# Patient Record
Sex: Male | Born: 1950 | Hispanic: Yes
Health system: Midwestern US, Academic
[De-identification: ages and names within clinical notes are randomized; demographics above are authoritative.]

---

## 2016-10-09 MED ORDER — CLARITHROMYCIN 500 MG PO TAB
500 mg | ORAL_TABLET | Freq: Two times a day (BID) | ORAL | 0 refills | Status: AC
Start: 2016-10-09 — End: ?

## 2016-10-09 MED ORDER — OMEPRAZOLE 20 MG PO CPDR
20 mg | ORAL_CAPSULE | Freq: Two times a day (BID) | ORAL | 0 refills | Status: AC
Start: 2016-10-09 — End: ?

## 2016-10-09 MED ORDER — AMOXICILLIN 500 MG PO CAP
1000 mg | ORAL_CAPSULE | Freq: Two times a day (BID) | ORAL | 0 refills | 7.00000 days | Status: AC
Start: 2016-10-09 — End: ?

## 2016-10-29 MED ORDER — OMEPRAZOLE 20 MG PO CPDR
ORAL_CAPSULE | Freq: Two times a day (BID) | 0 refills | Status: DC
Start: 2016-10-29 — End: 2016-10-30

## 2016-10-30 MED ORDER — OMEPRAZOLE 20 MG PO CPDR
20 mg | ORAL_CAPSULE | Freq: Every day | ORAL | 5 refills | Status: DC
Start: 2016-10-30 — End: 2018-12-31

## 2016-10-30 MED ORDER — SODIUM CHLORIDE 0.9 % IV SOLP
INTRAVENOUS | 0 refills | Status: CN
Start: 2016-10-30 — End: ?

## 2016-11-09 ENCOUNTER — Encounter: Admit: 2016-11-09 | Discharge: 2016-11-09 | Payer: MEDICARE

## 2016-11-09 ENCOUNTER — Ambulatory Visit: Admit: 2016-11-09 | Discharge: 2016-11-09 | Payer: MEDICARE

## 2016-11-09 DIAGNOSIS — W19XXXA Unspecified fall, initial encounter: ICD-10-CM

## 2016-11-09 DIAGNOSIS — E119 Type 2 diabetes mellitus without complications: Principal | ICD-10-CM

## 2016-11-09 DIAGNOSIS — H269 Unspecified cataract: ICD-10-CM

## 2016-11-09 DIAGNOSIS — E785 Hyperlipidemia, unspecified: ICD-10-CM

## 2016-11-09 DIAGNOSIS — E1142 Type 2 diabetes mellitus with diabetic polyneuropathy: Principal | ICD-10-CM

## 2016-11-09 DIAGNOSIS — M1990 Unspecified osteoarthritis, unspecified site: ICD-10-CM

## 2016-11-09 DIAGNOSIS — I10 Essential (primary) hypertension: ICD-10-CM

## 2016-11-09 NOTE — Progress Notes
Date of Service: 11/09/2016     Subjective:             Kevin Obrien is a 66 y.o. male presented to the diabetes clinic today for ongoing management of uncontrolled type 2 diabetes.  He follows [facility], from which he gets his insulin at a cheaper price.  His other medical problems include history of hypertension, hyperlipidemia and benign prostatic hypertrophy.     He was last seen by Dr Christianne Dolin 3 mo ago.       History of Present Illness    Type 2 diabetes mellitus  Diabetes was diagnosed around 2008.    He is currently on Lantus 60 units daily at bedtime, NovoLog 18 units with meals.  He previously was on metformin, however, stopped due to stomach upset as well as diarrhea.  Left side abd pain.  This keeps him up at night, and he is     He did not bring glucose meter for review today.    He has not been doing exercise due to neuropathy.  He has history of coronary artery disease status post stenting 2015, he denies strokes or peripheral vascular disease.    His last eye exam was around 6 months ago.  He does not have any kidney disease.  He reported significant symptoms of neuropathy today, he follows with Dr. Chales Abrahams time in the neurology clinic.    He continues to take Lipitor 40 mg daily, lisinopril 40 mg daily as well as aspirin 81 mg daily.     Review of Systems  A comprehensive 14 point review of system was obtained from patient today and was positive for neuropathy.remiander of review of systems was negative.     Past Medical History:   Diagnosis Date   ??? Accidental fall    ??? Arthritis    ??? Cataract    ??? DM (diabetes mellitus) (HCC)    ??? Hyperlipidemia    ??? Hypertension      Past Surgical History:   Procedure Laterality Date   ??? HX CHOLECYSTECTOMY  09/2015   ??? COLONOSCOPY N/A 04/20/2016    COLONOSCOPY performed by Vertell Novak, MD at [facility]   ??? UPPER GASTROINTESTINAL ENDOSCOPY N/A 04/20/2016    ESOPHAGOGASTRODUODENOSCOPY performed by Vertell Novak, MD at [facility] ??? UPPER GASTROINTESTINAL ENDOSCOPY  04/20/2016    ESOPHAGOGASTRODUODENOSCOPY BIOPSY performed by Vertell Novak, MD at [facility]     Family History   Problem Relation Age of Onset   ??? Diabetes Mother    ??? Diabetes Father    ??? Cancer-Lung Father    ??? Melanoma Neg Hx      Social History     Social History   ??? Marital status: Divorced     Spouse name: N/A   ??? Number of children: 3   ??? Years of education: N/A     Occupational History   ??? Not on file.     Social History Main Topics   ??? Smoking status: Former Smoker     Quit date: 04/12/1971   ??? Smokeless tobacco: Never Used   ??? Alcohol use No   ??? Drug use: No   ??? Sexual activity: No     Other Topics Concern   ??? Not on file     Social History Narrative   ??? No narrative on file     Objective:         ??? amLODIPine (NORVASC) 10 mg tablet Take 1 tablet by mouth  daily. (Patient taking differently: Take 5 mg by mouth daily.)   ??? aspirin 81 mg chewable tablet Take 81 mg by mouth daily.     ??? atorvastatin (LIPITOR) 40 mg tablet Take 40 mg by mouth daily.   ??? capsaicin 0.025 % topical cream Apply on the affected area for neuropathic pain.  Wash hands after application   ??? cholecalciferol (VITAMIN D-3) 1,000 units tablet Take 1,000 Units by mouth daily.   ??? duloxetine DR (CYMBALTA) 30 mg capsule Take 1 capsule by mouth twice daily.   ??? gabapentin (NEURONTIN) 800 mg tablet Take 1 tablet by mouth every 8 hours.   ??? glucose 4 gram chewable tablet Chew 4 tablets by mouth as Needed.   ??? hydrOXYzine (ATARAX) 25 mg tablet Take 25 mg by mouth three times daily as needed for Itching.   ??? ibuprofen (ADVIL) 200 mg tablet Take 200 mg by mouth every 6 hours as needed for Pain. Take with food.   ??? insulin aspart (NOVOLOG FLEXPEN) 100 unit/mL injection PEN Inject 18 Units under the skin three times daily with meals.   ??? insulin glargine (LANTUS) 100 unit/mL injection Inject 60 Units under the skin at bedtime daily. 1 vial contains 10mL ??? insulin pen needles (disposable) (PEN NEEDLE) 31 gauge x 5/16 pen needle Use 1 each as directed as Needed. Use with insulin pen. Can substitute for any approved insulin needle   ??? lisinopril (PRINIVIL, ZESTRIL) 40 mg tablet Take 40 mg by mouth daily.   ??? omeprazole DR(+) (PRILOSEC) 20 mg capsule Take 1 capsule by mouth daily before breakfast.   ??? tamsulosin (FLOMAX) 0.4 mg capsule TAKE ONE CAPSULE BY MOUTH ONCE DAILY.TAKE 30 MINUTES AFTER SAME MEAL. DO NOT CUT, CRUSH, CHEW.     Vitals:    11/09/16 1315   BP: 150/73   Pulse: 88   Weight: 66.5 kg (146 lb 9.6 oz)   Height: 167.6 cm (66)     Body mass index is 23.66 kg/m???.     Physical Exam    Vitals reviewed.  Constitutional:  oriented to person, place, and time. appears well-developed and well-nourished. No distress.   HENT:   Head: Normocephalic and atraumatic.   Mouth/Throat: Oropharynx is clear and moist.   Eyes: Conjunctivae normal   Neck: Normal range of motion. Neck supple.   Cardiovascular: Normal rate  Pulmonary/Chest: Effort normal   Musculoskeletal: exhibits no edema.   Neurological: alert and oriented to person, place, and time. Neuro exam is grossly unremarkable   Skin is warm and dry. No rash noted. No erythema.   Psychiatric: has a normal mood and affect. behavior is normal. Judgment and thought content normal.           Comprehensive Metabolic Profile    Lab Results   Component Value Date/Time    NA 134 (L) 06/20/2016 01:35 PM    K 4.0 06/20/2016 01:35 PM    CL 98 06/20/2016 01:35 PM    CO2 27 06/20/2016 01:35 PM    GAP 9 01/23/2016 02:55 PM    BUN 12 01/23/2016 02:55 PM    CR 0.89 01/23/2016 02:55 PM    GLU 288 (H) 01/23/2016 02:55 PM    Lab Results   Component Value Date/Time    CA 10.0 01/23/2016 02:55 PM    ALBUMIN 4.4 01/23/2016 02:55 PM    TOTPROT 7.3 01/23/2016 02:55 PM    ALKPHOS 69 01/23/2016 02:55 PM    AST 31 01/23/2016 02:55 PM  ALT 59 (H) 01/23/2016 02:55 PM    TOTBILI 0.7 01/23/2016 02:55 PM    GFR >60 01/23/2016 02:55 PM GFRAA >60 01/23/2016 02:55 PM        No results found for: MCALB24   Microalbumin/CR ratio Urine   Date Value Ref Range Status   09/14/2011 10.61 MG/GRAM Final     Microalbumin, Random   Date Value Ref Range Status   09/14/2011 7.0 <19 MCG/ML Final       Lab Results   Component Value Date    CHOL 159 09/01/2015    TRIG 299 (H) 09/01/2015    HDL 27 (L) 09/01/2015    LDL 84 09/01/2015    VLDL 60 09/01/2015    NONHDLCHOL 132 09/01/2015        TSH   Date Value Ref Range Status   09/01/2015 0.851 0.35 - 5.00 MCU/ML Final       Hemoglobin A1C   Date Value Ref Range Status   09/01/2015 12.4 (H) 4.0 - 6.0 % Final     Comment:     The ADA recommends that most patients with type 1 and type 2 diabetes maintain   an A1c level <7%.       Poc Hemoglobin A1C   Date Value Ref Range Status   11/12/2016 11.9  Final   05/24/2016 9.2  Final   02/17/2016 7.9  Final             Assessment and Plan:    Diabetes mellitus type 2,  Un- controlled   ??? Hemoglobin A1c was 11.9 today, was 9.2% in Dec 2017, Target A1c less than or equal to 7% without hypoglycemia.  ??? His blood glucose was elevated today 372   ??? He is currently on Lantus 60 units daily at bedtime, NovoLog 18 units with meals.    Plan :   ??? Increase to Lantus 34 units BID  ??? Stressed the importance of taking NovoLog to 18 units with meals,and if he continues to have prandial hyperglycemia to increase further to 20 units with meals.   Check glucose 4 times daily  Meet with educator in July, as he will likely need to make further adjustments.            Diabetic complication assessment:   ??? Eye exam : 6 months ago  ??? Urine microalbumin/Cr was no in 2013, ordered in the past but he has not completed  ??? Annual labs (electrolytes and renal function): Up-to-date normal renal function in Jan 2018.  ???  Thyroid function testing : Normal March 2017.    Neuropathy  ??? Gabapentin 800 mg TID  ??? Cymbalta 30 mg twice daily that was prescribed by his neurologist Dr. Chales Abrahams in Jan Hyperlipidemia   ??? Had lipid profile in March 2017, that was consistent with hypertriglyceridemia and low HDL which will improve with better diabetes control.   ??? Continue current Lipitor dose 40 mg daily    Hypertension   ??? Stable today   ??? He is on ACEI. Lisinopril 40 mg     Return to clinic in 3 months      Total time 25 minutes.  Estimated counseling time 15 minutes.  Counseled patient regarding diabetes management.      Orders Placed This Encounter   ??? POC HEMOGLOBIN A1C   ??? POC GLUCOSE QUANTITATIVE BLOOD     Patient Instructions     Goals we discussed today:     Novolog 18 units with  each meal    Take Lantus 34 units twice daily    Check blood sugar 4 times daily    Please contact [facility] for any questions or abnormal glucose values (above 300 or less than 70 repeatedly).  [phone].  My nurse can be reached at [phone].      Health goals for a person with diabetes to avoid complications are:    Check your FingerStick blood glucose:  Each time you take medications for your glucose, goals are: Fasting and pre-meal glucose:  70-130 mg/dl, Bedtime blood glucose: 90-180 mg/dl    LDL Cholesterol:  <295 mg/dl, Triglycerides: <621 mg/dl, HDL >30 for men, >86 for women Blood pressure:  Less than 130/90 mm HG  Maintain a Healthy Weight , Exercise: 30 minutes 5 times per week    Foot care:  Take good care of your feet.  Never cut your nails close to the tips of the toes. Promptly call your doctor if you have an infection, ulcer or cut anywhere on your feet. Wear shoes with a wide toe box and that fit comfortably.  Avoid walking barefoot.     Your recent lab values:    Glucose, POC   Date/Time Value Ref Range Status   08/06/2016 299  Final     Cholesterol   Date/Time Value Ref Range Status   09/01/2015 06:48 AM 159 <200 MG/DL Final     HDL   Date/Time Value Ref Range Status   09/01/2015 06:48 AM 27 (L) >40 MG/DL Final     LDL   Date/Time Value Ref Range Status 09/01/2015 06:48 AM 84 <100 MG/DL Final     Triglycerides   Date/Time Value Ref Range Status   09/01/2015 06:48 AM 299 (H) <150 MG/DL Final          Diabetes Support Group  Monthly DM Support Group facilitated by CDE. There is no fee or  Registration required for attendance. Meets the first Wednesday of each month from 6-7:30pm at the [facility], [address], [place], [place]  [address].    [facility] Education Opportunities    To schedule Diabetes Education please call [phone], Option 3  Contact your insurance company to determine coverage for diabetes education.          Future Appointments  Date Time Provider Department Center   11/24/2016 9:30 AM MRI - HOSPITAL ROOM 2 (1.5T) MR Radiology   12/07/2016 10:30 AM Evelina Bucy, MD [facility] SPINE   12/10/2016 1:30 PM Dustin Flock, RN [facility]   05/06/2017 9:00 AM Tonny Branch, MD [facility]

## 2016-11-13 ENCOUNTER — Encounter: Admit: 2016-11-13 | Discharge: 2016-11-13 | Payer: Medicare Other

## 2016-11-13 ENCOUNTER — Encounter: Admit: 2016-11-13 | Discharge: 2016-11-13 | Payer: MEDICARE

## 2016-11-13 DIAGNOSIS — W19XXXA Unspecified fall, initial encounter: ICD-10-CM

## 2016-11-13 DIAGNOSIS — M1990 Unspecified osteoarthritis, unspecified site: ICD-10-CM

## 2016-11-13 DIAGNOSIS — E785 Hyperlipidemia, unspecified: ICD-10-CM

## 2016-11-13 DIAGNOSIS — E119 Type 2 diabetes mellitus without complications: Principal | ICD-10-CM

## 2016-11-13 DIAGNOSIS — I10 Essential (primary) hypertension: ICD-10-CM

## 2016-11-13 DIAGNOSIS — H269 Unspecified cataract: ICD-10-CM

## 2016-11-14 ENCOUNTER — Encounter: Admit: 2016-11-14 | Discharge: 2016-11-14 | Payer: MEDICARE

## 2016-11-14 NOTE — Telephone Encounter
Call pt and let him know H. pylori breath test negative. See Pain Management 12/07/16 for abdominal pain and continue follow up with Neurology. Call or return to GI prn.

## 2016-11-15 ENCOUNTER — Encounter: Admit: 2016-11-15 | Discharge: 2016-11-15 | Payer: MEDICARE

## 2016-11-15 NOTE — Telephone Encounter
Attempted to contact pt. Voicemail box not set up. Message sent to pt via MyChart.

## 2016-11-23 ENCOUNTER — Encounter: Admit: 2016-11-23 | Discharge: 2016-11-23 | Payer: Medicare Other

## 2016-11-24 ENCOUNTER — Ambulatory Visit: Admit: 2016-11-24 | Discharge: 2016-11-24 | Payer: MEDICARE

## 2016-11-24 DIAGNOSIS — M5417 Radiculopathy, lumbosacral region: Principal | ICD-10-CM

## 2016-12-03 ENCOUNTER — Encounter: Admit: 2016-12-03 | Discharge: 2016-12-03 | Payer: MEDICARE

## 2016-12-07 ENCOUNTER — Encounter: Admit: 2016-12-07 | Discharge: 2016-12-07 | Payer: MEDICARE

## 2016-12-07 DIAGNOSIS — H269 Unspecified cataract: ICD-10-CM

## 2016-12-07 DIAGNOSIS — M1990 Unspecified osteoarthritis, unspecified site: ICD-10-CM

## 2016-12-07 DIAGNOSIS — W19XXXA Unspecified fall, initial encounter: ICD-10-CM

## 2016-12-07 DIAGNOSIS — I10 Essential (primary) hypertension: ICD-10-CM

## 2016-12-07 DIAGNOSIS — E119 Type 2 diabetes mellitus without complications: Principal | ICD-10-CM

## 2016-12-07 DIAGNOSIS — E785 Hyperlipidemia, unspecified: ICD-10-CM

## 2016-12-07 MED ORDER — NORTRIPTYLINE 25 MG PO CAP
75 mg | ORAL_CAPSULE | Freq: Every evening | ORAL | 5 refills | Status: AC
Start: 2016-12-07 — End: 2018-12-31

## 2016-12-07 NOTE — Progress Notes
[facility]  NEW PATIENT HISTORY AND PHYSICAL  Subjective     Chief Complaint: Multifocal pain    HPI: Kevin Obrien is a 66 y.o. male who  has a past medical history of Accidental fall; Arthritis; Cataract; DM (diabetes mellitus) (HCC); Hyperlipidemia; and Hypertension. who now presents for initial consultation.    The pain is in the LUQ of the abdomen. The skin there is hypersensitive. He reports allodynia. He cannot bear his shirt touching that area.   There is also itchiness in the bilateral lateral aspects of the thighs. He states that rubbing alcohol on the area results in transient relief.     Pain started: Mid 2017    Initial inciting injury or event: Follow cholecsystectomy    Numbness/tingling: No    The pain ranges 4-8/10    The pain is described as stabbing, burning.     The pain is exacerbated by contact with fabric, touch, eating.     He has had extensive workup with surgeon and GI that has not yielded an etiology per the patient.     The pain is partially alleviated by reclining in a chair.          PRIOR MEDICATIONS:   Effective      Ineffective  Gabapentin  Cymbalta  Capsaicin oint  NSAID  Acetaminophen    Unable to tolerate      Never  Lyrica  Ami/Nortriptyline  Tizanidine      PRIOR INTERVENTIONS:  No spine surgery or injections  Effective      Ineffective  Exercise, pain limited        Kevin Obrien denies any recent fevers, chills, infection, antibiotics, bowel or bladder incontinence, saddle anesthesia, bleeding issues, or recent anticoagulant.     ROS: All 14 systems reviewed and found to be negative except as above and as follows. +congestion, abd pain, diarrhea, dizziness.     Past Medical History:  Past Medical History:   Diagnosis Date   ??? Accidental fall    ??? Arthritis    ??? Cataract    ??? DM (diabetes mellitus) (HCC)    ??? Hyperlipidemia    ??? Hypertension        Family History:  Family History   Problem Relation Age of Onset   ??? Diabetes Mother ??? Diabetes Father    ??? Cancer-Lung Father    ??? Melanoma Neg Hx        Social History:  Lives in [place], [place].   Social History     Social History   ??? Marital status: Divorced     Spouse name: N/A   ??? Number of children: 3   ??? Years of education: N/A     Occupational History   ??? Not on file.     Social History Main Topics   ??? Smoking status: Former Smoker     Quit date: 04/12/1971   ??? Smokeless tobacco: Never Used   ??? Alcohol use No   ??? Drug use: No   ??? Sexual activity: No     Other Topics Concern   ??? Not on file     Social History Narrative   ??? No narrative on file       Allergies:  No Known Allergies    Medications:    Current Outpatient Prescriptions:   ???  amLODIPine (NORVASC) 10 mg tablet, Take 1 tablet by mouth daily. (Patient taking differently: Take 5 mg by mouth  daily.), Disp: 90 tablet, Rfl: 3  ???  aspirin 81 mg chewable tablet, Take 81 mg by mouth daily.  , Disp: , Rfl:   ???  atorvastatin (LIPITOR) 40 mg tablet, Take 40 mg by mouth daily., Disp: , Rfl:   ???  capsaicin 0.025 % topical cream, Apply on the affected area for neuropathic pain. Wash hands after application, Disp: 21 g, Rfl: 3  ???  cholecalciferol (VITAMIN D-3) 1,000 units tablet, Take 1,000 Units by mouth daily., Disp: , Rfl:   ???  duloxetine DR (CYMBALTA) 30 mg capsule, Take 1 capsule by mouth twice daily., Disp: 60 capsule, Rfl: 3  ???  gabapentin (NEURONTIN) 800 mg tablet, Take 1 tablet by mouth every 8 hours., Disp: 270 tablet, Rfl: 3  ???  glucose 4 gram chewable tablet, Chew 4 tablets by mouth as Needed., Disp: 50 tablet, Rfl: 3  ???  hydrOXYzine (ATARAX) 25 mg tablet, Take 25 mg by mouth three times daily as needed for Itching., Disp: , Rfl:   ???  ibuprofen (ADVIL) 200 mg tablet, Take 200 mg by mouth every 6 hours as needed for Pain. Take with food., Disp: , Rfl:   ???  insulin aspart (NOVOLOG FLEXPEN) 100 unit/mL injection PEN, Inject 18 Units under the skin three times daily with meals., Disp: 3 Package, Rfl: 3 ???  insulin glargine (LANTUS) 100 unit/mL injection, Inject 60 Units under the skin at bedtime daily. 1 vial contains 10mL, Disp: 3 vial, Rfl: 11  ???  insulin pen needles (disposable) (PEN NEEDLE) 31 gauge x 5/16 pen needle, Use 1 each as directed as Needed. Use with insulin pen. Can substitute for any approved insulin needle, Disp: 300 each, Rfl: 3  ???  lisinopril (PRINIVIL, ZESTRIL) 40 mg tablet, Take 40 mg by mouth daily., Disp: , Rfl:   ???  omeprazole DR(+) (PRILOSEC) 20 mg capsule, Take 1 capsule by mouth daily before breakfast., Disp: 30 capsule, Rfl: 5  ???  tamsulosin (FLOMAX) 0.4 mg capsule, TAKE ONE CAPSULE BY MOUTH ONCE DAILY.TAKE 30 MINUTES AFTER SAME MEAL. DO NOT CUT, CRUSH, CHEW., Disp: 90 capsule, Rfl: 3    Physical examination:   BP 143/74 (BP Source: Arm, Left Upper, Patient Position: Sitting)  - Pulse 84  - Resp 16  - Ht 167.6 cm (66)  - Wt 62.6 kg (138 lb)  - SpO2 100%  - BMI 22.27 kg/m???   Pain Score: Eight (RT LUQ )    General: The patient is a well-developed, well nourished 66 y.o. male in no acute distress.   HEENT: Head is normocephalic and atraumatic. Pupils are equal and reactive to light bilaterally.   Cardiac: Based on palpation, pulse appears to be regular rate and rhythm.   Pulmonary: The patient has unlabored respirations and bilateral symmetric chest excursion.   Abdomen: Soft, nontender, and nondistended except for sensitivity and allodynia at the left subcostal margin extending towards the lateral margin. There is no rebound. Mild guarding. +Carnett's sign.   Extremities: No clubbing, cyanosis, or edema.     Neurologic:   The patient is alert and oriented times 3.   Cranial nerves II through XII are intact without any focal deficits.   There is no sensory deficit to light touch or pinprick in the affected areas.     Musculoskeletal:   Gait is normal.    L-Spine   There is no point vertebral tenderness in the midline. There is no paraspinal tenderness. Paraspinal muscle tone is normal.  Facet loading is  negative.  There is no tenderness or radiating pain with palpation over the SI joints, piriformis, or greater trochanteric bursae bilaterally.  ROM with flexion, extension, rotation, and lateral bending is intact.  Strength is equal and adequate bilaterally in the flexors and extensors of the bilateral lower extremities.   Reflexes are 2/4 at the patella and achilles bilaterally.   SLR is negative bilaterally.      MRI L-Spine Results:    Results for orders placed during the hospital encounter of 11/24/16   MRI L-SPINE WO CONTRAST    Narrative MRI L-SPINE WO CONTRAST    Clinical indication: Lumbosacral radiculopathy.    Technique: MRI of the lumbar spine is performed without contrast.    Contrast: None    Comparison: CTA of the abdomen and pelvis from March 01, 2016    Findings:    There is straightening of lumbar lordosis. The vertebral body heights are well-maintained without a suspicious geographic marrow lesion. There is disc degeneration and discogenic endplate changes from L3 through S1. The conus terminates normally. The nerve roots of the cauda equina are normal in appearance. The prevertebral and paraspinal soft tissues are unremarkable.    L1-L2: Normal.    L2-L3: No spinal canal or neural foraminal narrowing.    L3-L4: Symmetrically bulging disc and prominent posterior epidural fat in mild spinal canal and bilateral neural foraminal narrowing.    L4-L5: Symmetrically bulging disc with prominent posterior epidural fat, facet arthropathy, and ligamentum flavum laxity results in moderate spinal canal and right neural foraminal narrowing. There is mild left neural foraminal compromise.    L5-S1: Symmetrically bulging disc and facet arthropathy results in mild right subarticular zone and moderate bilateral neural foraminal narrowing.      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       Finalized by Wandra Mannan, M.D. on 11/26/2016 8:02 AM. Dictated by Wandra Mannan, M.D. on 11/26/2016 7:57 AM.         Last Cr and LFT's:  Creatinine   Date Value Ref Range Status   01/23/2016 0.89 0.4 - 1.24 MG/DL Final     AST (SGOT)   Date Value Ref Range Status   01/23/2016 31 7 - 40 U/L Final     ALT (SGPT)   Date Value Ref Range Status   01/23/2016 59 (H) 7 - 56 U/L Final     Alk Phosphatase   Date Value Ref Range Status   01/23/2016 69 25 - 110 U/L Final     Total Bilirubin   Date Value Ref Range Status   01/23/2016 0.7 0.3 - 1.2 MG/DL Final          Assessment:    Kevin Obrien is a 66 y.o. male who  has a past medical history of Accidental fall; Arthritis; Cataract; DM (diabetes mellitus) (HCC); Hyperlipidemia; and Hypertension. who presents for evaluation of multifocal pain.     The pain complaints are most likely due to:    1. Neuropathic pain  [place] AMB NERVE BLOCK   2. Allodynia  [place] AMB NERVE BLOCK   3. Abdominal pain, left upper quadrant  [place] AMB NERVE BLOCK   4. Lumbar radiculopathy  [place] AMB NERVE BLOCK   5. DDD (degenerative disc disease), lumbar  [place] AMB NERVE BLOCK  Patient has had an adequate trial of > 3 months of rest, exercise, NSAID's, multimodal treatment, and the passage of time without improvement of symptoms. The pain has significant impact on the daily quality of life.     Plan:    1. Will trial Nortriptyline 25mg  up to 75mg  qhs instead of Cymbalta.   2. If this is ineffective, will transition from Gabapentin to Lyrica 75mg  tid to start.   3. Plan for Bilateral TAP block with ultrasound in clinic.   4. Given bilateral lateral thigh symptoms and MRI findings, Bilateral L4-5 TFESI would be reasonable.   5. If he fails to improve, will refer to [facility]. 6. Follow up as needed.     Risks/benefits of all pharmacologic and interventional treatments discussed and questions answered.     Thank you for this kind referral for consultation. Please feel free to contact me with any questions or concerns.

## 2016-12-08 ENCOUNTER — Ambulatory Visit: Admit: 2016-12-07 | Discharge: 2016-12-08 | Payer: MEDICARE

## 2016-12-08 DIAGNOSIS — M5416 Radiculopathy, lumbar region: ICD-10-CM

## 2016-12-08 DIAGNOSIS — R1012 Left upper quadrant pain: Principal | ICD-10-CM

## 2016-12-08 DIAGNOSIS — R208 Other disturbances of skin sensation: ICD-10-CM

## 2016-12-08 DIAGNOSIS — M792 Neuralgia and neuritis, unspecified: ICD-10-CM

## 2016-12-08 DIAGNOSIS — M5136 Other intervertebral disc degeneration, lumbar region: ICD-10-CM

## 2016-12-28 ENCOUNTER — Encounter: Admit: 2016-12-28 | Discharge: 2016-12-28 | Payer: MEDICARE

## 2016-12-28 DIAGNOSIS — M1990 Unspecified osteoarthritis, unspecified site: ICD-10-CM

## 2016-12-28 DIAGNOSIS — E119 Type 2 diabetes mellitus without complications: Principal | ICD-10-CM

## 2016-12-28 DIAGNOSIS — I10 Essential (primary) hypertension: ICD-10-CM

## 2016-12-28 DIAGNOSIS — E785 Hyperlipidemia, unspecified: ICD-10-CM

## 2016-12-28 DIAGNOSIS — W19XXXA Unspecified fall, initial encounter: ICD-10-CM

## 2016-12-28 DIAGNOSIS — H269 Unspecified cataract: ICD-10-CM

## 2016-12-28 NOTE — Procedures
Attending Surgeon: Evelina Bucy, MD    Anesthesia: Local    Pre-Procedure Diagnosis:   1. Neuropathic pain    2. Allodynia    3. Abdominal pain, left upper quadrant    4. Lumbar radiculopathy    5. DDD (degenerative disc disease), lumbar        Post-Procedure Diagnosis:   1. Neuropathic pain    2. Allodynia    3. Abdominal pain, left upper quadrant    4. Lumbar radiculopathy    5. DDD (degenerative disc disease), lumbar        [place] AMB NERVE BLOCK  Nerve: abdominal cutaneous  Laterality: bilateral    Consent:   Consent obtained: written  Consent given by: patient  Alternatives discussed: no treatment, delayed treatment and alternative treatment  Discussed with patient the purpose of the treatment/procedure, other ways of treating my condition, including no treatment/ procedure and the risks and benefits of the alternatives. Patient has decided to proceed with treatment/procedure.        Universal Protocol:  Relevant documents: relevant documents present and verified  Site marked: the operative site was marked  Patient identity confirmed: Patient identify confirmed verbally with patient.      Time out: Immediately prior to procedure a time out was called to verify the correct patient, procedure, equipment, support staff and site/side marked as required        Procedures Details:   Indications: Neuritis and Pain Relief  Preparation: Patient was prepped and draped in the usual sterile fashion.  Prep: 2% chlorhexidine  Patient position: supine  Needle gauge: 22ga.  Guidance: ultrasound  Justification for use of ultrasound guidance: The use of direct sonographic visualization of the needle was required to ensure accurate injection placement for diagnostic specificity, to maximize clinical efficacy and for safety purposes to minimize risk of bleeding or injury to nearby neurovascular structures  Outcome: Pain relieved  Patient tolerance: tolerated well, no immediate complications  Comments: PROCEDURE: bilateral Transverse Abdominis Plexus Block (TAP) with Ultrasound needle guidance    PREPROCEDURE DIAGNOSES: Abdominal pain and neuropathic pain.    POSTPROCEDURE DIAGNOSES: Abdominal pain and neuropathic pain.    SURGEON: Evelina Bucy, MD    MEDICATION INJECTED: 8mL of 50:50 solution of Lidocaine 2% and Bupivacaine 0.5% and 20mg  of Kenalog each side.    LOCAL ANESTHETIC: As above.    SEDATION: None.    ESTIMATED BLOOD LOSS: None.    SPECIMENS REMOVED: None    COMPLICATIONS: None.    TECHNIQUE: A time-out was taken to identify the correct patient, procedure, and site prior to starting the procedure. A consent was signed and placed in the chart. Lying in a supine position, the patient was prepped in sterile fashion using DuraPrep. The area to be injected was determined using Ultrasound guidance. A 22-gauge, 3.5-inch spinal needle was advanced to the border between the internal oblique and the transverse abdominus muscle under ultrasound visualization. Following negative aspiration initially and intermittently, the solution was injected with excellent spread in the desired plane.     The procedure was completed without complications and was tolerated well. The patient was monitored after the procedure. The patient was given postprocedure and discharge instructions to follow at home. The patient was discharged in stable condition.           Estimated blood loss: none or minimal  Specimens: none  Patient tolerated the procedure well with no immediate complications. Pressure was applied, and hemostasis was accomplished.

## 2016-12-29 ENCOUNTER — Ambulatory Visit: Admit: 2016-12-28 | Discharge: 2016-12-29 | Payer: MEDICARE

## 2016-12-29 DIAGNOSIS — M5136 Other intervertebral disc degeneration, lumbar region: ICD-10-CM

## 2016-12-29 DIAGNOSIS — R208 Other disturbances of skin sensation: ICD-10-CM

## 2016-12-29 DIAGNOSIS — M792 Neuralgia and neuritis, unspecified: Principal | ICD-10-CM

## 2016-12-29 DIAGNOSIS — R1012 Left upper quadrant pain: ICD-10-CM

## 2016-12-29 DIAGNOSIS — M5416 Radiculopathy, lumbar region: ICD-10-CM

## 2017-05-01 ENCOUNTER — Encounter: Admit: 2017-05-01 | Discharge: 2017-05-01 | Payer: MEDICARE

## 2017-05-01 DIAGNOSIS — R1084 Generalized abdominal pain: Principal | ICD-10-CM

## 2017-05-06 ENCOUNTER — Encounter: Admit: 2017-05-06 | Discharge: 2017-05-06 | Payer: MEDICARE

## 2017-05-06 ENCOUNTER — Ambulatory Visit: Admit: 2017-05-06 | Discharge: 2017-05-07 | Payer: MEDICARE

## 2017-05-06 ENCOUNTER — Ambulatory Visit: Admit: 2017-05-06 | Discharge: 2017-05-06 | Payer: MEDICARE

## 2017-05-06 ENCOUNTER — Encounter: Admit: 2017-05-06 | Discharge: 2017-05-06 | Payer: Medicare Other

## 2017-05-06 DIAGNOSIS — H269 Unspecified cataract: ICD-10-CM

## 2017-05-06 DIAGNOSIS — E1159 Type 2 diabetes mellitus with other circulatory complications: ICD-10-CM

## 2017-05-06 DIAGNOSIS — E119 Type 2 diabetes mellitus without complications: Principal | ICD-10-CM

## 2017-05-06 DIAGNOSIS — I10 Essential (primary) hypertension: ICD-10-CM

## 2017-05-06 DIAGNOSIS — E1142 Type 2 diabetes mellitus with diabetic polyneuropathy: Principal | ICD-10-CM

## 2017-05-06 DIAGNOSIS — R739 Hyperglycemia, unspecified: ICD-10-CM

## 2017-05-06 DIAGNOSIS — M792 Neuralgia and neuritis, unspecified: ICD-10-CM

## 2017-05-06 DIAGNOSIS — E785 Hyperlipidemia, unspecified: ICD-10-CM

## 2017-05-06 DIAGNOSIS — E782 Mixed hyperlipidemia: ICD-10-CM

## 2017-05-06 DIAGNOSIS — W19XXXA Unspecified fall, initial encounter: ICD-10-CM

## 2017-05-06 DIAGNOSIS — M1990 Unspecified osteoarthritis, unspecified site: ICD-10-CM

## 2017-05-06 LAB — COMPREHENSIVE METABOLIC PANEL
Lab: 0.7 mg/dL (ref 0.3–1.2)
Lab: 0.9 mg/dL (ref 0.4–1.24)
Lab: 134 MMOL/L — ABNORMAL LOW (ref 137–147)
Lab: 21 U/L (ref 7–40)
Lab: 23 U/L (ref 7–56)
Lab: 27 MMOL/L (ref 21–30)
Lab: 399 mg/dL — ABNORMAL HIGH (ref 70–100)
Lab: 4.1 MMOL/L (ref 3.5–5.1)
Lab: 4.4 g/dL (ref 3.5–5.0)
Lab: 7.6 g/dL (ref 6.0–8.0)
Lab: 70 U/L (ref 25–110)
Lab: 9 mg/dL (ref 7–25)
Lab: 9.9 mg/dL (ref 8.5–10.6)
Lab: >60 mL/min (ref >60)
Lab: >60 mL/min (ref >60)
Lab: 11 (ref 3–12)

## 2017-05-06 LAB — MICROALB/CR RATIO-URINE RANDOM
Lab: 268 ug/mL — ABNORMAL HIGH (ref <19)
Lab: 285 ug/mg — ABNORMAL HIGH (ref <30)
Lab: 94 mg/dL

## 2017-05-06 LAB — TSH WITH FREE T4 REFLEX: Lab: 1 uU/mL — ABNORMAL LOW (ref 0.35–5.00)

## 2017-05-06 MED ORDER — SEMAGLUTIDE 1 MG/0.75 ML (2 MG/1.5 ML) SC PNIJ
1 mg | SUBCUTANEOUS | 3 refills | Status: AC
Start: 2017-05-06 — End: 2018-01-10

## 2017-05-06 MED ORDER — LIDOCAINE (PF) 20 MG/ML (2 %) IJ SOLN
5 mL | Freq: Once | INTRAMUSCULAR | 0 refills | Status: CP
Start: 2017-05-06 — End: ?
  Administered 2017-05-06: 21:00:00 100 mg via INTRAMUSCULAR

## 2017-05-06 MED ORDER — BUPIVACAINE (PF) 0.25 % (2.5 MG/ML) IJ SOLN
10 mL | Freq: Once | INTRAMUSCULAR | 0 refills | Status: CP
Start: 2017-05-06 — End: ?
  Administered 2017-05-06: 21:00:00 10 mL via INTRAMUSCULAR

## 2017-05-06 MED ORDER — LIDOCAINE (PF) 10 MG/ML (1 %) IJ SOLN
3 mL | Freq: Once | INTRAMUSCULAR | 0 refills | Status: CP
Start: 2017-05-06 — End: ?
  Administered 2017-05-06: 21:00:00 3 mL via INTRAMUSCULAR

## 2017-05-06 MED ORDER — TRIAMCINOLONE ACETONIDE 40 MG/ML IJ SUSP
80 mg | Freq: Once | EPIDURAL | 0 refills | Status: CP
Start: 2017-05-06 — End: ?
  Administered 2017-05-06: 21:00:00 40 mg via EPIDURAL

## 2017-05-06 NOTE — Progress Notes
[facility]  INTERVENTIONAL PAIN PROCEDURE HISTORY AND PHYSICAL    Chief Complaint   Patient presents with   ??? Lower Back - Pain       HISTORY OF PRESENT ILLNESS:  Abdominal pain. Good results with TAP block previously.     Past Medical History:   Diagnosis Date   ??? Accidental fall    ??? Arthritis    ??? Cataract    ??? DM (diabetes mellitus) (HCC)    ??? Hyperlipidemia    ??? Hypertension        Past Surgical History:   Procedure Laterality Date   ??? HX CHOLECYSTECTOMY  09/2015   ??? COLONOSCOPY N/A 04/20/2016    COLONOSCOPY performed by Vertell Novak, MD at [facility]   ??? UPPER GASTROINTESTINAL ENDOSCOPY N/A 04/20/2016    ESOPHAGOGASTRODUODENOSCOPY performed by Vertell Novak, MD at [facility]   ??? UPPER GASTROINTESTINAL ENDOSCOPY  04/20/2016    ESOPHAGOGASTRODUODENOSCOPY BIOPSY performed by Vertell Novak, MD at [facility]       family history includes Cancer-Lung in his father; Diabetes in his father and mother.    Social History     Social History   ??? Marital status: Divorced     Spouse name: N/A   ??? Number of children: 3   ??? Years of education: N/A     Occupational History   ??? Not on file.     Social History Main Topics   ??? Smoking status: Former Smoker     Quit date: 04/12/1971   ??? Smokeless tobacco: Never Used   ??? Alcohol use No   ??? Drug use: No   ??? Sexual activity: Not on file     Other Topics Concern   ??? Not on file     Social History Narrative   ??? No narrative on file       No Known Allergies    Vitals:    05/06/17 1307   BP: 172/88   Pulse: 112   Resp: 16   SpO2: 99%   Weight: 65.3 kg (144 lb)   Height: 167.6 cm (66)       REVIEW OF SYSTEMS: 10 point ROS obtained and negative       PHYSICAL EXAM:  General: The patient is a well-developed, well nourished 66 y.o. male in no acute distress.   HEENT: Head is normocephalic and atraumatic. Pupils are equal and reactive to light bilaterally.   Cardiac: Based on palpation, pulse appears to be regular rate and rhythm. Pulmonary: The patient has unlabored respirations and bilateral symmetric chest excursion.   Abdomen: Soft, nontender, and nondistended except for sensitivity/allodynia at the left subcostal margin extending towards the lateral margin. There is no rebound. +Carnett's sign.   Extremities: No clubbing, cyanosis, or edema.     Neurologic:   The patient is alert and oriented times 3.     Musculoskeletal:     L-Spine   There is no paraspinal tenderness. Paraspinal muscle tone is normal.  ROM with flexion, extension, rotation, and lateral bending is intact.  SLR is negative bilaterally.          IMPRESSION:    1. Generalized abdominal pain         PLAN: Other Bilateral TAP Block

## 2017-05-06 NOTE — Procedures
Attending Surgeon: Evelina Bucy, MD    Anesthesia: Local    Pre-Procedure Diagnosis:   1. Generalized abdominal pain    2. Neuropathic pain    3. Type 2 diabetes mellitus without complication, with long-term current use of insulin (HCC)        Post-Procedure Diagnosis:   1. Generalized abdominal pain    2. Neuropathic pain    3. Type 2 diabetes mellitus without complication, with long-term current use of insulin (HCC)        [place] AMB NERVE BLOCK  Nerve: abdominal cutaneous  Laterality: bilateral    Consent:   Consent obtained: written  Consent given by: patient  Alternatives discussed: no treatment, delayed treatment and alternative treatment  Discussed with patient the purpose of the treatment/procedure, other ways of treating my condition, including no treatment/ procedure and the risks and benefits of the alternatives. Patient has decided to proceed with treatment/procedure.        Universal Protocol:  Relevant documents: relevant documents present and verified  Site marked: the operative site was marked  Patient identity confirmed: Patient identify confirmed verbally with patient.      Time out: Immediately prior to procedure a time out was called to verify the correct patient, procedure, equipment, support staff and site/side marked as required        Procedures Details:   Indications: Neuritis and Pain Relief  Preparation: Patient was prepped and draped in the usual sterile fashion.  Prep: 2% chlorhexidine  Patient position: supine  Needle gauge: 22ga.  Guidance: ultrasound  Justification for use of ultrasound guidance: The use of direct sonographic visualization of the needle was required to ensure accurate injection placement for diagnostic specificity, to maximize clinical efficacy and for safety purposes to minimize risk of bleeding or injury to nearby neurovascular structures  Outcome: Pain relieved  Patient tolerance: tolerated well, no immediate complications  Comments: PROCEDURE: bilateral Transverse Abdominis Plexus Block (TAP) with Ultrasound needle guidance    PREPROCEDURE DIAGNOSES: Abdominal pain and neuropathic pain.    POSTPROCEDURE DIAGNOSES: Abdominal pain and neuropathic pain.    SURGEON: Evelina Bucy, MD    MEDICATION INJECTED: 8mL of 50:50 solution of Lidocaine 2% and Bupivacaine 0.5% and 20mg  of Kenalog each side.    LOCAL ANESTHETIC: As above.    SEDATION: None.    ESTIMATED BLOOD LOSS: None.    SPECIMENS REMOVED: None    COMPLICATIONS: None.    TECHNIQUE: A time-out was taken to identify the correct patient, procedure, and site prior to starting the procedure. A consent was signed and placed in the chart. Lying in a supine position, the patient was prepped in sterile fashion using DuraPrep. The area to be injected was determined using Ultrasound guidance. A 22-gauge, 3.5-inch spinal needle was advanced to the border between the internal oblique and the transverse abdominus muscle under ultrasound visualization. Following negative aspiration initially and intermittently, the solution was injected with excellent spread in the desired plane.     The procedure was completed without complications and was tolerated well. The patient was monitored after the procedure. The patient was given postprocedure and discharge instructions to follow at home. The patient was discharged in stable condition.           Estimated blood loss: none or minimal  Specimens: none  Patient tolerated the procedure well with no immediate complications. Pressure was applied, and hemostasis was accomplished.

## 2017-05-06 NOTE — Progress Notes
Date of Service: 05/06/2017     Subjective:             Kevin Obrien is a 66 y.o. male presented to the diabetes clinic today for ongoing management of uncontrolled type 2 diabetes.  He follows with [facility], from which he gets his insulin at a cheaper price.  His other medical problems include history of hypertension, hyperlipidemia and benign prostatic hypertrophy.     History of Present Illness    Type 2 diabetes mellitus  Diabetes was diagnosed around 2008.    He is currently on Lantus 34 units twice daily, NovoLog 16 units with meals.  He reported that he does not take his NovoLog with breakfast and lunch when he is outside the home walking.  He usually takes his dose with dinner.    He reported checking his glucose at home and is usually in the 300s at all times.  He denied hypoglycemia.  Previously he tried taking metformin and stopped it due to stomach upset and diarrhea.  He did not bring his glucose meter to the appointment today.    He has history of coronary artery disease status post coronary stenting 2015.  His last eye exam was 1 year ago, he denied any retinopathy.  He is due for an eye exam.  He has neuropathy and symptoms currently controlled.  He continues to take Lipitor 40 mg daily.  He is also on lisinopril    .   Review of Systems  A comprehensive 14 point review of system was obtained from patient today and was positive for neuropathy.remiander of review of systems was negative.     Past Medical History:   Diagnosis Date   ??? Accidental fall    ??? Arthritis    ??? Cataract    ??? DM (diabetes mellitus) (HCC)    ??? Hyperlipidemia    ??? Hypertension      Past Surgical History:   Procedure Laterality Date   ??? HX CHOLECYSTECTOMY  09/2015   ??? COLONOSCOPY N/A 04/20/2016    COLONOSCOPY performed by Vertell Novak, MD at [facility]   ??? UPPER GASTROINTESTINAL ENDOSCOPY N/A 04/20/2016    ESOPHAGOGASTRODUODENOSCOPY performed by Vertell Novak, MD at [facility] ??? UPPER GASTROINTESTINAL ENDOSCOPY  04/20/2016    ESOPHAGOGASTRODUODENOSCOPY BIOPSY performed by Vertell Novak, MD at [facility]     Family History   Problem Relation Age of Onset   ??? Diabetes Mother    ??? Diabetes Father    ??? Cancer-Lung Father    ??? Melanoma Neg Hx      Social History     Social History   ??? Marital status: Divorced     Spouse name: N/A   ??? Number of children: 3   ??? Years of education: N/A     Occupational History   ??? Not on file.     Social History Main Topics   ??? Smoking status: Former Smoker     Quit date: 04/12/1971   ??? Smokeless tobacco: Never Used   ??? Alcohol use No   ??? Drug use: No   ??? Sexual activity: Not on file     Other Topics Concern   ??? Not on file     Social History Narrative   ??? No narrative on file     Objective:         ??? amLODIPine (NORVASC) 10 mg tablet Take 1 tablet by mouth daily. (Patient taking differently: Take 5 mg by mouth daily.)   ???  aspirin 81 mg chewable tablet Take 81 mg by mouth daily.     ??? atorvastatin (LIPITOR) 40 mg tablet Take 40 mg by mouth daily.   ??? capsaicin 0.025 % topical cream Apply on the affected area for neuropathic pain.  Wash hands after application   ??? cholecalciferol (VITAMIN D-3) 1,000 units tablet Take 1,000 Units by mouth daily.   ??? duloxetine DR (CYMBALTA) 30 mg capsule Take 1 capsule by mouth twice daily.   ??? gabapentin (NEURONTIN) 800 mg tablet Take 1 tablet by mouth every 8 hours.   ??? glucose 4 gram chewable tablet Chew 4 tablets by mouth as Needed.   ??? hydrOXYzine (ATARAX) 25 mg tablet Take 25 mg by mouth three times daily as needed for Itching.   ??? ibuprofen (ADVIL) 200 mg tablet Take 200 mg by mouth every 6 hours as needed for Pain. Take with food.   ??? insulin aspart (NOVOLOG FLEXPEN) 100 unit/mL injection PEN Inject 18 Units under the skin three times daily with meals.   ??? insulin glargine (LANTUS) 100 unit/mL injection Inject 60 Units under the skin at bedtime daily. 1 vial contains 10mL ??? insulin pen needles (disposable) (PEN NEEDLE) 31 gauge x 5/16 pen needle Use 1 each as directed as Needed. Use with insulin pen. Can substitute for any approved insulin needle   ??? lisinopril (PRINIVIL, ZESTRIL) 40 mg tablet Take 40 mg by mouth daily.   ??? nortriptyline (PAMELOR) 25 mg capsule Take 3 capsules by mouth at bedtime daily. 1 cap qhs x5d, then 2 caps qhs x5d, then 3 caps qhs thereafter   ??? omeprazole DR(+) (PRILOSEC) 20 mg capsule Take 1 capsule by mouth daily before breakfast.   ??? semaglutide (OZEMPIC) 1 mg/0.75 mL (2 mg/1.5 mL) injection PEN Inject one mg under the skin every 7 days.   ??? tamsulosin (FLOMAX) 0.4 mg capsule TAKE ONE CAPSULE BY MOUTH ONCE DAILY.TAKE 30 MINUTES AFTER SAME MEAL. DO NOT CUT, CRUSH, CHEW.     Vitals:    05/06/17 1005   BP: 162/85   Pulse: 115   Weight: 65.2 kg (143 lb 12.8 oz)   Height: 167.6 cm (66)     Body mass index is 23.21 kg/m???.     Physical Exam    Vitals reviewed.  Constitutional:  oriented to person, place, and time. appears well-developed and well-nourished. No distress.   HENT:   Head: Normocephalic and atraumatic.   Mouth/Throat: Oropharynx is clear and moist.   Eyes: Conjunctivae normal   Neck: Normal range of motion. Neck supple.   Cardiovascular: Normal rate  Pulmonary/Chest: Effort normal   Musculoskeletal: exhibits no edema.   Neurological: alert and oriented to person, place, and time. Neuro exam is grossly unremarkable   Skin is warm and dry. No rash noted. No erythema.   Psychiatric: has a normal mood and affect. behavior is normal. Judgment and thought content normal.           Labs reviewed.     Assessment and Plan:    Diabetes mellitus type 2,  Un- controlled   ??? Hemoglobin A1c in the clinic today was 12.3% that has worsened from 11.9% in June 2018.  ??? He is currently on Lantus 34 units twice daily, NovoLog 16 units with meals however, he only takes NovoLog with his dinner.    Plan : ??? We discussed importance of taking his NovoLog with all of his meals.  He will try to carry his NovoLog pen with him when he  is working.  ??? His fasting glucose in the clinic today was 145 mg/dL.  Will continue same dose Lantus for now.  ??? Will try adding GLP-1 agonist.  I have sent a prescription for Ozempic and gave him a sample in the clinic to start taking it.  He denies any history of pancreatitis.  He does not have any family history of thyroid cancer.   ??? Discussed risks and benefits of the medication.  ??? If he can afford Ozempic, he will continue to take NovoLog with meals but he will decrease the dose to 10 and increase by 2 units at a time depending  on his glucose readings.          Diabetic complication assessment:   ??? Eye exam : one year ago. Due for an eye exam   ??? Urine microalbumin/Cr was low in 2013, ordered repeat today   ??? Annual labs (electrolytes and renal function): ordered repeat today   ???  Thyroid function testing : Normal March 2017, ordered repeat today.    Neuropathy  ??? Controlled on Cymbalta and gabapentin    Hyperlipidemia   ??? Had lipid profile in March 2017, that was consistent with hypertriglyceridemia and low HDL which will improve with better diabetes control.   ??? Continue current Lipitor dose 40 mg daily    Hypertension   ??? Blood pressure was high today, he has not taken his antihypertensive medications today   ??? He is on ACEI.     Return to clinic in 3 months      Orders Placed This Encounter   ??? COMPREHENSIVE METABOLIC PANEL   ??? MICROALB/CR RATIO-URINE RANDOM   ??? TSH WITH FREE T4 REFLEX   ??? POC GLUCOSE QUANTITATIVE BLOOD   ??? POC HEMOGLOBIN A1C   ??? semaglutide (OZEMPIC) 1 mg/0.75 mL (2 mg/1.5 mL) injection PEN

## 2017-05-07 ENCOUNTER — Ambulatory Visit: Admit: 2017-05-06 | Discharge: 2017-05-07 | Payer: MEDICARE

## 2017-05-07 DIAGNOSIS — R1084 Generalized abdominal pain: Principal | ICD-10-CM

## 2017-05-07 DIAGNOSIS — I10 Essential (primary) hypertension: ICD-10-CM

## 2017-05-07 DIAGNOSIS — Z794 Long term (current) use of insulin: ICD-10-CM

## 2017-05-07 DIAGNOSIS — Z87891 Personal history of nicotine dependence: ICD-10-CM

## 2017-05-07 DIAGNOSIS — E785 Hyperlipidemia, unspecified: ICD-10-CM

## 2017-05-07 DIAGNOSIS — E1142 Type 2 diabetes mellitus with diabetic polyneuropathy: ICD-10-CM

## 2017-05-07 DIAGNOSIS — E119 Type 2 diabetes mellitus without complications: ICD-10-CM

## 2017-05-08 ENCOUNTER — Encounter: Admit: 2017-05-08 | Discharge: 2017-05-08 | Payer: MEDICARE

## 2017-05-08 ENCOUNTER — Encounter: Admit: 2017-05-08 | Discharge: 2017-05-08 | Payer: Medicare Other

## 2017-06-14 ENCOUNTER — Ambulatory Visit: Admit: 2017-06-14 | Discharge: 2017-06-15 | Payer: MEDICARE

## 2017-06-14 ENCOUNTER — Encounter: Admit: 2017-06-14 | Discharge: 2017-06-14 | Payer: Medicare Other

## 2017-06-14 ENCOUNTER — Encounter: Admit: 2017-06-14 | Discharge: 2017-06-14 | Payer: MEDICARE

## 2017-06-14 DIAGNOSIS — E119 Type 2 diabetes mellitus without complications: Principal | ICD-10-CM

## 2017-06-14 DIAGNOSIS — W19XXXA Unspecified fall, initial encounter: ICD-10-CM

## 2017-06-14 DIAGNOSIS — R1011 Right upper quadrant pain: ICD-10-CM

## 2017-06-14 DIAGNOSIS — M1990 Unspecified osteoarthritis, unspecified site: ICD-10-CM

## 2017-06-14 DIAGNOSIS — I10 Essential (primary) hypertension: ICD-10-CM

## 2017-06-14 DIAGNOSIS — E785 Hyperlipidemia, unspecified: ICD-10-CM

## 2017-06-14 DIAGNOSIS — H269 Unspecified cataract: ICD-10-CM

## 2017-06-15 DIAGNOSIS — R1012 Left upper quadrant pain: Principal | ICD-10-CM

## 2017-08-13 ENCOUNTER — Encounter: Admit: 2017-08-13 | Discharge: 2017-08-13 | Payer: MEDICARE

## 2017-08-14 MED ORDER — OMEPRAZOLE 20 MG PO CPDR
ORAL_CAPSULE | Freq: Two times a day (BID) | 0 refills
Start: 2017-08-14 — End: ?

## 2017-09-12 ENCOUNTER — Encounter: Admit: 2017-09-12 | Discharge: 2017-09-12 | Payer: Medicare Other

## 2017-09-12 DIAGNOSIS — M1990 Unspecified osteoarthritis, unspecified site: ICD-10-CM

## 2017-09-12 DIAGNOSIS — I10 Essential (primary) hypertension: ICD-10-CM

## 2017-09-12 DIAGNOSIS — W19XXXA Unspecified fall, initial encounter: ICD-10-CM

## 2017-09-12 DIAGNOSIS — E785 Hyperlipidemia, unspecified: ICD-10-CM

## 2017-09-12 DIAGNOSIS — E119 Type 2 diabetes mellitus without complications: Principal | ICD-10-CM

## 2017-09-12 DIAGNOSIS — H269 Unspecified cataract: ICD-10-CM

## 2017-09-25 ENCOUNTER — Encounter: Admit: 2017-09-25 | Discharge: 2017-09-25 | Payer: MEDICARE

## 2017-09-25 DIAGNOSIS — R1012 Left upper quadrant pain: Principal | ICD-10-CM

## 2017-09-25 DIAGNOSIS — R1011 Right upper quadrant pain: ICD-10-CM

## 2017-10-28 ENCOUNTER — Encounter: Admit: 2017-10-28 | Discharge: 2017-10-28 | Payer: Medicare Other

## 2017-11-15 ENCOUNTER — Ambulatory Visit: Admit: 2017-11-15 | Discharge: 2017-11-16 | Payer: MEDICARE

## 2017-11-15 ENCOUNTER — Ambulatory Visit: Admit: 2017-11-15 | Discharge: 2017-11-15 | Payer: Medicare Other

## 2017-11-15 ENCOUNTER — Encounter: Admit: 2017-11-15 | Discharge: 2017-11-15 | Payer: MEDICARE

## 2017-11-15 DIAGNOSIS — E785 Hyperlipidemia, unspecified: ICD-10-CM

## 2017-11-15 DIAGNOSIS — M1990 Unspecified osteoarthritis, unspecified site: ICD-10-CM

## 2017-11-15 DIAGNOSIS — H269 Unspecified cataract: ICD-10-CM

## 2017-11-15 DIAGNOSIS — E119 Type 2 diabetes mellitus without complications: Principal | ICD-10-CM

## 2017-11-15 DIAGNOSIS — W19XXXA Unspecified fall, initial encounter: ICD-10-CM

## 2017-11-15 DIAGNOSIS — I10 Essential (primary) hypertension: ICD-10-CM

## 2017-11-16 DIAGNOSIS — M792 Neuralgia and neuritis, unspecified: ICD-10-CM

## 2017-11-16 DIAGNOSIS — R1011 Right upper quadrant pain: Secondary | ICD-10-CM

## 2017-11-16 DIAGNOSIS — R1012 Left upper quadrant pain: Principal | ICD-10-CM

## 2017-11-19 ENCOUNTER — Encounter: Admit: 2017-11-19 | Discharge: 2017-11-19 | Payer: MEDICARE

## 2017-11-19 DIAGNOSIS — H269 Unspecified cataract: ICD-10-CM

## 2017-11-19 DIAGNOSIS — W19XXXA Unspecified fall, initial encounter: ICD-10-CM

## 2017-11-19 DIAGNOSIS — I10 Essential (primary) hypertension: ICD-10-CM

## 2017-11-19 DIAGNOSIS — E785 Hyperlipidemia, unspecified: ICD-10-CM

## 2017-11-19 DIAGNOSIS — M1990 Unspecified osteoarthritis, unspecified site: ICD-10-CM

## 2017-11-19 DIAGNOSIS — E119 Type 2 diabetes mellitus without complications: Principal | ICD-10-CM

## 2017-11-24 ENCOUNTER — Encounter: Admit: 2017-11-24 | Discharge: 2017-11-24 | Payer: MEDICARE

## 2017-11-24 ENCOUNTER — Emergency Department: Admit: 2017-11-24 | Discharge: 2017-11-24 | Payer: Medicare Other

## 2017-11-24 DIAGNOSIS — E785 Hyperlipidemia, unspecified: ICD-10-CM

## 2017-11-24 DIAGNOSIS — W19XXXA Unspecified fall, initial encounter: ICD-10-CM

## 2017-11-24 DIAGNOSIS — M1990 Unspecified osteoarthritis, unspecified site: ICD-10-CM

## 2017-11-24 DIAGNOSIS — I10 Essential (primary) hypertension: ICD-10-CM

## 2017-11-24 DIAGNOSIS — E119 Type 2 diabetes mellitus without complications: Principal | ICD-10-CM

## 2017-11-24 DIAGNOSIS — H269 Unspecified cataract: ICD-10-CM

## 2017-11-24 DIAGNOSIS — N23 Unspecified renal colic: ICD-10-CM

## 2017-11-24 MED ORDER — LACTATED RINGERS IV SOLP
1000 mL | INTRAVENOUS | 0 refills | Status: CP
Start: 2017-11-24 — End: ?
  Administered 2017-11-25: 02:00:00 1000 mL via INTRAVENOUS

## 2017-11-24 MED ORDER — INSULIN REGULAR HUMAN(#) 1 UNIT/ML IJ SYRINGE
6 [IU] | Freq: Once | INTRAVENOUS | 0 refills | Status: CP
Start: 2017-11-24 — End: ?
  Administered 2017-11-25: 02:00:00 6 [IU] via INTRAVENOUS

## 2017-11-24 MED ORDER — LACTATED RINGERS IV SOLP
1000 mL | INTRAVENOUS | 0 refills | Status: CP
Start: 2017-11-24 — End: ?
  Administered 2017-11-25: 01:00:00 1000 mL via INTRAVENOUS

## 2017-11-25 ENCOUNTER — Emergency Department: Admit: 2017-11-25 | Discharge: 2017-11-25 | Disposition: A | Discharge status: Home / Self Care | Payer: MEDICARE

## 2017-11-25 DIAGNOSIS — E1165 Type 2 diabetes mellitus with hyperglycemia: Principal | ICD-10-CM

## 2017-11-25 DIAGNOSIS — Z794 Long term (current) use of insulin: ICD-10-CM

## 2017-11-25 DIAGNOSIS — R350 Frequency of micturition: ICD-10-CM

## 2017-11-25 LAB — COMPREHENSIVE METABOLIC PANEL
Lab: 0.4 mg/dL (ref 0.3–1.2)
Lab: 11 K/UL (ref 3–12)
Lab: 18 U/L (ref 7–40)
Lab: 24 MMOL/L (ref 21–30)
Lab: 25 mg/dL (ref 7–25)
Lab: 26 U/L (ref 7–56)
Lab: 3.9 MMOL/L (ref 3.5–5.1)
Lab: 597 mg/dL — ABNORMAL HIGH (ref 70–100)
Lab: 6.6 g/dL (ref 6.0–8.0)
Lab: 63 U/L — ABNORMAL LOW (ref 25–110)
Lab: >60 mL/min (ref >60)
Lab: >60 mL/min (ref >60)

## 2017-11-25 LAB — URINALYSIS DIPSTICK
Lab: NEGATIVE
Lab: NEGATIVE
Lab: NEGATIVE
Lab: NEGATIVE
Lab: NEGATIVE
Lab: NEGATIVE

## 2017-11-25 LAB — BLOOD GASES, PERIPHERAL VENOUS
Lab: 24 MMOL/L (ref 8.5–10.6)
Lab: 83 % — ABNORMAL HIGH (ref 55–71)

## 2017-11-25 LAB — POC GLUCOSE
Lab: 490 mg/dL — ABNORMAL HIGH (ref 70–100)
Lab: 331 mg/dL — ABNORMAL HIGH (ref 70–100)
Lab: 265 mg/dL — ABNORMAL HIGH (ref 70–100)

## 2017-11-25 LAB — URINALYSIS, MICROSCOPIC

## 2017-11-25 LAB — CBC AND DIFF
Lab: 0 10*3/uL (ref 0–0.20)
Lab: 0.1 10*3/uL (ref 0–0.45)
Lab: 4.8 M/UL — ABNORMAL LOW (ref 4.4–5.5)
Lab: 70 % (ref 41–77)
Lab: 9.4 10*3/uL (ref 4.5–11.0)

## 2017-11-25 LAB — BETA HYDROXYBUTYRATE (KETONES): Lab: 0.4 MMOL/L — ABNORMAL HIGH (ref <0.3)

## 2017-11-25 LAB — POC TROPONIN: Lab: 0 ng/mL (ref 0.00–0.05)

## 2017-12-04 ENCOUNTER — Encounter: Admit: 2017-12-04 | Discharge: 2017-12-04 | Payer: Medicare Other

## 2017-12-04 ENCOUNTER — Ambulatory Visit: Admit: 2017-12-04 | Discharge: 2017-12-04 | Payer: Medicare Other

## 2017-12-04 ENCOUNTER — Ambulatory Visit: Admit: 2017-12-04 | Discharge: 2017-12-04 | Payer: MEDICARE

## 2017-12-04 DIAGNOSIS — E785 Hyperlipidemia, unspecified: ICD-10-CM

## 2017-12-04 DIAGNOSIS — E11649 Type 2 diabetes mellitus with hypoglycemia without coma: ICD-10-CM

## 2017-12-04 DIAGNOSIS — I10 Essential (primary) hypertension: ICD-10-CM

## 2017-12-04 DIAGNOSIS — M1990 Unspecified osteoarthritis, unspecified site: ICD-10-CM

## 2017-12-04 DIAGNOSIS — W19XXXA Unspecified fall, initial encounter: ICD-10-CM

## 2017-12-04 DIAGNOSIS — R350 Frequency of micturition: Principal | ICD-10-CM

## 2017-12-04 DIAGNOSIS — H269 Unspecified cataract: ICD-10-CM

## 2017-12-04 DIAGNOSIS — E119 Type 2 diabetes mellitus without complications: ICD-10-CM

## 2017-12-04 LAB — HEMOGLOBIN A1C: Lab: 13 % — ABNORMAL HIGH (ref 4.0–6.0)

## 2017-12-04 MED ORDER — TROSPIUM 60 MG PO CP24
60 mg | ORAL_CAPSULE | Freq: Every day | ORAL | 3 refills | 30.00000 days | Status: AC
Start: 2017-12-04 — End: 2017-12-13

## 2017-12-13 MED ORDER — MYRBETRIQ 50 MG PO TB24
50 mg | ORAL_TABLET | Freq: Every day | ORAL | 1 refills | Status: AC
Start: 2017-12-13 — End: 2018-05-28

## 2017-12-18 ENCOUNTER — Encounter: Admit: 2017-12-18 | Discharge: 2017-12-18 | Payer: MEDICARE

## 2018-01-10 ENCOUNTER — Encounter: Admit: 2018-01-10 | Discharge: 2018-01-10 | Payer: Medicare Other

## 2018-01-10 ENCOUNTER — Ambulatory Visit: Admit: 2018-01-10 | Discharge: 2018-01-11 | Payer: MEDICARE

## 2018-01-10 DIAGNOSIS — E0800 Diabetes mellitus due to underlying condition with hyperosmolarity without nonketotic hyperglycemic-hyperosmolar coma (NKHHC): Secondary | ICD-10-CM

## 2018-01-10 DIAGNOSIS — E119 Type 2 diabetes mellitus without complications: Principal | ICD-10-CM

## 2018-01-10 DIAGNOSIS — M1990 Unspecified osteoarthritis, unspecified site: ICD-10-CM

## 2018-01-10 DIAGNOSIS — I10 Essential (primary) hypertension: ICD-10-CM

## 2018-01-10 DIAGNOSIS — W19XXXA Unspecified fall, initial encounter: ICD-10-CM

## 2018-01-10 DIAGNOSIS — H269 Unspecified cataract: ICD-10-CM

## 2018-01-10 DIAGNOSIS — E785 Hyperlipidemia, unspecified: ICD-10-CM

## 2018-01-10 MED ORDER — INSULIN GLARGINE 100 UNIT/ML SC SOLN
40 [IU] | Freq: Two times a day (BID) | SUBCUTANEOUS | 3 refills | 68.00000 days | Status: AC
Start: 2018-01-10 — End: 2018-01-16

## 2018-01-10 MED ORDER — METFORMIN 500 MG PO TB24
2000 mg | ORAL_TABLET | Freq: Every day | ORAL | 3 refills | Status: AC
Start: 2018-01-10 — End: 2019-06-01

## 2018-01-10 MED ORDER — FREESTYLE LIBRE 14 DAY SENSOR MISC KIT
PACK | ORAL | 3 refills | 30.00000 days | Status: AC
Start: 2018-01-10 — End: 2018-05-01

## 2018-01-10 MED ORDER — FREESTYLE LIBRE 14 DAY READER MISC MISC
ORAL | 1 refills | Status: AC
Start: 2018-01-10 — End: 2018-05-01

## 2018-01-10 MED ORDER — INSULIN ASPART 100 UNIT/ML SC FLEXPEN
20 [IU] | Freq: Three times a day (TID) | SUBCUTANEOUS | 3 refills | 42.00000 days | Status: AC
Start: 2018-01-10 — End: 2018-06-02

## 2018-01-10 MED ORDER — PEN NEEDLE, DIABETIC 32 GAUGE X 5/32" MISC NDLE
1 | Freq: Every day | 3 refills | 30.00000 days | Status: AC
Start: 2018-01-10 — End: ?

## 2018-01-10 MED ORDER — SEMAGLUTIDE 0.25 MG OR 0.5 MG(2 MG/1.5 ML) SC PNIJ
.25 mg | SUBCUTANEOUS | 0 refills | Status: AC
Start: 2018-01-10 — End: 2018-05-01

## 2018-01-11 DIAGNOSIS — E1142 Type 2 diabetes mellitus with diabetic polyneuropathy: Principal | ICD-10-CM

## 2018-01-11 DIAGNOSIS — Z794 Long term (current) use of insulin: ICD-10-CM

## 2018-01-16 ENCOUNTER — Encounter: Admit: 2018-01-16 | Discharge: 2018-01-16 | Payer: MEDICARE

## 2018-01-16 MED ORDER — LANTUS SOLOSTAR U-100 INSULIN 100 UNIT/ML (3 ML) SC INPN
Freq: Two times a day (BID) | 1 refills | 60.00000 days | Status: AC
Start: 2018-01-16 — End: 2018-05-01

## 2018-02-28 ENCOUNTER — Encounter: Admit: 2018-02-28 | Discharge: 2018-02-28 | Payer: MEDICARE

## 2018-02-28 ENCOUNTER — Encounter: Admit: 2018-02-28 | Discharge: 2018-02-28 | Payer: Medicare Other

## 2018-02-28 DIAGNOSIS — R1011 Right upper quadrant pain: ICD-10-CM

## 2018-02-28 DIAGNOSIS — R1012 Left upper quadrant pain: Principal | ICD-10-CM

## 2018-03-17 ENCOUNTER — Encounter: Admit: 2018-03-17 | Discharge: 2018-03-17 | Payer: MEDICARE

## 2018-03-17 ENCOUNTER — Ambulatory Visit: Admit: 2018-03-17 | Discharge: 2018-03-17 | Payer: Medicare Other

## 2018-03-17 ENCOUNTER — Ambulatory Visit: Admit: 2018-03-17 | Discharge: 2018-03-18 | Payer: MEDICARE

## 2018-03-17 DIAGNOSIS — E785 Hyperlipidemia, unspecified: ICD-10-CM

## 2018-03-17 DIAGNOSIS — I10 Essential (primary) hypertension: ICD-10-CM

## 2018-03-17 DIAGNOSIS — H269 Unspecified cataract: ICD-10-CM

## 2018-03-17 DIAGNOSIS — E119 Type 2 diabetes mellitus without complications: Principal | ICD-10-CM

## 2018-03-17 DIAGNOSIS — M1990 Unspecified osteoarthritis, unspecified site: ICD-10-CM

## 2018-03-17 DIAGNOSIS — W19XXXA Unspecified fall, initial encounter: ICD-10-CM

## 2018-03-18 DIAGNOSIS — R1011 Right upper quadrant pain: ICD-10-CM

## 2018-03-18 DIAGNOSIS — M792 Neuralgia and neuritis, unspecified: ICD-10-CM

## 2018-03-18 DIAGNOSIS — R1012 Left upper quadrant pain: Principal | ICD-10-CM

## 2018-05-01 ENCOUNTER — Encounter: Admit: 2018-05-01 | Discharge: 2018-05-01 | Payer: Medicare Other

## 2018-05-01 ENCOUNTER — Ambulatory Visit: Admit: 2018-05-01 | Discharge: 2018-05-02 | Payer: MEDICARE

## 2018-05-01 ENCOUNTER — Encounter: Admit: 2018-05-01 | Discharge: 2018-05-01 | Payer: MEDICARE

## 2018-05-01 DIAGNOSIS — E1142 Type 2 diabetes mellitus with diabetic polyneuropathy: Principal | ICD-10-CM

## 2018-05-01 DIAGNOSIS — E785 Hyperlipidemia, unspecified: ICD-10-CM

## 2018-05-01 DIAGNOSIS — W19XXXA Unspecified fall, initial encounter: ICD-10-CM

## 2018-05-01 DIAGNOSIS — H269 Unspecified cataract: ICD-10-CM

## 2018-05-01 DIAGNOSIS — Z23 Encounter for immunization: Secondary | ICD-10-CM

## 2018-05-01 DIAGNOSIS — M1990 Unspecified osteoarthritis, unspecified site: ICD-10-CM

## 2018-05-01 DIAGNOSIS — E119 Type 2 diabetes mellitus without complications: Principal | ICD-10-CM

## 2018-05-01 DIAGNOSIS — I10 Essential (primary) hypertension: ICD-10-CM

## 2018-05-01 MED ORDER — LANTUS SOLOSTAR U-100 INSULIN 100 UNIT/ML (3 ML) SC INPN
Freq: Two times a day (BID) | 3 refills | 68.00000 days | Status: AC
Start: 2018-05-01 — End: ?

## 2018-05-01 MED ORDER — SEMAGLUTIDE 0.25 MG OR 0.5 MG(2 MG/1.5 ML) SC PNIJ
.5 mg | SUBCUTANEOUS | 3 refills | Status: AC
Start: 2018-05-01 — End: 2019-06-01

## 2018-05-01 MED ORDER — SEMAGLUTIDE 0.25 MG OR 0.5 MG(2 MG/1.5 ML) SC PNIJ
.5 mg | SUBCUTANEOUS | 3 refills | Status: AC
Start: 2018-05-01 — End: 2018-05-01

## 2018-05-01 MED ORDER — DEXCOM G6 SENSOR MISC DEVI
1 | 3 refills | Status: AC
Start: 2018-05-01 — End: 2018-05-28

## 2018-05-01 MED ORDER — DEXCOM G6 TRANSMITTER MISC DEVI
1 | 3 refills | Status: AC
Start: 2018-05-01 — End: 2018-05-28

## 2018-05-01 MED ORDER — DEXCOM G6 RECEIVER MISC MISC
1 | Freq: Once | 0 refills | Status: AC
Start: 2018-05-01 — End: ?

## 2018-05-28 ENCOUNTER — Ambulatory Visit: Admit: 2018-05-28 | Discharge: 2018-05-28 | Payer: MEDICARE

## 2018-05-28 ENCOUNTER — Ambulatory Visit: Admit: 2018-05-28 | Discharge: 2018-05-28 | Payer: Medicare Other

## 2018-05-28 ENCOUNTER — Encounter: Admit: 2018-05-28 | Discharge: 2018-05-28 | Payer: Medicare Other

## 2018-05-28 DIAGNOSIS — N401 Enlarged prostate with lower urinary tract symptoms: ICD-10-CM

## 2018-05-28 DIAGNOSIS — E0800 Diabetes mellitus due to underlying condition with hyperosmolarity without nonketotic hyperglycemic-hyperosmolar coma (NKHHC): ICD-10-CM

## 2018-05-28 DIAGNOSIS — Z794 Long term (current) use of insulin: ICD-10-CM

## 2018-05-28 DIAGNOSIS — Z125 Encounter for screening for malignant neoplasm of prostate: Principal | ICD-10-CM

## 2018-05-28 DIAGNOSIS — E785 Hyperlipidemia, unspecified: ICD-10-CM

## 2018-05-28 DIAGNOSIS — W19XXXA Unspecified fall, initial encounter: ICD-10-CM

## 2018-05-28 DIAGNOSIS — H269 Unspecified cataract: ICD-10-CM

## 2018-05-28 DIAGNOSIS — E119 Type 2 diabetes mellitus without complications: Principal | ICD-10-CM

## 2018-05-28 DIAGNOSIS — I10 Essential (primary) hypertension: ICD-10-CM

## 2018-05-28 DIAGNOSIS — M1990 Unspecified osteoarthritis, unspecified site: ICD-10-CM

## 2018-05-28 LAB — PSA SCREEN: Lab: 0.1 ng/mL (ref <4.01)

## 2018-05-28 LAB — URINALYSIS, MICROSCOPIC

## 2018-05-28 LAB — URINALYSIS DIPSTICK
Lab: NEGATIVE
Lab: NEGATIVE
Lab: NEGATIVE
Lab: NEGATIVE
Lab: NEGATIVE

## 2018-05-28 MED ORDER — MYRBETRIQ 50 MG PO TB24
50 mg | ORAL_TABLET | Freq: Every day | ORAL | 3 refills | Status: AC
Start: 2018-05-28 — End: 2018-10-21

## 2018-05-28 MED ORDER — TAMSULOSIN 0.4 MG PO CAP
.4 mg | ORAL_CAPSULE | Freq: Every day | ORAL | 3 refills | 90.00000 days | Status: AC
Start: 2018-05-28 — End: 2018-12-31

## 2018-06-02 ENCOUNTER — Encounter: Admit: 2018-06-02 | Discharge: 2018-06-02 | Payer: Medicare Other

## 2018-06-02 MED ORDER — INSULIN LISPRO 100 UNIT/ML SC INPN
20 [IU] | Freq: Three times a day (TID) | SUBCUTANEOUS | 3 refills | 30.00000 days | Status: AC
Start: 2018-06-02 — End: 2019-06-30

## 2018-06-05 ENCOUNTER — Encounter: Admit: 2018-06-05 | Discharge: 2018-06-05 | Payer: MEDICARE

## 2018-06-13 ENCOUNTER — Encounter: Admit: 2018-06-13 | Discharge: 2018-06-13

## 2018-07-16 MED ORDER — BUPIVACAINE (PF) 0.5 % (5 MG/ML) IJ SOLN
8 mL | Freq: Once | INTRAMUSCULAR | 0 refills | Status: CP
Start: 2018-07-16 — End: ?
  Administered 2017-11-15: 17:00:00 8 mL via INTRAMUSCULAR

## 2018-07-16 MED ORDER — TRIAMCINOLONE ACETONIDE 40 MG/ML IJ SUSP
80 mg | Freq: Once | 0 refills | Status: CP
Start: 2018-07-16 — End: ?
  Administered 2017-11-15: 17:00:00 80 mg

## 2018-07-16 MED ORDER — LIDOCAINE (PF) 20 MG/ML (2 %) IJ SOLN
8 mL | Freq: Once | INTRAMUSCULAR | 0 refills | Status: CP
Start: 2018-07-16 — End: ?
  Administered 2017-11-15: 17:00:00 8 mL via INTRAMUSCULAR

## 2018-09-05 ENCOUNTER — Encounter: Admit: 2018-09-05 | Discharge: 2018-09-05 | Payer: Medicare Other

## 2018-09-12 ENCOUNTER — Encounter: Admit: 2018-09-12 | Discharge: 2018-09-12 | Payer: Medicare Other

## 2018-09-22 ENCOUNTER — Encounter: Admit: 2018-09-22 | Discharge: 2018-09-22 | Payer: MEDICARE

## 2018-09-22 ENCOUNTER — Ambulatory Visit: Admit: 2018-09-22 | Discharge: 2018-09-23 | Payer: MEDICARE

## 2018-09-22 ENCOUNTER — Encounter: Admit: 2018-09-22 | Discharge: 2018-09-22 | Payer: Medicare Other

## 2018-09-22 DIAGNOSIS — W19XXXA Unspecified fall, initial encounter: ICD-10-CM

## 2018-09-22 DIAGNOSIS — E1142 Type 2 diabetes mellitus with diabetic polyneuropathy: Principal | ICD-10-CM

## 2018-09-22 DIAGNOSIS — E119 Type 2 diabetes mellitus without complications: Principal | ICD-10-CM

## 2018-09-22 DIAGNOSIS — I10 Essential (primary) hypertension: ICD-10-CM

## 2018-09-22 DIAGNOSIS — H269 Unspecified cataract: ICD-10-CM

## 2018-09-22 DIAGNOSIS — M1990 Unspecified osteoarthritis, unspecified site: ICD-10-CM

## 2018-09-22 DIAGNOSIS — E785 Hyperlipidemia, unspecified: ICD-10-CM

## 2018-10-10 ENCOUNTER — Encounter: Admit: 2018-10-10 | Discharge: 2018-10-10 | Payer: MEDICARE

## 2018-10-21 MED ORDER — MYRBETRIQ 50 MG PO TB24
ORAL_TABLET | Freq: Every day | 0 refills | Status: DC
Start: 2018-10-21 — End: 2019-05-25

## 2018-11-27 ENCOUNTER — Encounter: Admit: 2018-11-27 | Discharge: 2018-11-27

## 2018-11-27 DIAGNOSIS — E1142 Type 2 diabetes mellitus with diabetic polyneuropathy: Secondary | ICD-10-CM

## 2018-12-26 ENCOUNTER — Encounter: Admit: 2018-12-26 | Discharge: 2018-12-26

## 2018-12-26 ENCOUNTER — Encounter: Admit: 2018-12-26 | Discharge: 2018-12-27

## 2018-12-31 ENCOUNTER — Encounter: Admit: 2018-12-31 | Discharge: 2018-12-31

## 2018-12-31 ENCOUNTER — Ambulatory Visit: Admit: 2018-12-31 | Discharge: 2019-01-01

## 2018-12-31 DIAGNOSIS — G629 Polyneuropathy, unspecified: Secondary | ICD-10-CM

## 2018-12-31 DIAGNOSIS — I10 Essential (primary) hypertension: Secondary | ICD-10-CM

## 2018-12-31 DIAGNOSIS — M1990 Unspecified osteoarthritis, unspecified site: Secondary | ICD-10-CM

## 2018-12-31 DIAGNOSIS — E785 Hyperlipidemia, unspecified: Secondary | ICD-10-CM

## 2018-12-31 DIAGNOSIS — E1142 Type 2 diabetes mellitus with diabetic polyneuropathy: Secondary | ICD-10-CM

## 2018-12-31 DIAGNOSIS — W19XXXA Unspecified fall, initial encounter: Secondary | ICD-10-CM

## 2018-12-31 DIAGNOSIS — H4902 Third [oculomotor] nerve palsy, left eye: Principal | ICD-10-CM

## 2018-12-31 DIAGNOSIS — E119 Type 2 diabetes mellitus without complications: Secondary | ICD-10-CM

## 2018-12-31 DIAGNOSIS — H269 Unspecified cataract: Secondary | ICD-10-CM

## 2018-12-31 MED ORDER — DULOXETINE 30 MG PO CPDR
30 mg | ORAL_CAPSULE | Freq: Two times a day (BID) | ORAL | 3 refills | 60.00000 days | Status: AC
Start: 2018-12-31 — End: ?

## 2018-12-31 MED ORDER — GABAPENTIN 300 MG PO CAP
300 mg | ORAL_CAPSULE | Freq: Three times a day (TID) | ORAL | 5 refills | Status: AC
Start: 2018-12-31 — End: ?

## 2019-01-01 DIAGNOSIS — I729 Aneurysm of unspecified site: Secondary | ICD-10-CM

## 2019-01-01 NOTE — Progress Notes
This is a 68 year old male with uncontrolled diabetes (HbA1c was 12 in the past). He was evaluated in the past for neuropathic pain and diabetic amyotrophy. His family member tells me that he complained of pain behind the left eye and left face on December 26, 2018. He was seen in the ER of [facility] where MRI brain, MRA head, and MRA neck was done and reported normal. There is eyelid droop on the left side and he has been evaluated by an ophthalmologist (optometrist?) recently. The pain in his legs is burning in nature and present all the time. Based on the review of medication list, he is only taking gabapentin 300 mg in a day and duloxetine.     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 Although this is classically pupil sparing but his involves the pupils. There are some papers where slight asymmetry has been suggested.     I will go ahead and request for a CTA head to rule out aneurysm. MRA head and MRA neck was normal at [facility].     A referral has been placed for neuro ophthalmology.     Gabapentin 300 mg three times a day.     Capsaicin cream     Continue duloxetine.       The patient was provided with our clinic numbers if there is any question or concern. A follow-up visit would be arranged in 6 months.     Berdine Dance, MD  Department of Neurology  [facility]      I spent 25 minutes with this patient. More than 50% of the time was spent in counseling and coordination of care.

## 2019-01-19 ENCOUNTER — Ambulatory Visit: Admit: 2019-01-19 | Discharge: 2019-01-19

## 2019-01-19 ENCOUNTER — Encounter: Admit: 2019-01-19 | Discharge: 2019-01-19

## 2019-01-19 DIAGNOSIS — I729 Aneurysm of unspecified site: Secondary | ICD-10-CM

## 2019-01-19 DIAGNOSIS — H4902 Third [oculomotor] nerve palsy, left eye: Secondary | ICD-10-CM

## 2019-01-19 LAB — POC CREATININE, RAD: Lab: 0.8 mg/dL (ref 0.4–1.24)

## 2019-01-19 MED ORDER — SODIUM CHLORIDE 0.9 % IJ SOLN
50 mL | Freq: Once | INTRAVENOUS | 0 refills | Status: CP
Start: 2019-01-19 — End: ?
  Administered 2019-01-19: 23:00:00 50 mL via INTRAVENOUS

## 2019-01-19 MED ORDER — IOHEXOL 350 MG IODINE/ML IV SOLN
65 mL | Freq: Once | INTRAVENOUS | 0 refills | Status: CP
Start: 2019-01-19 — End: ?
  Administered 2019-01-19: 23:00:00 65 mL via INTRAVENOUS

## 2019-01-30 ENCOUNTER — Encounter: Admit: 2019-01-30 | Discharge: 2019-01-30

## 2019-01-30 ENCOUNTER — Ambulatory Visit: Admit: 2019-01-30 | Discharge: 2019-01-31

## 2019-01-30 DIAGNOSIS — I10 Essential (primary) hypertension: Secondary | ICD-10-CM

## 2019-01-30 DIAGNOSIS — E119 Type 2 diabetes mellitus without complications: Secondary | ICD-10-CM

## 2019-01-30 DIAGNOSIS — M1990 Unspecified osteoarthritis, unspecified site: Secondary | ICD-10-CM

## 2019-01-30 DIAGNOSIS — H2513 Age-related nuclear cataract, bilateral: Secondary | ICD-10-CM

## 2019-01-30 DIAGNOSIS — H4902 Third [oculomotor] nerve palsy, left eye: Principal | ICD-10-CM

## 2019-01-30 DIAGNOSIS — E0800 Diabetes mellitus due to underlying condition with hyperosmolarity without nonketotic hyperglycemic-hyperosmolar coma (NKHHC): Secondary | ICD-10-CM

## 2019-01-30 DIAGNOSIS — H269 Unspecified cataract: Secondary | ICD-10-CM

## 2019-01-30 DIAGNOSIS — E785 Hyperlipidemia, unspecified: Secondary | ICD-10-CM

## 2019-01-30 DIAGNOSIS — W19XXXA Unspecified fall, initial encounter: Secondary | ICD-10-CM

## 2019-01-30 DIAGNOSIS — Z794 Long term (current) use of insulin: Secondary | ICD-10-CM

## 2019-01-30 NOTE — Assessment & Plan Note
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

## 2019-01-30 NOTE — Assessment & Plan Note
Does the patient have Diabetes? 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

## 2019-01-30 NOTE — Assessment & Plan Note
Does the patient have Diabetes? 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     Poorly controlled. Last A1C was 12    BS were running in 400s per patient and now closer to 160. Emphasized importance of BP/DM control. Explained risk of more severe stroke or even death if DM not well-controlled

## 2019-01-30 NOTE — Assessment & Plan Note
The patient's cataracts are not yet causing sufficient limitation in activities of daily living to justify cataract surgery.  Observe for now.

## 2019-01-31 DIAGNOSIS — E113299 Type 2 diabetes mellitus with mild nonproliferative diabetic retinopathy without macular edema, unspecified eye: Secondary | ICD-10-CM

## 2019-02-13 ENCOUNTER — Encounter: Admit: 2019-02-13 | Discharge: 2019-02-13

## 2019-02-22 ENCOUNTER — Encounter: Admit: 2019-02-22 | Discharge: 2019-02-22

## 2019-02-22 ENCOUNTER — Emergency Department: Admit: 2019-02-22 | Discharge: 2019-02-22

## 2019-02-22 DIAGNOSIS — E119 Type 2 diabetes mellitus without complications: Secondary | ICD-10-CM

## 2019-02-22 DIAGNOSIS — E785 Hyperlipidemia, unspecified: Secondary | ICD-10-CM

## 2019-02-22 DIAGNOSIS — W19XXXA Unspecified fall, initial encounter: Secondary | ICD-10-CM

## 2019-02-22 DIAGNOSIS — M1990 Unspecified osteoarthritis, unspecified site: Secondary | ICD-10-CM

## 2019-02-22 DIAGNOSIS — R079 Chest pain, unspecified: Principal | ICD-10-CM

## 2019-02-22 DIAGNOSIS — I10 Essential (primary) hypertension: Secondary | ICD-10-CM

## 2019-02-22 DIAGNOSIS — H269 Unspecified cataract: Secondary | ICD-10-CM

## 2019-02-22 LAB — POC TROPONIN: Lab: 0 ng/mL (ref 0.00–0.05)

## 2019-02-22 MED ORDER — ALUMINUM-MAGNESIUM HYDROXIDE 200-200 MG/5 ML PO SUSP
30 mL | ORAL | 0 refills | Status: DC | PRN
Start: 2019-02-22 — End: 2019-02-24

## 2019-02-22 MED ORDER — NITROGLYCERIN 0.4 MG SL SUBL
.4 mg | SUBLINGUAL | 0 refills | Status: DC | PRN
Start: 2019-02-22 — End: 2019-02-24
  Administered 2019-02-23: 02:00:00 0.4 mg via SUBLINGUAL

## 2019-02-22 MED ORDER — ASPIRIN 81 MG PO CHEW
324 mg | Freq: Once | ORAL | 0 refills | Status: CP
Start: 2019-02-22 — End: ?
  Administered 2019-02-23: 02:00:00 324 mg via ORAL

## 2019-02-22 MED ORDER — KETOROLAC 15 MG/ML IJ SOLN
15 mg | Freq: Once | INTRAVENOUS | 0 refills | Status: CP
Start: 2019-02-22 — End: ?
  Administered 2019-02-23: 01:00:00 15 mg via INTRAVENOUS

## 2019-02-22 MED ORDER — ACETAMINOPHEN 325 MG PO TAB
650 mg | ORAL | 0 refills | Status: DC | PRN
Start: 2019-02-22 — End: 2019-02-24
  Administered 2019-02-23: 07:00:00 650 mg via ORAL

## 2019-02-22 MED ORDER — ONDANSETRON HCL (PF) 4 MG/2 ML IJ SOLN
4 mg | INTRAVENOUS | 0 refills | Status: DC | PRN
Start: 2019-02-22 — End: 2019-02-24

## 2019-02-22 MED ORDER — AMLODIPINE 5 MG PO TAB
10 mg | Freq: Every day | ORAL | 0 refills | Status: DC
Start: 2019-02-22 — End: 2019-02-24
  Administered 2019-02-23: 14:00:00 10 mg via ORAL

## 2019-02-22 MED ORDER — GABAPENTIN 300 MG PO CAP
300 mg | Freq: Three times a day (TID) | ORAL | 0 refills | Status: DC
Start: 2019-02-22 — End: 2019-02-24
  Administered 2019-02-23 (×2): 300 mg via ORAL

## 2019-02-22 MED ORDER — ASPIRIN 81 MG PO CHEW
81 mg | Freq: Every day | ORAL | 0 refills | Status: DC
Start: 2019-02-22 — End: 2019-02-24
  Administered 2019-02-23: 14:00:00 81 mg via ORAL

## 2019-02-22 MED ORDER — PANTOPRAZOLE 40 MG IV SOLR
40 mg | Freq: Once | INTRAVENOUS | 0 refills | Status: CP
Start: 2019-02-22 — End: ?
  Administered 2019-02-23: 02:00:00 40 mg via INTRAVENOUS

## 2019-02-22 MED ORDER — LISINOPRIL 20 MG PO TAB
40 mg | Freq: Every day | ORAL | 0 refills | Status: DC
Start: 2019-02-22 — End: 2019-02-24
  Administered 2019-02-23: 14:00:00 40 mg via ORAL

## 2019-02-22 MED ORDER — OXYCODONE-ACETAMINOPHEN 5-325 MG PO TAB
1-2 | ORAL | 0 refills | Status: DC | PRN
Start: 2019-02-22 — End: 2019-02-24
  Administered 2019-02-23: 07:00:00 1 via ORAL

## 2019-02-22 MED ORDER — PATCH DOCUMENTATION - LIDOCAINE 5%
Freq: Two times a day (BID) | TRANSDERMAL | 0 refills | Status: DC
Start: 2019-02-22 — End: 2019-02-23

## 2019-02-22 MED ORDER — LIDOCAINE 5 % TP PTMD
1 | Freq: Once | TOPICAL | 0 refills | Status: DC
Start: 2019-02-22 — End: 2019-02-23
  Administered 2019-02-23: 01:00:00 1 via TOPICAL

## 2019-02-22 MED ORDER — IOHEXOL 350 MG IODINE/ML IV SOLN
75 mL | Freq: Once | INTRAVENOUS | 0 refills | Status: CP
Start: 2019-02-22 — End: ?
  Administered 2019-02-23: 04:00:00 75 mL via INTRAVENOUS

## 2019-02-22 MED ORDER — HYDRALAZINE 20 MG/ML IJ SOLN
10 mg | INTRAVENOUS | 0 refills | Status: DC | PRN
Start: 2019-02-22 — End: 2019-02-24

## 2019-02-22 MED ORDER — ATORVASTATIN 40 MG PO TAB
40 mg | Freq: Every day | ORAL | 0 refills | Status: DC
Start: 2019-02-22 — End: 2019-02-24
  Administered 2019-02-23: 14:00:00 40 mg via ORAL

## 2019-02-22 NOTE — ED Notes
Pt presents to [facility] with complaints of chest pain x 3 months. Pt feels that this pain has gradually increased, denies any SOB, abdominal pain, fevers, cough, N/V. Pt rates the pain 8/10 and describes it as a burning/itchine sensation that starts in his right chest and radiates to his right shoulder and back. Pt denies any recent trauma. BP equal on both sides, pulses +3 in BUE. Monitors applied and VSS on RA. Bed in the lowest locked position and call light within reach.

## 2019-02-23 ENCOUNTER — Emergency Department: Admit: 2019-02-23 | Discharge: 2019-02-23 | Payer: MEDICARE

## 2019-02-23 ENCOUNTER — Encounter: Admit: 2019-02-23 | Discharge: 2019-02-23 | Payer: MEDICARE

## 2019-02-23 LAB — POC TROPONIN: Lab: 0 ng/mL (ref 0.00–0.05)

## 2019-02-23 LAB — CBC AND DIFF
Lab: 0.1 10*3/uL (ref 0–0.20)
Lab: 0.1 10*3/uL (ref 0–0.45)
Lab: 13 % (ref 11–15)
Lab: 2 % (ref 0–2)
Lab: 2.8 10*3/uL (ref 1.0–4.8)
Lab: 220 K/UL (ref 150–400)
Lab: 31 % (ref >60)
Lab: 31 pg (ref 26–34)
Lab: 4.9 M/UL (ref 4.4–5.5)
Lab: 43 % (ref 40–50)
Lab: 6 % (ref >60)
Lab: 8.6 FL (ref 7–11)
Lab: 9.1 10*3/uL (ref 4.5–11.0)
Lab: 17 (ref <20.7)

## 2019-02-23 LAB — TROPONIN-I: Lab: 0 ng/mL (ref 0.0–0.05)

## 2019-02-23 LAB — BNP (B-TYPE NATRIURETIC PEPTI): Lab: 14 pg/mL (ref 0–100)

## 2019-02-23 LAB — LIPASE: Lab: 25 U/L (ref 11–82)

## 2019-02-23 LAB — COMPREHENSIVE METABOLIC PANEL
Lab: 136 MMOL/L — ABNORMAL LOW (ref 137–147)
Lab: 195 mg/dL — ABNORMAL HIGH (ref 70–100)
Lab: 4.2 MMOL/L (ref 3.5–5.1)

## 2019-02-23 LAB — PTT (APTT): Lab: 28 s (ref 24.0–36.5)

## 2019-02-23 LAB — POC GLUCOSE
Lab: 182 mg/dL — ABNORMAL HIGH (ref 70–100)
Lab: 126 mg/dL — ABNORMAL HIGH (ref 70–100)
Lab: 129 mg/dL — ABNORMAL HIGH (ref 70–100)

## 2019-02-23 LAB — TSH WITH FREE T4 REFLEX: Lab: 1.5 uU/mL (ref 0.35–5.00)

## 2019-02-23 LAB — PROTIME INR (PT): Lab: 1.1 FL (ref 0.8–1.2)

## 2019-02-23 LAB — COVID-19 (SARS-COV-2) PCR

## 2019-02-23 LAB — D-DIMER: Lab: 254 ng{FEU}/mL (ref <500)

## 2019-02-23 MED ORDER — SODIUM CHLORIDE 0.9 % IV SOLP
250 mL | INTRAVENOUS | 0 refills | Status: DC | PRN
Start: 2019-02-23 — End: 2019-02-24

## 2019-02-23 MED ORDER — AMINOPHYLLINE 500 MG/20 ML IV SOLN
50 mg | INTRAVENOUS | 0 refills | Status: DC | PRN
Start: 2019-02-23 — End: 2019-02-24

## 2019-02-23 MED ORDER — EUCALYPTUS-MENTHOL MM LOZG
1 | Freq: Once | ORAL | 0 refills | Status: DC | PRN
Start: 2019-02-23 — End: 2019-02-24

## 2019-02-23 MED ORDER — NITROGLYCERIN 0.4 MG SL SUBL
.4 mg | SUBLINGUAL | 0 refills | Status: DC | PRN
Start: 2019-02-23 — End: 2019-02-24

## 2019-02-23 MED ORDER — PATCH DOCUMENTATION - LIDOCAINE 5%
Freq: Two times a day (BID) | TRANSDERMAL | 0 refills | Status: DC
Start: 2019-02-23 — End: 2019-02-23

## 2019-02-23 MED ORDER — LIDOCAINE 5 % TP PTMD
1 | Freq: Once | TOPICAL | 0 refills | Status: DC
Start: 2019-02-23 — End: 2019-02-23

## 2019-02-23 MED ORDER — PERFLUTREN LIPID MICROSPHERES 1.1 MG/ML IV SUSP
1-20 mL | Freq: Once | INTRAVENOUS | 0 refills | Status: DC | PRN
Start: 2019-02-23 — End: 2019-02-24

## 2019-02-23 MED ORDER — OXYCODONE-ACETAMINOPHEN 5-325 MG PO TAB
1 | ORAL_TABLET | Freq: Every evening | ORAL | 0 refills | 2.00000 days | Status: AC | PRN
Start: 2019-02-23 — End: ?

## 2019-02-23 MED ORDER — REGADENOSON 0.4 MG/5 ML IV SYRG
.4 mg | Freq: Once | INTRAVENOUS | 0 refills | Status: CP
Start: 2019-02-23 — End: ?
  Administered 2019-02-23: 15:00:00 0.4 mg via INTRAVENOUS

## 2019-02-23 MED ORDER — ALBUTEROL SULFATE 90 MCG/ACTUATION IN HFAA
2 | RESPIRATORY_TRACT | 0 refills | Status: DC | PRN
Start: 2019-02-23 — End: 2019-02-24

## 2019-02-23 NOTE — ED Notes
ADMIT TO CLINICAL THROUGHPUT UNIT - OBSERVATION    Kevin Obrien is a 68 y.o. male who presented to the Emergency Department.  The Emergency Department note including history, review of systems, physical exam, diagnostic testing and results was reviewed and confirmed.      History:    History, provided by patient and medical record, indicates onset was 1 month ago, but now much worse -  ago, and is constant.  Onset occurred during rest, sleep, exertion, deep breaths and movements.    Patient describes pain as pain that radiates to the back.  Associated symptoms include none - cant sleep with the pain.  These symptoms are recurrent daily.  Patient states pain is worsened by movements and rolling to the right side make it worse, pushing on it makes it worse - .  Patient states pain is relieved by nothing.  Currently, the severity of pain is 4/10, with pain having reached a maximum of 7/10.    The patient has a medical history of diabetes, hypertension and hyperlipidemia.  The patient's family has a history of CAD and MI.   The patient's social history includes:   Smoking: non-smoker  Alcohol: Social  Drug Use: Never    The patient reports previous cardiac testing of none.    TIMI Score:     -Risk Factors - need 3 to get 1 point:   -DM, Dyslipidemia, HTN and FH of premature CAD     -1 point for each of the following:   -Age > or = 65 and ASA in last 7 days     -Total TIMI Score: 3  Using Heart Score criteria as below...   2 points 1 point 0 points   History Highly suspicious Moderately suspicious Slightly suspicious   EKG Significant ST-depression Non-specific repolarization disturbance   normal   Age ?65 45-65 <45   Risk Factors* ?3 or history of atherosclerotic disease 1-2 No risk factors known   Troponin ?3 times normal limit 1-3 times normal limit ?normal limit   * risk factors = HLD, HTN, DM, tobacco use, positive family history, obesity  .Marland KitchenMarland Kitchenpt with Heart Score of 5, which predicts risk of major adverse cardiac event as    low (0-3 points), with 0.9-1.7% risk over the next 6 weeks.  moderate (4-7 points), with 12.0-16.6% risk over the next 6 weeks.  high (8-10 points), with 5--65% risk over the next 6 weeks.    Exam:    BP: 141/68 (09/13 2100)  Temp: 36.4 ?C (97.5 ?F) (09/13 1620)  Pulse: 90 (09/13 2100)  Respirations: 17 PER MINUTE (09/13 2100)  SpO2: 98 % (09/13 2100)  SpO2 Pulse: 90 (09/13 2100)  BP: (118-150)/(56-76)   Temp:  [36.4 ?C (97.5 ?F)]   Pulse:  [83-93]   Respirations:  [15 PER MINUTE-22 PER MINUTE]   SpO2:  [98 %-100 %]       BP (!) 141/68  - Pulse 90  - Temp 36.4 ?C (97.5 ?F)  - SpO2 98%   General appearance: alert, well-developed, cooperative and mild distress  Neck: supple, symmetrical, trachea midline, no adenopathy, thyroid: not enlarged, symmetric, no tenderness/mass/nodules, no carotid bruit and no JVD  Back: symmetric, no curvature. ROM normal. No CVA tenderness., no rash seen  Lungs: clear to auscultation bilaterally  Chest wall: right sided chest wall tenderness, roughly T4 dermatome and around to mid scapulat area -   Heart: normal apical impulse, regular rate and rhythm, systolic murmur: systolic ejection 2/6, decrescendo  at lower left sternal border  Extremities: extremities normal, atraumatic, no cyanosis or edema  Pulses: 2+ and symmetric  Neurologic: Grossly normal  Alert and oriented X 3, normal strength and tone. Normal symmetric reflexes. Normal coordination and gait    ECG: normal sinus rhythm, no blocks or conduction defects, no ischemic changes, WNL and sl tachy 105    Labs:    24-hour labs:    Results for orders placed or performed during the hospital encounter of 02/22/19 (from the past 24 hour(s))   POC TROPONIN    Collection Time: 02/22/19  6:27 PM   Result Value Ref Range    Troponin-I-POC 0.01 0.00 - 0.05 NG/ML   COMPREHENSIVE METABOLIC PANEL    Collection Time: 02/22/19  6:28 PM   Result Value Ref Range    Sodium 136 (L) 137 - 147 MMOL/L    Potassium 4.2 3.5 - 5.1 MMOL/L Chloride 99 98 - 110 MMOL/L    Glucose 195 (H) 70 - 100 MG/DL    Blood Urea Nitrogen 19 7 - 25 MG/DL    Creatinine 0.27 0.4 - 1.24 MG/DL    Calcium 25.3 8.5 - 66.4 MG/DL    Total Protein 7.8 6.0 - 8.0 G/DL    Total Bilirubin 0.6 0.3 - 1.2 MG/DL    Albumin 4.8 3.5 - 5.0 G/DL    Alk Phosphatase 47 25 - 110 U/L    AST (SGOT) 16 7 - 40 U/L    CO2 28 21 - 30 MMOL/L    ALT (SGPT) 23 7 - 56 U/L    Anion Gap 9 3 - 12    eGFR Non African American >60 >60 mL/min    eGFR African American >60 >60 mL/min   D-DIMER    Collection Time: 02/22/19  6:28 PM   Result Value Ref Range    D-Dimer 254 <500 ng/mL FEU   CBC AND DIFF    Collection Time: 02/22/19  6:28 PM   Result Value Ref Range    White Blood Cells 9.1 4.5 - 11.0 K/UL    RBC 4.94 4.4 - 5.5 M/UL    Hemoglobin 15.3 13.5 - 16.5 GM/DL    Hematocrit 40.3 40 - 50 %    MCV 88.3 80 - 100 FL    MCH 31.0 26 - 34 PG    MCHC 35.1 32.0 - 36.0 G/DL    RDW 47.4 11 - 15 %    Platelet Count 220 150 - 400 K/UL    MPV 8.6 7 - 11 FL    Neutrophils 59 41 - 77 %    Lymphocytes 31 24 - 44 %    Monocytes 6 4 - 12 %    Eosinophils 2 0 - 5 %    Basophils 2 0 - 2 %    Absolute Neutrophil Count 5.45 1.8 - 7.0 K/UL    Absolute Lymph Count 2.80 1.0 - 4.8 K/UL    Absolute Monocyte Count 0.53 0 - 0.80 K/UL    Absolute Eosinophil Count 0.16 0 - 0.45 K/UL    Absolute Basophil Count 0.16 0 - 0.20 K/UL    MDW (Monocyte Distribution Width) 17.2 <20.7   LIPASE    Collection Time: 02/22/19  6:28 PM   Result Value Ref Range    Lipase 25 11 - 82 U/L   PROTIME INR (PT)    Collection Time: 02/22/19  6:28 PM   Result Value Ref Range    INR 1.1 0.8 - 1.2  PTT (APTT)    Collection Time: 02/22/19  6:28 PM   Result Value Ref Range    APTT 28.8 24.0 - 36.5 SEC   TSH WITH FREE T4 REFLEX    Collection Time: 02/22/19  6:28 PM   Result Value Ref Range    TSH 1.55 0.35 - 5.00 MCU/ML   BNP (B-TYPE NATRIURETIC PEPTI)    Collection Time: 02/22/19  6:28 PM   Result Value Ref Range B Type Natriuretic Peptide 14.0 0 - 100 PG/ML   POC TROPONIN    Collection Time: 02/22/19  9:06 PM   Result Value Ref Range    Troponin-I-POC 0.00 0.00 - 0.05 NG/ML       Diagnosis:    1. Chest Pain: will get serial troponins. Plan for Reg thalluim stress test. Continue to observe closely for any changes. Will give ASA if not given or allergic.Will get echo for concern about aortic valve with murmur - bicuspid/AS  Will get CTA chest as well - concern about previous nodule versus PE with mild tachy -   Broken ribs and will want to see ribs, and T4 region on spine for possible neuropathic reason for the pain - Been going on longer than would be expected wihtout seeing zoster rash -   Unclear at this point -       Resident Supervision Note concerning Rhea Pink: I personally performed the E/M including history, physical exam, and MDM.    Gildardo Griffes, MD

## 2019-03-06 ENCOUNTER — Ambulatory Visit: Admit: 2019-03-06 | Discharge: 2019-03-07 | Payer: MEDICARE

## 2019-03-06 ENCOUNTER — Encounter: Admit: 2019-03-06 | Discharge: 2019-03-06 | Payer: MEDICARE

## 2019-03-06 NOTE — Progress Notes
[facility] Interventional [facility]  Follow Up Visit  Subjective     Chief Complaint: Multifocal pain    HPI: Kevin Obrien is a 68 y.o. male who  has a past medical history of Accidental fall, Arthritis, Cataract, DM (diabetes mellitus) (HCC), Hyperlipidemia, and Hypertension. who now presents for evaluation of pain.     He has previously been seen for pain in the LUQ of the abdomen.  He had excellent relief with complete resolution and disappearance of his abdominal pain after a couple TAP blocks.     He was last seen about a year ago.     He presents today with complaint of shingles pain.   It is on the right thorax.   There is a rash from the back wrapping around anteriorly into the right breast.   He states it is around the nipple level.     The pain is sharp, burning, tingly.   There is itchiness.     It started about a month ago.     The pain seems to have plateaued.     He denies being placed on acyclovir or any antiviral for this.  No recent steroids.     The pain is rated about 8/10.     The pain is exacerbated with light touch.   No pain with respiration.     No clear alleviating factor.            PRIOR MEDICATIONS:   Effective  Gabapentin  Cymbalta    Ineffective  Nortriptyline  Capsaicin oint  NSAID  Acetaminophen    Unable to tolerate      Never  Lyrica  Tizanidine      PRIOR INTERVENTIONS:  No spine surgery   Effective  Bilateral TAP block     Ineffective  Exercise, pain limited        Kevin Obrien denies any recent fevers, chills, infection, antibiotics, bowel or bladder incontinence, saddle anesthesia, bleeding issues, or recent anticoagulant.     ROS: All 14 systems reviewed and found to be negative except as above and as follows.     Past Medical History:  Medical History:   Diagnosis Date   ? Accidental fall    ? Arthritis    ? Cataract    ? DM (diabetes mellitus) (HCC)    ? Hyperlipidemia    ? Hypertension        Family History:  Family History Problem Relation Age of Onset   ? Diabetes Mother    ? Diabetes Father    ? Cancer-Lung Father    ? Melanoma Neg Hx    ? Cataract Neg Hx    ? Glaucoma Neg Hx    ? Macular Degen Neg Hx        Social History:  Lives in [place], [place].   Social History     Socioeconomic History   ? Marital status: Separated     Spouse name: Not on file   ? Number of children: 3   ? Years of education: Not on file   ? Highest education level: Not on file   Occupational History   ? Not on file   Tobacco Use   ? Smoking status: Former Smoker     Last attempt to quit: 04/12/1971     Years since quitting: 47.9   ? Smokeless tobacco: Never Used   Substance and Sexual Activity   ? Alcohol use: Not Currently   ?  Drug use: Never   ? Sexual activity: Not on file   Other Topics Concern   ? Not on file   Social History Narrative   ? Not on file       Allergies:  No Known Allergies    Medications:    Current Outpatient Medications:   ?  amLODIPine (NORVASC) 10 mg tablet, Take 1 tablet by mouth Obrien. (Patient taking differently: Take 5 mg by mouth Obrien.), Disp: 90 tablet, Rfl: 3  ?  aspirin 81 mg chewable tablet, Take 81 mg by mouth Obrien.  , Disp: , Rfl:   ?  atorvastatin (LIPITOR) 40 mg tablet, Take 40 mg by mouth Obrien., Disp: , Rfl:   ?  capsaicin 0.025 % topical cream, Apply on the affected area for neuropathic pain. Wash hands after application, Disp: 21 g, Rfl: 3  ?  cephalexin (KEFLEX) 500 mg capsule, Take 500 mg by mouth four times Obrien., Disp: , Rfl:   ?  duloxetine DR (CYMBALTA) 30 mg capsule, Take one capsule by mouth twice Obrien. Indications: neuropathic pain, Disp: 60 capsule, Rfl: 3  ?  gabapentin (NEURONTIN) 300 mg capsule, Take one capsule by mouth three times Obrien., Disp: 90 capsule, Rfl: 5  ?  glucose 4 gram chewable tablet, Chew 4 tablets by mouth as Needed., Disp: 50 tablet, Rfl: 3  ?  hydrOXYzine (ATARAX) 25 mg tablet, Take 25 mg by mouth three times Obrien as needed for Itching., Disp: , Rfl: ?  ibuprofen (ADVIL) 200 mg tablet, Take 200 mg by mouth every 6 hours as needed for Pain. Take with food., Disp: , Rfl:   ?  insulin lispro (HUMALOG KWIKPEN) 100 unit/mL injection PEN, Inject twenty Units under the skin three times Obrien with meals., Disp: 60 mL, Rfl: 3  ?  insulin pen needles (disposable) (B-D NANO; ULTICARE MICRO) 32 gauge x 5/32 pen needle, Use one each as directed five times Obrien. Use with insulin injections., Disp: 500 each, Rfl: 3  ?  LANTUS SOLOSTAR U-100 INSULIN 100 unit/mL (3 mL) injection PEN, Inject forty Units under the skin twice Obrien., Disp: 75 mL, Rfl: 3  ?  lisinopril (PRINIVIL, ZESTRIL) 40 mg tablet, Take 40 mg by mouth Obrien., Disp: , Rfl:   ?  metFORMIN-XR(+) (GLUCOPHAGE XR) 500 mg extended release tablet, Take four tablets by mouth Obrien with dinner., Disp: 360 tablet, Rfl: 3  ?  MYRBETRIQ 50 mg tablet, Take 1 tablet by mouth once Obrien, Disp: 90 tablet, Rfl: 0  ?  oxyCODONE/acetaminophen (PERCOCET) 5/325 mg tablet, Take one tablet by mouth at bedtime as needed for Pain, Disp: 15 tablet, Rfl: 0  ?  semaglutide (OZEMPIC) 0.25 mg or 0.5 mg(2 mg/1.5 mL) injection PEN, Inject one-half mg under the skin every 7 days., Disp: 5 mL, Rfl: 3    Physical examination:   BP 108/54  - Pulse 90  - Temp 37 ?C (98.6 ?F)  - Ht 167.6 cm (66)  - Wt 62.1 kg (137 lb)  - SpO2 99%  - BMI 22.11 kg/m?   Pain Score: Eight    General: The patient is a well-developed, well nourished 68 y.o. male in no acute distress.   HEENT: Head is normocephalic and atraumatic. Pupils are equal and reactive to light bilaterally.   Cardiac: Based on palpation, pulse appears to be regular rate and rhythm. There is TTP along the 5th-7th ribs with allodynia most pronounced along the T6 dermatome.   Pulmonary: The patient has unlabored respirations and  bilateral symmetric chest excursion.   Abdomen: Soft, nontender, and nondistended. There is no rebound or guarding.   Extremities: No clubbing, cyanosis, or edema. Neurologic:   The patient is alert and oriented times 3.   Cranial nerves II through XII are intact without any focal deficits.     Musculoskeletal:   Gait is normal.    L-Spine   There is no paraspinal tenderness. Paraspinal muscle tone is normal.  There is no tenderness or radiating pain with palpation over the SI joints, piriformis, or greater trochanteric bursae bilaterally.  ROM with flexion, extension, rotation, and lateral bending is intact.  Strength is equal and adequate bilaterally in the flexors and extensors of the bilateral lower extremities.   SLR is negative bilaterally.      MRI L-Spine Results:    Results for orders placed during the hospital encounter of 11/24/16   MRI L-SPINE WO CONTRAST    Narrative MRI L-SPINE WO CONTRAST    Clinical indication: Lumbosacral radiculopathy.    Technique: MRI of the lumbar spine is performed without contrast.    Contrast: None    Comparison: CTA of the abdomen and pelvis from March 01, 2016    Findings:    There is straightening of lumbar lordosis. The vertebral body heights are well-maintained without a suspicious geographic marrow lesion. There is disc degeneration and discogenic endplate changes from L3 through S1. The conus terminates normally. The nerve roots of the cauda equina are normal in appearance. The prevertebral and paraspinal soft tissues are unremarkable.    L1-L2: Normal.    L2-L3: No spinal canal or neural foraminal narrowing.    L3-L4: Symmetrically bulging disc and prominent posterior epidural fat in mild spinal canal and bilateral neural foraminal narrowing.    L4-L5: Symmetrically bulging disc with prominent posterior epidural fat, facet arthropathy, and ligamentum flavum laxity results in moderate spinal canal and right neural foraminal narrowing. There is mild left neural foraminal compromise.    L5-S1: Symmetrically bulging disc and facet arthropathy results in mild right subarticular zone and moderate bilateral neural foraminal narrowing.      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       Finalized by Wandra Mannan, M.D. on 11/26/2016 8:02 AM. Dictated by Wandra Mannan, M.D. on 11/26/2016 7:57 AM.         Last Cr and LFT's:  Creatinine   Date Value Ref Range Status   02/22/2019 0.94 0.4 - 1.24 MG/DL Final     AST (SGOT)   Date Value Ref Range Status   02/22/2019 16 7 - 40 U/L Final     ALT (SGPT)   Date Value Ref Range Status   02/22/2019 23 7 - 56 U/L Final     Alk Phosphatase   Date Value Ref Range Status   02/22/2019 47 25 - 110 U/L Final     Total Bilirubin   Date Value Ref Range Status   02/22/2019 0.6 0.3 - 1.2 MG/DL Final          Assessment:    Kevin Obrien is a 68 y.o. male who  has a past medical history of Accidental fall, Arthritis, Cataract, DM (diabetes mellitus) (HCC), Hyperlipidemia, and Hypertension. who presents for evaluation of multifocal pain.     The pain complaints are most  likely due to:    1. Intercostal neuralgia     2. PHN (postherpetic neuralgia)     3. Neuropathic pain         Patient has had an adequate trial of > 3 months of rest, exercise, NSAID's, multimodal treatment, and the passage of time without improvement of symptoms. The pain has significant impact on the Obrien quality of life.     Plan:    1. Will increase gabapentin to 300/300/600 and could consider 600/300/600 next.   2. If he fails to improve with gabapentin, we can consider Lyrica next.   3. Plan for Right 5th-7th intercostal nerve blocks with U/S in Clinic.   4. Will trial lidocaine ointment. He does not feel capsaicin has helped.   5. Follow up as needed.     Risks/benefits of all pharmacologic and interventional treatments discussed and questions answered.

## 2019-03-19 MED ORDER — DEXAMETHASONE SODIUM PHOS (PF) 10 MG/ML IJ SOLN
15 mg | Freq: Once | 0 refills | Status: CP
Start: 2019-03-19 — End: ?

## 2019-03-19 MED ORDER — IOHEXOL 300 MG IODINE/ML IV SOLN
2 mL | Freq: Once | 0 refills | Status: CP
Start: 2019-03-19 — End: ?

## 2019-03-19 MED ORDER — LIDOCAINE (PF) 10 MG/ML (1 %) IJ SOLN
2 mL | Freq: Once | INTRAMUSCULAR | 0 refills | Status: CP
Start: 2019-03-19 — End: ?

## 2019-03-19 NOTE — Procedures
Attending Surgeon: Evelina Bucy, MD    Anesthesia: Local    Pre-Procedure Diagnosis:   1. Intercostal neuralgia    2. PHN (postherpetic neuralgia)    3. Neuropathic pain        Post-Procedure Diagnosis:   1. Intercostal neuralgia    2. PHN (postherpetic neuralgia)    3. Neuropathic pain        Nerve Block (Procedure)  Nerve: Intercostal,  # of Intercostal Levels: 3  Laterality: right      Consent:   Consent obtained: written  Consent given by: patient  Alternatives discussed: no treatment, delayed treatment and alternative treatment  Discussed with patient the purpose of the treatment/procedure, other ways of treating my condition, including no treatment/ procedure and the risks and benefits of the alternatives. Patient has decided to proceed with treatment/procedure.        Universal Protocol:  Relevant documents: relevant documents present and verified  Site marked: the operative site was marked  Patient identity confirmed: Patient identify confirmed verbally with patient.        Time out: Immediately prior to procedure a time out was called to verify the correct patient, procedure, equipment, support staff and site/side marked as required        Procedures Details:   Indications: Neuritis and Pain Relief  Preparation: Patient was prepped and draped in the usual sterile fashion.  Prep: 2% chlorhexidine  Patient position: prone  Needle size: 25 G  Guidance: fluoroscopy  Outcome: Pain relieved  Patient tolerance: tolerated well, no immediate complications  Comments:   INTERCOSTAL NERVE BLOCK    PROCEDURE:    1) right 5th-7th intercostal nerve block  2) Fluoroscopic needle guidance    REASON FOR PROCEDURE: Intercostal neuralgia    PHYSICIAN: Evelina Bucy, MD    MEDICATIONS INJECTED: 15mg  dexamethasone and 1% lidocaine. Divided as 2 ml at each level.    LOCAL ANESTHETIC INJECTED: Lidocaine 1%    SEDATION MEDICATIONS: None    ESTIMATED BLOOD LOSS: None    SPECIMENS REMOVED: None    COMPLICATIONS: None TECHNIQUE: Time-out was taken to identify the correct patient, procedure and side prior to starting the procedure. Lying in a prone position, the patient was prepped and draped in the usual sterile fashion using DuraPrep and a fenestrated drape. The target intercostal nerve was fluoroscopically visualized, and then a 25-gauge, 2-inch needle was used to proceed down to the inferior aspect of the rib. The needle was then walked off inferiorly until it dipped down to just below the rib. After a negative aspiration, Omnipaque 240 contrast was used to confirm spread along the undersurface of the above named rib. An equal portion of the above injectate was then given at this level, and any additional level that was given above.    The procedure was completed without complications and was  tolerated well. The patient was monitored after the procedure. The patient (or responsible party) was given post-procedure and discharge instructions to follow at home. The patient was discharged in stable condition.           Estimated blood loss: none or minimal  Specimens: none  Patient tolerated the procedure well with no immediate complications. Pressure was applied, and hemostasis was accomplished.

## 2019-03-19 NOTE — Progress Notes
I have examined the patient, and there are no significant changes in their condition, from the previous H&P performed on 03/06/19.     Right 5th - 7th intercostal nerve blocks        [facility]  Follow Up Visit  Subjective     Chief Complaint: Multifocal pain    HPI: Kevin Obrien is a 68 y.o. male who  has a past medical history of Accidental fall, Arthritis, Cataract, DM (diabetes mellitus) (HCC), Hyperlipidemia, and Hypertension. who now presents for evaluation of pain.     He has previously been seen for pain in the LUQ of the abdomen.  He had excellent relief with complete resolution and disappearance of his abdominal pain after a couple TAP blocks.     He was last seen about a year ago.     He presents today with complaint of shingles pain.   It is on the right thorax.   There is a rash from the back wrapping around anteriorly into the right breast.   He states it is around the nipple level.     The pain is sharp, burning, tingly.   There is itchiness.     It started about a month ago.     The pain seems to have plateaued.     He denies being placed on acyclovir or any antiviral for this.  No recent steroids.     The pain is rated about 8/10.     The pain is exacerbated with light touch.   No pain with respiration.     No clear alleviating factor.            PRIOR MEDICATIONS:   Effective  Gabapentin  Cymbalta    Ineffective  Nortriptyline  Capsaicin oint  NSAID  Acetaminophen    Unable to tolerate      Never  Lyrica  Tizanidine      PRIOR INTERVENTIONS:  No spine surgery   Effective  Bilateral TAP block     Ineffective  Exercise, pain limited        Delmore A Kitchell denies any recent fevers, chills, infection, antibiotics, bowel or bladder incontinence, saddle anesthesia, bleeding issues, or recent anticoagulant.     ROS: All 14 systems reviewed and found to be negative except as above and as follows.     Past Medical History:  Medical History:   Diagnosis Date ? Accidental fall    ? Arthritis    ? Cataract    ? DM (diabetes mellitus) (HCC)    ? Hyperlipidemia    ? Hypertension        Family History:  Family History   Problem Relation Age of Onset   ? Diabetes Mother    ? Diabetes Father    ? Cancer-Lung Father    ? Melanoma Neg Hx    ? Cataract Neg Hx    ? Glaucoma Neg Hx    ? Macular Degen Neg Hx        Social History:  Lives in [place], [place].   Social History     Socioeconomic History   ? Marital status: Separated     Spouse name: Not on file   ? Number of children: 3   ? Years of education: Not on file   ? Highest education level: Not on file   Occupational History   ? Not on file   Tobacco Use   ? Smoking status: Former Smoker  Last attempt to quit: 04/12/1971     Years since quitting: 47.9   ? Smokeless tobacco: Never Used   Substance and Sexual Activity   ? Alcohol use: Not Currently   ? Drug use: Never   ? Sexual activity: Not on file   Other Topics Concern   ? Not on file   Social History Narrative   ? Not on file       Allergies:  No Known Allergies    Medications:    Current Outpatient Medications:   ?  amLODIPine (NORVASC) 10 mg tablet, Take 1 tablet by mouth daily. (Patient taking differently: Take 5 mg by mouth daily.), Disp: 90 tablet, Rfl: 3  ?  aspirin 81 mg chewable tablet, Take 81 mg by mouth daily.  , Disp: , Rfl:   ?  atorvastatin (LIPITOR) 40 mg tablet, Take 40 mg by mouth daily., Disp: , Rfl:   ?  capsaicin 0.025 % topical cream, Apply on the affected area for neuropathic pain. Wash hands after application, Disp: 21 g, Rfl: 3  ?  cephalexin (KEFLEX) 500 mg capsule, Take 500 mg by mouth four times daily., Disp: , Rfl:   ?  duloxetine DR (CYMBALTA) 30 mg capsule, Take one capsule by mouth twice daily. Indications: neuropathic pain, Disp: 60 capsule, Rfl: 3  ?  gabapentin (NEURONTIN) 300 mg capsule, Take one capsule by mouth three times daily., Disp: 90 capsule, Rfl: 5  ?  glucose 4 gram chewable tablet, Chew 4 tablets by mouth as Needed., Disp: 50 tablet, Rfl: 3  ?  hydrOXYzine (ATARAX) 25 mg tablet, Take 25 mg by mouth three times daily as needed for Itching., Disp: , Rfl:   ?  ibuprofen (ADVIL) 200 mg tablet, Take 200 mg by mouth every 6 hours as needed for Pain. Take with food., Disp: , Rfl:   ?  insulin lispro (HUMALOG KWIKPEN) 100 unit/mL injection PEN, Inject twenty Units under the skin three times daily with meals., Disp: 60 mL, Rfl: 3  ?  insulin pen needles (disposable) (B-D NANO; ULTICARE MICRO) 32 gauge x 5/32 pen needle, Use one each as directed five times daily. Use with insulin injections., Disp: 500 each, Rfl: 3  ?  LANTUS SOLOSTAR U-100 INSULIN 100 unit/mL (3 mL) injection PEN, Inject forty Units under the skin twice daily., Disp: 75 mL, Rfl: 3  ?  lisinopril (PRINIVIL, ZESTRIL) 40 mg tablet, Take 40 mg by mouth daily., Disp: , Rfl:   ?  metFORMIN-XR(+) (GLUCOPHAGE XR) 500 mg extended release tablet, Take four tablets by mouth daily with dinner., Disp: 360 tablet, Rfl: 3  ?  MYRBETRIQ 50 mg tablet, Take 1 tablet by mouth once daily, Disp: 90 tablet, Rfl: 0  ?  oxyCODONE/acetaminophen (PERCOCET) 5/325 mg tablet, Take one tablet by mouth at bedtime as needed for Pain, Disp: 15 tablet, Rfl: 0  ?  semaglutide (OZEMPIC) 0.25 mg or 0.5 mg(2 mg/1.5 mL) injection PEN, Inject one-half mg under the skin every 7 days., Disp: 5 mL, Rfl: 3    Current Facility-Administered Medications:   ?  dexamethasone PF (DECADRON) injection 15 mg, 15 mg, SEE ADMIN INSTRUCTIONS, ONCE, Harbor Vanover, MD  ?  iohexoL (OMNIPAQUE-300) 300 mg/mL injection 2 mL, 2 mL, SEE ADMIN INSTRUCTIONS, ONCE, Meghin Thivierge, MD  ?  lidocaine PF 1% (10 mg/mL) injection 2 mL, 2 mL, Injection, ONCE, Karman Biswell, MD    Physical examination:   There were no vitals taken for this visit.  General: The patient is a well-developed, well nourished 68 y.o. male in no acute distress.   HEENT: Head is normocephalic and atraumatic. Pupils are equal and reactive to light bilaterally. Cardiac: Based on palpation, pulse appears to be regular rate and rhythm. There is TTP along the 5th-7th ribs with allodynia most pronounced along the T6 dermatome.   Pulmonary: The patient has unlabored respirations and bilateral symmetric chest excursion.   Abdomen: Soft, nontender, and nondistended. There is no rebound or guarding.   Extremities: No clubbing, cyanosis, or edema.     Neurologic:   The patient is alert and oriented times 3.   Cranial nerves II through XII are intact without any focal deficits.     Musculoskeletal:   Gait is normal.    L-Spine   There is no paraspinal tenderness. Paraspinal muscle tone is normal.  There is no tenderness or radiating pain with palpation over the SI joints, piriformis, or greater trochanteric bursae bilaterally.  ROM with flexion, extension, rotation, and lateral bending is intact.  Strength is equal and adequate bilaterally in the flexors and extensors of the bilateral lower extremities.   SLR is negative bilaterally.      MRI L-Spine Results:    Results for orders placed during the hospital encounter of 11/24/16   MRI L-SPINE WO CONTRAST    Narrative MRI L-SPINE WO CONTRAST    Clinical indication: Lumbosacral radiculopathy.    Technique: MRI of the lumbar spine is performed without contrast.    Contrast: None    Comparison: CTA of the abdomen and pelvis from March 01, 2016    Findings:    There is straightening of lumbar lordosis. The vertebral body heights are well-maintained without a suspicious geographic marrow lesion. There is disc degeneration and discogenic endplate changes from L3 through S1. The conus terminates normally. The nerve roots of the cauda equina are normal in appearance. The prevertebral and paraspinal soft tissues are unremarkable.    L1-L2: Normal.    L2-L3: No spinal canal or neural foraminal narrowing.    L3-L4: Symmetrically bulging disc and prominent posterior epidural fat in mild spinal canal and bilateral neural foraminal narrowing. L4-L5: Symmetrically bulging disc with prominent posterior epidural fat, facet arthropathy, and ligamentum flavum laxity results in moderate spinal canal and right neural foraminal narrowing. There is mild left neural foraminal compromise.    L5-S1: Symmetrically bulging disc and facet arthropathy results in mild right subarticular zone and moderate bilateral neural foraminal narrowing.      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       Finalized by Wandra Mannan, M.D. on 11/26/2016 8:02 AM. Dictated by Wandra Mannan, M.D. on 11/26/2016 7:57 AM.         Last Cr and LFT's:  Creatinine   Date Value Ref Range Status   02/22/2019 0.94 0.4 - 1.24 MG/DL Final     AST (SGOT)   Date Value Ref Range Status   02/22/2019 16 7 - 40 U/L Final     ALT (SGPT)   Date Value Ref Range Status   02/22/2019 23 7 - 56 U/L Final     Alk Phosphatase   Date Value Ref Range Status   02/22/2019 47 25 - 110 U/L Final     Total Bilirubin   Date Value  Ref Range Status   02/22/2019 0.6 0.3 - 1.2 MG/DL Final          Assessment:    BASILIOS BRABEC is a 68 y.o. male who  has a past medical history of Accidental fall, Arthritis, Cataract, DM (diabetes mellitus) (HCC), Hyperlipidemia, and Hypertension. who presents for evaluation of multifocal pain.     The pain complaints are most likely due to:    1. Intercostal neuralgia  [place] AMB NERVE BLOCK    [place] AMB NERVE BLOCK    lidocaine PF 1% (10 mg/mL) injection 2 mL    iohexoL (OMNIPAQUE-300) 300 mg/mL injection 2 mL    dexamethasone PF (DECADRON) injection 15 mg   2. PHN (postherpetic neuralgia)  [place] AMB NERVE BLOCK    [place] AMB NERVE BLOCK    lidocaine PF 1% (10 mg/mL) injection 2 mL    iohexoL (OMNIPAQUE-300) 300 mg/mL injection 2 mL dexamethasone PF (DECADRON) injection 15 mg   3. Neuropathic pain  [place] AMB NERVE BLOCK     AMB NERVE BLOCK    lidocaine PF 1% (10 mg/mL) injection 2 mL    iohexoL (OMNIPAQUE-300) 300 mg/mL injection 2 mL    dexamethasone PF (DECADRON) injection 15 mg       Patient has had an adequate trial of > 3 months of rest, exercise, NSAID's, multimodal treatment, and the passage of time without improvement of symptoms. The pain has significant impact on the daily quality of life.     Plan:    1. Will increase gabapentin to 300/300/600 and could consider 600/300/600 next.   2. If he fails to improve with gabapentin, we can consider Lyrica next.   3. Plan for Right 5th-7th intercostal nerve blocks with U/S in Clinic.   4. Will trial lidocaine ointment. He does not feel capsaicin has helped.   5. Follow up as needed.     Risks/benefits of all pharmacologic and interventional treatments discussed and questions answered.

## 2019-03-19 NOTE — Discharge Instructions - Supplementary Instructions
..  GENERAL POST PROCEDURE INSTRUCTIONS    Physician: _________________________________    Procedure Completed Today:  o Joint Injection (hip, knee, shoulder)  o Cervical Epidural Steroid Injection  o Cervical Transforaminal Steroid Injection  o Trigger Point Injection  o Other___________________________ o Thoracic Epidural Steroid Injection  o Lumbar Epidural Steroid Injection  o Lumbar Transforaminal Steroid Injection  o Facet Joint Injection     Important information following your procedure today:  o You may drive today     o If you had sedation, you may NOT drive today  ? Rest at home for the next 6 hours.  You may then begin to resume your normal activities.  ? DO NOT drive any vehicle, operate any power tools, drink alcohol, make any major decisions, or sign any legal documents for the next 12 hours.  1. Pain relief may not be immediate. It is possible you may even experience an increase in pain during the first 24-48 hours followed by a gradual decrease of your pain.  2. Though the procedure is generally safe and complications are rare, we do ask that you be aware of any of the following:  ? Any swelling, persistent redness, new bleeding or drainage from the site of the injection.  ? You should not experience a severe headache.  ? You should not run a fever over 101oF.  ? New onset of sharp, severe back and or neck pain.  ? New onset of upper or lower extremity numbness or weakness.  ? New difficulty controlling bowel or bladder function after injection.  ? New shortness of breath.  If any of these occur, please call to report this occurrence to a nurse at [phone]. If you are calling after 4:00pm, on the weekends or holidays please call [phone] and ask to have the pain management resident physician on call for the physician paged or go to your local emergency room.   3. You may experience soreness at the injection site. Ice can be applied at 20 minute intervals for the first 24 hours. The following day you may alternate ice with heat if you are experiencing muscle tightness, otherwise continue with ice. Ice works best at decreasing pain. Avoid application of direct heat, hot showers or hot tubs today.  4. Avoid strenuous activity today. You many resume your regular activities and exercise tomorrow.  5. Patients with diabetes may see an elevation in blood sugars for 7-10 days after the injection. It is important to pay close attention to your diet, check your blood sugars daily and repost extreme elevations to the physician that treats your diabetes.  6. Patients taking daily blood thinners can resume their regular dose this evening.  7. It is important that you take all medications ordered by your pain physician. Taking medications as ordered is an important part of you pain care plan. If you cannot continue the medication plan, please notify the physician.    Possible side effects to steroids that may occur:  ? Flushing or redness of the face  ? Irritability  ? Fluid retention  ? Change in women's menses      Follow up appointment as needed if in the event you are unable to keep an appointment please notify the scheduler 24 hours in advance at [phone]. If you have questions for the surgery center, call [facility] at [phone].

## 2019-03-26 ENCOUNTER — Encounter: Admit: 2019-03-26 | Discharge: 2019-03-26 | Payer: Medicare Other

## 2019-04-17 ENCOUNTER — Encounter: Admit: 2019-04-17 | Discharge: 2019-04-17 | Payer: Medicare Other

## 2019-04-17 ENCOUNTER — Ambulatory Visit: Admit: 2019-04-17 | Discharge: 2019-04-17 | Payer: Medicare Other

## 2019-04-17 DIAGNOSIS — I10 Essential (primary) hypertension: Secondary | ICD-10-CM

## 2019-04-17 DIAGNOSIS — M898X1 Other specified disorders of bone, shoulder: Secondary | ICD-10-CM

## 2019-04-17 DIAGNOSIS — E119 Type 2 diabetes mellitus without complications: Secondary | ICD-10-CM

## 2019-04-17 DIAGNOSIS — H269 Unspecified cataract: Secondary | ICD-10-CM

## 2019-04-17 DIAGNOSIS — W19XXXA Unspecified fall, initial encounter: Secondary | ICD-10-CM

## 2019-04-17 DIAGNOSIS — M1990 Unspecified osteoarthritis, unspecified site: Secondary | ICD-10-CM

## 2019-04-17 DIAGNOSIS — E785 Hyperlipidemia, unspecified: Secondary | ICD-10-CM

## 2019-04-17 DIAGNOSIS — M792 Neuralgia and neuritis, unspecified: Secondary | ICD-10-CM

## 2019-04-17 DIAGNOSIS — B0229 Other postherpetic nervous system involvement: Secondary | ICD-10-CM

## 2019-04-17 NOTE — Patient Instructions
General Instructions:   How to reach me: Please send a MyChart message to the [facility] or leave a voicemail for my nurse Rebecca at [phone].   Scheduling: Our scheduling phone number is [phone].   How to get a medication refill: Five business days before refill needed, please use the MyChart Refill request or contact your pharmacy directly to request medication refills.    How to receive your test results: If you have signed up for MyChart, you will receive your test results and messages from me this way. Otherwise, you will get a phone call or letter. If you are expecting results and have not heard from my office within 2 weeks of your testing, please send a MyChart message or call my office.   Support for many chronic illnesses is available through [facility]: [email or web address] or [phone].   For questions on nights, weekends or holidays, call the operator at [phone], and ask for the doctor on call for [facility].  

## 2019-04-17 NOTE — Progress Notes
[facility] - Interventional [facility]  Follow Up Visit  Subjective     Chief Complaint: Multifocal pain    HPI: Kevin Obrien is a 68 y.o. male who  has a past medical history of Accidental fall, Arthritis, Cataract, DM (diabetes mellitus) (HCC), Hyperlipidemia, and Hypertension. who now presents for evaluation of pain.     He states that the rib pain has resolved following the intercostal nerve blocks a couple months ago.   He is happy.    However, he has pain in the right scapula. It feels like it is underneath the scapula on the inferior surface on the right.     The pain is described as sharp.   It is rated as 9/10.    There is no clear alleviating or exacerbating factor.     No interval inciting injury/event.     He does not think gabapentin 600mg  tid is helping that much more. No side effects.     He also complains of some pain in the right foot on the medial aspect and medial distal leg. I advised him to follow up with his PCP. He states that workup for DVT, etc was negative.        PRIOR MEDICATIONS:   Effective  Gabapentin  Cymbalta    Ineffective  Nortriptyline  Capsaicin oint  NSAID  Acetaminophen    Unable to tolerate      Never  Lyrica  Tizanidine      PRIOR INTERVENTIONS:  No spine surgery   Effective  Bilateral TAP block   Right intercostal nerve blocks    Ineffective  Exercise, pain limited        Kevin Obrien denies any recent fevers, chills, infection, antibiotics, bowel or bladder incontinence, saddle anesthesia, bleeding issues, or recent anticoagulant. ASA 81mg     ROS: All 14 systems reviewed and found to be negative except as above and as follows. +Dizziness.     Past Medical History:  Medical History:   Diagnosis Date   ? Accidental fall    ? Arthritis    ? Cataract    ? DM (diabetes mellitus) (HCC)    ? Hyperlipidemia    ? Hypertension        Family History:  Family History   Problem Relation Age of Onset   ? Diabetes Mother    ? Diabetes Father    ? Cancer-Lung Father ? Melanoma Neg Hx    ? Cataract Neg Hx    ? Glaucoma Neg Hx    ? Macular Degen Neg Hx        Social History:  Lives in [place], [place].   Social History     Socioeconomic History   ? Marital status: Separated     Spouse name: Not on file   ? Number of children: 3   ? Years of education: Not on file   ? Highest education level: Not on file   Occupational History   ? Not on file   Tobacco Use   ? Smoking status: Former Smoker     Last attempt to quit: 04/12/1971     Years since quitting: 48.0   ? Smokeless tobacco: Never Used   Substance and Sexual Activity   ? Alcohol use: Not Currently   ? Drug use: Never   ? Sexual activity: Not on file   Other Topics Concern   ? Not on file   Social History Narrative   ? Not on file  Allergies:  No Known Allergies    Medications:    Current Outpatient Medications:   ?  amLODIPine (NORVASC) 10 mg tablet, Take 1 tablet by mouth daily. (Patient taking differently: Take 5 mg by mouth daily.), Disp: 90 tablet, Rfl: 3  ?  aspirin 81 mg chewable tablet, Take 81 mg by mouth daily.  , Disp: , Rfl:   ?  atorvastatin (LIPITOR) 40 mg tablet, Take 40 mg by mouth daily., Disp: , Rfl:   ?  capsaicin 0.025 % topical cream, Apply on the affected area for neuropathic pain. Wash hands after application, Disp: 21 g, Rfl: 3  ?  cephalexin (KEFLEX) 500 mg capsule, Take 500 mg by mouth four times daily., Disp: , Rfl:   ?  duloxetine DR (CYMBALTA) 30 mg capsule, Take one capsule by mouth twice daily. Indications: neuropathic pain, Disp: 60 capsule, Rfl: 3  ?  gabapentin (NEURONTIN) 300 mg capsule, Take one capsule by mouth three times daily., Disp: 90 capsule, Rfl: 5  ?  glucose 4 gram chewable tablet, Chew 4 tablets by mouth as Needed., Disp: 50 tablet, Rfl: 3  ?  hydrOXYzine (ATARAX) 25 mg tablet, Take 25 mg by mouth three times daily as needed for Itching., Disp: , Rfl:   ?  ibuprofen (ADVIL) 200 mg tablet, Take 200 mg by mouth every 6 hours as needed for Pain. Take with food., Disp: , Rfl: ?  insulin lispro (HUMALOG KWIKPEN) 100 unit/mL injection PEN, Inject twenty Units under the skin three times daily with meals., Disp: 60 mL, Rfl: 3  ?  insulin pen needles (disposable) (B-D NANO; ULTICARE MICRO) 32 gauge x 5/32 pen needle, Use one each as directed five times daily. Use with insulin injections., Disp: 500 each, Rfl: 3  ?  LANTUS SOLOSTAR U-100 INSULIN 100 unit/mL (3 mL) injection PEN, Inject forty Units under the skin twice daily., Disp: 75 mL, Rfl: 3  ?  lisinopril (PRINIVIL, ZESTRIL) 40 mg tablet, Take 40 mg by mouth daily., Disp: , Rfl:   ?  metFORMIN-XR(+) (GLUCOPHAGE XR) 500 mg extended release tablet, Take four tablets by mouth daily with dinner., Disp: 360 tablet, Rfl: 3  ?  MYRBETRIQ 50 mg tablet, Take 1 tablet by mouth once daily, Disp: 90 tablet, Rfl: 0  ?  oxyCODONE/acetaminophen (PERCOCET) 5/325 mg tablet, Take one tablet by mouth at bedtime as needed for Pain, Disp: 15 tablet, Rfl: 0  ?  semaglutide (OZEMPIC) 0.25 mg or 0.5 mg(2 mg/1.5 mL) injection PEN, Inject one-half mg under the skin every 7 days., Disp: 5 mL, Rfl: 3    Physical examination:   BP 128/68  - Pulse 104  - Ht 165.1 cm (65)  - Wt 59 kg (130 lb)  - SpO2 100%  - BMI 21.63 kg/m?   Pain Score: Eight    General: The patient is a well-developed, well nourished 68 y.o. male in no acute distress.   HEENT: Head is normocephalic and atraumatic. Pupils are equal and reactive to light bilaterally.   Cardiac: Based on palpation, pulse appears to be regular rate and rhythm. There is TTP along the right scapula on the inferior border. There is also pain with deep palpation over the suprascapular nerve.    Pulmonary: The patient has unlabored respirations and bilateral symmetric chest excursion.   Abdomen: Soft, nontender, and nondistended. There is no rebound or guarding.   Extremities: No clubbing, cyanosis, or edema.     Neurologic:   The patient is alert and oriented  times 3. Cranial nerves II through XII are intact without any focal deficits.     Musculoskeletal:   Gait is normal.    L-Spine   There is no paraspinal tenderness. Paraspinal muscle tone is normal.  There is no tenderness or radiating pain with palpation over the SI joints, piriformis, or greater trochanteric bursae bilaterally.  ROM with flexion, extension, rotation, and lateral bending is intact.  Strength is equal and adequate bilaterally in the flexors and extensors of the bilateral lower extremities.   SLR is negative bilaterally.      MRI L-Spine Results:    Results for orders placed during the hospital encounter of 11/24/16   MRI L-SPINE WO CONTRAST    Narrative MRI L-SPINE WO CONTRAST    Clinical indication: Lumbosacral radiculopathy.    Technique: MRI of the lumbar spine is performed without contrast.    Contrast: None    Comparison: CTA of the abdomen and pelvis from March 01, 2016    Findings:    There is straightening of lumbar lordosis. The vertebral body heights are well-maintained without a suspicious geographic marrow lesion. There is disc degeneration and discogenic endplate changes from L3 through S1. The conus terminates normally. The nerve roots of the cauda equina are normal in appearance. The prevertebral and paraspinal soft tissues are unremarkable.    L1-L2: Normal.    L2-L3: No spinal canal or neural foraminal narrowing.    L3-L4: Symmetrically bulging disc and prominent posterior epidural fat in mild spinal canal and bilateral neural foraminal narrowing.    L4-L5: Symmetrically bulging disc with prominent posterior epidural fat, facet arthropathy, and ligamentum flavum laxity results in moderate spinal canal and right neural foraminal narrowing. There is mild left neural foraminal compromise.    L5-S1: Symmetrically bulging disc and facet arthropathy results in mild right subarticular zone and moderate bilateral neural foraminal narrowing. 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       Finalized by Kevin Obrien, M.D. on 11/26/2016 8:02 AM. Dictated by Kevin Obrien, M.D. on 11/26/2016 7:57 AM.         Last Cr and LFT's:  Creatinine   Date Value Ref Range Status   02/22/2019 0.94 0.4 - 1.24 MG/DL Final     AST (SGOT)   Date Value Ref Range Status   02/22/2019 16 7 - 40 U/L Final     ALT (SGPT)   Date Value Ref Range Status   02/22/2019 23 7 - 56 U/L Final     Alk Phosphatase   Date Value Ref Range Status   02/22/2019 47 25 - 110 U/L Final     Total Bilirubin   Date Value Ref Range Status   02/22/2019 0.6 0.3 - 1.2 MG/DL Final          Assessment:    RUFORD IMBURGIA is a 68 y.o. male who  has a past medical history of Accidental fall, Arthritis, Cataract, DM (diabetes mellitus) (HCC), Hyperlipidemia, and Hypertension. who presents for evaluation of multifocal pain.     The pain complaints are most likely due to:    1. Pain of right scapula     2. Neuropathic pain     3. PHN (postherpetic neuralgia)         Patient has had an adequate trial of >  3 months of rest, exercise, NSAID's, multimodal treatment, and the passage of time without improvement of symptoms. The pain has significant impact on the daily quality of life.     Plan:    1. Will increase gabapentin to 600/600/900.   2. If he fails to improve with gabapentin, we can consider Lyrica next.   3. Plan for Right suprascapular and subscapular nerve block with U/S in Clinic.   4. Follow up as needed.     Risks/benefits of all pharmacologic and interventional treatments discussed and questions answered.

## 2019-05-21 NOTE — Progress Notes
Date of Service: 05/25/2019     Subjective:             Kevin Obrien is a 68 y.o. male    History of Present Illness    68 yo M previously seen by Dr. Oswaldo Milian for urgency and frequency, last seen 05/29/19 by Dr. Penelope Coop   History of poorly controlled DM with A1c >12%, most recently 12.2% in Nov.  Also with lumbosacral radiculopathy (L3-S1, L4-5) and constipation.      Patient reports following his last appointment noticing a decrease in pressure with his stream.  Patient reports that his stream often begins lower and then returned to normal.  He describes his stream is weak.  He reports when the bathroom every 2 hours without urgency or leakage.  He reports nocturia x3.  He does endorse pushing about half the time and having hesitancy.  He does not feel as though he empties bladder completely.  He denies urinary tract infections, dysuria, hematuria over the last year.  He continues to take Myrbetriq 50 mg daily.  He reports that he may have previously been taken medication for his prostate does not believe he is taking one now.  ?  Patient reports ongoing management for erectile dysfunction with sildenafil 100 mg.  Patient reports most often he cuts the pills in half and has satisfactory results.  He reports he able to get him maintain full erections without difficulty.  He denies priapism, adverse effects.  Wishes for additional refill today.    PVR: 44 mL    Medical History:   Diagnosis Date   ? Accidental fall    ? Arthritis    ? Cataract    ? DM (diabetes mellitus) (HCC)    ? Hyperlipidemia    ? Hypertension        Surgical History:   Procedure Laterality Date   ? CORONARY STENT PLACEMENT  2016   ? HX CHOLECYSTECTOMY  09/2015   ? COLONOSCOPY N/A 04/20/2016    Performed by Vertell Novak[facility] MD at [facility] ENDO   ? ESOPHAGOGASTRODUODENOSCOPY N/A 04/20/2016    Performed by Vertell Novak, MD at [facility]   ? ESOPHAGOGASTRODUODENOSCOPY BIOPSY  04/20/2016    Performed by Vertell Novak, MD at [facility] END[facility] ? UREA BREATH TEST N/A 11/13/2016    Performed by Jolee Ewing, MD at [facility] ENDO       Family History   Problem Relation Age of Onset   ? Diabetes Mother    ? Diabetes Father    ? Cancer-Lung Father    ? Melanoma Neg Hx    ? Cataract Neg Hx    ? Glaucoma Neg Hx    ? Macular Degen Neg Hx        Current Outpatient Medications   Medication Sig Dispense Refill   ? amLODIPine (NORVASC) 10 mg tablet Take 1 tablet by mouth daily. (Patient taking differently: Take 5 mg by mouth daily.) 90 tablet 3   ? aspirin 81 mg chewable tablet Take 81 mg by mouth daily.       ? atorvastatin (LIPITOR) 40 mg tablet Take 40 mg by mouth daily.     ? capsaicin 0.025 % topical cream Apply on the affected area for neuropathic pain.  Wash hands after application 21 g 3   ? cephalexin (KEFLEX) 500 mg capsule Take 500 mg by mouth four times daily.     ? duloxetine DR (CYMBALTA) 30 mg capsule Take one capsule by mouth twice daily.  Indications: neuropathic pain 60 capsule 3   ? gabapentin (NEURONTIN) 300 mg capsule Take one capsule by mouth three times daily. (Patient taking differently: Take 400 mg by mouth three times daily.) 90 capsule 5   ? glucose 4 gram chewable tablet Chew 4 tablets by mouth as Needed. 50 tablet 3   ? hydrOXYzine (ATARAX) 25 mg tablet Take 25 mg by mouth three times daily as needed for Itching.     ? ibuprofen (ADVIL) 200 mg tablet Take 200 mg by mouth every 6 hours as needed for Pain. Take with food.     ? insulin lispro (HUMALOG KWIKPEN) 100 unit/mL injection PEN Inject twenty Units under the skin three times daily with meals. 60 mL 3   ? insulin pen needles (disposable) (B-D NANO; ULTICARE MICRO) 32 gauge x 5/32 pen needle Use one each as directed five times daily. Use with insulin injections. 500 each 3   ? LANTUS SOLOSTAR U-100 INSULIN 100 unit/mL (3 mL) injection PEN Inject forty Units under the skin twice daily. 75 mL 3   ? lisinopril (PRINIVIL, ZESTRIL) 40 mg tablet Take 40 mg by mouth daily. ? metFORMIN-XR(+) (GLUCOPHAGE XR) 500 mg extended release tablet Take four tablets by mouth daily with dinner. 360 tablet 3   ? MYRBETRIQ 50 mg tablet Take one tablet by mouth daily. 90 tablet 0   ? oxyCODONE/acetaminophen (PERCOCET) 5/325 mg tablet Take one tablet by mouth at bedtime as needed for Pain 15 tablet 0   ? semaglutide (OZEMPIC) 0.25 mg or 0.5 mg(2 mg/1.5 mL) injection PEN Inject one-half mg under the skin every 7 days. 5 mL 3   ? sildenafiL (VIAGRA) 100 mg tablet Take one tablet by mouth as Needed for Erectile dysfunction. Take 1-4 hrs prior to sexual activity.  Take on empty stomach.  Indications: the inability to have an erection 30 tablet 11   ? tamsulosin (FLOMAX) 0.4 mg capsule Take one capsule by mouth daily. Take 30 min after same meal.  Do not cut/ crush/ chew. 90 capsule 3     No current facility-administered medications for this visit.        No Known Allergies    Social History     Socioeconomic History   ? Marital status: Separated     Spouse name: Not on file   ? Number of children: 3   ? Years of education: Not on file   ? Highest education level: Not on file   Occupational History   ? Not on file   Tobacco Use   ? Smoking status: Former Smoker     Quit date: 04/12/1971     Years since quitting: 48.1   ? Smokeless tobacco: Never Used   Substance and Sexual Activity   ? Alcohol use: Not Currently   ? Drug use: Never   ? Sexual activity: Not on file   Other Topics Concern   ? Not on file   Social History Narrative   ? Not on file         Review of Systems   Constitutional: Negative for activity change, appetite change, chills, diaphoresis, fatigue, fever and unexpected weight change.   HENT: Negative for congestion, hearing loss, mouth sores and sinus pressure.    Eyes: Negative for visual disturbance.   Respiratory: Negative for apnea, cough, chest tightness and shortness of breath.    Cardiovascular: Negative for chest pain, palpitations and leg swelling. Gastrointestinal: Negative for abdominal pain, blood in stool, constipation, diarrhea, nausea,  rectal pain and vomiting.   Genitourinary: Negative for decreased urine volume, difficulty urinating, discharge, dysuria, enuresis, flank pain, frequency, genital sores, hematuria, penile pain, penile swelling, scrotal swelling, testicular pain and urgency.   Musculoskeletal: Negative for arthralgias, back pain, gait problem and myalgias.   Skin: Negative for rash and wound.   Neurological: Negative for dizziness, tremors, seizures, syncope, weakness, light-headedness, numbness and headaches.   Hematological: Negative for adenopathy. Does not bruise/bleed easily.   Psychiatric/Behavioral: Negative for decreased concentration and dysphoric mood. The patient is not nervous/anxious.        Objective:         ? amLODIPine (NORVASC) 10 mg tablet Take 1 tablet by mouth daily. (Patient taking differently: Take 5 mg by mouth daily.)   ? aspirin 81 mg chewable tablet Take 81 mg by mouth daily.     ? atorvastatin (LIPITOR) 40 mg tablet Take 40 mg by mouth daily.   ? capsaicin 0.025 % topical cream Apply on the affected area for neuropathic pain.  Wash hands after application   ? cephalexin (KEFLEX) 500 mg capsule Take 500 mg by mouth four times daily.   ? duloxetine DR (CYMBALTA) 30 mg capsule Take one capsule by mouth twice daily. Indications: neuropathic pain   ? gabapentin (NEURONTIN) 300 mg capsule Take one capsule by mouth three times daily. (Patient taking differently: Take 400 mg by mouth three times daily.)   ? glucose 4 gram chewable tablet Chew 4 tablets by mouth as Needed.   ? hydrOXYzine (ATARAX) 25 mg tablet Take 25 mg by mouth three times daily as needed for Itching.   ? ibuprofen (ADVIL) 200 mg tablet Take 200 mg by mouth every 6 hours as needed for Pain. Take with food.   ? insulin lispro (HUMALOG KWIKPEN) 100 unit/mL injection PEN Inject twenty Units under the skin three times daily with meals. ? insulin pen needles (disposable) (B-D NANO; ULTICARE MICRO) 32 gauge x 5/32 pen needle Use one each as directed five times daily. Use with insulin injections.   ? LANTUS SOLOSTAR U-100 INSULIN 100 unit/mL (3 mL) injection PEN Inject forty Units under the skin twice daily.   ? lisinopril (PRINIVIL, ZESTRIL) 40 mg tablet Take 40 mg by mouth daily.   ? metFORMIN-XR(+) (GLUCOPHAGE XR) 500 mg extended release tablet Take four tablets by mouth daily with dinner.   ? MYRBETRIQ 50 mg tablet Take one tablet by mouth daily.   ? oxyCODONE/acetaminophen (PERCOCET) 5/325 mg tablet Take one tablet by mouth at bedtime as needed for Pain   ? semaglutide (OZEMPIC) 0.25 mg or 0.5 mg(2 mg/1.5 mL) injection PEN Inject one-half mg under the skin every 7 days.   ? sildenafiL (VIAGRA) 100 mg tablet Take one tablet by mouth as Needed for Erectile dysfunction. Take 1-4 hrs prior to sexual activity.  Take on empty stomach.  Indications: the inability to have an erection   ? tamsulosin (FLOMAX) 0.4 mg capsule Take one capsule by mouth daily. Take 30 min after same meal.  Do not cut/ crush/ chew.     Vitals:    05/25/19 1316   BP: (!) 150/59   BP Source: Arm, Left Upper   Patient Position: Sitting   Pulse: 85   Temp: 36.6 ?C (97.9 ?F)   TempSrc: Temporal   Weight: 59 kg (130 lb)   Height: 165.1 cm (65)   PainSc: Zero     Body mass index is 21.63 kg/m?Marland Kitchen       Physical Exam  Constitutional: He is oriented to person, place, and time. He appears well-developed and well-nourished. No distress.   HENT:   Head: Normocephalic and atraumatic.   Eyes: Conjunctivae are normal. Right eye exhibits no discharge. Left eye exhibits no discharge.   Neck: Normal range of motion.   Cardiovascular: Normal rate.   Pulmonary/Chest: Effort normal. No respiratory distress.   Musculoskeletal:         General: No deformity or edema.   Neurological: He is alert and oriented to person, place, and time.   Skin: Skin is warm and dry. He is not diaphoretic. Psychiatric: He has a normal mood and affect. His behavior is normal. Judgment and thought content normal.          Assessment and Plan:    Problem   Weak Urinary Stream   Erectile Dysfunction     Weak urinary stream  Discussed possible etiology for obstructive LUTS & elevated post-void residual volume.  Discussed watchful waiting and continuing to monitor symptoms.  Discussed medications class including Alpha1-BlockersProvided information about the mechanism of action, risks, benefits and adverse effects.    After significant consideration, the patient would like to proceed with Flomax 0.4 mg once daily.  Plan to continue taking Myrbetriq 50 mg daily to aid with urinary frequency and urgency.  All questions concerns answered at this time.  We will plan to follow-up in 3 months for evaluation of lower urinary tract symptoms.      Erectile dysfunction  Patient reports satisfactory results while taking sildenafil 100 mg to 50 mg without any adverse effects.  Discussed ED tx options:  -- PDE-5 inhibitors    Plan to continue sildenafil 100 mg 1 tablet as needed. Instructed patient to take this medication on an empty stomach at least 1-4 hours prior to sexual intercourse. Recommend check pharmacy price comparison web site such as, [employer].      Orders Placed This Encounter   ? MYRBETRIQ 50 mg tablet   ? tamsulosin (FLOMAX) 0.4 mg capsule   ? sildenafiL (VIAGRA) 100 mg tablet     Return in about 3 months (around 08/23/2019) for BPH.      Abby Potash, APRN-NP  Department of Urology

## 2019-05-25 ENCOUNTER — Encounter: Admit: 2019-05-25 | Discharge: 2019-05-25 | Payer: MEDICARE

## 2019-05-25 ENCOUNTER — Ambulatory Visit: Admit: 2019-05-25 | Discharge: 2019-05-26 | Payer: MEDICARE

## 2019-05-25 DIAGNOSIS — I10 Essential (primary) hypertension: Secondary | ICD-10-CM

## 2019-05-25 DIAGNOSIS — E119 Type 2 diabetes mellitus without complications: Secondary | ICD-10-CM

## 2019-05-25 DIAGNOSIS — H269 Unspecified cataract: Secondary | ICD-10-CM

## 2019-05-25 DIAGNOSIS — M1990 Unspecified osteoarthritis, unspecified site: Secondary | ICD-10-CM

## 2019-05-25 DIAGNOSIS — E785 Hyperlipidemia, unspecified: Secondary | ICD-10-CM

## 2019-05-25 DIAGNOSIS — W19XXXA Unspecified fall, initial encounter: Secondary | ICD-10-CM

## 2019-05-25 DIAGNOSIS — N401 Enlarged prostate with lower urinary tract symptoms: Secondary | ICD-10-CM

## 2019-05-25 MED ORDER — SILDENAFIL 100 MG PO TAB
100 mg | ORAL_TABLET | ORAL | 11 refills | 25.00000 days | Status: AC | PRN
Start: 2019-05-25 — End: ?

## 2019-05-25 MED ORDER — MYRBETRIQ 50 MG PO TB24
50 mg | ORAL_TABLET | Freq: Every day | ORAL | 0 refills | Status: AC
Start: 2019-05-25 — End: ?

## 2019-05-25 MED ORDER — TAMSULOSIN 0.4 MG PO CAP
0.4 mg | ORAL_CAPSULE | Freq: Every day | ORAL | 3 refills | 90.00000 days | Status: AC
Start: 2019-05-25 — End: ?

## 2019-05-25 NOTE — Assessment & Plan Note
Discussed possible etiology for obstructive LUTS & elevated post-void residual volume.  Discussed watchful waiting and continuing to monitor symptoms.  Discussed medications class including Alpha1-BlockersProvided information about the mechanism of action, risks, benefits and adverse effects.    After significant consideration, the patient would like to proceed with Flomax 0.4 mg once daily.  Plan to continue taking Myrbetriq 50 mg daily to aid with urinary frequency and urgency.  All questions concerns answered at this time.  We will plan to follow-up in 3 months for evaluation of lower urinary tract symptoms.

## 2019-05-25 NOTE — Assessment & Plan Note
Patient reports satisfactory results while taking sildenafil 100 mg to 50 mg without any adverse effects.  Discussed ED tx options:  -- PDE-5 inhibitors    Plan to continue sildenafil 100 mg 1 tablet as needed. Instructed patient to take this medication on an empty stomach at least 1-4 hours prior to sexual intercourse. Recommend check pharmacy price comparison web site such as, [employer].

## 2019-05-25 NOTE — Progress Notes
PVR 41ml

## 2019-05-26 DIAGNOSIS — N528 Other male erectile dysfunction: Secondary | ICD-10-CM

## 2019-05-26 DIAGNOSIS — R3912 Poor urinary stream: Secondary | ICD-10-CM

## 2019-05-26 DIAGNOSIS — R350 Frequency of micturition: Principal | ICD-10-CM

## 2019-06-01 ENCOUNTER — Encounter: Admit: 2019-06-01 | Discharge: 2019-06-01 | Payer: MEDICARE

## 2019-06-01 ENCOUNTER — Ambulatory Visit: Admit: 2019-06-01 | Discharge: 2019-06-02 | Payer: MEDICARE

## 2019-06-01 DIAGNOSIS — W19XXXA Unspecified fall, initial encounter: Secondary | ICD-10-CM

## 2019-06-01 DIAGNOSIS — M1990 Unspecified osteoarthritis, unspecified site: Secondary | ICD-10-CM

## 2019-06-01 DIAGNOSIS — Z794 Long term (current) use of insulin: Secondary | ICD-10-CM

## 2019-06-01 DIAGNOSIS — E1142 Type 2 diabetes mellitus with diabetic polyneuropathy: Secondary | ICD-10-CM

## 2019-06-01 DIAGNOSIS — E119 Type 2 diabetes mellitus without complications: Secondary | ICD-10-CM

## 2019-06-01 DIAGNOSIS — I10 Essential (primary) hypertension: Secondary | ICD-10-CM

## 2019-06-01 DIAGNOSIS — E1159 Type 2 diabetes mellitus with other circulatory complications: Secondary | ICD-10-CM

## 2019-06-01 DIAGNOSIS — H269 Unspecified cataract: Secondary | ICD-10-CM

## 2019-06-01 DIAGNOSIS — E782 Mixed hyperlipidemia: Secondary | ICD-10-CM

## 2019-06-01 DIAGNOSIS — R739 Hyperglycemia, unspecified: Secondary | ICD-10-CM

## 2019-06-01 DIAGNOSIS — E785 Hyperlipidemia, unspecified: Secondary | ICD-10-CM

## 2019-06-01 DIAGNOSIS — E113299 Type 2 diabetes mellitus with mild nonproliferative diabetic retinopathy without macular edema, unspecified eye: Secondary | ICD-10-CM

## 2019-06-01 MED ORDER — SEMAGLUTIDE 1 MG/DOSE (2 MG/1.5 ML) SC PNIJ
1 mg | SUBCUTANEOUS | 3 refills | Status: AC
Start: 2019-06-01 — End: ?

## 2019-06-01 MED ORDER — FREESTYLE LIBRE 2 READER MISC MISC
ORAL | 0 refills | Status: AC
Start: 2019-06-01 — End: ?

## 2019-06-01 MED ORDER — FREESTYLE LIBRE 2 SENSOR MISC KIT
PACK | ORAL | 3 refills | 30.00000 days | Status: AC
Start: 2019-06-01 — End: ?

## 2019-06-01 MED ORDER — AMITRIPTYLINE 25 MG PO TAB
25 mg | ORAL_TABLET | Freq: Every evening | ORAL | 3 refills | Status: AC
Start: 2019-06-01 — End: ?

## 2019-06-01 MED ORDER — AMITRIPTYLINE 25 MG PO TAB
25 mg | ORAL_TABLET | Freq: Every evening | ORAL | 3 refills | Status: DC
Start: 2019-06-01 — End: 2019-06-01

## 2019-06-01 NOTE — Progress Notes
2111 attempted call to pt for virtual checkin  No answer, LVM requesting return call    Pt arrived at clinic, Called pt to initiate virtual checkin  Pt verbalized understanding of documents to review in mychart message  Verbalized consent for virtual telehealth visit  Pt will click link to be placed into virtual waiting room at time of scheduled office visit

## 2019-06-01 NOTE — Progress Notes
Date of Service: 06/01/2019     Obtained patient's verbal consent to treat them and their agreement to [facility] financial policy and NPP via this telehealth visit during the [facility] Emergency.     Subjective:             Kevin Obrien is a 68 y.o. male presented to the diabetes clinic today for ongoing management of uncontrolled type 2 diabetes.    His other medical problems include history of hypertension, hyperlipidemia and benign prostatic hypertrophy.     History of Present Illness    I spoke with Kevin Obrien over the phone today. Diabetes was diagnosed around 2008.  I have not seen him over the past 2 years.  He is currently taking Lantus 44 units twice daily, Humalog 24 units once a day, he could eat during the day but not necessarily take Humalog with meals.  He is on Ozempic 0.25 mg weekly.  He has not tolerated Metformin in the past.  He reported hyperglycemia ranging between 200 to 250 fasting and throughout the day.  He denied hypoglycemia.  He does have a glucometer at home.    He has history of coronary artery disease status post coronary stenting 2015.  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  He is unable to sleep at night.  He is taking gabapentin 300 mg in the morning, 200 mg midday and 300 mg around 6?7 PM.  He typically goes to bed around 9:30 PM.  He continues to take Lipitor 40 mg daily.  He is also on lisinopril    .   Review of Systems  Review of systems today was positive for pain in bilateral feet, especially at night as well as hyperglycemia.    Medical History:   Diagnosis Date   ? Accidental fall    ? Arthritis    ? Cataract    ? DM (diabetes mellitus) (HCC)    ? Hyperlipidemia    ? Hypertension      Surgical History:   Procedure Laterality Date   ? CORONARY STENT PLACEMENT  2016   ? HX CHOLECYSTECTOMY  09/2015   ? COLONOSCOPY N/A 04/20/2016 Performed by Vertell Novak, MD at [facility] ENDO   ? ESOPHAGOGASTRODUODENOSCOPY N/A 04/20/2016    Performed by Vertell Novak, MD at [facility] ENDO   ? ESOPHAGOGASTRODUODENOSCOPY BIOPSY  04/20/2016    Performed by Vertell Novak, MD at [facility] ENDO   ? UREA BREATH TEST N/A 11/13/2016    Performed by Jolee Ewing, MD at [facility] ENDO     Family History   Problem Relation Age of Onset   ? Diabetes Mother    ? Diabetes Father    ? Cancer-Lung Father    ? Melanoma Neg Hx    ? Cataract Neg Hx    ? Glaucoma Neg Hx    ? Macular Degen Neg Hx      Social History     Socioeconomic History   ? Marital status: Separated     Spouse name: Not on file   ? Number of children: 3   ? Years of education: Not on file   ? Highest education level: Not on file   Occupational History   ? Not on file   Tobacco Use   ? Smoking status: Former Smoker     Quit date: 04/12/1971     Years since quitting: 48.1   ?  Smokeless tobacco: Never Used   Substance and Sexual Activity   ? Alcohol use: Not Currently   ? Drug use: Never   ? Sexual activity: Not on file   Other Topics Concern   ? Not on file   Social History Narrative   ? Not on file     Objective:         ? amLODIPine (NORVASC) 10 mg tablet Take 1 tablet by mouth daily. (Patient taking differently: Take 5 mg by mouth daily.)   ? aspirin 81 mg chewable tablet Take 81 mg by mouth daily.     ? atorvastatin (LIPITOR) 40 mg tablet Take 40 mg by mouth daily.   ? capsaicin 0.025 % topical cream Apply on the affected area for neuropathic pain.  Wash hands after application   ? cephalexin (KEFLEX) 500 mg capsule Take 500 mg by mouth four times daily.   ? duloxetine DR (CYMBALTA) 30 mg capsule Take one capsule by mouth twice daily. Indications: neuropathic pain   ? gabapentin (NEURONTIN) 300 mg capsule Take one capsule by mouth three times daily. (Patient taking differently: Take 400 mg by mouth three times daily.)   ? glucose 4 gram chewable tablet Chew 4 tablets by mouth as Needed. ? hydrOXYzine (ATARAX) 25 mg tablet Take 25 mg by mouth three times daily as needed for Itching.   ? ibuprofen (ADVIL) 200 mg tablet Take 200 mg by mouth every 6 hours as needed for Pain. Take with food.   ? insulin lispro (HUMALOG KWIKPEN) 100 unit/mL injection PEN Inject twenty Units under the skin three times daily with meals.   ? insulin pen needles (disposable) (B-D NANO; ULTICARE MICRO) 32 gauge x 5/32 pen needle Use one each as directed five times daily. Use with insulin injections.   ? LANTUS SOLOSTAR U-100 INSULIN 100 unit/mL (3 mL) injection PEN Inject forty Units under the skin twice daily.   ? lisinopril (PRINIVIL, ZESTRIL) 40 mg tablet Take 40 mg by mouth daily.   ? metFORMIN-XR(+) (GLUCOPHAGE XR) 500 mg extended release tablet Take four tablets by mouth daily with dinner.   ? MYRBETRIQ 50 mg tablet Take one tablet by mouth daily.   ? oxyCODONE/acetaminophen (PERCOCET) 5/325 mg tablet Take one tablet by mouth at bedtime as needed for Pain   ? semaglutide (OZEMPIC) 0.25 mg or 0.5 mg(2 mg/1.5 mL) injection PEN Inject one-half mg under the skin every 7 days.   ? sildenafiL (VIAGRA) 100 mg tablet Take one tablet by mouth as Needed for Erectile dysfunction. Take 1-4 hrs prior to sexual activity.  Take on empty stomach.  Indications: the inability to have an erection   ? tamsulosin (FLOMAX) 0.4 mg capsule Take one capsule by mouth daily. Take 30 min after same meal.  Do not cut/ crush/ chew.     There were no vitals filed for this visit.  There is no height or weight on file to calculate BMI.     Physical Exam              Labs reviewed.     Assessment and Plan:    Diabetes mellitus type 2,  Un- controlled   ? Most recent hemoglobin A1c in November 2019 was elevated of 12.2%.  ? He is currently on Lantus 44 units twice daily, Humalog 24 units once a day, Ozempic 0.25 mg weekly.  ? We discussed glycemic targets.  I have sent him a prescription for a continuous glucose monitor as well today. ?  He will take 18 units of Humalog with any meal he eats.  ? Will increase Ozempic to 0.5 mg for 2 weeks then to 1 mg weekly.  ? Check hemoglobin A1c          Diabetic complication assessment:   ? Urine microalbumin/Cr elevated urine microalbumin/creatinine ratio in November 2018.  ? Annual labs (electrolytes and renal function): Normal kidney function in September 2020  ?  Thyroid function testing : Normal September 2020    Diabetic retinopathy  ? Most recent eye exam in August 2020    Neuropathy  ? Uncontrolled symptoms on current gabapentin dose of 300/200/300 mg.  Symptoms mainly at night.  ? We will do a trial of amitriptyline 25 mg daily at bedtime.  He will also move evening dose of gabapentin to be close to bedtime.    Hyperlipidemia   ? Continue current Lipitor dose 40 mg daily  ? Repeat lipid profile before his next appointment    Hypertension   ? He is on lisinopril    Return to clinic in 3 months    Orders Placed This Encounter   ? HEMOGLOBIN A1C   ? LIPID PROFILE   ? amitriptyline (ELAVIL) 25 mg tablet   ? semaglutide (OZEMPIC) 1 mg/dose (2 mg/1.5 mL) injection PEN   ? flash glucose sensor (FREESTYLE LIBRE 2 SENSOR) kit   ? flash glucose scanning reader (FREESTYLE LIBRE 2 READER) misc                                                      30 minutes spent on this patient's encounter with counseling and coordination of care taking >50% of the visit.

## 2019-06-22 ENCOUNTER — Encounter: Admit: 2019-06-22 | Discharge: 2019-06-22 | Payer: MEDICARE

## 2019-06-22 ENCOUNTER — Ambulatory Visit: Admit: 2019-06-22 | Discharge: 2019-06-22 | Payer: MEDICARE

## 2019-06-22 DIAGNOSIS — M1990 Unspecified osteoarthritis, unspecified site: Secondary | ICD-10-CM

## 2019-06-22 DIAGNOSIS — M898X1 Other specified disorders of bone, shoulder: Secondary | ICD-10-CM

## 2019-06-22 DIAGNOSIS — M792 Neuralgia and neuritis, unspecified: Secondary | ICD-10-CM

## 2019-06-22 DIAGNOSIS — W19XXXA Unspecified fall, initial encounter: Secondary | ICD-10-CM

## 2019-06-22 DIAGNOSIS — M25511 Pain in right shoulder: Secondary | ICD-10-CM

## 2019-06-22 DIAGNOSIS — H269 Unspecified cataract: Secondary | ICD-10-CM

## 2019-06-22 DIAGNOSIS — I10 Essential (primary) hypertension: Secondary | ICD-10-CM

## 2019-06-22 DIAGNOSIS — E785 Hyperlipidemia, unspecified: Secondary | ICD-10-CM

## 2019-06-22 DIAGNOSIS — B0229 Other postherpetic nervous system involvement: Secondary | ICD-10-CM

## 2019-06-22 DIAGNOSIS — E119 Type 2 diabetes mellitus without complications: Secondary | ICD-10-CM

## 2019-06-22 MED ORDER — TRIAMCINOLONE ACETONIDE 40 MG/ML IJ SUSP
20 mg | Freq: Once | INTRAMUSCULAR | 0 refills | Status: CP | PRN
Start: 2019-06-22 — End: ?

## 2019-06-22 MED ORDER — BUPIVACAINE (PF) 0.25 % (2.5 MG/ML) IJ SOLN
4 mL | Freq: Once | INTRAMUSCULAR | 0 refills | Status: CP | PRN
Start: 2019-06-22 — End: ?
  Administered 2019-06-23: 01:00:00 4 mL via INTRAMUSCULAR

## 2019-06-22 MED ORDER — BUPIVACAINE (PF) 0.25 % (2.5 MG/ML) IJ SOLN
2 mL | Freq: Once | INTRAMUSCULAR | 0 refills | Status: CP | PRN
Start: 2019-06-22 — End: ?
  Administered 2019-06-23: 01:00:00 2 mL via INTRAMUSCULAR

## 2019-06-22 MED ORDER — TRIAMCINOLONE ACETONIDE 40 MG/ML IJ SUSP
40 mg | Freq: Once | INTRAMUSCULAR | 0 refills | Status: CP | PRN
Start: 2019-06-22 — End: ?
  Administered 2019-06-23: 01:00:00 40 mg via INTRAMUSCULAR

## 2019-06-22 NOTE — Patient Instructions
General Instructions:   How to reach me: Please send a MyChart message to the [facility] or leave a voicemail for my nurse Rebecca at [phone].   Scheduling: Our scheduling phone number is [phone].   How to get a medication refill: Five business days before refill needed, please use the MyChart Refill request or contact your pharmacy directly to request medication refills.    How to receive your test results: If you have signed up for MyChart, you will receive your test results and messages from me this way. Otherwise, you will get a phone call or letter. If you are expecting results and have not heard from my office within 2 weeks of your testing, please send a MyChart message or call my office.   Support for many chronic illnesses is available through [facility]: [email or web address] or [phone].   For questions on nights, weekends or holidays, call the operator at [phone], and ask for the doctor on call for [facility].  

## 2019-06-23 NOTE — Procedures
Attending Surgeon: Evelina Bucy, MD    Anesthesia: Local    Pre-Procedure Diagnosis:   1. Pain of right scapula    2. Neuropathic pain    3. PHN (postherpetic neuralgia)        Post-Procedure Diagnosis:   1. Pain of right scapula    2. Neuropathic pain    3. PHN (postherpetic neuralgia)        [facility]  Nerve: suprascapular  Laterality: right      Consent:   Consent obtained: verbal  Consent given by: patient  Alternatives discussed: referral, no treatment, delayed treatment and alternative treatment  Discussed with patient the purpose of the treatment/procedure, other ways of treating my condition, including no treatment/ procedure and the risks and benefits of the alternatives. Patient has decided to proceed with treatment/procedure.        Universal Protocol:  Relevant documents: relevant documents present and verified  Site marked: the operative site was marked  Patient identity confirmed: Patient identify confirmed verbally with patient.        Time out: Immediately prior to procedure a time out was called to verify the correct patient, procedure, equipment, support staff and site/side marked as required        Procedures Details:   Indications: Neuritis and Pain Relief  Preparation: Patient was prepped and draped in the usual sterile fashion.  Prep: 2% chlorhexidine  Patient position: sitting  Needle size: 22 G  Guidance: ultrasound  Justification for use of ultrasound guidance: The use of direct sonographic visualization of the needle was required to ensure accurate injection placement for diagnostic specificity, to maximize clinical efficacy and for safety purposes to minimize risk of bleeding or injury to nearby neurovascular structures  Medications administered: 4 mL bupivacaine PF 0.25 %; 40 mg triamcinolone acetonide 40 mg/mL  Outcome: Pain improved  Patient tolerance: tolerated well, no immediate complications  Comments: Following appropriate ultrasound visualization of the needle tip at the suprascapular nerve and negative aspiration, the injectate above spread around the nerve under ultrasound visualization.     Nerve Block  Nerve: other peripheral nerve or branch (Please Comment) (subscapular)  Laterality: right      Consent:   Consent obtained: verbal  Consent given by: patient  Alternatives discussed: referral, no treatment, delayed treatment and alternative treatment  Discussed with patient the purpose of the treatment/procedure, other ways of treating my condition, including no treatment/ procedure and the risks and benefits of the alternatives. Patient has decided to proceed with treatment/procedure.        Universal Protocol:  Relevant documents: relevant documents present and verified  Site marked: the operative site was marked  Patient identity confirmed: Patient identify confirmed verbally with patient.        Time out: Immediately prior to procedure a time out was called to verify the correct patient, procedure, equipment, support staff and site/side marked as required        Procedures Details:   Indications: Neuritis and Pain Relief  Preparation: Patient was prepped and draped in the usual sterile fashion.  Prep: 2% chlorhexidine  Patient position: sitting  Needle size: 27 G  Guidance: ultrasound  Justification for use of ultrasound guidance: The use of direct sonographic visualization of the needle was required to ensure accurate injection placement for diagnostic specificity, to maximize clinical efficacy and for safety purposes to minimize risk of bleeding or injury to nearby neurovascular structures  Medications administered: 2 mL bupivacaine PF 0.25 %; 20 mg  triamcinolone acetonide 40 mg/mL  Outcome: Pain improved  Patient tolerance: tolerated well, no immediate complications  Comments:   Following negative aspiration and appropriate ultrasound visualization, the injectate above was injected. Estimated blood loss: none or minimal  Specimens: none  Patient tolerated the procedure well with no immediate complications. Pressure was applied, and hemostasis was accomplished.

## 2019-06-27 ENCOUNTER — Encounter: Admit: 2019-06-27 | Discharge: 2019-06-27 | Payer: MEDICARE

## 2019-06-30 MED ORDER — INSULIN LISPRO 100 UNIT/ML SC INPN
Freq: Three times a day (TID) | 1 refills | 30.00000 days | Status: AC
Start: 2019-06-30 — End: ?

## 2019-07-03 ENCOUNTER — Ambulatory Visit: Admit: 2019-07-03 | Discharge: 2019-07-04 | Payer: MEDICARE

## 2019-07-03 ENCOUNTER — Encounter: Admit: 2019-07-03 | Discharge: 2019-07-03 | Payer: MEDICARE

## 2019-07-03 DIAGNOSIS — M1990 Unspecified osteoarthritis, unspecified site: Secondary | ICD-10-CM

## 2019-07-03 DIAGNOSIS — W19XXXA Unspecified fall, initial encounter: Secondary | ICD-10-CM

## 2019-07-03 DIAGNOSIS — G629 Polyneuropathy, unspecified: Secondary | ICD-10-CM

## 2019-07-03 DIAGNOSIS — E119 Type 2 diabetes mellitus without complications: Secondary | ICD-10-CM

## 2019-07-03 DIAGNOSIS — I10 Essential (primary) hypertension: Secondary | ICD-10-CM

## 2019-07-03 DIAGNOSIS — E785 Hyperlipidemia, unspecified: Secondary | ICD-10-CM

## 2019-07-03 DIAGNOSIS — H269 Unspecified cataract: Secondary | ICD-10-CM

## 2019-07-03 MED ORDER — ~~LOC~~ RX EXTEMPORANEOUS MIXTURE
Freq: Two times a day (BID) | 3 refills | Status: CN
Start: 2019-07-03 — End: ?

## 2019-07-03 NOTE — Progress Notes
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 His main complaint is neuropathic pain which interferes with his sleep despite taking gabapentin, amitriptyline, capsaicin cream, duloxetine, and occasional oxycodone.     He also complains of pain around his knee joint and losing muscle around his thighs.       I had a detailed discussion with him about the management of neuropathic pain. His pain is not improving despite taking oxycodone so the options are limited. I will go ahead and request for a compounding cream so as to treat his neuropathic pain.       The patient was provided with our clinic numbers if there is any question or concern. A follow-up visit would be arranged as needed.       Charlies Silvers, MD  Department of Neurology  [facility]

## 2019-07-06 ENCOUNTER — Encounter: Admit: 2019-07-06 | Discharge: 2019-07-06 | Payer: MEDICARE

## 2019-07-07 ENCOUNTER — Encounter: Admit: 2019-07-07 | Discharge: 2019-07-07 | Payer: MEDICARE

## 2019-07-08 ENCOUNTER — Encounter: Admit: 2019-07-08 | Discharge: 2019-07-08 | Payer: MEDICARE

## 2019-07-17 ENCOUNTER — Encounter: Admit: 2019-07-17 | Discharge: 2019-07-17 | Payer: MEDICARE

## 2019-08-31 ENCOUNTER — Encounter: Admit: 2019-08-31 | Discharge: 2019-08-31 | Payer: MEDICARE

## 2019-08-31 DIAGNOSIS — E119 Type 2 diabetes mellitus without complications: Secondary | ICD-10-CM

## 2019-08-31 DIAGNOSIS — I10 Essential (primary) hypertension: Secondary | ICD-10-CM

## 2019-08-31 DIAGNOSIS — M1990 Unspecified osteoarthritis, unspecified site: Secondary | ICD-10-CM

## 2019-08-31 DIAGNOSIS — G629 Polyneuropathy, unspecified: Secondary | ICD-10-CM

## 2019-08-31 DIAGNOSIS — H269 Unspecified cataract: Secondary | ICD-10-CM

## 2019-08-31 DIAGNOSIS — W19XXXA Unspecified fall, initial encounter: Secondary | ICD-10-CM

## 2019-08-31 DIAGNOSIS — E785 Hyperlipidemia, unspecified: Secondary | ICD-10-CM

## 2020-02-11 ENCOUNTER — Encounter: Admit: 2020-02-11 | Discharge: 2020-02-11 | Payer: MEDICARE

## 2020-02-12 ENCOUNTER — Encounter: Admit: 2020-02-12 | Discharge: 2020-02-12 | Payer: MEDICARE

## 2020-02-12 DIAGNOSIS — M1990 Unspecified osteoarthritis, unspecified site: Secondary | ICD-10-CM

## 2020-02-12 DIAGNOSIS — I10 Essential (primary) hypertension: Secondary | ICD-10-CM

## 2020-02-12 DIAGNOSIS — W19XXXA Unspecified fall, initial encounter: Secondary | ICD-10-CM

## 2020-02-12 DIAGNOSIS — E119 Type 2 diabetes mellitus without complications: Secondary | ICD-10-CM

## 2020-02-12 DIAGNOSIS — M62838 Other muscle spasm: Secondary | ICD-10-CM

## 2020-02-12 DIAGNOSIS — G629 Polyneuropathy, unspecified: Secondary | ICD-10-CM

## 2020-02-12 DIAGNOSIS — H269 Unspecified cataract: Secondary | ICD-10-CM

## 2020-02-12 DIAGNOSIS — E785 Hyperlipidemia, unspecified: Secondary | ICD-10-CM

## 2020-02-12 MED ORDER — PREGABALIN 75 MG PO CAP
75 mg | ORAL_CAPSULE | Freq: Three times a day (TID) | ORAL | 2 refills | Status: AC
Start: 2020-02-12 — End: ?

## 2020-02-12 NOTE — Progress Notes
[facility] - Interventional Pain  Follow Up Visit  Subjective     Chief Complaint: Pain    HPI: Kevin Obrien is a 69 y.o. male who  has a past medical history of Accidental fall, Arthritis, Cataract, DM (diabetes mellitus) (HCC), Hyperlipidemia, Hypertension, and Neuropathy. who now presents for evaluation of pain.     He reports LBP today.     He states he has had chronic low back pain and muscle stiffness since 1985, but it is worse recently.     It is in a band across the low back.  There is BLE posterolateral distribution pain down to the feet.     +N/T.    Patient endorses achiness/heaviness of legs with walking a long distance that improves with rest. Positive Shopping Cart sign.     It is rated about 8/10.     The pain is described as throbbing.     +muscle stiffness/tightness.     He feels the other issues he was seen for about a year ago have improved.     He still does not think the gabapentin is helping much. He is taking only 300mg  bid. He has not tried a higher dose.         PRIOR MEDICATIONS:   Effective  Gabapentin  Cymbalta    Ineffective  Nortriptyline  Capsaicin oint  NSAID  Acetaminophen    Unable to tolerate      Never  Lyrica  Tizanidine      PRIOR INTERVENTIONS:  No spine surgery   Effective  Bilateral TAP block   Right intercostal nerve blocks  Right suprascapular nerve block    Ineffective  Exercise, pain limited        Kevin Obrien denies any recent fevers, chills, infection, antibiotics, bowel or bladder incontinence, saddle anesthesia, bleeding issues, or recent anticoagulant. ASA 81mg       Past Medical History:  Medical History:   Diagnosis Date   ? Accidental fall    ? Arthritis    ? Cataract    ? DM (diabetes mellitus) (HCC)    ? Hyperlipidemia    ? Hypertension    ? Neuropathy        Family History:  Family History   Problem Relation Age of Onset   ? Diabetes Mother    ? Diabetes Father    ? Cancer-Lung Father    ? Melanoma Neg Hx    ? Cataract Neg Hx    ? Glaucoma Neg Hx    ? Macular Degen Neg Hx        Social History:  Lives in [place], [place].   Social History     Socioeconomic History   ? Marital status: Separated     Spouse name: Not on file   ? Number of children: 3   ? Years of education: Not on file   ? Highest education level: Not on file   Occupational History   ? Not on file   Tobacco Use   ? Smoking status: Former Smoker     Quit date: 04/12/1971     Years since quitting: 48.8   ? Smokeless tobacco: Never Used   Vaping Use   ? Vaping Use: Never used   Substance and Sexual Activity   ? Alcohol use: Not Currently   ? Drug use: Never   ? Sexual activity: Not on file   Other Topics Concern   ? Not on file  Social History Narrative   ? Not on file       Allergies:  No Known Allergies    Medications:    Current Outpatient Medications:   ?  amitriptyline (ELAVIL) 25 mg tablet, Take one tablet by mouth at bedtime daily., Disp: 30 tablet, Rfl: 3  ?  amLODIPine (NORVASC) 10 mg tablet, Take 1 tablet by mouth daily. (Patient taking differently: Take 5 mg by mouth daily.), Disp: 90 tablet, Rfl: 3  ?  aspirin 81 mg chewable tablet, Take 81 mg by mouth daily.  , Disp: , Rfl:   ?  atorvastatin (LIPITOR) 40 mg tablet, Take 40 mg by mouth daily., Disp: , Rfl:   ?  capsaicin 0.025 % topical cream, Apply on the affected area for neuropathic pain. Wash hands after application, Disp: 21 g, Rfl: 3  ?  cephalexin (KEFLEX) 500 mg capsule, Take 500 mg by mouth four times daily., Disp: , Rfl:   ?  duloxetine DR (CYMBALTA) 30 mg capsule, Take one capsule by mouth twice daily. Indications: neuropathic pain, Disp: 60 capsule, Rfl: 3  ?  flash glucose scanning reader (FREESTYLE LIBRE 2 READER) misc, Use as directed., Disp: 1 each, Rfl: 0  ?  flash glucose sensor (FREESTYLE LIBRE 2 SENSOR) kit, Use as directed., Disp: 2 kit, Rfl: 3  ?  gabapentin (NEURONTIN) 300 mg capsule, Take one capsule by mouth three times daily. (Patient taking differently: Take 400 mg by mouth three times daily.), Disp: 90 capsule, Rfl: 5  ?  glucose 4 gram chewable tablet, Chew 4 tablets by mouth as Needed., Disp: 50 tablet, Rfl: 3  ?  hydrOXYzine (ATARAX) 25 mg tablet, Take 25 mg by mouth three times daily as needed for Itching., Disp: , Rfl:   ?  ibuprofen (ADVIL) 200 mg tablet, Take 200 mg by mouth every 6 hours as needed for Pain. Take with food., Disp: , Rfl:   ?  insulin lispro (HUMALOG KWIKPEN) 100 unit/mL injection PEN, Inject 18 u three times daily with meals, Disp: 30 mL, Rfl: 1  ?  insulin pen needles (disposable) (B-D NANO; ULTICARE MICRO) 32 gauge x 5/32 pen needle, Use one each as directed five times daily. Use with insulin injections., Disp: 500 each, Rfl: 3  ?  Ketamine HCl 5%/Baclofen 2%/Clonidine HCl 0.2%/Gabapentin 5%/Amitriptyline HCl 2% in LipoPen Ulra Cream (COMPOUND), Apply topically to the affected area(s) as directed 1-2 times daily as needed for neuropathic pain., Disp: 100 g, Rfl: 3  ?  LANTUS SOLOSTAR U-100 INSULIN 100 unit/mL (3 mL) injection PEN, Inject forty Units under the skin twice daily., Disp: 75 mL, Rfl: 3  ?  lisinopril (PRINIVIL, ZESTRIL) 40 mg tablet, Take 40 mg by mouth daily., Disp: , Rfl:   ?  MYRBETRIQ 50 mg tablet, Take one tablet by mouth daily., Disp: 90 tablet, Rfl: 0  ?  oxyCODONE/acetaminophen (PERCOCET) 5/325 mg tablet, Take one tablet by mouth at bedtime as needed for Pain, Disp: 15 tablet, Rfl: 0  ?  semaglutide (OZEMPIC) 1 mg/dose (2 mg/1.5 mL) injection PEN, Inject one mg under the skin every 7 days., Disp: 3 mL, Rfl: 3  ?  sildenafiL (VIAGRA) 100 mg tablet, Take one tablet by mouth as Needed for Erectile dysfunction. Take 1-4 hrs prior to sexual activity.  Take on empty stomach.  Indications: the inability to have an erection, Disp: 30 tablet, Rfl: 11  ?  tamsulosin (FLOMAX) 0.4 mg capsule, Take one capsule by mouth daily. Take 30 min after same  meal.  Do not cut/ crush/ chew., Disp: 90 capsule, Rfl: 3    Physical examination:   BP (!) 150/74  - Pulse 89  - Ht 167.6 cm (66)  - Wt 57.6 kg (127 lb)  - SpO2 100%  - BMI 20.50 kg/m?   Pain Score: Eight    General: The patient is a well-developed, well nourished 69 y.o. male in no acute distress.   HEENT: Head is normocephalic and atraumatic. EOMI bilaterally.   Cardiac: Based on palpation, pulse appears to be regular rate and rhythm.    Pulmonary: The patient has unlabored respirations and bilateral symmetric chest excursion.   Abdomen: Soft, nontender, and nondistended. There is no rebound or guarding.   Extremities: No clubbing, cyanosis, or edema.     Neurologic:   The patient is alert and oriented times 3.     Musculoskeletal:   Gait is mildly antalgic.     L-Spine   There is mild low lumbar paraspinal tenderness. Paraspinal muscle tone is increased.  There is no tenderness or radiating pain with palpation over the SI joints, piriformis, or greater trochanteric bursae bilaterally.  ROM with flexion, extension, rotation, and lateral bending is intact.  Strength is equal and adequate bilaterally in the flexors and extensors of the bilateral lower extremities.   SLR is positive bilaterally.      MRI L-Spine Results:    Results for orders placed during the hospital encounter of 11/24/16   MRI L-SPINE WO CONTRAST    Narrative MRI L-SPINE WO CONTRAST    Clinical indication: Lumbosacral radiculopathy.    Technique: MRI of the lumbar spine is performed without contrast.    Contrast: None    Comparison: CTA of the abdomen and pelvis from March 01, 2016    Findings:    There is straightening of lumbar lordosis. The vertebral body heights are well-maintained without a suspicious geographic marrow lesion. There is disc degeneration and discogenic endplate changes from L3 through S1. The conus terminates normally. The nerve roots of the cauda equina are normal in appearance. The prevertebral and paraspinal soft tissues are unremarkable.    L1-L2: Normal.    L2-L3: No spinal canal or neural foraminal narrowing.    L3-L4: Symmetrically bulging disc and prominent posterior epidural fat in mild spinal canal and bilateral neural foraminal narrowing.    L4-L5: Symmetrically bulging disc with prominent posterior epidural fat, facet arthropathy, and ligamentum flavum laxity results in moderate spinal canal and right neural foraminal narrowing. There is mild left neural foraminal compromise.    L5-S1: Symmetrically bulging disc and facet arthropathy results in mild right subarticular zone and moderate bilateral neural foraminal narrowing.      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       Finalized by Wandra Mannan, M.D. on 11/26/2016 8:02 AM. Dictated by Wandra Mannan, M.D. on 11/26/2016 7:57 AM.         Last Cr and LFT's:  Creatinine   Date Value Ref Range Status   02/22/2019 0.94 0.4 - 1.24 MG/DL Final     AST (SGOT)   Date Value Ref Range Status   02/22/2019 16 7 - 40 U/L Final     ALT (SGPT)   Date Value Ref Range Status   02/22/2019 23 7 - 56 U/L Final  Alk Phosphatase   Date Value Ref Range Status   02/22/2019 47 25 - 110 U/L Final     Total Bilirubin   Date Value Ref Range Status   02/22/2019 0.6 0.3 - 1.2 MG/DL Final          Assessment:    Kevin Obrien is a 69 y.o. male who  has a past medical history of Accidental fall, Arthritis, Cataract, DM (diabetes mellitus) (HCC), Hyperlipidemia, Hypertension, and Neuropathy. who presents for evaluation of multifocal pain.     The pain complaints are most likely due to:    1. 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 Myalgia, other site     4. DDD (degenerative disc disease), lumbosacral     5. Myofascial pain     6. Muscle spasm     7. Facet arthropathy         Patient has had an adequate trial of > 3 months of rest, exercise, NSAID's, multimodal treatment, and the passage of time without improvement of symptoms. The pain has significant impact on the daily quality of life.     Plan:    1. Plan for Bilateral L5-S1 TFESI and TPI low back in PROCEDURES.   2. Will d/c gabapentin and trial Lyrica 75mg  up to tid titration.   3. Continue Cymbalta. No AE at this time.   4. Follow up as needed.     Risks/benefits of all pharmacologic and interventional treatments discussed and questions answered.

## 2020-02-13 ENCOUNTER — Ambulatory Visit: Admit: 2020-02-12 | Discharge: 2020-02-13 | Payer: MEDICARE

## 2020-02-13 DIAGNOSIS — M7918 Myalgia, other site: Secondary | ICD-10-CM

## 2020-02-13 DIAGNOSIS — M5137 Other intervertebral disc degeneration, lumbosacral region: Secondary | ICD-10-CM

## 2020-02-13 DIAGNOSIS — M48062 Spinal stenosis, lumbar region with neurogenic claudication: Secondary | ICD-10-CM

## 2020-02-13 DIAGNOSIS — M47819 Spondylosis without myelopathy or radiculopathy, site unspecified: Secondary | ICD-10-CM

## 2020-02-13 DIAGNOSIS — M5417 Radiculopathy, lumbosacral region: Secondary | ICD-10-CM

## 2020-02-29 ENCOUNTER — Encounter: Admit: 2020-02-29 | Discharge: 2020-02-29 | Payer: MEDICARE

## 2020-02-29 ENCOUNTER — Emergency Department: Admit: 2020-02-29 | Discharge: 2020-02-29 | Disposition: A | Discharge status: Home / Self Care | Payer: MEDICARE

## 2020-02-29 MED ORDER — HYDROCODONE-ACETAMINOPHEN 5-325 MG PO TAB
1 | ORAL_TABLET | ORAL | 0 refills | 30.00000 days | Status: AC | PRN
Start: 2020-02-29 — End: ?

## 2020-02-29 MED ORDER — VALACYCLOVIR 1 GRAM PO TAB
1000 mg | ORAL_TABLET | ORAL | 0 refills | Status: AC
Start: 2020-02-29 — End: ?

## 2020-02-29 NOTE — ED Notes
Pt A&O x4, VSS, with no complaints. Pt given discharge instructions. Pt verbalizes understanding of discharge instructions. Pt states no further questions or comments at this point in time.

## 2020-02-29 NOTE — ED Notes
69 y/o male presents to ED52 c/o rash. Pt states he developed a rash on his left chest and left shoulder 3 days ago. Pt rash appears to be red and raised with fluid filled vesicles. Pt endorses sharp pain when rash is touched and states he feels better without a shirt on. Pt denies fevers/chills. Pt denies N/v. Pt denies CP/SOA. Pt denies any other pain or complaints at this time.

## 2020-03-18 ENCOUNTER — Encounter: Admit: 2020-03-18 | Discharge: 2020-03-18 | Payer: MEDICARE

## 2020-03-21 ENCOUNTER — Encounter: Admit: 2020-03-21 | Discharge: 2020-03-21 | Payer: MEDICARE

## 2020-03-21 ENCOUNTER — Ambulatory Visit: Admit: 2020-03-21 | Discharge: 2020-03-21 | Payer: MEDICARE

## 2020-03-21 DIAGNOSIS — M7918 Myalgia, other site: Secondary | ICD-10-CM

## 2020-03-21 DIAGNOSIS — M5417 Radiculopathy, lumbosacral region: Secondary | ICD-10-CM

## 2020-03-21 MED ORDER — IOHEXOL 240 MG IODINE/ML IV SOLN
2.5 mL | Freq: Once | EPIDURAL | 0 refills | Status: CP
Start: 2020-03-21 — End: ?
  Administered 2020-03-21: 20:00:00 2.5 mL via EPIDURAL

## 2020-03-21 MED ORDER — DEXAMETHASONE SODIUM PHOS (PF) 10 MG/ML IJ SOLN
15 mg | Freq: Once | 0 refills | Status: CP
Start: 2020-03-21 — End: ?
  Administered 2020-03-21: 20:00:00 15 mg

## 2020-03-21 NOTE — Progress Notes
03/21/20 1507   Vitals   BP (!) 164/84       Patient reports he has not taken his blood pressure medications today. RN advised patient to go home and take BP medications, and recheck BP at home. Patient expressed understanding.

## 2020-03-21 NOTE — Progress Notes
[facility]  INTERVENTIONAL PAIN PROCEDURE HISTORY AND PHYSICAL    No chief complaint on file.      HISTORY OF PRESENT ILLNESS:  Patient returns today for interventional treatment of pain. Patient denies any recent fevers, chills, infection, antibiotics, coagulopathy or contra-indicated anticoagulants. Risks of the procedure were discussed including but not limited to bleeding, infection, damage to surrounding structures and reaction to medications. Patient reports understanding and has elected to proceed with the procedure.       Medical History:   Diagnosis Date   ? Accidental fall    ? Arthritis    ? Cataract    ? DM (diabetes mellitus) (HCC)    ? Hyperlipidemia    ? Hypertension    ? Neuropathy        Surgical History:   Procedure Laterality Date   ? CORONARY STENT PLACEMENT  2016   ? HX CHOLECYSTECTOMY  09/2015   ? COLONOSCOPY N/A 04/20/2016    Performed by Vertell Novak, MD at [facility] ENDO   ? ESOPHAGOGASTRODUODENOSCOPY N/A 04/20/2016    Performed by Vertell Novak, MD at [facility]   ? ESOPHAGOGASTRODUODENOSCOPY BIOPSY  04/20/2016    Performed by Vertell Novak, MD at [facility] ENDO   ? UREA BREATH TEST N/A 11/13/2016    Performed by Jolee Ewing, MD at [facility] END[facility]       family history includes Cancer-Lung in his father; Diabetes in his father and mother.    Social History     Socioeconomic History   ? Marital status: Separated     Spouse name: Not on file   ? Number of children: 3   ? Years of education: Not on file   ? Highest education level: Not on file   Occupational History   ? Not on file   Tobacco Use   ? Smoking status: Former Smoker     Quit date: 04/12/1971     Years since quitting: 48.9   ? Smokeless tobacco: Never Used   Vaping Use   ? Vaping Use: Never used   Substance and Sexual Activity   ? Alcohol use: Not Currently   ? Drug use: Never   ? Sexual activity: Not on file   Other Topics Concern   ? Not on file   Social History Narrative   ? Not on file       No Known Allergies    Vitals:    03/21/20 1404   BP: (!) 143/84   BP Source: Arm, Right Lower   Patient Position: Sitting   Pulse: 95   Resp: 14   Temp: 36.3 ?C (97.4 ?F)   SpO2: 100%   Weight: 57.6 kg (127 lb)   Height: 165.1 cm (65)   PainSc: Seven       REVIEW OF SYSTEMS: 10 point ROS obtained and negative except as above and below      PHYSICAL EXAM:  General: Alert, cooperative, no acute distress.  HEENT: Normocephalic, atraumatic.  Neck: Supple.  Lungs: Unlabored respirations, bilateral and equal chest excursion.  Heart: Regular rate.  Skin: Warm and dry to touch.  Abdomen: Nondistended.  MSK: No deformity.  Neurological: Alert and oriented x3.         IMPRESSION:    1. Myalgia, other site    2. Lumbosacral radiculopathy         PLAN: Lumbar Transforaminal Steroid Injection L5-S1 TFESI Bilaterally and TPI    Lesli Albee, MD  PGY-5 Interventional Pain Medicine Fellow  Chronic Pain Consult Pager [phone]

## 2020-03-21 NOTE — Progress Notes

## 2020-03-21 NOTE — Patient Instructions
Procedure Completed Today: Lumbar Transforaminal Steroid Injection L5-S1 AND TRIGGER POINT INJECTIONS    Important information following your procedure today: You may drive today    1. Pain relief may not be immediate. It is possible you may even experience an increase in pain during the first 24-48 hours followed by a gradual decrease of your pain.  2. Though the procedure is generally safe and complications are rare, we do ask that you be aware of any of the following:   ? Any swelling, persistent redness, new bleeding, or drainage from the site of the injection.  ? You should not experience a severe headache.  ? You should not run a fever over 101? F.  ? New onset of sharp, severe back & or neck pain.  ? New onset of upper or lower extremity numbness or weakness.  ? New difficulty controlling bowel or bladder function after the injection.  ? New shortness of breath.    If any of these occur, please call to report this occurrence to a nurse at [phone]. If you are calling after 4:00 p.m., on weekends or holidays please call [phone] and ask to have the resident physician on call for the physician paged or go to your local emergency room.  3. You may experience soreness at the injection site. Ice can be applied at 20 minute intervals. Avoid application of direct heat, hot showers or hot tubs today.  4. Avoid strenuous activity today. You may resume your regular activities and exercise tomorrow.  5. Patients with diabetes may see an elevation in blood sugars for 7-10 days after the injection. It is important to pay close attention to your diet, check your blood sugars daily and report extreme elevations to the physician that treats your diabetes.  6. Patients taking a daily blood thinner can resume their regular dose this evening.  7. It is important that you take all medications ordered by your pain physician. Taking medication as ordered is an important part of your pain care plan. If you cannot continue the medication plan, please notify the physician.     Possible side effects to steroids that may occur:  ? Flushing or redness of the face  ? Irritability  ? Fluid retention  ? Change in women?s menses    The following medications were used: Lidocaine , Decadron and Contrast Dye

## 2020-03-22 ENCOUNTER — Encounter: Admit: 2020-03-22 | Discharge: 2020-03-22 | Payer: MEDICARE

## 2020-03-22 DIAGNOSIS — G629 Polyneuropathy, unspecified: Secondary | ICD-10-CM

## 2020-03-22 NOTE — Progress Notes
Informed pt that Kevin Obrien has left [place] and we would like to place a referral for him to establish with a new neurologist. He is agreeable to this. Answered all questions.

## 2020-03-22 NOTE — Procedures
Attending Surgeon: Evelina Bucy, MD    Anesthesia: Local    Pre-Procedure Diagnosis:   1. Lumbosacral radiculopathy    2. Myalgia, other site        Post-Procedure Diagnosis:   1. Lumbosacral radiculopathy    2. Myalgia, other site        [place] AMB SPINE INJECT SNRB/TFESI LUMBAR/SACRAL  Procedure: transforaminal epidural    Laterality: bilateral   on 03/21/2020 2:00 PM  Location: Lumbar/Sacral -  L5-S1      Consent:   Consent obtained: written  Consent given by: patient  Risks discussed: allergic reaction, bleeding, bruising, infection, nerve damage, no change or worsening in pain, reaction to medication, seizure, swelling and weakness  Alternatives discussed: alternative treatment, delayed treatment and no treatment  Discussed with patient the purpose of the treatment/procedure, other ways of treating my condition, including no treatment/ procedure and the risks and benefits of the alternatives. Patient has decided to proceed with treatment/procedure.        Universal Protocol:  Relevant documents: relevant documents present and verified  Site marked: the operative site was marked  Patient identity confirmed: Patient identify confirmed verbally with patient.        Time out: Immediately prior to procedure a time out was called to verify the correct patient, procedure, equipment, support staff and site/side marked as required      Procedures Details:   Indications: pain   Prep: chlorhexidine  Patient position: prone  Estimated Blood Loss: minimal  Specimens: none  Number of Joints: 1  Guidance: fluoroscopy  Contrast: Procedure confirmed with contrast under live fluoroscopy.  Needle and Epidural Catheter: quincke  Needle size: 25 G  Injection procedure: Incremental injection and Negative aspiration for blood  Patient tolerance: Patient tolerated the procedure well with no immediate complications. Pressure was applied, and hemostasis was accomplished.  Outcome: Pain relieved  Comments:   LUMBAR/SACRAL TRANSFORAMINAL EPIDURAL STEROID INJECTION PROCEDURE:    1) bilateral L5-S1 transforaminal epidural steroid injection  2) Fluoroscopic needle guidance    REASON FOR PROCEDURE: Lumbar radiculopathy    PHYSICIAN: Evelina Bucy, MD    MEDICATIONS INJECTED: 0.75 mL of Dexamethasone (7.5 mg) + 0.75 ml lidocaine 1% at each level    LOCAL ANESTHETIC INJECTED: 1 mL of 1% lidocaine per site    SEDATION MEDICATIONS: None    ESTIMATED BLOOD LOSS: None    SPECIMENS REMOVED: None    COMPLICATIONS: None    TECHNIQUE: Time-out was taken to identify the correct patient, procedure and side prior to starting the procedure. Lying in a prone position, the patient was prepped and draped in the usual sterile fashion using DuraPrep and a fenestrated drape. The area to be injected was determined under fluoroscopic guidance. Local anesthetic was given by raising a skin wheal and going down to the hub of a 27-gauge 1.25-inch needle.     The 3.5-inch 25-gauge Quincke needle was advanced toward the 6 o?clock position of the pedicle at each above-named nerve root level. The needle was advanced to the final position via a lateral fluoroscopic intermittent image. Omnipaque 240 was injected under live fluoroscopy and showed epidural spread and there was no vascular runoff. After a negative aspiration, the medication was then injected.     The procedure was completed without complications and was tolerated well. The patient was monitored after the procedure. The patient (or responsible party) was given post-procedure and discharge instructions to follow at home. The patient was discharged in stable condition.  Trigger Point Injection  Locations: L lumbar, R lumbar, R gluteus maximus and L gluteus maximus  Consent:   Consent obtained: written  Consent given by: patient  Alternatives discussed: alternative treatment, delayed treatment and no treatment  Discussed with patient the purpose of the treatment/procedure, other ways of treating my condition, including no treatment/ procedure and the risks and benefits of the alternatives. Patient has decided to proceed with treatment/procedure.        Universal Protocol:  Relevant documents: relevant documents present and verified  Site marked: the operative site was marked  Patient identity confirmed: Patient identify confirmed verbally with patient.        Time out: Immediately prior to procedure a time out was called to verify the correct patient, procedure, equipment, support staff and site/side marked as required      Procedures Details:   Indications: muscle spasm and myalgiaPrep: 2% chlorhexidine  Needle size: 27 G  Number of muscles: 3 or more  Approach: posterior  Patient tolerance: Patient tolerated the procedure well with no immediate complications. Pressure was applied, and hemostasis was accomplished.  Comments:   PROCEDURE: Trigger Point Injections    Pre-Procedure Diagnoses:  1. Myalgia, other  2. Myofascial pain  3. Muscle spasm    Post-Procedure Diagnoses:  Same    Physician: Evelina Bucy, MD    ANESTHESIA: Lidocaine 1%    COMPLICATIONS: None.    Location(s) of pain: As noted above  Presence of point tenderness in a tight band of muscle in above area: Yes  Restricted range of motion: Yes  Conservative therapy attempted for at least 6 weeks: activity modification, pharmacotherapy  Other components of comprehensive management: pharmacotherapy, activity modification    DESCRIPTION OF PROCEDURE: The procedure risks, hazards and alternatives were discussed with the patient and a proper consent was obtained. The area over each trigger point was prepped with alcohol utilizing sterile technique. After isolating it between two palpating fingertips a 27-gauge 1.5 needle was placed in the center of the myofascial spasms and a negative aspiration was performed. Then 0.5cc of the solution above was injected into each trigger point.     Trigger points in each of the muscle groups noted above were injected. A total of 5 ml of the local anesthetic solution was utilized.    The patient tolerated the procedure well without any apparent difficulties or complications.          Estimated blood loss: none or minimal  Specimens: none  Patient tolerated the procedure well with no immediate complications. Pressure was applied, and hemostasis was accomplished.

## 2020-04-25 ENCOUNTER — Encounter: Admit: 2020-04-25 | Discharge: 2020-04-25 | Payer: MEDICARE

## 2020-04-25 ENCOUNTER — Ambulatory Visit: Admit: 2020-04-25 | Discharge: 2020-04-25 | Payer: MEDICARE

## 2020-04-25 DIAGNOSIS — H269 Unspecified cataract: Secondary | ICD-10-CM

## 2020-04-25 DIAGNOSIS — M5137 Other intervertebral disc degeneration, lumbosacral region: Secondary | ICD-10-CM

## 2020-04-25 DIAGNOSIS — I10 Essential (primary) hypertension: Secondary | ICD-10-CM

## 2020-04-25 DIAGNOSIS — G629 Polyneuropathy, unspecified: Secondary | ICD-10-CM

## 2020-04-25 DIAGNOSIS — E119 Type 2 diabetes mellitus without complications: Secondary | ICD-10-CM

## 2020-04-25 DIAGNOSIS — E785 Hyperlipidemia, unspecified: Secondary | ICD-10-CM

## 2020-04-25 DIAGNOSIS — M1990 Unspecified osteoarthritis, unspecified site: Secondary | ICD-10-CM

## 2020-04-25 DIAGNOSIS — M5417 Radiculopathy, lumbosacral region: Secondary | ICD-10-CM

## 2020-04-25 DIAGNOSIS — M7918 Myalgia, other site: Secondary | ICD-10-CM

## 2020-04-25 DIAGNOSIS — W19XXXA Unspecified fall, initial encounter: Secondary | ICD-10-CM

## 2020-04-25 NOTE — Progress Notes
[facility] - Interventional Pain      Subjective     Chief Complaint: LBP    Interval HPI: Kevin Obrien is a 69 y.o. male who  has a past medical history of Accidental fall, Arthritis, Cataract, DM (diabetes mellitus) (HCC), Hyperlipidemia, Hypertension, and Neuropathy. who now presents for evaluation.      Patient reports about 50% relief after bilateral L5-S1 TFESI performed on 03/21/2020. Patient states his pain is less severe overall.     The pain is across the low back, then will radiate in posterior distribution to the bottom of the feet bilaterally  Pain is constant  Numbness/tingling: yes - feet   The pain ranges 5-7/10  The pain is described as shock-like, ache  The pain is exacerbated by walking, standing, bending  The pain is completely alleviated by rest/sitting  +muscle stiffness/tightness  Reports weakness/heaviness in BLE    Patient currently taking Lyrica TID as prescribed, denies any SEs, patient feels providing mild benefit.     Hx of spine surgery: none    Patient denies fevers, chills, infection, bleeding issues, anticoagulants, new muscle weakness, numbness, tingling, bowel/bladder incontinence, or saddle anesthesia. +urine retention       ROS:   Review of Systems   Musculoskeletal: Positive for back pain and myalgias.     PRIOR MEDICATIONS:   Effective  Gabapentin  Cymbalta    Ineffective  Nortriptyline  Capsaicin oint  NSAID  Acetaminophen    Unable to tolerate      Never  Lyrica  Tizanidine      PRIOR INTERVENTIONS:  No spine surgery   Effective  Bilateral TAP block   Right intercostal nerve blocks  Right suprascapular nerve block    Ineffective  Exercise, pain limited        Giovanne A Critchfield denies any recent fevers, chills, infection, antibiotics, bowel or bladder incontinence, saddle anesthesia, bleeding issues, or recent anticoagulant. ASA 81mg       Past Medical History:  Medical History:   Diagnosis Date   ? Accidental fall    ? Arthritis    ? Cataract    ? DM (diabetes mellitus) (HCC)    ? Hyperlipidemia    ? Hypertension    ? Neuropathy        Family History:  Family History   Problem Relation Age of Onset   ? Diabetes Mother    ? Diabetes Father    ? Cancer-Lung Father    ? Melanoma Neg Hx    ? Cataract Neg Hx    ? Glaucoma Neg Hx    ? Macular Degen Neg Hx        Social History:  Lives in [place], [place].   Social History     Socioeconomic History   ? Marital status: Separated     Spouse name: Not on file   ? Number of children: 3   ? Years of education: Not on file   ? Highest education level: Not on file   Occupational History   ? Not on file   Tobacco Use   ? Smoking status: Former Smoker     Quit date: 04/12/1971     Years since quitting: 49.0   ? Smokeless tobacco: Never Used   Vaping Use   ? Vaping Use: Never used   Substance and Sexual Activity   ? Alcohol use: Not Currently   ? Drug use: Never   ? Sexual activity: Not on file  Other Topics Concern   ? Not on file   Social History Narrative   ? Not on file       Allergies:  No Known Allergies    Medications:    Current Outpatient Medications:   ?  amitriptyline (ELAVIL) 25 mg tablet, Take one tablet by mouth at bedtime daily., Disp: 30 tablet, Rfl: 3  ?  amLODIPine (NORVASC) 10 mg tablet, Take 1 tablet by mouth daily. (Patient taking differently: Take 5 mg by mouth daily.), Disp: 90 tablet, Rfl: 3  ?  aspirin 81 mg chewable tablet, Take 81 mg by mouth daily.  , Disp: , Rfl:   ?  atorvastatin (LIPITOR) 40 mg tablet, Take 40 mg by mouth daily., Disp: , Rfl:   ?  capsaicin 0.025 % topical cream, Apply on the affected area for neuropathic pain. Wash hands after application, Disp: 21 g, Rfl: 3  ?  duloxetine DR (CYMBALTA) 30 mg capsule, Take one capsule by mouth twice daily. Indications: neuropathic pain, Disp: 60 capsule, Rfl: 3  ?  flash glucose scanning reader (FREESTYLE LIBRE 2 READER) misc, Use as directed., Disp: 1 each, Rfl: 0  ?  flash glucose sensor (FREESTYLE LIBRE 2 SENSOR) kit, Use as directed., Disp: 2 kit, Rfl: 3  ?  gabapentin (NEURONTIN) 300 mg capsule, Take one capsule by mouth three times daily. (Patient taking differently: Take 400 mg by mouth three times daily.), Disp: 90 capsule, Rfl: 5  ?  glucose 4 gram chewable tablet, Chew 4 tablets by mouth as Needed., Disp: 50 tablet, Rfl: 3  ?  HYDROcodone/acetaminophen (NORCO) 5/325 mg tablet, Take one tablet by mouth every 6 hours as needed, Disp: 10 tablet, Rfl: 0  ?  hydrOXYzine (ATARAX) 25 mg tablet, Take 25 mg by mouth three times daily as needed for Itching., Disp: , Rfl:   ?  ibuprofen (ADVIL) 200 mg tablet, Take 200 mg by mouth every 6 hours as needed for Pain. Take with food., Disp: , Rfl:   ?  insulin lispro (HUMALOG KWIKPEN) 100 unit/mL injection PEN, Inject 18 u three times daily with meals, Disp: 30 mL, Rfl: 1  ?  insulin pen needles (disposable) (B-D NANO; ULTICARE MICRO) 32 gauge x 5/32 pen needle, Use one each as directed five times daily. Use with insulin injections., Disp: 500 each, Rfl: 3  ?  Ketamine HCl 5%/Baclofen 2%/Clonidine HCl 0.2%/Gabapentin 5%/Amitriptyline HCl 2% in LipoPen Ulra Cream (COMPOUND), Apply topically to the affected area(s) as directed 1-2 times daily as needed for neuropathic pain., Disp: 100 g, Rfl: 3  ?  LANTUS SOLOSTAR U-100 INSULIN 100 unit/mL (3 mL) injection PEN, Inject forty Units under the skin twice daily., Disp: 75 mL, Rfl: 3  ?  lisinopril (PRINIVIL, ZESTRIL) 40 mg tablet, Take 40 mg by mouth daily., Disp: , Rfl:   ?  MYRBETRIQ 50 mg tablet, Take one tablet by mouth daily., Disp: 90 tablet, Rfl: 0  ?  oxyCODONE/acetaminophen (PERCOCET) 5/325 mg tablet, Take one tablet by mouth at bedtime as needed for Pain, Disp: 15 tablet, Rfl: 0  ?  pregabalin (LYRICA) 75 mg capsule, Take one capsule by mouth three times daily. 1 cap daily x5d, then bid x5d, then tid, Disp: 90 capsule, Rfl: 2  ?  semaglutide (OZEMPIC) 1 mg/dose (2 mg/1.5 mL) injection PEN, Inject one mg under the skin every 7 days., Disp: 3 mL, Rfl: 3  ?  sildenafiL (VIAGRA) 100 mg tablet, Take one tablet by mouth as Needed for Erectile dysfunction.  Take 1-4 hrs prior to sexual activity.  Take on empty stomach.  Indications: the inability to have an erection, Disp: 30 tablet, Rfl: 11  ?  tamsulosin (FLOMAX) 0.4 mg capsule, Take one capsule by mouth daily. Take 30 min after same meal.  Do not cut/ crush/ chew., Disp: 90 capsule, Rfl: 3    Physical examination:   BP (!) 140/66 (BP Source: Arm, Left Upper, Patient Position: Sitting)  - Pulse 92  - Ht 165.1 cm (65)  - Wt 57.6 kg (127 lb)  - SpO2 100%  - BMI 21.13 kg/m?   Pain Score: Seven    General: The patient is a well-developed, well nourished 69 y.o. male in no acute distress.   HEENT: Head is normocephalic and atraumatic. EOMI bilaterally.   Cardiac: Based on palpation, pulse appears to be regular rate and rhythm.    Pulmonary: The patient has unlabored respirations and bilateral symmetric chest excursion.   Abdomen: Soft, nontender, and nondistended. There is no rebound or guarding.   Extremities: No clubbing, cyanosis, or edema.     Neurologic:   The patient is alert and oriented times 3.     Musculoskeletal:   Gait is mildly antalgic.     L-Spine   There is mild low lumbar paraspinal tenderness. Paraspinal muscle tone is increased.  There is no tenderness or radiating pain with palpation over the SI joints, piriformis, or greater trochanteric bursae bilaterally.  FABER negative bilaterally, mild pain noted with left hip internal rotation.  ROM with flexion, extension, rotation, and lateral bending is intact.  Strength is equal and adequate bilaterally in the flexors and extensors of the bilateral lower extremities.   SLR is mildly positive bilaterally.      MRI L-Spine Results:    Results for orders placed during the hospital encounter of 11/24/16   MRI L-SPINE WO CONTRAST    Narrative MRI L-SPINE WO CONTRAST    Clinical indication: Lumbosacral radiculopathy.    Technique: MRI of the lumbar spine is performed without contrast.    Contrast: None    Comparison: CTA of the abdomen and pelvis from March 01, 2016    Findings:    There is straightening of lumbar lordosis. The vertebral body heights are well-maintained without a suspicious geographic marrow lesion. There is disc degeneration and discogenic endplate changes from L3 through S1. The conus terminates normally. The nerve roots of the cauda equina are normal in appearance. The prevertebral and paraspinal soft tissues are unremarkable.    L1-L2: Normal.    L2-L3: No spinal canal or neural foraminal narrowing.    L3-L4: Symmetrically bulging disc and prominent posterior epidural fat in mild spinal canal and bilateral neural foraminal narrowing.    L4-L5: Symmetrically bulging disc with prominent posterior epidural fat, facet arthropathy, and ligamentum flavum laxity results in moderate spinal canal and right neural foraminal narrowing. There is mild left neural foraminal compromise.    L5-S1: Symmetrically bulging disc and facet arthropathy results in mild right subarticular zone and moderate bilateral neural foraminal narrowing.      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       Finalized by Wandra Mannan, M.D. on 11/26/2016 8:02 AM. Dictated by Wandra Mannan, M.D. on 11/26/2016 7:57 AM.         Last  Cr and LFT's:  Creatinine   Date Value Ref Range Status   02/22/2019 0.94 0.4 - 1.24 MG/DL Final     AST (SGOT)   Date Value Ref Range Status   02/22/2019 16 7 - 40 U/L Final     ALT (SGPT)   Date Value Ref Range Status   02/22/2019 23 7 - 56 U/L Final     Alk Phosphatase   Date Value Ref Range Status   02/22/2019 47 25 - 110 U/L Final     Total Bilirubin   Date Value Ref Range Status   02/22/2019 0.6 0.3 - 1.2 MG/DL Final Assessment:    NIKOLOS CORKILL is a 69 y.o. male who  has a past medical history of Accidental fall, Arthritis, Cataract, DM (diabetes mellitus) (HCC), Hyperlipidemia, Hypertension, and Neuropathy. who presents for evaluation of multifocal pain.     The pain complaints are most likely due to:    1. Lumbosacral radiculopathy  [place] AMB SPINE INJECT SNRB/TFESI LUMBAR/SACRAL    MRI L-SPINE WO CONTRAST   2. DDD (degenerative disc disease), lumbosacral  MRI L-SPINE WO CONTRAST   3. Myalgia, other site         Patient has had an adequate trial of > 3 months of rest, exercise, NSAID's, multimodal treatment, and the passage of time without improvement of symptoms. The pain has significant impact on the daily quality of life.     Plan:    1. Discussed plan of care options with patient. Will schedule for repeat bilateral L5-S1 TFESI at next available appointment.  2. Will schedule for updated L-spine MRI wo contrast after procedure per patient request.  3. Continue Lyrica as prescribed.  4. May consider referral to PT in future.  5. Follow-up after imaging.     Risks/benefits of all pharmacologic and interventional treatments discussed and questions answered.

## 2020-05-10 ENCOUNTER — Encounter: Admit: 2020-05-10 | Discharge: 2020-05-10 | Payer: MEDICARE

## 2020-05-10 ENCOUNTER — Ambulatory Visit: Admit: 2020-05-10 | Discharge: 2020-05-11 | Payer: MEDICARE

## 2020-05-10 DIAGNOSIS — G629 Polyneuropathy, unspecified: Secondary | ICD-10-CM

## 2020-05-10 DIAGNOSIS — B029 Zoster without complications: Secondary | ICD-10-CM

## 2020-05-10 DIAGNOSIS — H269 Unspecified cataract: Secondary | ICD-10-CM

## 2020-05-10 DIAGNOSIS — W19XXXA Unspecified fall, initial encounter: Secondary | ICD-10-CM

## 2020-05-10 DIAGNOSIS — B0229 Other postherpetic nervous system involvement: Secondary | ICD-10-CM

## 2020-05-10 DIAGNOSIS — E119 Type 2 diabetes mellitus without complications: Secondary | ICD-10-CM

## 2020-05-10 DIAGNOSIS — I10 Essential (primary) hypertension: Secondary | ICD-10-CM

## 2020-05-10 DIAGNOSIS — M1990 Unspecified osteoarthritis, unspecified site: Secondary | ICD-10-CM

## 2020-05-10 DIAGNOSIS — E1142 Type 2 diabetes mellitus with diabetic polyneuropathy: Secondary | ICD-10-CM

## 2020-05-10 DIAGNOSIS — E785 Hyperlipidemia, unspecified: Secondary | ICD-10-CM

## 2020-05-10 MED ORDER — PREGABALIN 100 MG PO CAP
100 mg | ORAL_CAPSULE | Freq: Three times a day (TID) | ORAL | 3 refills | Status: AC
Start: 2020-05-10 — End: ?

## 2020-05-10 MED ORDER — LIDOCAINE 5 % TP OINT
Freq: Once | TOPICAL | 3 refills | 30.00000 days | Status: AC
Start: 2020-05-10 — End: ?

## 2020-05-11 ENCOUNTER — Encounter: Admit: 2020-05-11 | Discharge: 2020-05-11 | Payer: MEDICARE

## 2020-05-11 DIAGNOSIS — H269 Unspecified cataract: Secondary | ICD-10-CM

## 2020-05-11 DIAGNOSIS — I10 Essential (primary) hypertension: Secondary | ICD-10-CM

## 2020-05-11 DIAGNOSIS — M1990 Unspecified osteoarthritis, unspecified site: Secondary | ICD-10-CM

## 2020-05-11 DIAGNOSIS — W19XXXA Unspecified fall, initial encounter: Secondary | ICD-10-CM

## 2020-05-11 DIAGNOSIS — G629 Polyneuropathy, unspecified: Secondary | ICD-10-CM

## 2020-05-11 DIAGNOSIS — E119 Type 2 diabetes mellitus without complications: Secondary | ICD-10-CM

## 2020-05-11 DIAGNOSIS — E785 Hyperlipidemia, unspecified: Secondary | ICD-10-CM

## 2020-05-11 DIAGNOSIS — B029 Zoster without complications: Secondary | ICD-10-CM

## 2020-05-20 ENCOUNTER — Encounter: Admit: 2020-05-20 | Discharge: 2020-05-20 | Payer: MEDICARE

## 2020-05-20 DIAGNOSIS — M5417 Radiculopathy, lumbosacral region: Secondary | ICD-10-CM

## 2020-05-23 ENCOUNTER — Encounter: Admit: 2020-05-23 | Discharge: 2020-05-23 | Payer: MEDICARE

## 2020-05-23 ENCOUNTER — Ambulatory Visit: Admit: 2020-05-23 | Discharge: 2020-05-23 | Payer: MEDICARE

## 2020-05-23 DIAGNOSIS — E785 Hyperlipidemia, unspecified: Secondary | ICD-10-CM

## 2020-05-23 DIAGNOSIS — H269 Unspecified cataract: Secondary | ICD-10-CM

## 2020-05-23 DIAGNOSIS — M1990 Unspecified osteoarthritis, unspecified site: Secondary | ICD-10-CM

## 2020-05-23 DIAGNOSIS — M5417 Radiculopathy, lumbosacral region: Secondary | ICD-10-CM

## 2020-05-23 DIAGNOSIS — E119 Type 2 diabetes mellitus without complications: Secondary | ICD-10-CM

## 2020-05-23 DIAGNOSIS — B029 Zoster without complications: Secondary | ICD-10-CM

## 2020-05-23 DIAGNOSIS — W19XXXA Unspecified fall, initial encounter: Secondary | ICD-10-CM

## 2020-05-23 DIAGNOSIS — G629 Polyneuropathy, unspecified: Secondary | ICD-10-CM

## 2020-05-23 DIAGNOSIS — I10 Essential (primary) hypertension: Secondary | ICD-10-CM

## 2020-05-23 MED ORDER — IOHEXOL 240 MG IODINE/ML IV SOLN
2.5 mL | Freq: Once | EPIDURAL | 0 refills | Status: CP
Start: 2020-05-23 — End: ?
  Administered 2020-05-23: 19:00:00 2.5 mL via EPIDURAL

## 2020-05-23 MED ORDER — DEXAMETHASONE SODIUM PHOS (PF) 10 MG/ML IJ SOLN
15 mg | Freq: Once | 0 refills | Status: CP
Start: 2020-05-23 — End: ?
  Administered 2020-05-23: 19:00:00 15 mg

## 2020-05-23 NOTE — Progress Notes

## 2020-05-23 NOTE — Patient Instructions
Procedure Completed Today: Lumbar Transforaminal Steroid Injection    Important information following your procedure today: You may drive today    1. Pain relief may not be immediate. It is possible you may even experience an increase in pain during the first 24-48 hours followed by a gradual decrease of your pain.  2. Though the procedure is generally safe and complications are rare, we do ask that you be aware of any of the following:   ? Any swelling, persistent redness, new bleeding, or drainage from the site of the injection.  ? You should not experience a severe headache.  ? You should not run a fever over 101? F.  ? New onset of sharp, severe back & or neck pain.  ? New onset of upper or lower extremity numbness or weakness.  ? New difficulty controlling bowel or bladder function after the injection.  ? New shortness of breath.    If any of these occur, please call to report this occurrence to a nurse at [phone]. If you are calling after 4:00 p.m., on weekends or holidays please call [phone] and ask to have the resident physician on call for the physician paged or go to your local emergency room.  3. You may experience soreness at the injection site. Ice can be applied at 20 minute intervals. Avoid application of direct heat, hot showers or hot tubs today.  4. Avoid strenuous activity today. You may resume your regular activities and exercise tomorrow.  5. Patients with diabetes may see an elevation in blood sugars for 7-10 days after the injection. It is important to pay close attention to your diet, check your blood sugars daily and report extreme elevations to the physician that treats your diabetes.  6. Patients taking a daily blood thinner can resume their regular dose this evening.  7. It is important that you take all medications ordered by your pain physician. Taking medication as ordered is an important part of your pain care plan. If you cannot continue the medication plan, please notify the physician.     Possible side effects to steroids that may occur:  ? Flushing or redness of the face  ? Irritability  ? Fluid retention  ? Change in women?s menses    The following medications were used: Lidocaine , Decadron and Contrast Dye

## 2020-05-23 NOTE — Progress Notes
[facility]  INTERVENTIONAL PAIN PROCEDURE HISTORY AND PHYSICAL    No chief complaint on file.      HISTORY OF PRESENT ILLNESS:  LBP with radicular LE pain    Medical History:   Diagnosis Date   ? Accidental fall    ? Arthritis    ? Cataract    ? DM (diabetes mellitus) (HCC)    ? Hyperlipidemia    ? Hypertension    ? Neuropathy    ? Shingles        Surgical History:   Procedure Laterality Date   ? CORONARY STENT PLACEMENT  2016   ? HX CHOLECYSTECTOMY  09/2015   ? COLONOSCOPY N/A 04/20/2016    Performed by Vertell Novak, MD at [facility] ENDO   ? ESOPHAGOGASTRODUODENOSCOPY N/A 04/20/2016    Performed by Vertell Novak, MD at [facility] ENDO   ? ESOPHAGOGASTRODUODENOSCOPY BIOPSY  04/20/2016    Performed by Vertell Novak, MD at [facility] ENDO   ? UREA BREATH TEST N/A 11/13/2016    Performed by Jolee Ewing, MD at [facility] ENDO       family history includes Cancer-Lung in his father; Diabetes in his father and mother.    Social History     Socioeconomic History   ? Marital status: Separated     Spouse name: Not on file   ? Number of children: 3   ? Years of education: Not on file   ? Highest education level: Not on file   Occupational History   ? Not on file   Tobacco Use   ? Smoking status: Former Smoker     Quit date: 04/12/1971     Years since quitting: 49.1   ? Smokeless tobacco: Never Used   Vaping Use   ? Vaping Use: Never used   Substance and Sexual Activity   ? Alcohol use: Not Currently   ? Drug use: Never   ? Sexual activity: Not on file   Other Topics Concern   ? Not on file   Social History Narrative   ? Not on file       No Known Allergies    Vitals:    05/23/20 1240   BP: (!) 159/79   BP Source: Arm, Right Upper   Patient Position: Sitting   Pulse: 68   Resp: 18   Temp: 36.9 ?C (98.5 ?F)   TempSrc: Oral   SpO2: 99%   Weight: 61.2 kg (135 lb)   Height: 165.1 cm (65)   PainSc: Eight       REVIEW OF SYSTEMS: 10 point ROS obtained and negative       PHYSICAL EXAM:    General: Alert, cooperative, no acute distress.  HEENT: Normocephalic, atraumatic.  Neck: Supple.  Lungs: Unlabored respirations, bilateral and equal chest excursion.  Heart: Regular rate.  Skin: Warm and dry to touch.  Abdomen: Nondistended.  MSK: No deformity.  Neurological: Alert and oriented x3.           IMPRESSION:    1. Lumbosacral radiculopathy         PLAN: Lumbar Transforaminal Steroid Injection L5-S1

## 2020-05-24 NOTE — Procedures
Attending Surgeon: Evelina Bucy, MD    Anesthesia: Local    Pre-Procedure Diagnosis:   1. Lumbosacral radiculopathy        Post-Procedure Diagnosis:   1. Lumbosacral radiculopathy        SNR/TF LMBR/SAC  Procedure: transforaminal epidural    Laterality: bilateral   on 05/23/2020 4:30 PM  Location: lumbar -  L5-S1      Consent:   Consent obtained: written  Consent given by: patient  Risks discussed: allergic reaction, bleeding, bruising, infection, nerve damage, no change or worsening in pain, reaction to medication, seizure, swelling and weakness  Alternatives discussed: alternative treatment, delayed treatment and no treatment  Discussed with patient the purpose of the treatment/procedure, other ways of treating my condition, including no treatment/ procedure and the risks and benefits of the alternatives. Patient has decided to proceed with treatment/procedure.        Universal Protocol:  Relevant documents: relevant documents present and verified  Site marked: the operative site was marked  Patient identity confirmed: Patient identify confirmed verbally with patient.        Time out: Immediately prior to procedure a time out was called to verify the correct patient, procedure, equipment, support staff and site/side marked as required      Procedures Details:   Indications: pain   Prep: chlorhexidine  Patient position: prone  Estimated Blood Loss: minimal  Specimens: none  Number of Joints: 1  Guidance: fluoroscopy  Contrast: Procedure confirmed with contrast under live fluoroscopy.  Needle and Epidural Catheter: quincke  Needle size: 25 G  Injection procedure: Incremental injection and Negative aspiration for blood  Patient tolerance: Patient tolerated the procedure well with no immediate complications. Pressure was applied, and hemostasis was accomplished.  Outcome: Pain relieved  Comments:   LUMBAR/SACRAL TRANSFORAMINAL EPIDURAL STEROID INJECTION PROCEDURE:    1) bilateral L5-S1 transforaminal epidural steroid injection  2) Fluoroscopic needle guidance    REASON FOR PROCEDURE: Lumbar radiculopathy    PHYSICIAN: Evelina Bucy, MD    MEDICATIONS INJECTED: 0.75 mL of Dexamethasone (7.5 mg) + 0.75 ml lidocaine 1% at each level    LOCAL ANESTHETIC INJECTED: 1 mL of 1% lidocaine per site    SEDATION MEDICATIONS: None    ESTIMATED BLOOD LOSS: None    SPECIMENS REMOVED: None    COMPLICATIONS: None    TECHNIQUE: Time-out was taken to identify the correct patient, procedure and side prior to starting the procedure. Lying in a prone position, the patient was prepped and draped in the usual sterile fashion using DuraPrep and a fenestrated drape. The area to be injected was determined under fluoroscopic guidance. Local anesthetic was given by raising a skin wheal and going down to the hub of a 27-gauge 1.25-inch needle.     The 3.5-inch 25-gauge Quincke needle was advanced toward the 6 o?clock position of the pedicle at each above-named nerve root level. The needle was advanced to the final position via a lateral fluoroscopic intermittent image. Omnipaque 240 was injected under live fluoroscopy and showed epidural spread and there was no vascular runoff. After a negative aspiration, the medication was then injected.     The procedure was completed without complications and was tolerated well. The patient was monitored after the procedure. The patient (or responsible party) was given post-procedure and discharge instructions to follow at home. The patient was discharged in stable condition.           Estimated blood loss: none or minimal  Specimens: none  Patient tolerated  the procedure well with no immediate complications. Pressure was applied, and hemostasis was accomplished.

## 2020-06-15 ENCOUNTER — Ambulatory Visit: Admit: 2020-06-15 | Discharge: 2020-06-15 | Payer: MEDICARE

## 2020-06-15 ENCOUNTER — Encounter: Admit: 2020-06-15 | Discharge: 2020-06-15 | Payer: MEDICARE

## 2020-06-15 DIAGNOSIS — M5417 Radiculopathy, lumbosacral region: Secondary | ICD-10-CM

## 2020-06-15 DIAGNOSIS — M5137 Other intervertebral disc degeneration, lumbosacral region: Secondary | ICD-10-CM

## 2020-06-20 ENCOUNTER — Encounter: Admit: 2020-06-20 | Discharge: 2020-06-20 | Payer: MEDICARE

## 2020-07-29 ENCOUNTER — Encounter

## 2020-07-29 DIAGNOSIS — N528 Other male erectile dysfunction: Secondary | ICD-10-CM

## 2020-07-29 MED ORDER — SILDENAFIL 100 MG PO TAB
ORAL_TABLET | ORAL | 0 refills | 25.00000 days | Status: AC | PRN
Start: 2020-07-29 — End: ?

## 2020-11-08 ENCOUNTER — Encounter: Admit: 2020-11-08 | Discharge: 2020-11-08 | Payer: MEDICARE

## 2020-11-09 ENCOUNTER — Encounter: Admit: 2020-11-09 | Discharge: 2020-11-09 | Payer: MEDICARE

## 2020-11-13 ENCOUNTER — Encounter: Admit: 2020-11-13 | Discharge: 2020-11-13 | Payer: MEDICARE

## 2020-11-13 ENCOUNTER — Emergency Department: Admit: 2020-11-13 | Discharge: 2020-11-14 | Disposition: A | Discharge status: Home / Self Care | Payer: MEDICARE

## 2020-11-13 DIAGNOSIS — E1142 Type 2 diabetes mellitus with diabetic polyneuropathy: Secondary | ICD-10-CM

## 2020-11-13 DIAGNOSIS — E1165 Type 2 diabetes mellitus with hyperglycemia: Secondary | ICD-10-CM

## 2020-11-13 DIAGNOSIS — R238 Other skin changes: Secondary | ICD-10-CM

## 2020-11-13 LAB — CBC AND DIFF
ABSOLUTE BASO COUNT: 0 K/UL (ref 0–0.20)
ABSOLUTE EOS COUNT: 0 K/UL (ref 0–0.45)
ABSOLUTE MONO COUNT: 0.4 K/UL (ref 0–0.80)
MDW (MONOCYTE DISTRIBUTION WIDTH): 17 (ref <20.7)
WBC COUNT: 6.7 K/UL (ref 4.5–11.0)

## 2020-11-13 LAB — POC GLUCOSE
POC GLUCOSE: 397 mg/dL — ABNORMAL HIGH (ref 70–100)
POC GLUCOSE: 257 mg/dL — ABNORMAL HIGH (ref 70–100)
POC GLUCOSE: 158 mg/dL — ABNORMAL HIGH (ref 70–100)

## 2020-11-13 LAB — URINALYSIS DIPSTICK REFLEX TO CULTURE
LEUKOCYTES: NEGATIVE K/UL (ref 3–12)
NITRITE: NEGATIVE U/L (ref 7–56)
URINE ASCORBIC ACID, UA: NEGATIVE mL/min (ref 1.0–4.8)
URINE BILE: NEGATIVE U/L (ref 25–110)
URINE KETONE: NEGATIVE g/dL (ref 3.5–5.0)

## 2020-11-13 LAB — COMPREHENSIVE METABOLIC PANEL
CHLORIDE: 101 MMOL/L — ABNORMAL LOW (ref 98–110)
SODIUM: 135 MMOL/L — ABNORMAL LOW (ref 137–147)

## 2020-11-13 LAB — URINALYSIS MICROSCOPIC REFLEX TO CULTURE

## 2020-11-13 LAB — TSH WITH FREE T4 REFLEX: TSH: 1.1 uU/mL (ref 0.35–5.00)

## 2020-11-13 MED ORDER — LIDOCAINE 5 % TP PTMD
1 | Freq: Every day | TOPICAL | 0 refills | Status: DC
Start: 2020-11-13 — End: 2020-11-14
  Administered 2020-11-13: 23:00:00 1 via TOPICAL

## 2020-11-13 MED ORDER — SODIUM CHLORIDE 0.9 % IV SOLP
1000 mL | INTRAVENOUS | 0 refills | Status: CP
Start: 2020-11-13 — End: ?
  Administered 2020-11-13: 1000 mL via INTRAVENOUS

## 2020-11-13 MED ORDER — INSULIN REGULAR HUMAN(#) 1 UNIT/ML IJ SYRINGE
6 [IU] | Freq: Once | INTRAVENOUS | 0 refills | Status: CP
Start: 2020-11-13 — End: ?
  Administered 2020-11-14: 6 [IU] via INTRAVENOUS

## 2020-11-13 MED ORDER — CETIRIZINE 10 MG PO TAB
10 mg | Freq: Once | ORAL | 0 refills | Status: CP
Start: 2020-11-13 — End: ?
  Administered 2020-11-13: 23:00:00 10 mg via ORAL

## 2020-11-13 MED ORDER — CETIRIZINE 10 MG PO TAB
10 mg | ORAL_TABLET | Freq: Every day | ORAL | 0 refills | Status: AC
Start: 2020-11-13 — End: ?

## 2020-11-13 NOTE — ED Notes
P/t resting in cart, endorses multiple concerns.  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  2+ bilateral pedal pulses, no loss of sensation, no ulcers or wounds noticed on assessment.  Pt also endorses diarrhea once every 3 days that is accompanied by constipation like pain, but denies pain currently.  When asked to point to where the pain is he points to his LLQ. P/t is also concerned about itching on his back, on assessment I noticed no deformities o alterations in skin integrity, no obvious rash.  Itchy location in mid spine in upper back.  P/t states he had shingles 6 months ago but has since cleared up.  Bed in low/locked position, side rails up x2, call light in reach.  P/t remains on monitor. No needs identified at this time. RR even and unlabored A&Ox4, GCS 15, skin W/P/D

## 2020-11-14 NOTE — ED Notes
Reviewed d/c paperwork and r/xs w/ p/t, all questions answered.  P/t ambulated to exit, care relinquished. GCS 15, rr even and unlabored a&ox4.

## 2021-08-02 ENCOUNTER — Ambulatory Visit: Admit: 2021-08-02 | Discharge: 2021-08-03 | Payer: MEDICARE

## 2021-08-02 ENCOUNTER — Encounter: Admit: 2021-08-02 | Discharge: 2021-08-02 | Payer: MEDICARE

## 2021-08-02 DIAGNOSIS — H269 Unspecified cataract: Secondary | ICD-10-CM

## 2021-08-02 DIAGNOSIS — E785 Hyperlipidemia, unspecified: Secondary | ICD-10-CM

## 2021-08-02 DIAGNOSIS — W19XXXA Unspecified fall, initial encounter: Secondary | ICD-10-CM

## 2021-08-02 DIAGNOSIS — I10 Essential (primary) hypertension: Secondary | ICD-10-CM

## 2021-08-02 DIAGNOSIS — M1990 Unspecified osteoarthritis, unspecified site: Secondary | ICD-10-CM

## 2021-08-02 DIAGNOSIS — E1142 Type 2 diabetes mellitus with diabetic polyneuropathy: Secondary | ICD-10-CM

## 2021-08-02 DIAGNOSIS — E119 Type 2 diabetes mellitus without complications: Secondary | ICD-10-CM

## 2021-08-02 DIAGNOSIS — G250 Essential tremor: Secondary | ICD-10-CM

## 2021-08-02 DIAGNOSIS — B029 Zoster without complications: Secondary | ICD-10-CM

## 2021-08-02 DIAGNOSIS — G629 Polyneuropathy, unspecified: Secondary | ICD-10-CM

## 2021-08-02 MED ORDER — GABAPENTIN 300 MG PO CAP
600 mg | ORAL_CAPSULE | Freq: Three times a day (TID) | ORAL | 5 refills | Status: AC
Start: 2021-08-02 — End: ?

## 2021-08-02 NOTE — Progress Notes
Date of Service: 08/02/2021    Subjective:             Kevin Obrien is a 71 y.o. male with neuropathy.     History of Present Illness  Kevin Obrien 71 y.o. male with history of DM, HLD, HTN, and neuropathy who presented to neurology for evaluation and follow up.     Patient was last seen in clinic on 04/2020 with diabetic neuropathy and postherpetic neuralgia. He was increased on lyrica and given topical ointment. He reports having more pain in the lower extremity involving thigh, calves and toes region. He has pain to walk in the morning and has to wear shoes. He reports that he takes gabapentin in the morning and noon and lyrica at night. He still continues to have numbness and tingling at night.     He has been dealing with tremors on both hands and its noticeable when he is holding something including coffee cup. This has been noticeable in the past three months. He has difficulty wit writing. He reports that his father had tremors.      Review of Records:     Medical History:   Diagnosis Date   ? Accidental fall    ? Arthritis    ? Cataract    ? DM (diabetes mellitus) (HCC)    ? Hyperlipidemia    ? Hypertension    ? Neuropathy    ? Shingles          Surgical History:   Procedure Laterality Date   ? CORONARY STENT PLACEMENT  2016   ? HX CHOLECYSTECTOMY  09/2015   ? COLONOSCOPY N/A 04/20/2016    Performed by Vertell Novak, MD at [facility] ENDO   ? ESOPHAGOGASTRODUODENOSCOPY N/A 04/20/2016    Performed by Vertell Novak, MD at [facility] ENDO   ? ESOPHAGOGASTRODUODENOSCOPY BIOPSY  04/20/2016    Performed by Vertell Novak, MD at [facility] ENDO   ? UREA BREATH TEST N/A 11/13/2016    Performed by Jolee Ewing, MD at [facility] ENDO         Social History     Socioeconomic History   ? Marital status: Separated   ? Number of children: 3   Tobacco Use   ? Smoking status: Former     Types: Cigarettes     Quit date: 04/12/1971     Years since quitting: 50.3   ? Smokeless tobacco: Never   Vaping Use   ? Vaping Use: Never used   Substance and Sexual Activity   ? Alcohol use: Not Currently   ? Drug use: Never         Family History   Problem Relation Age of Onset   ? Diabetes Mother    ? Diabetes Father    ? Cancer-Lung Father    ? Melanoma Neg Hx    ? Cataract Neg Hx    ? Glaucoma Neg Hx    ? Macular Degen Neg Hx        No Known Allergies         Review of Systems      Objective:         ? amLODIPine (NORVASC) 10 mg tablet Take 1 tablet by mouth daily. (Patient taking differently: Take one-half tablet by mouth daily.)   ? aspirin 81 mg chewable tablet Chew one tablet by mouth daily.     ? atorvastatin (LIPITOR) 40 mg tablet Take one tablet by mouth daily.   ? capsaicin 0.025 %  topical cream Apply on the affected area for neuropathic pain.  Wash hands after application   ? carvediloL (COREG) 3.125 mg tablet carvedilol 3.125 mg tablet Take 1 tablet twice a day by oral route.   ? cetirizine (ZYRTEC) 10 mg tablet Take one tablet by mouth daily.   ? flash glucose scanning reader (FREESTYLE LIBRE 2 READER) misc Use as directed.   ? flash glucose sensor (FREESTYLE LIBRE 2 SENSOR) kit Use as directed.   ? gabapentin (NEURONTIN) 300 mg capsule Take two capsules by mouth three times daily.   ? glucose 4 gram chewable tablet Chew 4 tablets by mouth as Needed.   ? hydrOXYzine (ATARAX) 25 mg tablet Take one tablet by mouth three times daily as needed for Itching.   ? ibuprofen (ADVIL) 200 mg tablet Take one tablet by mouth every 6 hours as needed for Pain. Take with food.   ? insulin lispro (HUMALOG KWIKPEN) 100 unit/mL injection PEN Inject 18 u three times daily with meals   ? insulin pen needles (disposable) (B-D NANO; ULTICARE MICRO) 32 gauge x 5/32 pen needle Use one each as directed five times daily. Use with insulin injections.   ? Ketamine HCl 5%/Baclofen 2%/Clonidine HCl 0.2%/Gabapentin 5%/Amitriptyline HCl 2% in LipoPen Ulra Cream (COMPOUND) Apply topically to the affected area(s) as directed 1-2 times daily as needed for neuropathic pain.   ? LANTUS SOLOSTAR U-100 INSULIN 100 unit/mL (3 mL) injection PEN Inject forty Units under the skin twice daily.   ? LEVEMIR FLEXPEN 100 unit/mL (3 mL) injection pen INJECT 42 units UNDER THE SKIN ONCE DAILY   ? lisinopril (PRINIVIL, ZESTRIL) 40 mg tablet Take one tablet by mouth daily.   ? MYRBETRIQ 50 mg tablet Take one tablet by mouth daily.   ? omeprazole DR (PRILOSEC) 20 mg capsule TAKE 1 CAPSULE BY MOUTH ONCE DAILY 30 TO 60 MINUTES BEFORE A MEAL.   ? oxyCODONE/acetaminophen (PERCOCET) 5/325 mg tablet Take one tablet by mouth at bedtime as needed for Pain   ? semaglutide (OZEMPIC) 1 mg/dose (2 mg/1.5 mL) injection PEN Inject one mg under the skin every 7 days.   ? sildenafiL (VIAGRA) 100 mg tablet TAKE 1 TABLET BY MOUTH AS NEEDED FOR ERECTILE DYSFUNCTION 1 TO 4 HOURS PRIOR TO SEXUAL ACTIVITY   ? tamsulosin (FLOMAX) 0.4 mg capsule Take one capsule by mouth daily. Take 30 min after same meal.  Do not cut/ crush/ chew.     Vitals:    08/02/21 1128   BP: (!) 143/66   BP Source: Arm, Left Upper   Pulse: 80   SpO2: 98%   PainSc: Zero   Weight: 67.8 kg (149 lb 8 oz)   Height: 170.2 cm (5' 7)     Body mass index is 23.42 kg/m?Marland Kitchen     Physical Exam  General: no acute distress, cooperative    HEENT: normocephalic, atraumatic  CV: well perfused    Neurological Exam   Mental status: Alert and oriented to time, place, person and situation.  Speech: Fluent, with normal naming, comprehension, articulation and repetition  CN II-XII: Visual fields intact to confrontation, PERRL (4->2), EOMI, facial sensation intact. Symmetrical facial movement. Hearing grossly intact. Strong cough, elevates palate, uvula midline. Strong shoulder shrug. Tongue midline.  Motor: (R/L) b/l UE/LE 5/5 in proximal and distal muscle groups except for finger abductors 4/5R and 3+/5L finger flexors 5/5R and 4+/5L. Atrophied intrinsic hand muscles.  Sensory: Intact light touch,    Pin prick decreased below mid forearm  and mid shin region.   Vibration absent in toes and ankles   Reflexes: (R/L) 2+/2+Bj, 2+/2+Trj, 2+/2+Brj, 1/1Knj, 0/0Aj.   Coordination/ fine movement: Normal finger to nose, heel to shin.   Gait: Normal stance and steady gait.        Assessment and Plan:  Rhea Pink 71 y.o. male with history of DM, HLD, HTN, neuropathy and recent zoster infection involving left chest and left posterior arm who presented to neurology for evaluation and follow up.     1. Diabetic polyneuropathy associated with type 2 diabetes mellitus (HCC)    2. Essential tremor       Trial medication  - Gabapentin did not help.     RECOMMENDATIONS  - Increase gabapentin to 600mg  three times daily.   - Advised to stop lyrica to continue on gabapentin.   - After a month consider titration of gabapentin if no relief or add cymbalta.   - Advised good diabetic control to avoid further diabetic related neurological injury.   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      Orders Placed This Encounter   ? gabapentin (NEURONTIN) 300 mg capsule     FOLLOWUP PLAN  Return in about 6 months (around 01/30/2022).  The patient is instructed to contact me if there are any concerns with the agreed plan.   Problem   Essential Tremor      Total time 30 minutes.  Estimated counseling time was more than 50% of the visit time. Counseled patient regarding post herpetic neuralgia.       Linden Dolin, MD  Clinical Assistant Professor   [employer] [facility] [employer]

## 2021-08-02 NOTE — Patient Instructions
Kevin Obrien 71 y.o. male with history of DM, HLD, HTN, neuropathy and recent zoster infection involving left chest and left posterior arm who presented to neurology for evaluation and follow up.     1. Diabetic polyneuropathy associated with type 2 diabetes mellitus (HCC)    2. Essential tremor       Trial medication  - Gabapentin did not help.     RECOMMENDATIONS  - Increase gabapentin to 600mg  three times daily.   - Advised to stop lyrica to continue on gabapentin.   - After a month consider titration of gabapentin if no relief or add cymbalta.   - Advised good diabetic control to avoid further diabetic related neurological injury.   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      Orders Placed This Encounter    gabapentin (NEURONTIN) 300 mg capsule     FOLLOWUP PLAN  Return in about 6 months (around 01/30/2022).  The patient is instructed to contact me if there are any concerns with the agreed plan.       -- Preferred method of communication is through [employer], if the issue cannot wait until your next scheduled follow up.   -- MyChart may be used for non-emergent communication. Emails are not reviewed after hours or over the weekend/holidays/after 4PM. Staff will reply to your email within 24-48 business hours.       -- If you do not hear from [place] within one week of a lab or imaging study being completed, please call/send my chart email to the office to be sure that we have received the results. This is especially challenging when tests are done outside of the [facility] system, as many times results do not make it back to our office for a variety of reasons. In our office no news is good news does not apply. You should hear from [place] with results for each test.    -- If you are having acute (new/sudden onset) or severe/worsening neurologic symptoms, please call 911 or seek care in ED.    -- For scheduling of IMAGING/RADIOLOGY, please call [phone] at your convenience to schedule your studies.  -- For referrals placed during the visit, if you have not heard from scheduling within one week, please call the call center at [phone] to get scheduling assistance.  -- For refills on medications, please first contact your pharmacy, who will fax a refill authorization request form to our office.  Weekdays only. Allow up to 2 business days for refills. Please plan ahead, as refills will not be filled after hours.  -- Our front desk staff, Selena Batten or Marcelino Duster may be reached at [phone] for scheduling needs.   -- Heather RN, may be contacted at [phone] for urgent needs. Staff will return your call within 24 business hours.     For Appointments:   -- Please try to arrive early for your appointment time to help facilitate your visit. 15 minutes early is recommended.   -- If you are late to your appointment, we reserve the right to ask you to reschedule or wait until next available time to be seen in fairness to other patients scheduled that day.   -- There are times when we are running behind in clinic. Our goal is to always be on time, however, there are time when unexpected events occur with patients, which may cause a delay. We appreciate your understanding when this occurs.

## 2021-08-21 ENCOUNTER — Encounter: Admit: 2021-08-21 | Discharge: 2021-08-21 | Payer: MEDICARE

## 2021-11-09 ENCOUNTER — Encounter: Admit: 2021-11-09 | Discharge: 2021-11-09 | Payer: MEDICARE

## 2021-12-04 ENCOUNTER — Encounter: Admit: 2021-12-04 | Discharge: 2021-12-04 | Payer: No Typology Code available for payment source

## 2022-01-08 ENCOUNTER — Encounter: Admit: 2022-01-08 | Discharge: 2022-01-08 | Payer: MEDICARE

## 2022-01-08 ENCOUNTER — Ambulatory Visit: Admit: 2022-01-08 | Discharge: 2022-01-08 | Payer: MEDICARE

## 2022-01-08 DIAGNOSIS — K5900 Constipation, unspecified: Secondary | ICD-10-CM

## 2022-01-08 DIAGNOSIS — E785 Hyperlipidemia, unspecified: Secondary | ICD-10-CM

## 2022-01-08 DIAGNOSIS — E119 Type 2 diabetes mellitus without complications: Secondary | ICD-10-CM

## 2022-01-08 DIAGNOSIS — H269 Unspecified cataract: Secondary | ICD-10-CM

## 2022-01-08 DIAGNOSIS — W19XXXA Unspecified fall, initial encounter: Secondary | ICD-10-CM

## 2022-01-08 DIAGNOSIS — I209 Angina pectoris, unspecified: Secondary | ICD-10-CM

## 2022-01-08 DIAGNOSIS — I219 Acute myocardial infarction, unspecified: Secondary | ICD-10-CM

## 2022-01-08 DIAGNOSIS — M1990 Unspecified osteoarthritis, unspecified site: Secondary | ICD-10-CM

## 2022-01-08 DIAGNOSIS — G629 Polyneuropathy, unspecified: Secondary | ICD-10-CM

## 2022-01-08 DIAGNOSIS — M5137 Other intervertebral disc degeneration, lumbosacral region: Secondary | ICD-10-CM

## 2022-01-08 DIAGNOSIS — M2550 Pain in unspecified joint: Secondary | ICD-10-CM

## 2022-01-08 DIAGNOSIS — M5417 Radiculopathy, lumbosacral region: Secondary | ICD-10-CM

## 2022-01-08 DIAGNOSIS — B029 Zoster without complications: Secondary | ICD-10-CM

## 2022-01-08 DIAGNOSIS — M7918 Myalgia, other site: Secondary | ICD-10-CM

## 2022-01-08 DIAGNOSIS — I10 Essential (primary) hypertension: Secondary | ICD-10-CM

## 2022-01-12 ENCOUNTER — Encounter: Admit: 2022-01-12 | Discharge: 2022-01-12 | Payer: MEDICARE

## 2022-01-17 ENCOUNTER — Encounter: Admit: 2022-01-17 | Discharge: 2022-01-17 | Payer: MEDICARE

## 2022-01-26 ENCOUNTER — Encounter: Admit: 2022-01-26 | Discharge: 2022-01-26 | Payer: MEDICARE

## 2022-01-29 ENCOUNTER — Encounter: Admit: 2022-01-29 | Discharge: 2022-01-29 | Payer: MEDICARE

## 2022-01-29 ENCOUNTER — Ambulatory Visit: Admit: 2022-01-29 | Discharge: 2022-01-29 | Payer: MEDICARE

## 2022-01-29 DIAGNOSIS — I10 Essential (primary) hypertension: Secondary | ICD-10-CM

## 2022-01-29 DIAGNOSIS — G629 Polyneuropathy, unspecified: Secondary | ICD-10-CM

## 2022-01-29 DIAGNOSIS — H269 Unspecified cataract: Secondary | ICD-10-CM

## 2022-01-29 DIAGNOSIS — K5900 Constipation, unspecified: Secondary | ICD-10-CM

## 2022-01-29 DIAGNOSIS — M5417 Radiculopathy, lumbosacral region: Secondary | ICD-10-CM

## 2022-01-29 DIAGNOSIS — I219 Acute myocardial infarction, unspecified: Secondary | ICD-10-CM

## 2022-01-29 DIAGNOSIS — M1990 Unspecified osteoarthritis, unspecified site: Secondary | ICD-10-CM

## 2022-01-29 DIAGNOSIS — M2550 Pain in unspecified joint: Secondary | ICD-10-CM

## 2022-01-29 DIAGNOSIS — E119 Type 2 diabetes mellitus without complications: Secondary | ICD-10-CM

## 2022-01-29 DIAGNOSIS — M5416 Radiculopathy, lumbar region: Secondary | ICD-10-CM

## 2022-01-29 DIAGNOSIS — M5137 Other intervertebral disc degeneration, lumbosacral region: Secondary | ICD-10-CM

## 2022-01-29 DIAGNOSIS — E785 Hyperlipidemia, unspecified: Secondary | ICD-10-CM

## 2022-01-29 DIAGNOSIS — W19XXXA Unspecified fall, initial encounter: Secondary | ICD-10-CM

## 2022-01-29 DIAGNOSIS — B029 Zoster without complications: Secondary | ICD-10-CM

## 2022-01-29 DIAGNOSIS — I209 Angina pectoris, unspecified: Secondary | ICD-10-CM

## 2022-01-29 MED ORDER — IOHEXOL 240 MG IODINE/ML IV SOLN
1 mL | Freq: Once | EPIDURAL | 0 refills | Status: CP
Start: 2022-01-29 — End: ?
  Administered 2022-01-29: 22:00:00 1 mL via EPIDURAL

## 2022-01-29 MED ORDER — DEXAMETHASONE SODIUM PHOS (PF) 10 MG/ML IJ EPIDURAL SOLN
15 mg | Freq: Once | EPIDURAL | 0 refills | Status: CP
Start: 2022-01-29 — End: ?
  Administered 2022-01-29: 22:00:00 15 mg via EPIDURAL

## 2022-01-29 NOTE — Progress Notes

## 2022-01-29 NOTE — Progress Notes
SPINE CENTER  INTERVENTIONAL PAIN PROCEDURE HISTORY AND PHYSICAL    Chief Complaint:   1. Lumbar radiculopathy        HISTORY OF PRESENT ILLNESS:    Patient returns today for interventional treatment of pain secondary to the above chief complaint.   Patient denies any recent fevers, chills, infection, antibiotics, coagulopathy or contra-indicated anticoagulants.   Risks of the procedure were discussed including but not limited to:   Post procedure pain, infection, bleeding, damage to surrounding structures (including muscles, blood vessels, nerve roots, spinal cord) and reaction to medications.   Patient reports understanding and has elected to proceed with the procedure.        This patient's clinical history, exam, AND imaging support radiculopathy AND there is a significant impact on quality of life and function AND the pain has been present for at least 4 weeks AND they have failed to improve with noninvasive conservative care.    This patient's pain is so severe it results in a significant degree of functional disability. Prior ESI has provided at least a 50% improvement in pain and function for at least 3 months.                 Medical History:   Diagnosis Date   ? Accidental fall    ? Angina pectoris (HCC)    ? Arthritis    ? Cataract    ? Constipation    ? DM (diabetes mellitus) (HCC)    ? Heart attack (HCC)    ? Hyperlipidemia    ? Hypertension    ? Joint pain    ? Neuropathy    ? Shingles        Surgical History:   Procedure Laterality Date   ? CORONARY STENT PLACEMENT  2016   ? HX CHOLECYSTECTOMY  09/2015   ? COLONOSCOPY N/A 04/20/2016    Performed by Vertell Novak, MD at [facility] ENDO   ? ESOPHAGOGASTRODUODENOSCOPY N/A 04/20/2016    Performed by Vertell Novak, MD at [facility] ENDO   ? ESOPHAGOGASTRODUODENOSCOPY BIOPSY  04/20/2016    Performed by Vertell Novak, MD at [facility] ENDO   ? UREA BREATH TEST N/A 11/13/2016    Performed by Jolee Ewing, MD at [facility] ENDO       family history includes Cancer-Lung in his father; Diabetes in his father and mother.    Social History     Socioeconomic History   ? Marital status: Separated   ? Number of children: 3   Tobacco Use   ? Smoking status: Former     Packs/day: 2.00     Years: 10.00     Additional pack years: 0.00     Total pack years: 20.00     Types: Cigarettes     Quit date: 04/12/1971     Years since quitting: 50.8   ? Smokeless tobacco: Never   ? Tobacco comments:     Former smoker/quit years ago   Vaping Use   ? Vaping Use: Never used   Substance and Sexual Activity   ? Alcohol use: Not Currently   ? Drug use: Never   ? Sexual activity: Not Currently     Partners: Female     Birth control/protection: None       No Known Allergies      REVIEW OF SYSTEMS: 10 point ROS obtained and negative except as above.      PHYSICAL EXAM:  General: Alert, cooperative, no acute distress.  HEENT: Normocephalic, atraumatic.  Neck: Supple.  Chest: Unlabored respirations, bilateral and equal chest excursion.  Skin: Dry.  Abdomen: Nondistended.  MSK: Functional movement of all extremities.   Neurological: Alert and oriented x3.  Speech is clear. No tremors.       IMPRESSION:    1. Lumbar radiculopathy           PLAN: bilateral L4-5 TFESI      Chart reviewed.   Patient expressed understanding and consent to proceed.   Written consent signed.   Case discussed with attending physician.       Gareth Morgan, M.D.   Fellow; Interventional Pain Medicine  Department of Anesthesiology and Pain Medicine   01/29/2022   12:03 PM

## 2022-01-29 NOTE — Procedures
Attending Surgeon: Evelina Bucy, MD    Anesthesia: Local    Pre-Procedure Diagnosis:   1. Lumbar radiculopathy    2. Lumbosacral radiculopathy    3. DDD (degenerative disc disease), lumbosacral        Post-Procedure Diagnosis:   1. Lumbar radiculopathy    2. Lumbosacral radiculopathy    3. DDD (degenerative disc disease), lumbosacral        Pain Score: Seven    Transforaminal Lumbar/Sacral  Procedure: transforaminal epidural    Laterality: bilateral   on 01/29/2022 4:30 PM  Location: lumbar -  L4-5      Consent:   Consent obtained: written  Consent given by: patient  Risks discussed: allergic reaction, bleeding, infection, nerve damage, no change or worsening in pain, reaction to medication, seizure and weakness  Alternatives discussed: alternative treatment, delayed treatment, no treatment and referral  Discussed with patient the purpose of the treatment/procedure, other ways of treating my condition, including no treatment/ procedure and the risks and benefits of the alternatives. Patient has decided to proceed with treatment/procedure.        Universal Protocol:  Relevant documents: relevant documents present and verified  Test results: test results available and properly labeled  Imaging studies: imaging studies available  Required items: required blood products, implants, devices, and special equipment available  Site marked: the operative site was marked  Patient identity confirmed: Patient identify confirmed verbally with patient.        Time out: Immediately prior to procedure a time out was called to verify the correct patient, procedure, equipment, support staff and site/side marked as required      Procedures Details:   Indications: pain   Number of Levels: 1  Guidance: fluoroscopy  Needle and Epidural Catheter: quincke  Needle size: 25 G  Patient tolerance: Patient tolerated the procedure well with no immediate complications. Pressure was applied, and hemostasis was accomplished.  Comments: LUMBAR/SACRAL TRANSFORAMINAL EPIDURAL STEROID INJECTION PROCEDURE:    1) bilateral L4-5 transforaminal epidural steroid injection  2) Fluoroscopic needle guidance    REASON FOR PROCEDURE: Lumbar radiculopathy    PHYSICIAN: Evelina Bucy, MD    MEDICATIONS INJECTED: 0.75 mL of Dexamethasone (7.5 mg) + 0.75 ml lidocaine 1% at each level    LOCAL ANESTHETIC INJECTED: 1 mL of 1% lidocaine per site    SEDATION MEDICATIONS: None    ESTIMATED BLOOD LOSS: None    SPECIMENS REMOVED: None    COMPLICATIONS: None    TECHNIQUE: Time-out was taken to identify the correct patient, procedure and side prior to starting the procedure. Lying in a prone position, the patient was prepped and draped in the usual sterile fashion using DuraPrep and a fenestrated drape. The area to be injected was determined under fluoroscopic guidance. Local anesthetic was given by raising a skin wheal and going down to the hub of a 27-gauge 1.25-inch needle.     The 3.5-inch 25-gauge Quincke needle was advanced toward the 6 o?clock position of the pedicle at each above-named nerve root level. The needle was advanced to the final position via a lateral fluoroscopic intermittent image. Omnipaque 240 was injected under live fluoroscopy and showed epidural spread and there was no vascular runoff. After a negative aspiration, the medication was then injected.     The procedure was completed without complications and was tolerated well. The patient was monitored after the procedure. The patient (or responsible party) was given post-procedure and discharge instructions to follow at home. The patient was discharged in stable condition.  This patient's clinical history, exam, AND imaging support radiculopathy AND there is a significant impact on quality of life and function AND the pain has been present for at least 4 weeks AND they have failed to improve with noninvasive conservative care.              Estimated blood loss: none or minimal  Specimens: none  Patient tolerated the procedure well with no immediate complications. Pressure was applied, and hemostasis was accomplished.

## 2022-01-29 NOTE — Patient Instructions
Procedure Completed Today: Lumbar Transforaminal Steroid Injection    Important information following your procedure today: You may drive today    Pain relief may not be immediate. It is possible you may even experience an increase in pain during the first 24-48 hours followed by a gradual decrease of your pain.  Though the procedure is generally safe and complications are rare, we do ask that you be aware of any of the following:   Any swelling, persistent redness, new bleeding, or drainage from the site of the injection.  You should not experience a severe headache.  You should not run a fever over 101? F.  New onset of sharp, severe back & or neck pain.  New onset of upper or lower extremity numbness or weakness.  New difficulty controlling bowel or bladder function after the injection.  New shortness of breath.    If any of these occur, please call to report this occurrence to a nurse at [phone]. If you are calling after 4:00 p.m., on weekends or holidays please call [phone] and ask to have the resident physician on call for the physician paged or go to your local emergency room.  You may experience soreness at the injection site. Ice can be applied at 20 minute intervals. Avoid application of direct heat, hot showers or hot tubs today.  Avoid strenuous activity today. You may resume your regular activities and exercise tomorrow.  Patients with diabetes may see an elevation in blood sugars for 7-10 days after the injection. It is important to pay close attention to your diet, check your blood sugars daily and report extreme elevations to the physician that treats your diabetes.  Patients taking a daily blood thinner can resume their regular dose this evening.  It is important that you take all medications ordered by your pain physician. Taking medication as ordered is an important part of your pain care plan. If you cannot continue the medication plan, please notify the physician.     Possible side effects to steroids that may occur:  Flushing or redness of the face  Irritability  Fluid retention  Change in women?s menses    The following medications were used: Lidocaine , Triamcinolone  , Contrast Dye, and Normal Saline

## 2022-03-10 ENCOUNTER — Encounter: Admit: 2022-03-10 | Discharge: 2022-03-10 | Payer: MEDICARE

## 2022-07-19 ENCOUNTER — Encounter: Admit: 2022-07-19 | Discharge: 2022-07-19 | Payer: MEDICARE

## 2022-08-01 ENCOUNTER — Encounter: Admit: 2022-08-01 | Discharge: 2022-08-01 | Payer: MEDICARE

## 2022-08-10 ENCOUNTER — Ambulatory Visit: Admit: 2022-08-10 | Discharge: 2022-08-10 | Payer: MEDICARE

## 2022-08-10 DIAGNOSIS — K625 Hemorrhage of anus and rectum: Secondary | ICD-10-CM

## 2022-08-15 ENCOUNTER — Encounter: Admit: 2022-08-15 | Discharge: 2022-08-15 | Payer: MEDICARE

## 2022-08-15 DIAGNOSIS — I219 Acute myocardial infarction, unspecified: Secondary | ICD-10-CM

## 2022-08-15 DIAGNOSIS — G629 Polyneuropathy, unspecified: Secondary | ICD-10-CM

## 2022-08-15 DIAGNOSIS — E119 Type 2 diabetes mellitus without complications: Secondary | ICD-10-CM

## 2022-08-15 DIAGNOSIS — M2550 Pain in unspecified joint: Secondary | ICD-10-CM

## 2022-08-15 DIAGNOSIS — I2510 Atherosclerotic heart disease of native coronary artery without angina pectoris: Secondary | ICD-10-CM

## 2022-08-15 DIAGNOSIS — I10 Essential (primary) hypertension: Secondary | ICD-10-CM

## 2022-08-15 DIAGNOSIS — B029 Zoster without complications: Secondary | ICD-10-CM

## 2022-08-15 DIAGNOSIS — N529 Male erectile dysfunction, unspecified: Secondary | ICD-10-CM

## 2022-08-15 DIAGNOSIS — W19XXXA Unspecified fall, initial encounter: Secondary | ICD-10-CM

## 2022-08-15 DIAGNOSIS — K5900 Constipation, unspecified: Secondary | ICD-10-CM

## 2022-08-15 DIAGNOSIS — E785 Hyperlipidemia, unspecified: Secondary | ICD-10-CM

## 2022-08-15 DIAGNOSIS — I209 Angina pectoris, unspecified: Secondary | ICD-10-CM

## 2022-08-15 DIAGNOSIS — H269 Unspecified cataract: Secondary | ICD-10-CM

## 2022-08-15 DIAGNOSIS — N400 Enlarged prostate without lower urinary tract symptoms: Secondary | ICD-10-CM

## 2022-08-15 DIAGNOSIS — M1990 Unspecified osteoarthritis, unspecified site: Secondary | ICD-10-CM

## 2022-08-20 ENCOUNTER — Encounter: Admit: 2022-08-20 | Discharge: 2022-08-20 | Payer: MEDICARE

## 2022-10-17 ENCOUNTER — Encounter: Admit: 2022-10-17 | Discharge: 2022-10-17 | Payer: MEDICARE

## 2022-10-17 DIAGNOSIS — M5416 Radiculopathy, lumbar region: Secondary | ICD-10-CM

## 2022-10-19 ENCOUNTER — Encounter: Admit: 2022-10-19 | Discharge: 2022-10-19 | Payer: MEDICARE

## 2022-10-25 ENCOUNTER — Encounter: Admit: 2022-10-25 | Discharge: 2022-10-25 | Payer: MEDICARE

## 2022-11-09 ENCOUNTER — Encounter: Admit: 2022-11-09 | Discharge: 2022-11-09 | Payer: MEDICARE

## 2022-12-06 ENCOUNTER — Encounter: Admit: 2022-12-06 | Discharge: 2022-12-06 | Payer: MEDICARE

## 2023-03-06 ENCOUNTER — Encounter: Admit: 2023-03-06 | Discharge: 2023-03-06 | Payer: No Typology Code available for payment source

## 2023-03-06 ENCOUNTER — Ambulatory Visit: Admit: 2023-03-06 | Discharge: 2023-03-06 | Payer: No Typology Code available for payment source

## 2023-03-06 DIAGNOSIS — H269 Unspecified cataract: Secondary | ICD-10-CM

## 2023-03-06 DIAGNOSIS — N400 Enlarged prostate without lower urinary tract symptoms: Secondary | ICD-10-CM

## 2023-03-06 DIAGNOSIS — I2510 Atherosclerotic heart disease of native coronary artery without angina pectoris: Secondary | ICD-10-CM

## 2023-03-06 DIAGNOSIS — E785 Hyperlipidemia, unspecified: Secondary | ICD-10-CM

## 2023-03-06 DIAGNOSIS — I10 Essential (primary) hypertension: Secondary | ICD-10-CM

## 2023-03-06 DIAGNOSIS — M1990 Unspecified osteoarthritis, unspecified site: Secondary | ICD-10-CM

## 2023-03-06 DIAGNOSIS — W19XXXA Unspecified fall, initial encounter: Secondary | ICD-10-CM

## 2023-03-06 DIAGNOSIS — E119 Type 2 diabetes mellitus without complications: Secondary | ICD-10-CM

## 2023-03-06 DIAGNOSIS — M2550 Pain in unspecified joint: Secondary | ICD-10-CM

## 2023-03-06 DIAGNOSIS — B029 Zoster without complications: Secondary | ICD-10-CM

## 2023-03-06 DIAGNOSIS — N529 Male erectile dysfunction, unspecified: Secondary | ICD-10-CM

## 2023-03-06 DIAGNOSIS — I219 Acute myocardial infarction, unspecified: Secondary | ICD-10-CM

## 2023-03-06 DIAGNOSIS — K5900 Constipation, unspecified: Secondary | ICD-10-CM

## 2023-03-06 DIAGNOSIS — I209 Angina pectoris, unspecified: Secondary | ICD-10-CM

## 2023-03-06 DIAGNOSIS — G629 Polyneuropathy, unspecified: Secondary | ICD-10-CM

## 2023-06-05 ENCOUNTER — Emergency Department: Admit: 2023-06-05 | Discharge: 2023-06-05 | Payer: MEDICARE

## 2023-06-05 ENCOUNTER — Encounter: Admit: 2023-06-05 | Discharge: 2023-06-05 | Payer: MEDICARE

## 2023-06-05 DIAGNOSIS — E119 Type 2 diabetes mellitus without complications: Secondary | ICD-10-CM

## 2023-06-05 DIAGNOSIS — R739 Hyperglycemia, unspecified: Secondary | ICD-10-CM

## 2023-06-05 DIAGNOSIS — R809 Proteinuria, unspecified: Secondary | ICD-10-CM

## 2023-06-05 DIAGNOSIS — R4182 Altered mental status, unspecified: Secondary | ICD-10-CM

## 2023-06-05 DIAGNOSIS — J101 Influenza due to other identified influenza virus with other respiratory manifestations: Secondary | ICD-10-CM

## 2023-06-05 DIAGNOSIS — R7989 Other specified abnormal findings of blood chemistry: Secondary | ICD-10-CM

## 2023-06-05 MED ORDER — ASPIRIN 81 MG PO CHEW
81 mg | Freq: Every day | ORAL | 0 refills | Status: DC
Start: 2023-06-05 — End: 2023-06-09
  Administered 2023-06-06 – 2023-06-09 (×4): 81 mg via ORAL

## 2023-06-05 MED ORDER — CARVEDILOL 3.125 MG PO TAB
3.125 mg | Freq: Once | ORAL | 0 refills | Status: CP
Start: 2023-06-05 — End: ?
  Administered 2023-06-06: 03:00:00 3.125 mg via ORAL

## 2023-06-05 MED ORDER — ONDANSETRON HCL (PF) 4 MG/2 ML IJ SOLN
4 mg | INTRAVENOUS | 0 refills | Status: DC | PRN
Start: 2023-06-05 — End: 2023-06-09

## 2023-06-05 MED ORDER — MELATONIN 5 MG PO TAB
5 mg | Freq: Every evening | ORAL | 0 refills | Status: DC | PRN
Start: 2023-06-05 — End: 2023-06-09

## 2023-06-05 MED ORDER — MIRABEGRON 50 MG PO TB24
50 mg | Freq: Every day | ORAL | 0 refills | Status: DC
Start: 2023-06-05 — End: 2023-06-09
  Administered 2023-06-06 – 2023-06-09 (×4): 50 mg via ORAL

## 2023-06-05 MED ORDER — OSELTAMIVIR 75 MG PO CAP
75 mg | Freq: Once | ORAL | 0 refills | Status: CP
Start: 2023-06-05 — End: ?
  Administered 2023-06-06: 05:00:00 75 mg via ORAL

## 2023-06-05 MED ORDER — HEPARIN (PORCINE) IN 5 % DEX 20,000 UNIT/500 ML (40 UNIT/ML) IV SOLP
0-2000 [IU]/h | INTRAVENOUS | 0 refills | Status: DC
Start: 2023-06-05 — End: 2023-06-06
  Administered 2023-06-06: 07:00:00 756 [IU]/h via INTRAVENOUS

## 2023-06-05 MED ORDER — AMLODIPINE 5 MG PO TAB
10 mg | Freq: Every day | ORAL | 0 refills | Status: DC
Start: 2023-06-05 — End: 2023-06-09
  Administered 2023-06-06 – 2023-06-09 (×4): 10 mg via ORAL

## 2023-06-05 MED ORDER — TAMSULOSIN 0.4 MG PO CAP
.4 mg | Freq: Every day | ORAL | 0 refills | Status: DC
Start: 2023-06-05 — End: 2023-06-09
  Administered 2023-06-06 – 2023-06-09 (×4): 0.4 mg via ORAL

## 2023-06-05 MED ORDER — AMLODIPINE 5 MG PO TAB
10 mg | Freq: Once | ORAL | 0 refills | Status: CP
Start: 2023-06-05 — End: ?
  Administered 2023-06-06: 03:00:00 10 mg via ORAL

## 2023-06-05 MED ORDER — HEPARIN, PORCINE (PF) 5,000 UNIT/0.5 ML IJ SYRG
5000 [IU] | SUBCUTANEOUS | 0 refills | Status: DC
Start: 2023-06-05 — End: 2023-06-06

## 2023-06-05 MED ORDER — INSULIN GLARGINE 100 UNIT/ML (3 ML) SC INJ PEN
42 [IU] | Freq: Every evening | SUBCUTANEOUS | 0 refills | Status: DC
Start: 2023-06-05 — End: 2023-06-09
  Administered 2023-06-06: 06:00:00 42 [IU] via SUBCUTANEOUS

## 2023-06-05 MED ORDER — HEPARIN (PORCINE) 1,000 UNIT/ML IJ SOLN
70 [IU]/kg | Freq: Once | INTRAVENOUS | 0 refills | Status: DC
Start: 2023-06-05 — End: 2023-06-06

## 2023-06-05 MED ORDER — CARVEDILOL 3.125 MG PO TAB
3.125 mg | Freq: Two times a day (BID) | ORAL | 0 refills | Status: DC
Start: 2023-06-05 — End: 2023-06-06

## 2023-06-05 MED ORDER — INSULIN ASPART 100 UNIT/ML SC FLEXPEN
18 [IU] | Freq: Three times a day (TID) | SUBCUTANEOUS | 0 refills | Status: DC
Start: 2023-06-05 — End: 2023-06-09

## 2023-06-05 MED ORDER — ATORVASTATIN 40 MG PO TAB
40 mg | Freq: Every day | ORAL | 0 refills | Status: DC
Start: 2023-06-05 — End: 2023-06-07
  Administered 2023-06-06 – 2023-06-07 (×2): 40 mg via ORAL

## 2023-06-05 MED ORDER — CARVEDILOL 3.125 MG PO TAB
3.125 mg | Freq: Two times a day (BID) | ORAL | 0 refills | Status: DC
Start: 2023-06-05 — End: 2023-06-09
  Administered 2023-06-06 – 2023-06-09 (×6): 3.125 mg via ORAL

## 2023-06-05 MED ORDER — HEPARIN (PORCINE) BOLUS FOR CONTINUOUS INFUSION (VIAL) - APTT MAIN
20-40 [IU]/kg | INTRAVENOUS | 0 refills | Status: DC | PRN
Start: 2023-06-05 — End: 2023-06-06

## 2023-06-05 MED ORDER — CAPSAICIN 0.025 % TP CREA
Freq: Three times a day (TID) | TOPICAL | 0 refills | Status: DC | PRN
Start: 2023-06-05 — End: 2023-06-09

## 2023-06-05 MED ORDER — GABAPENTIN 300 MG PO CAP
600 mg | Freq: Three times a day (TID) | ORAL | 0 refills | Status: DC
Start: 2023-06-05 — End: 2023-06-09
  Administered 2023-06-06: 14:00:00 600 mg via ORAL

## 2023-06-05 MED ORDER — LISINOPRIL 20 MG PO TAB
40 mg | Freq: Every day | ORAL | 0 refills | Status: DC
Start: 2023-06-05 — End: 2023-06-09
  Administered 2023-06-06: 14:00:00 40 mg via ORAL

## 2023-06-05 MED ORDER — SENNOSIDES-DOCUSATE SODIUM 8.6-50 MG PO TAB
1 | Freq: Every day | ORAL | 0 refills | Status: DC | PRN
Start: 2023-06-05 — End: 2023-06-09

## 2023-06-05 MED ORDER — ASPIRIN 81 MG PO CHEW
324 mg | Freq: Once | ORAL | 0 refills | Status: CP
Start: 2023-06-05 — End: ?
  Administered 2023-06-06: 04:00:00 324 mg via ORAL

## 2023-06-05 MED ORDER — ONDANSETRON 4 MG PO TBDI
4 mg | ORAL | 0 refills | Status: DC | PRN
Start: 2023-06-05 — End: 2023-06-09

## 2023-06-05 MED ORDER — POLYETHYLENE GLYCOL 3350 17 GRAM PO PWPK
1 | Freq: Every day | ORAL | 0 refills | Status: DC | PRN
Start: 2023-06-05 — End: 2023-06-09

## 2023-06-05 MED ORDER — ACETAMINOPHEN 325 MG PO TAB
650 mg | Freq: Once | ORAL | 0 refills | Status: CP
Start: 2023-06-05 — End: ?
  Administered 2023-06-06: 02:00:00 650 mg via ORAL

## 2023-06-05 MED ORDER — SODIUM CHLORIDE 0.9% IV SOLP
500 mL | INTRAVENOUS | 0 refills | Status: CP
Start: 2023-06-05 — End: ?
  Administered 2023-06-06: 02:00:00 500 mL via INTRAVENOUS

## 2023-06-05 MED ORDER — PANTOPRAZOLE 20 MG PO TBEC
20 mg | Freq: Every day | ORAL | 0 refills | Status: DC
Start: 2023-06-05 — End: 2023-06-09
  Administered 2023-06-06 – 2023-06-09 (×4): 20 mg via ORAL

## 2023-06-06 ENCOUNTER — Inpatient Hospital Stay: Admit: 2023-06-06 | Discharge: 2023-06-06 | Payer: MEDICARE

## 2023-06-06 ENCOUNTER — Inpatient Hospital Stay: Admit: 2023-06-06 | Discharge: 2023-06-09 | Disposition: A | Discharge status: Home / Self Care | Payer: Medicare Other

## 2023-06-06 ENCOUNTER — Encounter: Admit: 2023-06-06 | Discharge: 2023-06-06 | Payer: MEDICARE

## 2023-06-06 LAB — URINALYSIS DIPSTICK REFLEX TO CULTURE
~~LOC~~ BKR NITRITE: NEGATIVE
~~LOC~~ BKR URINE BILE: NEGATIVE

## 2023-06-06 LAB — 2D + DOPPLER ECHO
AV HCM GRAD VALS: 29 mmHg
AV LVOT PEAK GRADIENT: 27 mmHg
AV PEAK VELOCITY: 1.4 m/s (ref 137–147)
BSA: 1.6 m2
DOP CALC LVOT AREA: 2.8 cm2
DOP CALC LVOT DIAMETER: 1.9 cm
E WAVE DECELARTION TIME: 173 ms (ref 98–110)
E/A RATIO: 0.6
ECHO EF: 70 %
EJECTION FRACTION: 68 %
FRACTIONAL SHORTENING: 40 % (ref 28–44)
INTERVENTRICULAR SEPTUM: 1.2 cm (ref 0.6–1)
LATERAL E/E' RATIO: 10
LEFT ATRIUM INDEX: 16 mL/m2 (ref 16–34)
LEFT ATRIUM SIZE: 2.9 cm (ref 3–4)
LEFT ATRIUM VOLUME: 27 mL (ref 18–58)
LEFT INTERNAL DIMENSION IN SYSTOLE: 2.2 cm (ref 2.5–4)
LEFT VENTRICLE DIASTOLIC VOLUME INDEX: 44 mL/m2 (ref 34–74)
LEFT VENTRICLE DIASTOLIC VOLUME: 74 mL — ABNORMAL HIGH (ref 62–150)
LEFT VENTRICLE SYSTOLIC VOLUME INDEX: 17 mL/m2 (ref 11–31)
LEFT VENTRICLE SYSTOLIC VOLUME: 28 mL (ref 21–61)
LEFT VENTRICULAR INTERNAL DIMENSION IN DIASTOLE: 3.7 cm (ref 4.2–5.8)
LEFT VENTRICULAR MASS: 113 g (ref 88–224)
MEDIAL E/E' RATIO: 12
MV PEAK A VEL: 1 m/s — ABNORMAL HIGH (ref 70–100)
MV PEAK E VEL PW: 0.6 m/s (ref 7–25)
POSTERIOR WALL: 0.8 cm (ref 0.6–1)
PROX AORTA: 2.7 cm — ABNORMAL HIGH (ref 2.6–3.8)
RA PRESSURE: 3
RELATIVE WALL THICKNESS: 0.4 (ref <=0.42)
RIGHT ATRIAL AREA: 10 cm2 — ABNORMAL LOW (ref >60–<18)
RIGHT HEART SYSTOLIC MMODE TAPSE: 1.7 cm (ref 8.5–>1.7)
RIGHT HEART SYSTOLIC TDI S': 0.1 m/s
RIGHT VENTRICULAR BASAL DIAMETER: 2.6 cm — ABNORMAL HIGH (ref 2.5–4.1)
RIGHT VENTRICULAR MID DIAMETER: 2.2 cm — ABNORMAL LOW (ref 1.9–3.5)
SINUS: 2.9 cm — ABNORMAL LOW (ref 2.8–4)
TDI LATERAL E': 0 m/s
TDI MEDIAL E': 0 m/s

## 2023-06-06 LAB — CBC AND DIFF
~~LOC~~ BKR ABSOLUTE BASO COUNT: 0 10*3/uL — AB (ref 0.00–0.20)
~~LOC~~ BKR ABSOLUTE EOS COUNT: 0 10*3/uL (ref 0.00–0.45)
~~LOC~~ BKR ABSOLUTE LYMPH COUNT: 0.6 10*3/uL — ABNORMAL LOW (ref 1.00–4.80)
~~LOC~~ BKR ABSOLUTE MONO COUNT: 1 10*3/uL — ABNORMAL HIGH (ref 0.00–0.80)
~~LOC~~ BKR ABSOLUTE NEUTROPHIL: 7.1 10*3/uL — ABNORMAL HIGH (ref 1.80–7.00)
~~LOC~~ BKR BASOPHILS %: 0 % — ABNORMAL HIGH (ref 0–2)
~~LOC~~ BKR EOSINOPHILS %: 0 % (ref 0–5)
~~LOC~~ BKR HEMATOCRIT: 45 % — ABNORMAL LOW (ref 40.0–50.0)
~~LOC~~ BKR LYMPHOCYTES %: 7 % — ABNORMAL LOW (ref 24–44)
~~LOC~~ BKR MCH: 31 pg — ABNORMAL HIGH (ref 26.0–34.0)
~~LOC~~ BKR MCHC: 35 g/dL (ref 32.0–36.0)
~~LOC~~ BKR MCV: 86 fL — ABNORMAL HIGH (ref 80.0–100.0)
~~LOC~~ BKR MONOCYTES %: 12 % — ABNORMAL HIGH (ref 4–12)
~~LOC~~ BKR MPV: 9.7 fL (ref 7.0–11.0)
~~LOC~~ BKR PLATELET COUNT: 154 10*3/uL (ref 150–400)
~~LOC~~ BKR RBC COUNT: 5.3 10*6/uL — ABNORMAL LOW (ref 4.40–5.50)
~~LOC~~ BKR RDW: 13 % (ref 11.0–15.0)
~~LOC~~ BKR WBC COUNT: 8.8 10*3/uL (ref 4.50–11.00)

## 2023-06-06 LAB — COVID INFLUENZA A/B AND RSV PCR: ~~LOC~~ BKR INFLUENZA A VIRUS: DETECTED — AB

## 2023-06-06 LAB — HIGH SENSITIVITY TROPONIN I 4 HR
~~LOC~~ BKR HI SEN TNI DELTA 4-2: 19
~~LOC~~ BKR HIGH SENSITIVITY TROPONIN I 4 HOUR: 286 ng/L — ABNORMAL HIGH (ref <20)

## 2023-06-06 LAB — KC ED MAIN ECG TRIAGE ONLY
P AXIS: 62 degrees
P-R INTERVAL: 134 ms
Q-T INTERVAL: 354 ms
QRS DURATION: 112 ms
QTC CALCULATION (BAZETT): 492 ms
T AXIS: 48 degrees
VENTRICULAR RATE: 116 {beats}/min

## 2023-06-06 LAB — ECG 12-LEAD
P-R INTERVAL: 138 ms
QRS DURATION: 116 ms
VENTRICULAR RATE: 103 {beats}/min

## 2023-06-06 LAB — LIPID PROFILE
~~LOC~~ BKR CHOLESTEROL: 184 mg/dL (ref <200)
~~LOC~~ BKR HDL: 27 mg/dL — ABNORMAL LOW (ref >40)
~~LOC~~ BKR TRIGLYCERIDES: 207 mg/dL — ABNORMAL HIGH (ref 4.0–<150)

## 2023-06-06 LAB — PROTEIN/CR RATIO,UR RAN
~~LOC~~ BKR PROT CREAT RAT/CAL: 4.6 — ABNORMAL HIGH (ref <0.15)
~~LOC~~ BKR UR CREATININE, RAN: 80 mg/dL
~~LOC~~ BKR UR TOTAL PROTEIN,RAN: 374 mg/dL

## 2023-06-06 LAB — HIGH SENSITIVITY TROPONIN I 2 HOUR
~~LOC~~ BKR HIGH SENSITIVITY TROPONIN I 2 HOUR: 267 ng/L — ABNORMAL HIGH (ref <20)
~~LOC~~ BKR HIGH SENSITIVITY TROPONIN I DELTA VALUE: 18

## 2023-06-06 LAB — POC GLUCOSE
~~LOC~~ BKR POC GLUCOSE: 284 mg/dL — ABNORMAL HIGH (ref 70–100)
~~LOC~~ BKR POC GLUCOSE: 147 mg/dL — ABNORMAL HIGH (ref 70–100)
~~LOC~~ BKR POC GLUCOSE: 157 mg/dL — ABNORMAL HIGH (ref 70–100)
~~LOC~~ BKR POC GLUCOSE: 198 mg/dL — ABNORMAL HIGH (ref 70–100)
~~LOC~~ BKR POC GLUCOSE: 343 mg/dL — ABNORMAL HIGH (ref 70–100)

## 2023-06-06 LAB — HIGH SENSITIVITY TROPONIN I, RANDOM: ~~LOC~~ BKR HIGH SENSITIVITY TROPONIN I: 270 ng/L — ABNORMAL HIGH (ref <20)

## 2023-06-06 LAB — MRSA BY PCR (NASAL)

## 2023-06-06 LAB — PTT (APTT)
~~LOC~~ BKR PTT: 28 s (ref 24.0–36.5)
~~LOC~~ BKR PTT: 71 s — ABNORMAL HIGH (ref 24.0–36.5)

## 2023-06-06 LAB — COMPREHENSIVE METABOLIC PANEL
~~LOC~~ BKR ALBUMIN: 4.3 g/dL — ABNORMAL HIGH (ref 3.5–5.0)
~~LOC~~ BKR POTASSIUM: 3.9 mmol/L (ref 3.5–5.1)

## 2023-06-06 MED ORDER — SODIUM CHLORIDE 0.9 % IJ SOLN
10 mL | Freq: Once | INTRAVENOUS | 0 refills | Status: CP
Start: 2023-06-06 — End: ?
  Administered 2023-06-06: 15:00:00 10 mL via INTRAVENOUS

## 2023-06-06 MED ORDER — ENOXAPARIN 40 MG/0.4 ML SC SYRG
40 mg | Freq: Every day | SUBCUTANEOUS | 0 refills | Status: DC
Start: 2023-06-06 — End: 2023-06-09
  Administered 2023-06-07 – 2023-06-09 (×3): 40 mg via SUBCUTANEOUS

## 2023-06-06 MED ORDER — CEFTRIAXONE INJ 2GM IVP
2 g | INTRAVENOUS | 0 refills | Status: DC
Start: 2023-06-06 — End: 2023-06-08
  Administered 2023-06-07 – 2023-06-08 (×2): 2 g via INTRAVENOUS

## 2023-06-06 MED ORDER — INSULIN ASPART 100 UNIT/ML SC FLEXPEN
0-6 [IU] | Freq: Before meals | SUBCUTANEOUS | 0 refills | Status: DC
Start: 2023-06-06 — End: 2023-06-09
  Administered 2023-06-06: 09:00:00 4 [IU] via SUBCUTANEOUS

## 2023-06-06 MED ORDER — OSELTAMIVIR 75 MG PO CAP
75 mg | Freq: Two times a day (BID) | ORAL | 0 refills | Status: DC
Start: 2023-06-06 — End: 2023-06-07
  Administered 2023-06-06 – 2023-06-07 (×2): 75 mg via ORAL

## 2023-06-06 MED ORDER — DOXYCYCLINE 100 MG/100 ML IVPB (MB+)
100 mg | Freq: Two times a day (BID) | INTRAVENOUS | 0 refills | Status: DC
Start: 2023-06-06 — End: 2023-06-08
  Administered 2023-06-07 – 2023-06-08 (×6): 100 mg via INTRAVENOUS

## 2023-06-06 MED ORDER — HEPARIN (PORCINE) 1,000 UNIT/ML IJ SOLN
70 [IU]/kg | Freq: Once | INTRAVENOUS | 0 refills | Status: CP
Start: 2023-06-06 — End: ?
  Administered 2023-06-06: 07:00:00 4410 [IU] via INTRAVENOUS

## 2023-06-06 MED ORDER — DEXTROSE 50 % IN WATER (D50W) IV SYRG
12.5-25 g | INTRAVENOUS | 0 refills | Status: DC | PRN
Start: 2023-06-06 — End: 2023-06-09

## 2023-06-06 MED ORDER — PERFLUTREN LIPID MICROSPHERES 1.1 MG/ML IV SUSP
1-10 mL | Freq: Once | INTRAVENOUS | 0 refills | Status: CP | PRN
Start: 2023-06-06 — End: ?
  Administered 2023-06-06: 15:00:00 3 mL via INTRAVENOUS

## 2023-06-06 MED ORDER — LACTATED RINGERS IV SOLP
1000 mL | Freq: Once | INTRAVENOUS | 0 refills | Status: CP
Start: 2023-06-06 — End: ?
  Administered 2023-06-07: 1000 mL via INTRAVENOUS

## 2023-06-06 MED ORDER — ACETAMINOPHEN 325 MG PO TAB
650 mg | ORAL | 0 refills | Status: DC | PRN
Start: 2023-06-06 — End: 2023-06-06

## 2023-06-06 MED ORDER — SODIUM CHLORIDE 0.9% IV SOLP
500 mL | Freq: Once | INTRAVENOUS | 0 refills | Status: AC
Start: 2023-06-06 — End: ?

## 2023-06-06 MED ORDER — IPRATROPIUM-ALBUTEROL 0.5 MG-3 MG(2.5 MG BASE)/3 ML IN NEBU
3 mL | RESPIRATORY_TRACT | 0 refills | Status: DC | PRN
Start: 2023-06-06 — End: 2023-06-06

## 2023-06-06 MED ORDER — LORAZEPAM 2 MG/ML IJ SOLN GROUP
.5 mg | Freq: Once | INTRAVENOUS | 0 refills | Status: DC | PRN
Start: 2023-06-06 — End: 2023-06-07

## 2023-06-06 MED ORDER — HEPARIN (PORCINE) BOLUS FOR CONTINUOUS INFUSION (VIAL) - APTT MAIN
20-40 [IU]/kg | INTRAVENOUS | 0 refills | Status: DC | PRN
Start: 2023-06-06 — End: 2023-06-06

## 2023-06-06 MED ORDER — ACETAMINOPHEN 1,000 MG/100 ML (10 MG/ML) IV SOLN
1000 mg | Freq: Once | INTRAVENOUS | 0 refills | Status: CP
Start: 2023-06-06 — End: ?
  Administered 2023-06-07: 02:00:00 1000 mg via INTRAVENOUS

## 2023-06-06 MED ORDER — ACETAMINOPHEN 325 MG PO TAB
650 mg | ORAL | 0 refills | Status: DC | PRN
Start: 2023-06-06 — End: 2023-06-09
  Administered 2023-06-06: 14:00:00 650 mg via ORAL

## 2023-06-06 NOTE — Consults
CARDIOLOGY CONSULT NOTE      Kevin Obrien  Admission Date:  06/05/2023                     Assessment & Recs   Kevin Obrien is a 72 y.o. patient with the following problems:    Principal Problem:    AMS (altered mental status)  Active Problems:    Diabetes mellitus (HCC)    Hyperlipidemia    Peripheral artery disease (HCC)    Diabetic polyneuropathy associated with type 2 diabetes mellitus (HCC)    Essential tremor    Underweight      History of Present Illness: Kevin Obrien is a 72 y.o. male with a past medical history of NSTEMI 2015/CAD s/p PCI 2015 to diagonal branch with DES, PAD, HTN, HLD, DM2, diabetic neuropathy, BPH, and prior history of tobacco use. Primary cardiologist is Dr. Kirby Funk.     He was recently seen by Dr. Magnus Ivan on 10/01/2022 at that time patient was doing well.    Patient was admitted for AMS on 06/05/2023.  Daughter at the bedside.  States patient has been febrile.  Per daughter he was found on the ground around 10 AM in his bedroom.  He has been confused over the last few days.  He has been around family who had flu.  He was found influenza A.  CT head was without acute changes.  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    Cardiology team was consulted for elevated high-sensitivity troponin.  Patient denies any anginal symptoms.  Lethargic, however, but arousable and nodding to questions.  K3.4, sodium 133.  Glucose 366 on admit.    Assessment:  Influenza A-treated with oseltamivir  Elevated high-sensitivity troponin  HTN-uncontrolled on admit.  SBP 2 2 on admit.  Has improved with reinitiation of PTA medications  History of CAD/NSTEMI in 2015 s/p PCI to diagonal branch in 2015.  Denies anginal symptoms  LVOT gradient in the setting of small LV cavity and hypertension  HLD-LDL 119 on 06/05/2023.  PTA treated with statins  Uncontrolled DM2 with diabetic neuropathy-A1c 11.9% on 06/05/2023.  PTA treated with insulin  Prior history of tobacco use  BPH- treated with tamsulosin      Recommendations:  Mildly elevated high-sensitivity troponin with a  flat trend and no significant delta change.  ECG without dynamic.  Suspect type II demand ischemia in the setting of influenza and hypertension on admit SBP 202/91 on admit.  No anginal symptoms.  Recommend follow-up with primary cardiologist once recovered from current illness.  Maintain adequate hydration as to not worsen LVOT gradient. LV is small. Avoid aggressive diuretics or vasodilators. Suspect small LV cavity (3.70) with hypertension and not true LVOT gradient. Will defer on going management to primary cardiologist.   Currently treated with amlodipine 10 Mg p.o. daily, aspirin 1 Mg daily, atorvastatin 40 Mg p.o. daily, carvedilol 3.125 Mg p.o. twice daily, and lisinopril 40 Mg p.o. daily.   Please keep K above/= 4.0 and Mag above/= 2.0   Please arrange follow-up with primary cardiologist Dr. Magnus Ivan.    Thank you for the opportunity to participate in the care of your patient.  Please call with questions or concerns.    Discussed with Cardiology attending.  Please see attestation by Dr. Althea Charon for final recommendations or changes to above plan.   Discussed with Dr. Orson Ape with Primary team.  Would be happy to see patient again as needed.  Please call as needed for questions or new developments.  Thank you!     Lurline Del, PA-C  Department of Cardiovascular Medicine  [facility]  Available on [place], Pager [phone]  _____________________________________________________________________________    Reason for Consult:  Troponin elevation with a concern of NSTEMI       UUV:OZDG bibasilar linear atelectasis or scarring. No consolidation.     ECG: ST 103 bpm, BBB  All results for 12-Lead ECG this visit   ECG 12-LEAD    Collection Time: 06/05/23  9:18 PM   Result Value Status    VENTRICULAR RATE 103 Final P-R INTERVAL 138 Final    QRS DURATION 116 Final    Q-T INTERVAL 392 Final    QTC CALCULATION (BAZETT) 513 Final    P AXIS 64 Final    R AXIS -84 Final    T AXIS 53 Final    Impression    Sinus tachycardia  Left axis deviation  Right bundle branch block  Septal infarct (cited on or before 05-Jun-2023)  Abnormal ECG  When compared with ECG of 05-Jun-2023 19:39,  No significant change was found  Confirmed by Dangers, Jonathan ([id number]) on 06/05/2023 9:45:16 PM       Echocardiograms 06/06/2023:  Normal sized left ventricle. Normal overall LV wall thickness.  However moderate basal to mid septal hypertrophy present with maximal septal thickness measured at 1.36 cm  Hyperdynamic left ventricular systolic function with estimated ejection fraction of 70% by biplane Simpson's method. No regional wall motion abnormalities.   Systolic anterior motion of the mitral valve with dynamic LVOT obstruction present with peak gradient of 27 mmHg at rest.  Patient unable to reliably perform Valsalva.  Grade 1 diastolic dysfunction with normal estimated left atrial pressure.  Normal sized right ventricle. Normal right ventricular systolic function.  Normal sized left atrium. Normal sized right atrium..   Nonspecific thickening and mild calcification of the anterior mitral leaflet.  Focal thickening and calcification of the aortic valve.  PA systolic pressure could not be reliably estimated due to inadequate TR jet.   No pericardial effusion.   Prior study for comparison: Limited echo 02/23/2019. Septal wall thickness appears to have slightly increased compared to prior echo. Overall LV function is similar. Similar dynamic LVOT obstruction/mid ventricular obstruction was noted on the prior echo as well. Peak gradient obtained at rest was 21 mmHg on prior study     Telemetry monitor: ST 101 bpm, BBB    Regadenoson Stress tests 02/23/19: Low risk, LVEF 59%, summed stress score 1, summed rest score 0. 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 There is prominent collateralization from the superior mesenteric artery circulation to supply the celiac artery circulation due to the celiac artery origin stenosis. The inferior mesenteric artery is widely patent.  No significant renal artery stenosis.  No significant aortic or pelvic arterial inflow stenosis.  On the right there are 2 tandem stenoses within the above-knee popliteal artery resulting in 50-70% diameter stenosis. Three-vessel runoff to the foot.  On the left there is mild calcified atherosclerotic disease with 30% stenosis of the above-knee popliteal artery. Three-vessel runoff to the foot.  Colonic diverticulosis.  Prior cholecystectomy.    Cardiac catheterizations 09/18/13: DES to diagonal branch reviewing primary cardiologist note from [facility].  Details unknown.    Home Medications  Medications Prior to Admission   Medication Sig    amLODIPine (NORVASC) 10 mg tablet Take 1 tablet by mouth daily. (Patient taking differently: Take one-half tablet by mouth daily.)    aspirin 81 mg chewable tablet Chew one tablet by mouth daily.      atorvastatin (LIPITOR) 40 mg tablet Take one tablet by mouth daily.    capsaicin 0.025 % topical cream Apply on the affected area for neuropathic pain.  Wash hands after application    carvediloL (COREG) 3.125 mg tablet carvedilol 3.125 mg tablet Take 1 tablet twice a day by oral route.    cetirizine (ZYRTEC) 10 mg tablet Take one tablet by mouth daily.    flash glucose scanning reader (FREESTYLE LIBRE 2 READER) misc Use as directed.    flash glucose sensor (FREESTYLE LIBRE 2 SENSOR) kit Use as directed.    gabapentin (NEURONTIN) 300 mg capsule Take two capsules by mouth three times daily.    glucose 4 gram chewable tablet Chew 4 tablets by mouth as Needed.    hydrOXYzine (ATARAX) 25 mg tablet Take one tablet by mouth three times daily as needed for Itching.    ibuprofen (ADVIL) 200 mg tablet Take one tablet by mouth every 6 hours as needed for Pain. Take with food.    insulin lispro (HUMALOG KWIKPEN) 100 unit/mL injection PEN Inject 18 u three times daily with meals    insulin pen needles (disposable) (B-D NANO; ULTICARE MICRO) 32 gauge x 5/32 pen needle Use one each as directed five times daily. Use with insulin injections.    Ketamine HCl 5%/Baclofen 2%/Clonidine HCl 0.2%/Gabapentin 5%/Amitriptyline HCl 2% in LipoPen Ulra Cream (COMPOUND) Apply topically to the affected area(s) as directed 1-2 times daily as needed for neuropathic pain.    LANTUS SOLOSTAR U-100 INSULIN 100 unit/mL (3 mL) injection PEN Inject forty Units under the skin twice daily.    LEVEMIR FLEXPEN 100 unit/mL (3 mL) injection pen INJECT 42 units UNDER THE SKIN ONCE DAILY    lisinopril (PRINIVIL, ZESTRIL) 40 mg tablet Take one tablet by mouth daily.    MYRBETRIQ 50 mg tablet Take one tablet by mouth daily.    omeprazole DR (PRILOSEC) 20 mg capsule TAKE 1 CAPSULE BY MOUTH ONCE DAILY 30 TO 60 MINUTES BEFORE A MEAL.    oxyCODONE/acetaminophen (PERCOCET) 5/325 mg tablet Take one tablet by mouth at bedtime as needed for Pain    peg-electrolyte solution (NULYTELY) 420 gram oral solution The day prior to your procedure: At 11:00 A.M. fill the Nulytely/Golytely jug to the fill line with lukewarm drinking water, cap the jug and shake to dissolve the powder.  Place the jug in the refrigerator. At 5:00 P.M. on the day PRIOR to your procedure: drink one 8-ounce glass of Nulytely/Golytely and repeat every (10-15) minutes until you have finished the first three fourths gallon of Nulytely/Golytely (3) liters. Place remaining liquid in the refrigerator. Day of Procedure: Five hours before your scheduled procedure time drink the remaining Nulytely/Golytely 8-ounces at a time. Repeat every (10-15) minutes until you have finished.  Indications: emptying of the bowel semaglutide (OZEMPIC) 1 mg/dose (2 mg/1.5 mL) injection PEN Inject one mg under the skin every 7 days.    sildenafiL (VIAGRA) 100 mg tablet TAKE 1 TABLET BY MOUTH AS NEEDED FOR ERECTILE DYSFUNCTION 1 TO 4 HOURS PRIOR TO SEXUAL ACTIVITY    tamsulosin (FLOMAX) 0.4 mg capsule Take one capsule by mouth daily. Take 30  min after same meal.  Do not cut/ crush/ chew.       Current Medications  Scheduled Meds:amLODIPine (NORVASC) tablet 10 mg, 10 mg, Oral, QDAY  aspirin chewable tablet 81 mg, 81 mg, Oral, QDAY  atorvastatin (LIPITOR) tablet 40 mg, 40 mg, Oral, QDAY  carvediloL (COREG) tablet 3.125 mg, 3.125 mg, Oral, BID  gabapentin (NEURONTIN) capsule 600 mg, 600 mg, Oral, TID  insulin aspart (U-100) (NOVOLOG FLEXPEN U-100 INSULIN) injection PEN 0-6 Units, 0-6 Units, Subcutaneous, ACHS (22)  insulin aspart (U-100) (NOVOLOG FLEXPEN U-100 INSULIN) injection PEN 18 Units, 18 Units, Subcutaneous, TID w/ meals  insulin glargine (LANTUS SOLOSTAR U-100 INSULIN) injection PEN 42 Units, 42 Units, Subcutaneous, QHS(22)  lisinopriL (ZESTRIL) tablet 40 mg, 40 mg, Oral, QDAY  mirabegron (MYRBETRIQ) ER tablet 50 mg, 50 mg, Oral, QDAY  oseltamivir (TAMIFLU) capsule 75 mg, 75 mg, Oral, BID  pantoprazole DR (PROTONIX) tablet 20 mg, 20 mg, Oral, QDAY(21)  sodium chloride PF 0.9% injection 10 mL, 10 mL, Intravenous, ONCE  tamsulosin (FLOMAX) capsule 0.4 mg, 0.4 mg, Oral, QDAY    Continuous Infusions:   heparin (porcine) 20,000 units/D5W 500 mL infusion (std conc)(premade) 756 Units/hr (06/06/23 0829)     PRN and Respiratory Meds:acetaminophen Q4H PRN, capsaicin TID PRN, dextrose 50% (D50) IV PRN, heparin (porcine) Q6H PRN, LORazepam  (ATIVAN)  injection Once PRN, melatonin QHS PRN, ondansetron Q6H PRN **OR** ondansetron (ZOFRAN) IV Q6H PRN, perflutren lipid microspheres Once PRN **AND** sodium chloride PF 0.9% ONCE, polyethylene glycol 3350 QDAY PRN, sennosides-docusate sodium QDAY PRN       Allergies  Allergies   Allergen Reactions Seasonal Allergies RHINITIS       Past Medical History  Past Medical History:    Accidental fall    Angina pectoris (HCC)    Arthritis    BPH (benign prostatic hyperplasia)    Cataract    Constipation    Coronary artery disease    DM (diabetes mellitus) (HCC)    ED (erectile dysfunction)    Heart attack (HCC)    Hyperlipidemia    Hypertension    Joint pain    Neuropathy    Shingles       Past Surgical History  Surgical History:   Procedure Laterality Date    CORONARY STENT PLACEMENT  2016    HX CHOLECYSTECTOMY  09/2015    COLONOSCOPY N/A 04/20/2016    Performed by Vertell Novak, MD at [facility] ENDO    ESOPHAGOGASTRODUODENOSCOPY N/A 04/20/2016    Performed by Vertell Novak, MD at [facility] ENDO    ESOPHAGOGASTRODUODENOSCOPY BIOPSY  04/20/2016    Performed by Vertell Novak, MD at [facility] ENDO    UREA BREATH TEST N/A 11/13/2016    Performed by Jolee Ewing, MD at [facility] ENDO       Social History  Social History     Socioeconomic History    Marital status: Separated    Number of children: 3   Tobacco Use    Smoking status: Former     Current packs/day: 0.00     Average packs/day: 2.0 packs/day for 10.0 years (20.0 ttl pk-yrs)     Types: Cigarettes     Start date: 04/11/1961     Quit date: 04/12/1971     Years since quitting: 52.1    Smokeless tobacco: Never    Tobacco comments:     Former smoker/quit years ago   Vaping Use    Vaping status: Never Used   Substance and Sexual Activity  Alcohol use: Not Currently    Drug use: Never    Sexual activity: Not Currently     Partners: Female     Birth control/protection: None       Family History  Family History   Problem Relation Name Age of Onset    Diabetes Mother AMPARO CUBIAS     Diabetes Father JOSE Jalomo     Cancer-Lung Father JOSE Cullifer     Melanoma Neg Hx      Cataract Neg Hx      Glaucoma Neg Hx      Macular Degen Neg Hx         Review of Systems    14 point ROS completed and negative except as noted per HPI    Physical Exam:  Vital Signs: Last Filed In 24 Hours Vital Signs: 24 Hour Range   BP: 133/76 (12/26 0744)  Temp: 39.4 ?C (103 ?F) (12/26 7846)  Pulse: 102 (12/26 0744)  Respirations: 20 PER MINUTE (12/26 0744)  SpO2: 94 % (12/26 0744)  O2 Device: None (Room air) (12/26 0744)  Height: 165.1 cm (5' 5) (12/25 2333) BP: (121-202)/(62-96)   Temp:  [37.1 ?C (98.8 ?F)-39.4 ?C (103 ?F)]   Pulse:  [88-115]   Respirations:  [14 PER MINUTE-25 PER MINUTE]   SpO2:  [94 %-98 %]   O2 Device: None (Room air)   Intensity Pain Scale (Self Report): 6 (06/05/23 1941)   Intake/Output Summary (Last 24 hours) at 06/06/2023 0919  Last data filed at 06/06/2023 0100  Gross per 24 hour   Intake --   Output 500 ml   Net -500 ml        Physical Exam    General Appearance: no acute distress, A&Ox3, skinny  HEENT: EOMI, MM-moist, post OP-clear  Neck: no JVD, no carotid bruits  RESP: lungs CTAB, no rales, rhonchi, or wheezing  Cardiac: regular rate and rhythm. Normal S1 & S2, no S3 or S4, no rub. no cardiac murmurs noted  Abdominal Exam: bowel sounds present, soft, non-tender, non-distended  Extremities: No LE edema.   Skin: warm & intact.  no obvious lesions noted    Lab/Radiology/Other Diagnostic Tests:  CBC w/Diff    Lab Results   Component Value Date/Time    WBC 7.70 06/06/2023 03:36 AM    RBC 4.47 06/06/2023 03:36 AM    HGB 13.7 06/06/2023 03:36 AM    HCT 39.7 (L) 06/06/2023 03:36 AM    MCV 88.7 06/06/2023 03:36 AM    MCH 30.7 06/06/2023 03:36 AM    MCHC 34.6 06/06/2023 03:36 AM    RDW 12.5 06/06/2023 03:36 AM    PLTCT 154 06/06/2023 03:36 AM    MPV 9.8 06/06/2023 03:36 AM    Lab Results   Component Value Date/Time    NEUT 69 06/06/2023 03:36 AM    ANC 5.30 06/06/2023 03:36 AM    LYMA 20 (L) 06/06/2023 03:36 AM    ALC 1.50 06/06/2023 03:36 AM    MONA 10 06/06/2023 03:36 AM    AMC 0.80 06/06/2023 03:36 AM    EOSA 1 06/06/2023 03:36 AM    AEC 0.00 06/06/2023 03:36 AM    BASA 0 06/06/2023 03:36 AM    ABC 0.00 06/06/2023 03:36 AM       Comprehensive Metabolic Profile    Lab Results   Component Value Date/Time NA 133 (L) 06/06/2023 03:36 AM    K 3.4 (L) 06/06/2023 03:36 AM    CL 100 06/06/2023 03:36  AM    CO2 24 06/06/2023 03:36 AM    GAP 9 06/06/2023 03:36 AM    BUN 20 06/06/2023 03:36 AM    CR 1.07 06/06/2023 03:36 AM    GLU 315 (H) 06/06/2023 03:36 AM    Lab Results   Component Value Date/Time    CA 8.4 (L) 06/06/2023 03:36 AM    ALBUMIN 3.5 06/06/2023 03:36 AM    TOTPROT 6.0 06/06/2023 03:36 AM    ALKPHOS 49 06/06/2023 03:36 AM    AST 28 06/06/2023 03:36 AM    ALT 19 06/06/2023 03:36 AM    TOTBILI 0.5 06/06/2023 03:36 AM    GFR >60 06/06/2023 03:36 AM    GFRAA >60 02/22/2019 06:28 PM       Thyroid Studies    Lab Results   Component Value Date/Time    TSH 0.40 06/05/2023 08:09 PM    Lab Results   Component Value Date/Time    TNI 0.00 02/22/2019 11:41 PM

## 2023-06-06 NOTE — Progress Notes
Pharmacy High Risk Medication Review    A high risk medication review has been performed for [facility] by a pharmacy team member.      Medication class(es) to review on inpatient med list:  Benzodiazepines      The medications class(es) are noted to be high risk to mobility or mentation in patients >74 years of age. If your patient has been CAM positive (diagnosed with delirium) at any point during this hospitalization then it would be particularly helpful to review these medications. Each medication has its own unique risks and benefits to the patient, we ask that these medications are reviewed and considered for de-prescribing where appropriate.     Consider reviewing [email or web address]es for anticholinergic burden calculation.  If you need further assistance identifying or de-prescribing, please contact pharmacy.      Current Inpatient Medications:  Scheduled Meds:amLODIPine (NORVASC) tablet 10 mg, 10 mg, Oral, QDAY  aspirin chewable tablet 81 mg, 81 mg, Oral, QDAY  atorvastatin (LIPITOR) tablet 40 mg, 40 mg, Oral, QDAY  carvediloL (COREG) tablet 3.125 mg, 3.125 mg, Oral, BID  enoxaparin (LOVENOX) syringe 40 mg, 40 mg, Subcutaneous, QDAY(21)  gabapentin (NEURONTIN) capsule 600 mg, 600 mg, Oral, TID  insulin aspart (U-100) (NOVOLOG FLEXPEN U-100 INSULIN) injection PEN 0-6 Units, 0-6 Units, Subcutaneous, ACHS (22)  insulin aspart (U-100) (NOVOLOG FLEXPEN U-100 INSULIN) injection PEN 18 Units, 18 Units, Subcutaneous, TID w/ meals  insulin glargine (LANTUS SOLOSTAR U-100 INSULIN) injection PEN 42 Units, 42 Units, Subcutaneous, QHS(22)  lisinopriL (ZESTRIL) tablet 40 mg, 40 mg, Oral, QDAY  mirabegron (MYRBETRIQ) ER tablet 50 mg, 50 mg, Oral, QDAY  oseltamivir (TAMIFLU) capsule 75 mg, 75 mg, Oral, BID  pantoprazole DR (PROTONIX) tablet 20 mg, 20 mg, Oral, QDAY(21)  tamsulosin (FLOMAX) capsule 0.4 mg, 0.4 mg, Oral, QDAY    Continuous Infusions:  PRN and Respiratory Meds:acetaminophen Q4H PRN, capsaicin TID PRN, dextrose 50% (D50) IV PRN, LORazepam  (ATIVAN)  injection Once PRN, melatonin QHS PRN, ondansetron Q6H PRN **OR** ondansetron (ZOFRAN) IV Q6H PRN, polyethylene glycol 3350 QDAY PRN, sennosides-docusate sodium QDAY PRN        Thank you,  Valetta Close, [facility]  06/06/2023

## 2023-06-06 NOTE — Progress Notes
HC5 END OF SHIFT/PLAN OF CARE NURSING NOTE   Nursing Shift: Day Shift 0700-1900    Acute events, nursing interventions, & communication with providers: No acute events. Pts famliy at bedside. Pt lethargic but arousable, family said this has been happening for weeks. Pt has no complaints of pain. Pt continuing on heparin gtt and getting echo/MRI of head today.     0800: Pt temperature 103F oral. RN notified Dr. Nikki Dom with Med 3. Orders placed for PO tylenol and BC x2.     Temperature recheck 1000: 100.3 oral. Dr at bedside and aware of temperature. NNO. Heparin gtt discontinued, transitioning to SQ lovenox.     1800: Pts temp 102.0 F oral. RN messaged Dr. Cain Sieve with Med 3. Pt unable to swallow tylenol pills, IV tylenol ordered. Pt has not been drinking/voiding since about 1300. RN discussed ordering IV fluids to help keep up his hydration. IV fluids bolus ordered.       Patient Goal(s)  Patient will Improve their nutritional intake  by the end of next shift.        Patient will  Be able to ambulate without breathing difficulty by discharge.   Admission Weight: Weight: 49.7 kg (109 lb 9.6 oz)    Last 3 Weights:   Vitals:    06/05/23 1939 06/05/23 2333 06/06/23 0744   Weight: 49.7 kg (109 lb 9.6 oz) 63 kg (138 lb 14.2 oz) 62.6 kg (138 lb)     Weight Change: Weight trend stable    Intake/Output Summary (Last 24 hours) at 06/06/2023 1824  Last data filed at 06/06/2023 1700  Gross per 24 hour   Intake 563.01 ml   Output 750 ml   Net -186.99 ml     Last Bowel Movement Date:  (PTA)    Fluid Restriction? No   Quality/Safety    Total Fall Risk Score: 17   Risk for Injury related to falls: Standard risk for injury based on the ABCS scoring tool  Fall Risk Category:   History of More Than One Fall Within 6 Months Before Admission: Yes  Elimination, Bowel and Urine: N/A  Interventions: Place male/male urinal within reach   Medications: On 2 or more high fall risk drugs  Interventions: Use of gait belt , Stay within arm's reach during toileting/showering (i.e., dizziness, orthostasis) , Educate patient on medication side effects, and Bed/chair alarm (i.e., change in mental status)   Patient Care Equipment: One present  Interventions: Needs assistance with patient care equipment when ambulating and Ensure environment is free of clutter and walkways are clear from tripping hazards  Mobility: 2 - Assistance required, 2 - Unsteady gait  Interventions: Assist x2, Gait belt in use when ambulating, Use of additional staff for handling patient equipment, Utilize walker, cane, or additional walking aid for ambulation, and Elimination equipment at bedside (urinal or commode)   Cognition: 0 - No cognition issues  Interventions: Bed/Chair Alarm  and Stay within arm's reach while patient ambulating/toileting/showering    Other safety precautions in place: HAPI Prevention Bundle    Restraints:  No      Patient Education  This RN provided education to Patient and Family today. The following education topics were reviewed:  Quality/Safety Education:   Fall risk and Pressure injury prevention/care  Medication Education:   Medication management (Indication, adverse effects, monitoring, etc)  Education provided on the following medication(s): Heparin  Cardiac - Specific Education:   Pt does not have an active cardiovascular diagnosis/condition  General  Education:   Bowel/Urinary elimination, Diet/nutrition, Discharge planning, Glucose management, and Mobility/Activity intolerance    The following teaching method(s) were used: Verbal and Handout  Response to learning: Verbalizes Understanding  Needs reinforcement on: Education provided to family. Pt lethergic but easily arousable.

## 2023-06-06 NOTE — Care Coordination-Inpatient
Med Private Night 8 - [phone] Kevin Obrien (available on Voalte) will take calls on this patient until 8 am on 06/06/2023. Afterwards, please contact Med 3 team for any questions or concerns. Voalte is the preferred way of communication.     Gust Brooms M.D.  Night AOD  On Voalte ---- 8 pm to 8 am    .

## 2023-06-06 NOTE — Progress Notes
RT Adult Assessment Note    NAME:Kevin Obrien             MRN: [id number]             DOB:1951-05-12          AGE: 72 y.o.  ADMISSION DATE: 06/05/2023             DAYS ADMITTED: LOS: 1 day    Additional Comments:  Impressions of the patient: Patient was in bed on room air and not in respiratory distress. Patient replied no to all RT Evaluation questions.    Intervention(s)/outcome(s): Criteria Not Met  Patient education that was completed: NA  Recommendations to the care team: NA    Vital Signs:  Pulse: 92  RR: 18 PER MINUTE  SpO2: 97 %  O2 Device: None (Room air)  Liter Flow:    O2%:      Breath Sounds:   Right Apex Breath Sounds: Clear (Implies normal)  Right Base Breath Sounds: Decreased  Left Apex Breath Sounds: Clear (Implies normal)  Left Base Breath Sounds: Decreased  Respiratory Effort: Unlabored  Respiratory WDL: Within Defined Limits  Comments: Criteria Not Met. Please reorder if clinical status changes.

## 2023-06-06 NOTE — H&P (View-Only)
Admission History and Physical    Name:  Kevin Obrien                                                     MRN:  [id number]   Admission Date:  06/05/2023  Admission Diagnosis: AMS (altered mental status) [R41.82]     Principal Problem:    AMS (altered mental status)  Active Problems:    Diabetes mellitus (HCC)    Hyperlipidemia    Peripheral artery disease (HCC)    Diabetic polyneuropathy associated with type 2 diabetes mellitus (HCC)    Essential tremor    Underweight      Assessment/Plan:     Kevin Obrien is a 72 y.o. male with PMH of CAD, Diabetes mellitus type II, HTN, HLD and PVD who presents to the ER with AMS.     # AMS   # Fall with LOC  # ? TIA   - Pt presented with acute AMS, slow response and flu like symptoms.   - Patient alert and oriented to himself with no acute focal deficit.   - Stable vitals   - Labs notable for hyperglycemia and lactic acidosis   - CT Head with No acute intracranial hemorrhage or mass effect. 2.  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     Plan:   > Admit patient to the floor   > Maintain patient on Telemetry   > Lactic acidosis resolved with fluid   > Frequent neuro check    > Avoid sedative medications   > Obtain 2D ECHO and MRI Head   > ASA and statin     # Influenza A   - Symptomatic with no oxygen requirement  - CXR with Mild bibasilar linear atelectasis or scarring. No consolidation.  - Tamiflu   - Supportive treatment.     # Elevated troponin with a concern for NSTEMI   # history of NSTEMI,   # CAD status post PCI  # PVC  - Pt endorse some shortness of breath but denies chest pain.   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      Plan:   > Maintain patient on Telemetry   > Trend troponin with serial ECG   > Will start heparin drip given up trending troponin   > ASA and statin   > Resume Coreg   > Obtain A1c, lipid panel and 2D ECHO  >  Consult cardiology     # HTN  # HLD  - continue Amlodipine, Coreg and lisinopril     # Type II DM   # Peripheral neuropathy   - Continue home insulin regimen and gabapentin   - LDCF    # GERD  - Pantoprazole 20 mg     # BPH   - continue Flomax      FEN No IVF, NPO, Replace electrolytes.     VTE PPX  on Heparin gtt     Dispo  - Admit to the floor       Code Status: Full Code    Total Time Today was 75 minutes in the following activities: Preparing to see the patient, Obtaining and/or reviewing separately obtained history, Performing a medically appropriate examination and/or evaluation, Counseling and educating the patient/family/caregiver, Ordering medications, tests, or procedures, Documenting clinical information in the electronic or other health record, Independently interpreting results (not separately reported) and communicating results to the patient/family/caregiver and Care coordination (not separately reported)        Preston Fleeting, MD  Internal Medicine/Nocturnist  8 PM - 8 AM  Pager # [phone]    Note: For any questions/concerns after 8 AM; please contact the assigned team. Amie Critchley is the preferred way of communication.     This note  was created using  [employer], hence  some grammatical errors may still be present despite  editing at the time of the dictation    Subjective:                       Primary Care Physician: Barton Dubois      Chief Complaint:  Chief Complaint   Patient presents with    Altered mental status     Had fever last night. Per daughter pt locked himself in his room. They had to break down door and found him on the floor. Was recently around daughter who has the flu HPI: Kevin Obrien is a 72 y.o. male with PMH of CAD, Diabetes mellitus type II, HTN, HLD and PVD who presents to the ER with AMS.     History is limited due to AMS and mainly obtained from family and Chart review. Patient has been confused over the last three days. Patient also reports nasal congestion, cough and subjective fever. . Patient's family reports the patient was recently around daughter who has the flu. Patient denies chest pain, SOB, Abdominal pain, LE edema, seizure like activity.     Patient's family reports that patient was found on ground today around 1000 at bedroom. Patient's family reports they believe the patient had fallen into a table near his bed. Patient's family reports the patient has been experiencing confusion, frontal HA and weakness since finding him. Patient denies remembering a fall. Patient does however endorse hitting his head at an unspecified time today. Patient's family report his last known normal mental state was last night (06/05/23) at approximately 2200.     Past Medical History:    Accidental fall    Angina pectoris (HCC)    Arthritis    BPH (benign prostatic hyperplasia)    Cataract    Constipation    Coronary artery disease    DM (diabetes mellitus) (HCC)    ED (erectile dysfunction)    Heart attack (HCC)    Hyperlipidemia    Hypertension    Joint pain    Neuropathy    Shingles        Surgical History:   Procedure Laterality Date    CORONARY STENT PLACEMENT  2016    HX CHOLECYSTECTOMY  09/2015    COLONOSCOPY N/A 04/20/2016    Performed by Vertell Novak, MD at [facility] ENDO    ESOPHAGOGASTRODUODENOSCOPY N/A 04/20/2016    Performed by Vertell Novak, MD at [facility] ENDO    ESOPHAGOGASTRODUODENOSCOPY BIOPSY  04/20/2016    Performed by Vertell Novak, MD at [facility] ENDO    UREA BREATH  TEST N/A 11/13/2016    Performed by Jolee Ewing, MD at [facility] ENDO     Family History   Problem Relation Name Age of Onset    Diabetes Mother AMPARO CUBIAS     Diabetes Father JOSE Dewalt Cancer-Lung Father JOSE Sobh     Melanoma Neg Hx      Cataract Neg Hx      Glaucoma Neg Hx      Macular Degen Neg Hx              Social History     Socioeconomic History    Marital status: Separated    Number of children: 3   Tobacco Use    Smoking status: Former     Current packs/day: 0.00     Average packs/day: 2.0 packs/day for 10.0 years (20.0 ttl pk-yrs)     Types: Cigarettes     Start date: 04/11/1961     Quit date: 04/12/1971     Years since quitting: 52.1    Smokeless tobacco: Never    Tobacco comments:     Former smoker/quit years ago   Psychologist, educational Use    Vaping status: Never Used   Substance and Sexual Activity    Alcohol use: Not Currently    Drug use: Never    Sexual activity: Not Currently     Partners: Female     Birth control/protection: None          Allergies   Allergen Reactions    Seasonal Allergies RHINITIS        Review of Systems  Review of symptoms was performed and is negative except as mentioned in the HPI above.    Objective:      Vital Signs: Last Filed In 24 Hours Vital Signs: 24 Hour Range   BP: 124/62 (12/25 2333)  Temp: 37.1 ?C (98.8 ?F) (12/25 1610)  Pulse: 88 (12/25 2333)  Respirations: 17 PER MINUTE (12/25 2333)  SpO2: 96 % (12/25 2333)  O2 Device: None (Room air) (12/25 1939)  Height: 165.1 cm (5' 5) (12/25 2333) BP: (121-202)/(62-96)   Temp:  [37.1 ?C (98.8 ?F)-38.1 ?C (100.6 ?F)]   Pulse:  [88-115]   Respirations:  [14 PER MINUTE-25 PER MINUTE]   SpO2:  [94 %-98 %]   O2 Device: None (Room air)     PHYSICAL EXAMINATION:    GENERAL APPEARANCE: Alert and Oriented to himself. Appeared to be in no acute distress.  HEAD: Normocephalic, Atraumatic  EYES: PERRL, EOMI.   EARS: External auditory canals and tympanic membranes clear, hearing grossly appears to normal  NOSE: No nasal discharge.  THROAT: No inflammation, swelling, exudate, or lesions. Teeth and gingiva in good general condition.  NECK: Neck supple, non-tender, No JVD. No lymphadenopathy, masses or thyromegaly.  CARDIAC: Regular Rate and Rhythm. No Murmurs. There is no pedal edema. Extremities are warm.   LUNGS: Clear to auscultation. No rales, rhonchi or wheezing.  ABDOMEN: Soft, nondistended, nontender. No guarding or rebound.   EXTREMITIES: No significant deformity or obvious joint abnormality.   MUSKULOSKELETAL: Adequately aligned spine. ROM intact in spine and extremities. No joint erythema or tenderness. Normal muscular development.  NEUROLOGICAL: CN III-XII grossly intact. Strength and sensation grossly symmetric and intact throughout. Reflexes 2+ throughout. Cerebellar testing wasn't clinically significant.  SKIN: No lesions or eruptions.  PSYCHIATRIC: The patient was able to demonstrate good judgement and reason, without hallucinations, abnormal affect or abnormal behaviors during the examination. Patient is not suicidal or homicidal.  LABS:  Recent Labs     06/05/23  2009   NA 133*   K 3.9   CL 96*   CO2 22   GAP 15*   BUN 15   CR 0.96   GLU 366*   CA 9.3   ALBUMIN 4.3   TSH 0.40       Recent Labs     06/05/23  2009 06/05/23  2345   WBC 8.80  --    HGB 16.5  --    HCT 45.9  --    PLTCT 154  --    PT 13.2*  --    INR 1.2  --    PTT  --  28.8   AST 28  --    ALT 22  --    ALKPHOS 58  --       Estimated Creatinine Clearance: 60.2 mL/min (based on SCr of 0.96 mg/dL).  Vitals:    06/05/23 1939 06/05/23 2333   Weight: 49.7 kg (109 lb 9.6 oz) 63 kg (138 lb 14.2 oz)    No results for input(s): PHART, PO2ART in the last 72 hours.    Invalid input(s): PC02A                       MEDSamLODIPine, 10 mg, Oral, QDAY  aspirin, 81 mg, Oral, QDAY  atorvastatin, 40 mg, Oral, QDAY  carvediloL, 3.125 mg, Oral, BID  gabapentin, 600 mg, Oral, TID  insulin aspart (NOVOLOG) injection, 0-6 Units, Subcutaneous, ACHS (22)  insulin aspart (U-100), 18 Units, Subcutaneous, TID w/ meals  insulin glargine (LANTUS) injection, 42 Units, Subcutaneous, QHS(22)  lisinopriL, 40 mg, Oral, QDAY  mirabegron, 50 mg, Oral, QDAY  oseltamivir, 75 mg, Oral, BID  pantoprazole DR, 20 mg, Oral, QDAY(21)  tamsulosin, 0.4 mg, Oral, QDAY     IV MEDS   heparin (porcine) 20,000 units/D5W 500 mL infusion (std conc)(premade) 756 Units/hr (06/06/23 0047)     Prn capsaicin TID PRN, dextrose 50% (D50) IV PRN, heparin (porcine) Q6H PRN, melatonin QHS PRN, ondansetron Q6H PRN **OR** ondansetron (ZOFRAN) IV Q6H PRN, polyethylene glycol 3350 QDAY PRN, sennosides-docusate sodium QDAY PRN     HOME MEDS   No current facility-administered medications on file prior to encounter.     Current Outpatient Medications on File Prior to Encounter   Medication Sig Dispense Refill    amLODIPine (NORVASC) 10 mg tablet Take 1 tablet by mouth daily. (Patient taking differently: Take one-half tablet by mouth daily.) 90 tablet 3    aspirin 81 mg chewable tablet Chew one tablet by mouth daily.        atorvastatin (LIPITOR) 40 mg tablet Take one tablet by mouth daily.      capsaicin 0.025 % topical cream Apply on the affected area for neuropathic pain.  Wash hands after application 21 g 3    carvediloL (COREG) 3.125 mg tablet carvedilol 3.125 mg tablet Take 1 tablet twice a day by oral route.      cetirizine (ZYRTEC) 10 mg tablet Take one tablet by mouth daily. 30 tablet 0    flash glucose scanning reader (FREESTYLE LIBRE 2 READER) misc Use as directed. 1 each 0    flash glucose sensor (FREESTYLE LIBRE 2 SENSOR) kit Use as directed. 2 kit 3    gabapentin (NEURONTIN) 300 mg capsule Take two capsules by mouth three times daily. 90 capsule 5    glucose 4 gram chewable tablet Chew 4 tablets by mouth as  Needed. 50 tablet 3    hydrOXYzine (ATARAX) 25 mg tablet Take one tablet by mouth three times daily as needed for Itching.      ibuprofen (ADVIL) 200 mg tablet Take one tablet by mouth every 6 hours as needed for Pain. Take with food.      insulin lispro (HUMALOG KWIKPEN) 100 unit/mL injection PEN Inject 18 u three times daily with meals 30 mL 1    insulin pen needles (disposable) (B-D NANO; ULTICARE MICRO) 32 gauge x 5/32 pen needle Use one each as directed five times daily. Use with insulin injections. 500 each 3    Ketamine HCl 5%/Baclofen 2%/Clonidine HCl 0.2%/Gabapentin 5%/Amitriptyline HCl 2% in LipoPen Ulra Cream (COMPOUND) Apply topically to the affected area(s) as directed 1-2 times daily as needed for neuropathic pain. 100 g 3    LANTUS SOLOSTAR U-100 INSULIN 100 unit/mL (3 mL) injection PEN Inject forty Units under the skin twice daily. 75 mL 3    LEVEMIR FLEXPEN 100 unit/mL (3 mL) injection pen INJECT 42 units UNDER THE SKIN ONCE DAILY      lisinopril (PRINIVIL, ZESTRIL) 40 mg tablet Take one tablet by mouth daily.      MYRBETRIQ 50 mg tablet Take one tablet by mouth daily. 90 tablet 0    omeprazole DR (PRILOSEC) 20 mg capsule TAKE 1 CAPSULE BY MOUTH ONCE DAILY 30 TO 60 MINUTES BEFORE A MEAL.      oxyCODONE/acetaminophen (PERCOCET) 5/325 mg tablet Take one tablet by mouth at bedtime as needed for Pain 15 tablet 0    peg-electrolyte solution (NULYTELY) 420 gram oral solution The day prior to your procedure: At 11:00 A.M. fill the Nulytely/Golytely jug to the fill line with lukewarm drinking water, cap the jug and shake to dissolve the powder.  Place the jug in the refrigerator. At 5:00 P.M. on the day PRIOR to your procedure: drink one 8-ounce glass of Nulytely/Golytely and repeat every (10-15) minutes until you have finished the first three fourths gallon of Nulytely/Golytely (3) liters. Place remaining liquid in the refrigerator. Day of Procedure: Five hours before your scheduled procedure time drink the remaining Nulytely/Golytely 8-ounces at a time. Repeat every (10-15) minutes until you have finished.  Indications: emptying of the bowel 4000 mL 0    semaglutide (OZEMPIC) 1 mg/dose (2 mg/1.5 mL) injection PEN Inject one mg under the skin every 7 days. 3 mL 3    sildenafiL (VIAGRA) 100 mg tablet TAKE 1 TABLET BY MOUTH AS NEEDED FOR ERECTILE DYSFUNCTION 1 TO 4 HOURS PRIOR TO SEXUAL ACTIVITY 30 tablet 0 tamsulosin (FLOMAX) 0.4 mg capsule Take one capsule by mouth daily. Take 30 min after same meal.  Do not cut/ crush/ chew. 90 capsule 3          CT HEAD WO CONTRAST    Result Date: 06/05/2023  1.  No acute intracranial hemorrhage or mass effect. 2.  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  Finalized by Laqueta Due, M.D. on 06/05/2023 9:04 PM. Dictated by Laqueta Due, M.D. on 06/05/2023 8:58 PM.    CHEST SINGLE VIEW    Result Date: 06/05/2023  Mild bibasilar linear atelectasis or scarring. No consolidation.  Finalized by Laqueta Due, M.D. on 06/05/2023 8:28 PM. Dictated by Laqueta Due, M.D. on 06/05/2023 8:22 PM.

## 2023-06-06 NOTE — Progress Notes
HC5 END OF SHIFT/PLAN OF CARE NURSING NOTE   Nursing Shift: Night Shift 1900-0700    Acute events, nursing interventions, & communication with providers: Pt arrived to the unit at 2333.   Reached out to pharmacy for heparin gtt. Originally scale for ED weight. New weight obtained on floor. Pharmacy changed dose to 63kg.   Pt had insulin ordered at 0215 on a sliding scale. Reached out to [facility] to see which scale he wanted to go by for correction dose. Said to give 07,11,17 dose.   Pt going for MRI and claustrophobic. Preston Fleeting notified and PRN ordered.   MRI had to be pushed to day time due to emergent case.     Patient Goal(s)  Patient will Achieve timely healing; be free of purulent secretions, drainage, or erythema; and be afebrile by the end of next shift.        Patient will  Verbalize readiness for discharge by discharge.   Admission Weight: Weight: 49.7 kg (109 lb 9.6 oz)    Last 3 Weights:   Vitals:    06/05/23 1939 06/05/23 2333   Weight: 49.7 kg (109 lb 9.6 oz) 63 kg (138 lb 14.2 oz)         Intake/Output Summary (Last 24 hours) at 06/06/2023 5284  Last data filed at 06/06/2023 0100  Gross per 24 hour   Intake --   Output 500 ml   Net -500 ml     Last Bowel Movement Date:  (PTA)    Fluid Restriction? No   Quality/Safety    Total Fall Risk Score: 17   Risk for Injury related to falls: Standard risk for injury based on the ABCS scoring tool  Fall Risk Category:   History of More Than One Fall Within 6 Months Before Admission: Yes  Elimination, Bowel and Urine: N/A  Interventions: N/A - Does not score for risk in this category  Medications: On 2 or more high fall risk drugs  Interventions: Use of gait belt , Stay within arm's reach during toileting/showering (i.e., dizziness, orthostasis) , and Bed/chair alarm (i.e., change in mental status)   Patient Care Equipment: One present  Interventions: Needs assistance with patient care equipment when ambulating  Mobility: 2 - Assistance required, 2 - Unsteady gait  Interventions: Assist x1, Gait belt in use when ambulating, and Elimination equipment at bedside (urinal or commode)   Cognition: 0 - No cognition issues  Interventions: N/A - Does not score as risk in this category    Other safety precautions in place: N/A    Restraints:  No      Patient Education  This RN provided education to Patient and Family today. The following education topics were reviewed:  Quality/Safety Education:   Pain scale and Isolation precautions  Medication Education:   Medication management (Indication, adverse effects, monitoring, etc)  Education provided on the following medication(s): Heparin  Cardiac - Specific Education:   Cardiac diagnosis specific education: Heparin  General Education:   Diet/nutrition, Glucose management, and Mobility/Activity intolerance    The following teaching method(s) were used: Verbal  Response to learning: Verbalizes Understanding  Needs reinforcement on:

## 2023-06-06 NOTE — Progress Notes
Internal Medicine Daily Progress Note      Patient's Name:  Kevin Obrien MRN: [id number]   Today's Date:  06/06/2023  Admission Date: 06/05/2023  LOS: 1 day    Problem list  Principal Problem:    AMS (altered mental status)  Active Problems:    Diabetes mellitus (HCC)    Hyperlipidemia    Peripheral artery disease (HCC)    Diabetic polyneuropathy associated with type 2 diabetes mellitus (HCC)    Essential tremor    Underweight      Assessment and Plan:     Sajad Pentz is a 72 y.o. with PMH of CAD, Diabetes mellitus type II, HTN, HLD and PVD who presents to the ER with AMS.     06/06/2023 Updates:  - blood cultures x2 obtained with fever and tachycardia  - pending cardiology recs, heparin stopped  - diabetic education consulted      # AMS   # Fall with LOC  # ? TIA   - Pt presented with acute AMS, slow response and flu like symptoms.   - Patient alert and oriented to himself with no acute focal deficit.   - Stable vitals   - Labs notable for hyperglycemia and lactic acidosis   - CT Head with No acute intracranial hemorrhage or mass effect. 2.  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   Plan:   > Maintain patient on Telemetry   > Lactic acidosis resolved with fluid   > Frequent neuro check    > Avoid sedative medications   > Ordered 2D ECHO   - EF 70%, similar dynamic LVOT seen in 2020, not significantly changed.  > Ordered MRI Head   > ASA and statin        # Influenza A   - Symptomatic with no oxygen requirement  - CXR with Mild bibasilar linear atelectasis or scarring. No consolidation.  Plan:  > Tamiflu x 5 days  > blood cultures obtain with recurrent fever and tachycardia       # Elevated troponin, suspect demand ischemia  # history of NSTEMI,   # CAD status post PCI  # PVC  - Pt endorse some shortness of breath but denies chest pain.   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    Plan:   > Maintain patient on Telemetry   > heparin gtt stopped with discussion with cardiology  > ASA and statin   > Resume Coreg   >  Consult cardiology    - echo as above   - repeat lipids/A1c uncontrolled   - will follow for further recs  > anticipate will need atorvastatin increase      # Type II DM, uncoltrolled  # Peripheral neuropathy   - Continue home insulin regimen and gabapentin   - LDCF  - Hbg A1c 11.9%, family notes inconsistent insulin use  Plan  > diabetic education consult ordered      # HTN  # HLD  - continue Amlodipine, Coreg and lisinopril , atorvastatin       # GERD  -  Pantoprazole 20 mg      # BPH   - continue Flomax        Diet: Diet Diabetic (Standard) Consistent Carb  VTE ppx: Heparin gtt  CODE: Full Code    DISPO: Continue admission to Med 3.    Seen and discussed with cosigned attending physician.    Kevin Roles, MD  Internal Medicine, PGY-1  On [facility] - Pager [phone]      Subjective:     No acute overnight events. Reports sleeping well. Chest pain resolved. Does not notice fever. Mild non-productive cough. No swelling. No dysuria or diarrhea reported. Frequently falls back asleep, reports very tired from last night.    Objective:   Vital Signs:  BP 133/76 (BP Source: Arm, Right Upper)  - Pulse 102  - Temp (!) 39.4 ?C (103 ?F)  - Ht 165.1 cm (5' 5)  - Wt 63 kg (138 lb 14.2 oz)  - SpO2 94%  - BMI 23.11 kg/m?     Intake/Output Summary:  (Last 24 hours)    Intake/Output Summary (Last 24 hours) at 06/06/2023 0817  Last data filed at 06/06/2023 0100  Gross per 24 hour   Intake --   Output 500 ml   Net -500 ml           Gen: AOx2, NAD, fatigued  HEENT: Normocephalic, atraumatic. EOMI, MMM  Resp: CTAB, no accessory muscle use, normal WOB.  CV: RRR, no murmurs, rubs or gallops. Normal pulses  Abd: Soft, non-tender, non-distended, BS+   Ext: no rashes, no edema, no joint swelling  Neuro: no gross focal deficits   Psych: mood stable  Skin: no rashes on exposed skin. Warm and dry.          Labs:  Recent Labs     06/05/23  2009 06/06/23  0336   HGB 16.5 13.7   WBC 8.80 7.70   PLTCT 154 154   NA 133* 133*   K 3.9 3.4*   CL 96* 100   CO2 22 24   BUN 15 20   CR 0.96 1.07   GLU 366* 315*   CA 9.3 8.4*   ALBUMIN 4.3 3.5   AST 28 28   ALT 22 19   ALKPHOS 58 49   TOTBILI 0.7 0.5       Meds:  Scheduled Meds:amLODIPine (NORVASC) tablet 10 mg, 10 mg, Oral, QDAY  aspirin chewable tablet 81 mg, 81 mg, Oral, QDAY  atorvastatin (LIPITOR) tablet 40 mg, 40 mg, Oral, QDAY  carvediloL (COREG) tablet 3.125 mg, 3.125 mg, Oral, BID  gabapentin (NEURONTIN) capsule 600 mg, 600 mg, Oral, TID  insulin aspart (U-100) (NOVOLOG FLEXPEN U-100 INSULIN) injection PEN 0-6 Units, 0-6 Units, Subcutaneous, ACHS (22)  insulin aspart (U-100) (NOVOLOG FLEXPEN U-100 INSULIN) injection PEN 18 Units, 18 Units, Subcutaneous, TID w/ meals  insulin glargine (LANTUS SOLOSTAR U-100 INSULIN) injection PEN 42 Units, 42 Units, Subcutaneous, QHS(22)  lisinopriL (ZESTRIL) tablet 40 mg, 40 mg, Oral, QDAY  mirabegron (MYRBETRIQ) ER tablet 50 mg, 50 mg, Oral, QDAY  oseltamivir (TAMIFLU) capsule 75 mg, 75 mg, Oral, BID  pantoprazole DR (PROTONIX) tablet 20 mg, 20 mg, Oral, QDAY(21)  sodium chloride PF 0.9% injection 10 mL, 10 mL, Intravenous, ONCE  tamsulosin (FLOMAX) capsule 0.4 mg, 0.4 mg, Oral, QDAY    Continuous Infusions:  ? heparin (porcine) 20,000 units/D5W 500 mL infusion (std conc)(premade) 756 Units/hr (06/06/23 0722)     PRN and Respiratory Meds:acetaminophen Q4H PRN, capsaicin TID PRN,  dextrose 50% (D50) IV PRN, heparin (porcine) Q6H PRN, LORazepam  (ATIVAN)  injection Once PRN, melatonin QHS PRN, ondansetron Q6H PRN **OR** ondansetron (ZOFRAN) IV Q6H PRN, perflutren lipid microspheres Once PRN **AND** sodium chloride PF 0.9% ONCE, polyethylene glycol 3350 QDAY PRN, sennosides-docusate sodium QDAY PRN

## 2023-06-06 NOTE — ED Notes
Attempted to call report

## 2023-06-06 NOTE — Case Management (ED)
Case Management Admission Assessment    NAME:Kevin Obrien                          MRN: [id number]             DOB:[birth year]          AGE: 72 y.o.  ADMISSION DATE: 06/05/2023             DAYS ADMITTED: LOS: 1 day      Today?s Date: 06/06/2023    Per emr, Kevin Obrien is a 73 y.o. male with PMH of CAD, Diabetes mellitus type II, HTN, HLD and PVD who presents to the ER with AMS  .  Source of Information: pt niece in the room, [name],  and emr       Plan  Plan: Case Management Assessment, Assist PRN with SW/NCM Services    Spoke to patient niece in the room using [employer] Spanish interpreter [phone] and explained role in r/t d/c planning and provided contact information.  Reviewed Caring [facility], Preparing for Discharge, and Preferred Provider Network hand-outs. Provided opportunity for questions and discussion. Encouraged to contact Case Management team with questions and concerns during hospitalization if any assist is needed.    Pt lives with his wife and 73 y/o dtr in their home in [place]. Pt reported to be independent of all self cares and driving up until Dec 24th this year before he became ill. No dme in the home for ambulation per the niece.   No history of HH or inpt setting in the past.   Plan:pt with ongoing encephalopathy secondary to the flu. Cardiology consulted. Pending d/c home in a few days anticipated.   Case management to continue to follow and assist with d/c needs if indicated. PT and OT have been consulted and will provide d/c recommendations.   Patient Address/Phone  [address]  [place] [address]  [phone] (home)     Emergency Contact  Extended Emergency Contact Information  Primary Emergency Contact: Hyer,Evette  Address: [address]           [place], [place] [address] [place]  Home Phone: [phone]  Mobile Phone: [phone]  Relation: Daughter  Interpreter needed? No  Secondary Emergency Contact: Kathreen [place]  Home Phone: [phone]  Mobile Phone: [phone]  Relation: Daughter  Interpreter needed? No    Forensic scientist: No, patient does not have a healthcare directive  Would patient like to fill out a (a new) Editor, commissioning?: N/A  Psych Advance Directive (Psych unit only): No, patient does not have a Social research officer, government  Does the Patient Need Case Management to Arrange Discharge Transport? (ex: facility, ambulance, wheelchair/stretcher, Medicaid, cab, other): No  Will the Patient Use Family Transport?: Yes  Transportation Name, Phone and Availability #1: no specific family member identified    Expected Discharge Date  06/08/2023     Living Situation Prior to Admission  Living Arrangements  Type of Residence: Home, independent  Living Arrangements: Spouse/significant other, Children  Financial risk analyst / Tub: Tub/Shower Unit  How many levels in the residence?: 1  Can patient live on one level if needed?: Yes  Does residence have entry and/or inside stairs?: Yes (2STE)  Assistance needed prior to admit or anticipated on discharge: No  Who provides assistance or could if needed?: supportive wife  Are they in good  health?: Yes  Can support system provide 24/7 care if needed?: Maybe  Level of Function   Prior level of function: Independent  Cognitive Abilities   Cognitive Abilities: Alert and Oriented, Unable to Assess (pt was asleep and still having some encephalopathy)    Financial Resources  Coverage  Primary Insurance: Clinical cytogeneticist Insurance: Medicaid  Medicaid State: [place]  Additional Coverage: None  Medication Coverage    Medication Coverage: Medicaid  Have you experienced a noticeable increase in your copay costs recently?: No  Are current medications affordable?: Yes  Do You Use a Co-Pay Card or a Medication Assistance Program to Help Manage Medication Costs?: No  Do You Manage Your Own Medications?: Yes  Source of Income   Source Of Income: Other retirement income  Financial Assistance Needed?  No reported concerns for medication or medical care costs    Psychosocial Needs  Mental Health  Mental Health History: No  Substance Use History     Other  na    Current/Previous Services  PCP  Barton Dubois, [phone], [phone]  Pharmacy    [facility] [address] [place], [place] - [address]  [address]  [place] [address]  Phone: [phone] Fax: [phone]    [facility]  [address]  [place] [address]  Phone: [phone] Fax: [phone]    [id number] [place] - [address]  [address] [place] [facility]  [address]  Phone: [phone] Fax: [phone]    Durable Medical Equipment   Durable Medical Equipment at home: None  Home Health     Hemodialysis or Peritoneal Dialysis  Undergoing hemodialysis or peritoneal dialysis: No  Tube/Enteral Feeds  Receive tube/enteral feeds: No  Infusion  Receive infusions: No  Private Duty  Private duty help used: No  Home and Community Based Services  Home and community based services: No  Ryan White  Ryan White: N/A  Hospice  Hospice: No  Outpatient Therapy  PT: No  OT: No  SLP: No  Skilled Nursing Facility/Nursing Home  SNF: No  NH: No  Inpatient Rehab  IPR: No  Long-Term Acute Care Hospital  LTACH: No  Acute Hospital Stay  Acute Hospital Stay: In the past  Was patient's stay within the last 30 days?: No      Gearlean Alf Integrated Nurse Case Manager Bsn Rn-ACM  Ph [phone]  Available on Voalte

## 2023-06-07 LAB — BLOOD GASES, PERIPHERAL VENOUS
~~LOC~~ BKR BASE DEFICIT-VENOUS: 1.9 mmol/L — ABNORMAL HIGH (ref 7–25)
~~LOC~~ BKR BICARB, VENOUS(CAL): 22 mmol/L — ABNORMAL LOW (ref 8.5–10.6)
~~LOC~~ BKR O2 SAT, VENOUS: 85 % — ABNORMAL HIGH (ref 55.0–71.0)
~~LOC~~ BKR PCO2-VENOUS: 38 mmHg (ref 36–50)
~~LOC~~ BKR PH-VENOUS: 7.3 mmol/L — ABNORMAL LOW (ref 7.30–7.40)
~~LOC~~ BKR PO2-VENOUS: 51 mmHg — ABNORMAL HIGH (ref 33–48)

## 2023-06-07 LAB — BASIC METABOLIC PANEL
~~LOC~~ BKR ANION GAP: 11 (ref 3–12)
~~LOC~~ BKR GLOMERULAR FILTRATION RATE (GFR): 49 mL/min — ABNORMAL LOW (ref >60)
~~LOC~~ BKR SODIUM, SERUM: 133 mmol/L — ABNORMAL LOW (ref 137–147)

## 2023-06-07 LAB — LACTIC ACID (BG - RAPID LACTATE): ~~LOC~~ BKR LACTIC ACID(SYRINGE): 1.8 mmol/L (ref 0.5–2.0)

## 2023-06-07 LAB — POC GLUCOSE
~~LOC~~ BKR POC GLUCOSE: 112 mg/dL — ABNORMAL HIGH (ref 70–100)
~~LOC~~ BKR POC GLUCOSE: 134 mg/dL — ABNORMAL HIGH (ref 70–100)
~~LOC~~ BKR POC GLUCOSE: 118 mg/dL — ABNORMAL HIGH (ref 70–100)
~~LOC~~ BKR POC GLUCOSE: 181 mg/dL — ABNORMAL HIGH (ref 70–100)

## 2023-06-07 LAB — PROCALCITONIN: ~~LOC~~ BKR PROCALCITONIN: 0.3 ng/mL

## 2023-06-07 MED ORDER — OSELTAMIVIR 30 MG PO CAP
30 mg | Freq: Two times a day (BID) | ORAL | 0 refills | Status: DC
Start: 2023-06-07 — End: 2023-06-09
  Administered 2023-06-08 – 2023-06-09 (×4): 30 mg via ORAL

## 2023-06-07 MED ORDER — POTASSIUM CHLORIDE IN WATER 10 MEQ/50 ML IV PGBK
10 meq | INTRAVENOUS | 0 refills | Status: CP
Start: 2023-06-07 — End: ?
  Administered 2023-06-07: 22:00:00 10 meq via INTRAVENOUS

## 2023-06-07 MED ORDER — POTASSIUM CHLORIDE IN WATER 10 MEQ/50 ML IV PGBK
10 meq | INTRAVENOUS | 0 refills | Status: CP
Start: 2023-06-07 — End: ?
  Administered 2023-06-07: 21:00:00 10 meq via INTRAVENOUS

## 2023-06-07 MED ORDER — POTASSIUM CHLORIDE IN WATER 10 MEQ/50 ML IV PGBK
10 meq | INTRAVENOUS | 0 refills | Status: CP
Start: 2023-06-07 — End: ?
  Administered 2023-06-07: 12:00:00 10 meq via INTRAVENOUS

## 2023-06-07 MED ORDER — POTASSIUM CHLORIDE IN WATER 10 MEQ/50 ML IV PGBK
10 meq | INTRAVENOUS | 0 refills | Status: CP
Start: 2023-06-07 — End: ?
  Administered 2023-06-07: 14:00:00 10 meq via INTRAVENOUS

## 2023-06-07 MED ORDER — POTASSIUM CHLORIDE IN WATER 10 MEQ/50 ML IV PGBK
10 meq | INTRAVENOUS | 0 refills | Status: CP
Start: 2023-06-07 — End: ?
  Administered 2023-06-07: 19:00:00 10 meq via INTRAVENOUS

## 2023-06-07 MED ORDER — ATORVASTATIN 40 MG PO TAB
80 mg | Freq: Every day | ORAL | 0 refills | Status: DC
Start: 2023-06-07 — End: 2023-06-09
  Administered 2023-06-08 – 2023-06-09 (×2): 80 mg via ORAL

## 2023-06-07 MED ORDER — POTASSIUM CHLORIDE IN WATER 10 MEQ/50 ML IV PGBK
10 meq | INTRAVENOUS | 0 refills | Status: CP
Start: 2023-06-07 — End: ?
  Administered 2023-06-07: 17:00:00 10 meq via INTRAVENOUS

## 2023-06-07 MED ORDER — LACTATED RINGERS IV SOLP
1000 mL | Freq: Once | INTRAVENOUS | 0 refills | Status: CP
Start: 2023-06-07 — End: ?
  Administered 2023-06-07: 06:00:00 1000 mL via INTRAVENOUS

## 2023-06-07 NOTE — Consults
INPATIENT DIABETES EDUCATION TEAM - Nursing Specialty Teams    Diabetes Education Care Coordination:    Voalte message sent to [facility] RN Renae Gloss to inquire about [facility] Deschene's appropriateness for DM education today given ADx and RN end of shift note. (See 06/07/2023 progress note.) RN advises pt is appropriate.    Total Preparation Time (coordination, chart review, & preparing handouts/supplies): 5 minutes      [place] Ana, BSN RN  Nursing Specialty Teams - Diabetes Educator  (Phone) [phone]    8:00-4:30 weekdays -- If no response, please call team pager [phone]).  Diabetes Education Team office [phone])  Available on Voalte

## 2023-06-07 NOTE — Progress Notes
Internal Medicine Daily Progress Note      Patient's Name:  Kevin Obrien MRN: [id number]   Today's Date:  06/07/2023  Admission Date: 06/05/2023  LOS: 2 days    Problem list  Principal Problem:    AMS (altered mental status)  Active Problems:    Diabetes mellitus (HCC)    Hyperlipidemia    Peripheral artery disease (HCC)    Diabetic polyneuropathy associated with type 2 diabetes mellitus (HCC)    Essential tremor    Underweight    Elevated troponin I level    Poorly controlled diabetes mellitus (HCC)    Nephrotic range proteinuria      Assessment and Plan:     Dalyn Burczyk is a 72 y.o. with PMH of CAD, Diabetes mellitus type II, HTN, HLD and PVD who presents to the ER with AMS. Elevated troponin concerning for NSTEMI and influenza positive. Cardiology consulted who determined most likely demand ischemia with no further inpatient workup necessary. Patient admitted to medicine for influenza and altered mental status treatment.    06/07/2023 Updates:  - fever and hypotension overnight, improved with fluids and began empiric CAP antibiotics  - MRI head: no recent infarct, several chronic infarcts noted      # AMS   # Fall with LOC  # ? TIA   - Pt presented with acute AMS, slow response and flu like symptoms.   - Patient alert and oriented to himself with no acute focal deficit.   - Stable vitals   - Labs notable for hyperglycemia and lactic acidosis   - CT Head with No acute intracranial hemorrhage or mass effect. 2.  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   Plan:   > Maintain patient on Telemetry   > Avoid sedative medications   > Ordered 2D ECHO   - EF 70%, similar dynamic LVOT seen in 2020, not significantly changed.  > Ordered MRI Head - chronic infarcts, none acute.  > ASA and statin        # Influenza A   # Empiric community acquired pneumonia coverage  - Symptomatic with no oxygen requirement  - CXR with Mild bibasilar linear atelectasis or scarring. No consolidation.  Plan:  > Tamiflu x 5 days (12/25 - present)  > blood cultures obtain with recurrent fever and tachycardia   - began antibiotics given hypotension and fever   IV ceftriaxone (12/26 - present)   IV doxycycline (12/26 - present)   > improvement with 1 liter IV fluids      # Elevated troponin, suspect demand ischemia  # history of NSTEMI,   # CAD status post PCI  # PVC  - Pt endorse some shortness of breath but denies chest pain.   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    Plan:   > Maintain patient on Telemetry   > heparin gtt stopped with discussion with cardiology  > ASA and statin   > Resume Coreg   >  Consult cardiology    - echo as above   - no further ischemic workup  needed   - recommend outpatient follow up with established cardiologist Dr. Magnus Ivan  > will increase atorvastatin to 80 mg with elevated LDL, cardiac and stroke history      # Type II DM, uncoltrolled  # Peripheral neuropathy   - Continue home insulin regimen and gabapentin   - LDCF  - Hbg A1c 11.9%, family notes inconsistent insulin use  Plan  > diabetic education consult ordered, pending      # HTN  # HLD  - continue Amlodipine, Coreg, atorvastatin  > holding lisinopril in setting of overnight hypotension and possible LVOT on echo       # GERD  - Pantoprazole 20 mg      # BPH   - continue Flomax        Diet: Diet Diabetic (Standard) Consistent Carb  VTE ppx: Heparin gtt  CODE: Full Code    DISPO: Continue admission to Med 3.    Seen and discussed with cosigned attending physician.    [name], MD  Internal Medicine, PGY-1  On [facility] - Pager [phone]      Subjective:     No acute overnight events. Reports sleeping well. Chest pain resolved. Does not notice fever. Mild non-productive cough. No swelling. No dysuria or diarrhea reported. Frequently falls back asleep, reports very tired from last night.    Objective:   Vital Signs:  BP 120/57 (BP Source: Arm, Left Upper)  - Pulse 78  - Temp 36.9 ?C (98.4 ?F)  - Ht 165.1 cm (5' 5)  - Wt 62.6 kg (138 lb)  - SpO2 97%  - BMI 22.96 kg/m?     Intake/Output Summary:  (Last 24 hours)    Intake/Output Summary (Last 24 hours) at 06/07/2023 1304  Last data filed at 06/07/2023 1250  Gross per 24 hour   Intake 2480 ml   Output 1075 ml   Net 1405 ml           Gen: AOx3, NAD, fatigued  HEENT: Normocephalic, atraumatic. EOMI, MMM  Resp: CTAB, no accessory muscle use, normal WOB.  CV: RRR, no murmurs, rubs or gallops. Normal pulses  Abd: Soft, non-tender, non-distended, BS+   Ext: no rashes, no edema, no joint swelling  Neuro: no gross focal deficits , deconditioned  Psych: mood stable  Skin: no rashes on exposed skin. Warm and dry.          Labs:  Recent Labs     06/05/23  2009 06/06/23  0336 06/06/23  2226 06/07/23  0340   HGB 16.5 13.7  --  13.2*   WBC 8.80 7.70  --  7.70   PLTCT 154 154  --  137*   NA 133* 133* 133* 136*   K 3.9 3.4* 3.3* 3.1*   CL 96* 100 100 103   CO2 22 24 22 24    BUN 15 20 32* 31*   CR 0.96 1.07 1.49* 1.11   GLU 366* 315* 241* 112*   CA 9.3 8.4* 8.3* 8.4*   ALBUMIN 4.3 3.5  --  3.2*   AST 28 28  --  38   ALT 22 19  --  20   ALKPHOS 58 49  --  43   TOTBILI 0.7 0.5  --  0.3       Meds:  Scheduled Meds:amLODIPine (NORVASC) tablet 10 mg, 10 mg, Oral, QDAY  aspirin chewable tablet 81 mg, 81 mg, Oral, QDAY  [START ON 06/08/2023] atorvastatin (LIPITOR) tablet 80 mg, 80  mg, Oral, QDAY  carvediloL (COREG) tablet 3.125 mg, 3.125 mg, Oral, BID  cefTRIAXone (ROCEPHIN) IVP 2 g, 2 g, Intravenous, Q24H*  doxycycline (VIBRAMYCIN) 100 mg in sodium chloride 0.9% (NS) 100 mL IVPB (MB+), 100 mg, Intravenous, Q12H*  enoxaparin (LOVENOX) syringe 40 mg, 40 mg, Subcutaneous, QDAY(21)  [Held by Provider] gabapentin (NEURONTIN) capsule 600 mg, 600 mg, Oral, TID  insulin aspart (U-100) (NOVOLOG FLEXPEN U-100 INSULIN) injection PEN 0-6 Units, 0-6 Units, Subcutaneous, ACHS (22)  [Held by Provider] insulin aspart (U-100) (NOVOLOG FLEXPEN U-100 INSULIN) injection PEN 18 Units, 18 Units, Subcutaneous, TID w/ meals  insulin glargine (LANTUS SOLOSTAR U-100 INSULIN) injection PEN 42 Units, 42 Units, Subcutaneous, QHS(22)  [Held by Provider] lisinopriL (ZESTRIL) tablet 40 mg, 40 mg, Oral, QDAY  mirabegron (MYRBETRIQ) ER tablet 50 mg, 50 mg, Oral, QDAY  oseltamivir (TAMIFLU) capsule 30 mg, 30 mg, Oral, BID  pantoprazole DR (PROTONIX) tablet 20 mg, 20 mg, Oral, QDAY(21)  potassium chloride in water IVPB 10 mEq, 10 mEq, Intravenous, Q2H*   Followed by  potassium chloride in water IVPB 10 mEq, 10 mEq, Intravenous, Q2H*   Followed by  potassium chloride in water IVPB 10 mEq, 10 mEq, Intravenous, Q2H*  sodium chloride 0.9 %   infusion, 500 mL, Intravenous, ONCE  tamsulosin (FLOMAX) capsule 0.4 mg, 0.4 mg, Oral, QDAY    Continuous Infusions:      PRN and Respiratory Meds:acetaminophen Q4H PRN, capsaicin TID PRN, dextrose 50% (D50) IV PRN, melatonin QHS PRN, ondansetron Q6H PRN **OR** ondansetron (ZOFRAN) IV Q6H PRN, polyethylene glycol 3350 QDAY PRN, sennosides-docusate sodium QDAY PRN

## 2023-06-07 NOTE — Progress Notes
Brief Note:  Patient's pressures dropped overnight w/ MAPs in the low 60s.  Tachycardic  in the 90s-100s.  Was fevering considerably in the setting of influenza infection.      Dr. Gracelyn Nurse appropriately gave 1 L of LR and held the lisinopril.      Review of the chart shows the patient has a dynamic LVOT-O, possibly in setting of acute illness.  Would ensure adequate pre-load and avoid significant afterload reduction (such as ACE-I, ARBs).  Would also minimize tachycardia if possible as well.    BP improved shortly after aforementioned fluid resuscitation.     Thereasa Solo, MD  PGY-3, Internal Medicine

## 2023-06-07 NOTE — Progress Notes
OCCUPATIONAL THERAPY  ASSESSMENT NOTE      Name: Kevin Obrien   MRN: [id number]     DOB: Jun 24, 1950      Age: 72 y.o.  Admission Date: 06/05/2023     LOS: 2 days     Date of Service: 06/07/2023      Mobility  Patient Turn/Position: Chair  Progressive Mobility Level: Walk in room  Distance Walked (feet): 10 ft  Level of Assistance: Assist X2  Assistive Device: Walker  Activity Limited By: Weakness;Other (Comment);Mental Status Variability (impaired balance)    Subjective  Significant Hospital Events: PMH of CAD, Diabetes mellitus type II, HTN, HLD and PVD who presents to the ER with AMS.  Mental / Cognitive: Alert;Oriented;Cooperative;Inconsistent with command following  Pain: No complaint of pain  Pain Interventions: Patient assisted into position of comfort  Persons Present: Family;Rehabilitation technician;Nursing Staff (pt's wife and daughter present at start of session)  Comments: On entry, pt in bed. On exit, pt in bedside chair w/ alarm on and all needs within reach. RN notified.    Home Living Situation  Lives With: Family (wife and 42 y.o. daughter)  Type of Home: House  Entry Stairs: 2;Rail on both sides  In-Home Stairs: Able to live on main level  Bathroom Setup: Tub/shower unit  Patient Owned Equipment: None  Comments: Pt denies prior use of DME.    Prior Level of Function  Level Of Independence: Independent with ADL and community mobility without device;Assist needed for IADL  Comments: Pt reports independence with ADLs and IADLs with increased time. Pt's wife reports providing occasional assistance w/ dressing ADL.  Required Assist For: Dressing  History of Falls in Past 3 Months: Yes  Comments: x1 fall prior to admission    Vision  Corrective Lenses: Wears glasses for reading    ADL's  Where Assessed: Supine, Bed  LE Dressing Assist: Total Assist  LE Dressing Deficits: Don/Doff R Sock;Don/Doff L Sock    ADL Mobility  Bed Mobility: Supine to Sit: Moderate assist (HOB elevated)  Bed Mobility Comments: Use of bedrail.  Transfer Type: Sit to stand  Transfer: Assistance Level: From;Bed;Minimal assist;x2 people;To/from;Bedside chair;Moderate assist  Transfer: Assistive Device: Hand hold assist  Transfer: Type of Assistance: Elevated bed;For balance;For safety considerations;For strength deficit  End of Activity Status: Up in chair;Instructed patient to request assist with mobility;Instructed patient to use call light;Nursing notified  Transfer Comments: Completes first STS from bed w/ min HHA x2. Completes STS from bedside chair w/ mod A. Pt stands w/ anteriorly lean and trunk flexed forward. Requires verbal and tactile cues to correct posture; pt demonstrates inconsistent carryover.  Sitting Balance: Static sitting balance;2 UE support;Standby assist  Standing Balance: Static standing balance;Minimal assist  Gait Distance: 10 feet  Gait: Assistance Level: Moderate assist;of 1st person;Standby assist;of 2nd person;Management of lines;Safety considerations  Gait: Assistive Device: Roller walker  Gait Comments: Pt ambulates ~35ft in room w/ RW, mod A 1st person, and SBA 2nd person for line management. Pt pushes RW outside of BOS and requires significant physical assistance to safely navigate RW. Further ambulation deferred for safety.    Activity Tolerance  Endurance: 3/5 Tolerates 25-30 Minutes Exercise w/Multiple Rests    Cognition  Overall Cognitive Status: Impaired  Social Interaction:  (emotionally labile)  Orientation: Alert & Oriented x4  Attention: Awake/Alert  Cognition Comment: Pt intermittently tearful throughout session. Demonstrates difficulty command following this date.    ROM  R UE ROM: Not WFL   R UE ROM  Method: Active;Passive  R Shoulder Flexion  (0-180): 90  L UE ROM: WFL   L UE ROM Method: Active  Coordination: Adequate to Complete ADLs  Grasp: Bilateral Grasp Functional for Activity  ROM Comments: Pt reports hx of R shoulder injury resulting in limited RUE AROM and PROM at baseline.    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    Education  Persons Educated: Patient  Barriers To Learning: Cognitive Deficits  Interventions: Repetition of Instructions;Physical Cueing  Teaching Methods: Verbal Instruction;Demonstration  Patient Response: Verbalized Understanding;More Instruction Required  Topics: Role of OT, Goals for Therapy  Goal Formulation: With Patient  Comments: Pt educated on proper and safe use of RW during functional mobility; demonstrates poor carryover.    Assessment  Assessment: Decreased ADL Status;Decreased UE ROM;Decreased Safe/Judg during ADL;Decreased Cognition;Decreased Endurance;Decreased Sensation;Decreased Self-Care Trans;Decreased High-Level ADLs  Prognosis: Good;w/Cont OT s/p Acute Discharge  Goal Formulation: Patient  Comments: Pt is currently limited by impaired cognition, generalized weakness, impaired balance, and decreased activity tolerance. OT will continue to follow to maximize independence and safety with ADLs and functional mobility throughout acute admission.     AM-PAC 6 Clicks Daily Activity Inpatient  Putting on and taking off regular lower body clothes: A Lot  Bathing (Including washing, rinsing, drying): A Lot  Toileting, which includes using toilet, bedpan, or urinal: A Lot  Putting on and taking off regular upper body clothing: A Little  Taking care of personal grooming such as brushing teeth: None  Eating meals: None  Daily Activity Raw Score: 17  Standardized (T-scale) Score: 37.26    Plan  OT Frequency: 3-5x/week  OT Plan for Next Visit: ADLs at sink w/ chair; LB dressing; toileting BSC vs bathroom; increase endurance    ADL Goals  Patient Will Perform Grooming: Standing at Sink;w/ Minimum Assist  Patient Will Perform Toileting: w/ Moderate Assist    Functional Transfer Goals  Pt Will Perform All Functional Transfers: Minimum Assist    OT Discharge Recommendations  Recommendation: Inpatient setting  Patient Currently Requires Physical Assist With: All mobility;All personal care ADLs;All home functioning ADLs  Patient Currently Requires Supervision For: ADLs;Mobility    Therapist: Burnadette Pop, OTD, OTR/L  Date: 06/07/2023

## 2023-06-07 NOTE — Consults
INPATIENT DIABETES EDUCATION TEAM - Nursing Specialty Teams    Reason for Consult: Uncontrolled Hyperglycemia; Unsure if patient is properly administering insulin    Discussed Consult with Primary Team: [facility] RN Renae Gloss; Dr. Delon [place], Med 3 [facility]    Patient may benefit from:  Rx for below medications and supplies  Simple basal + bolus (no CF) MDI regimen at d/c  CGM Rx from outpatient DM provider    This consult team does not write orders. Primary Team is responsible for placing orders.  PLEASE USE THE AMB ADULT DIABETES INSULIN AND SUPPLIES.  For questions regarding insurance coverage please contact Case Management.  Please fill per formulary if below recommended glucometer supplies are not covered by insurance.    ICD-10 Coding for supplies requires appropriate coding to get enough supplies. Needles and lancets will ask you for a diagnosis to identify the patient.     GLUCOMETER / STRIPS / LANCETS  Generic Meter (per insurance)  Blood glucose meter kit  Blood sugar diagnostic test strips  Lancets    INSULIN: PENS & NEEDLES  INSULIN PENS: BASAL / LONG ACTING  insulin glargine (LANTUS SOLOSTAR U-100 INSULIN) 100 units/mL (3 mL) injection PEN    INSULIN PENS: RAPID ACTING / PRANDIAL  insulin aspart U-100 (NOVOLOG FLEXPEN U-100 INSULIN) 100 unit/mL injection PEN    PEN NEEDLES  Insulin pen needles (disposable) (BD NANO 2nd GEN PEN NEEDLE) 32 gauge x 5/32 pen needle    HYPOGLYCEMIA TREATMENT  Glucose (DEX4 GLUCOSE) 4 gram chewable tablet    Gvoke hypopen (single response)   GVOKE HYPOPEN >= 12 years or >= 100 lbs (45.5kg) (Single response)  Glucagon (GVOKE HYPOPEN 2-pack) 1 mg/0.2 mL syringe      Supplies/Resources provided:  Handouts -- DM Summary Sheet, A1c, Hyper- and Hypoglycemia, Hypoglycemia Tx Options, Insulin Pen Use and Storage, Gvoke User Guide      Met with Acie Fredrickson to discuss diabetes management. Pt admitted for AMS (altered mental status). Pt has a past medical history of Accidental fall, Angina pectoris (HCC), Arthritis, BPH (benign prostatic hyperplasia), Cataract, Constipation, Coronary artery disease, DM (diabetes mellitus) (HCC), ED (erectile dysfunction), Heart attack (HCC), Hyperlipidemia, Hypertension, Joint pain, Neuropathy, and Shingles.    Pt is a pleasant man whose family was at bedside. Family interpreted at times throughout conversation, as pt sometimes didn't follow conversation in [place]. It isn't known if this is d/t pt's admit diagnosis or if pt prefers [place]. Asked family if education materials were needed in [place]. They indicate English education materials are fine but Spanish education materials would be fine, too.    Voalte message sent to Dr. Orson Ape, Med 3 [facility], to provide update on pt's (current) limitations for understanding CF bolus dosing and recommend simple insulin regimen at d/c.      PTA medications  Pt reports taking Lantus 42 units BID.    Monitoring  Pt doesn't check blood glucose levels.  Pt states they have a glucometer but no supplies at home.  Education provided:   Discussed goals of 80-130 prior to meals, or up to 180 two hours after.  Encouraged pt to record results in log book and to take meter and log book to provider visits.    Hypoglycemia  Education provided:  Discussed S/sx, definition, prevention, and treatment: Reviewed Rule of 15.    Hyperglycemia  Education provided:   Discussed S/sx, definition, prevention, and treatment.  Reviewed meaning of HbA1c, current A1c 11.9%, and goal of < 7%.  Reviewed long term complications.    Follow up  Pt sees PCP for diabetes management.  Instructed pt to call their diabetes provider for glucose consistently above 250 or below 70 after treatment.    New insulin regimen teaching  Discussed the action times and when to take basal and bolus insulin. Reviewed how to use a CF chart. Pt was unable to demonstrate understanding and would benefit from simple basal + bolus schedule at d/c.  Entered Nursing Communication order to have pt practice insulin administration in abdomen to develop comfort with technique while in hospital to include: screwing on the insulin pen needle, dialing up 2 units to prime pen, dialing correct number of units of insulin, inserting needle straight into injection site/inject insulin and waiting for 10 seconds. Document on MAR under action tab as Pt/family admin.      Total Time Spent with Patient: 45 minutes  Total Preparation Time (coordination, chart review, & preparing handouts/supplies): 30 minutes      Appreciate the consult. Please contact diabetes educators with any additional questions or concerns.    French Ana, BSN RN  Nursing Specialty Teams - Diabetes Educator  (Phone) [phone]  Available on Voalte Me    8:00am-4:30pm - weekdays, if no response, please call team pager [phone])

## 2023-06-07 NOTE — Progress Notes
HC5 END OF SHIFT/PLAN OF CARE NURSING NOTE   Nursing Shift: Night Shift 1900-0700    Acute events, nursing interventions, & communication with providers: 2100 pt lethargic not waking up to take pills. Was barely opening eyes. BP 100/45. Reached out to Nicholes Rough about pt not taking medication. Provider said to hold medications that were not IV form.   ~2200 Nicholes Rough to bedside. BMP and venous blood gas ordered.   2350 pt went down to MRI of head. Nicholes Rough held ativan for claustrophobia due to pts lethargic state. MRI able to get scan.    ~0000 Adam weir Increased LR due to LVOT and aki   0330: Pt not peeing. Bladder scan showed . Nicholes Rough notified and straight cath order placed. 1075 mL drained.   0518: Potassium 3.1- Nicholes Rough notified and replacement ordered    Patient Goal(s)  Patient will Show diminished episodes of delirium by the end of next shift.        Patient will  Verbalize readiness for discharge by discharge.   Admission Weight: Weight: 49.7 kg (109 lb 9.6 oz)    Last 3 Weights:   Vitals:    06/05/23 1939 06/05/23 2333 06/06/23 0744   Weight: 49.7 kg (109 lb 9.6 oz) 63 kg (138 lb 14.2 oz) 62.6 kg (138 lb)         Intake/Output Summary (Last 24 hours) at 06/07/2023 0653  Last data filed at 06/07/2023 0430  Gross per 24 hour   Intake 2183.01 ml   Output 1325 ml   Net 858.01 ml     Last Bowel Movement Date: 06/06/23    Fluid Restriction? No   Quality/Safety    Total Fall Risk Score: 23   Risk for Injury related to falls: Standard risk for injury based on the ABCS scoring tool  Fall Risk Category:   History of More Than One Fall Within 6 Months Before Admission: Yes  Elimination, Bowel and Urine: Incontinence OR urgency OR frequency  Interventions: Commode at bedside, Place male/male urinal within reach , and Bed pan available in room  Medications: On 2 or more high fall risk drugs  Interventions: Bedside commode (i.e., urgency, frequency, dizziness) , Use of gait belt , Stay within arm's reach during toileting/showering (i.e., dizziness, orthostasis) , and Bed/chair alarm (i.e., change in mental status)   Patient Care Equipment: One present  Interventions: Needs assistance with patient care equipment when ambulating and Ensure environment is free of clutter and walkways are clear from tripping hazards  Mobility: 2 - Assistance required, 2 - Unsteady gait  Interventions: Assist x2, Gait belt in use when ambulating, and Elimination equipment at bedside (urinal or commode)   Cognition: 4 - Lack of understanding on one's physical and cognitive limitations  Interventions: Bed/Chair Alarm  and Stay within arm's reach while patient ambulating/toileting/showering    Other safety precautions in place: HAPI Prevention Bundle    Restraints:  No      Patient Education  This RN provided education to Patient and Family today. The following education topics were reviewed:  Quality/Safety Education:   Fall risk and VTE prophylaxis  Medication Education:   Medication management (Indication, adverse effects, monitoring, etc)  Education provided on the following medication(s): LR  Cardiac - Specific Education:   Cardiac diet/nutrition  General Education:   Bowel/Urinary elimination and Safe use of medical device/equipment: Straight Cath    The following teaching method(s) were used: Verbal  Response to learning: Some Evidence of Learning,  Needs Reinforcement  Needs reinforcement on: Fall Risk.

## 2023-06-08 LAB — POC GLUCOSE
~~LOC~~ BKR POC GLUCOSE: 209 mg/dL — ABNORMAL HIGH (ref 70–100)
~~LOC~~ BKR POC GLUCOSE: 130 mg/dL — ABNORMAL HIGH (ref 70–100)
~~LOC~~ BKR POC GLUCOSE: 233 mg/dL — ABNORMAL HIGH (ref 70–100)
~~LOC~~ BKR POC GLUCOSE: 82 mg/dL (ref 70–100)

## 2023-06-08 MED ORDER — DOXYCYCLINE 100 MG/100 ML IVPB (MB+)
100 mg | Freq: Two times a day (BID) | INTRAVENOUS | 0 refills | Status: DC
Start: 2023-06-08 — End: 2023-06-09
  Administered 2023-06-08 – 2023-06-09 (×6): 100 mg via INTRAVENOUS

## 2023-06-08 MED ORDER — CEFTRIAXONE INJ 2GM IVP
2 g | INTRAVENOUS | 0 refills | Status: DC
Start: 2023-06-08 — End: 2023-06-09
  Administered 2023-06-09: 03:00:00 2 g via INTRAVENOUS

## 2023-06-08 NOTE — Progress Notes
PHYSICAL THERAPY  ASSESSMENT      Name: Kevin Obrien   MRN: [id number]     DOB: [birth year]      Age: 72 y.o.  Admission Date: 06/05/2023     LOS: 3 days     Date of Service: 06/08/2023    Mobility  Patient Turn/Position: Supine  Progressive Mobility Level: Walk in hallway  Distance Walked (feet): 350 ft  Level of Assistance: Assist X1  Assistive Device: Walker  Activity Limited By: No limitations    Subjective  Reason for Admission and Past Medical Hx: 72 y.o. with PMH of CAD, Diabetes mellitus type II, HTN, HLD and PVD who presents to the ER with AMS. Elevated troponin concerning for NSTEMI and influenza positive. Cardiology consulted who determined most likely demand ischemia with no further inpatient workup necessary. Patient admitted to medicine for influenza and altered mental status treatment.  Mental / Cognitive: Alert;Oriented;Cooperative;Follows commands  Pain: No complaint of pain  Pain Interventions: Patient agrees to participate in therapy with current pain level;Patient assisted into position of comfort  Persons Present: Family    Home Living Situation  Lives With: Family (Wife and 11 y.o. daughter)  Type of Home: House  Entry Stairs: 2;Rail on both sides  In-Home Stairs: No stairs  Patient Owned Equipment: None    Prior Level of Function  Level Of Independence: Independent with ADL and community mobility without device;Assist needed for IADL  Comments: Patient and family members report patient is typically independent with all ADLs and mobility at baseline.  History of Falls in Past 3 Months: Yes  Comments: x1 fall prior to admission  Occupation/Education: Employed (Owns cleaning business)    ROM  ROM Position Assessed: Supine;Seated  R LE ROM: WFL  R LE ROM Method: Active  L LE ROM: WFL  L LE ROM Method: Active    Strength  Strength Position Assessed: Supine;Seated  Overall Strength: WFL;No focal deficits noted    Posture/Neurological  Head Control: Independent    Bed Mobility/Transfer  Bed Mobility: Supine to Sit: Standby Assist;Head of Bed Elevated;Use of Rail;Requires Extra Time;Safety Considerations  Bed Mobility: Sit to Supine: Standby Assist;Bed Flat;No Rail;Requires Extra Time;Safety Considerations  Comments: Denies dizziness or lightheadedness upon transitioning to upright position. Patient dons shirt and shoes independently while sitting EOB.  Transfer Type: Sit to/from Stand  Transfer: Assistance Level: To/From;Bed;Standby Assist  Transfer: Assistive Device: Nurse, adult  Transfers: Type Of Assistance: For Balance;For Safety Considerations  End Of Activity Status: In Bed;Nursing Notified;Instructed Patient to Request Assist with Mobility;Instructed Patient to Use Call Light (Family present at end of session)    Gait  Gait Distance: 350 feet  Gait: Assistance Level: Minimal Assist;Safety Considerations (CGA)  Gait: Assistive Device: Roller Walker  Gait: Descriptors: Pace: Normal;Swing-Through Gait;Normal step length;No balance loss  Comments: Patient ambulates in hallway using RW and contact guard assist. 2nd person initially present for safety, however, patient appearing to have significantly improved balance and stability, therefore, all mobility completed safely with Ax1. Demos good walker management and command following.  Activity Limited By: Patient Choice    Education  Persons Educated: Patient/Family  Patient Barriers To Learning: None Noted  Interventions: Repetition of Instructions;Family Education  Teaching Methods: Verbal Instruction  Patient Response: Verbalized Understanding  Topics: Plan/Goals of PT Interventions;Use of Assistive Device/Orthosis;Mobility Progression;Importance of Increasing Activity;Ambulate With Nursing  Comments: Discussed patient's therapy progress with family and patient, stating he likely will not require SNF upon discharge anymore. Recommended ordering a RW for patient  to use if neded upon discharge home. Patient's family in agreement with discharging home with RW.    Assessment/Progress  Impaired Mobility Due To: Medical Status Limitation  Assessment/Progress: Should Improve w/ Continued PT    Comments: Patien with good tolerance to therapy evaluation this date, able to complete all mobility with contact guard assist and use of RW for safety. Patient demonstrates significantly impoved mobility and mental status compared to previous session with OT, therefore, changing discharge recommendation to home.    AM-PAC 6 Clicks Basic Mobility Inpatient  Turning from your back to your side while in a flat bed without using bed rails: None  Moving from lying on your back to sitting on the side of a flat bed without using bedrails : None  Moving to and from a bed to a chair (including a wheelchair): None  Standing up from a chair using your arms (e.g. wheelchair, or bedside chair): None  To walk in hospital room: A Little  Climbing 3-5 steps with a railing: A Little  Basic Mobility Inpatient Raw Score: 22  Standardized (T-scale) Score: 47.4  AM-PAC Basic Mobility Functional Stage: 34-51 Limited Mobility Indoors    Goals  Goal Formulation: With Patient/Family  Time For Goal Achievement: 3 days, To, 5 days  Patient Will Go Supine To/From Sit: Independently  Patient Will Transfer Bed/Chair: Independently  Patient Will Transfer Sit to Stand: Independently  Patient Will Ambulate: Greater than 200 Feet, w/ No Device, w/ Stand By Assist  Patient Will Go Up / Down Stairs: 1-2 Stairs, w/ Stand By Assist    Plan  Treatment Interventions: Mobility training  Plan Frequency: 1-2 Days per Week  PT Plan for Next Visit: progress gait distance and wean from walker, trial stairs    PT Discharge Recommendations  Recommendation: Home with intermittent supervision/assistance  Patient Currently Requires Supervision For: Mobility  Patient Currently Requires Equipment: Walker with wheels  Patient requires the use of a walker with wheels to complete ADLs in the home including meal preparation, ambulation to the bathroom for toileting, bathing and grooming, and safe home mobility.  Patient is unable to complete these ADLs with a cane or crutch and can safely use the walker.    Therapist  Tally Joe, PT, DPT [phone]  Date  06/08/2023

## 2023-06-08 NOTE — Progress Notes
Patient ordered for bladder scanning to monitor for retention. Patient's indwelling bladder scan performed by PCA Kathlene November observed to vary from 400-600 mL.  Patient attempted to void without success. ISC attempted X1 by this RN with patient complaint of discomfort during procedure. Procedure aborted for patient complaint of discomfort. Dr. Nicholes Rough updated and endorsed OK to monitor patient and recheck BVI at 0400.

## 2023-06-08 NOTE — Progress Notes
HC5 END OF SHIFT/PLAN OF CARE NURSING NOTE   Nursing Shift: Night Shift 1900-0700    Acute events, nursing interventions, & communication with providers: Patient denied questions/concerns after reviewing plan of care. Patient reports understanding of ongoing monitoring of urinary output and intermittent straight cath'ing as needed. Patient's FSBS monitoring reviewed and patient displayed understanding with limiting choices in HS snack.       Patient Goal(s)  Patient will Demonstrate improved self care by the end of next shift.        Patient will  Demonstrate improved self care by discharge.   Admission Weight: Weight: 49.7 kg (109 lb 9.6 oz)    Last 3 Weights:   Vitals:    06/05/23 1939 06/05/23 2333 06/06/23 0744   Weight: 49.7 kg (109 lb 9.6 oz) 63 kg (138 lb 14.2 oz) 62.6 kg (138 lb)     Weight Change: Weight trend stable    Intake/Output Summary (Last 24 hours) at 06/08/2023 0752  Last data filed at 06/08/2023 0739  Gross per 24 hour   Intake 1270 ml   Output 1750 ml   Net -480 ml     Last Bowel Movement Date: 06/07/23    Fluid Restriction? No   Quality/Safety    Total Fall Risk Score: 17   Risk for Injury related to falls: Coagulopathies/risk for bleed  Fall Risk Category:   History of More Than One Fall Within 6 Months Before Admission: Yes  Elimination, Bowel and Urine: N/A  Interventions: Use of bladder management device (e.g., male/male external urinary containment device)   Medications: On 2 or more high fall risk drugs  Interventions: Use of gait belt , Stay within arm's reach during toileting/showering (i.e., dizziness, orthostasis) , Educate patient on medication side effects, and Bed/chair alarm (i.e., change in mental status)   Patient Care Equipment: One present  Interventions: Needs assistance with patient care equipment when ambulating, Ensure environment is free of clutter and walkways are clear from tripping hazards, and Assess need for patient equipment and remove if not in use  Mobility: 2 - Assistance required, 2 - Unsteady gait  Interventions: Assist x1, Gait belt in use when ambulating, and Utilize walker, cane, or additional walking aid for ambulation  Cognition: 0 - No cognition issues  Interventions: Bed/Chair Alarm  and Stay within arm's reach while patient ambulating/toileting/showering    Other safety precautions in place: HAPI Prevention Bundle    Restraints:  No      Patient Education  This RN provided education to Patient today. The following education topics were reviewed:  Quality/Safety Education:   Fall risk  Medication Education:   Medication management (Indication, adverse effects, monitoring, etc)  Education provided on the following medication(s): Insulin  Cardiac - Specific Education:   Heart failure management  General Education:   Bowel/Urinary elimination    The following teaching method(s) were used: Verbal  Response to learning: [employer]

## 2023-06-08 NOTE — Progress Notes
Patient's midnight vitals deferred for sleep bundle. Telemetry monitoring in place and patient resting in no apparent distress.

## 2023-06-08 NOTE — Progress Notes
Internal Medicine Daily Progress Note      Patient's Name:  Kevin Obrien MRN: [id number]   Today's Date:  06/08/2023  Admission Date: 06/05/2023  LOS: 3 days    Problem list  Principal Problem:    AMS (altered mental status)  Active Problems:    Diabetes mellitus (HCC)    Hyperlipidemia    Peripheral artery disease (HCC)    Diabetic polyneuropathy associated with type 2 diabetes mellitus (HCC)    Essential tremor    Underweight    Elevated troponin I level    Poorly controlled diabetes mellitus (HCC)    Nephrotic range proteinuria      Assessment and Plan:     Riggins Hanson is a 72 y.o. with PMH of CAD, Diabetes mellitus type II, HTN, HLD and PVD who presents to the ER with AMS. Elevated troponin concerning for NSTEMI and influenza positive. Cardiology consulted who determined most likely demand ischemia with no further inpatient workup necessary. Patient admitted to medicine for influenza and altered mental status treatment.    06/08/2023 Updates:  - no fever overnight, on empiric CAP antibiotics x5 days  - void without assistance this morning  - pending SNF placement      # AMS   # Fall with LOC  # ? TIA   - Pt presented with acute AMS, slow response and flu like symptoms.   - Patient alert and oriented to himself with no acute focal deficit.   - Stable vitals   - Labs notable for hyperglycemia and lactic acidosis   - CT Head with No acute intracranial hemorrhage or mass effect. 2.  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   Plan:   > d/c tele  > Avoid sedative medications   > Ordered 2D ECHO   - EF 70%, similar dynamic LVOT seen in 2020, not significantly changed.  > Ordered MRI Head - chronic infarcts, none acute.  > ASA and statin        # Influenza A   # Empiric community acquired pneumonia coverage  - Symptomatic with no oxygen requirement  - CXR with Mild bibasilar linear atelectasis or scarring. No consolidation.  Plan:  > Tamiflu x 5 days (12/25 - present)  > blood cultures obtain and antibiotics with recurrent fever and tachycardia   IV ceftriaxone (12/26 - end 12/30)   IV doxycycline (12/26 - end 12/30)   > improvement with 1 liter IV fluids      # Elevated troponin, suspect demand ischemia  # history of NSTEMI,   # CAD status post PCI  # PVC  - Pt endorse some shortness of breath but denies chest pain.   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    Plan:   > d/c Telemetry   > heparin gtt stopped with discussion with cardiology  > ASA and statin   > Resume Coreg   >  Consult cardiology    - echo as above   - no further ischemic workup needed   - recommend outpatient follow up with established cardiologist Dr.  Habib  > will increase atorvastatin to 80 mg with elevated LDL, cardiac and stroke history      # Type II DM, uncoltrolled  # Peripheral neuropathy   - Continue home insulin regimen and gabapentin   - LDCF  - Hbg A1c 11.9%, family notes inconsistent insulin use  Plan  > diabetic education consult ordered   - recommend basal bolus simple insulin regimen on discharge   - CGM rx with outpatient DM provider   - place supply orders on discharge as listed in DM note    # HTN  # HLD  - continue Amlodipine, Coreg, atorvastatin  > holding lisinopril in setting of overnight hypotension and possible LVOT on echo       # GERD  - Pantoprazole 20 mg      # BPH   - continue Flomax        Diet: Diet Diabetic (Standard) Consistent Carb  VTE ppx: Heparin gtt  CODE: Full Code    DISPO: Continue admission to Med 3 pending SNF placement.    Seen and discussed with cosigned attending physician.    Kevin Roles, MD  Internal Medicine, PGY-1  On [facility] - Pager [phone]      Subjective:     No acute overnight events. Reports sleeping well. Chest pain resolved. Mild non-productive cough. No swelling. No dysuria or diarrhea reported. 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 Does not normally retain urine, but decline some intermittent caths due to pain. Has normal urination this morning.    Objective:   Vital Signs:  BP (!) 148/64 (BP Source: Arm, Right Upper)  - Pulse 77  - Temp 36.6 ?C (97.8 ?F)  - Ht 165.1 cm (5' 5)  - Wt 62.6 kg (138 lb)  - SpO2 96%  - BMI 22.96 kg/m?     Intake/Output Summary:  (Last 24 hours)    Intake/Output Summary (Last 24 hours) at 06/08/2023 0751  Last data filed at 06/08/2023 0739  Gross per 24 hour   Intake 1270 ml   Output 1750 ml   Net -480 ml           Gen: AOx3, NAD, fatigued, better after sleeping  HEENT: Normocephalic, atraumatic. EOMI, MMM  Resp: CTAB, no accessory muscle use, normal WOB.  CV: RRR, no murmurs, rubs or gallops. Normal pulses  Abd: Soft, non-tender, non-distended, BS+   Ext: no rashes, no edema, no joint swelling  Neuro: no gross focal deficits , deconditioned  Psych: mood stable  Skin: no rashes on exposed skin. Warm and dry.          Labs:  Recent Labs     06/06/23  0336 06/06/23  2226 06/07/23  0340 06/08/23  0343   HGB 13.7  --  13.2* 13.3*   WBC 7.70  --  7.70 4.80   PLTCT 154  --  137* 140*   NA 133* 133* 136* 138   K 3.4* 3.3* 3.1* 3.6   CL 100 100 103 107   CO2 24 22 24 23    BUN 20 32* 31* 19   CR 1.07 1.49* 1.11 0.82   GLU 315* 241* 112* 143*   CA 8.4* 8.3* 8.4* 8.6   ALBUMIN 3.5  --  3.2* 3.3*   AST 28  --  38 52*   ALT 19  --  20 26   ALKPHOS 49  --  43 49   TOTBILI 0.5  --  0.3 0.3       Meds:  Scheduled Meds:amLODIPine (NORVASC) tablet 10 mg, 10 mg, Oral, QDAY  aspirin chewable tablet 81 mg, 81 mg, Oral, QDAY  atorvastatin (LIPITOR) tablet 80 mg, 80 mg, Oral, QDAY  carvediloL (COREG) tablet 3.125 mg, 3.125 mg, Oral, BID  cefTRIAXone (ROCEPHIN) IVP 2 g, 2 g, Intravenous, Q24H*  doxycycline (VIBRAMYCIN) 100 mg in sodium chloride 0.9% (NS) 100 mL IVPB (MB+), 100 mg, Intravenous, Q12H*  enoxaparin (LOVENOX) syringe 40 mg, 40 mg, Subcutaneous, QDAY(21)  [Held by Provider] gabapentin (NEURONTIN) capsule 600 mg, 600 mg, Oral, TID  insulin aspart (U-100) (NOVOLOG FLEXPEN U-100 INSULIN) injection PEN 0-6 Units, 0-6 Units, Subcutaneous, ACHS (22)  [Held by Provider] insulin aspart (U-100) (NOVOLOG FLEXPEN U-100 INSULIN) injection PEN 18 Units, 18 Units, Subcutaneous, TID w/ meals  insulin glargine (LANTUS SOLOSTAR U-100 INSULIN) injection PEN 42 Units, 42 Units, Subcutaneous, QHS(22)  [Held by Provider] lisinopriL (ZESTRIL) tablet 40 mg, 40 mg, Oral, QDAY  mirabegron (MYRBETRIQ) ER tablet 50 mg, 50 mg, Oral, QDAY  oseltamivir (TAMIFLU) capsule 30 mg, 30 mg, Oral, BID  pantoprazole DR (PROTONIX) tablet 20 mg, 20 mg, Oral, QDAY(21)  tamsulosin (FLOMAX) capsule 0.4 mg, 0.4 mg, Oral, QDAY    Continuous Infusions:      PRN and Respiratory Meds:acetaminophen Q4H PRN, capsaicin TID PRN, dextrose 50% (D50) IV PRN, melatonin QHS PRN, ondansetron Q6H PRN **OR** ondansetron (ZOFRAN) IV Q6H PRN, polyethylene glycol 3350 QDAY PRN, sennosides-docusate sodium QDAY PRN

## 2023-06-08 NOTE — Progress Notes
HC5 END OF SHIFT/PLAN OF CARE NURSING NOTE   Nursing Shift: day shift 0700-1900    Acute events, nursing interventions, & communication with providers: Patient remained alert and oriented x4 during this shift was up in the chair for about 8 hrs up with assist 1 using a walker ,still having bladder retention,unable to urine on his own,bladder scanner completed then straight was done with 500 ml drained ,team notified and a order to do bladder scan  Q 6 hrs was placed    Patient Goal(s)  Patient will Show diminished episodes of delirium by the end of next shift.        Patient will  Verbalize readiness for discharge by discharge.   Admission Weight: Weight: 49.7 kg (109 lb 9.6 oz)    Last 3 Weights:   Vitals:    06/05/23 1939 06/05/23 2333 06/06/23 0744   Weight: 49.7 kg (109 lb 9.6 oz) 63 kg (138 lb 14.2 oz) 62.6 kg (138 lb)         Intake/Output Summary (Last 24 hours) at 06/07/2023 1822  Last data filed at 06/07/2023 1800  Gross per 24 hour   Intake 2720 ml   Output 1075 ml   Net 1645 ml     Last Bowel Movement Date: 06/07/23    Fluid Restriction? No   Quality/Safety    Total Fall Risk Score: 15   Risk for Injury related to falls: Standard risk for injury based on the ABCS scoring tool  Fall Risk Category:   History of More Than One Fall Within 6 Months Before Admission: Yes  Elimination, Bowel and Urine: N/A  Interventions: Commode at bedside, Place male/male urinal within reach , and Bed pan available in room  Medications: On 2 or more high fall risk drugs  Interventions: Bedside commode (i.e., urgency, frequency, dizziness) , Use of gait belt , Stay within arm's reach during toileting/showering (i.e., dizziness, orthostasis) , and Bed/chair alarm (i.e., change in mental status)   Patient Care Equipment: One present  Interventions: Needs assistance with patient care equipment when ambulating and Ensure environment is free of clutter and walkways are clear from tripping hazards  Mobility: 2 - Assistance required  Interventions: Assist x2, Gait belt in use when ambulating, and Elimination equipment at bedside (urinal or commode)   Cognition: 0 - No cognition issues  Interventions: Bed/Chair Alarm  and Stay within arm's reach while patient ambulating/toileting/showering    Other safety precautions in place: HAPI Prevention Bundle    Restraints:  No      Patient Education  This RN provided education to Patient and Family today. The following education topics were reviewed:  Quality/Safety Education:   Fall risk and VTE prophylaxis  Medication Education:   Medication management (Indication, adverse effects, monitoring, etc)  Education provided on the following medication(s): LR  Cardiac - Specific Education:   Cardiac diet/nutrition  General Education:   Bowel/Urinary elimination and Safe use of medical device/equipment: Straight Cath    The following teaching method(s) were used: Verbal  Response to learning: Some Evidence of Learning, Needs Reinforcement  Needs reinforcement on: Fall Risk.

## 2023-06-09 ENCOUNTER — Encounter: Admit: 2023-06-09 | Discharge: 2023-06-09 | Payer: MEDICARE

## 2023-06-09 DIAGNOSIS — G250 Essential tremor: Secondary | ICD-10-CM

## 2023-06-09 DIAGNOSIS — I2510 Atherosclerotic heart disease of native coronary artery without angina pectoris: Secondary | ICD-10-CM

## 2023-06-09 DIAGNOSIS — Z794 Long term (current) use of insulin: Secondary | ICD-10-CM

## 2023-06-09 DIAGNOSIS — E1142 Type 2 diabetes mellitus with diabetic polyneuropathy: Secondary | ICD-10-CM

## 2023-06-09 DIAGNOSIS — Z5889 Other problems related to physical environment: Secondary | ICD-10-CM

## 2023-06-09 DIAGNOSIS — N3281 Overactive bladder: Secondary | ICD-10-CM

## 2023-06-09 DIAGNOSIS — E785 Hyperlipidemia, unspecified: Secondary | ICD-10-CM

## 2023-06-09 DIAGNOSIS — Z79899 Other long term (current) drug therapy: Secondary | ICD-10-CM

## 2023-06-09 DIAGNOSIS — Z87891 Personal history of nicotine dependence: Secondary | ICD-10-CM

## 2023-06-09 DIAGNOSIS — R636 Underweight: Secondary | ICD-10-CM

## 2023-06-09 DIAGNOSIS — Z7985 Long-term (current) use of injectable non-insulin antidiabetic drugs: Secondary | ICD-10-CM

## 2023-06-09 DIAGNOSIS — I21A1 Myocardial infarction type 2: Secondary | ICD-10-CM

## 2023-06-09 DIAGNOSIS — Z556 Problems related to health literacy: Secondary | ICD-10-CM

## 2023-06-09 DIAGNOSIS — I119 Hypertensive heart disease without heart failure: Secondary | ICD-10-CM

## 2023-06-09 DIAGNOSIS — Z8673 Personal history of transient ischemic attack (TIA), and cerebral infarction without residual deficits: Secondary | ICD-10-CM

## 2023-06-09 DIAGNOSIS — E1165 Type 2 diabetes mellitus with hyperglycemia: Secondary | ICD-10-CM

## 2023-06-09 DIAGNOSIS — E871 Hypo-osmolality and hyponatremia: Secondary | ICD-10-CM

## 2023-06-09 DIAGNOSIS — Z9049 Acquired absence of other specified parts of digestive tract: Secondary | ICD-10-CM

## 2023-06-09 DIAGNOSIS — K219 Gastro-esophageal reflux disease without esophagitis: Secondary | ICD-10-CM

## 2023-06-09 DIAGNOSIS — E1151 Type 2 diabetes mellitus with diabetic peripheral angiopathy without gangrene: Secondary | ICD-10-CM

## 2023-06-09 DIAGNOSIS — I493 Ventricular premature depolarization: Secondary | ICD-10-CM

## 2023-06-09 DIAGNOSIS — G929 Unspecified toxic encephalopathy: Secondary | ICD-10-CM

## 2023-06-09 DIAGNOSIS — N401 Enlarged prostate with lower urinary tract symptoms: Secondary | ICD-10-CM

## 2023-06-09 DIAGNOSIS — E876 Hypokalemia: Secondary | ICD-10-CM

## 2023-06-09 DIAGNOSIS — Z609 Problem related to social environment, unspecified: Secondary | ICD-10-CM

## 2023-06-09 DIAGNOSIS — Z7982 Long term (current) use of aspirin: Secondary | ICD-10-CM

## 2023-06-09 DIAGNOSIS — J09X1 Influenza due to identified novel influenza A virus with pneumonia: Secondary | ICD-10-CM

## 2023-06-09 DIAGNOSIS — Z1152 Encounter for screening for COVID-19: Secondary | ICD-10-CM

## 2023-06-09 DIAGNOSIS — E872 Acidosis, unspecified: Secondary | ICD-10-CM

## 2023-06-09 DIAGNOSIS — J168 Pneumonia due to other specified infectious organisms: Secondary | ICD-10-CM

## 2023-06-09 DIAGNOSIS — Z955 Presence of coronary angioplasty implant and graft: Secondary | ICD-10-CM

## 2023-06-09 DIAGNOSIS — I252 Old myocardial infarction: Secondary | ICD-10-CM

## 2023-06-09 DIAGNOSIS — Z833 Family history of diabetes mellitus: Secondary | ICD-10-CM

## 2023-06-09 LAB — POC GLUCOSE
~~LOC~~ BKR POC GLUCOSE: 264 mg/dL — ABNORMAL HIGH (ref 70–100)
~~LOC~~ BKR POC GLUCOSE: 129 mg/dL — ABNORMAL HIGH (ref 70–100)

## 2023-06-09 MED ORDER — OSELTAMIVIR 30 MG PO CAP
30 mg | ORAL_CAPSULE | Freq: Two times a day (BID) | ORAL | 0 refills | Status: DC
Start: 2023-06-09 — End: 2023-06-09

## 2023-06-09 MED ORDER — DOXYCYCLINE HYCLATE 100 MG PO CAP
100 mg | ORAL_CAPSULE | Freq: Two times a day (BID) | ORAL | 0 refills | 8.00000 days | Status: AC
Start: 2023-06-09 — End: ?
  Filled 2023-06-09: qty 4, 2d supply, fill #1

## 2023-06-09 MED ORDER — AMLODIPINE 10 MG PO TAB
5 mg | ORAL_TABLET | Freq: Every day | ORAL | 0 refills | Status: AC
Start: 2023-06-09 — End: ?
  Filled 2023-06-09: qty 45, 90d supply, fill #1

## 2023-06-09 NOTE — Progress Notes
D/C Day Progress Note    Patient was seen and discussed with the resident team this AM. He I doing very well with family at bedside. He would like to go home.  VSS  Labs reviewed and stable  No new physical exam findings  Review of problem list  # Acute toxic encephalopathy - resolved  # Fall with LOC - resolved  # ? TIA   Influenza A  CAP  Demand ischemia - Type II NSTEMI  CAD s/p PCI in the past  PVC  Type II DM  Peripheral Neuropathy  HTN  HLD  GERD  BPH  Kevin Obrien is dong great and ready for discharge today with family at  bedside and supportive. Reviewed PT/Ot recommendations that have shifted to a home plan. Patient and family are agreeable to this. He is being discarged in stable condition and will complete doxycycline for  more days to complete antibiotics. He already completed Tamiflu. Total D/C time spend was  35 minutes.    [name], DO  Internal Medicine  Pager [phone]

## 2023-06-09 NOTE — Discharge Instructions - Pharmacy
Discharge Summary      Name: Kevin Obrien  Medical Record Number: [id number]        Account Number:  [id number]  Date Of Birth:  [birth year]                         Age:  72 y.o.  Admit date:  06/05/2023                     Discharge date: 06/09/2023      Discharge Attending:  Dr. Yetta Barre  Discharge Summary Completed By: [name], MD    Service: Med 3- 2071    Reason for hospitalization:  AMS (altered mental status) [R41.82]    Primary Discharge Diagnosis:   AMS (altered mental status)    Hospital Diagnoses:  Hospital Problems        Active Problems    * (Principal) AMS (altered mental status)    Diabetes mellitus (HCC)    Hyperlipidemia    Peripheral artery disease (HCC)    Diabetic polyneuropathy associated with type 2 diabetes mellitus (HCC)    Essential tremor    Underweight    Elevated troponin I level    Poorly controlled diabetes mellitus (HCC)    Nephrotic range proteinuria     Present on Admission:   AMS (altered mental status)   Diabetes mellitus (HCC)   Diabetic polyneuropathy associated with type 2 diabetes mellitus (HCC)   Essential tremor   Hyperlipidemia   Peripheral artery disease (HCC)        Significant Past Medical History        Accidental fall  Angina pectoris (HCC)  Arthritis  BPH (benign prostatic hyperplasia)  Cataract  Constipation  Coronary artery disease  DM (diabetes mellitus) (HCC)  ED (erectile dysfunction)  Heart attack (HCC)  Hyperlipidemia  Hypertension  Joint pain  Neuropathy  Shingles    Allergies   Seasonal allergies    Brief Hospital Course   The patient was admitted and the following issues were addressed during this hospitalization: (with pertinent details including admission exam/imaging/labs).     [facility] Gauer is a 72 y.o. with PMH of CAD, Diabetes mellitus type II, HTN, HLD and PVD who presented to the ER with AMS. Elevated troponin concerning for NSTEMI and influenza positive. Cardiology consulted who determined most likely demand ischemia with no further inpatient workup necessary. Patient admitted to medicine for influenza and altered mental status treatment. Patient had fevers, hypotension, tachycardia during admission which resolved with IV fluids and starting empiric antibiotics for community acquired pneumonia with influenza suspicion. Patients altered mental status resolved with rest, antibiotics, and Tamiflu treatment.    Patient is stable, afebrile, and feeling well today, ready for discharge home as recommended by therapy teams.     Patient recommended to follow up with established cardiologist Dr. Magnus Ivan for continuity.    Patient recommended to follow up with PCP for regular follow up of diabetic medication regimen.  Duplicate long acting insulin order on home medications was deleted, patient will only use glargine.    To complete pneumonia treatment, patient will take Doxycycline twice daily for 4 more total doses.    Items Needing Follow Up   Pending items or areas that need to be addressed at follow up:   - Follow up with Cardiologist Dr. Magnus Ivan for regular monitoring.  - Follow up with primary care doctor for Diabetes management, currently uncontrolled.    Pending Labs and  Follow Up Radiology    Pending labs and/or radiology review at this time of discharge are listed below: Please note- any labs with collected status will not have a result; if this area is blank, there are no items for review.   Pending Labs       Order Current Status    POC GLUCOSE In process    POC GLUCOSE In process    POC GLUCOSE In process    CULTURE-BLOOD W/SENSITIVITY Preliminary result    CULTURE-BLOOD W/SENSITIVITY Preliminary result              Medications      Medication List      START taking these medications     doxycycline hyclate 100 mg capsule; Commonly known as: VIBRAMYCIN; Dose:   100 mg; Take one capsule by mouth twice daily for 4 doses. Indications: a   lower respiratory infection; For: a lower respiratory infection; Quantity:   4 capsule; Refills: 0     CONTINUE taking these medications     amLODIPine 10 mg tablet; Commonly known as: NORVASC; Dose: 5 mg; Take   one-half tablet by mouth daily. Indications: high blood pressure; For:   high blood pressure; Quantity: 90 tablet; Refills: 0   aspirin 81 mg chewable tablet; Dose: 81 mg; Refills: 0   atorvastatin 40 mg tablet; Commonly known as: LIPITOR; Dose: 40 mg;   Refills: 0   capsaicin 0.025 % topical cream; Commonly known as: TRIXAICIN; Apply on   the affected area for neuropathic pain. Wash hands after application;   Quantity: 21 g; Refills: 3   carvediloL 3.125 mg tablet; Commonly known as: COREG; Refills: 0   cetirizine 10 mg tablet; Commonly known as: ZyrTEC; Dose: 10 mg; Take   one tablet by mouth daily.; Quantity: 30 tablet; Refills: 0   FREESTYLE LIBRE 2 READER reader; Generic drug: flash glucose scanning   reader; Use as directed.; Quantity: 1 each; Refills: 0   FREESTYLE LIBRE 2 SENSOR sensor; Generic drug: flash glucose sensor; Use   as directed.; Quantity: 2 kit; Refills: 3   gabapentin 300 mg capsule; Commonly known as: NEURONTIN; Dose: 600 mg;   Take two capsules by mouth three times daily.; Quantity: 90 capsule;   Refills: 5   glucose 4 gram chewable tablet; Commonly known as: DEX4 GLUCOSE; Dose:   16 g; Doctor's comments: E11.65  Authorizing physician:  Truitt Merle; Chew 4 tablets by mouth as Needed.; Quantity: 50 tablet;   Refills: 3   hydrOXYzine HCL 25 mg tablet; Commonly known as: ATARAX; Dose: 25 mg;   Refills: 0   ibuprofen 200 mg tablet; Commonly known as: ADVIL; Dose: 200 mg;   Refills: 0   insulin lispro (U-100) 100 unit/mL subcutaneous PEN; Commonly known as:   HUMALOG KWIKPEN INSULIN; Inject 18 u three times daily with meals;   Quantity: 30 mL; Refills: 1   Ketamine HCl 5%/Baclofen 2%/Clonidine HCl 0.2%/Gabapentin   5%/Amitriptyline HCl 2% in LipoPen Ulra Cream (COMPOUND); Apply topically   to the affected area(s) as directed 1-2 times daily as needed for   neuropathic pain.; Quantity: 100 g; Refills: 3   LANTUS SOLOSTAR U-100 INSULIN 100 unit/mL (3 mL) subcutaneous PEN;   Generic drug: insulin glargine; Doctor's comments: Authorizing Provider:   Dr. Tonny Branch NPI [id number]; Inject forty Units under the skin twice   daily.; Quantity: 75 mL; Refills: 3   lisinopriL 40 mg tablet; Commonly known as: ZESTRIL; Dose: 40 mg;   Refills: 0  MYRBETRIQ 50 mg ER tablet; Generic drug: mirabegron; Dose: 50 mg; Take   one tablet by mouth daily.; Quantity: 90 tablet; Refills: 0   omeprazole DR 20 mg capsule; Commonly known as: PriLOSEC; Refills: 0   oxyCODONE-acetaminophen 5-325 mg tablet; Commonly known as: PERCOCET;   Dose: 1 tablet; Take one tablet by mouth at bedtime as needed for Pain;   Quantity: 15 tablet; Refills: 0   peg-electrolyte solution 420 gram oral solution; Commonly known as:   NULYTELY; Doctor's comments: Ok to substitute for any generic golytely,   Gavilyte-C or Gavilyte-G.; The day prior to your procedure: At 11:00 A.M.   fill the Nulytely/Golytely jug to the fill line with lukewarm drinking   water, cap the jug and shake to dissolve the powder.  Place the jug in the   refrigerator. At 5:00 P.M. on the day PRIOR to your procedure: drink one   8-ounce glass of Nulytely/Golytely and repeat every (10-15) minutes until   you have finished the first three fourths gallon of Nulytely/Golytely (3)   liters. Place remaining liquid in the refrigerator. Day of Procedure: Five   hours before your scheduled procedure time drink the remaining   Nulytely/Golytely 8-ounces at a time. Repeat every (10-15) minutes until   you have finished.  Indications: emptying of the bowel; For: emptying of   the bowel; Quantity: 4000 mL; Refills: 0   pen needle, diabetic 32 gauge x 5/32 pen needle; Commonly known as: BD   NANO 2nd GEN PEN NEEDLE; Dose: 1 each; Doctor's comments: e11.65; Use one   each as directed five times daily. Use with insulin injections.; Quantity:   500 each; Refills: 3   semaglutide 1 mg/dose (2 mg/1.5 mL) pen injector; Commonly known as:   OZEMPIC; Dose: 1 mg; Inject one mg under the skin every 7 days.; Quantity:   3 mL; Refills: 3   sildenafiL 100 mg tablet; Commonly known as: VIAGRA; TAKE 1 TABLET BY   MOUTH AS NEEDED FOR ERECTILE DYSFUNCTION 1 TO 4 HOURS PRIOR TO SEXUAL   ACTIVITY; Quantity: 30 tablet; Refills: 0   tamsulosin 0.4 mg capsule; Commonly known as: FLOMAX; Dose: 0.4 mg; Take   one capsule by mouth daily. Take 30 min after same meal.  Do not cut/   crush/ chew.; Quantity: 90 capsule; Refills: 3     STOP taking these medications     LEVEMIR FLEXPEN 100 unit/mL (3 mL) injection pen; Generic drug: insulin   detemir U-100       Return Appointments and Scheduled Appointments   No appointment scheduled    Consults, Procedures, Diagnostics, Micro, Pathology   Consults: Cardiology  Surgical Procedures & Dates: None  Significant Diagnostic Studies, Micro and Procedures: noted in brief hospital course  Significant Pathology: none                                   Discharge Disposition, Condition   Patient Disposition: Home or Self Care [01]  Condition at Discharge: Stable    Code Status   Full Code    Patient Instructions     Activity       Activity as Tolerated   As directed      It is important to keep increasing your activity level after you leave the hospital.  Moving around can help prevent blood clots, lung infection (pneumonia) and other problems.  Gradually increasing the number of times you are  up moving around will help you return to your normal activity level more quickly.  Continue to increase the number of times you are up to the chair and walking daily to return to your normal activity level. Begin to work toward your normal activity level at discharge          Diet       Diabetic Diet   As directed      You should eat between 1600 and 2000 calories per day.  This is equal to 60g (grams) of carbohydrates per meal, and 30g of carbohydrates for a bedtime snack.    If you have questions about your diet after you go home, you can call a dietitian at [phone].             Discharge education provided to patient.    Additional Orders: Case Management, Supplies, Home Health     Home Health/DME       None              Signed:  [name], MD  06/09/2023      cc:  Primary Care Physician:  Barton Dubois   Verified    Referring physicians:  No ref. provider found   Additional provider(s):        Did we miss something? If additional records are needed, please fax a request on office letterhead to [phone]. Please include the patient's name, date of birth, fax number and type of information needed. Additional request can be made by email at [email or web address]. For general questions of information about electronic records sharing, call [phone].

## 2023-06-09 NOTE — Progress Notes
HC5 END OF SHIFT/PLAN OF CARE NURSING NOTE   Nursing Shift: Day Shift 0700-1900    Acute events, nursing interventions, & communication with providers: Communicated with [name], MD regarding post void bladder scan and patient refusal of straight cath due to pain from previous attempt. Per MD, okay to hold off on straight cath and repeat bladder scan this evening.       Patient Goal(s)  Patient will Report progressive increase in activity tolerance by the end of next shift.        Patient will  Verbalize readiness for discharge by discharge.   Admission Weight: Weight: 49.7 kg (109 lb 9.6 oz)    Last 3 Weights:   Vitals:    06/05/23 1939 06/05/23 2333 06/06/23 0744   Weight: 49.7 kg (109 lb 9.6 oz) 63 kg (138 lb 14.2 oz) 62.6 kg (138 lb)     Weight Change: Weight trend stable    Intake/Output Summary (Last 24 hours) at 06/08/2023 1800  Last data filed at 06/08/2023 1552  Gross per 24 hour   Intake 360 ml   Output 1750 ml   Net -1390 ml     Last Bowel Movement Date: 06/07/23    Fluid Restriction? No   Quality/Safety    Total Fall Risk Score: 17   Risk for Injury related to falls: Coagulopathies/risk for bleed  Fall Risk Category:   History of More Than One Fall Within 6 Months Before Admission: Yes  Elimination, Bowel and Urine: N/A  Interventions: N/A - Does not score for risk in this category and Place male/male urinal within reach   Medications: On 2 or more high fall risk drugs  Interventions: Use of gait belt , Stay within arm's reach during toileting/showering (i.e., dizziness, orthostasis) , Educate patient on medication side effects, and Bed/chair alarm (i.e., change in mental status)   Patient Care Equipment: One present  Interventions: Needs assistance with patient care equipment when ambulating, Ensure environment is free of clutter and walkways are clear from tripping hazards, and Assess need for patient equipment and remove if not in use  Mobility: 2 - Assistance required, 2 - Unsteady gait  Interventions: Assist x1, Gait belt in use when ambulating, and Utilize walker, cane, or additional walking aid for ambulation  Cognition: 0 - No cognition issues  Interventions: N/A - Does not score as risk in this category    Other safety precautions in place: N/A    Restraints:  No      Patient Education  This RN provided education to Patient today. The following education topics were reviewed:  Quality/Safety Education:   Fall risk  Medication Education:   Medication management (Indication, adverse effects, monitoring, etc)  Education provided on the following medication(s): insulin  Cardiac - Specific Education:   Cardiac diet/nutrition  General Education:   Bowel/Urinary elimination, Discharge planning, Glucose management, and Mobility/Activity intolerance    The following teaching method(s) were used: Verbal  Response to learning: [employer]

## 2023-06-09 NOTE — Case Management (ED)
Case Management Progress Note    NAME:Kevin Obrien                          MRN: [id number]              DOB:1950-09-25          AGE: 72 y.o.  ADMISSION DATE: 06/05/2023             DAYS ADMITTED: LOS: 4 days      Today's Date: 06/09/2023    PLAN: Discharged home    Expected Discharge Date: 06/09/2023   Is Patient Medically Stable: Yes   Are there Barriers to Discharge? no    INTERVENTION/DISPOSITION:  Discharge Planning              Discharge Planning: Skilled Nursing Facility    [facility] reviewed EMR.  PT recommending home with HH.    Transportation              Does the Patient Need Case Management to Arrange Discharge Transport? (ex: facility, ambulance, wheelchair/stretcher, Medicaid, cab, other): No  Will the Patient Use Family Transport?: Yes  Transportation Name, Phone and Availability #1: no specific family member identified  Support              Support: Pt/Family Updates re:POC or DC Plan, Huddle/team update  Info or Referral              Information or Referral to [facility]: No Needs Identified  Positive SDOH Domains and Potential Barriers                   Medication Needs              Medication Needs: No Needs Identified                   Financial              Financial: No Needs Identified  Legal              Legal: No Needs Identified  Other              Other/None: No needs identified  Discharge Disposition                               Selected Continued Care - Discharged on 06/09/2023 Admission date: 06/05/2023 - Discharge disposition: Home or Self Care   No services have been selected for the patient.         Placido Sou, LMSW  Available on Voalte  P: [phone]

## 2023-06-09 NOTE — Progress Notes
END OF SHIFT SUMMARY     Admission Date: 06/05/2023  Length of Stay: LOS: 4 days    Patient demeanor and cognition: Alert and oriented X4    Acute events, pain management, and nursing interventions: No acute events. Patient rested well overnight.     Mobility: Patient ambulated in room with X1 assist and walker. Patient tolerated ambulation well.       Last Bowel Movement Date: 06/08/23       Patient Education  Patient education provided: Other risk factor/prevention education (specify which) - fall bundle  Other patient education provided:  Learners: Patient  Method/materials used: Verbal teaching  Response to learning: Some Evidence of Learning, Needs Reinforcement      Patient Goal(s):  Patient will Report progressive increase in activity tolerance by the end of shift         Patient will  Demonstrate improved self care by discharge date   Other:

## 2023-06-11 LAB — CULTURE-BLOOD W/SENSITIVITY

## 2023-06-21 ENCOUNTER — Encounter: Admit: 2023-06-21 | Discharge: 2023-06-21 | Payer: MEDICARE

## 2023-08-17 ENCOUNTER — Encounter: Admit: 2023-08-17 | Discharge: 2023-08-17

## 2023-08-30 ENCOUNTER — Encounter: Admit: 2023-08-30 | Discharge: 2023-08-30

## 2023-08-30 ENCOUNTER — Emergency Department
Admit: 2023-08-30 | Discharge: 2023-08-30 | Disposition: A | Discharge status: Home / Self Care | Attending: Student in an Organized Health Care Education/Training Program

## 2023-08-30 ENCOUNTER — Emergency Department: Admit: 2023-08-30 | Discharge: 2023-08-30 | Attending: Student in an Organized Health Care Education/Training Program

## 2023-08-30 NOTE — Telephone Encounter
-    At 1100 pt triaged in an exam room at [facility] along with his wife who interpreted part of the conversation for him. Reports Rt Mid Back Pain ever since he fell on a large rock at his home & now has Rt Leg Weakness. Denies hitting his head or having a loss of bowel or bladder.    -  Above reviewed with Bronson Curb APRN.     -  Instructed pt that it is best that she would be evaluated in the ER given his current symptoms & possible need for imaging which can't be performed in UC. Offered wheelchair &/or assist from [employer]. Pt & wife declined. Instructed the pt to rest on his way there if needed & to ask for assistance at any of the information desks along the way if needed.     -  Pt & wife reported understanding & agreed with the plan.

## 2023-10-17 ENCOUNTER — Emergency Department: Admit: 2023-10-17 | Discharge: 2023-10-17 | Payer: MEDICARE

## 2023-10-17 ENCOUNTER — Inpatient Hospital Stay: Admit: 2023-10-17 | Discharge: 2023-10-17 | Payer: MEDICARE

## 2023-10-17 ENCOUNTER — Inpatient Hospital Stay
Admit: 2023-10-17 | Discharge: 2023-10-28 | Disposition: A | Discharge status: Rehabilitation Facility | Payer: MEDICARE | Attending: Neurology | Admitting: Neurology

## 2023-10-17 ENCOUNTER — Encounter: Admit: 2023-10-17 | Discharge: 2023-10-17 | Payer: MEDICARE

## 2023-10-17 LAB — 2D + DOPPLER ECHO
AORTIC VALVE STROKE VOLUME INDEX: 35
ASCENDING AORTA: 2.9 cm
AV HCM GRAD VALS: 41 mmHg
AV INDEX (NATIVE): 0.6
AV LVOT PEAK GRADIENT: 30 mmHg
AV PEAK VELOCITY: 1.5 m/s
BSA: 1.7 m2
DOP CALC LVOT AREA: 3.1 cm2
DOP CALC LVOT DIAMETER: 2 cm
DOP CALC LVOT PEAK VEL VTI: 19 cm
DOP CALC LVOT PEAK VEL: 1 m/s
DOP CALC LVOT STROKE VOLUME: 61 cm3
E WAVE DECELARTION TIME: 259 ms
E/A RATIO: 0.7
ECHO EF: 65 %
EJECTION FRACTION: 59 %
FRACTIONAL SHORTENING: 35 % (ref 28–44)
INTERVENTRICULAR SEPTUM: 1.2 cm (ref 0.6–1)
LATERAL E/E' RATIO: 10
LEFT ATRIUM INDEX: 17 mL/m2 (ref 16–34)
LEFT ATRIUM SIZE: 3.5 cm (ref 3–4)
LEFT ATRIUM VOLUME: 31 mL (ref 18–58)
LEFT INTERNAL DIMENSION IN SYSTOLE: 2.4 cm (ref 2.5–4)
LEFT VENTRICLE DIASTOLIC VOLUME INDEX: 61 mL/m2 (ref 34–74)
LEFT VENTRICLE DIASTOLIC VOLUME: 106 mL (ref 62–150)
LEFT VENTRICLE MASS INDEX: 84 g/m2 (ref 49–115)
LEFT VENTRICLE SYSTOLIC VOLUME INDEX: 22 mL/m2 (ref 11–31)
LEFT VENTRICLE SYSTOLIC VOLUME: 39 mL (ref 21–61)
LEFT VENTRICULAR INTERNAL DIMENSION IN DIASTOLE: 3.7 cm (ref 4.2–5.8)
LEFT VENTRICULAR MASS: 147 g (ref 88–224)
MEDIAL E/E' RATIO: 10
MV PEAK A VEL: 1 m/s
MV PEAK E VEL PW: 0.7 m/s
POSTERIOR WALL: 1.2 cm (ref 0.6–1)
RA PRESSURE: 3
RELATIVE WALL THICKNESS: 0.6 (ref <=0.42)
RIGHT ATRIAL AREA: 10 cm2 (ref <18)
RIGHT HEART SYSTOLIC MMODE TAPSE: 1.3 cm (ref >1.7)
RIGHT HEART SYSTOLIC TDI S': 0.1 m/s
RIGHT VENTRICULAR BASAL DIAMETER: 3 cm (ref 2.5–4.1)
RIGHT VENTRICULAR MID DIAMETER: 2.8 cm (ref 1.9–3.5)
RV SYSTOLIC PRESSURE: 21
SINUS: 2.8 cm (ref 2.8–4)
TDI LATERAL E': 0 m/s
TDI MEDIAL E': 0 m/s
TR PEAK VELOCITY: 2.3 m/s
TV REST PULMONARY ARTERY PRESSURE: 24 mmHg

## 2023-10-17 LAB — HIGH SENSITIVITY TROPONIN I 2 HOUR
~~LOC~~ BKR HIGH SENSITIVITY TROPONIN I 2 HOUR: 8 ng/L — ABNORMAL LOW (ref 3.5–<20)
~~LOC~~ BKR HIGH SENSITIVITY TROPONIN I DELTA VALUE: 0 mmol/L — ABNORMAL LOW (ref 98–110)

## 2023-10-17 LAB — COMPREHENSIVE METABOLIC PANEL
~~LOC~~ BKR ALBUMIN: 4.1 g/dL (ref 3.5–5.0)
~~LOC~~ BKR ALK PHOSPHATASE: 55 U/L (ref 25–110)
~~LOC~~ BKR ALT: 20 U/L (ref 7–56)
~~LOC~~ BKR ANION GAP: 9 10*3/uL (ref 3–12)
~~LOC~~ BKR AST: 17 U/L (ref 7–40)
~~LOC~~ BKR BLD UREA NITROGEN: 16 mg/dL (ref 7–25)
~~LOC~~ BKR CALCIUM: 9.3 mg/dL (ref 8.5–10.6)
~~LOC~~ BKR CHLORIDE: 100 mmol/L (ref 98–110)
~~LOC~~ BKR CO2: 28 mmol/L (ref 21–30)
~~LOC~~ BKR CREATININE: 0.7 mg/dL (ref 0.40–1.24)
~~LOC~~ BKR GLOMERULAR FILTRATION RATE (GFR): >60 mL/min (ref >60–0.45)
~~LOC~~ BKR TOTAL BILIRUBIN: 0.7 mg/dL (ref 0.2–1.3)
~~LOC~~ BKR TOTAL PROTEIN: 6.9 g/dL (ref 6.0–8.0)

## 2023-10-17 LAB — URINALYSIS DIPSTICK REFLEX TO CULTURE
~~LOC~~ BKR LEUKOCYTES: NEGATIVE
~~LOC~~ BKR NITRITE: NEGATIVE
~~LOC~~ BKR URINE BILE: NEGATIVE
~~LOC~~ BKR URINE BLOOD: NEGATIVE

## 2023-10-17 LAB — PTT (APTT): ~~LOC~~ BKR PTT: 27 s (ref 24.0–36.5)

## 2023-10-17 LAB — HIGH SENSITIVITY TROPONIN I 0 HOUR: ~~LOC~~ BKR HIGH SENSITIVITY TROPONIN I 0 HOUR: 8 ng/L (ref <20)

## 2023-10-17 LAB — KC ED MAIN ECG TRIAGE ONLY
P AXIS: 66 degrees
P-R INTERVAL: 132 ms
Q-T INTERVAL: 442 ms
QRS DURATION: 134 ms
QTC CALCULATION (BAZETT): 531 ms
R AXIS: -72 degrees
T AXIS: 41 degrees
VENTRICULAR RATE: 87 {beats}/min

## 2023-10-17 LAB — POC INR: ~~LOC~~ BKR POC INR: 1 10*6/uL — ABNORMAL LOW (ref 2.0–3.0)

## 2023-10-17 LAB — HIGH SENSITIVITY TROPONIN I 4 HR
~~LOC~~ BKR HI SEN TNI DELTA 4-2: 2
~~LOC~~ BKR HIGH SENSITIVITY TROPONIN I 4 HOUR: 10 ng/L (ref <20)

## 2023-10-17 LAB — URINALYSIS MICROSCOPIC REFLEX TO CULTURE: ~~LOC~~ BKR RBC, UA: 0 /HPF (ref 5.0–8.0)

## 2023-10-17 LAB — POC CREATININE: ~~LOC~~ BKR POC CREATININE: 0.8 mg/dL (ref 0.4–1.24)

## 2023-10-17 LAB — POC GLUCOSE
~~LOC~~ BKR POC GLUCOSE: 262 mg/dL — ABNORMAL HIGH (ref 70–100)
~~LOC~~ BKR POC GLUCOSE: 190 mg/dL — ABNORMAL HIGH (ref 70–100)

## 2023-10-17 LAB — CBC AND DIFF
~~LOC~~ BKR ABSOLUTE BASO COUNT: 0.1 10*3/uL (ref 0.00–0.20)
~~LOC~~ BKR HEMATOCRIT: 39 % — ABNORMAL LOW (ref 40.0–50.0)
~~LOC~~ BKR MCHC: 35 g/dL — ABNORMAL HIGH (ref 32.0–36.0)
~~LOC~~ BKR MDW (MONOCYTE DISTRIBUTION WIDTH): 18 (ref <=20.6)
~~LOC~~ BKR WBC COUNT: 6.7 10*3/uL (ref 4.50–11.00)

## 2023-10-17 LAB — HEMOGLOBIN A1C: ~~LOC~~ BKR HEMOGLOBIN A1C: 11 % — ABNORMAL HIGH (ref 4.0–5.7)

## 2023-10-17 MED ORDER — ASPIRIN 81 MG PO CHEW
81 mg | Freq: Every day | ORAL | 0 refills | Status: DC
Start: 2023-10-17 — End: 2023-10-18
  Administered 2023-10-18: 14:00:00 81 mg via ORAL

## 2023-10-17 MED ORDER — IOHEXOL 350 MG IODINE/ML IV SOLN
60 mL | Freq: Once | INTRAVENOUS | 0 refills | Status: CP
Start: 2023-10-17 — End: ?
  Administered 2023-10-17: 14:00:00 60 mL via INTRAVENOUS

## 2023-10-17 MED ORDER — INSULIN ASPART 100 UNIT/ML SC FLEXPEN
0-12 [IU] | Freq: Every day | SUBCUTANEOUS | 0 refills | Status: DC
Start: 2023-10-17 — End: 2023-10-18
  Administered 2023-10-17: 23:00:00 2 [IU] via SUBCUTANEOUS

## 2023-10-17 MED ORDER — SODIUM CHLORIDE 0.9 % IJ SOLN
50 mL | Freq: Once | INTRAVENOUS | 0 refills | Status: CP
Start: 2023-10-17 — End: ?
  Administered 2023-10-17: 14:00:00 50 mL via INTRAVENOUS

## 2023-10-17 MED ORDER — ENOXAPARIN 40 MG/0.4 ML SC SYRG
40 mg | Freq: Every day | SUBCUTANEOUS | 0 refills | Status: DC
Start: 2023-10-17 — End: 2023-10-28
  Administered 2023-10-18 – 2023-10-28 (×10): 40 mg via SUBCUTANEOUS

## 2023-10-17 MED ORDER — CLOPIDOGREL 75 MG PO TAB
75 mg | Freq: Every day | ORAL | 0 refills | Status: DC
Start: 2023-10-17 — End: 2023-10-18
  Administered 2023-10-18: 14:00:00 75 mg via ORAL

## 2023-10-17 MED ORDER — CLOPIDOGREL 300 MG PO TAB
300 mg | Freq: Once | ORAL | 0 refills | Status: CP
Start: 2023-10-17 — End: ?
  Administered 2023-10-17: 14:00:00 300 mg via ORAL

## 2023-10-17 MED ORDER — ACETAMINOPHEN 650 MG RE SUPP
650 mg | RECTAL | 0 refills | Status: DC | PRN
Start: 2023-10-17 — End: 2023-10-24

## 2023-10-17 MED ORDER — ASPIRIN 300 MG RE SUPP
300 mg | Freq: Once | RECTAL | 0 refills | Status: CP
Start: 2023-10-17 — End: ?
  Administered 2023-10-17: 14:00:00 300 mg via RECTAL

## 2023-10-17 MED ORDER — ACETAMINOPHEN 325 MG PO TAB
650 mg | ORAL | 0 refills | Status: DC | PRN
Start: 2023-10-17 — End: 2023-10-24
  Administered 2023-10-18 – 2023-10-23 (×8): 650 mg via ORAL

## 2023-10-17 MED ORDER — POTASSIUM CHLORIDE 20 MEQ/15 ML PO LIQD
40 meq | ORAL | 0 refills | Status: CP
Start: 2023-10-17 — End: ?
  Administered 2023-10-17 – 2023-10-18 (×2): 40 meq via ORAL

## 2023-10-17 MED ORDER — SODIUM CHLORIDE 0.9 % IJ SOLN
10 mL | Freq: Once | INTRAVENOUS | 0 refills | Status: CP
Start: 2023-10-17 — End: ?
  Administered 2023-10-17: 19:00:00 10 mL via INTRAVENOUS

## 2023-10-17 MED ORDER — PERFLUTREN LIPID MICROSPHERES 1.1 MG/ML IV SUSP
1-10 mL | Freq: Once | INTRAVENOUS | 0 refills | Status: CP | PRN
Start: 2023-10-17 — End: ?
  Administered 2023-10-17: 19:00:00 4 mL via INTRAVENOUS

## 2023-10-17 MED ORDER — DEXTROSE 50 % IN WATER (D50W) IV SYRG
12.5-25 g | INTRAVENOUS | 0 refills | Status: DC | PRN
Start: 2023-10-17 — End: 2023-10-28

## 2023-10-17 MED ORDER — SODIUM CHLOR 0.9% BACTERIOSTAT 0.9 % IJ SOLN (NO$)
20 mL | Freq: Once | INTRAMUSCULAR | 0 refills | Status: CP
Start: 2023-10-17 — End: ?
  Administered 2023-10-17: 19:00:00 20 mL via INTRAMUSCULAR

## 2023-10-17 NOTE — Acute Stroke Response
 Name: Kevin Obrien   MRN: [id number]     DOB: [birth year]      Age: 73 y.o.  Admission Date: 10/17/2023     LOS: 0 days     Date of Service: 10/17/2023       Allergies:  Seasonal allergies    Type of Acute Stroke Response Team note: H&P    Stroke Activation Tier: Tier 1         Assessment & Plan    Chief Complaint: right facial droop and left gaze  Assessment: Kevin Obrien is a 73 y.o. male with hypertension, diabetes and prior CAD s/p PCI (~10 years ago) presented with acute right facial droop, left gaze and dysarthria.     Last Known Well is 5/7 1930  NIHSS at the time of ASRT is 6   Blood Pressure on arrival: ~210/85  Imaging: CT head negative for acute changes.  CTA head and neck - no LVO but significant for calcified atherosclerosis and diffuse intracranial vascular stenoses  Intervention: none (out of TNK window, and no LVO)    Impression:   #Acute Ischemic Stroke  #Left Gaze Palsy  #Left Cerebral Stroke    Suspected localization of Stroke Sx: Left frontal operculum or insular region    Suspected etiology: Large - Artery Atherosclerosis vs small vessel disease    Pre-event Modified Rankin Scale (mRS): 0 - No symptoms at all     Plan:    - Admit to neurology on telemetry   - Loading with ASA and Plavix in ED, and continue DAPT ASA 81 and Plavix 75  - Start statin when able to swallow  - MRI head without contrast  - Initial permissive SBP up to 200  - PT/OT/SLP consult eval and treat    Patient has dysarthria and aphasia   Initial SLP recs:   - Stroke risk factor assessment    Labs to include FLP, A1c   Echocardiogram    Telemetry to monitor for arrhythmia    Evaluation of smoking history and smoking cessation consult if tobaccoism present   - CBC, BMP routine     PPX: SCDs and Lovenox for DVT prevention     The patient was seen and discussed with Dr. Alonso Jan    History of Present Illness    History of Present Illness: Assessment: Kevin Obrien is a 73 y.o. male with hypertension, diabetes and prior CAD s/p PCI (~10 years ago) presented with acute right facial droop, left gaze and dysarthria.     Family reported patient has been reporting transient visual disturbances for the last few weeks, headache, and yesterday upon coming back home, patient was nauseated, and reported weakness. Family assisted patient to bed. There was no facial droop at the time.    Review of Systems    A 14 point review of systems was negative except for: Eyes: positive for visual disturbance  Gastrointestinal: positive for nausea  Neurological: positive for dizziness and weakness    Stroke Activation Summary    Patient Arrival: [phone]  ASRT Arrival: 5621  Location of Response : ED   Page Received: 0808    Clinical Presentation: Aphasia, Dysarthria, Right facial droop, Visual changes    Signs & Symptoms: Onset of Symptoms  Onset of Symptoms - Date: 10/17/23  Onset of Symptoms - Time: 2100  Last Known Well - Date: 10/16/23  Last Known Well - Time: 1730     CT/CTP/CTA:        BP:  161/70 (05/08 1000)  Temp: 36.9 ?C (98.4 ?F) (05/08 4259)  Pulse: 72 (05/08 1000)  Respirations: 14 PER MINUTE (05/08 1000)  SpO2: 97 % (05/08 1000)  O2 Device: None (Room air) (05/08 0841)    NIHSS Completed at: 0825      NIH Stroke Scale Item  Scoring Definition  Score    1a. LOC  0=alert and responsive   1=arousable to minor stimulation   2=arousable only to painful stimulation   3=reflex responses or unrousable  0    1b. LOC questions-as patient?s age and month. Must be exact.  0=both correct   1=one correct (or dysarthria, intubated, foreign language)   2=neither correct  0    1c. Commands-open/close eyes, grip and release non-paretic hand (other 1 step commands or mimic OK)  0=both correct (ok if impaired by weakness)   1=one correct   2=neither correct  0    2. Best Gaze-horizontal EOM by voluntary or Doll?s  0=normal   1=partial gaze palsy (abnormal gaze in one or both eyes)   2=forced eye deviation or total paresis which cannot be overcome by Doll?s  2 3. Visual Field-use visual threat if necessary. If monocular, score field of good eye  0=no visual loss   1=partial hemianopia, quadrantanopia, extinction   2=complete hemianopia   3=bilateral hemianopia or blindness  0    4. Facial Palsy-if stuporous, check symmetry of grimace to pain  0=normal   1=minor paralysis, flat NLF, asymm smile   2=partial paralysis (lower face=UMN)   3=complete paralysis (upper and lower face)  2    5. Motor Arm-arms outstretched 90 deg (sitting) or 45 deg (supine) for 10 seconds. Encourage best effort.  0=no drift x 10 seconds   1=drift but doesn?t hit bed   2=some antigravity effort, but can?t sustain   3=no antigravity effort, but even minimal mvt counts   4=no movement at all   X=unable to assess due to amputation, fusion, etc  L/R   0/0    6. Motor Leg-raise leg to 30 degrees supine x 5 seconds  0=no drift x 5 seconds   1=drift but doesn?t hit bed   2=some antigravity effort, but can?t sustain   3=no antigravity effort, but even minimal mvt counts   4=no movement at all   X=unable to assess due to amputation, fusion, etc  L/R   0/0    7. Limb Ataxia-check finger-nose- finger; heel-shin; and score only if out of proportion to paralysis  0=no ataxia (or aphasic, hemiplegic)   1=ataxia in upper or lower extremity   2=ataxia in upper AND lower extremity   X=unable to assess due to amputation, fusion, etc  0   8. Sensory-use safety pin. Check grimace or withdrawal if stuporous. Score only stroke- related losses  0=normal   1=mild-mod unilateral loss but patient aware of touch 9or aphasic, confused)   2=total loss, pt unaware of touch. Coma, bilateral loss  0   9. Best Language-describe cookie jar picture, name objects, read sentences. May use repeating, writing, stereognosis  0=normal   1=mild-mod aphasia (diff but partly comprehensible)   2=severe aphasia (almost no info exchanged)   3=mute, global aphasia, coma. No 1 step commands  1    10. Dysarthria-read list of words  0=normal or intubated or other physical barrier  1=mild-mod; slurred but intelligible   2=severe; unintelligible or mute  1    11. Extinction/Neglect- simultaneously touch patient on both hands, show fingers in both visual fields, ask  about deficit, left hand  0=normal, none detected. (visual loss alone)   1=neglects or extinguishes to double stimulation in any modality   2=profound neglect in more than one modality  0    Score    6     Was the patient 4.5-9 hrs from last known well or a wake-up stroke? No    Was IV tenecteplase given? No    The patient was not a thrombolytic candidate due to Time of onset of symptoms or last known normal >4.5 hours    Advanced imaging was interpreted at [phone]  The patient was not a thrombectomy candidate due to no LVO     Dysphagia screen:                      Performed by nursing staff and passed or Failed screen by nursing staff and awaiting ST evaluation     Cardiac rhythm on presentation: NSR         Health History    Past Medical History:    Accidental fall    Angina pectoris    Arthritis    BPH (benign prostatic hyperplasia)    Cataract    Constipation    Coronary artery disease    DM (diabetes mellitus) (CMS-HCC)    ED (erectile dysfunction)    Heart attack (CMS-HCC)    Hyperlipidemia    Hypertension    Joint pain    Neuropathy    Shingles     Surgical History:   Procedure Laterality Date    CORONARY STENT PLACEMENT  2016    HX CHOLECYSTECTOMY  09/2015    COLONOSCOPY N/A 04/20/2016    Performed by Karalee Oscar, MD at [facility] ENDO    ESOPHAGOGASTRODUODENOSCOPY N/A 04/20/2016    Performed by Karalee Oscar, MD at [facility]    ESOPHAGOGASTRODUODENOSCOPY BIOPSY  04/20/2016    Performed by Karalee Oscar, MD at [facility] ENDO    UREA BREATH TEST N/A 11/13/2016    Performed by Dearl Fabry, MD at [facility] ENDO     Family History   Problem Relation Name Age of Onset    Diabetes Mother AMPARO CUBIAS     Diabetes Father JOSE Kreuser     Cancer-Lung Father JOSE Mcgillivray     Melanoma Neg Hx      Cataract Neg Hx Glaucoma Neg Hx      Macular Degen Neg Hx       Social History     Socioeconomic History    Marital status: Separated    Number of children: 3   Tobacco Use    Smoking status: Former     Current packs/day: 0.00     Average packs/day: 2.0 packs/day for 10.0 years (20.0 ttl pk-yrs)     Types: Cigarettes     Start date: 04/11/1961     Quit date: 04/12/1971     Years since quitting: 52.5    Smokeless tobacco: Never    Tobacco comments:     Former smoker/quit years ago   Vaping Use    Vaping status: Never Used   Substance and Sexual Activity    Alcohol use: Not Currently    Drug use: Never    Sexual activity: Not Currently     Partners: Female     Birth control/protection: None              Medications:  [START ON 10/18/2023] aspirin chewable tablet 81 mg, 81 mg, Oral, QDAY  [START ON  10/18/2023] clopiDOGreL (PLAVIX) tablet 75 mg, 75 mg, Oral, QDAY  enoxaparin (LOVENOX) syringe 40 mg, 40 mg, Subcutaneous, QDAY(21)        PRN Medications:  acetaminophen Q4H PRN **OR** acetaminophen Q4H PRN       Physical Exam    HEENT: normocephalic, eyes open with no discharge, nares patent, oropharynx is clear with no lesions, palate intact  CV: regular rate, distal pulses palpable  Chest: normal configuration, equal chest rise bilaterally  Ab: soft, non-tender, no masses, no organomegaly  Skin: no rashes or lesions    Extended Neuro Exam:    Mental status: alert, oriented to person/place/time  Speech:    Normal Abnormal   Fluency x    Comprehension x    Articulation  x   Repetition x    Naming  x       Cranial Nerves:    Normal Abnormal   II x    III, IV, VI  Forced left gaze deviation   V x    VII                          Mild right facial droop             VIII x    IX, X x    XI x    XII x        Muscle/motor:   Tone: mild spasticity on right side?  Bulk: nml  Fasciculations: none  Pronator drift: none     NF NE SA EF EE WE WF FF FE FA TA HF HA HE KF KE DF PF   R 5 5 5 5 5          5 5 5 5    L   5 5 5          5 5 5 5        Sensation: Normal RUE LUE RLE LLE   Light Touch x       Pin Prick        Temperature        Vibration        Proprioception            Coordination:    Normal Abnormal Right Abnormal Left   Finger to Nose X     Rapid alternating       Heel to [employer] tap      Foot tap        Gait and Station: Deferred    Reflexes:    Right Left   Triceps     Biceps     Brachioradialis     Patella     Ankle     Plantar            Lab/Radiology/Other Diagnostic Tests:  24-hour labs:    Results for orders placed or performed during the hospital encounter of 10/17/23 (from the past 24 hours)   CBC AND DIFF    Collection Time: 10/17/23  8:27 AM   Result Value Ref Range    White Blood Cells 6.70 4.50 - 11.00 10*3/uL    Red Blood Cells 4.62 4.40 - 5.50 10*6/uL    Hemoglobin 14.1 13.5 - 16.5 g/dL    Hematocrit 34.7 (L) 40.0 - 50.0 %    MCV 86.5 80.0 - 100.0 fL    MCH 30.6 26.0 - 34.0 pg    MCHC 35.3 32.0 -  36.0 g/dL    RDW 81.1 91.4 - 78.2 %    Platelet Count 177 150 - 400 10*3/uL    MPV 9.4 7.0 - 11.0 fL    Neutrophils 59.3 41.0 - 77.0 %    Lymphocytes 30.6 24.0 - 44.0 %    Monocytes 6.9 4.0 - 12.0 %    Eosinophils 2.2 0.0 - 5.0 %    Basophils 1.0 0.0 - 2.0 %    Absolute Neutrophil Count 4.00 1.80 - 7.00 10*3/uL    Absolute Lymph Count 2.10 1.00 - 4.80 10*3/uL    Absolute Monocyte Count 0.50 0.00 - 0.80 10*3/uL    Absolute Eosinophil Count 0.10 0.00 - 0.45 10*3/uL    Absolute Basophil Count 0.10 0.00 - 0.20 10*3/uL    MDW (Monocyte Distribution Width) 18.9 <=20.6   POC INR    Collection Time: 10/17/23  8:27 AM   Result Value Ref Range    INR POC 1.0 (L) 2.0 - 3.0    Protime POC 11.6    PTT (APTT)    Collection Time: 10/17/23  8:27 AM   Result Value Ref Range    APTT 27.6 24.0 - 36.5 Seconds   COMPREHENSIVE METABOLIC PANEL    Collection Time: 10/17/23  8:27 AM   Result Value Ref Range    Sodium 137 137 - 147 mmol/L    Potassium 3.3 (L) 3.5 - 5.1 mmol/L    Chloride 100 98 - 110 mmol/L    Glucose 253 (H) 70 - 100 mg/dL    Blood Urea Nitrogen 16 7 - 25 mg/dL    Creatinine 9.56 2.13 - 1.24 mg/dL    Calcium 9.3 8.5 - 08.6 mg/dL    Total Protein 6.9 6.0 - 8.0 g/dL    Total Bilirubin 0.7 0.2 - 1.3 mg/dL    Albumin 4.1 3.5 - 5.0 g/dL    Alk Phosphatase 55 25 - 110 U/L    AST 17 7 - 40 U/L    ALT 20 7 - 56 U/L    CO2 28 21 - 30 mmol/L    Anion Gap 9 3 - 12    Glomerular Filtration Rate (GFR) >60 >60 mL/min   HIGH SENSITIVITY TROPONIN I 0 HOUR    Collection Time: 10/17/23  8:27 AM   Result Value Ref Range    hs Troponin I 0 Hour 8 <20 ng/L   POC GLUCOSE    Collection Time: 10/17/23  8:28 AM   Result Value Ref Range    Glucose, POC 262 (H) 70 - 100 mg/dL   POC CREATININE    Collection Time: 10/17/23  8:28 AM   Result Value Ref Range    Creatinine, POC 0.8 0.4 - 1.24 mg/dL     Glucose: (!) 578 (46/96/29 0827)  POC Glucose (Download): (!) 262 (10/17/23 5284)  Pertinent radiology reviewed.

## 2023-10-17 NOTE — Progress Notes
 SPEECH-LANGUAGE PATHOLOGY  CLINICAL SWALLOW ASSESSMENT     Name: Kevin Obrien   MRN: [id number]     DOB: 1951-02-21      Age: 73 y.o.  Admission Date: 10/17/2023     LOS: 0 days     Date of Service: 10/17/2023        Evaluation Summary   Clinical swallow evaluation completed.   Clinical Impression: Moderate to severe dysphagia  Sources of Dysphagia:  (Weakness from suspected CVA)    Swallow Recommendations  PO: Ice chips only  Medications: Crushed in puree (*please contact SLP if s/s aspiration present w/ medication administration - may need alternate source)  Ice Chip Trials: Unlimited  Supervision: 1:1  Positioning: Upright as tolerated  Oral Hygiene: Complete oral care to minimize the risk of aspirating oral bacteria    PO Presentation  Presentations: Therapist Fed, Patient Fed Self  Thin Liquid: 1 Tsp, Cup  Mildly Thick Liquid: Cup, Consecutive Swallows  Other Consistencies: Ice chips, Puree    Clinical Interpretation of Oral Stage  Withdraw Bolus: WFL  Form Bolus: Slowed  Masticate Bolus:  (Pt masticating puree)  Transfer Bolus: Suspect early spillover  Anterior Bolus Spillage: None  Oral Residue: None    Clinical Interpretation of Pharyngeal Stage  Swallow Initiation:  (Present)  Laryngeal Elevation:  (Palpable)  Signs / Symptoms Of Aspiration: Thin liquid, Mildly thick liquid, Puree  Thin Liquid: Cough  Mildly Thick Liquid: Throat clear, Wet vocal quality  Puree: Cough (unclear if related to emotional lability as pt coughing while crying or related to PO. S/s aspiration not consistent w/ purees)  Suspected Pharyngeal Stage Impairment: Decreased laryngeal vestibule closure, Incomplete pharyngeal clearance    Oral Mech Exam  Oral Mech WFL: No  Lips: Impaired ROM - Bilateral, Impaired Strength - Bilateral  Tongue: Impaired Strength - Bilateral  Buccal: Impaired ROM - L, Impaired Strength - L (appears minimal)  Jaw: WFL  Vocal Quality:  (Dysarthria present)  Volitional Cough: Weak  Dentition: Dentures - Good fit    Subjective  Reason for Admission and Past Medical Hx: No H/P at time of evaluation; [name] and responding stroke RN providing verbal report. Pt w/ NIH 5 at time of arrival. Symptoms starting previous evening w/ dizzy spell and nausea. Pt went to bed and woke w/ L gaze deviation and facial droop. EMS noted L sided weakness w/ RN reporting resolution of L weakness. Failed SDS 2/2 dysarthria.  Mental / Cognitive: Alert, Oriented (Emotionally Labile)  Pain: No complaint of pain  Persons Present: Nursing Staff, Family    Imaging  CT HEAD WO CONTRAST  Result Date: 10/17/2023  1.  No acute intracranial hemorrhage or mass effect. 2.  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 3.  Additional cerebral hypodensities, likely secondary to chronic microvascular ischemic changes. Dr. Angelia Barcelona discussed these findings with Dr. Ulis Games by telephone on 10/17/2023 8:32 AM. By my electronic signature, I attest that I have personally reviewed the images for this examination and formulated the interpretations and opinions expressed in this report  Finalized by Christena Covert, M.D. on 10/17/2023 9:33 AM. Dictated by Golden Late, MD on 10/17/2023 8:30 AM.    CTA HEAD WO/W CONT  Result Date: 10/17/2023  CTA head: 1.  Multifocal stenosis of the anterior, middle, and posterior cerebral arteries most likely secondary to significant intracranial atherosclerosis. No large vessel central occlusion is identified 2.  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 CTA neck: 1.  Bilateral atherosclerotic plaque at the carotid bulbs and origins of the ICAs without significant/high-grade stenosis of the ICAs by NASCET criteria. 2.  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 3.  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 Dr. Angelia Barcelona discussed these findings with Dr. Ulis Games by telephone on 10/17/2023 8:37 AM. By my electronic signature, I attest that I have personally reviewed the images for this examination and formulated the interpretations and opinions expressed in this report  Finalized by Christena Covert, M.D. on 10/17/2023 9:26 AM. Dictated by Golden Late, MD on 10/17/2023 8:36 AM.    No results found.    Nutrition  Nutrition Prior To Hospitalization: Regular, Thin Liquids (family reporting no difficulties prior to hospitalization though was recently hospitalized w/ [facility])  Current Form Of Nutrition: NPO    Education  Persons Educated: Pt/Family  Barriers To Learning: None Noted  Interventions: Staff Educated, Family Educated  Teaching Methods: Verbal  Topics: Dysphagia  Patient Response: Verbalized Understanding  Goal Formulation: With Pt/Family    Assessment/Prognosis  Plan: 3-5 x/week  Prognosis: Fair  NOMS Dysphagia Rating: 2-Moderately-Severe Dysphagia -Not able to swallow safely by mouth for nutrition/hydration but may take some consistency w/ consistent max cues in therapy only. Alternative method of feeding required.    Clinical Swallow Goals  Goal : Pt will participate in ongoing swallow evaluation w/ min cues for participation.  Goal : Pt will participate in speech/language or cognitive communication evaluation as appropriate pending CVA workup.    Therapist:Karron Alvizo [place], SLP MA, [facility] [place]# [phone]  Date:10/17/2023

## 2023-10-17 NOTE — Progress Notes
 RT Adult Assessment Note    NAME:Kevin Obrien             MRN: [id number]             DOB:[birth year]          AGE: 73 y.o.  ADMISSION DATE: 10/17/2023             DAYS ADMITTED: LOS: 0 days    Additional Comments:  Impressions of the patient: Pt resting on RA  Intervention(s)/outcome(s): Eval  Patient education that was completed: none  Recommendations to the care team: Cont. To monitor pt, sx prn    Vital Signs:  Pulse: 69  RR: 16 PER MINUTE  SpO2: 97 %  O2 Device: None (Room air)  Liter Flow:    O2%:      Breath Sounds:   All Breath Sounds: Clear (Implies normal);Decreased  Respiratory Effort: unlabored     Comments:

## 2023-10-17 NOTE — Care Plan
 Problem: Discharge Planning  Goal: Participation in plan of care  Outcome: Goal Ongoing  Flowsheets (Taken 10/17/2023 1503)  Participation in Plan of Care: Involve patient/caregiver in care planning decision making  Goal: Knowledge regarding plan of care  Outcome: Goal Ongoing  Flowsheets (Taken 10/17/2023 1503)  Knowledge regarding plan of care:   Provide admission education to parent/caregiver   Provide fall prevention education   Provide infection prevention education   Provide plan of care education   Provide procedural and treatment education   Provide medication management education   Provide VTE signs and symptoms education  Goal: Prepared for discharge  Outcome: Goal Ongoing  Flowsheets (Taken 10/17/2023 1503)  Prepared for discharge:   Complete ADL ability assessment   Provide safe use medical equipment education   Provide diet and oral health education   Collaborate with multidisciplinary team for hospital discharge coordination     Problem: Neurological Status, Impaired/Altered  Goal: Progress toward maximizing functional outcomes  Outcome: Goal Ongoing  Flowsheets (Taken 10/17/2023 1503)  Progress toward maximizing functional outcomes:   Manage environmental safety   Address etiologies   Promote functional status   Observe patient activities   Provide positive coping methods education   Provide functional status promotion education   Provide impaired mental status condition education to family/caregiver   Orient patient to reality   Reduce impaired mental status risk   Consider consult for impaired/altered neurological status   Establish therapeutic relationship   Provide safety precautions education  Goal: Cognitive status restored to baseline  Outcome: Goal Ongoing  Flowsheets (Taken 10/17/2023 1503)  Cognitive status restored to baseline:   Assess patient history   Reduce delirium risk   Consider consult for impaired cognitive status   Observe patient acitvities   Provide delirium signs and symptoms education   Promote rest   Orient patient to reality   Complete delirium assessment scale     Problem: High Fall Risk  Goal: High Fall Risk  Outcome: Goal Ongoing  Flowsheets (Taken 10/17/2023 1503)  High Fall Risk:   All patients will receive: High fall risk sign, yellow wristband, yellow socks, gait belt, and shower shoes   Engage bed alarm - middle setting   Stay with the patient while toileting/showering   Engage chair alarm   PT/OT consult for fall prevention assessment if scoring in Unsteady Gait or Visual or Auditory impairment   Remove excess equipment/supplies   Educate patient to use call-light if tethered   Use shower shoes   Maximize bed functionality (optimize bed height, firm/flat surface)   Use safe patient handling equipment as appropriate   Assess need for bedside commode with drop arm to be available in room     Problem: Skin Integrity  Goal: Skin integrity intact  Outcome: Goal Ongoing  Flowsheets (Taken 10/17/2023 1503)  Skin integrity intact:   Assess nutrition   Provide skin care interventions   Promote nutrition   Assure position change   Provide incontinence management interventions   Monitor skin integrity   Reduce skin shear, friction and tissue load   Provide skin self-assessment education   Consider consult for skin integrity  Goal: Healing of skin (Wound & Incision)  Outcome: Goal Ongoing  Flowsheets (Taken 10/17/2023 1503)  Healing of wound (wounds and Incisions):   Assess for signs and symptoms of wound infection   Assess wound site healing   Implement wound/incision care as ordered   Provide wound/incision care as ordered  Goal: Healing of skin (Pressure Injury)  Outcome: Goal Ongoing  Flowsheets (Taken 10/17/2023 1503)  Healing of skin (Pressure Injury):   Assess for signs and symptoms of pressure injury infection   Use the National Pressure Injury Advisory Panel Staging System for grading pressure injuries   Assess pressure injury   Implement pressure injury care as ordered   Implement pressure relieving device/specialty mattress   Promote ambulation   Provide pressure injury care education as ordered     Problem: Mobility/Activity Intolerance  Goal: Maximize functional ADL's and mobility outcomes  Outcome: Goal Ongoing  Flowsheets (Taken 10/17/2023 1503)  Maximize functional ADLs and mobility outcomes:   Administer oxygen to maintain SpO2 at approprate levels   Maintain body position   Consider consult for mobility/activity intolerance   Manage environmental safety   Manage energy conservation for mobility/activity intolerance   Physical thrapy evaluation and treatment   Consider post-operative precautions   Occupational therapy evaulation and treatment   Cardiac Rehab evaluation and treatment     Problem: VTE, Risk of  Goal: Absence of venous thrombosis  Outcome: Goal Ongoing  Flowsheets (Taken 10/17/2023 1503)  Absence of venous thrombosis:   Utilize prevent measures   Assess peripheral circulation   Assess for signs and symptoms of venous thrombosis  Goal: Knowledge of warfarin regimen  Outcome: Goal Ongoing  Flowsheets (Taken 10/17/2023 1503)  Knowledge of warfarin regimen:   Provide dietary education   Provide adverse drug reaction education   Provide medication management and compliance education   Provide activity and self care education   Provide follow-up monitoring education

## 2023-10-17 NOTE — Acute Stroke Response
 RN Stroke Activation Summary    Date of Service:  10/17/2023  Kevin Obrien is a 73 y.o.  male.     DOB: September 04, 1950                  MRN#:  [id number]  Allergies:  Seasonal allergies    Patient Arrival: 0823  ASRT Arrival: 0813  Location of Response : ED    Page Received: [phone]  EMS Agency: [facility]  Outside Hospital: NA    Clinical Presentation: Aphasia, Dysarthria, Right facial droop, Visual changes    Total Stroke Scale Score: 5    Signs & Symptoms: Onset of Symptoms  Onset of Symptoms - Date: 10/17/23  Onset of Symptoms - Time: 2100  Last Known Well - Date: 10/16/23  Last Known Well - Time: 2130     Dysphagia screen:  Step 1: Does the Patient Have any of the Following Exclusions? : Slurred speech  STOP Screen and Enter Provider Notified: ED/ Stroke team                Plan:    Summary:  Patient not a candidate for acute stroke intervention at this time.  Please see Acute Stroke Response Provider Note for further information.     Radiology/Interventional Delay  Decision to IR: No  Delays: No      No current facility-administered medications for this encounter.    N/A  IV Thrombolytic in the Scanner: No    Plan: Admit to [facility]6         RN handoff: Jaynie Meyers, RN      Leonor Ramsay, RN

## 2023-10-18 ENCOUNTER — Inpatient Hospital Stay: Admit: 2023-10-18 | Discharge: 2023-10-18 | Payer: MEDICARE

## 2023-10-18 LAB — BASIC METABOLIC PANEL
~~LOC~~ BKR ANION GAP: 10 % (ref 3–12)
~~LOC~~ BKR BLD UREA NITROGEN: 11 mg/dL (ref 7–25)
~~LOC~~ BKR CALCIUM: 9.1 mg/dL — ABNORMAL HIGH (ref 8.5–10.6)
~~LOC~~ BKR CHLORIDE: 104 mmol/L (ref 98–110)
~~LOC~~ BKR CO2: 24 mmol/L (ref 21–30)
~~LOC~~ BKR CREATININE: 0.6 mg/dL — ABNORMAL HIGH (ref 0.40–1.24)
~~LOC~~ BKR GLOMERULAR FILTRATION RATE (GFR): >60 mL/min (ref >60–100.0)
~~LOC~~ BKR GLUCOSE, RANDOM: 264 mg/dL — ABNORMAL HIGH (ref 70–100)
~~LOC~~ BKR POTASSIUM: 4.4 mmol/L (ref 3.5–5.1)

## 2023-10-18 LAB — CBC
~~LOC~~ BKR MCH: 30 pg (ref 26.0–34.0)
~~LOC~~ BKR MCHC: 34 g/dL (ref 32.0–36.0)
~~LOC~~ BKR MPV: 9.2 fL (ref 7.0–11.0)
~~LOC~~ BKR PLATELET COUNT: 191 10*3/uL — ABNORMAL LOW (ref 150–400)
~~LOC~~ BKR RDW: 13 % — ABNORMAL HIGH (ref 11.0–15.0)

## 2023-10-18 LAB — POC GLUCOSE
~~LOC~~ BKR POC GLUCOSE: 258 mg/dL — ABNORMAL HIGH (ref 70–100)
~~LOC~~ BKR POC GLUCOSE: 240 mg/dL — ABNORMAL HIGH (ref 70–100)
~~LOC~~ BKR POC GLUCOSE: 247 mg/dL — ABNORMAL HIGH (ref 70–100)
~~LOC~~ BKR POC GLUCOSE: 151 mg/dL — ABNORMAL HIGH (ref 70–100)
~~LOC~~ BKR POC GLUCOSE: 319 mg/dL — ABNORMAL HIGH (ref 70–100)

## 2023-10-18 LAB — LIPID PROFILE: ~~LOC~~ BKR NON HDL CHOLESTEROL: 180 mg/dL (ref 137–147)

## 2023-10-18 MED ORDER — ATORVASTATIN 40 MG PO TAB
40 mg | Freq: Every day | NASOGASTRIC | 0 refills | Status: CN
Start: 2023-10-18 — End: ?

## 2023-10-18 MED ORDER — INSULIN ASPART 100 UNIT/ML SC FLEXPEN
0-24 [IU] | Freq: Every day | SUBCUTANEOUS | 0 refills | Status: DC
Start: 2023-10-18 — End: 2023-10-19
  Administered 2023-10-18: 18:00:00 16 [IU] via SUBCUTANEOUS

## 2023-10-18 MED ORDER — ASPIRIN 81 MG PO CHEW
81 mg | Freq: Every day | NASOGASTRIC | 0 refills | Status: CN
Start: 2023-10-18 — End: ?

## 2023-10-18 MED ORDER — CLOPIDOGREL 75 MG PO TAB
75 mg | Freq: Every day | NASOGASTRIC | 0 refills | Status: DC
Start: 2023-10-18 — End: 2023-10-24
  Administered 2023-10-19 – 2023-10-23 (×5): 75 mg via NASOGASTRIC

## 2023-10-18 MED ORDER — ATORVASTATIN 40 MG PO TAB
40 mg | Freq: Every day | NASOGASTRIC | 0 refills | Status: DC
Start: 2023-10-18 — End: 2023-10-22
  Administered 2023-10-18 – 2023-10-22 (×5): 40 mg via NASOGASTRIC

## 2023-10-18 MED ORDER — ASPIRIN 81 MG PO CHEW
81 mg | Freq: Every day | NASOGASTRIC | 0 refills | Status: DC
Start: 2023-10-18 — End: 2023-10-24
  Administered 2023-10-19 – 2023-10-23 (×5): 81 mg via NASOGASTRIC

## 2023-10-18 NOTE — Care Plan
 Problem: Discharge Planning  Goal: Participation in plan of care  Outcome: Goal Ongoing  Goal: Knowledge regarding plan of care  Outcome: Goal Ongoing  Goal: Prepared for discharge  Outcome: Goal Ongoing     Problem: Neurological Status, Impaired/Altered  Goal: Progress toward maximizing functional outcomes  Outcome: Goal Ongoing  Goal: Cognitive status restored to baseline  Outcome: Goal Ongoing     Problem: High Fall Risk  Goal: High Fall Risk  Outcome: Goal Ongoing     Problem: Skin Integrity  Goal: Skin integrity intact  Outcome: Goal Ongoing  Goal: Healing of skin (Wound & Incision)  Outcome: Goal Ongoing  Goal: Healing of skin (Pressure Injury)  Outcome: Goal Ongoing     Problem: Mobility/Activity Intolerance  Goal: Maximize functional ADL's and mobility outcomes  Outcome: Goal Ongoing     Problem: VTE, Risk of  Goal: Absence of venous thrombosis  Outcome: Goal Ongoing  Goal: Knowledge of warfarin regimen  Outcome: Goal Ongoing

## 2023-10-18 NOTE — Progress Notes
 PHYSICAL THERAPY  ASSESSMENT      Name: Kevin Obrien   MRN: [id number]     DOB: 06-Apr-1951      Age: 73 y.o.  Admission Date: 10/17/2023     LOS: 1 day     Date of Service: 10/18/2023      Mobility  Patient Turn/Position: Chair  Mobility Level [facility] Highest Level of Mobility (JH-HLM): Walk 10 steps or more (to the bathroom)  Distance Walked (feet): 30 ft  Level of Assistance: Assist X2  Assistive Device: Walker  Activity Limited By: Susette Erb Status Variability    Subjective  Significant Hospital Events: 73 y.o. male with hypertension, diabetes and prior CAD s/p PCI (~10 years ago) presented with acute right facial droop, left gaze and dysarthria.  Mental / Cognitive: Alert;Cooperative;Follows commands  Pain: No complaint of pain  Persons Present: Daughter;Occupational Therapist;Provider (s)    Home Living Situation  Lives With: Family  Type of Home: House  Entry Stairs: 2  In-Home Stairs: Able to live on main level  Patient Owned Equipment: Cane: Single point    Prior Level of Function  Level Of Independence: Independent with ADL and household mobility with device  History of Falls in Past 3 Months: Yes    ROM  ROM Comments: BLEs WFLs.    Strength  Overall Strength: Able to move all joints independently through available ROM    Bed Mobility/Transfer  Bed Mobility: Supine to Sit: Moderate Assist;Assist with Trunk;Head of Bed Elevated;No Rail  Transfer Type: Sit to/from Stand  Transfer: Assistance Level: To/From;Bed;Minimal Assist;x2 People  Transfer: Assistive Device: Nurse, adult  Transfers: Type Of Assistance: For Balance;For Safety Considerations  End Of Activity Status: Up in Chair;Nursing Notified;Instructed Patient to Request Assist with Mobility;Instructed Patient to Use Call Light (pads alarm activated)    Gait  Gait Distance: 30 feet  Gait: Assistance Level: Minimal Assist;x2 People (for guidance of walker and balance)  Gait: Assistive Device: Roller Walker  Gait: Descriptors: Pace: Slow;Loss of balance;Variable step length    Education  Persons Educated: Patient/Family  Patient Barriers To Learning: None Noted  Teaching Methods: Verbal Instruction  Patient Response: Verbalized Understanding  Topics: Plan/Goals of PT Interventions;Importance of Increasing Activity;Recommend Continued Therapy    Assessment/Progress  Impaired Mobility Due To: Impaired Balance;Decreased Activity Tolerance  Assessment/Progress: Should Improve w/ Continued PT    AM-PAC 6 Clicks Basic Mobility Inpatient  Turning from your back to your side while in a flat bed without using bed rails: A Little  Moving from lying on your back to sitting on the side of a flat bed without using bedrails : A Little  Moving to and from a bed to a chair (including a wheelchair): A Lot  Standing up from a chair using your arms (e.g. wheelchair, or bedside chair): A Lot  To walk in hospital room: A Lot  Climbing 3-5 steps with a railing: Total  Basic Mobility Inpatient Raw Score: 13  Standardized (T-scale) Score: 33.99  Mobility Goal [facility]: JH-HLM 4 Move to Chair    Goals  Goal Formulation: With Patient/Family  Time For Goal Achievement: 5 days  Patient Will Go Supine To/From Sit: w/ Stand By Assist  Patient Will Transfer Sit to Stand: w/ Stand By Assist  Patient Will Ambulate: 51-100 Feet, w/ Jeananne Mighty, w/ Stand By Assist  Patient Will Go Up / Down Stairs: 1-2 Stairs, w/ Stand By Assist    Plan  Treatment Interventions: Mobility training;Balance activities  Plan Frequency: 5-7 Days  per Week  PT Plan for Next Visit: Work on sitting and standing balance; gait with hand hold or rail as able.    PT Discharge Recommendations  Recommendation: Inpatient setting;Recommend rehab medicine consult    Therapist  Jolane Nations, PT  Date  10/18/2023

## 2023-10-18 NOTE — Progress Notes
 Per family, patient has not voided since early this morning.  Per chart, patient has not voided since 0600.  Patient was bladder scanned and found to have in bladder.  Patient attempted to void twice and was unable to.  Patient had several incontinent episodes overnight.  Provider notified and order for bladder scans and straight cath.  Patient stated I only go to the bathroom once in the morning and that is it everyday.  Patient refusing straight cath at this time.  Will continue to monitor urinary output and educating patient on risks of increased urine in bladder and retention issues.

## 2023-10-18 NOTE — Consults
 SPEECH-LANGUAGE PATHOLOGY  FLEXIBLE ENDOSCOPIC EVALUATION OF SWALLOWING ASSESSMENT     EVALUATION SUMMARY   A timeout procedure was completed prior to assessment confirming two patient identifiers.   Reviewed nature of procedure and clarified possible dietary restrictions before PO trials provided.     Fiberoptic endoscopic evaluation of swallowing (FEES) completed.  Utilized AMBU Single patient use scope. Lot #: [id number]  Overall Impression: Moderate-severe oropharyngeal dysphagia  Sources of Dysphagia: impaired motor processing/incoordination w/ a component of weakness    Swallow Recommendations  PO: Ice chips only w/ excellent oral care to minimize potential of aspirating oral bacteria   NPO: Consider short term non-oral nutrition  Medications: non-oral   Supervision: yes when taking ice  Dysphagia Management: dysphagia tx & anticpate need for repeat instrumental swallow eval given time/videoswallow study     Position: Sitting upright at 90 degrees  Scope Passage: Right nares  Noms Dysphagia Rating: 2- Moderately-Severe Dysphagia -Not able to swallow safely by mouth for nutrition/hydration but may take some consistency w/ consistent max cues in therapy only. Alternative method of feeding required.     Anatomy/Physiology Observation Comment   Secretion Management WNL     Base of Tongue Abnormal During structured speech tasks impaired ROM observed   Valleculae WNL    Epiglottis WNL    True Vocal Folds WNL;     False Vocal Folds WNL;     Arytenoids WNL;     Pyriform Sinuses WNL;     Pharyngeal Walls Abnormal; Left and Right During structured speech tasks impaired ROM observed       [place] Secretion Scale  Category Symptom and Score   Location Nil significant pooled secretions in pyriform fossae or laryngeal vestibule- 0   Amount in Pyriform Sinus Nil significant pooled secretions in pyriform fossae (0-20%)- 0   Response (not scored if not significant pooling of secretions) Secretions in pyriform fossae or laryngeal vestibule effectively cleared- 0   Total (maximum 7)  *A [place] Secretion Scale score >4 has positive predictive value for aspiration pneumonia 0     Miles, A., & Hunting, A. (2018). Development, intra- and inter-rater reliability of the [place] Secretion Scale (NZSS). International Journal of Speech-Language Pathology, 21(4), [phone]. [email or web address]     Swallow Initiation Laryngeal Elevation Penetration Aspiration Scale Score Pharyngeal Residue- Yale Residue Scale- Vallecula Pharyngeal Residue- Yale Residue Scale- Pyriforms Esophageal Backflow Effective Swallow Strategies   Thin Aspiration/penetration before the swallow Abnormal - Reduced white out 8.        Material enters the airway, passes below the vocal folds, and no effort is made to eject. II - Trace - 1-5% - Trace coating of the mucosa II - Trace - 1-5% - Trace coating of mucosa No None   Mildly thick Aspiration/penetration before the swallow Abnormal - Reduced white out 8.        Material enters the airway, passes below the vocal folds, and no effort is made to eject. II - Trace - 1-5% - Trace coating of the mucosa II - Trace - 1-5% - Trace coating of mucosa No None   Moderately thick Aspiration/penetration before the swallow Abnormal - Reduced white out 8.        Material enters the airway, passes below the vocal folds, and no effort is made to eject. III - Mild - 5-25% - Epiglottic ligament visible II - Trace - 1-5% - Trace coating of mucosa; of note mild residue at epiglottic rim and along posterior pharyngeal walls No  None   Puree Right pyriform sinus, Left pyriform sinus Abnormal - Reduced white out 8.        Material enters the airway, passes below the vocal folds, and no effort is made to eject. II - Trace - 1-5% - Trace coating of the mucosa III - Mild - 5-25% - Up wall to quarter full; of note mild residue at epiglottic rim and along posterior pharyngeal walls No None Summary:  Overall Impression:   Aspiration Events: The pt was observed to present w/ spillover into airway prior to swallow initiation w/ thin, mild & moderately thick liquids.  Attempted swallow strategies did not eliminate/decrease this occurrence.  Of note, w/ thin & mildly thick trace amounts of pharyngeal residue observed to slide over posterior commissure after the swallow- silent aspiration of pharyngeal residue after swallow.  W/ more viscous presentations a mild amount of pharyngeal residue was observed, cleared w/ liquid wash.    Attempted swallow strategies:  Chin tuck: not effective   Hard swallow: not effective   Hard fast swallow: not effective     Subjective  Mental / Cognitive: Alert, Oriented, Follows commands    Objective: Kevin Obrien is a 73 y.o. male with hypertension, diabetes and prior CAD s/p PCI (~10 years ago) presented with acute right facial droop, left gaze and dysarthria.     Pertinent dysphagia hx: n/a    Imaging:  IMPRESSION 5/8 ct head  1. No acute intracranial hemorrhage or mass effect.   2.  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   3.  Additional cerebral hypodensities, likely secondary to chronic   microvascular ischemic changes.     MRI head ordered- not completed at time of FEES    Nutrition  Nutrition Prior To Hospitalization: regular, thin   Current Form Of Nutrition: NPO; ice chips    Education*  Persons Educated: Patient, pt's daughter  Barriers To Learning: cognitive-comm of note formal eval not yet completed   Interventions: Staff Educated, Repetition of Instructions  Teaching Methods: Demonstration, Verbal  Topics: Dysphagia  Patient Response: Verbalized Understanding; anticipate ongoing education needed   Goal Formulation: With Patient & pt's daughter     Assessment/Prognosis  Plan: 3-5x/wk  Prognosis: fair-good    Goal : The pt will complete exercises to promote base of tongue, posterior pharyngeal wall and hyo-laryngeal strengthening given consistent cues.  Goal:  The pt will participate in trials of thin liquids and puree solids w/ use of hard, fast swallow tolerating free of pulmonary status changes.  Goal:  The pt will participate in a repeat instrumental swallow eval given consistent cues to maintain attention.     Therapist: Alba Huddle, MA, L/CCC-SLP, BCS-S         [place]: [phone]  Date: 10/18/2023

## 2023-10-18 NOTE — Progress Notes
 OCCUPATIONAL THERAPY  ASSESSMENT NOTE      Name: Melik Blancett   MRN: [id number]     DOB: 06-27-1950      Age: 73 y.o.  Admission Date: 10/17/2023     LOS: 1 day     Date of Service: 10/18/2023      Mobility  Patient Turn/Position: Chair  Mobility Level [facility] Highest Level of Mobility (JH-HLM): Walk 10 steps or more (to the bathroom)  Distance Walked (feet): 30 ft  Level of Assistance: Assist X2  Assistive Device: Walker  Activity Limited By: Susette Erb Status Variability    Subjective  Significant Hospital Events: 73 y.o. male with hypertension, diabetes and prior CAD s/p PCI (~10 years ago) presented with acute right facial droop, left gaze and dysarthria.  Mental / Cognitive: Alert;Cooperative;Follows commands  Pain: No complaint of pain  Pain level: Before activity;During activity;After activity  Persons Present: Daughter;Provider;Physical Therapist    Home Living Situation  Lives With: Family  Type of Home: House  Entry Stairs: 2  In-Home Stairs: Able to live on main level  Bathroom Setup: Tub/shower unit  Patient Owned Equipment: Cane: Single point    Prior Level of Function  Level Of Independence: Independent with ADL and household mobility with device  Comments: intermittent use of SPC per daughter  History of Falls in Past 3 Months: Yes    Vision  Comment: left gaze with difficulty crossing midline. patient's daughter reports patient has c/o blurred and double vision. neurology team present to assess visual deficits during therapy session    ADL's  Where Assessed: Standing at Sink  Grooming Assist: Minimal Assist  Grooming Deficits: Steadying;Teeth Care;Wash/Dry Face;Wash/Dry Hands    ADL Mobility  Bed Mobility: Supine to Sit: Moderate assist  Transfer Type: Sit to stand  Transfer: Assistance Level: From;Bed;Minimal assist;x2 people  Transfer: Assistive Device: Agricultural consultant: Type of Assistance: Elevated bed;For balance;For safety considerations  Other Transfer Type: Stand to sit  Other Transfer: Assistance Level: To;Bedside chair;Moderate assist;of 1st person;Minimal assist;of 2nd person  Other Transfer: Assistive Device: Nurse, adult  Other Transfer: Type of Assistance: Verbal cues;For balance;For safety considerations  End of Activity Status: Up in chair;Instructed patient to request assist with mobility;Instructed patient to use call light;Nursing notified (chair alarm on, all needs met)  Transfer Comments: patient with L sided lean and difficulty with maneuvering RW for stand>sit transfers, required increased assist and verbal cueing to align hips prior to sitting in chair.  Sitting Balance: Standby assist;Minimal assist (intermittent assist for steadying)  Standing Balance: 2 UE support;Minimal assist  Gait Distance: 30 feet  Gait: Assistance Level: Minimal assist;x2 people  Gait: Assistive Device: Roller walker  Gait Comments: assist for RW management and balance.    Activity Tolerance  Endurance: 3/5 Tolerates 25-30 Minutes Exercise w/Multiple Rests    Cognition  Expression: Expressive Aphasia;Mumbling/Slurred  Social Interaction: Interacts in a Network engineer  Problem Solving: Cueing to Sequence Task;Decreased Judgment/Safety  Attention: Awake/Alert    ROM  R UE ROM: WFL   R UE ROM Method: Active  L UE ROM: WFL   L UE ROM Method: Active  ROM Comments: BLEs WFLs.    Sensory  Overall Sensory: Pt Perceives Pressure in Both UEs in Gross Exam    Strength / Tone  R UE Strength: WFL  L UE Strength: WFL    Education  Persons Educated: Patient  Teaching Methods: Verbal Instruction  Patient Response: Verbalized Understanding  Topics: Role of OT, Goals for Therapy  Goal Formulation: With Patient/Family    Assessment  Prognosis: Good;w/Cont OT s/p Acute Discharge  Goal Formulation: Patient  Comments: patient will benefit from continued therapeutic intervention to maximize safety and independence with ADLs/ADL related mobility    AM-PAC 6 Clicks Daily Activity Inpatient  Putting on and taking off regular lower body clothes: A Lot  Bathing (Including washing, rinsing, drying): A Lot  Toileting, which includes using toilet, bedpan, or urinal: A Lot  Putting on and taking off regular upper body clothing: A Little  Taking care of personal grooming such as brushing teeth: A Little  Eating meals: A Little  Daily Activity Raw Score: 15  Standardized (T-scale) Score: 34.69    Plan  OT Frequency: 5x/week  OT Plan for Next Visit: outcome measure, toileting with clothing management, progress mobility    ADL Goals  Patient Will Perform All ADL's: w/ Stand By Assist    Functional Transfer Goals  Pt Will Perform All Functional Transfers: w/ Stand By Assist    OT Discharge Recommendations  Recommendation: Inpatient setting;Recommend rehab medicine consult  Patient Currently Requires Physical Assist With: All mobility;All personal care ADLs;All home functioning ADLs  Patient Currently Requires Supervision For: ADLs;Mobility    Therapist: Valentino Gasser, OTD, OTR/L  Date: 10/18/2023

## 2023-10-18 NOTE — Consults
 ATTESTATION    I personally performed the key portions of the E/M visit, discussed case with resident and concur with resident, Kevin Meier, MD, documentation of history, physical exam, assessment, and treatment plan unless otherwise noted.     Staff name:  Kevin Crow, DO Date: 10/18/2023       Physical Medicine & Rehabilitation Consult Service    Date of Service: 10/18/2023  Kevin Obrien is a 73 y.o..  DOB: 17-Jan-1951   MRN: [id number]  Financial Class: Payor: [employer] MEDICARE / Plan: HEALTHY BLUE DSNP - [place] MEDICARE / Product Type: *No Product type* /   Date of Admission: 10/17/2023  Referring Physician: Deanie Obrien, *  Reason for Consult: evaluate for Post-Acute Rehab/Placement  Precautions: Fall  Weight Bearing Precautions: WBAT    Active Problems  Principal Problem:    Ischemic stroke (CMS-HCC)  Gait abnormality  Impaired mobility/ADLs  Impaired transfers    Assessment & Plan       Kevin Obrien is a 73 y.o. male admitted to [facility] on 10/17/2023 with the following issues:    Acute right pontomedullary brainstem infarct     Post acute placement recommendations: acute inpatient rehabilitation facility     Patient likely has goals in at least two out of three therapeutic disciplines and medical complexity to warrant inpatient rehabilitation admission candidacy.     Impairments: poor activity tolerance, weakness, and vision  Activity Limitations:  toileting, transfers, ambulation, stairs, and memory  Participation Restrictions: unable to return home safely  Goals:    Gait and mobility SPV    Transfers SPV    ADLs SPV       Family / Patient Goals: return home with family assistance   Cognition / Communication Goals: Speech therapy will evaluate and treat cognition and communication deficits and assess for safe swallow  Rehabilitation Prognosis: Good    Tolerance for three hours of therapy a day: Good    Patient remains with barriers to admission, outlined below.  Dysphagia: The patient is at risk for or has significant dysphagia.  The primary team is currently addressing adequate nutritional access, and the patient will need to make sure the patient is tolerating appropriate tubefeeding prior to admission to acute inpatient rehabilitation.    Other recommendations:   Impaired gait/mobility:  The patient will benefit from continued work with PT to address mobility deficits     Impaired ADL:  The patient will benefit from ongoing OT to address functional deficits     Moderate to severe oropharyngeal dysphagia:  Recommend ST continue to follow for ongoing therapies to ensure advance of swallow function and eventual return to PO status vs. potential need for PEG if no improvement.   The patient is currently on a NPO Ice Chips diet. he will benefit from ongoing work with ST.     Cog evaluation  Patient will benefit from formal SLP or OT cog evaluation with MOCA, SLUMS or other cognitive tests at current or next level of care to detect presence, domain and extent of cognitive impairments    Delirium protocol:   Avoid benzodiazepines, opiates and anticholinergics if possible as can contribute/cause delirium.   Frequent reorientation, use of clocks/calendars, verbal reminders of current location and situation.   A calm, comfortable environment that includes familiar objects from home.   Involvement of family members.   Uninterrupted periods of sleep at night, with low levels of noise and minimal light.   Open blinds  during the day to promote daytime alertness and a regular sleep-wake cycle.      Thank you for this consultation.  Please call our consult pager with questions or concerns.    Kevin Burrs, DO  Physical Medicine and Rehabilitation     History of Present Illness     CC:   Chief Complaint   Patient presents with    Stroke     HPI provided by patient and from chart review:    Hospital Course: [name] is a 73 y.o. male and  has a past medical history of Accidental fall, Angina pectoris, Arthritis, BPH (benign prostatic hyperplasia), Cataract, Constipation, Coronary artery disease, DM (diabetes mellitus) (CMS-HCC), ED (erectile dysfunction), Heart attack (CMS-HCC), Hyperlipidemia, Hypertension, Joint pain, Neuropathy, and Shingles. The patient presented to [facility] on 10/17/2023 with left gaze deviation, facial weakness and dysarthria.  CT head was negative for acute changes.  CTA head and neck without LVO.  Patient was not a TNK or EVT candidate. 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 Neurology recommend DAPT with aspirin and Plavix.  He is currently on permissive hypertension. The patient's hospital course is functionally complicated by impaired mobility and ADLs.  The patient is working with skilled therapies to address functional and mobility deficits, rehab is now consulted for post-acute rehab/placement recommendations.     Patient seen and examined at bedside this morning with daughter present.  He reports living with his spouse who works and 69 year old daughter.  They are able to provide intermittent assistance.  They live in a house with 2 steps for entry and able to live on the main level.  Most recent therapy notes reviewed, bed mobility moderate assist, transfer minimal assist x 2, gait 30 feet minimal assist x 2, and noted to have moderate to severe oropharyngeal dysphagia with speech therapy.  Discussed different levels of rehabilitation facility and both the patient and family would like [facility].      Past Medical History:    Accidental fall    Angina pectoris    Arthritis    BPH (benign prostatic hyperplasia)    Cataract    Constipation    Coronary artery disease    DM (diabetes mellitus) (CMS-HCC)    ED (erectile dysfunction)    Heart attack (CMS-HCC)    Hyperlipidemia    Hypertension    Joint pain    Neuropathy    Shingles        Surgical History:   Procedure Laterality Date    CORONARY STENT PLACEMENT  2016 HX CHOLECYSTECTOMY  09/2015    COLONOSCOPY N/A 04/20/2016    Performed by Karalee Oscar, MD at [facility] ENDO    ESOPHAGOGASTRODUODENOSCOPY N/A 04/20/2016    Performed by Karalee Oscar, MD at [facility] ENDO    ESOPHAGOGASTRODUODENOSCOPY BIOPSY  04/20/2016    Performed by Karalee Oscar, MD at [facility] ENDO    UREA BREATH TEST N/A 11/13/2016    Performed by Dearl Fabry, MD at [facility] END[facility]        Social History     Socioeconomic History    Marital status: Separated    Number of children: 3   Tobacco Use    Smoking status: Former     Current packs/day: 0.00     Average packs/day: 2.0 packs/day for 10.0 years (20.0 ttl pk-yrs)     Types: Cigarettes     Start date: 04/11/1961     Quit date: 04/12/1971     Years since  quitting: 52.5    Smokeless tobacco: Never    Tobacco comments:     Former smoker/quit years ago   Vaping Use    Vaping status: Never Used   Substance and Sexual Activity    Alcohol use: Not Currently    Drug use: Never    Sexual activity: Not Currently     Partners: Female     Birth control/protection: None     Family History   Problem Relation Name Age of Onset    Diabetes Mother AMPARO CUBIAS     Diabetes Father JOSE Lineberry     Cancer-Lung Father JOSE Zuver     Melanoma Neg Hx      Cataract Neg Hx      Glaucoma Neg Hx      Macular Degen Neg Hx          Scheduled Meds:[START ON 10/19/2023] aspirin chewable tablet 81 mg, 81 mg, Per NG tube, QDAY  atorvastatin (LIPITOR) tablet 40 mg, 40 mg, Per NG tube, QDAY  [START ON 10/19/2023] clopiDOGreL (PLAVIX) tablet 75 mg, 75 mg, Per NG tube, QDAY  enoxaparin (LOVENOX) syringe 40 mg, 40 mg, Subcutaneous, QDAY(21)  insulin aspart (U-100) (NOVOLOG FLEXPEN U-100 INSULIN) injection PEN 0-24 Units, 0-24 Units, Subcutaneous, 5 X Daily    Continuous Infusions:  PRN and Respiratory Meds:acetaminophen Q4H PRN **OR** acetaminophen Q4H PRN, dextrose 50% (D50) IV PRN       Allergies   Allergen Reactions    Seasonal Allergies RHINITIS       Prior Level of Function      The patient was independent for all home distance/community distance mobility/ambulation and activities of daily living.      Home Environment:  No data recorded  No data recorded  Type of Home: House (10/18/2023 11:00 AM)    Entry Stairs: 2 (10/18/2023 11:00 AM)    In-Home Stairs: Able to live on main level (10/18/2023 11:00 AM)        Current Level Of Function:   PT Gait Distance: 30 feet Gait: Assistance Level: Minimal Assist, x2 People (for guidance of walker and balance) Gait: Assistive Device: Roller Walker  Bed Mobility/Transfers  Bed Mobility: Supine to Sit: Moderate Assist, Assist with Trunk, Head of Bed Elevated, No Rail  Transfer Type: Sit to/from Stand  Transfer: Assistance Level: To/From, Bed, Minimal Assist, x2 People  Transfer: Assistive Device: Nurse, adult  Transfers: Type Of Assistance: For Balance, For Safety Considerations  End Of Activity Status: Up in Chair, Nursing Notified, Instructed Patient to Request Assist with Mobility, Instructed Patient to Use Call Light (pads alarm activated)   OT  Where Assessed: Standing at [employer] Assist: Minimal Assist  Grooming Deficits: Steadying;Teeth Care;Wash/Dry Face;Wash/Dry Hands   SLP SWALLOW EVALUATION SUMMARY           Swallow Recommendations*  PO: Ice chips only  Plan: 3-5 x/week  Prognosis: Fair     Review of Systems     Review of systems was negative except for: that noted in the HPI    Physical Exam     BP: 136/85 (05/09 0943)  Temp: 36.6 ?C (97.9 ?F) (05/09 7564)  Pulse: 82 (05/09 0943)  Respirations: 18 PER MINUTE (05/09 0943)  SpO2: 99 % (05/09 0943)  O2 Device: None (Room air) (05/09 0943)  Height: 165.1 cm (5' 5) (05/08 1257)  Body mass index is 24.46 kg/m?Aaron Aas     Head: Normocephalic and atraumatic.   ENT: Conjunctivae normal.   Cardiovascular: Extremities  well perfused.   Pulmonary/Chest: Effort normal. No respiratory distress.   Abdominal: Soft.  Non-distended.   Skin: Skin is warm and dry.   Extremities: no peripheral edema   Psychiatric: Mood and affect normal.    Neuro:   Root Right Left   Elbow Flexion C5 5 5   Wrist Extension C6 5 5   Elbow Extension C7 5 5   Finger Flexion C8 5 5   Finger Abduction T1 4 4   Hip Flexion L2 5 5   Knee Extension L3 5 5   Dorsiflexion L4 5 5   EHL Extension  L5 5 5   Plantarflexion S1 5 5     Neuro:  Cranial Nerves Minimal right facial droop, left gaze deviation, does not go past midline to right gaze, diplopia, no tongue deviation, and sensation is intact.    DTR's no hyperreflexia   Babinski Equivocal Bilaterally   Hoffman Negative bilaterally   Finger to Nose Normal without ataxia/dysmetria   Heel to Shin Normal and symmetric   Upper Extremity Tone Normal   Lower Extremity Tone Normal   Upper Extremity Sensation Intact to light touch bilaterally   Lower Extremity Sensation Intact to light touch bilaterally   Clonus Negative Bilaterally   Memory/Concentration  Dysarthria, comprehension/expression intact. Unable to do serial of 7s or spell world backwards. Unable to recall 3 objects. Able to recall home set up and prior level of function patient seen and examined at bedside this morning with daughter present., non-tangential        Intake/Output Summary (Last 24 hours) at 10/18/2023 1132  Last data filed at 10/18/2023 0943  Gross per 24 hour   Intake 0 ml   Output 1000 ml   Net -1000 ml       Labs reviewed as below:   Hematology:    Lab Results   Component Value Date    HGB 14.4 10/18/2023    HGB 13.7 11/13/2020    HCT 41.4 10/18/2023    HCT 39.7 11/13/2020    PLTCT 191 10/18/2023    PLTCT 174 11/13/2020    WBC 11.60 10/18/2023    WBC 6.7 11/13/2020    NEUT 59.3 10/17/2023    NEUT 66 11/13/2020    ANC 4.00 10/17/2023    ANC 4.37 11/13/2020    ALC 2.10 10/17/2023    ALC 1.77 11/13/2020    MONA 6.9 10/17/2023    MONA 6 11/13/2020    AMC 0.50 10/17/2023    AMC 0.42 11/13/2020    EOSA 2.2 10/17/2023    EOSA 1 11/13/2020    ABC 0.10 10/17/2023    ABC 0.06 11/13/2020    MCV 86.9 10/18/2023    MCV 87.8 11/13/2020    MCH 30.1 10/18/2023 MCH 30.4 11/13/2020    MCHC 34.7 10/18/2023    MCHC 34.6 11/13/2020    MPV 9.2 10/18/2023    MPV 9.2 11/13/2020    RDW 13.0 10/18/2023    RDW 12.7 11/13/2020   , Coagulation:    Lab Results   Component Value Date    PT 13.2 06/05/2023    PTT 27.6 10/17/2023    PTT 28.8 02/22/2019    INR 1.0 10/17/2023    INR 1.2 06/05/2023    INR 1.1 02/22/2019    and General Chemistry:    Lab Results   Component Value Date    NA 138 10/18/2023    NA 135 11/13/2020    K 4.4 10/18/2023  K 3.9 11/13/2020    CL 104 10/18/2023    CL 101 11/13/2020    CO2 24 10/18/2023    CO2 23 11/13/2020    GAP 10 10/18/2023    GAP 11 11/13/2020    BUN 11 10/18/2023    BUN 13 11/13/2020    CR 0.65 10/18/2023    CR 1.17 11/13/2020    GLU 264 10/18/2023    GLU 411 11/13/2020    CA 9.1 10/18/2023    CA 8.5 11/13/2020    ALBUMIN 4.1 10/17/2023    ALBUMIN 4.2 11/13/2020    LACTIC 1.2 08/31/2015    MG 1.7 10/18/2023    MG 2.0 09/01/2015    TOTBILI 0.7 10/17/2023    TOTBILI 0.3 11/13/2020        Radiology:    Reviewed  CT head 5/8  IMPRESSION       1. No acute intracranial hemorrhage or mass effect.   2.  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   3.  Additional cerebral hypodensities, likely secondary to chronic   microvascular ischemic changes.     MRI brain 5/9    1.  Small acute right pontomedullary brainstem infarct   2.  Stable old left basal ganglia infarct and old left thalamic and right   supratentorial white matter lacunar infarcts.   3.  Stable generalized cerebral volume loss and moderate patchy   supratentorial white matter FLAIR hyperintensities, likely due to chronic   microvascular ischemia.   4.  Intracranial right vertebral stenotic-occlusive disease, better   assessed on CTA.

## 2023-10-18 NOTE — Progress Notes
 SPEECH-LANGUAGE PATHOLOGY  DAILY TREATMENT NOTE    Treatment Summary:   Dysphagia therapy completed.     Moderate-severe oropharyngeal dysphagia  Sources of Dysphagia:  (Weakness from suspected CVA), suspect degree of incoordination  Persons Educated: Patient  Prognosis: Fair    Swallow Recommendations  PO: Ice chips only     Medications: Crushed in puree (contact SLP if s/s aspiration present w/ medication administration - may need alternate source)  Ice Chip Trials: Unlimited  Supervision: 1:1  Positioning: Upright as tolerated     Oral Hygiene: Complete oral care to minimize the risk of aspirating oral bacteria              Goal : Pt will participate in ongoing swallow evaluation w/ min cues for participation.  Met  Continue to address this goal    Goal : Pt will participate in speech/language or cognitive communication evaluation as appropriate pending CVA workup.  Not addressed  Comment: Focus on nutrition plan this date  Continue to address this goal      PO Presentations  Presentations: Therapist Fed, Patient Fed Self  Thin Liquid: 1 Tsp, Cup, Straw  Mildly Thick Liquid: Straw     Other Consistencies: Ice chips, Puree, Regular solids, Pill    Oral Stage  Withdraw Bolus: WFL  Form Bolus: Slowed  Masticate Bolus: Edentulous  Transfer Bolus: Suspect early spillover  Anterior Bolus Spillage: None  Oral Residue:  (Whole pill following multiple swallows and liquid wash)          Pharyngeal Stage  Swallow Initiation:  (Present)  Laryngeal Elevation:  (Palpable)  Signs / Symptoms Of Aspiration: Thin liquid, Puree, Regular solid  Thin Liquid: Cough  Puree: Cough     Regular Solid: Throat clear  Suspected Pharyngeal Stage Impairment: Decreased laryngeal vestibule closure, Incomplete pharyngeal clearance          Plan: PO trials  Frequency: 3-5x/week      Therapist: Raymondo Calin, SLP MA, [facility] Voalte# [phone]  Date: 10/18/2023

## 2023-10-18 NOTE — Rehab Pre-Admission Screening
 Physical Medicine & Rehabilitation Pre-Admission Screening    [facility] Kevin Obrien is a 73 y.o.  male.     DOB: Oct 27, 1950                  MRN#:  [id number]  Primary Insurance: [employer] MEDICARE  Secondary Insurance: [place] MEDICAID  Financial Class: Medicare Repl  Date of Hospital Admission:  10-17-2023  Date of Expected Rehab Admission:  10-19-2023  Precautions:  Fall  Weight bearing Precautions:  [facility] Course:       [name] is a 73 y.o. male and  has a past medical history of Accidental fall, Angina pectoris, Arthritis, BPH (benign prostatic hyperplasia), Cataract, Constipation, Coronary artery disease, DM (diabetes mellitus) (CMS-HCC), ED (erectile dysfunction), Heart attack (CMS-HCC), Hyperlipidemia, Hypertension, Joint pain, Neuropathy, and Shingles. The patient presented to [facility] on 10/17/2023 with left gaze deviation, facial weakness and dysarthria.  CT head was negative for acute changes.  CTA head and neck without LVO.  Patient was not a TNK or EVT candidate. 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 Neurology recommend DAPT with aspirin and Plavix.  He is currently on permissive hypertension. The patient's hospital course is functionally complicated by impaired mobility and ADLs.      Patient has been working with physical and occupational therapy since 10-18-2023 and has been making functional gains. Patient is anticipated to return to home at the supervision level of care. Patient has also been working with speech language pathology on dysphagia; however will also receive formal cognitive evaluation to assess for baseline status and any new cognitive/linguistic deficits.             Prior Level of Function        Self-Care/ADLs: Independent with ADL and household mobility with device   Mobility: Independent with ADL and household mobility with device   Work/Personal Responsibilities/Hobbies:  n/a    Home Environment:    Type of Home: House (10/18/2023 12:00 PM)   Entry Stairs: 2 (10/18/2023 12:00 PM)   In-Home Stairs: Able to live on main level (10/18/2023 12:00 PM)        Support System:  Spouse / Significant Other  Other DME:  SPC       Current Level of Function    Physical Therapy: 10-18-2023  ROM  ROM Comments: BLEs WFLs.     Strength  Overall Strength: Able to move all joints independently through available ROM     Bed Mobility/Transfer  Bed Mobility: Supine to Sit: Moderate Assist;Assist with Trunk;Head of Bed Elevated;No Rail  Transfer Type: Sit to/from Stand  Transfer: Assistance Level: To/From;Bed;Minimal Assist;x2 People  Transfer: Assistive Device: Nurse, adult  Transfers: Type Of Assistance: For Balance;For Safety Considerations  End Of Activity Status: Up in Chair;Nursing Notified;Instructed Patient to Request Assist with Mobility;Instructed Patient to Use Call Light (pads alarm activated)     Gait  Gait Distance: 30 feet  Gait: Assistance Level: Minimal Assist;x2 People (for guidance of walker and balance)  Gait: Assistive Device: Roller Walker  Gait: Descriptors: Pace: Slow;Loss of balance;Variable step length    Occupational Therapy: 10-18-2023  Vision  Comment: left gaze with difficulty crossing midline. patient's daughter reports patient has c/o blurred and double vision. neurology team present to assess visual deficits during therapy session     ADL's  Where Assessed: Standing at Sink  Grooming Assist: Minimal Assist  Grooming Deficits: Steadying;Teeth Care;Wash/Dry Face;Wash/Dry Hands  ADL Mobility  Bed Mobility: Supine to Sit: Moderate assist  Transfer Type: Sit to stand  Transfer: Assistance Level: From;Bed;Minimal assist;x2 people  Transfer: Assistive Device: Agricultural consultant: Type of Assistance: Elevated bed;For balance;For safety considerations  Other Transfer Type: Stand to sit  Other Transfer: Assistance Level: To;Bedside chair;Moderate assist;of 1st person;Minimal assist;of 2nd person  Other Transfer: Assistive Device: Nurse, adult  Other Transfer: Type of Assistance: Verbal cues;For balance;For safety considerations  End of Activity Status: Up in chair;Instructed patient to request assist with mobility;Instructed patient to use call light;Nursing notified (chair alarm on, all needs met)  Transfer Comments: patient with L sided lean and difficulty with maneuvering RW for stand>sit transfers, required increased assist and verbal cueing to align hips prior to sitting in chair.  Sitting Balance: Standby assist;Minimal assist (intermittent assist for steadying)  Standing Balance: 2 UE support;Minimal assist  Gait Distance: 30 feet  Gait: Assistance Level: Minimal assist;x2 people  Gait: Assistive Device: Roller walker  Gait Comments: assist for RW management and balance.     Activity Tolerance  Endurance: 3/5 Tolerates 25-30 Minutes Exercise w/Multiple Rests     Cognition  Expression: Expressive Aphasia;Mumbling/Slurred  Social Interaction: Interacts in a Network engineer  Problem Solving: Cueing to Sequence Task;Decreased Judgment/Safety  Attention: Awake/Alert     ROM  R UE ROM: WFL   R UE ROM Method: Active  L UE ROM: WFL   L UE ROM Method: Active  ROM Comments: BLEs WFLs.     Sensory  Overall Sensory: Pt Perceives Pressure in Both UEs in Gross Exam     Strength / Tone  R UE Strength: WFL  L UE Strength: WFL       Speech Therapy: 10-18-2023  EVALUATION SUMMARY   A timeout procedure was completed prior to assessment confirming two patient identifiers.   Reviewed nature of procedure and clarified possible dietary restrictions before PO trials provided.      Fiberoptic endoscopic evaluation of swallowing (FEES) completed.  Utilized AMBU Single patient use scope. Lot #: [id number]  Overall Impression: Moderate-severe oropharyngeal dysphagia  Sources of Dysphagia: impaired motor processing/incoordination w/ a component of weakness     Swallow Recommendations  PO: Ice chips only w/ excellent oral care to minimize potential of aspirating oral bacteria   NPO: Consider short term non-oral nutrition  Medications: non-oral   Supervision: yes when taking ice  Dysphagia Management: dysphagia tx & anticpate need for repeat instrumental swallow eval given time/videoswallow study      Position: Sitting upright at 90 degrees  Scope Passage: Right nares  Noms Dysphagia Rating: 2- Moderately-Severe Dysphagia -Not able to swallow safely by mouth for nutrition/hydration but may take some consistency w/ consistent max cues in therapy only. Alternative method of feeding required.      Anatomy/Physiology Observation Comment   Secretion Management WNL      Base of Tongue Abnormal During structured speech tasks impaired ROM observed   Valleculae WNL     Epiglottis WNL     True Vocal Folds WNL;      False Vocal Folds WNL;      Arytenoids WNL;      Pyriform Sinuses WNL;      Pharyngeal Walls Abnormal; Left and Right During structured speech tasks impaired ROM observed              Rehabilitation Plan    Rehab Diagnosis and conditions that require Rehabilitation:      stroke   Cognitive impairment, Dysphagia, Pain,  and Weakness       Active Comorbidities/Risk of Medical Complications:     Stroke: 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 Neurology recommend DAPT with aspirin and Plavix.  Cognitive Impairment: Patient with cognitive impairment.  May need 24 hour supervision upon discharge home.  Dysphagia: Patient with moderate to severe oropharyngeal dysphagia. Currently tolerating corpak with TF's plus ice chips only.  Dysphagia puts them at risk for aspiration and pneumonia.  Aspiration precautions in place.  Falls:  Patient will be placed close to the nurse's station for closer monitoring. Staff will round on the patient frequently to assess for any needs, such as using the restroom to help prevent falls as this increases the patient's risk for morbidity/mortality and 30 day readmission to the hospital rate.   Diabetes: Patient is at risk for hyper/hypoglycemia during aggressive mobilization with therapies, and will need daily physician monitoring and adjustment of medications.  Patient may benefit from ongoing education from diabetes nurse educator for healthy lifestyle modifications.  Hyperlipidemia: Will continue to monitor and manage, and adjust medications as needed.  Hypertension: Patient is at risk for hyper/hypotension during aggressive mobilization program with therapies, and requires daily physician monitoring and adjustment of medications.  Will continue to monitor and manage for optimal blood pressure control and resume home medications as able.  Leukocytosis: Patient is at risk for SIRS, sepsis and needs daily physician monitoring and management of acute illness. Will continue to monitor and manage.  Risk for Pain: There is a risk of uncontrolled pain, risk of failure to participate in therapies, and risk of sedation due to pain medications.  This will require daily medication adjustments to find the balance between meaningful participation in therapies and pain control, avoid the potential for addiction, while facilitating the rehabilitation for their condition. Will continue to monitor and adjust medication regimen in order to maximize pain relief.  Risk for Thrombosis: Patient has Lovenox, sequential compression devices ordered.  Patient will be encouraged to ambulate frequently with the assistance of health care provider.  Risk for Stroke: Will monitor for acute neurological changes and manage accordingly, as patient is at risk for repeat stroke, hemorrhagic conversion, and seizures.            Patient will receive Physical therapy, Occupational therapy, and Speech therapy each 60 minutes a day, 5 days a week for a total of 3 hours daily (minimum) for the duration of the rehabilitation stay within an interdisciplinary rehabilitation program with case manager/social worker, dietician, neuropsychologist, rehab nursing and PM&R oversight.    Rehabilitation Prognosis: Fair to good  Medical Prognosis: The patient is deemed medically stable to tolerate, benefit from, and participate in IRF level services.  Tolerance for three hours of therapy a day: Good. Patient has participated well with therapies since 10-18-2023 in the acute care setting and is anticipated to tolerate therapy as required.     Goals/Barriers/Facilitators  Family / Patient Goals: return home with family assistance  Mobility Goals: Overall goal is Supervision or touching assistance and Physical Therapy will evaluate and treat ambulation/wheelchair use and bed transfers  Activities of Daily Living (ADLs) Goals: Overall goal is Supervision or touching assistance and Occupational therapy will evaluate and treat basic Activities of Daily Living  Cognition / Communication Goals: Overall goal is Supervision or touching assistance and Speech therapy will evaluate and treat cognition and communication deficits and assess for safe swallow         Barriers & Interventions:   Caregiver Apprehension:  Arrange caregiver support and discuss barriers and patient progress with caregivers when appropriate.  High Burden of Care: Initiate interdisciplinary rehabilitation to improve functional independence and reduce burden of care.  Medication Education:  Pharmacist and nursing staff to provide education to patient and family regarding medication side effects, special precautions, and safe administration.  Facilitators: good home setup, good family / social support, patient motivation, improving strength / endurance, and improving medical condition    Discharge Planning  Expected Length of Stay  21  day(s)  Expected Discharge Disposition Home          Catherene Close, BSN, RN

## 2023-10-18 NOTE — Progress Notes
 Neurology Progress Note      Kevin Obrien  Admission Date: 10/17/2023  LOS: 1 day     Assessment/Plan:  Principal Problem:    Ischemic stroke (CMS-HCC)      [facility] is a 73 y.o. male with hypertension, diabetes and prior CAD s/p PCI (~10 years ago) presented with acute right facial droop, left gaze and dysarthria and was stroke activated. Initial NIH was 6, and the LKW was the night before. His BP at arrival was 210/85. CT head was negative for acute changes, CTA H&N showed no LVO but significant calcified athero and diffuse intracranial vascular stenoses.  He did not receive TNK due to being outo f window and no EVT due to no LVO.    He was loaded with both Aspirin and Plavix and plan is to do DAPT for 3 months. A1c was 11.7% and lipid panel consistent with a total cholesterol of 216, trigly 219, and LDL of 161.     TTE showed a PFO with shunt, but ultrasound of the lower extremities was without DVT.   MRI brain showed a right sided pontomedullary brainstem infarct.  In light of the results of the MRI, all his clinical symptoms (dysphagia, facial weakness, gaze deviation) can be explained with the stroke location.        #Acute R pontomedullary infarct  #Hx of CAD s/p PCI  #Diffuse intracranial athero  #Right sided facial weakness (upper and lower)  #Forced left gaze deviation  #Dysphagia      CT Head  1.  No acute intracranial hemorrhage or mass effect.   2.  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   3.  Additional cerebral hypodensities, likely secondary to chronic microvascular ischemic changes.     CTA head  1.  Multifocal stenosis of the anterior, middle, and posterior cerebral arteries most likely secondary to significant intracranial atherosclerosis. No large vessel central occlusion is identified   2.  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     CTA neck  1.  Bilateral atherosclerotic plaque at the carotid bulbs and origins of the ICAs without significant/high-grade stenosis of the ICAs by NASCET criteria.   2. 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   3. 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     MRI Brain  1.  Small acute right pontomedullary brainstem infarct   2.  Stable old left basal ganglia infarct and old left thalamic and right supratentorial white matter lacunar infarcts.   3.  Stable generalized cerebral volume loss and moderate patchy supratentorial white matter FLAIR hyperintensities, likely due to chronic microvascular ischemia.   4.  Intracranial right vertebral stenotic-occlusive disease, better assessed on CTA.       PLAN  - Continue DAPT with Aspirin and Plavix for 3 months, then ASA monotherapy  - Daugther reports that patient has not been taking his medications consistently, hence we will restart them  - Increased Insulin from [facility] to [facility] and will adjust accordingly  - Start Atorvastatin 40 mg daily  - PT/OT/SLP/Rehab  - SLP recommending NGT  - Dispo likely IPR      Providence Brow MD  Neuro PGY-3    Patient seen and discussed with Dr. Sanjuanita Cruz.  ________________________________________________________________________  Subjective:    Kevin Obrien is a 73 y.o. male. Patient was seen with family at bedside.  We went over the results of neuroimaging. Daughter reports that he is not compliant with meds at home. All questions answered.      Objective:                  Vital Signs:  Last Filed                   Vital Signs: 24 Hour Range   BP: 168/74 (05/09 0500)  Temp: 37.2 ?C (99 ?F) (05/09 0500)  Pulse: 80 (05/09 0500)  Respirations: 17 PER MINUTE (05/09 0500)  SpO2: 95 % (05/09 0500)  O2 Device: None (Room air) (05/09 0500)  Height: 165.1 cm (5' 5) (05/08 1257)  BP: (161-179)/(57-84)   Temp:  [36.8 ?C (98.2 ?F)-37.2 ?C (99 ?F)]   Pulse:  [69-80]   Respirations:  [12 PER MINUTE-17 PER MINUTE]   SpO2:  [95 %-99 %]   O2 Device: None (Room air)    Intensity Pain Scale (Self Report): (not recorded)    Intake/Output Summary: (Last 24 hours)    Intake/Output Summary (Last 24 hours) at 10/18/2023 0903  Last data filed at 10/17/2023 2303  Gross per 24 hour   Intake --   Output 1000 ml   Net -1000 ml           Medications:  Scheduled Meds:aspirin chewable tablet 81 mg, 81 mg, Oral, QDAY  clopiDOGreL (PLAVIX) tablet 75 mg, 75 mg, Oral, QDAY  enoxaparin (LOVENOX) syringe 40 mg, 40 mg, Subcutaneous, QDAY(21)  insulin aspart (U-100) (NOVOLOG FLEXPEN U-100 INSULIN) injection PEN 0-12 Units, 0-12 Units, Subcutaneous, 5 X Daily    Continuous Infusions:  PRN and Respiratory Meds:acetaminophen Q4H PRN **OR** acetaminophen Q4H PRN, dextrose 50% (D50) IV PRN       General physical exam:    HEENT: normocephalic, eyes open with no discharge, nares patent, oropharynx is clear with no lesions, palate intact  CV: regular rate and rhythm, no murmur, distal pulses palpable  Chest: normal configuration, lungs are clear bilaterally  Ab: soft, non-tender, no masses, no organomegaly    Neuro exam:   Mental status: alert and awake  Speech:    Normal Abnormal   Fluency x    Comprehension x    Articulation  Slurred speech   Repetition     Naming       Cranial Nerves:    Normal Abnormal   II Pupils reactive, visual fields normal    III, IV, VI  Forced gaze deviation to the left, not reaching midline   V Sensation nml V1-V3    VII  Right sided facial weakness   VIII nml to conversation    IX, X +strong cough    XI Equal shoulder shrug    XII Tongue midline        Muscle/motor:   Tone: nml  Bulk: nml  Fasciculations: none  Pronator drift: none   NF NE SA EF EE WE WF FF FE FA TA HF HA HE KF KE DF PF In Ev TF TE   R   5 5 5   5    5   5 5 5 5        L   5 5 5 5    5   5 5 5 5          Sensation:    Normal RUE LUE  RLE LLE   Light Touch x       Pin Prick x       Temperature x       Vibration >10sec b/l       Proprioception intact       Sensory level: none    Coordination:    Normal Abnormal Right Abnormal Left   Finger to Nose x     Rapid alternating  x     Heel to Shin x     Finger tap x     Foot tap      Other        Gait and Sation:  Seen walking with PT; leaning towards the left. Needing assistance.    Reflexes:    Right Left   Triceps 2 2   Biceps 2 2   Brachioradialis 2 2   Patella 2 2   Ankle 2 2   Plantar       Laboratory Review:  No results found for: Shermon Divine, O2SATACAL  Lab Results   Component Value Date    HGB 14.4 10/18/2023    HCT 41.4 10/18/2023    PLTCT 191 10/18/2023    WBC 11.60 (H) 10/18/2023    NEUT 59.3 10/17/2023    ANC 4.00 10/17/2023     Lab Results   Component Value Date    NA 138 10/18/2023    K 4.4 10/18/2023    CL 104 10/18/2023    CO2 24 10/18/2023    GAP 10 10/18/2023    BUN 11 10/18/2023    CR 0.65 10/18/2023    GLU 264 (H) 10/18/2023    CA 9.1 10/18/2023    MG 1.7 10/18/2023    LACTIC 1.2 08/31/2015    TOTPROT 6.9 10/17/2023    AST 17 10/17/2023    ALT 20 10/17/2023    ALKPHOS 55 10/17/2023     Lab Results   Component Value Date    TNI 0.00 02/22/2019     Lab Results   Component Value Date    PTT 27.6 10/17/2023    INR 1.0 (L) 10/17/2023    DIMER 254 02/22/2019     Lab Results   Component Value Date    TSH 0.40 06/05/2023     Lab Results   Component Value Date    CHOL 216 (H) 10/17/2023    TRIG 219 (H) 10/17/2023    HDL 36 (L) 10/17/2023    LDL 161 (H) 10/17/2023    VLDL 43.8 10/17/2023     No results found for: VANPK, VANTR  No results found for: CYCLOSPOR, TACROLIMUS, FREEPHENY     Point of Care Testing:  (Last 24 hours):  Glucose: (!) 264  POC Glucose (Download): (!) 247    Radiology and Other Diagnostics Reviewed    Providence Brow, MD   Neurology Resident

## 2023-10-18 NOTE — Progress Notes
 Small Bore Feeding Tube Placement    Procedure was discussed with patient and family    Time Placed: 1530    Size of Tube: 12Fr  Length of Tube: 109cm     Physician's order received for feeding tube placement. Placed small bore feeding tube in patient's left nare with Cortrak tracking device. 1 attempt/s made to place tube. Tube advanced to 53cm centimeter mark on tube, and secured with nasal bridle to the patient's nose. Nurse to confirm correct tube placement via KUB with primary team physician. Procedure completed without complications.

## 2023-10-19 ENCOUNTER — Inpatient Hospital Stay: Admit: 2023-10-19 | Discharge: 2023-10-19 | Payer: MEDICARE

## 2023-10-19 LAB — POC GLUCOSE
~~LOC~~ BKR POC GLUCOSE: 155 mg/dL — ABNORMAL HIGH (ref 70–100)
~~LOC~~ BKR POC GLUCOSE: 212 mg/dL — ABNORMAL HIGH (ref 70–100)
~~LOC~~ BKR POC GLUCOSE: 250 mg/dL — ABNORMAL HIGH (ref 70–100)
~~LOC~~ BKR POC GLUCOSE: 260 mg/dL — ABNORMAL HIGH (ref 70–100)
~~LOC~~ BKR POC GLUCOSE: 257 mg/dL — ABNORMAL HIGH (ref 70–100)

## 2023-10-19 MED ORDER — MAGNESIUM OXIDE 400 MG (241.3 MG MAGNESIUM) PO TAB
400 mg | Freq: Once | ORAL | 0 refills | Status: CP
Start: 2023-10-19 — End: ?
  Administered 2023-10-19: 16:00:00 400 mg via ORAL

## 2023-10-19 MED ORDER — PANCRELIPASE-SODIUM BICARBONATE 20,880 K UNIT-650 MG
GASTROSTOMY | 0 refills | Status: DC | PRN
Start: 2023-10-19 — End: 2023-10-28
  Administered 2023-10-23 – 2023-10-25 (×4): via GASTROSTOMY

## 2023-10-19 MED ORDER — INSULIN GLARGINE 100 UNIT/ML (3 ML) SC INJ PEN
15 [IU] | Freq: Every day | SUBCUTANEOUS | 0 refills | Status: DC
Start: 2023-10-19 — End: 2023-10-20
  Administered 2023-10-19: 23:00:00 15 [IU] via SUBCUTANEOUS

## 2023-10-19 MED ORDER — THIAMINE MONONITRATE (VIT B1) 100 MG PO TAB
100 mg | Freq: Every day | NASOGASTRIC | 0 refills | Status: DC
Start: 2023-10-19 — End: 2023-10-28
  Administered 2023-10-19 – 2023-10-28 (×9): 100 mg via NASOGASTRIC

## 2023-10-19 MED ORDER — INSULIN ASPART 100 UNIT/ML SC FLEXPEN
0-12 [IU] | SUBCUTANEOUS | 0 refills | Status: DC
Start: 2023-10-19 — End: 2023-10-21

## 2023-10-19 MED ORDER — NUTRITIONAL SUPPLEMENT (ENAR)
Freq: Every day | 0 refills | Status: DC
Start: 2023-10-19 — End: 2023-10-28

## 2023-10-19 MED ORDER — INSULIN GLARGINE 100 UNIT/ML (3 ML) SC INJ PEN
15 [IU] | Freq: Every evening | SUBCUTANEOUS | 0 refills | Status: DC
Start: 2023-10-19 — End: 2023-10-19

## 2023-10-19 MED ORDER — PHENOL 1.4 % MM SPRA
2 | OROMUCOSAL | 0 refills | Status: DC | PRN
Start: 2023-10-19 — End: 2023-10-28
  Administered 2023-10-19: 18:00:00 2 via OROMUCOSAL

## 2023-10-19 MED ORDER — POTASSIUM CHLORIDE 20 MEQ/15 ML PO LIQD
40 meq | Freq: Once | ORAL | 0 refills | Status: CP
Start: 2023-10-19 — End: ?
  Administered 2023-10-19: 16:00:00 40 meq via ORAL

## 2023-10-19 MED ORDER — AMLODIPINE 5 MG PO TAB
5 mg | Freq: Every day | NASOGASTRIC | 0 refills | Status: DC
Start: 2023-10-19 — End: 2023-10-20
  Administered 2023-10-19 – 2023-10-20 (×2): 5 mg via NASOGASTRIC

## 2023-10-19 MED ORDER — TUBE FEED (ENAR)
0 refills | Status: DC
Start: 2023-10-19 — End: 2023-10-28

## 2023-10-19 NOTE — Consults
 Endocrinology Note   Name:  Kevin Obrien MRN:  [id number] Admission Date:  10/17/2023    Principal Problem:    Ischemic stroke (CMS-HCC)  Active Problems:    Diabetes mellitus (CMS-HCC)    Neuropathy    Peripheral artery disease    Essential tremor    Poorly controlled diabetes mellitus (CMS-HCC)    Gaze preference    Moderate malnutrition      Reason for Consult:     10/19/23 1048  CONSULT ENDOCRINOLOGY PHYSICIAN  ONCE        Priority: Routine   Ordering Provider: Vannie Gentile, APRN-NP      Provider: (Not yet assigned)   Question Answer Comment   Reason for consult high A1C, no management    Is this consult for diabetes management? Yes    Consult Type: Co-Management w/Signed Orders    Ordering Provider's or Responsible Teams's Phone/Pager number? Number not on file              Assessment / Plan     Admission date and time: 10/17/2023  8:24 AM     Type 2 Diabetes Mellitus uncontrolled  Non adherance to meds  Acute stroke   Dyslipidemia   Tube feed hyperglycemia   A1c 11.7, 10/17/2023   PTA regimen: non compliant, was supposed to be on meal time insulin. Was taking very sporadically   Was not taking any PO meds either   Hypoglycemic episodes on this regimen: none   Follows up with ? PCP for diabetes management  Diabetic-complications assessment:  CAD, stroke, neuropathy     Diabetes regimen over the past 24 hours.    On high dose correction     Current diet: NPO , started on TF - at 20 cc/hr   Glucocorticoids: none     Glucose values over the past 24 hours:      Recent Labs     10/18/23  0204 10/18/23  0724 10/18/23  1325 10/18/23  1701 10/18/23  2129 10/19/23  0358 10/19/23  0722 10/19/23  1114   GLUPOC 240* 247* 319* 151* 155* 212* 250* 260*         Diabetes Regimen Changes today based on glucose values:    We will start Lantus 15 units daily   We will initiate Q4 hr MDCF and once tube feeds are at goal, we will schedule regular insulin.     Goal BG 140-180.       Diabetes Plans Post Discharge    Diabetes regimen: TBD  He will most likely need insulin on discharge.   We will request for outpatient follow-up.       Subjective:      Kevin Obrien is a 73 y.o. male with history of hypertension, diabetes, coronary artery disease status post PCI presented with an acute stroke.  History of nonadherence to medications.   Endocrinology is consulted for diabetes management.     Hemoglobin A1c on admission 11.7.     History is provided mostly by his daughter he is known diabetic for.  Most of his adult life he has never been compliant with diabetes regimen he had coronary artery disease 10 years ago. Aaron Aas   He was previously on insulin, semaglutide and oral pills but has never been compliant.   No episodes of hypoglycemia ever.     He follows with PCP for his diabetes management.       Past Medical History:    Accidental fall  Angina pectoris    Arthritis    BPH (benign prostatic hyperplasia)    Cataract    Constipation    Coronary artery disease    DM (diabetes mellitus) (CMS-HCC)    ED (erectile dysfunction)    Heart attack (CMS-HCC)    Hyperlipidemia    Hypertension    Joint pain    Neuropathy    Shingles     Surgical History:   Procedure Laterality Date    CORONARY STENT PLACEMENT  2016    HX CHOLECYSTECTOMY  09/2015    COLONOSCOPY N/A 04/20/2016    Performed by Karalee Oscar, MD at [facility] ENDO    ESOPHAGOGASTRODUODENOSCOPY N/A 04/20/2016    Performed by Karalee Oscar, MD at [facility] ENDO    ESOPHAGOGASTRODUODENOSCOPY BIOPSY  04/20/2016    Performed by Karalee Oscar, MD at [facility] ENDO    UREA BREATH TEST N/A 11/13/2016    Performed by Dearl Fabry, MD at [facility] ENDO     Family history reviewed; non-contributory  Social History     Tobacco Use    Smoking status: Former     Current packs/day: 0.00     Average packs/day: 2.0 packs/day for 10.0 years (20.0 ttl pk-yrs)     Types: Cigarettes     Start date: 04/11/1961     Quit date: 04/12/1971     Years since quitting: 52.5    Smokeless tobacco: Never    Tobacco comments:     Former smoker/quit years ago   Vaping Use    Vaping status: Never Used   Substance and Sexual Activity    Alcohol use: Not Currently    Drug use: Never    Sexual activity: Not Currently     Partners: Female     Birth control/protection: None          Review of Systems:   Negative except per HPI     Physical Exam:     General:  Alert, cooperative, no distress  Head:  Normocephalic, Atraumatic  Eyes:  No scleral icterus  Ext: warm, no pedal edema  Lungs:  symmetric chest rise, no tachypnea, CTA b/l  Heart:  normal pulse, regular S1+S2  Abdomen:  soft, non tender, non distended  Skin:   No rashes  Neurologic:  dysarthria , facial droop     Vital Signs: Last Filed In 24 Hours Vital Signs: 24 Hour Range   BP: 150/73 (05/10 1421)  Temp: 37.2 ?C (98.9 ?F) (05/10 1421)  Pulse: 101 (05/10 1421)  Respirations: 20 PER MINUTE (05/10 1421)  SpO2: 97 % (05/10 1421)  O2 Device: None (Room air) (05/10 1421) BP: (143-190)/(68-88)   Temp:  [36.2 ?C (97.2 ?F)-37.2 ?C (98.9 ?F)]   Pulse:  [83-104]   Respirations:  [16 PER MINUTE-20 PER MINUTE]   SpO2:  [97 %-98 %]   O2 Device: None (Room air)            Medications:  No current facility-administered medications on file prior to encounter.     No current outpatient medications on file prior to encounter.           Lab Results   Component Value Date    URMALBCRRAT 285.85 (H) 05/06/2017    TSH 0.40 06/05/2023       Chemistry and Blood Count Laboratory Flow sheet  Comprehensive Metabolic Profile        Latest Ref Rng & Units 08/30/2023    12:30 PM 10/17/2023     8:27 AM 10/17/2023  8:28 AM 10/18/2023     5:06 AM 10/19/2023     5:55 AM   CMP   Sodium 137 - 147 mmol/L  137   138  138    Potassium 3.5 - 5.1 mmol/L  3.3   4.4  3.8    Chloride 98 - 110 mmol/L  100   104  102    CO2 21 - 30 mmol/L  28   24  23     Anion Gap 3 - 12  9   10  13     Blood Urea Nitrogen 7 - 25 mg/dL  16   11  11     Creatinine 0.40 - 1.24 mg/dL 0.7  9.62  0.8  9.52  8.41    Glucose 70 - 100 mg/dL  324   401  027    Calcium 8.5 - 10.6 mg/dL  9.3   9.1  9.2    Total Protein 6.0 - 8.0 g/dL  6.9       Albumin 3.5 - 5.0 g/dL  4.1       Alk Phosphatase 25 - 110 U/L  55       ALT (SGPT) 7 - 56 U/L  20       AST 7 - 40 U/L  17       Total Bilirubin 0.2 - 1.3 mg/dL  0.7       GFR >25 mL/min  >60   >60  >60         Complete Blood Count with Differential       Latest Ref Rng & Units 06/09/2023     4:12 AM 08/30/2023    12:28 PM 10/17/2023     8:27 AM 10/18/2023     5:06 AM 10/19/2023     5:55 AM   CBC with Diff   WBC 4.50 - 11.00 10*3/uL 4.40  7.30  6.70  11.60  7.40    RBC 4.40 - 5.50 10*6/uL 4.10  5.11  4.62  4.77  4.90    Hemoglobin 13.5 - 16.5 g/dL 36.6  44.0  34.7  42.5  15.0    Hematocrit 40.0 - 50.0 % 35.7  44.7  39.9  41.4  43.6    MCV 80.0 - 100.0 fL 87.0  87.6  86.5  86.9  89.1    MCH 26.0 - 34.0 pg 31.0  31.2  30.6  30.1  30.6    MCHC 32.0 - 36.0 g/dL 95.6  38.7  56.4  33.2  34.4    RDW 11.0 - 15.0 % 12.5  13.1  12.6  13.0  12.7    Platelet Count 150 - 400 10*3/uL 146  200  177  191  197    MPV 7.0 - 11.0 fL 9.3  9.6  9.4  9.2  9.2    Neurtrophils 41.0 - 77.0 % 49  55.8  59.3      Absolute Neutrophils 1.80 - 7.00 10*3/uL 2.10  4.10  4.00      Absolute Lymph Count 1.00 - 4.80 10*3/uL 1.70  2.50  2.10      Absolute Monocyte Count 0.00 - 0.80 10*3/uL 0.50  0.60  0.50      Eosinophils 0.0 - 5.0 % 1  1.6  2.2      Absolute Eosinophil Count 0.00 - 0.45 10*3/uL 0.00  0.10  0.10      Basophils 0.0 - 2.0 % 1  1.0  1.0  Most recent Lipid Profile Lipid Profile trend   Lab Results   Component Value Date    CHOL 216 (H) 10/17/2023    TRIG 219 (H) 10/17/2023    HDL 36 (L) 10/17/2023    LDL 161 (H) 10/17/2023    VLDL 43.8 10/17/2023    NONHDLCHOL 180 10/17/2023    Lab Results   Component Value Date    CHOL 216 (H) 10/17/2023    CHOL 184 06/05/2023    CHOL 159 09/01/2015    TRIG 219 (H) 10/17/2023    TRIG 207 (H) 06/05/2023    TRIG 299 (H) 09/01/2015    HDL 36 (L) 10/17/2023    HDL 27 (L) 06/05/2023    HDL 27 (L) 09/01/2015    LDL 161 (H) 10/17/2023 LDL 119 (H) 06/05/2023    LDL 84 09/01/2015    VLDL 19.1 10/17/2023    VLDL 41.4 06/05/2023    VLDL 60 09/01/2015    NONHDLCHOL 180 10/17/2023    NONHDLCHOL 157 06/05/2023    NONHDLCHOL 132 09/01/2015          Lab Results   Component Value Date    CHOL 216 (H) 10/17/2023    TRIG 219 (H) 10/17/2023    HDL 36 (L) 10/17/2023    LDL 161 (H) 10/17/2023    VLDL 43.8 10/17/2023    NONHDLCHOL 180 10/17/2023          Lab Results   Component Value Date    A1C 12.2 05/01/2018    A1C 12.2 05/06/2017    A1C 11.9 11/12/2016    HGBA1C 11.7 (H) 10/17/2023    HGBA1C 11.9 (H) 06/05/2023    HGBA1C 13.6 (H) 12/04/2017         BMI Readings from Last 8 Encounters:   10/17/23 24.46 kg/m?   08/30/23 22.38 kg/m?   06/06/23 22.96 kg/m?   03/06/23 21.79 kg/m?   01/29/22 23.34 kg/m?   01/08/22 23.34 kg/m?   08/02/21 23.42 kg/m?   11/13/20 21.63 kg/m?       Wt Readings from Last 8 Encounters:   10/17/23 66.7 kg (147 lb)   08/30/23 61 kg (134 lb 7.7 oz)   06/06/23 62.6 kg (138 lb)   03/06/23 61.2 kg (135 lb)   01/29/22 67.6 kg (149 lb)   01/08/22 67.6 kg (149 lb)   08/02/21 67.8 kg (149 lb 8 oz)   11/13/20 59 kg (130 lb)

## 2023-10-19 NOTE — Case Management (ED)
 CMA Note:       Received a request from Sharlon Deacon, [facility] to check for auth for IPR. Called two #'s provided and both stated [facility] is closed.     Claudetta Cuba  Case Management Assistant  For additional assistance please contact SWCM *

## 2023-10-19 NOTE — Consults
 CLINICAL NUTRITION                                                        Clinical Nutrition Initial Assessment    Name: Kevin Obrien   MRN: [id number]     DOB: [birth year]      Age: 73 y.o.  Admission Date: 10/17/2023     LOS: 2 days     Date of Service: 10/19/2023        Recommendation:  REC continuous enteral nutrition of Jevity 1.5 with goal rate of 29mL/hr (total 1080mL/day) + 1pks of prosource protein.     Initiate at 34mL/hr and advance by 10mL q8h until at goal rate.     At goal provides 1700kcal, 89g protein, and free H2O.     Additional free water flush per primary team, recommend at least 30mL q4h for tube patency. Maintenance fluid flushes if desired are q4h.    May be at risk for refeeding syndrome: Start 100mg  thiamine daily x 3-5 days. Monitor Mg, Phos, K+ closely and replace PRN with goal to keep K+ greater than 4, Phos greater than 3 & Mg greater than 2.        The nutrition-related order modifications are made in communication with the primary service, who remains responsible for the orders and overall care of the patient.    Comments:  Kevin Obrien is a 73 y.o. male with hypertension, diabetes and prior CAD s/p PCI (~10 years ago) presented with acute right facial droop, left gaze and dysarthria and was stroke activated.     Clinical nutrition consulted for tube feed recommendations. Pt failed swallow eval, corpak placed 5/10 with tip overlying the gastric outlet or/first portion of the duodenum. RD visited pt at bedside, family members present in room. Pt reports he has not eaten anything in 3 days. Reports good appetite prior to 3 days ago. Denies n/v. Reports diarrhea at baseline, does not take any anti-diarrhea medications at home. Reports ~20-25lb unintentional wt loss over the last year. Wt appears stable x 1 year per chart review however noted no wt source for admit wt. Moderate subcutaneous fat loss, muscle wasting identified during physical assessment. Pt meets criteria for moderate malnutrition; see malnutrition assessment below. May be at risk for refeeding syndrome, will add thiamine. RD discussed the role of enteral nutrition to meet 100% of needs while pt unable to eat orally. Labs/meds reviewed. Pt at acute nutrition risk, RD will continue to follow.    Nutrition Assessment of Patient:  Admit Weight: 66.7 kg (no wt source)  BMI (Calculated): 24.46      Pertinent Allergies/Intolerances: NKFA     Oral Diet Order: NPO      Current Energy Intake: NPO           Weight Used for Calculation: 66.7 kg  Estimated Calorie Needs: 1668-2001kcal (25-30kcal/kg)  Estimated Protein Needs: 80-93g (1.2-1.4g/kg)    Malnutrition Assessment:   Malnutrition present on admission  ICD-10 code E44: Chronic illness/Moderate non-severe malnutrition      Mild loss of body fat, Mild loss of muscle mass            Malnutrition Interventions: EN recs to meet 100% of needs    Nutrition Focused Physical Exam:  Loss of Subcutaneous Fat: Yes; Severity: Mild; Location:  Orbital, Triceps  Muscle Wasting: Yes; Severity: Moderate; Location: Clavicle, Temple, Deltoid  Physical Assessment:     Ascites: No  Pressure Injury: none documented     Comment: LBM PTA    Nutrition Diagnosis:  Inadequate oral intake  Etiology: swallowing difficulty  Signs & Symptoms: NPO status, need for enteral nutrition                      Intervention / Plan:  EN recs to meet 100% of needs  Monitor EN rate/tolerance, weights, labs, meds, GI fxn           Theodis Fiscal, MS, RD, LD  Clinical Dietitian

## 2023-10-19 NOTE — Progress Notes
 Vascular Neurology Progress Note      Kevin Obrien  Admission Date: 10/17/2023  LOS: 2 days     Assessment/Plan:  Principal Problem:    Ischemic stroke (CMS-HCC)  Active Problems:    Diabetes mellitus (CMS-HCC)    Neuropathy    Peripheral artery disease    Essential tremor    Poorly controlled diabetes mellitus (CMS-HCC)    Gaze preference      Kevin Obrien is a 73 y.o. male with hypertension, diabetes and prior CAD s/p PCI (~10 years ago) presented with acute right facial droop, left gaze and dysarthria and was stroke activated. Initial NIH was 6, and the LKW was the night before. His BP at arrival was 210/85. CT head was negative for acute changes, CTA H&N showed no LVO but significant calcified athero and diffuse intracranial vascular stenoses.  He did not receive TNK due to LKW time and no EVT due to no LVO.  He was loaded with Aspirin and Plavix  with plan for DAPT for 3 months. A1c was 11.7%, endocrine consulted.  ECHO showed a PFO with shunt, but ultrasound of the lower extremities was without DVT. MRI brain showed a right sided pontomedullary brainstem infarct.  In light of the results of the MRI, all his clinical symptoms (dysphagia, facial weakness, gaze deviation) can be explained with the stroke location. He still has corpak, will work with SLP for swallow recs.  Pending d/c to [place] [facility] when medically stable.        Acute R pontomedullary infarct  Hx of CAD s/p PCI  Diffuse intracranial athero  Right sided facial weakness (upper and lower)  Forced left gaze deviation  Dysphagia    Imaging  CT Head  1.  No acute intracranial hemorrhage or mass effect.   2.  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   3.  Additional cerebral hypodensities, likely secondary to chronic microvascular ischemic changes.   CTA head  1.  Multifocal stenosis of the anterior, middle, and posterior cerebral arteries most likely secondary to significant intracranial atherosclerosis. No large vessel central occlusion is identified   2.  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   CTA neck  1.  Bilateral atherosclerotic plaque at the carotid bulbs and origins of the ICAs without significant/high-grade stenosis of the ICAs by NASCET criteria.   2. 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   3. 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   MRI Brain  1.  Small acute right pontomedullary brainstem infarct   2.  Stable old left basal ganglia infarct and old left thalamic and right supratentorial white matter lacunar infarcts.   3.  Stable generalized cerebral volume loss and moderate patchy supratentorial white matter FLAIR hyperintensities, likely due to chronic microvascular ischemia.   4.  Intracranial right vertebral stenotic-occlusive disease, better assessed on CTA.       PLAN 5/10:   > Endocrine consulted for assistance with DM and A1C 11.7  > Dietician consulted for tube feeding recommendations  - Continue DAPT with Aspirin and Plavix for 3 months, then ASA monotherapy  - Daugther reports that patient has not been taking his medications consistently, hence we will restart them  - Increased Insulin from MDCF to HDCF and  will adjust accordingly  - Start Atorvastatin 40 mg daily  - PT/OT/SLP/Rehab  - SLP recommending NGT for now, will re-eval Monday (has already failed one video swallow)  - Dispo likely [place] I[facility]      [name], APRN-NP  Vascular Neurology  Available on Voalte     Patient seen and discussed with Dr. Sanjuanita Cruz.  _______________________________________________________________________    Subjective:  Patient seen this AM,  family at bedside. Daughter at bedside with lots of questions regarding his stroke and feeding plan.  All questions answered.        Objective:                  Vital Signs:  Last Filed                   Vital Signs: 24 Hour Range   BP: 169/72 (05/10 0126)  Temp: 36.7 ?C (98 ?F) (05/10 0126)  Pulse: 94 (05/10 0126)  Respirations: 18 PER MINUTE (05/10 0126)  SpO2: 97 % (05/10 0126)  O2 Device: None (Room air) (05/10 0126)  BP: (136-169)/(65-85)   Temp:  [36.2 ?C (97.2 ?F)-36.9 ?C (98.4 ?F)]   Pulse:  [78-94]   Respirations:  [18 PER MINUTE]   SpO2:  [97 %-99 %]   O2 Device: None (Room air)    Intensity Pain Scale (Self Report): (not recorded)    Intake/Output Summary: (Last 24 hours)    Intake/Output Summary (Last 24 hours) at 10/19/2023 0650  Last data filed at 10/19/2023 0606  Gross per 24 hour   Intake 50 ml   Output 550 ml   Net -500 ml         Medications:  Scheduled Meds:aspirin chewable tablet 81 mg, 81 mg, Per NG tube, QDAY  atorvastatin (LIPITOR) tablet 40 mg, 40 mg, Per NG tube, QDAY  clopiDOGreL (PLAVIX) tablet 75 mg, 75 mg, Per NG tube, QDAY  enoxaparin (LOVENOX) syringe 40 mg, 40 mg, Subcutaneous, QDAY(21)  insulin aspart (U-100) (NOVOLOG FLEXPEN U-100 INSULIN) injection PEN 0-24 Units, 0-24 Units, Subcutaneous, 5 X Daily    Continuous Infusions:  PRN and Respiratory Meds:acetaminophen Q4H PRN **OR** acetaminophen Q4H PRN, dextrose 50% (D50) IV PRN     General physical exam:    HEENT: normocephalic, eyes open with no discharge, nares patent, oropharynx is clear with no lesions, palate intact  CV: regular rate and rhythm, no murmur, distal pulses palpable  Chest: normal configuration, lungs are clear bilaterally  Ab: soft, non-tender, no masses, no organomegaly    Neuro exam:   Mental status: alert and awake  Speech: Fluent, dysarthria   Cranial Nerves:    Normal Abnormal   II Pupils reactive, visual fields normal    III, IV, VI  Forced gaze deviation to the left, not reaching midline   V Sensation nml V1-V3    VII  Right sided facial weakness   VIII nml to conversation    IX, X +strong cough    XI Equal shoulder shrug    XII Tongue midline      Muscle/motor: 5/5 in all   Sensation: Normal, no abnormalities   Coordination:    Normal Abnormal Right Abnormal Left   Finger to Nose x     Rapid alternating  x     Heel to Shin x     Finger tap x     Foot tap      Other        Gait and Sation: Seen walking  with PT; leaning towards the left. Needing assistance.  Laboratory Review:  No results found for: Shermon Divine, O2SATACAL  Lab Results   Component Value Date    HGB 15.0 10/19/2023    HCT 43.6 10/19/2023    PLTCT 197 10/19/2023    WBC 7.40 10/19/2023    NEUT 59.3 10/17/2023    ANC 4.00 10/17/2023     Lab Results   Component Value Date    NA 138 10/19/2023    K 3.8 10/19/2023    CL 102 10/19/2023    CO2 23 10/19/2023    GAP 13 (H) 10/19/2023    BUN 11 10/19/2023    CR 0.79 10/19/2023    GLU 238 (H) 10/19/2023    CA 9.2 10/19/2023    MG 1.7 10/19/2023    LACTIC 1.2 08/31/2015    TOTPROT 6.9 10/17/2023    AST 17 10/17/2023    ALT 20 10/17/2023    ALKPHOS 55 10/17/2023     Lab Results   Component Value Date    TNI 0.00 02/22/2019     Lab Results   Component Value Date    PTT 27.6 10/17/2023    INR 1.0 (L) 10/17/2023    DIMER 254 02/22/2019     Lab Results   Component Value Date    TSH 0.40 06/05/2023     Lab Results   Component Value Date    CHOL 216 (H) 10/17/2023    TRIG 219 (H) 10/17/2023    HDL 36 (L) 10/17/2023    LDL 161 (H) 10/17/2023    VLDL 43.8 10/17/2023     No results found for: VANPK, VANTR  No results found for: CYCLOSPOR, TACROLIMUS, FREEPHENY     Point of Care Testing:  (Last 24 hours):  Glucose: (!) 238  POC Glucose (Download): (!) 212    Radiology and Other Diagnostics Reviewed      Vannie Gentile, APRN-NP   Vascular Neurology

## 2023-10-20 LAB — POC GLUCOSE
~~LOC~~ BKR POC GLUCOSE: 204 mg/dL — ABNORMAL HIGH (ref 70–100)
~~LOC~~ BKR POC GLUCOSE: 268 mg/dL — ABNORMAL HIGH (ref 70–100)
~~LOC~~ BKR POC GLUCOSE: 276 mg/dL — ABNORMAL HIGH (ref 70–100)
~~LOC~~ BKR POC GLUCOSE: 302 mg/dL — ABNORMAL HIGH (ref 70–100)
~~LOC~~ BKR POC GLUCOSE: 341 mg/dL — ABNORMAL HIGH (ref 70–100)
~~LOC~~ BKR POC GLUCOSE: 358 mg/dL — ABNORMAL HIGH (ref 70–100)

## 2023-10-20 MED ORDER — AMLODIPINE 10 MG PO TAB
10 mg | Freq: Every day | NASOGASTRIC | 0 refills | Status: DC
Start: 2023-10-20 — End: 2023-10-28
  Administered 2023-10-21 – 2023-10-28 (×7): 10 mg via NASOGASTRIC

## 2023-10-20 MED ORDER — NPH INSULIN HUMAN RECOMB 100 UNIT/ML (3 ML) SC PEN
10 [IU] | SUBCUTANEOUS | 0 refills | Status: DC
Start: 2023-10-20 — End: 2023-10-21
  Administered 2023-10-20: 21:00:00 10 [IU] via SUBCUTANEOUS

## 2023-10-20 NOTE — Progress Notes
 OCCUPATIONAL THERAPY  PROGRESS NOTE      Name: Kevin Obrien   MRN: [id number]     DOB: Jul 25, 1950      Age: 73 y.o.  Admission Date: 10/17/2023     LOS: 3 days     Date of Service: 10/20/2023      Mobility  Patient Turn/Position: Supine  Mobility Level [facility] Highest Level of Mobility (JH-HLM): Sit edge on of bed  Level of Assistance: Assist X2  Activity Limited By: Fatigue;Mental Status Variability    Subjective  Significant Hospital Events: 73 y.o. male with hypertension, diabetes and prior CAD s/p PCI (~10 years ago) presented with acute right facial droop, left gaze and dysarthria.  Mental / Cognitive: Arousable;Inconsistent with command following  Persons Present: Nursing Staff    Home Living Situation  Lives With: Family  Type of Home: House  Entry Stairs: 2  In-Home Stairs: Able to live on main level  Bathroom Setup: Tub/shower unit  Patient Owned Equipment: Cane: Single point    Prior Level of Function  Level Of Independence: Independent with ADL and household mobility with device  Comments: intermittent use of SPC per daughter  History of Falls in Past 3 Months: Yes    Vision  Comment: left gaze with difficulty crossing midline. patient's daughter reports patient has c/o blurred and double vision. neurology team present to assess visual deficits during therapy session    ADL's  Where Assessed: Edge of Bed  LE Dressing Assist: Maximum Assist  LE Dressing Deficits: Don/Doff R Sock;Don/Doff L Sock    ADL Mobility  Bed Mobility: Supine to Sit: Maximum assist  Bed Mobility: Sit to Supine: Moderate assist  Bed Mobility Comments: HOB elevated, assist for trunk and BLE (Pt sits EOB for approximately 8 minutes. Demos L lateral lean. Min-mod A to achieve midline sitting. Pt scoots towards HOB with mod A.)  Sitting Balance: Moderate assist;Minimal assist;2 UE support (variable assist)    Activity Tolerance  Endurance: 2/5 Tolerates 10-20 Minutes Exercise w/Multiple Rests    Cognition  Expression: Expressive Aphasia;Mumbling/Slurred  Problem Solving: Cueing to Sequence Task;Decreased Judgment/Safety;Direction Following Assist    Education  Persons Educated: Patient  Teaching Methods: Verbal Instruction  Patient Response: Verbalized Understanding  Topics: Role of OT, Goals for Therapy  Goal Formulation: With Patient/Family    Assessment  Prognosis: Good;w/Cont OT s/p Acute Discharge  Goal Formulation: Patient  Comments: patient will benefit from continued therapeutic intervention to maximize safety and independence with ADLs/ADL related mobility    AM-PAC 6 Clicks Daily Activity Inpatient  Putting on and taking off regular lower body clothes: A Lot  Bathing (Including washing, rinsing, drying): A Lot  Toileting, which includes using toilet, bedpan, or urinal: A Lot  Putting on and taking off regular upper body clothing: A Little  Taking care of personal grooming such as brushing teeth: A Little  Eating meals: A Little  Daily Activity Raw Score: 15  Standardized (T-scale) Score: 34.69    Plan  OT Frequency: 5x/week  OT Plan for Next Visit: outcome measure, toileting with clothing management, progress mobility    ADL Goals  Patient Will Perform All ADL's: w/ Stand By Assist    Functional Transfer Goals  Pt Will Perform All Functional Transfers: w/ Stand By Assist    OT Discharge Recommendations  Recommendation: Inpatient setting  Patient Currently Requires Physical Assist With: All mobility;All personal care ADLs;All home functioning ADLs    Therapist: Jodi Munroe, OT  Date: 10/20/2023

## 2023-10-20 NOTE — Progress Notes
 PHYSICAL THERAPY  NOTE      Name: Kevin Obrien   MRN: [id number]     DOB: October 06, 1950      Age: 73 y.o.  Admission Date: 10/17/2023     LOS: 3 days     Date of Service: 10/20/2023      Patient was unavailable for Physical Therapy. Pt receiving bed bath with nursing staff assist upon arrival to room.  Rehabilitation services will continue to follow and provide intervention as indicated.    Therapist: Maricela Shoe, PTA  Date: 10/20/2023

## 2023-10-20 NOTE — Progress Notes
 RT Adult Assessment Note    NAME:Kevin Obrien             MRN: [id number]             DOB:20-Aug-1950          AGE: 73 y.o.  ADMISSION DATE: 10/17/2023             DAYS ADMITTED: LOS: 3 days    Additional Comments:  Impressions of the patient: Patient resting in bed. No visible signs of respiratory distress noted at this time.   Intervention(s)/outcome(s): RT Eval  Patient education that was completed: N/A  Recommendations to the care team: None at this time.     Vital Signs:  Pulse:    RR: 16 PER MINUTE  SpO2: 98 %  O2 Device: None (Room air)  Respiratory WDL: Within Defined Limits  Respiratory Effort/Pattern: Unlabored  Comments:

## 2023-10-20 NOTE — Progress Notes
 Patient started on TF at goal rate.   We will HOLD Lantus.   Start NPH 10 units Q8 hrs and Aspart [facility] q4 hrs.     Titrate insulin as able.     Discussed w Dr Willene Harper.    Victory Gravel, MBBS    Endo fellow

## 2023-10-20 NOTE — Progress Notes
 Vascular Neurology Progress Note      Kevin Obrien  Admission Date: 10/17/2023  LOS: 3 days     Assessment/Plan:  Principal Problem:    Ischemic stroke (CMS-HCC)  Active Problems:    Diabetes mellitus (CMS-HCC)    Neuropathy    Peripheral artery disease    Essential tremor    Poorly controlled diabetes mellitus (CMS-HCC)    Gaze preference    Moderate malnutrition      Kevin Obrien is a 73 y.o. male with hypertension, diabetes and prior CAD s/p PCI (~10 years ago) presented with acute right facial droop, left gaze and dysarthria and was stroke activated. Initial NIH was 6, and the LKW was the night before. His BP at arrival was 210/85. CT head was negative for acute changes, CTA H&N showed no LVO but significant calcified athero and diffuse intracranial vascular stenoses.  He did not receive TNK due to LKW time and no EVT due to no LVO.  He was loaded with Aspirin and Plavix  with plan for DAPT for 3 months. A1c was 11.7%, endocrine consulted.  ECHO showed a PFO with shunt, but ultrasound of the lower extremities was without DVT. MRI brain showed a right sided pontomedullary brainstem infarct.  In light of the results of the MRI, all his clinical symptoms (dysphagia, facial weakness, gaze deviation) can be explained with the stroke location. He still has corpak, will work with SLP for swallow recs.  Pending d/c to [facility] when medically stable.        Acute R pontomedullary infarct  Hx of CAD s/p PCI  Diffuse intracranial athero  Right sided facial weakness (upper and lower)  Forced left gaze deviation  Dysphagia    Imaging  CT Head  1.  No acute intracranial hemorrhage or mass effect.   2.  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   3.  Additional cerebral hypodensities, likely secondary to chronic microvascular ischemic changes.   CTA head  1.  Multifocal stenosis of the anterior, middle, and posterior cerebral arteries most likely secondary to significant intracranial atherosclerosis. No large vessel central occlusion is identified   2.  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   CTA neck  1.  Bilateral atherosclerotic plaque at the carotid bulbs and origins of the ICAs without significant/high-grade stenosis of the ICAs by NASCET criteria.   2. 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   3. 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   MRI Brain  1.  Small acute right pontomedullary brainstem infarct   2.  Stable old left basal ganglia infarct and old left thalamic and right supratentorial white matter lacunar infarcts.   3.  Stable generalized cerebral volume loss and moderate patchy supratentorial white matter FLAIR hyperintensities, likely due to chronic microvascular ischemia.   4.  Intracranial right vertebral stenotic-occlusive disease, better assessed on CTA.       PLAN   > Endocrine consulted for assistance with DM and A1C 11.7  > Dietician consulted for tube feeding recommendations  - Continue DAPT with Aspirin and Plavix for 3 months, then ASA monotherapy  - Daugther reports that patient has not been taking his medications consistently, hence we will restart them  - Increased Insulin from  [facility] to [facility] and will adjust accordingly  - Start Atorvastatin 40 mg daily  - Start norvasc for better bp control   - PT/OT/SLP/Rehab  - SLP recommending NGT for now, will re-eval Monday (has already failed one video swallow)  - Dispo likely [facility]        _______________________________________________________________________    Subjective:  Family at bedside.  They said he had a much better night that was more restful.  He was still a little confused during the night. Corpac has been replaced and he is getting TF.       Objective:                  Vital Signs:  Last Filed                   Vital Signs: 24 Hour Range   BP: 161/67 (05/11 1035)  Temp: 36.6 ?C (97.8 ?F) (05/11 1035)  Pulse: 100 (05/11 1035)  Respirations: 16 PER MINUTE (05/11 1035)  SpO2: 97 % (05/11 1035)  O2 Device: None (Room air) (05/11 1035)  BP: (149-170)/(67-80)   Temp:  [36.4 ?C (97.5 ?F)-37.2 ?C (98.9 ?F)]   Pulse:  [97-103]   Respirations:  [14 PER MINUTE-20 PER MINUTE]   SpO2:  [94 %-97 %]   O2 Device: None (Room air)    Intensity Pain Scale (Self Report): (not recorded)    Intake/Output Summary: (Last 24 hours)    Intake/Output Summary (Last 24 hours) at 10/20/2023 1405  Last data filed at 10/20/2023 1300  Gross per 24 hour   Intake 1561 ml   Output 1800 ml   Net -239 ml         Medications:  Scheduled Meds:[START ON 10/21/2023] amLODIPine (NORVASC) tablet 10 mg, 10 mg, Per NG tube, QDAY  aspirin chewable tablet 81 mg, 81 mg, Per NG tube, QDAY  atorvastatin (LIPITOR) tablet 40 mg, 40 mg, Per NG tube, QDAY  clopiDOGreL (PLAVIX) tablet 75 mg, 75 mg, Per NG tube, QDAY  enoxaparin (LOVENOX) syringe 40 mg, 40 mg, Subcutaneous, QDAY(21)  insulin aspart (U-100) (NOVOLOG FLEXPEN U-100 INSULIN) injection PEN 0-12 Units, 0-12 Units, Subcutaneous, Q4H  insulin NPH isoph U-100 human (HUMULIN N NPH INSULIN KWIKPEN) injection PEN 10 Units, 10 Units, Subcutaneous, Q8H  Protein Supplement Packets, , SEE ADMIN INSTRUCTIONS, QDAY  thiamine mononitrate (vit B1) tablet 100 mg, 100 mg, Per NG tube, QDAY    Continuous Infusions:   Diet Enteral Feeding Standard Infusion 20 mL/hr at 10/19/23 1352     PRN and Respiratory Meds:acetaminophen Q4H PRN **OR** acetaminophen Q4H PRN, dextrose 50% (D50) IV PRN, pancrelipase 20,880 Units/sodium bicarbonate 650 mg ([place] CLOG DESTROYER) PRN (On Call from Rx), phenoL PRN       Neuro exam:   Mental status: alert and awake  Speech: Fluent, dysarthria   Cranial Nerves:    Normal Abnormal   II Pupils reactive, visual fields normal    III, IV, VI  Forced gaze deviation to the left, not reaching midline   V Sensation nml V1-V3    VII  Right sided facial weakness   VIII nml to conversation    IX, X +strong cough    XI Equal shoulder shrug    XII Tongue midline      Muscle/motor: 5/5 in all   Sensation: Normal, no abnormalities   Coordination:    Normal Abnormal Right Abnormal Left   Finger to Nose x     Rapid alternating  x  Heel to Shin x     Finger tap x     Foot tap      Other        Gait and Sation: Seen walking with PT; leaning towards the left. Needing assistance.  Laboratory Review:  No results found for: PHART, PCO2A, PO2ART, HCO3A, Gale Jude, O2SATACAL  Lab Results   Component Value Date    HGB 15.1 10/20/2023    HCT 45.6 10/20/2023    PLTCT 213 10/20/2023    WBC 11.50 (H) 10/20/2023    NEUT 59.3 10/17/2023    ANC 4.00 10/17/2023     Lab Results   Component Value Date    NA 138 10/20/2023    K 3.9 10/20/2023    CL 101 10/20/2023    CO2 24 10/20/2023    GAP 13 (H) 10/20/2023    BUN 16 10/20/2023    CR 0.76 10/20/2023    GLU 311 (H) 10/20/2023    CA 9.3 10/20/2023    MG 1.8 10/20/2023    LACTIC 1.2 08/31/2015    TOTPROT 6.9 10/17/2023    AST 17 10/17/2023    ALT 20 10/17/2023    ALKPHOS 55 10/17/2023     Lab Results   Component Value Date    TNI 0.00 02/22/2019     Lab Results   Component Value Date    PTT 27.6 10/17/2023    INR 1.0 (L) 10/17/2023    DIMER 254 02/22/2019     Lab Results   Component Value Date    TSH 0.40 06/05/2023     Lab Results   Component Value Date    CHOL 216 (H) 10/17/2023    TRIG 219 (H) 10/17/2023    HDL 36 (L) 10/17/2023    LDL 161 (H) 10/17/2023    VLDL 43.8 10/17/2023     No results found for: VANPK, VANTR  No results found for: CYCLOSPOR, TACROLIMUS, FREEPHENY     Point of Care Testing:  (Last 24 hours):  FSBS (Manual): (!) 260  Glucose: (!) 311  POC Glucose (Download): (!) 341    Radiology and Other Diagnostics Reviewed      Deanie Ewings, MD   Vascular Neurology

## 2023-10-21 ENCOUNTER — Encounter: Admit: 2023-10-21 | Discharge: 2023-10-21 | Payer: MEDICARE

## 2023-10-21 ENCOUNTER — Inpatient Hospital Stay: Admit: 2023-10-21 | Discharge: 2023-10-21 | Payer: MEDICARE

## 2023-10-21 DIAGNOSIS — I639 Cerebral infarction, unspecified: Secondary | ICD-10-CM

## 2023-10-21 LAB — POC GLUCOSE
~~LOC~~ BKR POC GLUCOSE: 291 mg/dL — ABNORMAL HIGH (ref 70–100)
~~LOC~~ BKR POC GLUCOSE: 266 mg/dL — ABNORMAL HIGH (ref 70–100)
~~LOC~~ BKR POC GLUCOSE: 295 mg/dL — ABNORMAL HIGH (ref 70–100)
~~LOC~~ BKR POC GLUCOSE: 237 mg/dL — ABNORMAL HIGH (ref 70–100)
~~LOC~~ BKR POC GLUCOSE: 253 mg/dL — ABNORMAL HIGH (ref 70–100)
~~LOC~~ BKR POC GLUCOSE: 307 mg/dL — ABNORMAL HIGH (ref 70–100)
~~LOC~~ BKR POC GLUCOSE: 320 mg/dL — ABNORMAL HIGH (ref 70–100)
~~LOC~~ BKR POC GLUCOSE: 344 mg/dL — ABNORMAL HIGH (ref 70–100)

## 2023-10-21 MED ORDER — LACTATED RINGERS IV SOLP
Freq: Once | INTRAVENOUS | 0 refills | Status: DC
Start: 2023-10-21 — End: 2023-10-21

## 2023-10-21 MED ORDER — INSULIN ASPART 100 UNIT/ML SC FLEXPEN
0-12 [IU] | SUBCUTANEOUS | 0 refills | Status: DC
Start: 2023-10-21 — End: 2023-10-28

## 2023-10-21 MED ORDER — INSULIN GLARGINE 100 UNIT/ML (3 ML) SC INJ PEN
15 [IU] | Freq: Every day | SUBCUTANEOUS | 0 refills | Status: DC
Start: 2023-10-21 — End: 2023-10-22

## 2023-10-21 MED ORDER — LACTATED RINGERS IV BOLUS
1000 mL | Freq: Once | INTRAVENOUS | 0 refills | Status: CP
Start: 2023-10-21 — End: ?
  Administered 2023-10-21: 22:00:00 1000 mL via INTRAVENOUS

## 2023-10-21 MED ORDER — INSULIN REGULAR HUMAN 100 UNIT/ML (3 ML) SC INPN
15 [IU] | SUBCUTANEOUS | 0 refills | Status: DC
Start: 2023-10-21 — End: 2023-10-22
  Administered 2023-10-21 – 2023-10-22 (×4): 15 [IU] via SUBCUTANEOUS

## 2023-10-21 MED ORDER — LISINOPRIL 10 MG PO TAB
5 mg | Freq: Every day | GASTROSTOMY | 0 refills | Status: DC
Start: 2023-10-21 — End: 2023-10-26
  Administered 2023-10-21 – 2023-10-25 (×4): 5 mg via GASTROSTOMY

## 2023-10-21 MED ORDER — LIDOCAINE HCL 2 % MM JELP
TOPICAL | 0 refills | Status: DC | PRN
Start: 2023-10-21 — End: 2023-10-28
  Administered 2023-10-23: 21:00:00 20.0000 mL via TOPICAL

## 2023-10-21 NOTE — Progress Notes
 Vascular Neurology Progress Note      Kevin Obrien  Admission Date: 10/17/2023  LOS: 4 days     Assessment/Plan:  Principal Problem:    Ischemic stroke (CMS-HCC)  Active Problems:    Diabetes mellitus (CMS-HCC)    Neuropathy    Peripheral artery disease    Essential tremor    Poorly controlled diabetes mellitus (CMS-HCC)    Gaze preference    Moderate malnutrition      Kevin Obrien is a 73 y.o. male with hypertension, diabetes and prior CAD s/p PCI (~10 years ago) presented with acute right facial droop, left gaze and dysarthria and was stroke activated. Initial NIH was 6, and the LKW was the night before. His BP at arrival was 210/85. CT head was negative for acute changes, CTA H&N showed no LVO but significant calcified athero and diffuse intracranial vascular stenoses.  He did not receive TNK due to LKW time and no EVT due to no LVO.  He was loaded with Aspirin and Plavix  with plan for DAPT for 3 months. A1c was 11.7%, endocrine consulted.  ECHO showed a PFO with shunt, but ultrasound of the lower extremities was without DVT. MRI brain showed a right sided pontomedullary brainstem infarct.  In light of the results of the MRI, all his clinical symptoms (dysphagia, facial weakness, gaze deviation) can be explained with the stroke location. He still has corpak, will work with SLP for swallow recs.  Pending d/c to [facility] when medically stable.      Neuro exam on 10/21/23   AOX4, left gaze preference, unable to cross midline, strength 5/5, inatct FTN      Acute R pontomedullary infarct  Hx of CAD s/p PCI  Diffuse intracranial athero  Right sided facial weakness (upper and lower)  Forced left gaze deviation  Dysphagia    NeuroImaging  CT Head  1.  No acute intracranial hemorrhage or mass effect.   2.  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   3.  Additional cerebral hypodensities, likely secondary to chronic microvascular ischemic changes.   CTA head  1.  Multifocal stenosis of the anterior, middle, and posterior cerebral arteries most likely secondary to significant intracranial atherosclerosis. No large vessel central occlusion is identified   2.  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   CTA neck  1.  Bilateral atherosclerotic plaque at the carotid bulbs and origins of the ICAs without significant/high-grade stenosis of the ICAs by NASCET criteria.   2. 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   3. 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   MRI Brain  1.  Small acute right pontomedullary brainstem infarct   2.  Stable old left basal ganglia infarct and old left thalamic and right supratentorial white matter lacunar infarcts.   3.  Stable generalized cerebral volume loss and moderate patchy supratentorial white matter FLAIR hyperintensities, likely due to chronic microvascular ischemia.   4.  Intracranial right vertebral stenotic-occlusive disease, better assessed on CTA.     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 after evaluation on 10/21/2023  > Tube feeds via Corpak  > Lidocaine jelly, phenol throat spray for Corpak related discomfort  > Reevaluation by video swallow on 10/23/2023 -May need PEG tube  > CXR on 5/12 did not reveal any aspiration pneumonia    DVT Prophylaxis: Lovenox 40mg  qday   Anticoagulation: none  PT/OT recs: IPR  Diet: N.p.o., tube feeds via Corpak  Dispo: Pending swallow evaluation  Code: Full    Patient seen and discussed with Dr. Alonso Jan who helped formulate the assessment and plan.    Jordana Dugue, MBBS  Neurology, PGY4  Available on Voalte                _______________________________________________________________________    Subjective:  Family at bedside.  Patient tearful this a.m.-was working with occupational therapy-added lidocaine jelly for palpated discomfort near his nose.      Objective:                  Vital Signs:  Last Filed                   Vital Signs: 24 Hour Range   BP: 156/83 (05/12 0623)  Temp: 37.2 ?C (99 ?F) (05/12 0623)  Pulse: 104 (05/12 0623)  Respirations: 18 PER MINUTE (05/12 0623)  SpO2: 97 % (05/12 0623)  O2 Device: None (Room air) (05/12 0623)  BP: (143-164)/(66-83)   Temp:  [36.6 ?C (97.8 ?F)-37.4 ?C (99.3 ?F)]   Pulse:  [100-110]   Respirations:  [16 PER MINUTE-18 PER MINUTE]   SpO2:  [94 %-98 %]   O2 Device: None (Room air)    Intensity Pain Scale (Self Report): (not recorded)    Intake/Output Summary: (Last 24 hours)    Intake/Output Summary (Last 24 hours) at 10/21/2023 0810  Last data filed at 10/20/2023 2045  Gross per 24 hour   Intake 1391 ml   Output 2250 ml   Net -859 ml         Medications:  Scheduled Meds:amLODIPine (NORVASC) tablet 10 mg, 10 mg, Per NG tube, QDAY  aspirin chewable tablet 81 mg, 81 mg, Per NG tube, QDAY  atorvastatin (LIPITOR) tablet 40 mg, 40 mg, Per NG tube, QDAY  clopiDOGreL (PLAVIX) tablet 75 mg, 75 mg, Per NG tube, QDAY  enoxaparin (LOVENOX) syringe 40 mg, 40 mg, Subcutaneous, QDAY(21)  insulin aspart (U-100) (NOVOLOG FLEXPEN U-100 INSULIN) injection PEN 0-12 Units, 0-12 Units, Subcutaneous, Q4H  insulin NPH isoph U-100 human (HUMULIN N NPH INSULIN KWIKPEN) injection PEN 10 Units, 10 Units, Subcutaneous, Q8H  Protein Supplement Packets, , SEE ADMIN INSTRUCTIONS, QDAY  thiamine mononitrate (vit B1) tablet 100 mg,  100 mg, Per NG tube, QDAY    Continuous Infusions:   Diet Enteral Feeding Standard Infusion 20 mL/hr at 10/19/23 1352     PRN and Respiratory Meds:acetaminophen Q4H PRN **OR** acetaminophen Q4H PRN, dextrose 50% (D50) IV PRN, pancrelipase 20,880 Units/sodium bicarbonate 650 mg ([place] CLOG DESTROYER) PRN (On Call from Rx), phenoL PRN       Neuro exam:   Mental status: alert and awake  Speech: Fluent, dysarthria   Cranial Nerves:    Normal Abnormal   II Pupils reactive, visual fields normal    III, IV, VI  Forced gaze deviation to the left, not reaching midline   V Sensation nml V1-V3    VII     VIII nml to conversation    IX, X +strong cough    XI Equal shoulder shrug    XII Tongue midline      Muscle/motor: 5/5 in all   Sensation: Normal, no abnormalities   Coordination:    Normal Abnormal Right Abnormal Left   Finger to Nose x                                     Gait and Sation: Seen walking with PT; leaning towards the left. Needing assistance.  Laboratory Review:  No results found for: Shermon Divine, O2SATACAL  Lab Results   Component Value Date    HGB 15.5 10/21/2023    HCT 46.3 10/21/2023    PLTCT 218 10/21/2023    WBC 11.90 (H) 10/21/2023    NEUT 59.3 10/17/2023    ANC 4.00 10/17/2023     Lab Results   Component Value Date    NA 137 10/21/2023    K 3.8 10/21/2023    CL 101 10/21/2023    CO2 25 10/21/2023    GAP 11 10/21/2023    BUN 14 10/21/2023 CR 0.61 10/21/2023    GLU 240 (H) 10/21/2023    CA 9.4 10/21/2023    MG 1.8 10/21/2023    LACTIC 1.2 08/31/2015    TOTPROT 6.9 10/17/2023    AST 17 10/17/2023    ALT 20 10/17/2023    ALKPHOS 55 10/17/2023     Lab Results   Component Value Date    TNI 0.00 02/22/2019     Lab Results   Component Value Date    PTT 27.6 10/17/2023    INR 1.0 (L) 10/17/2023    DIMER 254 02/22/2019     Lab Results   Component Value Date    TSH 0.40 06/05/2023     Lab Results   Component Value Date    CHOL 216 (H) 10/17/2023    TRIG 219 (H) 10/17/2023    HDL 36 (L) 10/17/2023    LDL 161 (H) 10/17/2023    VLDL 43.8 10/17/2023     No results found for: VANPK, VANTR  No results found for: CYCLOSPOR, TACROLIMUS, FREEPHENY     Point of Care Testing:  (Last 24 hours):  FSBS (Manual): (!) 291  Glucose: (!) 240  POC Glucose (Download): (!) 320    Radiology and Other Diagnostics Reviewed      Gustavus Leisure, MBBS   Vascular Neurology

## 2023-10-21 NOTE — Progress Notes
 SPEECH-LANGUAGE PATHOLOGY  DAILY TREATMENT NOTE    Treatment Summary:   Dysphagia therapy completed.     Moderate-severe oropharyngeal dysphagia  Sources of Dysphagia:  impaired motor processing/incoordination w/ a component of weakness   Persons Educated: Patient/Family  Prognosis: Fair    Swallow Recommendations  PO: Ice chips only w/ excellent oral care to minimize potential of aspirating oral bacteria   NPO: Consider short term non-oral nutrition  Medications: non-oral   Supervision: yes when taking ice  Dysphagia Management: dysphagia tx & anticpate need for repeat instrumental swallow eval given time/videoswallow study   Oral Hygiene: Complete oral care to minimize the risk of aspirating oral bacteria          Education  Education was provided to: pt/family  Education provided: Options for dysphagia management pending improvement over next few days. Potential for repeat VSS Middle of the week after few day intense focus on swallow HEP.      Goal : The pt will complete exercises to promote base of tongue, posterior pharyngeal wall and hyo-laryngeal strengthening given consistent cues.  Met  Comment: Introduced swallow exercise program. Pt unable to spontaneously swallow without bolus to complete Masako or Mendelsohn exercises.  Pt completed:  Effortful swallow w/ PO x5  Lingual press against resistance x10  Provided handout of exercises w/ HEP. No questions from pt or family.   Continue to address this goal    Goal:  The pt will participate in a repeat instrumental swallow eval given consistent cues to maintain attention.   Not addressed  Comment: Tentative plan to complete middle of week.  Continue to address this goal    Goal:  The pt will participate in trials of thin liquids and puree solids w/ use of hard, fast swallow tolerating free of pulmonary status changes.  Partly met  Comment: Pt receiving CXR due to change in lung sounds. Attempted trials of thin water and ice chips only. Wet phonation following trials w/ throat clear.   Continue to address this goal        Plan: PO trials, Dysphagia exercises, Speech-language treatment  Frequency: 3-5x/week    Therapist: Raymondo Calin, SLP MA, [employer] Voalte# [phone]  Date: 10/21/2023

## 2023-10-21 NOTE — Progress Notes
 PHYSICAL THERAPY  PROGRESS NOTE          Name: Kevin Obrien   MRN: [id number]     DOB: 05/30/1951      Age: 73 y.o.  Admission Date: 10/17/2023     LOS: 4 days     Date of Service: 10/21/2023        Mobility  Patient Turn/Position: Supine  Mobility Level [facility] Highest Level of Mobility (JH-HLM): Move to Chair/Commode  Level of Assistance: Assist X1  Assistive Device: Hand Held  Activity Limited By: Susette Erb Status Variability    Subjective  Significant Hospital Events: 73 y.o. male with hypertension, diabetes and prior CAD s/p PCI (~10 years ago) presented with acute right facial droop, left gaze and dysarthria.  Mental / Cognitive: Alert;Oriented;Cooperative;Follows commands  Pain: No complaint of pain  Persons Present: Daughter  Comments: On entry and exit, pt supine in bed with all needs in reach and bed alarm on. Provider at bedside following tx.q    Home Living Situation  Lives With: Family  Type of Home: House  Entry Stairs: 2  In-Home Stairs: Able to live on main level  Bathroom Setup: Tub/shower unit  Patient Owned Equipment: Cane: Single point    Prior Level of Function  Level Of Independence: Independent with ADL and household mobility with device  Comments: intermittent use of SPC per daughter  History of Falls in Past 3 Months: Yes    ROM  R LE ROM: WFL  R LE ROM Method: Active  L LE ROM: WFL  L LE ROM Method: Active    Strength  Overall Strength: Able to move all joints independently through available ROM    Bed Mobility/Transfer  Bed Mobility: Supine to Sit: Moderate Assist;Assist with Trunk;Head of Bed Elevated;No Rail  Transfer Type: Sit to/from Stand  Transfer: Assistance Level: Moderate Assist;To/From;Bed;Bed Side Chair  Transfer: Assistive Device: Chief of Staff Assist  Transfers: Type Of Assistance: Verbal Cues;For Balance;For Strength Deficit;For Safety Considerations;Requires Extra Time  Other Transfer Type: Stand Pivot  Other Transfer: Assistance Level: Maximal Assist;From;Bed;To;Bed Side Chair  Other Transfer: Assistive Device: Hand Hold Assist  Other Transfer: Type Of Assistance: Verbal Cues;For Balance;For Strength Deficit;For Safety Considerations;Requires Extra Time  End Of Activity Status: Up in Chair;Nursing Notified;Instructed Patient to Request Assist with Mobility;Instructed Patient to Use Call Light (chair alarm activated)    Activity/Exercise  Comments: Sitting balance at edge of bed with verbal and tactile cues for neutral position; tendency to trunk lean L. Cues for upright posture, scapular retraction, and cervical extension. Transfer to chair with verbal cues for LE sequencing.    Education  Persons Educated: Patient/Family  Patient Barriers To Learning: None Noted  Teaching Methods: Verbal Instruction  Patient Response: Verbalized Understanding  Topics: Plan/Goals of PT Interventions;Importance of Increasing Activity;Recommend Continued Therapy    Assessment/Progress  Impaired Mobility Due To: Impaired Balance;Decreased Activity Tolerance  Assessment/Progress: Should Improve w/ Continued PT    AM-PAC 6 Clicks Basic Mobility Inpatient  Turning from your back to your side while in a flat bed without using bed rails: A Little  Moving from lying on your back to sitting on the side of a flat bed without using bedrails : A Little  Moving to and from a bed to a chair (including a wheelchair): A Little  Standing up from a chair using your arms (e.g. wheelchair, or bedside chair): A Lot  To walk in hospital room: A Lot  Climbing 3-5 steps with a railing: A Lot  Basic Mobility Inpatient Raw Score: 15  Standardized (T-scale) Score: 36.97  Mobility Goal [facility]: JH-HLM 4 Move to Chair    Goals  Goal Formulation: With Patient/Family  Time For Goal Achievement: 5 days  Patient Will Go Supine To/From Sit: w/ Stand By Assist  Patient Will Transfer Sit to Stand: w/ Stand By Assist  Patient Will Ambulate: 51-100 Feet, w/ Jeananne Mighty, w/ Stand By Assist  Patient Will Go Up / Down Stairs: 1-2 Stairs, w/ Stand By Assist    Plan  Treatment Interventions: Mobility training;Balance activities  Plan Frequency: 5-7 Days per Week  PT Plan for Next Visit: Work on sitting and standing balance;transfers, gait with Ax2 HHA or at rail    PT Discharge Recommendations  Recommendation: Inpatient setting;Recommend rehab medicine consult    Therapist: Aloysius Janus, PT, DPT  Date: 10/21/2023

## 2023-10-21 NOTE — Progress Notes
 OCCUPATIONAL THERAPY  PROGRESS NOTE      Name: Kevin Obrien   MRN: [id number]     DOB: [birth year]      Age: 73 y.o.  Admission Date: 10/17/2023     LOS: 4 days     Date of Service: 10/21/2023      Mobility  Patient Turn/Position: Supine  Mobility Level [facility] Highest Level of Mobility (JH-HLM): Sit edge on of bed  Level of Assistance: Assist X2  Activity Limited By: Weakness    Subjective  Significant Hospital Events: 73 y.o. male with hypertension, diabetes and prior CAD s/p PCI (~10 years ago) presented with acute right facial droop, left gaze and dysarthria.  Mental / Cognitive: Alert;Oriented;Cooperative;Follows commands  Pain: No complaint of pain  Persons Present: Nursing Staff;Daughter;Provider  Comments: On entry and exit, pt supine in bed with all needs in reach and bed alarm on. Provider at bedside following tx.q    Home Living Situation  Lives With: Family  Type of Home: House  Entry Stairs: 2  In-Home Stairs: Able to live on main level  Bathroom Setup: Tub/shower unit  Patient Owned Equipment: Cane: Single point    Prior Level of Function  Level Of Independence: Independent with ADL and household mobility with device  Comments: intermittent use of SPC per daughter  History of Falls in Past 3 Months: Yes    ADL's  Where Assessed: Edge of Bed  Grooming Assist: Minimal Assist  Grooming Deficits: Steadying;Teeth Care;Wash/Dry Face;Wash/Dry Hands  Comment: Performs oral hygiene, hair care, and washes face while seated EOB qith min assist for trunk to remain upright. L lateral lean present and requires mod cueing to remain at midline while seated upright. Also leans back with min cueing needed to adjust forward.    ADL Mobility  Bed Mobility: Supine to Sit: Moderate assist  Bed Mobility: Sit to Supine: Moderate assist  Bed Mobility Comments: HOB elevated, assist with BLE, assist with trunk for supine to sit  End of Activity Status: In bed;Instructed patient to use call light;Instructed patient to request assist with mobility  Transfer Comments: Demos good initiation of transfers, L lateral lean, able to scoot forward with mod cueing and min assist  Sitting Balance: Static sitting balance;Dynamic sitting balance;2 UE support;Moderate assist    Activity Tolerance  Endurance: 2/5 Tolerates 10-20 Minutes Exercise w/Multiple Rests    Cognition  Expression: Expressive Aphasia;Mumbling/Slurred  Social Interaction: Interacts in a Spontaneous,Cooperative Manner  Problem Solving: Cueing to Sequence Task;Decreased Judgment/Safety;Direction Following Assist  Orientation: Alert & Oriented x3;To Person;To Place;To Situation  Attention: Awake/Alert    Education  Persons Educated: Patient/Family  Teaching Methods: Verbal Instruction  Patient Response: Verbalized Understanding  Topics: Role of OT, Goals for Therapy  Goal Formulation: With Patient/Family    Assessment  Prognosis: Good;w/Cont OT s/p Acute Discharge  Goal Formulation: Patient  Comments: Pt will continue to benefit from OT skilled services during acute care admission to increase and maximize safety and independence with ADLs/ADL related mobility.    AM-PAC 6 Clicks Daily Activity Inpatient  Putting on and taking off regular lower body clothes: A Lot  Bathing (Including washing, rinsing, drying): A Lot  Toileting, which includes using toilet, bedpan, or urinal: A Lot  Putting on and taking off regular upper body clothing: A Little  Taking care of personal grooming such as brushing teeth: A Little  Eating meals: A Little  Daily Activity Raw Score: 15  Standardized (T-scale) Score: 34.69    Plan  OT Frequency: 5x/week  OT Plan for Next Visit: outcome measure, toileting with clothing management, progress mobility    ADL Goals  Patient Will Perform All ADL's: w/ Stand By Assist    Functional Transfer Goals  Pt Will Perform All Functional Transfers: w/ Stand By Assist    OT Discharge Recommendations  Recommendation: Inpatient setting;Recommend rehab medicine consult  Patient Currently Requires Physical Assist With: All mobility;All personal care ADLs;All home functioning ADLs  Patient Currently Requires Supervision For: Making decisions about safety      Therapist: Sylvania Evens, OTD, OTR/L  Date: 10/21/2023

## 2023-10-21 NOTE — Care Plan
 Problem: Discharge Planning  Goal: Participation in plan of care  Outcome: Goal Ongoing  Flowsheets (Taken 10/17/2023 1503 by Odetta Benes, RN)  Participation in Plan of Care: Involve patient/caregiver in care planning decision making  Goal: Knowledge regarding plan of care  Outcome: Goal Ongoing  Flowsheets (Taken 10/17/2023 1503 by Odetta Benes, RN)  Knowledge regarding plan of care:   Provide admission education to parent/caregiver   Provide fall prevention education   Provide infection prevention education   Provide plan of care education   Provide procedural and treatment education   Provide medication management education   Provide VTE signs and symptoms education  Goal: Prepared for discharge  Outcome: Goal Ongoing     Problem: Neurological Status, Impaired/Altered  Goal: Progress toward maximizing functional outcomes  Outcome: Goal Ongoing  Flowsheets (Taken 10/17/2023 1503 by Odetta Benes, RN)  Progress toward maximizing functional outcomes:   Manage environmental safety   Address etiologies   Promote functional status   Observe patient activities   Provide positive coping methods education   Provide functional status promotion education   Provide impaired mental status condition education to family/caregiver   Orient patient to reality   Reduce impaired mental status risk   Consider consult for impaired/altered neurological status   Establish therapeutic relationship   Provide safety precautions education  Goal: Cognitive status restored to baseline  Outcome: Goal Ongoing  Flowsheets (Taken 10/17/2023 1503 by Odetta Benes, RN)  Cognitive status restored to baseline:   Assess patient history   Reduce delirium risk   Consider consult for impaired cognitive status   Observe patient acitvities   Provide delirium signs and symptoms education   Promote rest   Orient patient to reality   Complete delirium assessment scale     Problem: High Fall Risk  Goal: High Fall Risk  Outcome: Goal Ongoing  Flowsheets (Taken 10/17/2023 1503 by Odetta Benes, RN)  High Fall Risk:   All patients will receive: High fall risk sign, yellow wristband, yellow socks, gait belt, and shower shoes   Engage bed alarm - middle setting   Stay with the patient while toileting/showering   Engage chair alarm   PT/OT consult for fall prevention assessment if scoring in Unsteady Gait or Visual or Auditory impairment   Remove excess equipment/supplies   Educate patient to use call-light if tethered   Use shower shoes   Maximize bed functionality (optimize bed height, firm/flat surface)   Use safe patient handling equipment as appropriate   Assess need for bedside commode with drop arm to be available in room     Problem: Skin Integrity  Goal: Skin integrity intact  Outcome: Goal Ongoing  Flowsheets (Taken 10/17/2023 1503 by Odetta Benes, RN)  Skin integrity intact:   Assess nutrition   Provide skin care interventions   Promote nutrition   Assure position change   Provide incontinence management interventions   Monitor skin integrity   Reduce skin shear, friction and tissue load   Provide skin self-assessment education   Consider consult for skin integrity  Goal: Healing of skin (Wound & Incision)  Outcome: Goal Ongoing  Flowsheets (Taken 10/17/2023 1503 by Odetta Benes, RN)  Healing of wound (wounds and Incisions):   Assess for signs and symptoms of wound infection   Assess wound site healing   Implement wound/incision care as ordered   Provide wound/incision care as ordered  Goal: Healing of skin (Pressure Injury)  Outcome: Goal Ongoing     Problem: Mobility/Activity Intolerance  Goal:  Maximize functional ADL's and mobility outcomes  Outcome: Goal Ongoing  Flowsheets (Taken 10/17/2023 1503 by Odetta Benes, RN)  Maximize functional ADLs and mobility outcomes:   Administer oxygen to maintain SpO2 at approprate levels   Maintain body position   Consider consult for mobility/activity intolerance   Manage environmental safety   Manage energy conservation for mobility/activity intolerance   Physical thrapy evaluation and treatment   Consider post-operative precautions   Occupational therapy evaulation and treatment   Cardiac Rehab evaluation and treatment     Problem: VTE, Risk of  Goal: Absence of venous thrombosis  Outcome: Goal Ongoing  Flowsheets (Taken 10/17/2023 1503 by Odetta Benes, RN)  Absence of venous thrombosis:   Utilize prevent measures   Assess peripheral circulation   Assess for signs and symptoms of venous thrombosis  Goal: Knowledge of warfarin regimen  Outcome: Goal Ongoing     Problem: Injury-Risk of, Non-Violent Physical Restraints  Goal: Absence of Injury while physically restrained (Non-Violent)  Outcome: Goal Ongoing     Problem: Nutrition Deficit  Goal: Adequate nutritional intake  Outcome: Goal Ongoing  Flowsheets (Taken 10/21/2023 0048)  Adequate nutritional intake: Assess nutritional status  Goal: Body weight within specified parameters  Outcome: Goal Ongoing

## 2023-10-21 NOTE — Case Management (ED)
 Case Management Progress Note    NAME:Kevin Obrien                          MRN: [id number]              DOB:Jan 10, 1951          AGE: 73 y.o.  ADMISSION DATE: 10/17/2023             DAYS ADMITTED: LOS: 4 days      Today's Date: 10/21/2023    PLAN: Anticipate dc to [facility], pending medical stability and insurance auth.    Expected Discharge Date: 10/22/2023   Is Patient Medically Stable: No, Please explain: [facility]  Are there Barriers to Discharge? no    INTERVENTION/DISPOSITION:  Discharge Planning                 SW reviewed EMR and met with NCM and Stroke team for huddle to discuss plan of care. Pt remains with [facility].    SW updated [facility]. Insurance [employer] is pending.    SW will continue to remain available and assist with discharge needs as they arise.    Transportation              Does the Patient Need Case Management to Arrange Discharge Transport? (ex: facility, ambulance, wheelchair/stretcher, Medicaid, cab, other): No  Will the Patient Use Family Transport?: Yes  Transportation Name, Phone and Availability #1: pt's daughters  Support                 Info or Referral                 Positive SDOH Domains and Potential Barriers                   Medication Needs                                                                                                                                                         Financial                 Legal                 Other                 Discharge Disposition  Dorisann Garre    Dorisann Garre, [place]  Inpatient Social Work Case Manager  Neurology General and Stroke  Available via [facility]  Cell: [phone]

## 2023-10-21 NOTE — Progress Notes
 SPEECH-LANGUAGE PATHOLOGY  SPEECH-LANGUAGE ASSESSMENT   Name: Kevin Obrien   MRN: [id number]     DOB: 10-Nov-1950      Age: 73 y.o.  Admission Date: 10/17/2023     LOS: 4 days     Date of Service: 10/21/2023        Evaluation Summary  Pt participating in speech/language evaluation this date utilizing the Western Aphasia Battery - Revised Bedside Form (WAB-R). Pt w/ severe communication impairment. Expressive language characterized by halting, paraphasic (primarily phonemic paraphasias) w/ some complete sentences and significant word finding difficulty. 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 Receptive language characterized by good command following and yes/no comprehension, and good repetition of single words w/ greater difficulty w/ greater complexity items.     RECOMMENDATIONS:  -Intermittent supervision at d/c as anticipate pt impaired communication will negatively impact safety.  -Provide information in short/simple sentences while honoring pt intellect.  -Allow pt ample time to respond to communication.  -Repeat information as needed  -Ongoing SLP while admitted for speech/language. Ongoing SLP at next level of care.     Western Aphasia Battery - Revised (WAB-R): Bedside Form  Section Task Score Notes   Spontaneous speech         Information content  2/10     Fluency, grammatical competence, and paraphasias  5/10 Halting, paraphasic, but more complete sentences; significant word finding difficulty   Auditory verbal comprehension         Yes/no questions  8/10     Sequential commands  10/10    Repetition         Repetition   4.5/10    Naming and word-finding         Object naming  8/10 Pt code switching between [place] and Spanish   Bedside Aphasia Score   37.5/100 Very severe: 0-25  Severe:  26-50  Moderate:  51-75  Mild:  76+     Motor speech: Imprecise articulation and slurred speech. Articulatory groping present.    Subjective  Reason for Admission and Past Medical Hx: No H/P at time of evaluation; Kevin Obrien and responding stroke RN providing verbal report. Pt w/ NIH 5 at time of arrival. Symptoms starting previous evening w/ dizzy spell and nausea. Pt went to bed and woke w/ L gaze deviation and facial droop. EMS noted L sided weakness w/ RN reporting resolution of L weakness. Failed SDS 2/2 dysarthria.  Significant Hospital Events: 73 y.o. male with hypertension, diabetes and prior CAD s/p PCI (~10 years ago) presented with acute right facial droop, left gaze and dysarthria.  Mental / Cognitive: Alert, Oriented, Cooperative, Follows commands  Pain: No complaint of pain  Pain level: Before activity, During activity, After activity  Persons Present: Daughter  Comments: On entry and exit, pt supine in bed with all needs in reach and bed alarm on. Provider at bedside following tx.q    Home Living Situation  Lives With: Family  Type of Home: House    Prior Level of Function  Level Of Independence: Independent with ADL and household mobility with device  Comments: intermittent use of SPC per daughter    Education  Persons Educated: Pt/Family  Barriers To Learning: Impaired Communication  Interventions: Haematologist Educated, Family Educated  Teaching Methods: Verbal  Topics:  (Communication)  Patient Response: Verbalized Understanding  Goal Formulation: With Pt/Family    Speech Language Goals  Goal : Pt  will utilize motor speech strategies in structured tasks w/ min cues for implementation.  Goal : Pt will utilize sentences to describe picture/object/or needs in 90% of opportunities.  Goal : Pt will participate in ongoing evaluation of speech/language (including reading/writing) w/ min cues.    Therapist: Raymondo Calin, SLP  MA, [facility] Voalte# [phone]  Date: 10/21/2023

## 2023-10-21 NOTE — Progress Notes
 Endocrinology Note   Name:  Kevin Obrien MRN:  [id number] Admission Date:  10/17/2023    Principal Problem:    Ischemic stroke (CMS-HCC)  Active Problems:    Diabetes mellitus (CMS-HCC)    Neuropathy    Peripheral artery disease    Essential tremor    Poorly controlled diabetes mellitus (CMS-HCC)    Gaze preference    Moderate malnutrition      Reason for Consult:     10/19/23 1048  CONSULT ENDOCRINOLOGY PHYSICIAN  ONCE        Priority: Routine   Ordering Provider: Vannie Gentile, APRN-NP      Provider: (Not yet assigned)   Question Answer Comment   Reason for consult high A1C, no management    Is this consult for diabetes management? Yes    Consult Type: Co-Management w/Signed Orders    Ordering Provider's or Responsible Teams's Phone/Pager number? Number not on file              Assessment / Plan     Admission date and time: 10/17/2023  8:24 AM     Type 2 Diabetes Mellitus uncontrolled  Insulin dose changed  Non adherance to meds  Acute stroke   Dyslipidemia   Tube feed hyperglycemia   A1c 11.7, 10/17/2023   PTA regimen: non compliant, was supposed to be on meal time insulin. Was taking very sporadically   Was not taking any PO meds either   Hypoglycemic episodes on this regimen: none   Follows up with ? PCP for diabetes management  Diabetic-complications assessment:  CAD, stroke, neuropathy       Current diet: NPO , Jevity 1.5 TF at goal 45 ml/hr  Glucocorticoids: none     Glucose values over the past 24 hours:    Estimated Creatinine Clearance: 87.3 mL/min (based on SCr of 0.61 mg/dL).    Recent Labs     10/20/23  1220 10/20/23  1534 10/20/23  2037 10/20/23  2225 10/20/23  2343 10/21/23  0335 10/21/23  0625 10/21/23  0734   GLUPOC 341* 358* 266* 295* 291* 253* 237* 320*       Diabetes Regimen Changes today based on glucose values:    Resume insulin glargine 15u every day   Discontinue insulin NPH  Begin regular insulin 15u q6h. Hold if tube feeds are held  Continue correction factor; timing adjusted to q6h to correspond to regular insulin dosing.     Goal BG 140-180.     Diabetes Plans Post Discharge  Diabetes regimen: TBD  He will most likely need insulin on discharge.   We will request for outpatient follow-up. Family would like to come to [place] [facility]. He used to see [place] [facility] and last visit was in 2020    Patient discussed with Dr. Zafar.   Neomi Banks, MD  PGY-3, Internal Medicine       Subjective:      Kevin Obrien is a 73 y.o. male with history of hypertension, diabetes, coronary artery disease status post PCI presented with an acute stroke.  History of nonadherence to medications.   Endocrinology is consulted for diabetes management.     Daughter is at bedside who assists in history taking. Both the patient and his daughter relay discomfort with corpak. Daughter relays that they are hopeful the patient will be able to swallow soon. The patient denies nausea, vomiting, abdominal pain.     Past Medical History:    Accidental fall  Angina pectoris    Arthritis    BPH (benign prostatic hyperplasia)    Cataract    Constipation    Coronary artery disease    DM (diabetes mellitus) (CMS-HCC)    ED (erectile dysfunction)    Heart attack (CMS-HCC)    Hyperlipidemia    Hypertension    Joint pain    Neuropathy    Shingles     Surgical History:   Procedure Laterality Date    CORONARY STENT PLACEMENT  2016    HX CHOLECYSTECTOMY  09/2015    COLONOSCOPY N/A 04/20/2016    Performed by Karalee Oscar, MD at [facility] ENDO    ESOPHAGOGASTRODUODENOSCOPY N/A 04/20/2016    Performed by [facility]e [facility] MD at [facility] ENDO    ESOPHAGOGASTRODUODENOSCOPY BIOPSY  04/20/2016    Performed by Karalee Oscar, MD at [facility] ENDO    UREA BREATH TEST N/A 11/13/2016    Performed by Dearl Fabry, MD at [facility] ENDO     Family history reviewed; non-contributory  Social History     Tobacco Use    Smoking status: Former     Current packs/day: 0.00     Average packs/day: 2.0 packs/day for 10.0 years (20.0 ttl pk-yrs)     Types: Cigarettes     Start date: 04/11/1961     Quit date: 04/12/1971     Years since quitting: 52.5    Smokeless tobacco: Never    Tobacco comments:     Former smoker/quit years ago   Vaping Use    Vaping status: Never Used   Substance and Sexual Activity    Alcohol use: Not Currently    Drug use: Never    Sexual activity: Not Currently     Partners: Female     Birth control/protection: None          Review of Systems:   Negative except per HPI     Physical Exam:     General:  Alert, cooperative, no distress  NG feeds running  Head:  Normocephalic, Atraumatic  Eyes:  No scleral icterus  Ext: warm, no pedal edema  Lungs:  symmetric chest rise, no tachypnea  Skin:   No rashes  Neurologic: dysarthria    Vital Signs: Last Filed In 24 Hours Vital Signs: 24 Hour Range   BP: 147/67 (05/12 0840)  Temp: 37.2 ?C (99 ?F) (05/12 0840)  Pulse: 102 (05/12 0840)  Respirations: 14 PER MINUTE (05/12 0840)  SpO2: 100 % (05/12 0840)  O2 Device: None (Room air) (05/12 0840) BP: (143-164)/(66-83)   Temp:  [36.6 ?C (97.8 ?F)-37.4 ?C (99.3 ?F)]   Pulse:  [100-110]   Respirations:  [14 PER MINUTE-18 PER MINUTE]   SpO2:  [94 %-100 %]   O2 Device: None (Room air)            Medications:  No current facility-administered medications on file prior to encounter.     No current outpatient medications on file prior to encounter.           Lab Results   Component Value Date    URMALBCRRAT 285.85 (H) 05/06/2017    TSH 0.40 06/05/2023       Chemistry and Blood Count Laboratory Flow sheet  Comprehensive Metabolic Profile        Latest Ref Rng & Units 10/17/2023     8:28 AM 10/18/2023     5:06 AM 10/19/2023     5:55 AM 10/20/2023     4:50 AM 10/21/2023  4:32 AM   CMP   Sodium 137 - 147 mmol/L  138  138  138  137    Potassium 3.5 - 5.1 mmol/L  4.4  3.8  3.9  3.8    Chloride 98 - 110 mmol/L  104  102  101  101    CO2 21 - 30 mmol/L  24  23  24  25     Anion Gap 3 - 12  10  13  13  11     Blood Urea Nitrogen 7 - 25 mg/dL  11  11  16  14     Creatinine 0.40 - 1.24 mg/dL 0.8  0.45  4.09  8.11 9.14    Glucose 70 - 100 mg/dL  782  956  213  086    Calcium 8.5 - 10.6 mg/dL  9.1  9.2  9.3  9.4    GFR >60 mL/min  >60  >60  >60  >60         Complete Blood Count with Differential       Latest Ref Rng & Units 10/17/2023     8:27 AM 10/18/2023     5:06 AM 10/19/2023     5:55 AM 10/20/2023     4:50 AM 10/21/2023     4:32 AM   CBC with Diff   WBC 4.50 - 11.00 10*3/uL 6.70  11.60  7.40  11.50  11.90    RBC 4.40 - 5.50 10*6/uL 4.62  4.77  4.90  5.06  5.21    Hemoglobin 13.5 - 16.5 g/dL 57.8  46.9  62.9  52.8  15.5    Hematocrit 40.0 - 50.0 % 39.9  41.4  43.6  45.6  46.3    MCV 80.0 - 100.0 fL 86.5  86.9  89.1  90.1  88.9    MCH 26.0 - 34.0 pg 30.6  30.1  30.6  29.8  29.8    MCHC 32.0 - 36.0 g/dL 41.3  24.4  01.0  27.2  33.5    RDW 11.0 - 15.0 % 12.6  13.0  12.7  13.2  12.9    Platelet Count 150 - 400 10*3/uL 177  191  197  213  218    MPV 7.0 - 11.0 fL 9.4  9.2  9.2  9.2  9.0    Neurtrophils 41.0 - 77.0 % 59.3        Absolute Neutrophils 1.80 - 7.00 10*3/uL 4.00        Absolute Lymph Count 1.00 - 4.80 10*3/uL 2.10        Absolute Monocyte Count 0.00 - 0.80 10*3/uL 0.50        Eosinophils 0.0 - 5.0 % 2.2        Absolute Eosinophil Count 0.00 - 0.45 10*3/uL 0.10        Basophils 0.0 - 2.0 % 1.0             Most recent Lipid Profile Lipid Profile trend   Lab Results   Component Value Date    CHOL 216 (H) 10/17/2023    TRIG 219 (H) 10/17/2023    HDL 36 (L) 10/17/2023    LDL 161 (H) 10/17/2023    VLDL 43.8 10/17/2023    NONHDLCHOL 180 10/17/2023    Lab Results   Component Value Date    CHOL 216 (H) 10/17/2023    CHOL 184 06/05/2023    CHOL 159 09/01/2015    TRIG 219 (H) 10/17/2023    TRIG  207 (H) 06/05/2023    TRIG 299 (H) 09/01/2015    HDL 36 (L) 10/17/2023    HDL 27 (L) 06/05/2023    HDL 27 (L) 09/01/2015    LDL 161 (H) 10/17/2023    LDL 119 (H) 06/05/2023    LDL 84 09/01/2015    VLDL 16.1 10/17/2023    VLDL 41.4 06/05/2023    VLDL 60 09/01/2015    NONHDLCHOL 180 10/17/2023    NONHDLCHOL 157 06/05/2023    NONHDLCHOL 132 09/01/2015          Lab Results   Component Value Date    CHOL 216 (H) 10/17/2023    TRIG 219 (H) 10/17/2023    HDL 36 (L) 10/17/2023    LDL 161 (H) 10/17/2023    VLDL 43.8 10/17/2023    NONHDLCHOL 180 10/17/2023          Lab Results   Component Value Date    A1C 12.2 05/01/2018    A1C 12.2 05/06/2017    A1C 11.9 11/12/2016    HGBA1C 11.7 (H) 10/17/2023    HGBA1C 11.9 (H) 06/05/2023    HGBA1C 13.6 (H) 12/04/2017         BMI Readings from Last 8 Encounters:   10/17/23 24.46 kg/m?   08/30/23 22.38 kg/m?   06/06/23 22.96 kg/m?   03/06/23 21.79 kg/m?   01/29/22 23.34 kg/m?   01/08/22 23.34 kg/m?   08/02/21 23.42 kg/m?   11/13/20 21.63 kg/m?       Wt Readings from Last 8 Encounters:   10/17/23 66.7 kg (147 lb)   08/30/23 61 kg (134 lb 7.7 oz)   06/06/23 62.6 kg (138 lb)   03/06/23 61.2 kg (135 lb)   01/29/22 67.6 kg (149 lb)   01/08/22 67.6 kg (149 lb)   08/02/21 67.8 kg (149 lb 8 oz)   11/13/20 59 kg (130 lb)

## 2023-10-21 NOTE — Case Management (ED)
 Per chart review, pt needs to work with PT. Pt continues with corpak and is NPO with ice chips only. Pt will need nutritional plan. Chest x-ray ordered this morning. WBC elevated. No other medical concerns after review. Insurance authorization is currently pending.     Catherene Close, BSN, RN   [facility] Admission Nurse (office: [phone] or voalte)

## 2023-10-22 ENCOUNTER — Inpatient Hospital Stay: Admit: 2023-10-22 | Discharge: 2023-10-22 | Payer: MEDICARE

## 2023-10-22 LAB — CBC
~~LOC~~ BKR HEMATOCRIT: 40 % — ABNORMAL LOW (ref 40.0–50.0)
~~LOC~~ BKR HEMOGLOBIN: 14 g/dL (ref 13.5–16.5)
~~LOC~~ BKR MCHC: 34 g/dL — ABNORMAL HIGH (ref 32.0–36.0)
~~LOC~~ BKR RBC COUNT: 4.6 10*6/uL (ref 4.40–5.50)
~~LOC~~ BKR WBC COUNT: 11 10*3/uL — ABNORMAL HIGH (ref 4.50–11.00)

## 2023-10-22 LAB — PLATELET P2Y12 RESPONSE: ~~LOC~~ BKR PLATELET P2Y12 RESPONSE: 138 [PRU] — ABNORMAL LOW (ref 180–335)

## 2023-10-22 LAB — POC GLUCOSE
~~LOC~~ BKR POC GLUCOSE: 179 mg/dL — ABNORMAL HIGH (ref 70–100)
~~LOC~~ BKR POC GLUCOSE: 191 mg/dL — ABNORMAL HIGH (ref 70–100)
~~LOC~~ BKR POC GLUCOSE: 252 mg/dL — ABNORMAL HIGH (ref 70–100)
~~LOC~~ BKR POC GLUCOSE: 258 mg/dL — ABNORMAL HIGH (ref 70–100)
~~LOC~~ BKR POC GLUCOSE: 283 mg/dL — ABNORMAL HIGH (ref 70–100)

## 2023-10-22 LAB — BASIC METABOLIC PANEL
~~LOC~~ BKR BLD UREA NITROGEN: 17 mg/dL — ABNORMAL LOW (ref 7–25)
~~LOC~~ BKR CHLORIDE: 101 mmol/L — ABNORMAL HIGH (ref 98–110)
~~LOC~~ BKR CREATININE: 0.7 mg/dL — ABNORMAL LOW (ref 0.40–1.24)
~~LOC~~ BKR POTASSIUM: 3.8 mmol/L — ABNORMAL LOW (ref 3.5–5.1)

## 2023-10-22 MED ORDER — METOPROLOL SUCCINATE 25 MG PO TB24
25 mg | Freq: Every day | ORAL | 0 refills | Status: DC
Start: 2023-10-22 — End: 2023-10-22

## 2023-10-22 MED ORDER — VANCOMYCIN 750 MG IVPB (VIAL/BAG ADAPTER)
750 mg | Freq: Two times a day (BID) | INTRAVENOUS | 0 refills | Status: DC
Start: 2023-10-22 — End: 2023-10-23

## 2023-10-22 MED ORDER — LACTATED RINGERS IV BOLUS
500 mL | Freq: Once | INTRAVENOUS | 0 refills | Status: CP
Start: 2023-10-22 — End: ?
  Administered 2023-10-23: 02:00:00 500 mL via INTRAVENOUS

## 2023-10-22 MED ORDER — VANCOMYCIN 2,000 MG IVPB
30 mg/kg | Freq: Once | INTRAVENOUS | 0 refills | Status: DC
Start: 2023-10-22 — End: 2023-10-22

## 2023-10-22 MED ORDER — VANCOMYCIN 1,250 MG IVPB (VIAL/BAG ADAPTER)
20 mg/kg | Freq: Once | INTRAVENOUS | 0 refills | Status: DC
Start: 2023-10-22 — End: 2023-10-23
  Administered 2023-10-23 (×2): 1250 mg via INTRAVENOUS

## 2023-10-22 MED ORDER — INSULIN REGULAR HUMAN 100 UNIT/ML (3 ML) SC INPN
20 [IU] | SUBCUTANEOUS | 0 refills | Status: DC
Start: 2023-10-22 — End: 2023-10-23
  Administered 2023-10-22 – 2023-10-23 (×4): 20 [IU] via SUBCUTANEOUS

## 2023-10-22 MED ORDER — VANCOMYCIN PHARMACY TO MANAGE
1 | 0 refills | Status: DC
Start: 2023-10-22 — End: 2023-10-23

## 2023-10-22 MED ORDER — INSULIN GLARGINE 100 UNIT/ML (3 ML) SC INJ PEN
18 [IU] | Freq: Every day | SUBCUTANEOUS | 0 refills | Status: DC
Start: 2023-10-22 — End: 2023-10-23

## 2023-10-22 MED ORDER — ROSUVASTATIN 10 MG PO TAB
40 mg | Freq: Every day | GASTROSTOMY | 0 refills | Status: DC
Start: 2023-10-22 — End: 2023-10-28
  Administered 2023-10-22 – 2023-10-28 (×6): 40 mg via GASTROSTOMY

## 2023-10-22 MED ORDER — PIPERACILLIN/TAZOBACTAM 4.5 G/100ML NS IVPB (MB+)
4.5 g | INTRAVENOUS | 0 refills | Status: CP
Start: 2023-10-22 — End: ?
  Administered 2023-10-23 – 2023-10-25 (×22): 4.5 g via INTRAVENOUS

## 2023-10-22 MED ORDER — VANCOMYCIN 1,250 MG IVPB (VIAL/BAG ADAPTER)
20 mg/kg | Freq: Two times a day (BID) | INTRAVENOUS | 0 refills | Status: DC
Start: 2023-10-22 — End: 2023-10-22

## 2023-10-22 MED ORDER — METOPROLOL TARTRATE 25 MG PO TAB
12.5 mg | Freq: Two times a day (BID) | GASTROSTOMY | 0 refills | Status: DC
Start: 2023-10-22 — End: 2023-10-23
  Administered 2023-10-23 (×2): 12.5 mg via GASTROSTOMY

## 2023-10-22 MED ORDER — ATORVASTATIN 40 MG PO TAB
80 mg | Freq: Every day | GASTROSTOMY | 0 refills | Status: DC
Start: 2023-10-22 — End: 2023-10-22

## 2023-10-22 NOTE — Case Management (ED)
 Per chart review, plan for video swallow tomorrow - pt may possibly need PEG. Pt currently NPO with corpak. Will need oral diet for IPR. Insurance authorization is currently pending.       Catherene Close, BSN, RN   [facility] Ad[facility] Nurse (office: [phone] or voalte)

## 2023-10-22 NOTE — Progress Notes
 Endocrinology Note   Name:  Kevin Obrien MRN:  [id number] Admission Date:  10/17/2023    Principal Problem:    Ischemic stroke (CMS-HCC)  Active Problems:    Diabetes mellitus (CMS-HCC)    Neuropathy    Peripheral artery disease    Essential tremor    Poorly controlled diabetes mellitus (CMS-HCC)    Gaze preference    Moderate malnutrition      Reason for Consult:     10/19/23 1048  CONSULT ENDOCRINOLOGY PHYSICIAN  ONCE        Priority: Routine   Ordering Provider: Vannie Gentile, APRN-NP      Provider: (Not yet assigned)   Question Answer Comment   Reason for consult high A1C, no management    Is this consult for diabetes management? Yes    Consult Type: Co-Management w/Signed Orders    Ordering Provider's or Responsible Teams's Phone/Pager number? Number not on file              Assessment / Plan     Admission date and time: 10/17/2023  8:24 AM     Type 2 Diabetes Mellitus uncontrolled  Insulin dose changed  Non adherance to meds  Acute stroke   Dyslipidemia   Tube feed hyperglycemia   A1c 11.7, 10/17/2023   PTA regimen: non compliant, was supposed to be on meal time insulin. Was taking very sporadically   Was not taking any PO meds either   Hypoglycemic episodes on this regimen: none   Follows up with ? PCP for diabetes management  Diabetic-complications assessment:  CAD, stroke, neuropathy       Current diet: NPO, Jevity 1.5 TF at goal 45 ml/hr  Glucocorticoids: none     Glucose values over the past 24 hours:    Estimated Creatinine Clearance: 83.8 mL/min (based on SCr of 0.73 mg/dL).    Recent Labs     10/21/23  0335 10/21/23  0625 10/21/23  0734 10/21/23  1132 10/21/23  1559 10/21/23  2138 10/22/23  0003 10/22/23  0554   GLUPOC 253* 237* 320* 344* 307* 179* 191* 252*       Diabetes Regimen Changes today based on glucose values:    Increase insulin glargine to 18u every day   Increase regular insulin 15u q6h to 20u q6h. Hold if tube feeds are held  Continue correction factor; timing adjusted to q6h to correspond to regular insulin dosing.     Goal BG 140-180.     Diabetes Plans Post Discharge  Diabetes regimen: TBD  He will most likely need insulin on discharge.   We will request for outpatient follow-up. Family would like to come to [place] [facility]. He used to see [place] [facility] and last visit was in 2020    Patient discussed with Dr. Zafar.   Neomi Banks, MD  PGY-3, Internal Medicine       Subjective:      Kevin Obrien is a 73 y.o. male with history of hypertension, diabetes, coronary artery disease status post PCI presented with an acute stroke.  History of nonadherence to medications.   Endocrinology is consulted for diabetes management.     The patient is interactive this morning. He indicates a desire to have corpak removed. We discussed its role in providing nutrition, which appeared to answer his inquiries.     Past Medical History:    Accidental fall    Angina pectoris    Arthritis    BPH (benign prostatic hyperplasia)  Cataract    Constipation    Coronary artery disease    DM (diabetes mellitus) (CMS-HCC)    ED (erectile dysfunction)    Heart attack (CMS-HCC)    Hyperlipidemia    Hypertension    Joint pain    Neuropathy    Shingles     Surgical History:   Procedure Laterality Date    CORONARY STENT PLACEMENT  2016    HX CHOLECYSTECTOMY  09/2015    COLONOSCOPY N/A 04/20/2016    Performed by Karalee Oscar, MD at [facility]    ESOPHAGOGASTRODUODENOSCOPY N/A 04/20/2016    Performed by [facility]alee [facility], [facility] at [facility] ENDO    ESOPHAGOGASTRODUODENOSCOPY BIOPSY  04/20/2016    Performed by [facility]e [facility], [facility] at [facility] ENDO    UREA BREATH TEST N/A 11/13/2016    Performed by Dearl Fabry, MD at [facility] ENDO     Family history reviewed; non-contributory  Social History     Tobacco Use    Smoking status: Former     Current packs/day: 0.00     Average packs/day: 2.0 packs/day for 10.0 years (20.0 ttl pk-yrs)     Types: Cigarettes     Start date: 04/11/1961     Quit date: 04/12/1971     Years since quitting: 52.5    Smokeless tobacco: Never    Tobacco comments:     Former smoker/quit years ago   Vaping Use    Vaping status: Never Used   Substance and Sexual Activity    Alcohol use: Not Currently    Drug use: Never    Sexual activity: Not Currently     Partners: Female     Birth control/protection: None          Review of Systems:   Negative except per HPI     Physical Exam:     General:  Alert, cooperative, no distress  NG feeds running at goal  Head:  Normocephalic, Atraumatic  Ext: warm, no pedal edema  Lungs:  symmetric chest rise, no tachypnea  Skin:   No rashes  Neurologic: dysarthria    Vital Signs: Last Filed In 24 Hours Vital Signs: 24 Hour Range   BP: 147/92 (05/13 0904)  Temp: 37.6 ?C (99.7 ?F) (05/13 9604)  Pulse: 105 (05/13 0904)  Respirations: 16 PER MINUTE (05/13 0904)  SpO2: 94 % (05/13 0904)  O2 Device: Nasal cannula (05/13 0904)  O2 Liter Flow: 2 Lpm (05/13 0904) BP: (116-158)/(47-92)   Temp:  [36.9 ?C (98.5 ?F)-37.6 ?C (99.7 ?F)]   Pulse:  [91-106]   Respirations:  [16 PER MINUTE]   SpO2:  [89 %-98 %]   O2 Device: Nasal cannula  O2 Liter Flow: 2 Lpm            Medications:  No current facility-administered medications on file prior to encounter.     No current outpatient medications on file prior to encounter.           Lab Results   Component Value Date    URMALBCRRAT 285.85 (H) 05/06/2017    TSH 0.40 06/05/2023       Chemistry and Blood Count Laboratory Flow sheet  Comprehensive Metabolic Profile        Latest Ref Rng & Units 10/18/2023     5:06 AM 10/19/2023     5:55 AM 10/20/2023     4:50 AM 10/21/2023     4:32 AM 10/22/2023     5:01 AM   CMP   Sodium 137 -  147 mmol/L 138  138  138  137  136    Potassium 3.5 - 5.1 mmol/L 4.4  3.8  3.9  3.8  3.8    Chloride 98 - 110 mmol/L 104  102  101  101  101    CO2 21 - 30 mmol/L 24  23  24  25  25     Anion Gap 3 - 12 10  13  13  11  10     Blood Urea Nitrogen 7 - 25 mg/dL 11  11  16  14  17     Creatinine 0.40 - 1.24 mg/dL 4.16  6.06  3.01  6.01  0.73    Glucose 70 - 100 mg/dL 093  235 573  220  254    Calcium 8.5 - 10.6 mg/dL 9.1  9.2  9.3  9.4  8.9    GFR >60 mL/min >60  >60  >60  >60  >60         Complete Blood Count with Differential       Latest Ref Rng & Units 10/18/2023     5:06 AM 10/19/2023     5:55 AM 10/20/2023     4:50 AM 10/21/2023     4:32 AM 10/22/2023     5:01 AM   CBC with Diff   WBC 4.50 - 11.00 10*3/uL 11.60  7.40  11.50  11.90  11.70    RBC 4.40 - 5.50 10*6/uL 4.77  4.90  5.06  5.21  4.67    Hemoglobin 13.5 - 16.5 g/dL 27.0  62.3  76.2  83.1  14.1    Hematocrit 40.0 - 50.0 % 41.4  43.6  45.6  46.3  40.9    MCV 80.0 - 100.0 fL 86.9  89.1  90.1  88.9  87.6    MCH 26.0 - 34.0 pg 30.1  30.6  29.8  29.8  30.1    MCHC 32.0 - 36.0 g/dL 51.7  61.6  07.3  71.0  34.4    RDW 11.0 - 15.0 % 13.0  12.7  13.2  12.9  12.7    Platelet Count 150 - 400 10*3/uL 191  197  213  218  207    MPV 7.0 - 11.0 fL 9.2  9.2  9.2  9.0  9.6         Most recent Lipid Profile Lipid Profile trend   Lab Results   Component Value Date    CHOL 216 (H) 10/17/2023    TRIG 219 (H) 10/17/2023    HDL 36 (L) 10/17/2023    LDL 161 (H) 10/17/2023    VLDL 43.8 10/17/2023    NONHDLCHOL 180 10/17/2023    Lab Results   Component Value Date    CHOL 216 (H) 10/17/2023    CHOL 184 06/05/2023    CHOL 159 09/01/2015    TRIG 219 (H) 10/17/2023    TRIG 207 (H) 06/05/2023    TRIG 299 (H) 09/01/2015    HDL 36 (L) 10/17/2023    HDL 27 (L) 06/05/2023    HDL 27 (L) 09/01/2015    LDL 161 (H) 10/17/2023    LDL 119 (H) 06/05/2023    LDL 84 09/01/2015    VLDL 62.6 10/17/2023    VLDL 41.4 06/05/2023    VLDL 60 09/01/2015    NONHDLCHOL 180 10/17/2023    NONHDLCHOL 157 06/05/2023    NONHDLCHOL 132 09/01/2015          Lab Results  Component Value Date    CHOL 216 (H) 10/17/2023    TRIG 219 (H) 10/17/2023    HDL 36 (L) 10/17/2023    LDL 161 (H) 10/17/2023    VLDL 43.8 10/17/2023    NONHDLCHOL 180 10/17/2023          Lab Results   Component Value Date    A1C 12.2 05/01/2018    A1C 12.2 05/06/2017    A1C 11.9 11/12/2016    HGBA1C 11.7 (H) 10/17/2023 HGBA1C 11.9 (H) 06/05/2023    HGBA1C 13.6 (H) 12/04/2017         BMI Readings from Last 8 Encounters:   10/17/23 24.46 kg/m?   08/30/23 22.38 kg/m?   06/06/23 22.96 kg/m?   03/06/23 21.79 kg/m?   01/29/22 23.34 kg/m?   01/08/22 23.34 kg/m?   08/02/21 23.42 kg/m?   11/13/20 21.63 kg/m?       Wt Readings from Last 8 Encounters:   10/17/23 66.7 kg (147 lb)   08/30/23 61 kg (134 lb 7.7 oz)   06/06/23 62.6 kg (138 lb)   03/06/23 61.2 kg (135 lb)   01/29/22 67.6 kg (149 lb)   01/08/22 67.6 kg (149 lb)   08/02/21 67.8 kg (149 lb 8 oz)   11/13/20 59 kg (130 lb)

## 2023-10-22 NOTE — Care Plan
 Problem: Discharge Planning  Goal: Participation in plan of care  Outcome: Goal Ongoing  Flowsheets (Taken 10/17/2023 1503 by Odetta Benes, RN)  Participation in Plan of Care: Involve patient/caregiver in care planning decision making  Goal: Knowledge regarding plan of care  Outcome: Goal Ongoing  Flowsheets (Taken 10/17/2023 1503 by Odetta Benes, RN)  Knowledge regarding plan of care:   Provide admission education to parent/caregiver   Provide fall prevention education   Provide infection prevention education   Provide plan of care education   Provide procedural and treatment education   Provide medication management education   Provide VTE signs and symptoms education  Goal: Prepared for discharge  Outcome: Goal Ongoing     Problem: Neurological Status, Impaired/Altered  Goal: Progress toward maximizing functional outcomes  Outcome: Goal Ongoing  Flowsheets (Taken 10/17/2023 1503 by Odetta Benes, RN)  Progress toward maximizing functional outcomes:   Manage environmental safety   Address etiologies   Promote functional status   Observe patient activities   Provide positive coping methods education   Provide functional status promotion education   Provide impaired mental status condition education to family/caregiver   Orient patient to reality   Reduce impaired mental status risk   Consider consult for impaired/altered neurological status   Establish therapeutic relationship   Provide safety precautions education  Goal: Cognitive status restored to baseline  Outcome: Goal Ongoing  Flowsheets (Taken 10/17/2023 1503 by Odetta Benes, RN)  Cognitive status restored to baseline:   Assess patient history   Reduce delirium risk   Consider consult for impaired cognitive status   Observe patient acitvities   Provide delirium signs and symptoms education   Promote rest   Orient patient to reality   Complete delirium assessment scale     Problem: High Fall Risk  Goal: High Fall Risk  Outcome: Goal Ongoing  Flowsheets (Taken 10/17/2023 1503 by Odetta Benes, RN)  High Fall Risk:   All patients will receive: High fall risk sign, yellow wristband, yellow socks, gait belt, and shower shoes   Engage bed alarm - middle setting   Stay with the patient while toileting/showering   Engage chair alarm   PT/OT consult for fall prevention assessment if scoring in Unsteady Gait or Visual or Auditory impairment   Remove excess equipment/supplies   Educate patient to use call-light if tethered   Use shower shoes   Maximize bed functionality (optimize bed height, firm/flat surface)   Use safe patient handling equipment as appropriate   Assess need for bedside commode with drop arm to be available in room     Problem: Skin Integrity  Goal: Skin integrity intact  Outcome: Goal Ongoing  Flowsheets (Taken 10/17/2023 1503 by Odetta Benes, RN)  Skin integrity intact:   Assess nutrition   Provide skin care interventions   Promote nutrition   Assure position change   Provide incontinence management interventions   Monitor skin integrity   Reduce skin shear, friction and tissue load   Provide skin self-assessment education   Consider consult for skin integrity  Goal: Healing of skin (Wound & Incision)  Outcome: Goal Ongoing  Flowsheets (Taken 10/17/2023 1503 by Odetta Benes, RN)  Healing of wound (wounds and Incisions):   Assess for signs and symptoms of wound infection   Assess wound site healing   Implement wound/incision care as ordered   Provide wound/incision care as ordered  Goal: Healing of skin (Pressure Injury)  Outcome: Goal Ongoing     Problem: Mobility/Activity Intolerance  Goal:  Maximize functional ADL's and mobility outcomes  Outcome: Goal Ongoing  Flowsheets (Taken 10/17/2023 1503 by Odetta Benes, RN)  Maximize functional ADLs and mobility outcomes:   Administer oxygen to maintain SpO2 at approprate levels   Maintain body position   Consider consult for mobility/activity intolerance   Manage environmental safety   Manage energy conservation for mobility/activity intolerance   Physical thrapy evaluation and treatment   Consider post-operative precautions   Occupational therapy evaulation and treatment   Cardiac Rehab evaluation and treatment     Problem: VTE, Risk of  Goal: Absence of venous thrombosis  Outcome: Goal Ongoing  Flowsheets (Taken 10/17/2023 1503 by Odetta Benes, RN)  Absence of venous thrombosis:   Utilize prevent measures   Assess peripheral circulation   Assess for signs and symptoms of venous thrombosis  Goal: Knowledge of warfarin regimen  Outcome: Goal Ongoing     Problem: Injury-Risk of, Non-Violent Physical Restraints  Goal: Absence of Injury while physically restrained (Non-Violent)  Outcome: Goal Ongoing     Problem: Nutrition Deficit  Goal: Adequate nutritional intake  Outcome: Goal Ongoing  Flowsheets (Taken 10/21/2023 0048)  Adequate nutritional intake: Assess nutritional status  Goal: Body weight within specified parameters  Outcome: Goal Ongoing

## 2023-10-22 NOTE — Case Management (ED)
 Case Management Progress Note    NAME:Rendell Marteze Vecchio                          MRN: [id number]              DOB:[birth year]          AGE: 73 y.o.  ADMISSION DATE: 10/17/2023             DAYS ADMITTED: LOS: 5 days      Today's Date: 10/22/2023    PLAN: Anticipate dc to [facility], pending medical stability and insurance auth.    Expected Discharge Date: 10/24/2023   Is Patient Medically Stable: No, Please explain: corpak  Are there Barriers to Discharge? no    INTERVENTION/DISPOSITION:  Discharge Planning                 SW reviewed EMR and met with NCM and Stroke team for huddle to discuss plan of care. Pt is getting video swallow today.    SW updated [facility]. [facility] Dress is pending.    SW will continue to remain available and assist with discharge needs as they arise.    Transportation              Does the Patient Need Case Management to Arrange Discharge Transport? (ex: facility, ambulance, wheelchair/stretcher, Medicaid, cab, other): No  Will the Patient Use Family Transport?: Yes  Transportation Name, Phone and Availability #1: pt's daughters  Support                 Info or Referral                 Positive SDOH Domains and Potential Barriers                   Medication Needs                                                                                                                                                         Financial                 Legal                 Other                 Discharge Disposition  Dorisann Garre    Dorisann Garre, [place]  Inpatient Social Work Case Manager  Neurology General and Stroke  Available via [facility]  Cell: [phone]

## 2023-10-22 NOTE — Progress Notes
 Pharmacy Vancomycin Note  Subjective:   Kevin Obrien is a 73 y.o. male being treated for blood stream infection, lower respiratory tract infection.    Assessment:   Target levels for this patient:  1.  AUC (mcg*h/mL):  400-600    Plan:   Will give loading dose 20mg /kg (1250mg ) x1 followed by maintenance dose of 750mg  q12hr. This is estimated to give AUC 434 and trough 12  Next scheduled level(s): At steady state, TBD by floor pharmacist  Pharmacy will continue to monitor and adjust therapy as needed.      Objective:       Drug Levels:  No results found for: VANCOMYCIN 2HR POST DOSE, VANCOMYCIN TROUGH, VANCOMYCIN TIMED    Current Vancomycin Orders   Medication Dose Route Frequency    vancomycin (VANCOCIN) 1,250 mg in sodium chloride 0.9% 250 mL IVPB (Vial/Bag Adapter)  20 mg/kg Intravenous ONCE    [START ON 10/23/2023] vancomycin (VANCOCIN) 750 mg in sodium chloride 0.9% 250 mL IVPB (Vial/Bag Adapter)  750 mg Intravenous Q12H*    vancomycin, pharmacy to manage  1 each Service Per Pharmacy       Recent Vancomycin Dosing and Administration:  Recent Vancomycin Admin        Ordered but not administered                    Start date of vancomycin therapy: 10/22/2023  Additional Abx: pip/tazo  Cultures:  ,  ,  ,  pending  White Blood Cells   Date/Time Value Ref Range Status   10/22/2023 1901 13.20 (H) 4.50 - 11.00 10*3/uL Final   10/22/2023 0501 11.70 (H) 4.50 - 11.00 10*3/uL Final   10/21/2023 0432 11.90 (H) 4.50 - 11.00 10*3/uL Final   10/20/2023 0450 11.50 (H) 4.50 - 11.00 10*3/uL Final     Creatinine   Date/Time Value Ref Range Status   10/22/2023 0501 0.73 0.40 - 1.24 mg/dL Final   16/03/9603 5409 0.61 0.40 - 1.24 mg/dL Final   81/19/1478 2956 0.76 0.40 - 1.24 mg/dL Final     Blood Urea Nitrogen   Date/Time Value Ref Range Status   10/22/2023 0501 17 7 - 25 mg/dL Final     Estimated CrCl:  83.8 mL/min    Intake/Output Summary (Last 24 hours) at 10/22/2023 1939  Last data filed at 10/22/2023 1622  Gross per 24 hour   Intake 1819 ml   Output 580 ml   Net 1239 ml      Actual Weight:  66.7 kg (147 lb)    Sidra Dredge, [facility]  10/22/2023

## 2023-10-22 NOTE — Progress Notes
 OCCUPATIONAL THERAPY  PROGRESS NOTE      Name: Kevin Obrien   MRN: [id number]     DOB: Sep 14, 1950      Age: 73 y.o.  Admission Date: 10/17/2023     LOS: 5 days     Date of Service: 10/22/2023      Mobility  Patient Turn/Position: Chair  Mobility Level [facility] Highest Level of Mobility (JH-HLM): Stand (1 or more minutes)  Level of Assistance: Assist X2  Assistive Device: Hand Held  Activity Limited By: Weakness;Fatigue    Subjective  Significant Hospital Events: 73 y.o. male with hypertension, diabetes and prior CAD s/p PCI (~10 years ago) presented with acute right facial droop, left gaze and dysarthria.  Mental / Cognitive: Alert;Oriented;Cooperative;Follows commands  Pain: No complaint of pain  Pain level: Before activity;During activity;After activity  Persons Present: Daughter;Rehabilitation technician  Comments: Pt left seated in bedside chair at end of session, all needs within reach and chair alarm in place.    Home Living Situation  Lives With: Family  Type of Home: House  Entry Stairs: 2  In-Home Stairs: Able to live on main level  Bathroom Setup: Tub/shower unit  Patient Owned Equipment: Cane: Single point    Prior Level of Function  Level Of Independence: Independent with ADL and household mobility with device  Comments: intermittent use of SPC per daughter  History of Falls in Past 3 Months: Yes    Vision  Comment: left gaze with difficulty crossing midline. patient's daughter reports patient has c/o blurred and double vision. neurology team present to assess visual deficits during therapy session    ADL's  Where Assessed: Chair  LE Dressing Assist: Total Assist  LE Dressing Deficits: Don/Doff R Sock;Don/Doff L Sock    ADL Mobility  Bed Mobility: Supine to Sit: Maximum assist  Bed Mobility Comments: Requires increased time. Assist with both trunk and BLE toward edge of bed.  Transfer Type: Sit to stand  Transfer: Assistance Level: From;Bed;Minimal assist;x2 people  Transfer: Assistive Device: Hand hold assist  Transfer: Type of Assistance: Elevated bed;For balance;For safety considerations  Other Transfer Type: Sit to stand;Squat pivot  Other Transfer: Assistance Level: To;Bedside chair;Maximum assist  Other Transfer: Assistive Device: Hand hold assist  Other Transfer: Type of Assistance: Verbal cues;For balance;For safety considerations  End of Activity Status: Up in chair;Instructed patient to use call light  Transfer Comments: Pt with significant forward flexed posutre in standing. Utilized the mirror to improve posture. Sit to stand varried from minimum assist x2 to maximum assist x1. Pt unable to sequence stand pivot, completed squat pivot to chair.  Gait Distance: 2 feet  Gait: Assistance Level: Moderate assist;of 1st person;Standby assist;of 2nd person (chair follow)  Gait: Assistive Device: Hand hold assist (hallway rail)    Activity Tolerance  Endurance: 2/5 Tolerates 10-20 Minutes Exercise w/Multiple Rests    Cognition  Expression: Expressive Aphasia;Mumbling/Slurred  Social Interaction: Interacts in a Spontaneous,Cooperative Manner  Problem Solving: Cueing to Sequence Task;Decreased Judgment/Safety;Direction Following Assist  Orientation: Alert & Oriented x3;To Person;To Place;To Situation  Attention: Awake/Alert    ROM  R UE ROM: WFL   R UE ROM Method: Active  L UE ROM: WFL   L UE ROM Method: Active    Strength / Tone  R UE Strength: WFL  L UE Strength: WFL    Assessment  Prognosis: Good;w/Cont OT s/p Acute Discharge  Goal Formulation: Patient  Comments: Pt will continue to benefit from OT skilled services during acute care  admission to increase and maximize safety and independence with ADLs/ADL related mobility.    AM-PAC 6 Clicks Daily Activity Inpatient  Putting on and taking off regular lower body clothes: A Lot  Bathing (Including washing, rinsing, drying): A Lot  Toileting, which includes using toilet, bedpan, or urinal: A Lot  Putting on and taking off regular upper body clothing: A Little  Taking care of personal grooming such as brushing teeth: A Little  Eating meals: A Little  Daily Activity Raw Score: 15  Standardized (T-scale) Score: 34.69    Plan  OT Frequency: 5x/week  OT Plan for Next Visit: outcome measure, toileting to bedside commode, progress mobility    ADL Goals  Patient Will Perform All ADL's: w/ Stand By Assist    Functional Transfer Goals  Pt Will Perform All Functional Transfers: w/ Stand By Assist    OT Discharge Recommendations  Recommendation: Inpatient setting;Recommend rehab medicine consult  Patient Currently Requires Physical Assist With: All mobility;All personal care ADLs;All home functioning ADLs    Therapist: Jules Oar, OTR/L [id number]  Date: 10/22/2023

## 2023-10-22 NOTE — Progress Notes
 Vascular Neurology Progress Note      Kevin Obrien  Admission Date: 10/17/2023  LOS: 5 days     Assessment/Plan:  Principal Problem:    Ischemic stroke (CMS-HCC)  Active Problems:    Diabetes mellitus (CMS-HCC)    Neuropathy    Peripheral artery disease    Essential tremor    Poorly controlled diabetes mellitus (CMS-HCC)    Gaze preference    Moderate malnutrition      Kevin Obrien is a 73 y.o. male with hypertension, diabetes and prior CAD s/p PCI (~10 years ago) presented with acute right facial droop, left gaze and dysarthria and was stroke activated. Initial NIH was 6, and the LKW was the night before. His BP at arrival was 210/85. CT head was negative for acute changes, CTA H&N showed no LVO but significant calcified athero and diffuse intracranial vascular stenoses.  He did not receive TNK due to LKW time and no EVT due to no LVO.  He was loaded with Aspirin and Plavix  with plan for DAPT for 3 months. A1c was 11.7%, endocrine consulted.  ECHO showed a PFO with shunt, but ultrasound of the lower extremities was without DVT. MRI brain showed a right sided pontomedullary brainstem infarct.  In light of the results of the MRI, all his clinical symptoms (dysphagia, facial weakness, gaze deviation) can be explained with the stroke location. He still has corpak, will work with SLP for swallow recs.  Pending d/c to [facility] when medically stable.      Neuro exam:  AOX4, left gaze preference, unable to cross midline, strength 5/5, inatct FTN, gait not assessed, clear and fluent speech, sensation intact      Acute R pontomedullary infarct  Hx of CAD s/p PCI  Diffuse intracranial athero  Right sided facial weakness (upper and lower)  Forced left gaze deviation  Dysphagia    NeuroImaging  CT Head  1.  No acute intracranial hemorrhage or mass effect.   2.  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   3.  Additional cerebral hypodensities, likely secondary to chronic microvascular ischemic changes.   CTA head  1.  Multifocal stenosis of the anterior, middle, and posterior cerebral arteries most likely secondary to significant intracranial atherosclerosis. No large vessel central occlusion is identified   2.  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   CTA neck  1.  Bilateral atherosclerotic plaque at the carotid bulbs and origins of the ICAs without significant/high-grade stenosis of the ICAs by NASCET criteria.   2. 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   3. 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   MRI Brain  1.  Small acute right pontomedullary brainstem infarct   2.  Stable old left basal ganglia infarct and old left thalamic and right supratentorial white matter lacunar infarcts.   3.  Stable generalized cerebral volume loss and moderate patchy supratentorial white matter FLAIR hyperintensities, likely due to chronic microvascular ischemia.   4.  Intracranial right vertebral stenotic-occlusive disease, better assessed on CTA.     Stroke work up  A1c:  11.7  Fasting Lipid Profile: TG 219, Chol 216, LDL 161  TTE: EF of  65%, bubble study shows a Right to Left Shunting consistent with  a PFO, no thrombus/valvular vegetation, LA index WNL  Hypercoagulable work up: not indicated    Stroke Etiology - large artery athero      PLAN 5/13:   > P2Y12 pending  > Chest XRAY ordered for tachycardia, low grade fever and possible aspiration   > Video swallow planned for 5/14, will determine need for PEG at that time  > Continue DAPT with aspirin 81 mg daily and clopidogrel 75 mg daily for 90 days followed by aspirin 81 mg indefinitely for secondary stroke prevention  > Continue atorvastatin 40 mg daily  > PT OT recommending rehab      Hypertension  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 after evaluation on 10/21/2023  > Tube feeds via Corpak  > Lidocaine jelly, phenol throat spray for Corpak related discomfort  > Reevaluation by video swallow on 10/23/2023 -May need PEG tube  > CXR on 5/12 did not reveal any aspiration pneumonia      DVT Prophylaxis: Lovenox 40mg  qday   Anticoagulation: none  PT/OT recs: [facility]  Diet: N.p.o., tube feeds via Corpak  Dispo: Pending swallow evaluation  Code: Full      Patient seen and discussed with Dr. Alonso Obrien who helped formulate the assessment and plan.    Kevin Harries, APRN-NP  Vascular Neurology  Available on Voalte     _______________________________________________________________________    Subjective:  Family at bedside.  Patient tearful this a.m.-was working with occupational therapy-added lidocaine jelly for palpated discomfort near his nose.      Objective:                  Vital Signs:  Last Filed                   Vital Signs: 24 Hour Range   BP: 116/47 (05/13 0500)  Temp: 36.9 ?C (98.5 ?F) (05/13 0500)  Pulse: 91 (05/13 0500)  Respirations: 16 PER MINUTE (05/13 0500)  SpO2: 94 % (05/13 0529)  O2 Device: Nasal cannula (05/13 0529)  O2 Liter Flow: 2 Lpm (05/13 0529)  BP: (116-158)/(47-67)   Temp:  [36.9 ?C (98.5 ?F)-37.6 ?C (99.7 ?F)]   Pulse:  [91-106]   Respirations:  [14 PER MINUTE-16 PER MINUTE]   SpO2:  [89 %-100 %]   O2 Device: Nasal cannula  O2 Liter Flow: 2 Lpm    Intensity Pain Scale (Self Report): (not recorded)    Intake/Output Summary: (Last 24 hours)    Intake/Output Summary (Last 24 hours) at 10/22/2023 0751  Last data filed at 10/22/2023 0514  Gross per 24 hour   Intake 1418 ml   Output 450 ml   Net 968 ml         Medications:  Scheduled Meds:amLODIPine (NORVASC) tablet 10 mg, 10 mg, Per NG tube, QDAY  aspirin chewable tablet 81 mg, 81 mg, Per NG tube, QDAY  atorvastatin (LIPITOR) tablet 40 mg, 40 mg, Per NG tube, QDAY  clopiDOGreL (PLAVIX) tablet 75 mg, 75 mg, Per NG tube, QDAY  enoxaparin (LOVENOX) syringe 40 mg, 40 mg, Subcutaneous, QDAY(21)  insulin aspart (U-100) (NOVOLOG FLEXPEN U-100 INSULIN)  injection PEN 0-12 Units, 0-12 Units, Subcutaneous, Q6H  insulin glargine (LANTUS SOLOSTAR U-100 INSULIN) injection PEN 15 Units, 15 Units, Subcutaneous, QDAY  insulin regular human (NOVOLIN R FLEXPEN) injection PEN 15 Units, 15 Units, Subcutaneous, Q6H  lisinopriL (ZESTRIL) tablet 5 mg, 5 mg, Feeding Tube, QDAY  Protein Supplement Packets, , SEE ADMIN INSTRUCTIONS, QDAY  thiamine mononitrate (vit B1) tablet 100 mg, 100 mg, Per NG tube, QDAY    Continuous Infusions:   Diet Enteral Feeding Standard Infusion 45 mL/hr at 10/21/23 0900     PRN and Respiratory Meds:acetaminophen Q4H PRN **OR** acetaminophen Q4H PRN, dextrose 50% (D50) IV PRN, lidocaine hcl PRN, pancrelipase 20,880 Units/sodium bicarbonate 650 mg ([place] CLOG DESTROYER) PRN (On Call from Rx), phenoL PRN       Neuro exam:   Mental status: alert and awake  Speech: Fluent, dysarthria   Cranial Nerves:    Normal Abnormal   II Pupils reactive, visual fields normal    III, IV, VI  Forced gaze deviation to the left, not reaching midline   V Sensation nml V1-V3    VII     VIII nml to conversation    IX, X +strong cough    XI Equal shoulder shrug    XII Tongue midline    Muscle/motor: 5/5 in all   Sensation: Normal, no abnormalities   Coordination: intact  Gait and Sation: Seen walking with PT; leaning towards the left. Needing assistance.    Laboratory Review:  No results found for: Shermon Divine, O2SATACAL  Lab Results   Component Value Date    HGB 14.1 10/22/2023    HCT 40.9 10/22/2023    PLTCT 207 10/22/2023    WBC 11.70 (H) 10/22/2023    NEUT 59.3 10/17/2023    ANC 4.00 10/17/2023     Lab Results   Component Value Date    NA 136 (L) 10/22/2023    K 3.8 10/22/2023    CL 101 10/22/2023    CO2 25 10/22/2023    GAP 10 10/22/2023    BUN 17 10/22/2023    CR 0.73 10/22/2023    GLU 236 (H) 10/22/2023    CA 8.9 10/22/2023    MG 1.8 10/21/2023    LACTIC 1.2 08/31/2015    TOTPROT 6.9 10/17/2023    AST 17 10/17/2023    ALT 20 10/17/2023    ALKPHOS 55 10/17/2023     Lab Results   Component Value Date    TNI 0.00 02/22/2019     Lab Results   Component Value Date    PTT 27.6 10/17/2023    INR 1.0 (L) 10/17/2023    DIMER 254 02/22/2019     Lab Results   Component Value Date    TSH 0.40 06/05/2023     Lab Results   Component Value Date    CHOL 216 (H) 10/17/2023    TRIG 219 (H) 10/17/2023    HDL 36 (L) 10/17/2023    LDL 161 (H) 10/17/2023    VLDL 43.8 10/17/2023     No results found for: VANPK, VANTR  No results found for: CYCLOSPOR, TACROLIMUS, FREEPHENY     Point of Care Testing:  (Last 24 hours):  FSBS (Manual): (!) 291  Glucose: (!) 236  POC Glucose (Download): (!) 252    Radiology and Other Diagnostics Reviewed      Vannie Gentile, APRN-NP   Vascular Neurology

## 2023-10-22 NOTE — Consults
 CARDIOLOGY CONSULT NOTE      Kevin Obrien  Admission Date:  10/17/2023                     Assessment & Recs   Kevin Obrien is a 73 y.o. 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 male who was admitted for concern for stroke on 10/17/2023. He has a past medical history of coronary artery disease (NSTEMI April 2015 with PCI to diagonal branch), diabetes type 2, hypertension, hyperlipidemia and peripheral vascular disease.    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  Occasionally he will miss doses of his medications and insulin.  He presented with acute right facial droop, left gaze and dysarthria, stroke team was activated.  CT of his head was negative for acute changes.  CTA of his head and neck did not show any large vessel occlusion but significant calcified atherosclerotic plaque and diffuse intracranial vascular stenosis.  He did not receive TNK, he was loaded with aspirin and Plavix with plan for DAPT for 3 months.  A1c was checked and was 11.7%, endocrine has been consulted.  Echo was obtained on admission demonstrating a PFO with shunt and LVOT obstruction.  MRI of his brain also showed right-sided pontine medullary brainstem infarct.    Cardiology consulted on 5/12 (with brief recommendations, seen on 5/13) due to LVOT obstruction seen on his echocardiogram. Echo done on 10/17/23 demonstrated LVOT gradient of 30 mmHg which increased to 41 mmHg with Valsalva and systolic anterior motion of mitral valve  (SAM) also present.  Normal septal wall motion, nonspecific mitral valve thickening, interventricular septum size only slightly elevated at 1.2 cm.  No contractility abnormality seen, these findings similar to echo that was completed in December 2024. No previous history of mitral valve replacement. Per Dr. Josph Nimrod recommendation on 5/12, he was given 1L of LR on 5/12. He is not currently on an inotrops or vasodilators.     PTA cardiac medications: Amlodipine 10 mg daily, atorvastatin 40 mg daily, carvedilol 6.25 mg twice daily, hydrochlorothiazide 25 mg daily, lisinopril 40 mg daily. He sees Dr. Roderick Civatte with [facility], he was last seen in clinic on 07/19/23.           Assessment:  LVOT obstruction   Systolic anterior motion of the mitral valve with dynamic LVOT obstruction, baseline gradient increased to 30 mmHg from 27 mmHg from December 2024 echo; small LV cavity  Interventricular septum size only slightly increased at 1.2, unchanged from echo December 2024  Received 1L of LR on 5/12; likely hypovolemia worsening the gradient   Not currently on beta blocker therapy   Acute right pontomedullary infarct  Details outlined in HPI   Will continue on DAPT with ASA and clopidogrel 75 mg for 3 months  Coronary artery disease with history of NSTEMI with PCI to diagonal branch in April 2015  Known residual disease   Had ischemic evaluation in  2020; normal   Uncontrolled diabetes type 2   A1C 11% during this admit   Insulin dependent prior to admission; some medication non-compliance   Peripheral vascular disease  Hypertension  Hyperlipidemia  His most recent lipid panel included:  Lab Results   Component Value Date    LDL 161 (H) 10/17/2023    HDL 36 (L) 10/17/2023    CHOL 216 (H) 10/17/2023    TRIG 219 (H) 10/17/2023    AST 17 10/17/2023    ALT 20 10/17/2023   PTA on Atorvastatin 40mg  daily; LDL goal <70; currently not at goal     Peripheral vascular disease   Aortic valve sclerosis        Recommendations:  LVOT obstruction with SAM present on echo on this admission and December 2024; no contractility abnormalities or decrease in EF to suggest Takotsubo cardiomyopathy, increase in interventricular septum; likely related to hypovolemia with worsening LVOT gradients; recommend 1.5-2L fluid while he is on tube feeds.    Recommend low dose beta blockade to reduce afterload; was on Carvedilol PTA, preferably would switch to low dose Metoprolol (ideally XL, but getting tube feeds, cannot be crushed) and start Metoprolol tartrate 12.5mg  BID (ordered). Will continue to monitor blood pressure closely.   Continue with aggressive risk factor modification for stroke risk reduction; already on Atorvastatin 40mg  daily but LDL 161; switch to Rosuvastatin 40mg  daily (done).    Consider MCOT on time of discharge for evaluation of possible atrial fibrillation with recent stroke (order event monitor specify 30 day MCOT).   Continue to monitor on telemetry.   Follow-up with cardiologist at [facility]- Dr. Roderick Civatte in 3-4 weeks with repeat echo at that time to reassess LVOT gradient. They can follow-up on results of his heart monitor.     Thank you for the opportunity to participate in the care of your patient.  Please call with questions or concerns.    Thank you for the Consult.  Will follow along.  Discussed with Cardiology attending.  Please see attestation by Dr. Alline Ivans for final recommendations or changes to above plan.       Jeppie Moles, DNP, APRN, FNP-C  Cardiology Consult Service   Available on Voalte and AMS    _____________________________________________________________________________  Cardiac testing and imaging       CXR: 10/21/23  No acute cardiopulmonary abnormality    ECG: 10/17/23: Normal sinus rhythm with right bundle branch block    Echocardiograms: 10/17/23  Normal LV systolic function with an estimated EF of 65%.  Basal septum appears to be mildly thickened.  Systolic anterior motion of the mitral leaflets is noted on some views.  Baseline LVOT gradient is 30 mmHg, increasing slightly to 40 mmHg with Valsalva.  Normal RV size and function  Normal-sized atria  Trivial MR is present  Bubble study shows right-to-left shunting consistent with a PFO   Nondilated IVC    Telemetry monitor: sinus tachycardia     Stress tests: Regadenoson MPI 02/23/2019:  SUMMARY/OPINION:  This study is normal with no evidence of significant   myocardial ischemia. Left ventricular systolic function is normal. There   are no high risk prognostic indicators present.  The pharmacologic ECG   portion of the study is negative for ischemia.   There are no prior studies available for comparison.   In aggregate the current study is low risk in regards to predicted annual   cardiovascular mortality rate.       Home Medications  No medications prior  to admission.       Current Medications  Scheduled Meds:amLODIPine (NORVASC) tablet 10 mg, 10 mg, Per NG tube, QDAY  aspirin chewable tablet 81 mg, 81 mg, Per NG tube, QDAY  atorvastatin (LIPITOR) tablet 40 mg, 40 mg, Per NG tube, QDAY  clopiDOGreL (PLAVIX) tablet 75 mg, 75 mg, Per NG tube, QDAY  enoxaparin (LOVENOX) syringe 40 mg, 40 mg, Subcutaneous, QDAY(21)  insulin aspart (U-100) (NOVOLOG FLEXPEN U-100 INSULIN) injection PEN 0-12 Units, 0-12 Units, Subcutaneous, Q6H  insulin glargine (LANTUS SOLOSTAR U-100 INSULIN) injection PEN 15 Units, 15 Units, Subcutaneous, QDAY  insulin regular human (NOVOLIN R FLEXPEN) injection PEN 15 Units, 15 Units, Subcutaneous, Q6H  lisinopriL (ZESTRIL) tablet 5 mg, 5 mg, Feeding Tube, QDAY  Protein Supplement Packets, , SEE ADMIN INSTRUCTIONS, QDAY  thiamine mononitrate (vit B1) tablet 100 mg, 100 mg, Per NG tube, QDAY    Continuous Infusions:   Diet Enteral Feeding Standard Infusion 45 mL/hr at 10/21/23 0900     PRN and Respiratory Meds:acetaminophen Q4H PRN **OR** acetaminophen Q4H PRN, dextrose 50% (D50) IV PRN, lidocaine hcl PRN, pancrelipase 20,880 Units/sodium bicarbonate 650 mg ([place] CLOG DESTROYER) PRN (On Call from Rx), phenoL PRN       Allergies  Allergies   Allergen Reactions    Seasonal Allergies RHINITIS       Past Medical History  Past Medical History:    Accidental fall    Angina pectoris    Arthritis    BPH (benign prostatic hyperplasia)    Cataract    Constipation    Coronary artery disease    DM (diabetes mellitus) (CMS-HCC)    ED (erectile dysfunction)    Heart attack (CMS-HCC)    Hyperlipidemia    Hypertension    Joint pain    Neuropathy    Shingles       Past Surgical History  Surgical History:   Procedure Laterality Date    CORONARY STENT PLACEMENT  2016    HX CHOLECYSTECTOMY  09/2015    COLONOSCOPY N/A 04/20/2016    Performed by Karalee Oscar, MD at [facility] END[facility]    ESOPHAGOGASTRODUODENOSCOPY N/A 04/20/2016    Performed by Karalee Oscar, MD at [facility]    ESOPHAGOGASTRODUODENOSCOPY BIOPSY  04/20/2016    Performed by Karalee Oscar, MD at [facility] ENDO    UREA BREATH TEST N/A 11/13/2016    Performed by Dearl Fabry, MD at [facility] ENDO       Social History  Social History     Socioeconomic History    Marital status: Separated    Number of children: 3   Tobacco Use    Smoking status: Former     Current packs/day: 0.00     Average packs/day: 2.0 packs/day for 10.0 years (20.0 ttl pk-yrs)     Types: Cigarettes     Start date: 04/11/1961     Quit date: 04/12/1971     Years since quitting: 52.5    Smokeless tobacco: Never    Tobacco comments:     Former smoker/quit years ago   Vaping Use    Vaping status: Never Used   Substance and Sexual Activity    Alcohol use: Not Currently    Drug use: Never    Sexual activity: Not Currently     Partners: Female     Birth control/protection: None       Family History  Family History   Problem Relation Name Age of Onset    Diabetes Mother AMPARO CUBIAS  Diabetes Father JOSE Winzer     Cancer-Lung Father JOSE Borrelli     Melanoma Neg Hx      Cataract Neg Hx      Glaucoma Neg Hx      Macular Degen Neg Hx         Review of Systems    14 point ROS completed and negative except as noted per HPI    Physical Exam:  Vital Signs: Last Filed In 24 Hours Vital Signs: 24 Hour Range   BP: 116/47 (05/13 0500)  Temp: 36.9 ?C (98.5 ?F) (05/13 0500)  Pulse: 91 (05/13 0500)  Respirations: 16 PER MINUTE (05/13 0500)  SpO2: 94 % (05/13 0529)  O2 Device: Nasal cannula (05/13 0529)  O2 Liter Flow: 2 Lpm (05/13 0529) BP: (116-158)/(47-67)   Temp:  [36.9 ?C (98.5 ?F)-37.6 ?C (99.7 ?F)]   Pulse:  [91-106]   Respirations:  [14 PER MINUTE-16 PER MINUTE]   SpO2:  [89 %-100 %]   O2 Device: Nasal cannula  O2 Liter Flow: 2 Lpm       Intake/Output Summary (Last 24 hours) at 10/22/2023 0813  Last data filed at 10/22/2023 0514  Gross per 24 hour   Intake 1418 ml   Output 450 ml   Net 968 ml        Physical Exam    BP 116/47 (BP Source: Arm, Right Upper)  - Pulse 91  - Temp 36.9 ?C (98.5 ?F)  - Ht 165.1 cm (5' 5)  - Wt 66.7 kg (147 lb)  - SpO2 94%  - BMI 24.46 kg/m?   GENERAL APPEARANCE: Patient in no apparent distress.  PSYCH:  unable to assess; aphasia present  NEURO:  difficult to assess due to aphasia   VESSELS: JVD is not elevated above the sternal notch.  ENT: Moist mucous membranes- Corkpak present   CARDIOVASCULAR:   No parasternal lift Regular rhythm and normal rate. S1 and S2 present.   Systolic murmur .  There is no pericardial rub  GI: Abdomen  nondistended No appreciable hepatomegaly in the upright position  RESPIRATORY:  rhonchi noted  EXTREMITIES: There was no    lower extremity edema   SKIN: No rash on chest  GAIT:  did not witness patient ambulate      Lab/Radiology/Other Diagnostic Tests:  CBC w/Diff    Lab Results   Component Value Date/Time    WBC 11.70 (H) 10/22/2023 05:01 AM    RBC 4.67 10/22/2023 05:01 AM    HGB 14.1 10/22/2023 05:01 AM    HCT 40.9 10/22/2023 05:01 AM    MCV 87.6 10/22/2023 05:01 AM    MCH 30.1 10/22/2023 05:01 AM    MCHC 34.4 10/22/2023 05:01 AM    RDW 12.7 10/22/2023 05:01 AM    PLTCT 207 10/22/2023 05:01 AM    MPV 9.6 10/22/2023 05:01 AM    Lab Results   Component Value Date/Time    NEUT 59.3 10/17/2023 08:27 AM    ANC 4.00 10/17/2023 08:27 AM    LYMA 30.6 10/17/2023 08:27 AM    ALC 2.10 10/17/2023 08:27 AM    MONA 6.9 10/17/2023 08:27 AM    AMC 0.50 10/17/2023 08:27 AM    EOSA 2.2 10/17/2023 08:27 AM    AEC 0.10 10/17/2023 08:27 AM    BASA 1.0 10/17/2023 08:27 AM    ABC 0.10 10/17/2023 08:27 AM       Comprehensive Metabolic Profile    Lab Results   Component Value Date/Time  NA 136 (L) 10/22/2023 05:01 AM    K 3.8 10/22/2023 05:01 AM    CL 101 10/22/2023 05:01 AM    CO2 25 10/22/2023 05:01 AM    GAP 10 10/22/2023 05:01 AM    BUN 17 10/22/2023 05:01 AM    CR 0.73 10/22/2023 05:01 AM    GLU 236 (H) 10/22/2023 05:01 AM    Lab Results   Component Value Date/Time    CA 8.9 10/22/2023 05:01 AM    ALBUMIN 4.1 10/17/2023 08:27 AM    TOTPROT 6.9 10/17/2023 08:27 AM    ALKPHOS 55 10/17/2023 08:27 AM    AST 17 10/17/2023 08:27 AM    ALT 20 10/17/2023 08:27 AM    TOTBILI 0.7 10/17/2023 08:27 AM    GFR >60 10/22/2023 05:01 AM    GFRAA >60 02/22/2019 06:28 PM       Thyroid Studies    Lab Results   Component Value Date/Time    TSH 0.40 06/05/2023 08:09 PM    Lab Results   Component Value Date/Time    TNI 0.00 02/22/2019 11:41 PM

## 2023-10-22 NOTE — Care Plan
 Problem: Discharge Planning  Goal: Knowledge regarding plan of care  10/22/2023 1824 by Alison Irvine, RN  Outcome: Goal Ongoing  Flowsheets (Taken 10/22/2023 1211)  Knowledge regarding plan of care:   Provide plan of care education   Provide fall prevention education   Provide infection prevention education   Provide medication management education   Provide VTE signs and symptoms education  10/22/2023 1211 by Alison Irvine, RN  Outcome: Goal Ongoing  Flowsheets (Taken 10/22/2023 1211)  Knowledge regarding plan of care:   Provide plan of care education   Provide fall prevention education   Provide infection prevention education   Provide medication management education   Provide VTE signs and symptoms education     Problem: Neurological Status, Impaired/Altered  Goal: Progress toward maximizing functional outcomes  10/22/2023 1824 by Alison Irvine, RN  Outcome: Goal Ongoing  Flowsheets (Taken 10/22/2023 1211)  Progress toward maximizing functional outcomes:   Address etiologies   Establish therapeutic relationship   Manage environmental safety   Consider consult for impaired/altered neurological status   Promote functional status   Reduce impaired mental status risk   Observe patient activities   Orient patient to reality   Provide positive coping methods education   Provide impaired mental status condition education to family/caregiver   Provide functional status promotion education   Provide safety precautions education   Provide stroke risk factors, warning signs and symptoms education  10/22/2023 1211 by Alison Irvine, RN  Outcome: Goal Ongoing  Flowsheets (Taken 10/22/2023 1211)  Progress toward maximizing functional outcomes:   Address etiologies   Establish therapeutic relationship   Manage environmental safety   Consider consult for impaired/altered neurological status   Promote functional status   Reduce impaired mental status risk   Observe patient activities   Orient patient to reality   Provide positive coping methods education   Provide impaired mental status condition education to family/caregiver   Provide functional status promotion education   Provide safety precautions education   Provide stroke risk factors, warning signs and symptoms education  Goal: Cognitive status restored to baseline  10/22/2023 1824 by Alison Irvine, RN  Outcome: Goal Ongoing  Flowsheets (Taken 10/22/2023 1211)  Cognitive status restored to baseline:   Assess patient history   Complete delirium assessment scale   Reduce delirium risk   Orient patient to reality   Promote rest   Consider consult for impaired cognitive status   Observe patient acitvities   Provide delirium signs and symptoms education  10/22/2023 1211 by Alison Irvine, RN  Outcome: Goal Ongoing  Flowsheets (Taken 10/22/2023 1211)  Cognitive status restored to baseline:   Assess patient history   Complete delirium assessment scale   Reduce delirium risk   Orient patient to reality   Promote rest   Consider consult for impaired cognitive status   Observe patient acitvities   Provide delirium signs and symptoms education     Problem: High Fall Risk  Goal: High Fall Risk  10/22/2023 1824 by Alison Irvine, RN  Outcome: Goal Ongoing  Flowsheets (Taken 10/22/2023 1211)  High Fall Risk:   All patients will receive: High fall risk sign, yellow wristband, yellow socks, gait belt, and shower shoes   Engage bed alarm - middle setting   Engage chair alarm   Stay with the patient while toileting/showering   PT/OT consult for fall prevention assessment if scoring in Unsteady Gait or Visual or Auditory impairment   Remove excess equipment/supplies   Educate patient to use call-light if  tethered   Use shower shoes   Maximize bed functionality (optimize bed height, firm/flat surface)   Use safe patient handling equipment as appropriate   Assess need for bedside commode with drop arm to be available in room   Assess need for: quick release belt, PSM (video monitoring), assist x2 using clinical staff/equipment  10/22/2023 1211 by Alison Irvine, RN  Outcome: Goal Ongoing  Flowsheets (Taken 10/22/2023 1211)  High Fall Risk:   All patients will receive: High fall risk sign, yellow wristband, yellow socks, gait belt, and shower shoes   Engage bed alarm - middle setting   Engage chair alarm   Stay with the patient while toileting/showering   PT/OT consult for fall prevention assessment if scoring in Unsteady Gait or Visual or Auditory impairment   Remove excess equipment/supplies   Educate patient to use call-light if tethered   Use shower shoes   Maximize bed functionality (optimize bed height, firm/flat surface)   Use safe patient handling equipment as appropriate   Assess need for bedside commode with drop arm to be available in room   Assess need for: quick release belt, PSM (video monitoring), assist x2 using clinical staff/equipment

## 2023-10-22 NOTE — Progress Notes
 SPEECH-LANGUAGE PATHOLOGY  NO TREATMENT NOTE     Attempted to see pt for dysphagia tx. Pt unable to participate in tx 2/2 ? Hallucinations (pt stating he had screwdriver he dropped upon therapist entrance. Digging through bed and leaning over to locate on floor.) Unable to answer orientation questions for this therapist. Notified RN and NP.     Therapist: Raymondo Calin, SLP MA, [facility] Voalte# [phone]  Date: 10/22/2023

## 2023-10-22 NOTE — Care Plan
 Problem: Discharge Planning  Goal: Knowledge regarding plan of care  Outcome: Goal Ongoing  Flowsheets (Taken 10/22/2023 1211)  Knowledge regarding plan of care:   Provide plan of care education   Provide fall prevention education   Provide infection prevention education   Provide medication management education   Provide VTE signs and symptoms education     Problem: Neurological Status, Impaired/Altered  Goal: Progress toward maximizing functional outcomes  Outcome: Goal Ongoing  Flowsheets (Taken 10/22/2023 1211)  Progress toward maximizing functional outcomes:   Address etiologies   Establish therapeutic relationship   Manage environmental safety   Consider consult for impaired/altered neurological status   Promote functional status   Reduce impaired mental status risk   Observe patient activities   Orient patient to reality   Provide positive coping methods education   Provide impaired mental status condition education to family/caregiver   Provide functional status promotion education   Provide safety precautions education   Provide stroke risk factors, warning signs and symptoms education  Goal: Cognitive status restored to baseline  Outcome: Goal Ongoing  Flowsheets (Taken 10/22/2023 1211)  Cognitive status restored to baseline:   Assess patient history   Complete delirium assessment scale   Reduce delirium risk   Orient patient to reality   Promote rest   Consider consult for impaired cognitive status   Observe patient acitvities   Provide delirium signs and symptoms education     Problem: High Fall Risk  Goal: High Fall Risk  Outcome: Goal Ongoing  Flowsheets (Taken 10/22/2023 1211)  High Fall Risk:   All patients will receive: High fall risk sign, yellow wristband, yellow socks, gait belt, and shower shoes   Engage bed alarm - middle setting   Engage chair alarm   Stay with the patient while toileting/showering   PT/OT consult for fall prevention assessment if scoring in Unsteady Gait or Visual or Auditory impairment   Remove excess equipment/supplies   Educate patient to use call-light if tethered   Use shower shoes   Maximize bed functionality (optimize bed height, firm/flat surface)   Use safe patient handling equipment as appropriate   Assess need for bedside commode with drop arm to be available in room   Assess need for: quick release belt, PSM (video monitoring), assist x2 using clinical staff/equipment

## 2023-10-22 NOTE — Procedures
 VAT consulted for blood culture upon arrival, patient off unit for procedural xray.  Primary nurse to replace consult order upon patient return to unit.

## 2023-10-22 NOTE — Procedures
 Vascular Access Team consulted to obtain lab specimen.    Ultrasound Used: No    How Many Attempts: 1    Location of Unsuccessful Attempts: na    At 2155 approximately 20 ml of blood obtained from RAC by Erums I, RN. Patient tolerated the procedure well. Specimen labeled at bedside by Victoria Grass, VAT and sent to lab.

## 2023-10-22 NOTE — Progress Notes
 PHYSICAL THERAPY  PROGRESS NOTE          Name: Kevin Obrien   MRN: [id number]     DOB: 1950-07-06      Age: 73 y.o.  Admission Date: 10/17/2023     LOS: 5 days     Date of Service: 10/22/2023        Mobility  Mobility Level [employer] Highest Level of Mobility (JH-HLM): Move to Chair/Commode  Distance Walked (feet): 0 ft  Level of Assistance: Assist X1  Assistive Device: Hand Held  Activity Limited By: Fatigue;Weakness    Subjective  Significant Hospital Events: 73 y.o. male with hypertension, diabetes and prior CAD s/p PCI (~10 years ago) presented with acute right facial droop, left gaze and dysarthria.  Mental / Cognitive: Lethargic;Cooperative;Follows commands  Pain: Demonstrates no signs of pain  Persons Present: Daughter;Occupational Therapist    Home Living Situation  Lives With: Family  Type of Home: House  Entry Stairs: 2  In-Home Stairs: Able to live on main level  Bathroom Setup: Tub/shower unit  Patient Owned Equipment: Cane: Single point  Comments: Pt denies prior use of DME.    Prior Level of Function  Level Of Independence: Independent with ADL and household mobility with device    Bed Mobility/Transfer  Bed Mobility: Supine to Sit: Moderate Assist;Assist with Trunk;Assist with B LE;Head of Bed Elevated;No Rail  Bed Mobility: Sit to Supine: Maximum Assist;x2 People;Assist with B LE;Assist with Trunk  Transfer Type: Sit to/from Stand  Transfer: Assistance Level: To/From;Bed Side Chair;Moderate Assist  Transfer: Assistive Device:  (counter)  Transfers: Type Of Assistance: Verbal Cues;For Balance;For Strength Deficit;For Safety Considerations;Requires Extra Time  Other Transfer Type: Stand Pivot (unable to acheive full stand)  Other Transfer: Assistance Level: To/From;Bed;Bed Side Chair;Maximal Assist  Other Transfer: Assistive Device: Hand Hold Assist  Other Transfer: Type Of Assistance: Verbal Cues;For Balance;For Strength Deficit;For Safety Considerations;Requires Extra Time  End Of Activity Status: In Bed;Nursing Notified;Instructed Patient to Request Assist with Mobility;Instructed Patient to Use Call Light (bed alarm on)    Balance  Sitting Balance: Static Sitting Balance;1 UE Support;Moderate Assist  Standing Balance: Static Standing Balance;2 UE support;Maximal Assist    Education  Persons Educated: Patient/Family  Patient Barriers To Learning: Decreased Alertness;Confusion  Interventions: Repetition of Instructions  Teaching Methods: Verbal Instruction;Demonstration  Patient Response: More Instruction Required  Topics: Plan/Goals of PT Interventions;Importance of Increasing Activity;Recommend Continued Therapy    Assessment/Progress  Impaired Mobility Due To: Impaired Balance;Decreased Activity Tolerance;Decreased Strength  Impaired Strength Due To: Medical Status Limitation;Decreased Activity Tolerance  Assessment/Progress: Should Improve w/ Continued PT    AM-PAC 6 Clicks Basic Mobility Inpatient  Turning from your back to your side while in a flat bed without using bed rails: A lot  Moving from lying on your back to sitting on the side of a flat bed without using bedrails : Total  Moving to and from a bed to a chair (including a wheelchair): Total  Standing up from a chair using your arms (e.g. wheelchair, or bedside chair): Total  To walk in hospital room: Total  Climbing 3-5 steps with a railing: Total  Basic Mobility Inpatient Raw Score: 7  Standardized (T-scale) Score: 19.39  Mobility Goal [facility]: JH-HLM 2 Bed Activities/Dependent Transfers    Goals  Goal Formulation: With Patient/Family  Time For Goal Achievement: 5 days  Patient Will Go Supine To/From Sit: w/ Stand By Assist  Patient Will Transfer Sit to Stand: w/ Stand By Assist  Patient  Will Ambulate: 51-100 Feet, w/ Cane, w/ Stand By Assist  Patient Will Go Up / Down Stairs: 1-2 Stairs, w/ Stand By Assist    Plan  Treatment Interventions: Mobility training;Balance activities  Plan Frequency: 5-7 Days per Week  PT Plan for Next Visit: Work on sitting and standing balance;transfers, gait with Ax2 HHA or at rail    PT Discharge Recommendations  Recommendation: Inpatient setting    Therapist: Jolane Nations, PT  Date: 10/22/2023

## 2023-10-23 ENCOUNTER — Encounter: Admit: 2023-10-23 | Discharge: 2023-10-23 | Payer: MEDICARE

## 2023-10-23 ENCOUNTER — Inpatient Hospital Stay: Admit: 2023-10-23 | Discharge: 2023-10-22 | Payer: MEDICARE

## 2023-10-23 ENCOUNTER — Inpatient Hospital Stay: Admit: 2023-10-23 | Discharge: 2023-10-23 | Payer: MEDICARE

## 2023-10-23 LAB — CBC AND DIFF
~~LOC~~ BKR ABSOLUTE BASO COUNT: 0 10*3/uL (ref 0.00–0.20)
~~LOC~~ BKR ABSOLUTE EOS COUNT: 0 10*3/uL (ref 0.00–0.45)
~~LOC~~ BKR ABSOLUTE LYMPH COUNT: 1.5 10*3/uL (ref 1.00–4.80)
~~LOC~~ BKR ABSOLUTE MONO COUNT: 0.7 10*3/uL (ref 0.00–0.80)
~~LOC~~ BKR ABSOLUTE NEUTROPHIL: 10 10*3/uL — ABNORMAL HIGH (ref 1.80–7.00)
~~LOC~~ BKR BASOPHILS %: 0.3 % (ref 0.0–2.0)
~~LOC~~ BKR EOSINOPHILS %: 0.2 % (ref 0.0–5.0)
~~LOC~~ BKR LYMPHOCYTES %: 11 % — ABNORMAL LOW (ref 24.0–44.0)
~~LOC~~ BKR MCV: 88 fL — ABNORMAL HIGH (ref 80.0–100.0)
~~LOC~~ BKR MONOCYTES %: 5.6 % (ref 4.0–12.0)
~~LOC~~ BKR RBC COUNT: 4.7 10*6/uL — ABNORMAL LOW (ref 4.40–5.50)
~~LOC~~ BKR WBC COUNT: 13 10*3/uL — ABNORMAL HIGH (ref 4.50–11.00)

## 2023-10-23 LAB — URINALYSIS DIPSTICK
~~LOC~~ BKR LEUKOCYTES: NEGATIVE
~~LOC~~ BKR NITRITE: NEGATIVE
~~LOC~~ BKR URINE ASCORBIC ACID, UA: NEGATIVE
~~LOC~~ BKR URINE BILE: NEGATIVE
~~LOC~~ BKR URINE BLOOD: NEGATIVE
~~LOC~~ BKR URINE PH: 6 /HPF (ref 5.0–8.0)
~~LOC~~ BKR URINE SPEC GRAVITY: 1 /HPF (ref 1.005–1.030)

## 2023-10-23 LAB — POC GLUCOSE
~~LOC~~ BKR POC GLUCOSE: 134 mg/dL — ABNORMAL HIGH (ref 70–100)
~~LOC~~ BKR POC GLUCOSE: 148 mg/dL — ABNORMAL HIGH (ref 70–100)
~~LOC~~ BKR POC GLUCOSE: 189 mg/dL — ABNORMAL HIGH (ref 70–100)
~~LOC~~ BKR POC GLUCOSE: 209 mg/dL — ABNORMAL HIGH (ref 70–100)
~~LOC~~ BKR POC GLUCOSE: 211 mg/dL — ABNORMAL HIGH (ref 70–100)
~~LOC~~ BKR POC GLUCOSE: 227 mg/dL — ABNORMAL HIGH (ref 70–100)

## 2023-10-23 LAB — CULTURE - RESPIRATORY

## 2023-10-23 LAB — MRSA BY PCR (NASAL)

## 2023-10-23 LAB — COMPREHENSIVE METABOLIC PANEL
~~LOC~~ BKR ALBUMIN: 4 g/dL — ABNORMAL HIGH (ref 3.5–5.0)
~~LOC~~ BKR BLD UREA NITROGEN: 24 mg/dL (ref 7–25)
~~LOC~~ BKR CALCIUM: 9.8 mg/dL (ref 8.5–10.6)
~~LOC~~ BKR CHLORIDE: 99 mmol/L (ref 98–110)
~~LOC~~ BKR CREATININE: 0.9 mg/dL (ref 0.40–1.24)
~~LOC~~ BKR POTASSIUM: 3.9 mmol/L (ref 3.5–5.1)
~~LOC~~ BKR TOTAL BILIRUBIN: 0.8 mg/dL (ref 0.2–1.3)

## 2023-10-23 LAB — BASIC METABOLIC PANEL
~~LOC~~ BKR CHLORIDE: 102 mmol/L — ABNORMAL LOW (ref 98–110)
~~LOC~~ BKR POTASSIUM: 3.6 mmol/L — ABNORMAL LOW (ref 3.5–5.1)
~~LOC~~ BKR SODIUM, SERUM: 137 mmol/L — ABNORMAL LOW (ref 137–147)

## 2023-10-23 LAB — CBC
~~LOC~~ BKR RBC COUNT: 4.3 10*6/uL — ABNORMAL LOW (ref 4.40–5.50)
~~LOC~~ BKR WBC COUNT: 14 10*3/uL — ABNORMAL HIGH (ref 4.50–11.00)

## 2023-10-23 LAB — LACTIC ACID (BG - RAPID LACTATE)
~~LOC~~ BKR LACTIC ACID(SYRINGE): 2.8 mmol/L — ABNORMAL HIGH (ref 0.5–2.0)
~~LOC~~ BKR LACTIC ACID(SYRINGE): 2.9 mmol/L — ABNORMAL HIGH (ref 0.5–2.0)

## 2023-10-23 LAB — LACTIC ACID(LACTATE): ~~LOC~~ BKR LACTIC ACID: 1.5 mmol/L (ref 0.5–2.0)

## 2023-10-23 MED ORDER — INSULIN REGULAR HUMAN 100 UNIT/ML (3 ML) SC INPN
23 [IU] | SUBCUTANEOUS | 0 refills | Status: DC
Start: 2023-10-23 — End: 2023-10-26
  Administered 2023-10-23: 19:00:00 23 [IU] via SUBCUTANEOUS

## 2023-10-23 MED ORDER — INSULIN GLARGINE 100 UNIT/ML (3 ML) SC INJ PEN
18 [IU] | Freq: Every day | SUBCUTANEOUS | 0 refills | Status: DC
Start: 2023-10-23 — End: 2023-10-28
  Administered 2023-10-23: 19:00:00 18 [IU] via SUBCUTANEOUS

## 2023-10-23 MED ORDER — METOPROLOL TARTRATE 25 MG PO TAB
25 mg | Freq: Two times a day (BID) | GASTROSTOMY | 0 refills | Status: DC
Start: 2023-10-23 — End: 2023-10-24

## 2023-10-23 MED ORDER — LACTATED RINGERS IV SOLP
1000 mL | Freq: Once | INTRAVENOUS | 0 refills | Status: CP
Start: 2023-10-23 — End: ?
  Administered 2023-10-23: 22:00:00 1000 mL via INTRAVENOUS

## 2023-10-23 MED ORDER — LACTATED RINGERS IV BOLUS
1000 mL | Freq: Once | INTRAVENOUS | 0 refills | Status: CP
Start: 2023-10-23 — End: ?
  Administered 2023-10-23: 07:00:00 1000 mL via INTRAVENOUS

## 2023-10-23 MED ORDER — POTASSIUM CHLORIDE 20 MEQ PO TBTQ
40 meq | Freq: Once | ORAL | 0 refills | Status: CP
Start: 2023-10-23 — End: ?
  Administered 2023-10-23: 14:00:00 40 meq via ORAL

## 2023-10-23 MED ORDER — INSULIN GLARGINE 100 UNIT/ML (3 ML) SC INJ PEN
20 [IU] | Freq: Every day | SUBCUTANEOUS | 0 refills | Status: DC
Start: 2023-10-23 — End: 2023-10-23

## 2023-10-23 NOTE — Progress Notes
 OCCUPATIONAL THERAPY  PROGRESS NOTE      Name: Kevin Obrien   MRN: [id number]     DOB: May 26, 1951      Age: 73 y.o.  Admission Date: 10/17/2023     LOS: 6 days     Date of Service: 10/23/2023      Mobility  Patient Turn/Position: Supine (Simultaneous filing. User may not have seen previous data.)  Mobility Level [facility] Highest Level of Mobility (JH-HLM): Sit edge on of bed  Distance Walked (feet): 0 ft  Level of Assistance: Assist X2  Assistive Device: Hand Held  Activity Limited By: Weakness;Fatigue    Subjective  Significant Hospital Events: 73 y.o. male with hypertension, diabetes and prior CAD s/p PCI (~10 years ago) presented with acute right facial droop, left gaze and dysarthria.  Mental / Cognitive: Lethargic;Cooperative;Follows commands  Pain: Demonstrates no signs of pain  Pain level: Before activity;During activity;After activity  Persons Present: Daughter;Occupational Therapist    Home Living Situation  Lives With: Family  Type of Home: House  Entry Stairs: 2  In-Home Stairs: Able to live on main level  Bathroom Setup: Tub/shower unit  Patient Owned Equipment: Cane: Single point  Comments: Pt denies prior use of DME.    Prior Level of Function  Level Of Independence: Independent with ADL and household mobility with device  Comments: intermittent use of SPC per daughter  History of Falls in Past 3 Months: Yes      Vision  Comment: L eye with difficulty tracking especially across midline    ADL's  LE Dressing Assist: Total Assist  LE Dressing Deficits: Don/Doff R Sock;Don/Doff L Sock    ADL Mobility  Bed Mobility: Supine to Sit: Maximum assist;x2 people  Bed Mobility: Sit to Supine: Maximum assist;x2 people  Bed Mobility Comments: Pt able to initiate movement of legs off of bed but unable to fully move and needing assist from therapist in front with getting legs off bed/feet onto floor and therapist from behind assisting with trunk.  End of Activity Status: In bed;Nursing notified;Instructed patient to use call light;Instructed patient to request assist with mobility  Sitting Balance: Moderate assist;Static sitting balance;Dynamic sitting balance;No UE support        Cognition  Expression: Expressive Aphasia  Social Interaction: Interacts in a Network engineer;Increased Time to Adjust  Attention: Awake/Alert    ROM  L UE ROM: Not WFL  ROM Comments: Pt daugther reporting Pt unable to move LUE today which was not present before today. Pt able to actively lift arm to about 60 degrees of shoulder flexion with elbow and no able to get elbow into full extension. Pt completing dynamic reaching activity seated EOB working on core strength and AROM of LUE. Pt completing approx x10 cross body reaches with BUE in various planes/heights with modification made when reahcing with LUE. Pt then gripping stuffed animal with BUE completing x10 reaches to therapist located in front. Pt requiring modA for sitting balance during dynamic reaching with occasional minA. Pt needing increased time to complete reaching activity.      Education  Persons Educated: Patient/Family  Barriers To Learning: Impaired Communication  Interventions: Repetition of Instructions;Physical Cueing  Teaching Methods: Verbal Instruction;Demonstration  Patient Response: Return Demonstration;More Instruction Required  Topics: Role of OT, Goals for Therapy;Energy Conservation  Goal Formulation: With Patient/Family  Comments: Pt educated on purpose of EOB activities    Assessment  Assessment: Decreased ADL Status;Decreased UE ROM;Decreased UE Strength;Decreased Endurance  Prognosis: Fair;w/Cont OT s/p Acute  Discharge  Goal Formulation: Pt/family    AM-PAC 6 Clicks Daily Activity Inpatient  Putting on and taking off regular lower body clothes: A Lot  Bathing (Including washing, rinsing, drying): A Lot  Toileting, which includes using toilet, bedpan, or urinal: A Lot  Putting on and taking off regular upper body clothing: A Lot  Taking care of personal grooming such as brushing teeth: A Lot  Eating meals: Total  Daily Activity Raw Score: 11  Standardized (T-scale) Score: 29.04    Plan  OT Frequency: 5x/week  OT Plan for Next Visit: outcome measure, toileting to bedside commode, progress mobility        ADL Goals  Patient Will Perform All ADL's: w/ Stand By Assist    Functional Transfer Goals  Pt Will Perform All Functional Transfers: w/ Stand By Assist        OT Discharge Recommendations  Recommendation: Inpatient setting;Recommend rehab medicine consult  Patient Currently Requires Physical Assist With: All mobility;All personal care ADLs;All home functioning ADLs          Therapist: Dejae Bernet  Date: 10/23/2023

## 2023-10-23 NOTE — Progress Notes
 Endocrinology Note   Name:  Siddh Vandeventer MRN:  [id number] Admission Date:  10/17/2023    Principal Problem:    Ischemic stroke (CMS-HCC)  Active Problems:    Diabetes mellitus (CMS-HCC)    Neuropathy    Peripheral artery disease    Essential tremor    Poorly controlled diabetes mellitus (CMS-HCC)    Gaze preference    Moderate malnutrition      Reason for Consult:     10/19/23 1048  CONSULT ENDOCRINOLOGY PHYSICIAN  ONCE        Priority: Routine   Ordering Provider: Vannie Gentile, APRN-NP      Provider: (Not yet assigned)   Question Answer Comment   Reason for consult high A1C, no management    Is this consult for diabetes management? Yes    Consult Type: Co-Management w/Signed Orders    Ordering Provider's or Responsible Teams's Phone/Pager number? Number not on file              Assessment / Plan     Admission date and time: 10/17/2023  8:24 AM     Type 2 Diabetes Mellitus uncontrolled  Insulin dose changed  Non adherance to meds  Acute stroke   Dyslipidemia   Tube feed hyperglycemia   A1c 11.7, 10/17/2023   PTA regimen: non compliant, was supposed to be on meal time insulin. Was taking very sporadically   Was not taking any PO meds either   Hypoglycemic episodes on this regimen: none   Follows up with ? PCP for diabetes management  Diabetic-complications assessment:  CAD, stroke, neuropathy     Aspiration pneumonia    Current diet: NPO, Jevity 1.5 TF at goal 45 ml/hr  Glucocorticoids: none     Glucose values over the past 24 hours:    Estimated Creatinine Clearance: 80.4 mL/min (based on SCr of 0.76 mg/dL).    Recent Labs     10/21/23  2138 10/22/23  0003 10/22/23  0554 10/22/23  1157 10/22/23  1625 10/23/23  0039 10/23/23  0523 10/23/23  0901   GLUPOC 179* 191* 252* 258* 283* 227* 189* 209*       Diabetes Regimen Changes today based on glucose values:    Continue insulin glargine 18u every day   Increase regular insulin to 23u q6h. Hold if tube feeds are held  Continue correction factor q6h    Goal BG 140-180.     Diabetes Plans Post Discharge  Diabetes regimen: TBD  He will most likely need insulin on discharge.   We will request for outpatient follow-up. Family would like to come to [facility]. He used to see [facility] and last visit was in 2020    Patient discussed with Dr. Zafar.     Neomi Banks, MD  PGY-3, Internal Medicine       Subjective:      Hazel Wrinkle Cothern is a 73 y.o. male with history of hypertension, diabetes, coronary artery disease status post PCI presented with an acute stroke.  History of nonadherence to medications.   Endocrinology is consulted for diabetes management.     The patient is initially awake on my arrival. I discussed his blood glucose management with daughter Nicholes Barks, at bedside.     Past Medical History:    Accidental fall    Angina pectoris    Arthritis    BPH (benign prostatic hyperplasia)    Cataract    Constipation    Coronary artery disease  DM (diabetes mellitus) (CMS-HCC)    ED (erectile dysfunction)    Heart attack (CMS-HCC)    Hyperlipidemia    Hypertension    Joint pain    Neuropathy    Shingles     Surgical History:   Procedure Laterality Date    CORONARY STENT PLACEMENT  2016    HX CHOLECYSTECTOMY  09/2015    COLONOSCOPY N/A 04/20/2016    Performed by Karalee Oscar, MD at [facility]    ESOPHAGOGASTRODUODENOSCOPY N/A 04/20/2016    Performed by Karalee Oscar, MD at [facility]    ESOPHAGOGASTRODUODENOSCOPY BIOPSY  04/20/2016    Performed by Karalee Oscar, MD at [facility]    UREA BREATH TEST N/A 11/13/2016    Performed by Dearl Fabry, MD at [facility]     Family history reviewed; non-contributory  Social History     Tobacco Use    Smoking status: Former     Current packs/day: 0.00     Average packs/day: 2.0 packs/day for 10.0 years (20.0 ttl pk-yrs)     Types: Cigarettes     Start date: 04/11/1961     Quit date: 04/12/1971     Years since quitting: 52.5    Smokeless tobacco: Never    Tobacco comments:     Former smoker/quit years ago   Vaping Use    Vaping status: Never Used   Substance and Sexual Activity    Alcohol use: Not Currently    Drug use: Never    Sexual activity: Not Currently     Partners: Female     Birth control/protection: None          Review of Systems:   Negative except per HPI     Physical Exam:     General:  Alert, cooperative, no distress  NG feeds running at goal  Head:  Normocephalic, Atraumatic  Ext: warm, no pedal edema or rash on exposed lower extremities  Lungs:  symmetric chest rise, no tachypnea; without rales  Skin:   No rashes    Vital Signs: Last Filed In 24 Hours Vital Signs: 24 Hour Range   BP: 141/63 (05/14 0937)  Temp: 37.4 ?C (99.3 ?F) (05/14 1610)  Pulse: 82 (05/14 0937)  Respirations: 32 PER MINUTE (05/14 0937)  SpO2: 95 % (05/14 0937)  O2 Device: Nasal cannula (05/14 0811)  O2 Liter Flow: 4 Lpm (05/14 0811) BP: (135-157)/(55-63)   Temp:  [36.7 ?C (98.1 ?F)-37.8 ?C (100 ?F)]   Pulse:  [82-126]   Respirations:  [18 PER MINUTE-32 PER MINUTE]   SpO2:  [87 %-95 %]   O2 Device: Nasal cannula  O2 Liter Flow: 4 Lpm            Medications:  No current facility-administered medications on file prior to encounter.     No current outpatient medications on file prior to encounter.           Lab Results   Component Value Date    URMALBCRRAT 285.85 (H) 05/06/2017    TSH 0.40 06/05/2023       Chemistry and Blood Count Laboratory Flow sheet  Comprehensive Metabolic Profile        Latest Ref Rng & Units 10/20/2023     4:50 AM 10/21/2023     4:32 AM 10/22/2023     5:01 AM 10/22/2023     7:01 PM 10/23/2023     5:55 AM   CMP   Sodium 137 - 147 mmol/L 138  137  136  135  137    Potassium 3.5 - 5.1 mmol/L 3.9  3.8  3.8  3.9  3.6    Chloride 98 - 110 mmol/L 101  101  101  99  102    CO2 21 - 30 mmol/L 24  25  25  23  23     Anion Gap 3 - 12 13  11  10  13  12     Blood Urea Nitrogen 7 - 25 mg/dL 16  14  17  24  18     Creatinine 0.40 - 1.24 mg/dL 0.98  1.19  1.47  8.29  0.76    Glucose 70 - 100 mg/dL 562  130  865  784  696    Calcium 8.5 - 10.6 mg/dL 9.3  9.4  8.9  9.8 9.2    Total Protein 6.0 - 8.0 g/dL    7.3     Albumin 3.5 - 5.0 g/dL    4.0     Alk Phosphatase 25 - 110 U/L    71     ALT (SGPT) 7 - 56 U/L    21     AST 7 - 40 U/L    19     Total Bilirubin 0.2 - 1.3 mg/dL    0.8     GFR >29 mL/min >60  >60  >60  >60  >60         Complete Blood Count with Differential       Latest Ref Rng & Units 10/20/2023     4:50 AM 10/21/2023     4:32 AM 10/22/2023     5:01 AM 10/22/2023     7:01 PM 10/23/2023     5:55 AM   CBC with Diff   WBC 4.50 - 11.00 10*3/uL 11.50  11.90  11.70  13.20  14.90    RBC 4.40 - 5.50 10*6/uL 5.06  5.21  4.67  4.76  4.30    Hemoglobin 13.5 - 16.5 g/dL 52.8  41.3  24.4  01.0  12.8    Hematocrit 40.0 - 50.0 % 45.6  46.3  40.9  42.1  37.7    MCV 80.0 - 100.0 fL 90.1  88.9  87.6  88.4  87.6    MCH 26.0 - 34.0 pg 29.8  29.8  30.1  30.9  29.7    MCHC 32.0 - 36.0 g/dL 27.2  53.6  64.4  03.4  33.9    RDW 11.0 - 15.0 % 13.2  12.9  12.7  12.9  12.6    Platelet Count 150 - 400 10*3/uL 213  218  207  223  192    MPV 7.0 - 11.0 fL 9.2  9.0  9.6  9.6  9.8    Neurtrophils 41.0 - 77.0 %    82.5     Absolute Neutrophils 1.80 - 7.00 10*3/uL    10.90     Absolute Lymph Count 1.00 - 4.80 10*3/uL    1.50     Absolute Monocyte Count 0.00 - 0.80 10*3/uL    0.70     Eosinophils 0.0 - 5.0 %    0.2     Absolute Eosinophil Count 0.00 - 0.45 10*3/uL    0.00     Basophils 0.0 - 2.0 %    0.3          Most recent Lipid Profile Lipid Profile trend   Lab Results   Component Value Date    CHOL 216 (H) 10/17/2023  TRIG 219 (H) 10/17/2023    HDL 36 (L) 10/17/2023    LDL 161 (H) 10/17/2023    VLDL 43.8 10/17/2023    NONHDLCHOL 180 10/17/2023    Lab Results   Component Value Date    CHOL 216 (H) 10/17/2023    CHOL 184 06/05/2023    CHOL 159 09/01/2015    TRIG 219 (H) 10/17/2023    TRIG 207 (H) 06/05/2023    TRIG 299 (H) 09/01/2015    HDL 36 (L) 10/17/2023    HDL 27 (L) 06/05/2023    HDL 27 (L) 09/01/2015    LDL 161 (H) 10/17/2023    LDL 119 (H) 06/05/2023    LDL 84 09/01/2015    VLDL 38.7 10/17/2023 VLDL 41.4 06/05/2023    VLDL 60 09/01/2015    NONHDLCHOL 180 10/17/2023    NONHDLCHOL 157 06/05/2023    NONHDLCHOL 132 09/01/2015          Lab Results   Component Value Date    CHOL 216 (H) 10/17/2023    TRIG 219 (H) 10/17/2023    HDL 36 (L) 10/17/2023    LDL 161 (H) 10/17/2023    VLDL 43.8 10/17/2023    NONHDLCHOL 180 10/17/2023          Lab Results   Component Value Date    A1C 12.2 05/01/2018    A1C 12.2 05/06/2017    A1C 11.9 11/12/2016    HGBA1C 11.7 (H) 10/17/2023    HGBA1C 11.9 (H) 06/05/2023    HGBA1C 13.6 (H) 12/04/2017         BMI Readings from Last 8 Encounters:   10/17/23 24.46 kg/m?   08/30/23 22.38 kg/m?   06/06/23 22.96 kg/m?   03/06/23 21.79 kg/m?   01/29/22 23.34 kg/m?   01/08/22 23.34 kg/m?   08/02/21 23.42 kg/m?   11/13/20 21.63 kg/m?       Wt Readings from Last 8 Encounters:   10/17/23 66.7 kg (147 lb)   08/30/23 61 kg (134 lb 7.7 oz)   06/06/23 62.6 kg (138 lb)   03/06/23 61.2 kg (135 lb)   01/29/22 67.6 kg (149 lb)   01/08/22 67.6 kg (149 lb)   08/02/21 67.8 kg (149 lb 8 oz)   11/13/20 59 kg (130 lb)

## 2023-10-23 NOTE — Case Management (ED)
 Case Management Progress Note    NAME:Kevin Obrien                          MRN: [id number]              DOB:Jan 28, 1951          AGE: 73 y.o.  ADMISSION DATE: 10/17/2023             DAYS ADMITTED: LOS: 6 days      Today's Date: 10/23/2023    PLAN: Anticipate dc to [facility], pending medical stability and insurance auth.    Expected Discharge Date: 10/25/2023   Is Patient Medically Stable: No, Please explain: corpak  Are there Barriers to Discharge? no    INTERVENTION/DISPOSITION:  Discharge Planning                 SW reviewed EMR and met with NCM and Stroke team for huddle to discuss plan of care. Pt aspirated so seeing about treatment.    SW updated [facility] that nutrition plan is pending. [employer] [employer] is pending.    SW will continue to remain available and assist with discharge needs as they arise.    Transportation              Does the Patient Need Case Management to Arrange Discharge Transport? (ex: facility, ambulance, wheelchair/stretcher, Medicaid, cab, other): No  Will the Patient Use Family Transport?: Yes  Transportation Name, Phone and Availability #1: pt's daughters  Support                 Info or Referral                 Positive SDOH Domains and Potential Barriers                   Medication Needs                                                                                                                                                         Financial                 Legal                 Other                 Discharge Disposition  Dorisann Garre    Dorisann Garre, [place]  Inpatient Social Work Case Manager  Neurology General and Stroke  Available via [employer]  Cell: [phone]

## 2023-10-23 NOTE — Progress Notes
 Vascular Neurology Progress Note      Kevin Obrien  Admission Date: 10/17/2023  LOS: 6 days     Assessment/Plan:  Principal Problem:    Ischemic stroke (CMS-HCC)  Active Problems:    Diabetes mellitus (CMS-HCC)    Neuropathy    Peripheral artery disease    Essential tremor    Poorly controlled diabetes mellitus (CMS-HCC)    Gaze preference    Moderate malnutrition      Kevin Obrien is a 73 y.o. male with hypertension, diabetes and prior CAD s/p PCI (~10 years ago) presented with acute right facial droop, left gaze and dysarthria and was stroke activated. Initial NIH was 6, and the LKW was the night before. His BP at arrival was 210/85. CT head was negative for acute changes, CTA H&N showed no LVO but significant calcified athero and diffuse intracranial vascular stenoses.  He did not receive TNK due to LKW time and no EVT due to no LVO.  He was loaded with Aspirin and Plavix  with plan for DAPT for 3 months. A1c was 11.7%, endocrine consulted.  ECHO showed a PFO with shunt, but ultrasound of the lower extremities was without DVT. MRI brain showed a right sided pontomedullary brainstem infarct.  In light of the results of the MRI, all his clinical symptoms (dysphagia, facial weakness, gaze deviation) can be explained with the stroke location. He still has corpak, will work with SLP for swallow recs.  Pending d/c to [facility] when medically stable.      Neuro exam 10/23/23  Drowsy but able to respond to commands, new findings on 5/14 0 unable to gaze to the left (and also right), more pronounced LUE weakness,   but at least 3/5, 5/5 in RUE and R LE and L LE      Acute R pontomedullary infarct  Hx of CAD s/p PCI  Diffuse intracranial athero  Right sided facial weakness (upper and lower)  Forced left gaze deviation  Dysphagia  LUE Weakness    NeuroImaging  CT Head  1.  No acute intracranial hemorrhage or mass effect.   2.  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   3.  Additional cerebral hypodensities, likely secondary to chronic microvascular ischemic changes.   CTA head  1.  Multifocal stenosis of the anterior, middle, and posterior cerebral arteries most likely secondary to significant intracranial atherosclerosis. No large vessel central occlusion is identified   2.  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   CTA neck  1.  Bilateral atherosclerotic plaque at the carotid bulbs and origins of the ICAs without significant/high-grade stenosis of the ICAs by NASCET criteria.   2. 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   3. 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   MRI Brain  1.  Small acute right pontomedullary brainstem infarct   2.  Stable old left basal ganglia infarct and old left thalamic and right supratentorial white matter lacunar infarcts.   3.  Stable generalized cerebral volume loss and moderate patchy supratentorial white matter FLAIR hyperintensities, likely due to chronic microvascular ischemia.   4.  Intracranial right vertebral stenotic-occlusive disease, better assessed on CTA.     Stroke work up  A1c:  11.7  Fasting Lipid  Profile: TG 219, Chol 216, LDL 161  TTE: EF of  65%, bubble study shows a Right to Left Shunting consistent with a PFO, no thrombus/valvular vegetation, LA index WNL  Hypercoagulable work up: not indicated    Stroke Etiology - large artery athero  On 5/14, patient noted to have a new left gaze palsy in addition to the original R gaze palsy, and more pronounced LUE weakness - given the location of stroke on MRI, it is likely related to evolution of the stroke, and stuttering symptoms as it is lacunar stroke. Will get CT head to monitor.      PLAN   > P2Y12 138 - hence he is a Plavix responder  > Video swallow planned for today 5/14, cancelled in setting of suspected aspiration pneumonia  > Recommended PEG tube placement to family, they will discuss and notify team when they have a decision  > Continue DAPT with aspirin 81 mg daily and clopidogrel 75 mg daily for 90 days followed by aspirin 81 mg indefinitely for secondary stroke prevention  > PT OT recommending rehab  > Continue Crestor 40 mg daily per cardiology recs  > Continue Zosyn 4.5 g IV Q6 for suspected aspiration pneumonia  > Holding off of additional BP management in setting of aspiration pneumonia/sepsis  > Repeat CT head w/o contrast given decreased movement of left arm and limited EOM    Aspiration pneumonia  Acute Hypoxemic Respiratory Failure  SIRS (2/4)  Increasing oxygen requirements overnight on 5/13, tachycardic, leukocytosis , elevated procal  > Repeat CXR 5/13 showed mild basal opacities, favored to represent atelectasis  > Received 1 dose vancomycin 1,250 mg 5/13  > Continue Zosyn 4.5 g Q6 IV started 5/13 x5 days  > Sputum cultured      Hypertension  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 after evaluation on 10/21/2023  > Tube feeds via Corpak  > Lidocaine jelly, phenol throat spray for Corpak related discomfort  > CXR on 5/12 did not reveal any aspiration pneumonia  > Cancelled repeat video swallow study due to current treatment for aspiration pneumonia  > Discussed PEG placement with family          DVT Prophylaxis: Lovenox 40mg  qday   Anticoagulation: none  PT/OT recs: [facility]  Diet: N.p.o., tube feeds via Corpak  Dispo: Pending swallow evaluation  Code: Full      Patient seen and discussed with Dr. Alonso Jan who helped formulate the assessment and plan.    Franklyn Ivan, MS3    Gustavus Leisure, MBBS  Neurology, PGY4  Available on Voalte      _______________________________________________________________________    Subjective:  Family at bedside.  Patient minimally cooperative with exam this morning saying he is sleeping. Family shared concerns that he has been increasingly tired over the past day. They report he is talking less and has told them his nose and throat hurt from the  feeding tube. Daughter reports she was here overnight and he slept well most of the night.      Objective:                  Vital Signs:  Last Filed                   Vital Signs: 24 Hour Range   BP: 157/55 (05/14 0518)  Temp: 37.6 ?C (99.6 ?F) (05/14 0518)  Pulse: 101 (05/14 0518)  Respirations: 26 PER MINUTE (05/14 0518)  SpO2: 94 % (05/14 0518)  O2 Device: Nasal cannula (05/14 0518)  O2 Liter Flow: 4 Lpm (05/14 0518)  BP: (135-157)/(55-92)   Temp:  [36.7 ?C (98.1 ?F)-37.8 ?C (100 ?F)]   Pulse:  [94-126]   Respirations:  [16 PER MINUTE-26 PER MINUTE]   SpO2:  [87 %-95 %]   O2 Device: Nasal cannula  O2 Liter Flow: 4 Lpm    Intensity Pain Scale (Self Report): (not recorded)    Intake/Output Summary: (Last 24 hours)    Intake/Output Summary (Last 24 hours) at 10/23/2023 0734  Last data filed at 10/23/2023 0056  Gross per 24 hour   Intake 1879 ml   Output 930 ml   Net 949 ml         Medications:  Scheduled Meds:amLODIPine (NORVASC) tablet 10 mg, 10 mg, Per NG tube, QDAY  aspirin chewable tablet 81 mg, 81 mg, Per NG tube, QDAY  clopiDOGreL (PLAVIX) tablet 75 mg, 75 mg, Per NG tube, QDAY  enoxaparin (LOVENOX) syringe 40 mg, 40 mg, Subcutaneous, QDAY(21)  insulin aspart (U-100) (NOVOLOG FLEXPEN U-100 INSULIN) injection PEN 0-12 Units, 0-12 Units, Subcutaneous, Q6H  insulin glargine (LANTUS SOLOSTAR U-100 INSULIN) injection PEN 18 Units, 18 Units, Subcutaneous, QDAY  insulin regular human (NOVOLIN R FLEXPEN) injection PEN 20 Units, 20 Units, Subcutaneous, Q6H  lisinopriL (ZESTRIL) tablet 5 mg, 5 mg, Feeding Tube, QDAY  metoprolol tartrate tablet 12.5 mg, 12.5 mg, Feeding Tube, BID  piperacillin/tazobactam (ZOSYN) 4.5 g in sodium chloride 0.9% (NS) 100 mL IVPB (MB+), 4.5 g, Intravenous, Q6H*  Protein Supplement Packets, , SEE ADMIN INSTRUCTIONS, QDAY  rosuvastatin (CRESTOR) tablet 40 mg, 40 mg, Feeding Tube, QDAY  thiamine mononitrate (vit B1) tablet 100 mg, 100 mg, Per NG tube, QDAY    Continuous Infusions:   Diet Enteral Feeding Standard Infusion 45 mL/hr at 10/22/23 1018     PRN and Respiratory Meds:acetaminophen Q4H PRN **OR** acetaminophen Q4H PRN, dextrose 50% (D50) IV PRN, lidocaine hcl PRN, pancrelipase 20,880 Units/sodium bicarbonate 650 mg ([place] CLOG DESTR[employer]ER) PRN (On Call from Rx), phenoL PRN, vancomycin, pharmacy to manage Per Pharmacy       Neuro exam:   Mental status: Drowsy but opens eyes when spoken to  Speech: Dysarthria   Cranial Nerves:    Normal Abnormal   II Pupils reactive, visual fields normal    III, IV, VI  Patient does not move eyes to the left or right, vertical eye movements intact   V Sensation nml V1-V3    VII     VIII nml to conversation    IX, X +strong cough    XI Equal shoulder shrug    XII Tongue midline    Muscle/motor: 5/5 in RUE and RLE, 4/5 in LLE, 3/5 in LUE  Sensation: Normal in bilateral upper and lower extremities, no abnormalities   Coordination: Not assessed due to patient fatigue  Gait and Sation: Not assessed    Laboratory Review:  No  results found for: PHART, PCO2A, PO2ART, HCO3A, BASEEXA, BASEDEFA, O2SATACAL  Lab Results   Component Value Date    HGB 12.8 (L) 10/23/2023    HCT 37.7 (L) 10/23/2023    PLTCT 192 10/23/2023    WBC 14.90 (H) 10/23/2023    NEUT 82.5 (H) 10/22/2023    ANC 10.90 (H) 10/22/2023     Lab Results   Component Value Date    NA 135 (L) 10/22/2023    K 3.9 10/22/2023    CL 99 10/22/2023    CO2 23 10/22/2023    GAP 13 (H) 10/22/2023    BUN 24 10/22/2023    CR 0.90 10/22/2023    GLU 263 (H) 10/22/2023    CA 9.8 10/22/2023    MG 1.8 10/21/2023    LACTIC 1.5 10/23/2023    TOTPROT 7.3 10/22/2023    AST 19 10/22/2023    ALT 21 10/22/2023    ALKPHOS 71 10/22/2023     Lab Results   Component Value Date    TNI 0.00 02/22/2019     Lab Results   Component Value Date    PTT 27.6 10/17/2023    INR 1.0 (L) 10/17/2023    DIMER 254 02/22/2019     Lab Results   Component Value Date    TSH 0.40 06/05/2023     Lab Results   Component Value Date    CHOL 216 (H) 10/17/2023    TRIG 219 (H) 10/17/2023    HDL 36 (L) 10/17/2023    LDL 161 (H) 10/17/2023    VLDL 43.8 10/17/2023     No results found for: VANPK, VANTR  No results found for: CYCLOSPOR, TACROLIMUS, FREEPHENY     Point of Care Testing:  (Last 24 hours):  FSBS (Manual): (!) 291  Glucose: (!) 263  POC Glucose (Download): (!) 189    Radiology and Other Diagnostics Reviewed      [facility]   Vascular Neurology

## 2023-10-23 NOTE — Progress Notes
 SPEECH-LANGUAGE PATHOLOGY  NO TREATMENT NOTE     Department following for dysphagia and language tx. Pt originally w/ plans for VSS this date, however, primary team recommending cancelling VSS 2/2 pt now being treated for aspiration PNA. Will hold this date and f/u bedside as clinically appropriate.     Therapist: Raymondo Calin, SLP MA, [facility] Voalte# [phone]  Date: 10/23/2023

## 2023-10-23 NOTE — Progress Notes
 CARDIOLOGY PROGRESS NOTE      General Wearing  Admission Date:  10/17/2023                     Assessment & Recs   Kevin Obrien is a 73 y.o. 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 male who was admitted for concern for stroke on 10/17/2023. He has a past medical history of coronary artery disease (NSTEMI April 2015 with PCI to diagonal branch), diabetes type 2, hypertension, hyperlipidemia and peripheral vascular disease.    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  Occasionally he will miss doses of his medications and insulin.  He presented with acute right facial droop, left gaze and dysarthria, stroke team was activated.  CT of his head was negative for acute changes.  CTA of his head and neck did not show any large vessel occlusion but significant calcified atherosclerotic plaque and diffuse intracranial vascular stenosis.  He did not receive TNK, he was loaded with aspirin and Plavix with plan for DAPT for 3 months.  A1c was checked and was 11.7%, endocrine has been consulted.  Echo was obtained on admission demonstrating a PFO with shunt and LVOT obstruction.  MRI of his brain also showed right-sided pontine medullary brainstem infarct.    Cardiology consulted on 5/12 (with brief recommendations, seen on 5/13) due to LVOT obstruction seen on his echocardiogram. Echo done on 10/17/23 demonstrated LVOT gradient of 30 mmHg which increased to 41 mmHg with Valsalva and systolic anterior motion of mitral valve  (SAM) also present.  Normal septal wall motion, nonspecific mitral valve thickening, interventricular septum size only slightly elevated at 1.2 cm.  No contractility abnormality seen, these findings similar to echo that was completed in December 2024. No previous history of mitral valve replacement. Per Dr. Josph Nimrod recommendation on 5/12, he was given 1L of LR on 5/12. He is not currently on an inotrops or vasodilators.     PTA cardiac medications: Amlodipine 10 mg daily, atorvastatin 40 mg daily, carvedilol 6.25 mg twice daily, hydrochlorothiazide 25 mg daily, lisinopril 40 mg daily. He sees Dr. Roderick Civatte with [facility], he was last seen in clinic on 07/19/23.         Assessment:  LVOT obstruction   Systolic anterior motion of the mitral valve with dynamic LVOT obstruction, baseline gradient increased to 30 mmHg from 27 mmHg from December 2024 echo; small LV cavity  Interventricular septum size only slightly increased at 1.2, unchanged from echo December 2024  Received 1L of LR on 5/12; likely hypovolemia worsening the gradient   Started on Metoprolol tartrate 12.5mg  BID on 5/13  Acute right pontomedullary infarct  Details outlined in HPI   Will continue on DAPT with ASA and clopidogrel 75 mg for 3 months  Coronary artery disease with history of NSTEMI with PCI to diagonal branch in April 2015  Known residual disease   Had ischemic evaluation in 2020;  normal   Uncontrolled diabetes type 2   A1C 11% during this admit   Insulin dependent prior to admission; some medication non-compliance   Peripheral vascular disease  Hypertension  Hyperlipidemia  His most recent lipid panel included:  Lab Results   Component Value Date    LDL 161 (H) 10/17/2023    HDL 36 (L) 10/17/2023    CHOL 216 (H) 10/17/2023    TRIG 219 (H) 10/17/2023    AST 19 10/22/2023    ALT 21 10/22/2023   PTA on Atorvastatin 40mg  daily; LDL goal <70; currently not at goal     Peripheral vascular disease   Aortic valve sclerosis    No acute events overnight. Metoprolol started 5/13 tolerated well.     Recommendations:  LVOT obstruction with SAM present on echo on this admission and December 2024; no contractility abnormalities or decrease in EF to suggest Takotsubo cardiomyopathy, increase in interventricular septum; likely related to hypovolemia with worsening LVOT gradients; recommend 1.5-2L fluid while he is on tube feeds.    Recommend low dose beta blockade to reduce afterload; started Metoprolol tartrate 12.5mg  BID on 5/13 (cannot do succinate due to tube feeds). He has tolerated this well, can increase to Metoprolol tartrate to 25mg  BID (done).   Continue with aggressive risk factor modification for stroke risk reduction; switched Atorvastatin to Rosuvastatin 40mg  daily on 5/13; continue on current dose. Recommend to recheck lipid profile in 3 months.   Consider MCOT on time of discharge for evaluation of possible atrial fibrillation with recent stroke (order event monitor specify 30 day MCOT,can route results to Dr. Alline Ivans).   Continue to monitor on telemetry.   Per patient family discussion, he would like to follow-up with [place] cardiology. Recommend placing orders for echo in one week after discharge to reassess LVOT gradient.     Thank you for the opportunity to participate in the care of your patient.  Please call with questions or concerns.    Thank you for the Consult.  Will sign-off at this time.  Discussed with Cardiology attending.  Please see attestation by Dr. Alline Ivans for final recommendations or changes to above plan.       Jeppie Moles, DNP, APRN, FNP-C  Cardiology Consult Service   Available on Voalte and AMS    _____________________________________________________________________________  Cardiac testing and imaging       CXR: 10/21/23  No acute cardiopulmonary abnormality    ECG: 10/17/23: Normal sinus rhythm with right bundle branch block    Echocardiograms: 10/17/23  Normal LV systolic function with an estimated EF of 65%.  Basal septum appears to be mildly thickened. Systolic anterior motion of the mitral leaflets is noted on some views.  Baseline LVOT gradient is 30 mmHg, increasing slightly to 40 mmHg with Valsalva.  Normal RV size and function  Normal-sized atria  Trivial MR is present  Bubble study shows right-to-left shunting consistent with a PFO   Nondilated IVC    Telemetry monitor: sinus tachycardia     Stress tests: Regadenoson MPI 02/23/2019:  SUMMARY/OPINION:  This study is normal with no evidence of significant   myocardial ischemia. Left ventricular systolic function is normal. There   are no high risk prognostic indicators present.  The pharmacologic ECG   portion of the study is negative for ischemia.   There are no prior studies available for comparison.   In aggregate the current study is low risk in regards to predicted annual   cardiovascular mortality rate.  Home Medications  No medications prior to admission.       Current Medications  Scheduled Meds:amLODIPine (NORVASC) tablet 10 mg, 10 mg, Per NG tube, QDAY  aspirin chewable tablet 81 mg, 81 mg, Per NG tube, QDAY  clopiDOGreL (PLAVIX) tablet 75 mg, 75 mg, Per NG tube, QDAY  enoxaparin (LOVENOX) syringe 40 mg, 40 mg, Subcutaneous, QDAY(21)  insulin aspart (U-100) (NOVOLOG FLEXPEN U-100 INSULIN) injection PEN 0-12 Units, 0-12 Units, Subcutaneous, Q6H  insulin glargine (LANTUS SOLOSTAR U-100 INSULIN) injection PEN 18 Units, 18 Units, Subcutaneous, QDAY  insulin regular human (NOVOLIN R FLEXPEN) injection PEN 23 Units, 23 Units, Subcutaneous, Q6H  lisinopriL (ZESTRIL) tablet 5 mg, 5 mg, Feeding Tube, QDAY  metoprolol tartrate tablet 12.5 mg, 12.5 mg, Feeding Tube, BID  piperacillin/tazobactam (ZOSYN) 4.5 g in sodium chloride 0.9% (NS) 100 mL IVPB (MB+), 4.5 g, Intravenous, Q6H*  Protein Supplement Packets, , SEE ADMIN INSTRUCTIONS, QDAY  rosuvastatin (CRESTOR) tablet 40 mg, 40 mg, Feeding Tube, QDAY  thiamine mononitrate (vit B1) tablet 100 mg, 100 mg, Per NG tube, QDAY    Continuous Infusions:   Diet Enteral Feeding Standard Infusion 45 mL/hr at 10/22/23 1018     PRN and Respiratory Meds:acetaminophen Q4H PRN **OR** acetaminophen Q4H PRN, dextrose 50% (D50) IV PRN, lidocaine hcl PRN, pancrelipase 20,880 Units/sodium bicarbonate 650 mg ([place] CLOG DESTROYER) PRN (On Call from Rx), phenoL PRN       Allergies  Allergies   Allergen Reactions    Seasonal Allergies RHINITIS       Past Medical History  Past Medical History:    Accidental fall    Angina pectoris    Arthritis    BPH (benign prostatic hyperplasia)    Cataract    Constipation    Coronary artery disease    DM (diabetes mellitus) (CMS-HCC)    ED (erectile dysfunction)    Heart attack (CMS-HCC)    Hyperlipidemia    Hypertension    Joint pain    Neuropathy    Shingles       Past Surgical History  Surgical History:   Procedure Laterality Date    CORONARY STENT PLACEMENT  2016    HX CHOLECYSTECTOMY  09/2015    COLONOSCOPY N/A 04/20/2016    Performed by Karalee Oscar, MD at [facility] ENDO    ESOPHAGOGASTRODUODENOSCOPY N/A 04/20/2016    Performed by Karalee Oscar, MD at [facility] ENDO    ESOPHAGOGASTRODUODENOSCOPY BIOPSY  04/20/2016    Performed by Karalee Oscar, MD at [facility] END[facility]    UREA BREATH TEST N/A 11/13/2016    Performed by Dearl Fabry, MD at [facility] ENDO       Social History  Social History     Socioeconomic History    Marital status: Separated    Number of children: 3   Tobacco Use    Smoking status: Former     Current packs/day: 0.00     Average packs/day: 2.0 packs/day for 10.0 years (20.0 ttl pk-yrs)     Types: Cigarettes     Start date: 04/11/1961     Quit date: 04/12/1971     Years since quitting: 52.5    Smokeless tobacco: Never    Tobacco comments:     Former smoker/quit years ago   Vaping Use    Vaping status: Never Used   Substance and Sexual Activity    Alcohol use: Not Currently    Drug use: Never    Sexual activity: Not Currently     Partners: Female  Birth control/protection: None       Family History  Family History   Problem Relation Name Age of Onset Diabetes Mother AMPARO CUBIAS     Diabetes Father JOSE Garverick     Cancer-Lung Father JOSE Dilger     Melanoma Neg Hx      Cataract Neg Hx      Glaucoma Neg Hx      Macular Degen Neg Hx         Review of Systems    14 point ROS completed and negative except as noted per HPI    Physical Exam:  Vital Signs: Last Filed In 24 Hours Vital Signs: 24 Hour Range   BP: 141/63 (05/14 0937)  Temp: 37.4 ?C (99.3 ?F) (05/14 4540)  Pulse: 82 (05/14 0937)  Respirations: 32 PER MINUTE (05/14 0937)  SpO2: 95 % (05/14 0937)  O2 Device: Nasal cannula (05/14 0811)  O2 Liter Flow: 4 Lpm (05/14 0811) BP: (135-157)/(55-63)   Temp:  [36.7 ?C (98.1 ?F)-37.8 ?C (100 ?F)]   Pulse:  [82-126]   Respirations:  [18 PER MINUTE-32 PER MINUTE]   SpO2:  [87 %-95 %]   O2 Device: Nasal cannula  O2 Liter Flow: 4 Lpm       Intake/Output Summary (Last 24 hours) at 10/23/2023 1212  Last data filed at 10/23/2023 0857  Gross per 24 hour   Intake 1578 ml   Output 930 ml   Net 648 ml        Physical Exam    BP (!) 141/63 (BP Source: Arm, Left Upper)  - Pulse 82  - Temp 37.4 ?C (99.3 ?F)  - Ht 165.1 cm (5' 5)  - Wt 66.7 kg (147 lb)  - SpO2 95%  - BMI 24.46 kg/m?   GENERAL APPEARANCE: Patient in no apparent distress.  PSYCH:  unable to assess; aphasia present  NEURO:  difficult to assess due to aphasia   VESSELS: JVD is not elevated above the sternal notch.  ENT: Moist mucous membranes- Corkpak present   CARDIOVASCULAR:   No parasternal lift Regular rhythm and normal rate. S1 and S2 present.   Systolic murmur .  There is no pericardial rub  GI: Abdomen  nondistended No appreciable hepatomegaly in the upright position  RESPIRATORY:  rhonchi noted  EXTREMITIES: There was no    lower extremity edema   SKIN: No rash on chest  GAIT:  did not witness patient ambulate      Lab/Radiology/Other Diagnostic Tests:  CBC w/Diff    Lab Results   Component Value Date/Time    WBC 14.90 (H) 10/23/2023 05:55 AM    RBC 4.30 (L) 10/23/2023 05:55 AM    HGB 12.8 (L) 10/23/2023 05:55 AM    HCT 37.7 (L) 10/23/2023 05:55 AM    MCV 87.6 10/23/2023 05:55 AM    MCH 29.7 10/23/2023 05:55 AM    MCHC 33.9 10/23/2023 05:55 AM    RDW 12.6 10/23/2023 05:55 AM    PLTCT 192 10/23/2023 05:55 AM    MPV 9.8 10/23/2023 05:55 AM    Lab Results   Component Value Date/Time    NEUT 82.5 (H) 10/22/2023 07:01 PM    ANC 10.90 (H) 10/22/2023 07:01 PM    LYMA 11.4 (L) 10/22/2023 07:01 PM    ALC 1.50 10/22/2023 07:01 PM    MONA 5.6 10/22/2023 07:01 PM    AMC 0.70 10/22/2023 07:01 PM    EOSA 0.2 10/22/2023 07:01 PM    AEC 0.00 10/22/2023  07:01 PM    BASA 0.3 10/22/2023 07:01 PM    ABC 0.00 10/22/2023 07:01 PM       Comprehensive Metabolic Profile    Lab Results   Component Value Date/Time    NA 137 10/23/2023 05:55 AM    K 3.6 10/23/2023 05:55 AM    CL 102 10/23/2023 05:55 AM    CO2 23 10/23/2023 05:55 AM    GAP 12 10/23/2023 05:55 AM    BUN 18 10/23/2023 05:55 AM    CR 0.76 10/23/2023 05:55 AM    GLU 198 (H) 10/23/2023 05:55 AM    Lab Results   Component Value Date/Time    CA 9.2 10/23/2023 05:55 AM    ALBUMIN 4.0 10/22/2023 07:01 PM    TOTPROT 7.3 10/22/2023 07:01 PM    ALKPHOS 71 10/22/2023 07:01 PM    AST 19 10/22/2023 07:01 PM    ALT 21 10/22/2023 07:01 PM    TOTBILI 0.8 10/22/2023 07:01 PM    GFR >60 10/23/2023 05:55 AM    GFRAA >60 02/22/2019 06:28 PM       Thyroid Studies    Lab Results   Component Value Date/Time    TSH 0.40 06/05/2023 08:09 PM    Lab Results   Component Value Date/Time    TNI 0.00 02/22/2019 11:41 PM

## 2023-10-23 NOTE — Progress Notes
 CLINICAL NUTRITION                                                        Clinical Nutrition Follow-Up Assessment     Name: Kevin Obrien   MRN: [id number]     DOB: 1951-02-27      Age: 73 y.o.  Admission Date: 10/17/2023     LOS: 6 days     Date of Service: 10/23/2023        Recommendation:  REC continuous enteral nutrition of Jevity 1.5 with goal rate of 17mL/hr (total 1070mL/day) + 1pks of prosource protein.   At goal provides 1700kcal, 89g protein, and free H2O.   Additional free water flush per primary team, recommend at least 30mL q4h for tube patency. Maintenance fluid flushes if desired are q4h.   May be at risk for refeeding syndrome: continue 100mg  thiamine daily x 3-5 days. Monitor Mg, Phos, K+ closely and replace PRN with goal to keep K+ greater than 4, Phos greater than 3 & Mg greater than 2.     The nutrition-related order modifications are made in communication with the primary service, who remains responsible for the orders and overall care of the patient.    Comments:  Clinical nutrition following for EN management. EN continues to run at goal rate of 33mL/hr via corpak. Corpak replaced, tip now in gastric body. 3-day EN avg meeting ~80-90% of minimum kcal, protein needs. Labs/meds reviewed. Pt continues to be at acute nutrition risk, RD will continue to follow.                            Nutrition Assessment of Patient:  Admit Weight: 66.7 kg (no wt source)  BMI (Calculated): 24.46    Pertinent Allergies/Intolerances: NKFA         Current EN Order: Jevity 1.5 with goal rate of 56mL/hr + 1 prosource     Intake Comment: 3-day EN avg  Intake (calories) Daily Average : 1319 kilocalories (79% minimum kcal needs)  Intake (protein) Daily Average : 72 grams (90% minimum protein needs)  Weight Used for Calculation: 66.7 kg  Estimated Calorie Needs: 1668-2001kcal (25-30kcal/kg)  Estimated Protein Needs: 80-93g (1.2-1.4g/kg)    Malnutrition Assessment:  Malnutrition present on admission  ICD-10 code E44: Chronic illness/Moderate non-severe malnutrition      Mild loss of body fat, Mild loss of muscle mass            Malnutrition Interventions: EN recs to meet 100% of needs    Nutrition Focused Physical Exam:  Loss of Subcutaneous Fat: Yes; Severity: Mild; Location: Orbital, Triceps  Muscle Wasting: Yes; Severity: Moderate; Location: Clavicle, Temple, Deltoid  Physical Assessment:     Ascites: No  Pressure Injury: none documented     Comment: LBM 5/10    Nutrition Diagnosis:  Inadequate oral intake  Etiology: swallowing difficulty  Signs & Symptoms: NPO status, need for enteral nutrition                      Intervention / Plan:  EN recs to meet 100% of needs  Monitor EN rate/tolerance, weights, labs, meds, GI fxn           Theodis Fiscal, MS, RD, LD  Clinical Dietitian

## 2023-10-23 NOTE — Care Plan
 Problem: Discharge Planning  Goal: Participation in plan of care  Outcome: Goal Ongoing  Flowsheets (Taken 10/17/2023 1503 by Odetta Benes, RN)  Participation in Plan of Care: Involve patient/caregiver in care planning decision making  Goal: Knowledge regarding plan of care  Outcome: Goal Ongoing  Flowsheets (Taken 10/22/2023 1211 by Alison Irvine, RN)  Knowledge regarding plan of care:   Provide plan of care education   Provide fall prevention education   Provide infection prevention education   Provide medication management education   Provide VTE signs and symptoms education  Goal: Prepared for discharge  Outcome: Goal Ongoing     Problem: Neurological Status, Impaired/Altered  Goal: Progress toward maximizing functional outcomes  Outcome: Goal Ongoing  Flowsheets (Taken 10/22/2023 1211 by Alison Irvine, RN)  Progress toward maximizing functional outcomes:   Address etiologies   Establish therapeutic relationship   Manage environmental safety   Consider consult for impaired/altered neurological status   Promote functional status   Reduce impaired mental status risk   Observe patient activities   Orient patient to reality   Provide positive coping methods education   Provide impaired mental status condition education to family/caregiver   Provide functional status promotion education   Provide safety precautions education   Provide stroke risk factors, warning signs and symptoms education  Goal: Cognitive status restored to baseline  Outcome: Goal Ongoing  Flowsheets (Taken 10/23/2023 0308)  Cognitive status restored to baseline:   Assess patient history   Orient patient to reality   Observe patient acitvities   Complete delirium assessment scale   Consider consult for impaired cognitive status   Provide delirium signs and symptoms education   Reduce delirium risk   Promote rest     Problem: High Fall Risk  Goal: High Fall Risk  Outcome: Goal Ongoing  Flowsheets (Taken 10/22/2023 1211 by Alison Irvine, RN)  High Fall Risk:   All patients will receive: High fall risk sign, yellow wristband, yellow socks, gait belt, and shower shoes   Engage bed alarm - middle setting   Engage chair alarm   Stay with the patient while toileting/showering   PT/OT consult for fall prevention assessment if scoring in Unsteady Gait or Visual or Auditory impairment   Remove excess equipment/supplies   Educate patient to use call-light if tethered   Use shower shoes   Maximize bed functionality (optimize bed height, firm/flat surface)   Use safe patient handling equipment as appropriate   Assess need for bedside commode with drop arm to be available in room   Assess need for: quick release belt, PSM (video monitoring), assist x2 using clinical staff/equipment     Problem: Skin Integrity  Goal: Skin integrity intact  Outcome: Goal Ongoing  Flowsheets (Taken 10/17/2023 1503 by Odetta Benes, RN)  Skin integrity intact:   Assess nutrition   Provide skin care interventions   Promote nutrition   Assure position change   Provide incontinence management interventions   Monitor skin integrity   Reduce skin shear, friction and tissue load   Provide skin self-assessment education   Consider consult for skin integrity  Goal: Healing of skin (Wound & Incision)  Outcome: Goal Ongoing  Flowsheets (Taken 10/17/2023 1503 by Odetta Benes, RN)  Healing of wound (wounds and Incisions):   Assess for signs and symptoms of wound infection   Assess wound site healing   Implement wound/incision care as ordered   Provide wound/incision care as ordered  Goal: Healing of skin (Pressure Injury)  Outcome: Goal Ongoing  Problem: Mobility/Activity Intolerance  Goal: Maximize functional ADL's and mobility outcomes  Outcome: Goal Ongoing  Flowsheets (Taken 10/17/2023 1503 by Odetta Benes, RN)  Maximize functional ADLs and mobility outcomes:   Administer oxygen to maintain SpO2 at approprate levels   Maintain body position   Consider consult for mobility/activity intolerance   Manage environmental safety   Manage energy conservation for mobility/activity intolerance   Physical thrapy evaluation and treatment   Consider post-operative precautions   Occupational therapy evaulation and treatment   Cardiac Rehab evaluation and treatment     Problem: VTE, Risk of  Goal: Absence of venous thrombosis  Outcome: Goal Ongoing  Flowsheets (Taken 10/17/2023 1503 by Odetta Benes, RN)  Absence of venous thrombosis:   Utilize prevent measures   Assess peripheral circulation   Assess for signs and symptoms of venous thrombosis  Goal: Knowledge of warfarin regimen  Outcome: Goal Ongoing     Problem: Injury-Risk of, Non-Violent Physical Restraints  Goal: Absence of Injury while physically restrained (Non-Violent)  Outcome: Goal Ongoing     Problem: Nutrition Deficit  Goal: Adequate nutritional intake  Outcome: Goal Ongoing  Flowsheets (Taken 10/23/2023 0308)  Adequate nutritional intake:   Speech therapy swallowing assessment and management   Manage tube feeding and enteral nutritional administration   Assess nutritional status  Goal: Body weight within specified parameters  Outcome: Goal Ongoing

## 2023-10-23 NOTE — Progress Notes
 PHYSICAL THERAPY  NOTE      Name: Kevin Obrien   MRN: [id number]     DOB: 05/03/1951      Age: 73 y.o.  Admission Date: 10/17/2023     LOS: 6 days     Date of Service: 10/23/2023    Attempted to see patient for physical therapy; unable to awaken patient at this time despite verbal, light and tactile stimulation. Physical therapy will continue to follow up and provide intervention as indicated.     Therapist: Jolane Nations, PT  Date: 10/23/2023

## 2023-10-24 ENCOUNTER — Inpatient Hospital Stay: Admit: 2023-10-24 | Discharge: 2023-10-24 | Payer: MEDICARE

## 2023-10-24 LAB — BASIC METABOLIC PANEL
~~LOC~~ BKR ANION GAP: 9 fL (ref 3–12)
~~LOC~~ BKR BLD UREA NITROGEN: 14 mg/dL (ref 7–25)
~~LOC~~ BKR CALCIUM: 9.1 mg/dL (ref 8.5–10.6)
~~LOC~~ BKR CO2: 23 mmol/L (ref 21–30)
~~LOC~~ BKR CREATININE: 0.8 mg/dL (ref 0.40–1.24)
~~LOC~~ BKR GLOMERULAR FILTRATION RATE (GFR): >60 mL/min (ref >60)
~~LOC~~ BKR POTASSIUM: 3.9 mmol/L — ABNORMAL LOW (ref 3.5–5.1)

## 2023-10-24 LAB — POC GLUCOSE
~~LOC~~ BKR POC GLUCOSE: 131 mg/dL — ABNORMAL HIGH (ref 70–100)
~~LOC~~ BKR POC GLUCOSE: 147 mg/dL — ABNORMAL HIGH (ref 70–100)
~~LOC~~ BKR POC GLUCOSE: 170 mg/dL — ABNORMAL HIGH (ref 70–100)
~~LOC~~ BKR POC GLUCOSE: 205 mg/dL — ABNORMAL HIGH (ref 70–100)

## 2023-10-24 LAB — CULTURE - RESPIRATORY

## 2023-10-24 LAB — CBC
~~LOC~~ BKR HEMATOCRIT: 38 % — ABNORMAL LOW (ref 40.0–50.0)
~~LOC~~ BKR MCV: 89 fL — ABNORMAL HIGH (ref 80.0–100.0)
~~LOC~~ BKR RBC COUNT: 4.2 10*6/uL — ABNORMAL LOW (ref 4.40–5.50)
~~LOC~~ BKR WBC COUNT: 9.7 10*3/uL (ref 4.50–11.00)

## 2023-10-24 LAB — LACTIC ACID(LACTATE): ~~LOC~~ BKR LACTIC ACID: 1.4 mmol/L (ref 0.5–2.0)

## 2023-10-24 MED ORDER — LACTATED RINGERS IV BOLUS
1000 mL | Freq: Once | INTRAVENOUS | 0 refills | Status: CP
Start: 2023-10-24 — End: ?
  Administered 2023-10-24: 19:00:00 1000 mL via INTRAVENOUS

## 2023-10-24 MED ORDER — NALOXONE 0.4 MG/ML IJ SOLN
.08 mg | INTRAVENOUS | 0 refills | Status: DC | PRN
Start: 2023-10-24 — End: 2023-10-28

## 2023-10-24 MED ORDER — CLOPIDOGREL 75 MG PO TAB
75 mg | Freq: Every day | NASOGASTRIC | 0 refills | Status: DC
Start: 2023-10-24 — End: 2023-10-28
  Administered 2023-10-25 – 2023-10-28 (×5): 75 mg via NASOGASTRIC

## 2023-10-24 MED ORDER — ASPIRIN 81 MG PO CHEW
81 mg | Freq: Every day | NASOGASTRIC | 0 refills | Status: DC
Start: 2023-10-24 — End: 2023-10-28
  Administered 2023-10-25 – 2023-10-28 (×5): 81 mg via NASOGASTRIC

## 2023-10-24 MED ORDER — FENTANYL CITRATE (PF) 50 MCG/ML IJ SOLN
0 refills | Status: CP
Start: 2023-10-24 — End: ?

## 2023-10-24 MED ORDER — METOPROLOL TARTRATE 5 MG/5 ML IV SOLN
2.5 mg | INTRAVENOUS | 0 refills | Status: DC
Start: 2023-10-24 — End: 2023-10-24
  Administered 2023-10-24 (×2): 2.5 mg via INTRAVENOUS

## 2023-10-24 MED ORDER — GLUCAGON HCL 1 MG/ML IJ SOLR
0 refills | Status: CP
Start: 2023-10-24 — End: ?

## 2023-10-24 MED ORDER — ACETAMINOPHEN 1,000 MG/100 ML (10 MG/ML) IV SOLN
1000 mg | INTRAVENOUS | 0 refills | Status: DC | PRN
Start: 2023-10-24 — End: 2023-10-28
  Administered 2023-10-24 – 2023-10-25 (×2): 1000 mg via INTRAVENOUS

## 2023-10-24 MED ORDER — MIDAZOLAM 1 MG/ML IJ SOLN
0 refills | Status: CP
Start: 2023-10-24 — End: ?

## 2023-10-24 MED ORDER — LACTATED RINGERS IV SOLP
INTRAVENOUS | 0 refills | Status: AC
Start: 2023-10-24 — End: ?
  Administered 2023-10-24: 22:00:00 1000.0000 mL via INTRAVENOUS

## 2023-10-24 MED ORDER — FLUMAZENIL 0.1 MG/ML IV SOLN
.2 mg | INTRAVENOUS | 0 refills | Status: DC | PRN
Start: 2023-10-24 — End: 2023-10-28

## 2023-10-24 MED ORDER — MIDAZOLAM 1 MG/ML IJ SOLN
1 mg | Freq: Once | INTRAVENOUS | 0 refills | Status: CP
Start: 2023-10-24 — End: ?
  Administered 2023-10-24: 15:00:00 1 mg via INTRAVENOUS

## 2023-10-24 MED ORDER — METOPROLOL TARTRATE 25 MG PO TAB
25 mg | Freq: Two times a day (BID) | GASTROSTOMY | 0 refills | Status: DC
Start: 2023-10-24 — End: 2023-10-28
  Administered 2023-10-25 – 2023-10-28 (×8): 25 mg via GASTROSTOMY

## 2023-10-24 MED ORDER — ACETAMINOPHEN 325 MG PO TAB
650 mg | GASTROSTOMY | 0 refills | Status: DC | PRN
Start: 2023-10-24 — End: 2023-10-28
  Administered 2023-10-26 – 2023-10-28 (×4): 650 mg via GASTROSTOMY

## 2023-10-24 MED ORDER — ASPIRIN 300 MG RE SUPP
300 mg | Freq: Once | RECTAL | 0 refills | Status: DC
Start: 2023-10-24 — End: 2023-10-25

## 2023-10-24 MED ORDER — DICLOFENAC SODIUM 1 % TP GEL
2 g | Freq: Four times a day (QID) | TOPICAL | 0 refills | Status: DC | PRN
Start: 2023-10-24 — End: 2023-10-28
  Administered 2023-10-24: 11:00:00 2 g via TOPICAL

## 2023-10-24 MED ORDER — DIATRIZOATE MEG-DIATRIZOAT SOD 66-10 % PO SOLN
25 mL | Freq: Once | GASTROSTOMY | 0 refills | Status: CP
Start: 2023-10-24 — End: ?
  Administered 2023-10-24: 16:00:00 25 mL via GASTROSTOMY

## 2023-10-24 MED ORDER — ACETAMINOPHEN 650 MG RE SUPP
650 mg | RECTAL | 0 refills | Status: DC | PRN
Start: 2023-10-24 — End: 2023-10-28

## 2023-10-24 NOTE — Progress Notes
 Patient returned to IR for recovery post g-tube placement. Report given to bedside nurse. Hemostasis achieved at 1045. Bedrest complete at 1245. Strict NPO for 12 hrs, tube will be connected to a drainage bag. Drainage bag can be taken off and g-tube can be flushed with 50 cc of water at 2245. Clear liquid diet can begin as tolerated after strict 12 hr NPO per MD orders. G-tube can be used for feedings on 1045 at 5/15. Please call pre/post IR for any questions [phone].

## 2023-10-24 NOTE — Progress Notes
 Sedation physician present in room. Recent vitals and patient condition reviewed between sedating physician and nurse. Reassessment completed. Determination made to proceed with planned sedation.

## 2023-10-24 NOTE — Progress Notes
 SPEECH-LANGUAGE PATHOLOGY  NO TREATMENT NOTE     Attempted to see pt for dysphagia tx. Per chart review, pt NPO for IR g-tube placement this date. Will hold and f/u as appropriate. Continue to encourage pt to continue swallow exercises previously left at bedside.    Therapist: Raymondo Calin, SLP MA, [facility] Voalte# [id number]  Date: 10/24/2023

## 2023-10-24 NOTE — Case Management (ED)
 Case Management Progress Note    NAME:Kevin Obrien                          MRN: [id number]              DOB:18-Mar-1951          AGE: 73 y.o.  ADMISSION DATE: 10/17/2023             DAYS ADMITTED: LOS: 7 days      Today's Date: 10/24/2023    PLAN: Anticipate dc to [facility], pending medical stability and insurance auth.    Expected Discharge Date: 10/25/2023   Is Patient Medically Stable: No, Please explain: corpak  Are there Barriers to Discharge? no    INTERVENTION/DISPOSITION:  Discharge Planning                 SW reviewed EMR and met with NCM and Stroke team for huddle to discuss plan of care. Pt is getting PEG today. He may be stable for dc on Saturday.    SW updated [facility].    SW will continue to remain available and assist with discharge needs as they arise.    Transportation              Does the Patient Need Case Management to Arrange Discharge Transport? (ex: facility, ambulance, wheelchair/stretcher, Medicaid, cab, other): No  Will the Patient Use Family Transport?: Yes  Transportation Name, Phone and Availability #1: pt's daughters  Support                 Info or Referral                 Positive SDOH Domains and Potential Barriers                   Medication Needs                                                                                                                                                         Financial                 Legal                 Other                 Discharge Disposition  Dorisann Garre    Dorisann Garre, [place]  Inpatient Social Work Case Manager  Neurology General and Stroke  Available via [employer]  Cell: [phone]

## 2023-10-24 NOTE — Progress Notes
 OCCUPATIONAL THERAPY  PROGRESS NOTE      Name: Kevin Obrien   MRN: [id number]     DOB: Apr 14, 1951      Age: 73 y.o.  Admission Date: 10/17/2023     LOS: 7 days     Date of Service: 10/24/2023      Mobility  Patient Turn/Position: Supine  Mobility Level [facility] Highest Level of Mobility (JH-HLM): Sit edge on of bed  Level of Assistance: Assist X2  Assistive Device: None  Activity Limited By: Mental Status Variability;Weakness;Fatigue    Subjective  Significant Hospital Events: 73 y.o. male with hypertension, diabetes and prior CAD s/p PCI (~10 years ago) presented with acute right facial droop, left gaze and dysarthria.  Special Considerations: Feeding tube present (PEG tube 5/15)  Mental / Cognitive: Lethargic;Cooperative;Follows commands  Persons Present: Daughter;Occupational Therapist    Home Living Situation  Lives With: Family  Type of Home: House  Entry Stairs: 2  In-Home Stairs: Able to live on main level  Bathroom Setup: Tub/shower unit  Patient Owned Equipment: Cane: Single point  Comments: Pt denies prior use of DME.    Prior Level of Function  Level Of Independence: Independent with ADL and household mobility with device  Comments: intermittent use of SPC per daughter  History of Falls in Past 3 Months: Yes        ADL Mobility  Bed Mobility: Supine to Sit: Maximum assist;of 1st person;Minimal assist;of 2nd person (maxA for therapist positioned behind Pt assisting with trunk management and minA for therapist positioned in front providing guiding assist for Pt legs)  Bed Mobility: Sit to Supine: Maximum assist;x2 people (more assist required due to limitied functional movement of legs against gravity and therapist having to fully move BLE from EOB to supine due to strength deficit)  End of Activity Status: In bed;Nursing notified;Instructed patient to use call light;Instructed patient to request assist with mobility  Sitting Balance: Moderate assist;Static sitting balance;Dynamic sitting balance;No UE support (maxA during EOB activities when completing forward reaching/leaning to move back into upright poisition on EOB)        Activity Tolerance  Endurance: 2/5 Tolerates 10-20 Minutes Exercise w/Multiple Rests    Cognition  Expression: Expressive Aphasia  Social Interaction: Interacts in a Network engineer;Increased Time to Adjust  Attention: Awake/Alert    ROM  R UE ROM: WFL  L UE ROM: Not WFL  ROM Comments: Pt seated EOB completing forward reaching/walking hands forward and backwards on recliner chair placed in front. Pt completing x3 walk outs with hands in shoulder width position on chair and needing assist to move LUE forward and requiring maxA for motion of bringing self back to sitting upright on EOB. Cues given throughout for Pt to keep head upright. Pt then completing x2 walkouts with hands on arms of chair and requiring same amount of assistance. Pt finishing with 2 lateral leans to L part of chair to help decrease natural R lean that Pt presents with. Assist given to fully move R hand across midline and place LUE on arm of chair. Increased time to complete all forward/backward leans with verbal cues for optimal positoining.      Education  Persons Educated: Patient/Family  Barriers To Learning: Impaired Communication  Interventions: Repetition of Instructions;Physical Cueing  Teaching Methods: Verbal Instruction;Demonstration  Patient Response: Return Demonstration;More Instruction Required  Topics: Role of OT, Goals for Therapy;Energy Conservation  Goal Formulation: With Patient/Family  Comments: Pt educated on purpose of EOB activities  Assessment  Assessment: Decreased ADL Status;Decreased UE ROM;Decreased UE Strength;Decreased Endurance  Prognosis: Fair;w/Cont OT s/p Acute Discharge  Goal Formulation: Pt/family    AM-PAC 6 Clicks Daily Activity Inpatient  Putting on and taking off regular lower body clothes: Total  Bathing (Including washing, rinsing, drying): Total  Toileting, which includes using toilet, bedpan, or urinal: Total  Putting on and taking off regular upper body clothing: Total  Taking care of personal grooming such as brushing teeth: Total  Eating meals: Total  Daily Activity Raw Score: 6  Standardized (T-scale) Score: 17.07    Plan  OT Frequency: 5x/week  OT Plan for Next Visit: outcome measure, EOB activities, move to recliner (hoyer vs stand/squat pivot?)      ADL Goals  Patient Will Perform All ADL's: w/ Stand By Assist    Functional Transfer Goals  Pt Will Perform All Functional Transfers: w/ Stand By Assist    OT Discharge Recommendations  Recommendation: Inpatient setting;Recommend rehab medicine consult  Patient Currently Requires Physical Assist With: All mobility;All personal care ADLs;All home functioning ADLs        Therapist: Orvilla Truett  Date: 10/24/2023

## 2023-10-24 NOTE — Progress Notes
 PHYSICAL THERAPY  NOTE      Name: Dontray Haberland   MRN: [id number]     DOB: 1951-03-27      Age: 73 y.o.  Admission Date: 10/17/2023     LOS: 7 days     Date of Service: 10/24/2023      Attempted to see patient this AM with rehab tech. Pt off the unit for procedure will follow up for therapy as scheduled.    Therapist: Nohemi Batters, PT  Date: 10/24/2023

## 2023-10-24 NOTE — Progress Notes
 Vascular Neurology Progress Note      Kevin Obrien  Admission Date: 10/17/2023  LOS: 7 days     Assessment/Plan:  Principal Problem:    Ischemic stroke (CMS-HCC)  Active Problems:    Diabetes mellitus (CMS-HCC)    Neuropathy    Peripheral artery disease    Essential tremor    Poorly controlled diabetes mellitus (CMS-HCC)    Gaze preference    Moderate malnutrition      Kevin Obrien is a 73 y.o. male with hypertension, diabetes and prior CAD s/p PCI (~10 years ago) presented with acute right facial droop, left gaze and dysarthria and was stroke activated. Initial NIH was 6, and the LKW was the night before. His BP at arrival was 210/85. CT head was negative for acute changes, CTA H&N showed no LVO but significant calcified athero and diffuse intracranial vascular stenoses.  He did not receive TNK due to LKW time and no EVT due to no LVO.  He was loaded with Aspirin and Plavix  with plan for DAPT for 3 months. A1c was 11.7%, endocrine consulted.  ECHO showed a PFO with shunt, but ultrasound of the lower extremities was without DVT. MRI brain showed a right sided pontomedullary brainstem infarct.  In light of the results of the MRI, all his clinical symptoms (dysphagia, facial weakness, gaze deviation) can be explained with the stroke location. He still has corpak, will work with SLP for swallow recs.  Pending d/c to [facility] when medically stable.      Neuro exam 10/24/23  Bilateral gaze Palsy, L facial droop, dysarthria, L UE 3/5, 5/5 throughout elsewhere      Acute R pontomedullary infarct  Hx of CAD s/p PCI  Diffuse intracranial athero  Right sided facial weakness (upper and lower)  Forced left gaze deviation  Dysphagia s/p PEG tube placement on 5/15  LUE Weakness    NeuroImaging  CT Head  1.  No acute intracranial hemorrhage or mass effect.   2.  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   3.  Additional cerebral hypodensities, likely secondary to chronic microvascular ischemic changes.   CTA head  1.  Multifocal stenosis of the anterior, middle, and posterior cerebral arteries most likely secondary to significant intracranial atherosclerosis. No large vessel central occlusion is identified   2.  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   CTA neck  1.  Bilateral atherosclerotic plaque at the carotid bulbs and origins of the ICAs without significant/high-grade stenosis of the ICAs by NASCET criteria.   2. 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   3. 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   MRI Brain  1.  Small acute right pontomedullary brainstem infarct   2.  Stable old left basal ganglia infarct and old left thalamic and right supratentorial white matter lacunar infarcts.   3.  Stable generalized cerebral volume loss and moderate patchy supratentorial white matter FLAIR hyperintensities, likely due to chronic microvascular ischemia.   4.  Intracranial right vertebral stenotic-occlusive disease, better assessed on CTA.     Stroke work up  A1c:  11.7  Fasting Lipid Profile: TG 219, Chol 216, LDL 161  TTE: EF of  65%, bubble study shows a Right to Left Shunting  consistent with a PFO, no thrombus/valvular vegetation, LA index WNL  P2Y12 138 - hence he is a Plavix responder  Hypercoagulable work up: not indicated      Stroke Etiology - large artery athero  On 5/14, patient noted to have a new left gaze palsy in addition to the original R gaze palsy, and more pronounced LUE weakness - given the location of stroke on MRI, it is likely related to evolution of the stroke, and stuttering symptoms as it is lacunar stroke. Will get CT head to monitor.      PLAN   > Continue DAPT with aspirin 81 mg daily and clopidogrel 75 mg daily for 90 days followed by aspirin 81 mg indefinitely for secondary stroke prevention. PEG tube needs to be placed while being on DAPT given the risk/benefit ratio , the benefit of being on DAPT for stroke prevention outweighs the risk fo bleeding from the PEG tube placement   > PT OT recommending rehab  > Continue Crestor 40 mg daily per cardiology recs  > Continue Zosyn 4.5 g IV Q6 for suspected aspiration pneumonia - total Abx duration to be 7 days  > Holding off of additional BP management in setting of aspiration pneumonia/sepsis    Aspiration pneumonia  Acute Hypoxemic Respiratory Failure  SIRS (2/4)  Increasing oxygen requirements overnight on 5/13, tachycardic, leukocytosis , elevated procal  > Repeat CXR 5/13 showed mild basal opacities, favored to represent atelectasis  > Received 1 dose vancomycin 1,250 mg 5/13  > Continue Zosyn 4.5 g Q6 IV started 5/13 x5 days  > Sputum cultured      Hypertension  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 Hold if tube feeds are held  Continue correction factor q6h    Dysphagia  S/p PEG tube placement  PEG tube placed on 5/15 - okay to use PEG tube for meds after 12 hours after placement, and then resume tube feeds for 24 hours after the PEG tube placement  Patient does not have a corpak in place as it is causing him a lot of discomfort, hence until the PEG tube can be used, only IV medications.  > Please make sure to give his Asa81mg  and Plavix 75mg  on 5/15 after he can start using his PEG tube for meds, otherwise give rectal aspirin one time dose  > 1L LR on 5/15 and then mIVF with LR @75ml /hr for 24 hours    LVOT Obstruction  PFO  > Metoprolol 12.5mg  BID - will resume oral dosing on 5/16      DVT Prophylaxis: Lovenox 40mg  qday   Anticoagulation: none  PT/OT recs: [facility]  Diet: N.p.o.,  Dispo: likely on 5/17 depending on clinical status  Code: Full      Patient seen and discussed with Dr. Alonso Jan who helped formulate the assessment and plan.        Markella Dao, MBBS  Neurology, PGY4  Available on Voalte      _______________________________________________________________________  Subjective:  visited with patient in IR pre post - had his PEG tube placed - denied pain. No other concerns      Objective:                  Vital Signs:  Last Filed                   Vital Signs: 24 Hour Range   BP: 128/64 (05/15 0632)  Temp: 36.7 ?C (98.1 ?F) (05/15 8295)  Pulse: 77 (05/15 6213)  Respirations: 16 PER MINUTE (05/15 0632)  SpO2: 98 % (05/15 0632)  O2 Device: None (Room air) (05/15 0632)  O2 Liter Flow: 2 Lpm (05/15 0552)  BP: (128-157)/(63-77)   Temp:  [36.7 ?C (98.1 ?F)-37.6 ?C (99.7 ?F)]   Pulse:  [77-108]   Respirations:  [16 PER MINUTE-32 PER MINUTE]   SpO2:  [95 %-99 %]   O2 Device: None (Room air)  O2 Liter Flow: 2 Lpm    Intensity Pain Scale (Self Report): (not recorded)    Intake/Output Summary: (Last 24 hours)    Intake/Output Summary (Last 24 hours) at 10/24/2023 0721  Last data filed at 10/24/2023 0604  Gross per 24 hour   Intake 1518 ml   Output 2077 ml   Net -559 ml         Medications:  Scheduled Meds:[Held by Provider] amLODIPine (NORVASC) tablet 10 mg, 10 mg, Per NG tube, QDAY  [Held by Provider] aspirin chewable tablet 81 mg, 81 mg, Per NG tube, QDAY  aspirin rectal suppository 300 mg, 300 mg, Rectal, ONCE  [Held by Provider] clopiDOGreL (PLAVIX) tablet 75 mg, 75 mg, Per NG tube, QDAY  enoxaparin (LOVENOX) syringe 40 mg, 40 mg, Subcutaneous, QDAY(21)  insulin aspart (U-100) (NOVOLOG FLEXPEN U-100 INSULIN) injection PEN 0-12 Units, 0-12 Units, Subcutaneous, Q6H  insulin glargine (LANTUS SOLOSTAR U-100 INSULIN) injection PEN 18 Units, 18 Units, Subcutaneous, QDAY  insulin regular human (NOVOLIN R FLEXPEN) injection PEN 23 Units, 23 Units, Subcutaneous, Q6H  [Held by Provider] lisinopriL (ZESTRIL) tablet 5 mg, 5 mg, Feeding Tube, QDAY  metoprolol (LOPRESSOR) injection 2.5 mg, 2.5 mg, Intravenous, Q6H*  [Held by Provider] metoprolol tartrate tablet 25 mg, 25 mg, Feeding Tube, BID  piperacillin/tazobactam (ZOSYN) 4.5 g in sodium chloride 0.9% (NS) 100 mL IVPB (MB+), 4.5 g, Intravenous, Q6H*  [Held by Provider] Protein Supplement Packets, , SEE ADMIN INSTRUCTIONS, QDAY  [Held by Provider] rosuvastatin (CRESTOR) tablet 40 mg, 40 mg, Feeding Tube, QDAY  thiamine mononitrate (vit B1) tablet 100 mg, 100 mg, Per NG tube, QDAY    Continuous Infusions:   Diet Enteral Feeding Standard Infusion Stopped (10/23/23 1500)     PRN and Respiratory Meds:acetaminophen (OFIRMEV) IV Q8H PRN, [Held by Provider] acetaminophen Q4H PRN **OR** [Held by Provider] acetaminophen Q4H PRN, dextrose 50% (D50) IV PRN, diclofenac sodium QID PRN, lidocaine hcl PRN, pancrelipase 20,880 Units/sodium bicarbonate 650 mg ([place] CLOG DESTROYER) PRN (On Call from Rx), phenoL PRN       Neuro exam:   Mental status: Drowsy but opens eyes when spoken to  Speech: Dysarthria   Cranial Nerves:    Normal Abnormal   II Pupils reactive, visual fields normal    III, IV, VI  Patient does not move eyes to the left or right, vertical eye movements intact   V Sensation nml V1-V3    VII     VIII nml to conversation    IX, X +strong cough    XI Equal shoulder  shrug    XII Tongue midline    Muscle/motor: 5/5 in RUE and RLE, 4/5 in LLE, 3/5 in LUE  Sensation: Normal in bilateral upper and lower extremities, no abnormalities   Coordination: Not assessed due to patient fatigue  Gait and Sation: Not assessed    Laboratory Review:  No results found for: PHART, PCO2A, PO2ART, HCO3A, BASEEXA, BASEDEFA, O2SATACAL  Lab Results   Component Value Date    HGB 12.6 (L) 10/24/2023    HCT 38.4 (L) 10/24/2023    PLTCT 194 10/24/2023    WBC 9.70 10/24/2023    NEUT 82.5 (H) 10/22/2023    ANC 10.90 (H) 10/22/2023     Lab Results   Component Value Date    NA 135 (L) 10/24/2023    K 3.9 10/24/2023    CL 103 10/24/2023    CO2 23 10/24/2023    GAP 9 10/24/2023    BUN 14 10/24/2023    CR 0.85 10/24/2023    GLU 151 (H) 10/24/2023    CA 9.1 10/24/2023    MG 1.8 10/21/2023    LACTIC 0.9 10/24/2023    TOTPROT 7.3 10/22/2023    AST 19 10/22/2023    ALT 21 10/22/2023    ALKPHOS 71 10/22/2023     Lab Results   Component Value Date    TNI 0.00 02/22/2019     Lab Results   Component Value Date    PTT 27.6 10/17/2023    INR 1.0 (L) 10/17/2023    DIMER 254 02/22/2019     Lab Results   Component Value Date    TSH 0.40 06/05/2023     Lab Results   Component Value Date    CHOL 216 (H) 10/17/2023    TRIG 219 (H) 10/17/2023    HDL 36 (L) 10/17/2023    LDL 161 (H) 10/17/2023    VLDL 43.8 10/17/2023     No results found for: VANPK, VANTR  No results found for: CYCLOSPOR, TACROLIMUS, FREEPHENY     Point of Care Testing:  (Last 24 hours):  FSBS (Manual): (!) 291  Glucose: (!) 151  POC Glucose (Download): (!) 147    Radiology and Other Diagnostics Reviewed      Gustavus Leisure, MBBS   Vascular Neurology

## 2023-10-24 NOTE — Unmapped
 Immediate Post Procedure Note    Date:  10/24/2023                                         Attending Physician:   Peggye Bowers, MD  Performing Provider:  Ermalinda Hays, MD    Consent:  Consent obtained from patient.  Time out performed: Consent obtained, correct patient verified, correct procedure verified, correct site verified, patient marked as necessary.  Pre/Post Procedure Diagnosis:  Ischemic stroke  Indications:  Ischemic stroke      Procedure(s):  G tube placement  Findings:  Successful G tube placement     Estimated Blood Loss:  None/Negligible  Specimen(s) Removed/Disposition:  None  Complications: None  Patient Tolerated Procedure: Well  Post-Procedure Condition:  stable    Ermalinda Hays, MD  Pager

## 2023-10-24 NOTE — Case Management (ED)
 Per chart review, pt getting PEG placed today. Pt on IV BP meds and IV pain meds - would need to be off for 12 hours and on oral. Pt needs to work with PT.     Catherene Close, BSN, RN   [facility] Admission Nurse (office: [phone] or voalte)

## 2023-10-24 NOTE — Consults
 IR Pre-Procedure History and Physical/Sedation Plan    Procedure Date: 10/24/2023     Planned Procedure(s):  G-tube placement    Procedural code status: Full Code    Indication:  Feeding, med admin  __________________________________________________________________    Chief Complaint:  Dysphagia 2/2 Stroke    History of Present Illness: Kevin Obrien is a 73 y.o. male with a history as listed below who presents today for procedure.    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/10/2020    Weak urinary stream 05/25/2019    Chest pain 02/22/2019    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) 01/30/2019    Combined forms of age-related cataract of both eyes     History of Helicobacter pylori infection 10/30/2016    Peripheral artery disease 03/11/2016    Neuropathy 02/27/2016    Abdominal pain 08/31/2015    Erectile dysfunction 09/21/2011    Diabetes mellitus (CMS-HCC) 07/17/2011    Hyperlipidemia 07/17/2011     Past Medical History:    Accidental fall    Angina pectoris    Arthritis    BPH (benign prostatic hyperplasia)    Cataract    Constipation    Coronary artery disease    DM (diabetes mellitus) (CMS-HCC)    ED (erectile dysfunction)    Heart attack (CMS-HCC)    Hyperlipidemia    Hypertension    Joint pain    Neuropathy    Shingles      Surgical History:   Procedure Laterality Date    CORONARY STENT PLACEMENT  2016    HX CHOLECYSTECTOMY  09/2015    COLONOSCOPY N/A 04/20/2016    Performed by Karalee Oscar, MD at [facility]    ESOPHAGOGASTRODUODENOSCOPY N/A 04/20/2016    Performed by Karalee Oscar, MD at [facility]    ESOPHAGOGASTRODUODENOSCOPY BIOPSY  04/20/2016    Performed by Karalee Oscar, MD at [facility]    UREA BREATH TEST N/A 11/13/2016    Performed by Dearl Fabry, MD at [facility] END[facility]      Social History     Tobacco Use    Smoking status: Former     Current packs/day: 0.00     Average packs/day: 2.0 packs/day for 10.0 years (20.0 ttl pk-yrs)     Types: Cigarettes     Start date: 04/11/1961     Quit date: 04/12/1971     Years since quitting: 52.5    Smokeless tobacco: Never    Tobacco comments:     Former smoker/quit years ago   Substance Use Topics    Alcohol use: Not Currently      Family History   Problem Relation Name Age of Onset    Diabetes Mother AMPARO CUBIAS     Diabetes Father JOSE Wind     Cancer-Lung Father JOSE Fulco     Melanoma Neg Hx  Cataract Neg Hx      Glaucoma Neg Hx      Macular Degen Neg Hx        No medications prior to admission.     Allergies   Allergen Reactions    Seasonal Allergies RHINITIS       Review of Systems  A comprehensive review of systems was negative.    Previous Personal Anesthetic/Sedation History:  Denies adverse events related to sedation/anesthesia.     Previous Family Anesthetic/Sedation History: Denies adverse events related to sedation/anesthesia.    Physical Exam:  Vital Signs: Last Filed In 24 Hours Vital Signs: 24 Hour Range   BP: 116/60 (05/15 0830)  Temp: 36.6 ?C (97.8 ?F) (05/15 0830)  Pulse: 74 (05/15 0830)  Respirations: 13 PER MINUTE (05/15 0830)  SpO2: 96 % (05/15 0830)  O2 Device: None (Room air) (05/15 0830)  O2 Liter Flow: 2 Lpm (05/15 0552) BP: (116-157)/(60-77)   Temp:  [36.6 ?C (97.8 ?F)-37.6 ?C (99.7 ?F)]   Pulse:  [74-92]   Respirations:  [13 PER MINUTE-32 PER MINUTE]   SpO2:  [95 %-99 %]   O2 Device: None (Room air)  O2 Liter Flow: 2 Lpm General appearance: Alert and no distress noted.  Neurologic: Grossly normal.  Lungs: Non labored at rest.  Heart: Regular rate and rhythm  Abdomen: Non-distended    Airway:  airway assessment performed  Mallampati II (soft palate, uvula, fauces visible)   Anesthesia Classification:  ASA III (A patient with a severe systemic disease that limits activity, but is not incapacitating)  Pre procedure anxiolysis plan: Midazolam  Intra-procedural Sedation/Medication Plan: Fentanyl, Lidocaine, and Midazolam  Personal history of sedation complications: Denies adverse event.   Family history of sedation complications: Denies adverse event.   Medications for Reversal: Naloxone and Flumazenil  Discussion/Reviews:  Physician has discussed risks and alternatives of this type of sedation and above planned procedures with patient  NPO Status: Acceptable  Pregnancy Status: N/A     Lab/Radiology/Other Diagnostic Tests:  Labs:  Pertinent labs reviewed           Andris Banning, APRN-NP  Pager [phone]

## 2023-10-24 NOTE — Progress Notes
 Talked to Dr. Kara Orts about not holding plavix before g tube procedure. Doc will put note in for procedure. IR doc and NP aware. Okay to proceed with G tube placement.

## 2023-10-25 ENCOUNTER — Inpatient Hospital Stay: Admit: 2023-10-25 | Discharge: 2023-10-25 | Payer: MEDICARE

## 2023-10-25 ENCOUNTER — Encounter: Admit: 2023-10-25 | Discharge: 2023-10-25 | Payer: MEDICARE

## 2023-10-25 LAB — POC GLUCOSE
~~LOC~~ BKR POC GLUCOSE: 146 mg/dL — ABNORMAL HIGH (ref 70–100)
~~LOC~~ BKR POC GLUCOSE: 153 mg/dL — ABNORMAL HIGH (ref 70–100)
~~LOC~~ BKR POC GLUCOSE: 131 mg/dL — ABNORMAL HIGH (ref 70–100)
~~LOC~~ BKR POC GLUCOSE: 136 mg/dL — ABNORMAL HIGH (ref 70–100)
~~LOC~~ BKR POC GLUCOSE: 196 mg/dL — ABNORMAL HIGH (ref 70–100)

## 2023-10-25 MED ORDER — AMOXICILLIN-POT CLAVULANATE 875-125 MG PO TAB
875 mg | Freq: Two times a day (BID) | GASTROSTOMY | 0 refills | Status: DC
Start: 2023-10-25 — End: 2023-10-28
  Administered 2023-10-25 – 2023-10-28 (×6): 875 mg via GASTROSTOMY

## 2023-10-25 MED ORDER — INSULIN REGULAR HUMAN 100 UNIT/ML (3 ML) SC INPN
23 [IU] | SUBCUTANEOUS | 0 refills | Status: CN
Start: 2023-10-25 — End: ?

## 2023-10-25 MED ORDER — SERTRALINE 20 MG/ML PO CONC
50 mg | Freq: Every day | GASTROSTOMY | 0 refills | Status: DC
Start: 2023-10-25 — End: 2023-10-28
  Administered 2023-10-25 – 2023-10-28 (×4): 50 mg via GASTROSTOMY

## 2023-10-25 MED ORDER — LISINOPRIL 5 MG PO TAB
5 mg | Freq: Every day | GASTROSTOMY | 0 refills | Status: CN
Start: 2023-10-25 — End: ?

## 2023-10-25 MED ORDER — IMS MIXTURE TEMPLATE
30 meq | Freq: Once | ORAL | 0 refills | Status: CP
Start: 2023-10-25 — End: ?
  Administered 2023-10-25 (×2): 30 meq via ORAL

## 2023-10-25 MED ORDER — FLUOXETINE 20 MG/5 ML (4 MG/ML) PO SOLN
20 mg | Freq: Every day | ORAL | 0 refills | Status: DC
Start: 2023-10-25 — End: 2023-10-25

## 2023-10-25 MED ORDER — TRAZODONE 50 MG PO TAB
50 mg | Freq: Once | ORAL | 0 refills | Status: CP
Start: 2023-10-25 — End: ?
  Administered 2023-10-26: 05:00:00 50 mg via ORAL

## 2023-10-25 NOTE — Progress Notes
 OCCUPATIONAL THERAPY  PROGRESS NOTE      Name: Kevin Obrien   MRN: [id number]     DOB: 1950-11-02      Age: 73 y.o.  Admission Date: 10/17/2023     LOS: 8 days     Date of Service: 10/25/2023      Mobility  Patient Turn/Position: Supine  Mobility Level [facility] Highest Level of Mobility (JH-HLM): Move to Chair/Commode  Distance Walked (feet): 2 ft  Level of Assistance: Assist X1  Assistive Device: Hand Held  Activity Limited By: Mental Status Variability;Weakness;Fatigue    Subjective  Significant Hospital Events: 73 y.o. male with hypertension, diabetes and prior CAD s/p PCI (~10 years ago) presented with acute right facial droop, left gaze and dysarthria.  Special Considerations: Feeding tube present (PEG tube 5/15)  Mental / Cognitive: Alert;Cooperative;Follows commands  Persons Present: Daughter;Occupational Therapist    Home Living Situation  Lives With: Family  Type of Home: House  Entry Stairs: 2  In-Home Stairs: Able to live on main level  Bathroom Setup: Tub/shower unit  Patient Owned Equipment: Cane: Single point  Comments: Pt denies prior use of DME.    Prior Level of Function  Level Of Independence: Independent with ADL and household mobility with device  Comments: intermittent use of SPC per daughter  History of Falls in Past 3 Months: Yes        ADL's  Where Assessed: Commode  LE Dressing Assist: Maximum Assist  LE Dressing Deficits: Thread RLE Into Underwear;Thread LLE Into Underwear;Pull Up Over Hips;Increased Time To Complete;Supervision/Safety;Steadying  Toileting Assist: Minimal Assist  Toileting Deficits: Steadying;Supervision/Safety;Increased Time To Complete;Bedside Commode (Pt seated on commode completing posterior hygiene and able to peform majority of cleaning. Therapist wiping Pt once standing to ensure full cleanse)    ADL Mobility  Bed Mobility: Supine to Sit: Minimal assist  Bed Mobility: Sit to Supine: Maximum assist  Bed Mobility Comments: Pt with increased IND with bed mobility this session compared to previous sessions. Able to intiate movment of legs from supine to sitting EOB and therapist providing assist to get trunk fully into upright position  Transfer Type: Squat pivot  Transfer: Assistance Level: From;Bed;To;Commode;Moderate assist  Transfer: Assistive Device: Hand hold assist  Transfer: Type of Assistance: For balance;For safety considerations;For strength deficit  Other Transfer Type: Sit to/from stand;Stand pivot  Other Transfer: Assistance Level: From;Bedside chair;To/from;Bed;Moderate assist;Verbal cues (Pt modA for stand pivot transfer with verbal cues for advancing feet during transfer. Pt modA for 4 STS trials from EOB with VC for Pt to adjust BOS to provide more stability during standing. Pt anxious during standing and not able to adjust BOS)  Other Transfer: Assistive Device: Hand hold assist  Other Transfer: Type of Assistance: Verbal cues;For balance;For safety considerations  End of Activity Status: In bed;Nursing notified;Instructed patient to use call light;Instructed patient to request assist with mobility  Sitting Balance: Static sitting balance;2 UE support;No UE support;Moderate assist (Pt able to sit on commode without physical assist but needing modA for sitting balance on EOB)  Standing Balance: 2 UE support;Minimal assist;Moderate assist  Gait Distance: 2 feet  Gait: Assistance Level: Moderate assist;Safety considerations  Gait: Assistive Device: Hand hold assist        Activity Tolerance  Endurance: 3/5 Tolerates 25-30 Minutes Exercise w/Multiple Rests    Cognition  Expression: Expressive Aphasia  Social Interaction: Interacts in a Network engineer;Increased Time to Adjust  Problem Solving: Cueing to Sequence Task;Decreased Judgment/Safety;Direction Following Assist  Orientation: Alert &  Oriented x3;To Person;To Place;To Situation  Attention: Awake/Alert      Education  Persons Educated: Patient/Family  Barriers To Learning: Impaired Communication  Interventions: Repetition of Instructions;Physical Cueing  Teaching Methods: Verbal Instruction;Demonstration  Patient Response: Return Demonstration;More Instruction Required  Topics: Role of OT, Goals for Therapy;Energy Conservation  Goal Formulation: With Patient/Family    Assessment  Assessment: Decreased ADL Status;Decreased UE ROM;Decreased UE Strength;Decreased Endurance  Prognosis: Fair;w/Cont OT s/p Acute Discharge  Goal Formulation: Pt/family    AM-PAC 6 Clicks Daily Activity Inpatient  Putting on and taking off regular lower body clothes: A Lot  Bathing (Including washing, rinsing, drying): Total  Toileting, which includes using toilet, bedpan, or urinal: A Little  Putting on and taking off regular upper body clothing: A Little  Taking care of personal grooming such as brushing teeth: A Little  Eating meals: Total  Daily Activity Raw Score: 13  Standardized (T-scale) Score: 32.03    Plan  OT Frequency: 5x/week  OT Plan for Next Visit: outcome measure, EOB activities, move to recliner      ADL Goals  Patient Will Perform All ADL's: w/ Stand By Assist    Functional Transfer Goals  Pt Will Perform All Functional Transfers: w/ Stand By Assist      OT Discharge Recommendations  Recommendation: Inpatient setting;Recommend rehab medicine consult  Patient Currently Requires Physical Assist With: All mobility;All personal care ADLs;All home functioning ADLs  Patient Currently Requires Supervision For: Making decisions about safety        Therapist: Soffia Doshier  Date: 10/25/2023

## 2023-10-25 NOTE — Rehab Pre-Admission Screening
 Physical Medicine & Rehabilitation Pre-Admission Screening    [facility] Kevin Obrien is a 73 y.o.  male.     DOB: 02-04-1951                  MRN#:  [id number]  Primary Insurance: [employer] MEDICARE  Secondary Insurance:  MEDICAID  Financial Class: Medicare Repl  Date of Hospital Admission:  10-17-2023  Date of Expected Rehab Admission:  5-? -2025  Precautions:  Fall  Weight bearing Precautions:  WBAT    Medical Course    Hospital Course:       [name] is a 73 y.o. male and  has a past medical history of Accidental fall, Angina pectoris, Arthritis, BPH (benign prostatic hyperplasia), Cataract, Constipation, Coronary artery disease, DM (diabetes mellitus) (CMS-HCC), ED (erectile dysfunction), Heart attack (CMS-HCC), Hyperlipidemia, Hypertension, Joint pain, Neuropathy, and Shingles. The patient presented to [facility] on 10/17/2023 with left gaze deviation, facial weakness and dysarthria.  CT head was negative for acute changes.  CTA head and neck without LVO.  Patient was not a TNK or EVT candidate. 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 Neurology recommend DAPT with aspirin and Plavix.  He is currently on permissive hypertension. The patient's hospital course is functionally complicated by impaired mobility and ADLs.       Patient has been working with physical and occupational therapy since 10-18-2023 and has been making functional gains. Patient is anticipated to return to home at the supervision level of care. Patient has also been working with speech language pathology on dysphagia; however will also receive formal cognitive evaluation to assess for baseline status and any new cognitive/linguistic deficits.         Prior Level of Function        Self-Care/ADLs: Independent with ADL and household mobility with device   Mobility: Independent with ADL and household mobility with device   Work/Personal Responsibilities/Hobbies:  n/a     Home Environment:    Type of Home: House (10/18/2023 12:00 PM)   Entry Stairs: 2 (10/18/2023 12:00 PM)   In-Home Stairs: Able to live on main level (10/18/2023 12:00 PM)         Support System:  Spouse / Significant Other  Other DME:  SPC       Current Level of Function    Physical Therapy: 10-18-2023  ROM  ROM Comments: BLEs WFLs.     Strength  Overall Strength: Able to move all joints independently through available ROM     Bed Mobility/Transfer  Bed Mobility: Supine to Sit: Moderate Assist;Assist with Trunk;Head of Bed Elevated;No Rail  Transfer Type: Sit to/from Stand  Transfer: Assistance Level: To/From;Bed;Minimal Assist;x2 People  Transfer: Assistive Device: Nurse, adult  Transfers: Type Of Assistance: For Balance;For Safety Considerations  End Of Activity Status: Up in Chair;Nursing Notified;Instructed Patient to Request Assist with Mobility;Instructed Patient to Use Call Light (pads alarm activated)     Gait  Gait Distance: 30 feet  Gait: Assistance Level: Minimal Assist;x2 People (for guidance of walker and balance)  Gait: Assistive Device: Roller Walker  Gait: Descriptors: Pace: Slow;Loss of balance;Variable step length     Occupational Therapy: 10-25-2023    Bed Mobility/Transfer  Bed Mobility: Supine to Sit: Moderate Assist;Assist with Trunk;Assist with B LE;Head of Bed Elevated;No Rail  Transfer Type: Sit to/from [name]: Assistance Level: From;Bed;To;Commode;Minimal Assist;x2 People  Transfer: Assistive Device: Doctor, general practice  Transfers: Type Of Assistance: Verbal Cues;For Balance;For Strength Deficit;For Safety  Considerations;Requires Extra Time  Other Transfer Type: Mechanical Lift  Other Transfer: Assistance Level: From;Commode;To;Bed Side Chair;Moderate Assist;x2 People  Other Transfer: Assistive Device: Management consultant Abraham Hoffmann stedy)  Other Transfer: Type Of Assistance: Verbal Cues;For Balance;For Strength Deficit;For Safety Considerations;Requires Extra Time  End Of Activity Status: Up in Chair;Nursing Notified;Instructed Patient to Request Assist with Mobility;Instructed Patient to Use Call Light (alarm activated)  Comments: Pt assited to commode per pt request for BM; total assist for hygiene. Abraham Hoffmann stedy trialed to transfer back to chair. Pt requires increased assist for lateral stability and sequencing with use of Sara stedy. Continued use of Hoyer recommended for transfers with nursing staff.     Gait  Comments: Deferred this session due to time constraints after pt uses commode.        Speech Therapy: 10-18-2023  EVALUATION SUMMARY   A timeout procedure was completed prior to assessment confirming two patient identifiers.   Reviewed nature of procedure and clarified possible dietary restrictions before PO trials provided.      Fiberoptic endoscopic evaluation of swallowing (FEES) completed.  Utilized AMBU Single patient use scope. Lot #: [id number]  Overall Impression: Moderate-severe oropharyngeal dysphagia  Sources of Dysphagia: impaired motor processing/incoordination w/ a component of weakness     Swallow Recommendations (10/21/2023)  Treatment Summary:   Dysphagia therapy completed.     Moderate-severe oropharyngeal dysphagia  Sources of Dysphagia:  impaired motor processing/incoordination w/ a component of weakness   Persons Educated: Patient/Family  Prognosis: Fair     Swallow Recommendations  PO: Ice chips only w/ excellent oral care to minimize potential of aspirating oral bacteria   NPO: Consider short term non-oral nutrition  Medications: non-oral   Supervision: yes when taking ice  Dysphagia Management: dysphagia tx & anticpate need for repeat instrumental swallow eval given time/videoswallow study   Oral Hygiene: Complete oral care to minimize the risk of aspirating oral bacteria      Rehabilitation Plan    Rehab Diagnosis and conditions that require Rehabilitation:      stroke   Cognitive impairment, Dysphagia, Pain, and Weakness       Active Comorbidities/Risk of Medical Complications:     Stroke: 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 Neurology recommend DAPT with aspirin and Plavix.  Cognitive Impairment: Patient with cognitive impairment.  May need 24 hour supervision upon discharge home.  Dysphagia: Patient with moderate to severe oropharyngeal dysphagia. Currently tolerating corpak with TF's plus ice chips only.  Dysphagia puts them at risk for aspiration and pneumonia.  Aspiration precautions in place.  Falls:  Patient will be placed close to the nurse's station for closer monitoring. Staff will round on the patient frequently to assess for any needs, such as using the restroom to help prevent falls as this increases the patient's risk for morbidity/mortality and 30 day readmission to the hospital rate.   Diabetes: Patient is at risk for hyper/hypoglycemia during aggressive mobilization with therapies, and will need daily physician monitoring and adjustment of medications.  Patient may benefit from ongoing education from diabetes nurse educator for healthy lifestyle modifications.  Hyperlipidemia: Will continue to monitor and manage, and adjust medications as needed.  Hypertension: Patient is at risk for hyper/hypotension during aggressive mobilization program with therapies, and requires daily physician monitoring and adjustment of medications.  Will continue to monitor and manage for optimal blood pressure control and resume home medications as able.  Leukocytosis: Patient is at risk for SIRS, sepsis and needs daily physician monitoring and management of  acute illness. Will continue to monitor and manage.  Risk for Pain: There is a risk of uncontrolled pain, risk of failure to participate in therapies, and risk of sedation due to pain medications.  This will require daily medication adjustments to find the balance between meaningful participation in therapies and pain control, avoid the potential for addiction, while facilitating the rehabilitation for their condition. Will continue to monitor and adjust medication regimen in order to maximize pain relief.  Risk for Thrombosis: Patient has Lovenox, sequential compression devices ordered.  Patient will be encouraged to ambulate frequently with the assistance of health care provider.  Risk for Stroke: Will monitor for acute neurological changes and manage accordingly, as patient is at risk for repeat stroke, hemorrhagic conversion, and seizures.            Patient will receive Physical therapy, Occupational therapy, and Speech therapy each 60 minutes a day, 5 days a week for a total of 3 hours daily (minimum) for the duration of the rehabilitation stay within an interdisciplinary rehabilitation program with case manager/social worker, dietician, neuropsychologist, rehab nursing and PM&R oversight.     Rehabilitation Prognosis: Fair to good  Medical Prognosis: The patient is deemed medically stable to tolerate, benefit from, and participate in IRF level services.  Tolerance for three hours of therapy a day: Good. Patient has participated well with therapies since 10-18-2023 in the acute care setting and is anticipated to tolerate therapy as required.      Goals/Barriers/Facilitators  Family / Patient Goals: return home with family assistance  Mobility Goals: Overall goal is Supervision or touching assistance and Physical Therapy will evaluate and treat ambulation/wheelchair use and bed transfers  Activities of Daily Living (ADLs) Goals: Overall goal is Supervision or touching assistance and Occupational therapy will evaluate and treat basic Activities of Daily Living  Cognition / Communication Goals: Overall goal is Supervision or touching assistance and Speech therapy will evaluate and treat cognition and communication deficits and assess for safe swallow         Barriers & Interventions:   Caregiver Apprehension: Arrange caregiver support and discuss barriers and patient progress with caregivers when appropriate.  High Burden of Care: Initiate interdisciplinary rehabilitation to improve functional independence and reduce burden of care.  Medication Education:  Pharmacist and nursing staff to provide education to patient and family regarding medication side effects, special precautions, and safe administration.  Facilitators: good home setup, good family / social support, patient motivation, improving strength / endurance, and improving medical condition     Discharge Planning  Expected Length of Stay  21  day(s)  Expected Discharge Disposition Home          Alyson Jump, RN, BSN

## 2023-10-25 NOTE — Progress Notes
 Vascular Neurology Progress Note      Kevin Obrien  Admission Date: 10/17/2023  LOS: 8 days     Assessment/Plan:  Principal Problem:    Ischemic stroke (CMS-HCC)  Active Problems:    Diabetes mellitus (CMS-HCC)    Neuropathy    Peripheral artery disease    Essential tremor    Poorly controlled diabetes mellitus (CMS-HCC)    Gaze preference    Moderate malnutrition      Kevin Obrien is a 73 y.o. male with hypertension, diabetes and prior CAD s/p PCI (~10 years ago) presented with acute right facial droop, left gaze and dysarthria and was stroke activated. Initial NIH was 6, and the LKW was the night before. His BP at arrival was 210/85. CT head was negative for acute changes, CTA H&N showed no LVO but significant calcified athero and diffuse intracranial vascular stenoses.  He did not receive TNK due to LKW time and no EVT due to no LVO.  He was loaded with Aspirin and Plavix  with plan for DAPT for 3 months. A1c was 11.7%, endocrine consulted.  ECHO showed a PFO with shunt, but ultrasound of the lower extremities was without DVT. MRI brain showed a right sided pontomedullary brainstem infarct.  In light of the results of the MRI, all his clinical symptoms (dysphagia, facial weakness, gaze deviation) can be explained with the stroke location. He still has corpak, will work with SLP for swallow recs.  Pending d/c to [facility] when medically stable.      Neuro exam 10/25/23  Bilateral gaze Palsy, L facial droop, dysarthria, L UE 4/5, 5/5 throughout elsewhere      Acute R pontomedullary infarct  Hx of CAD s/p PCI  Diffuse intracranial athero  Right sided facial weakness (upper and lower)  Forced left gaze deviation  Dysphagia s/p PEG tube placement on 5/15  LUE Weakness  Pseudobulbar Affect    NeuroImaging  CT Head  1.  No acute intracranial hemorrhage or mass effect.   2.  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   3.  Additional cerebral hypodensities, likely secondary to chronic microvascular ischemic changes.   CTA head  1.  Multifocal stenosis of the anterior, middle, and posterior cerebral arteries most likely secondary to significant intracranial atherosclerosis. No large vessel central occlusion is identified   2.  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   CTA neck  1.  Bilateral atherosclerotic plaque at the carotid bulbs and origins of the ICAs without significant/high-grade stenosis of the ICAs by NASCET criteria.   2. 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   3. 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   MRI Brain  1.  Small acute right pontomedullary brainstem infarct   2.  Stable old left basal ganglia infarct and old left thalamic and right supratentorial white matter lacunar infarcts.   3.  Stable generalized cerebral volume loss and moderate patchy supratentorial white matter FLAIR hyperintensities, likely due to chronic microvascular ischemia.   4.  Intracranial right vertebral stenotic-occlusive disease, better assessed on CTA.     Stroke work up  A1c:  11.7  Fasting Lipid Profile: TG 219, Chol 216, LDL 161  TTE: EF of  65%, bubble study shows a Right  to Left Shunting consistent with a PFO, no thrombus/valvular vegetation, LA index WNL  P2Y12 138 - hence he is a Plavix responder  Hypercoagulable work up: not indicated      Stroke Etiology - large artery athero  On 5/14, patient noted to have a new left gaze palsy in addition to the original R gaze palsy, and more pronounced LUE weakness - given the location of stroke on MRI, it is likely related to evolution of the stroke, and stuttering symptoms as it is lacunar stroke.      PLAN   > Continue DAPT with aspirin 81 mg daily and clopidogrel 75 mg daily for 90 days followed by aspirin 81 mg indefinitely for secondary stroke prevention.  > PT OT recommending rehab  > Continue Crestor 40 mg daily per cardiology recs  > Consider starting Dextromethorphan-Quinidine in the outpatient setting (unfortunately unavailble on the formulary inpatient) for Pseudobulbar affect, will start Fluoxetine 20mg  in the meantime  > Neurology clinic follow up on dc    Aspiration pneumonia  Acute Hypoxemic Respiratory Failure - resolved  Increasing oxygen requirements overnight on 5/13, tachycardic, leukocytosis , elevated procal  > Repeat CXR 5/13 showed mild basal opacities, favored to represent atelectasis  > Received 1 dose vancomycin 1,250 mg 5/13 and Zosyn for 3 days (5/13-5/16)  > Switched from IV Abx Zosyn to oral Abx Augmentin - will complete a total course of 7 days of Abx (Augmentin to end on 5/19 after completion)  > Sputum cultured - NGTD      Hypertension  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 Hence, will hold off on further adjustments at this time and resume his oral meds for now.   > Continue amlodipine 10 mg daily, lisinopril 5 mg daily, metoprolol 25mg  BID  > Cardiology consulted for LVOT obstruction  > Outpatient 30-day continuous cardiac telemetry monitor upon discharge  > Crestor 40 mg daily  > Close follow up with outpatient cardiology    Diabetes mellitus  > Endocrine following -on 10/23/2023-Continue insulin glargine 18u every day   Increase regular insulin to 23u q6h. Hold if tube feeds are held  Continue correction factor q6h\  > Appreciate Endocrine recs for dc regimen     Dysphagia  S/p PEG tube placement  PEG tube placed on 5/15 - okay to use PEG tube for meds after 12 hours after placement, and then resume tube feeds for 24 hours after the PEG tube placement  Patient does not have a corpak in place as it is causing him a lot of discomfort, hence until the PEG tube can be used, only IV medications.  > Start Tube feeds after 24 hours of PEG tube placement    LVOT Obstruction  PFO  > Metoprolol 12.5mg  BID - will resume oral dosing on 5/16      DVT Prophylaxis: Lovenox 40mg  qday   Anticoagulation: none  PT/OT recs: [facility]  Diet: TF via PEG tube  Dispo: likely on 5/17 depending on clinical status  Code: Full      Patient seen and discussed with Dr. Alonso Jan who helped formulate the assessment and plan.        Kevin Obrien, MBBS  Neurology, PGY4  Available on Voalte      _______________________________________________________________________    Subjective:  Daughter at bedside - pt off  nasal canula oxygen, doing well on room air. Discussed starting a med for mood      Objective:                  Vital Signs:  Last Filed                   Vital Signs: 24 Hour Range   BP: 148/61 (05/16 0525)  Temp: 36.9 ?C (98.5 ?F) (05/16 4540)  Pulse: 84 (05/16 0525)  Respirations: 18 PER MINUTE (05/16 0525)  SpO2: 96 % (05/16 0525)  O2 Device: None (Room air) (05/16 0525)  O2 Liter Flow: 2 Lpm (05/15 1130)  BP: (114-174)/(59-98)   Temp:  [36.3 ?C (97.3 ?F)-37.3 ?C (99.1 ?F)]   Pulse:  [73-103]   Respirations:  [10 PER MINUTE-20 PER MINUTE]   SpO2:  [95 %-99 %]   O2 Device: None (Room air)  O2 Liter Flow: 2 Lpm    Intensity Pain Scale (Self Report): (not recorded)    Intake/Output Summary: (Last 24 hours)    Intake/Output Summary (Last 24 hours) at 10/25/2023 0735  Last data filed at 10/25/2023 9811  Gross per 24 hour   Intake 1838.75 ml   Output 2525 ml   Net -686.25 ml         Medications:  Scheduled Meds:amLODIPine (NORVASC) tablet 10 mg, 10 mg, Per NG tube, QDAY  aspirin chewable tablet 81 mg, 81 mg, Per NG tube, QDAY  clopiDOGreL (PLAVIX) tablet 75 mg, 75 mg, Per NG tube, QDAY  enoxaparin (LOVENOX) syringe 40 mg, 40 mg, Subcutaneous, QDAY(21)  insulin aspart (U-100) (NOVOLOG FLEXPEN U-100 INSULIN) injection PEN 0-12 Units, 0-12 Units, Subcutaneous, Q6H  insulin glargine (LANTUS SOLOSTAR U-100 INSULIN) injection PEN 18 Units, 18 Units, Subcutaneous, QDAY  insulin regular human (NOVOLIN R FLEXPEN) injection PEN 23 Units, 23 Units, Subcutaneous, Q6H  lisinopriL (ZESTRIL) tablet 5 mg, 5 mg, Feeding Tube, QDAY  metoprolol tartrate tablet 25 mg, 25 mg, Feeding Tube, BID  piperacillin/tazobactam (ZOSYN) 4.5 g in sodium chloride 0.9% (NS) 100 mL IVPB (MB+), 4.5 g, Intravenous, Q6H*  potassium chloride (K-DUR) tablet 30 mEq, 30 mEq, Oral, ONCE  [Held by Provider] Protein Supplement Packets, , SEE ADMIN INSTRUCTIONS, QDAY  rosuvastatin (CRESTOR) tablet 40 mg, 40 mg, Feeding Tube, QDAY  thiamine mononitrate (vit B1) tablet 100 mg, 100 mg, Per NG tube, QDAY    Continuous Infusions:   Diet Enteral Feeding Standard Infusion Stopped (10/23/23 1500)    lactated ringers infusion 75 mL/hr at 10/25/23 0105     PRN and Respiratory Meds:acetaminophen (OFIRMEV) IV Q8H PRN, acetaminophen Q4H PRN **OR** acetaminophen Q4H PRN, dextrose 50% (D50) IV PRN, diclofenac sodium QID PRN, flumazeniL PRN, lidocaine hcl PRN, nalOXone PRN, pancrelipase 20,880 Units/sodium bicarbonate 650 mg ([place] CLOG DESTROYER) PRN (On Call from Rx), phenoL PRN       Neuro exam:   Mental status: Drowsy but opens eyes when spoken to  Speech: Dysarthria   Cranial Nerves:    Normal Abnormal   II Pupils reactive, visual fields normal    III, IV, VI  Patient does not move eyes to the left or right, vertical eye movements intact   V Sensation nml V1-V3    VII     VIII nml to conversation    IX, X +strong cough    XI Equal shoulder shrug    XII Tongue midline    Muscle/motor: 5/5 in RUE and RLE, 5/5 in LLE, 4/5 in LUE  Sensation: Normal  in bilateral upper and lower extremities, no abnormalities   Coordination: Not assessed due to patient fatigue  Gait and Sation: Not assessed    Laboratory Review:  No results found for: PHART, PCO2A, PO2ART, HCO3A, BASEEXA, BASEDEFA, O2SATACAL  Lab Results   Component Value Date    HGB 12.6 (L) 10/25/2023    HCT 37.8 (L) 10/25/2023    PLTCT 220 10/25/2023    WBC 7.60 10/25/2023    NEUT 82.5 (H) 10/22/2023    ANC 10.90 (H) 10/22/2023     Lab Results   Component Value Date    NA 137 10/25/2023    K 3.7 10/25/2023    CL 104 10/25/2023    CO2 22 10/25/2023    GAP 11 10/25/2023    BUN 12 10/25/2023    CR 0.73 10/25/2023    GLU 128 (H) 10/25/2023    CA 9.0 10/25/2023    MG 1.8 10/21/2023    LACTIC 0.9 10/24/2023    TOTPROT 7.3 10/22/2023    AST 19 10/22/2023    ALT 21 10/22/2023    ALKPHOS 71 10/22/2023     Lab Results   Component Value Date    TNI 0.00 02/22/2019     Lab Results   Component Value Date    PTT 27.6 10/17/2023    INR 1.0 (L) 10/17/2023    DIMER 254 02/22/2019     Lab Results   Component Value Date    TSH 0.40 06/05/2023     Lab Results   Component Value Date    CHOL 216 (H) 10/17/2023    TRIG 219 (H) 10/17/2023    HDL 36 (L) 10/17/2023    LDL 161 (H) 10/17/2023    VLDL 43.8 10/17/2023     No results found for: VANPK, VANTR  No results found for: CYCLOSPOR, TACROLIMUS, FREEPHENY     Point of Care Testing:  (Last 24 hours):  FSBS (Manual): (!) 291  Glucose: (!) 128  POC Glucose (Download): (!) 136    Radiology and Other Diagnostics Reviewed      Gustavus Leisure, MBBS   Vascular Neurology

## 2023-10-25 NOTE — Procedures
 Interventional Radiology Quick Consult Note    G-tube able to be unclogged at bedside. The lumen is flushed now functioning appropriately. OK for use as directed.     Recommend a vigorous/turbulent 20cc warm tapwater flush before and after each use to help prevent future clogging. We'll sign off at this time. Reconsult as needed.    Andris Banning, APRN-NP  Pgr [phone]    Thank you for allowing us  to participate in the care of this patient. Please call with questions or concerns. If calling after hours or over the weekend please page the IR resident on call @ [phone].

## 2023-10-25 NOTE — Progress Notes
 Endocrinology Note   Name:  Kevin Obrien MRN:  [id number] Admission Date:  10/17/2023    Principal Problem:    Ischemic stroke (CMS-HCC)  Active Problems:    Diabetes mellitus (CMS-HCC)    Neuropathy    Peripheral artery disease    Essential tremor    Poorly controlled diabetes mellitus (CMS-HCC)    Gaze preference    Moderate malnutrition      Reason for Consult:     10/19/23 1048  CONSULT ENDOCRINOLOGY PHYSICIAN  ONCE        Priority: Routine   Ordering Provider: Vannie Gentile, APRN-NP      Provider: (Not yet assigned)   Question Answer Comment   Reason for consult high A1C, no management    Is this consult for diabetes management? Yes    Consult Type: Co-Management w/Signed Orders    Ordering Provider's or Responsible Teams's Phone/Pager number? Number not on file              Assessment / Plan     Admission date and time: 10/17/2023  8:24 AM     Type 2 Diabetes Mellitus uncontrolled  Insulin dose changed  Non adherance to meds  Acute stroke   Dyslipidemia   Tube feed hyperglycemia   A1c 11.7, 10/17/2023   PTA regimen: non compliant, was supposed to be on meal time insulin. Was taking very sporadically   Was not taking any PO meds either   Hypoglycemic episodes on this regimen: none   Follows up with ? PCP for diabetes management  Diabetic-complications assessment:  CAD, stroke, neuropathy     Aspiration pneumonia    Current diet: NPO, Jevity 1.5 TF at goal 45 ml/hr - uptitrating with recent PEG  Glucocorticoids: none     Glucose values over the past 24 hours:    Estimated Creatinine Clearance: 83.8 mL/min (based on SCr of 0.73 mg/dL).    Recent Labs     10/23/23  2355 10/24/23  0554 10/24/23  1217 10/24/23  1635 10/24/23  2156 10/25/23  0011 10/25/23  0344 10/25/23  0933   GLUPOC 131* 147* 205* 170* 153* 146* 136* 131*       Diabetes Regimen Changes today based on glucose values:    Continue insulin glargine 18u every day   Hold regular insulin as tube feeds are paused; will follow along and resume regular insulin depending on how feeds are tolerated.   Continue correction factor q6h    Goal BG 140-180.     Diabetes Plans Post Discharge  Diabetes regimen: TBD  He will most likely need insulin on discharge.   We will request for outpatient follow-up. Family would like to come to [facility]. He used to see [facility] and last visit was in 2020    Patient discussed with Dr. Zafar.     Neomi Banks, MD  PGY-3, Internal Medicine       Subjective:      Patient not interviewed this morning, but chart reviewed.     Past Medical History:    Accidental fall    Angina pectoris    Arthritis    BPH (benign prostatic hyperplasia)    Cataract    Constipation    Coronary artery disease    DM (diabetes mellitus) (CMS-HCC)    ED (erectile dysfunction)    Heart attack (CMS-HCC)    Hyperlipidemia    Hypertension    Joint pain    Neuropathy    Shingles  Surgical History:   Procedure Laterality Date    CORONARY STENT PLACEMENT  2016    HX CHOLECYSTECTOMY  09/2015    COLONOSCOPY N/A 04/20/2016    Performed by Karalee Oscar, MD at [facility] ENDO    ESOPHAGOGASTRODUODENOSCOPY N/A 04/20/2016    Performed by Karalee Oscar, MD at [facility] ENDO    ESOPHAGOGASTRODUODENOSCOPY BIOPSY  04/20/2016    Performed by Karalee Oscar, MD at [facility] ENDO    UREA BREATH TEST N/A 11/13/2016    Performed by Dearl Fabry, MD at [facility] ENDO     Family history reviewed; non-contributory  Social History     Tobacco Use    Smoking status: Former     Current packs/day: 0.00     Average packs/day: 2.0 packs/day for 10.0 years (20.0 ttl pk-yrs)     Types: Cigarettes     Start date: 04/11/1961     Quit date: 04/12/1971     Years since quitting: 52.5    Smokeless tobacco: Never    Tobacco comments:     Former smoker/quit years ago   Vaping Use    Vaping status: Never Used   Substance and Sexual Activity    Alcohol use: Not Currently    Drug use: Never    Sexual activity: Not Currently     Partners: Female     Birth control/protection: None          Vital Signs: Last Filed In 24 Hours Vital Signs: 24 Hour Range   BP: 169/69 (05/16 0858)  Temp: 36.9 ?C (98.4 ?F) (05/16 1478)  Pulse: 86 (05/16 0858)  Respirations: 18 PER MINUTE (05/16 0858)  SpO2: 98 % (05/16 0858)  O2 Device: None (Room air) (05/16 0858)  O2 Liter Flow: 2 Lpm (05/15 1130) BP: (114-174)/(59-81)   Temp:  [36.3 ?C (97.3 ?F)-37.3 ?C (99.1 ?F)]   Pulse:  [78-101]   Respirations:  [10 PER MINUTE-19 PER MINUTE]   SpO2:  [96 %-99 %]   O2 Device: None (Room air)  O2 Liter Flow: 2 Lpm            Medications:  No current facility-administered medications on file prior to encounter.     No current outpatient medications on file prior to encounter.           Lab Results   Component Value Date    URMALBCRRAT 285.85 (H) 05/06/2017    TSH 0.40 06/05/2023       Chemistry and Blood Count Laboratory Flow sheet  Comprehensive Metabolic Profile        Latest Ref Rng & Units 10/22/2023     5:01 AM 10/22/2023     7:01 PM 10/23/2023     5:55 AM 10/24/2023     5:06 AM 10/25/2023     5:39 AM   CMP   Sodium 137 - 147 mmol/L 136  135  137  135  137    Potassium 3.5 - 5.1 mmol/L 3.8  3.9  3.6  3.9  3.7    Chloride 98 - 110 mmol/L 101  99  102  103  104    CO2 21 - 30 mmol/L 25  23  23  23  22     Anion Gap 3 - 12 10  13  12  9  11     Blood Urea Nitrogen 7 - 25 mg/dL 17  24  18  14  12     Creatinine 0.40 - 1.24 mg/dL 2.95  6.21  3.08  6.57  0.73    Glucose 70 - 100 mg/dL 166  063  016  010  932    Calcium 8.5 - 10.6 mg/dL 8.9  9.8  9.2  9.1  9.0    Total Protein 6.0 - 8.0 g/dL  7.3       Albumin 3.5 - 5.0 g/dL  4.0       Alk Phosphatase 25 - 110 U/L  71       ALT (SGPT) 7 - 56 U/L  21       AST 7 - 40 U/L  19       Total Bilirubin 0.2 - 1.3 mg/dL  0.8       GFR >35 mL/min >60  >60  >60  >60  >60         Complete Blood Count with Differential       Latest Ref Rng & Units 10/22/2023     5:01 AM 10/22/2023     7:01 PM 10/23/2023     5:55 AM 10/24/2023     5:06 AM 10/25/2023     5:39 AM   CBC with Diff   WBC 4.50 - 11.00 10*3/uL 11.70  13.20  14.90  9.70  7.60 RBC 4.40 - 5.50 10*6/uL 4.67  4.76  4.30  4.29  4.30    Hemoglobin 13.5 - 16.5 g/dL 57.3  22.0  25.4  27.0  12.6    Hematocrit 40.0 - 50.0 % 40.9  42.1  37.7  38.4  37.8    MCV 80.0 - 100.0 fL 87.6  88.4  87.6  89.5  88.0    MCH 26.0 - 34.0 pg 30.1  30.9  29.7  29.3  29.4    MCHC 32.0 - 36.0 g/dL 62.3  76.2  83.1  51.7  33.4    RDW 11.0 - 15.0 % 12.7  12.9  12.6  12.7  12.6    Platelet Count 150 - 400 10*3/uL 207  223  192  194  220    MPV 7.0 - 11.0 fL 9.6  9.6  9.8  9.6  9.5    Neurtrophils 41.0 - 77.0 %  82.5       Absolute Neutrophils 1.80 - 7.00 10*3/uL  10.90       Absolute Lymph Count 1.00 - 4.80 10*3/uL  1.50       Absolute Monocyte Count 0.00 - 0.80 10*3/uL  0.70       Eosinophils 0.0 - 5.0 %  0.2       Absolute Eosinophil Count 0.00 - 0.45 10*3/uL  0.00       Basophils 0.0 - 2.0 %  0.3            Most recent Lipid Profile Lipid Profile trend   Lab Results   Component Value Date    CHOL 216 (H) 10/17/2023    TRIG 219 (H) 10/17/2023    HDL 36 (L) 10/17/2023    LDL 161 (H) 10/17/2023    VLDL 43.8 10/17/2023    NONHDLCHOL 180 10/17/2023    Lab Results   Component Value Date    CHOL 216 (H) 10/17/2023    CHOL 184 06/05/2023    CHOL 159 09/01/2015    TRIG 219 (H) 10/17/2023    TRIG 207 (H) 06/05/2023    TRIG 299 (H) 09/01/2015    HDL 36 (L) 10/17/2023    HDL 27 (L) 06/05/2023    HDL 27 (L) 09/01/2015    LDL  161 (H) 10/17/2023    LDL 119 (H) 06/05/2023    LDL 84 09/01/2015    VLDL 57.8 10/17/2023    VLDL 41.4 06/05/2023    VLDL 60 09/01/2015    NONHDLCHOL 180 10/17/2023    NONHDLCHOL 157 06/05/2023    NONHDLCHOL 132 09/01/2015          Lab Results   Component Value Date    CHOL 216 (H) 10/17/2023    TRIG 219 (H) 10/17/2023    HDL 36 (L) 10/17/2023    LDL 161 (H) 10/17/2023    VLDL 43.8 10/17/2023    NONHDLCHOL 180 10/17/2023          Lab Results   Component Value Date    A1C 12.2 05/01/2018    A1C 12.2 05/06/2017    A1C 11.9 11/12/2016    HGBA1C 11.7 (H) 10/17/2023    HGBA1C 11.9 (H) 06/05/2023    HGBA1C 13.6 (H) 12/04/2017         BMI Readings from Last 8 Encounters:   10/17/23 24.46 kg/m?   08/30/23 22.38 kg/m?   06/06/23 22.96 kg/m?   03/06/23 21.79 kg/m?   01/29/22 23.34 kg/m?   01/08/22 23.34 kg/m?   08/02/21 23.42 kg/m?   11/13/20 21.63 kg/m?       Wt Readings from Last 8 Encounters:   10/17/23 66.7 kg (147 lb)   08/30/23 61 kg (134 lb 7.7 oz)   06/06/23 62.6 kg (138 lb)   03/06/23 61.2 kg (135 lb)   01/29/22 67.6 kg (149 lb)   01/08/22 67.6 kg (149 lb)   08/02/21 67.8 kg (149 lb 8 oz)   11/13/20 59 kg (130 lb)

## 2023-10-25 NOTE — Progress Notes
 PHYSICAL THERAPY  PROGRESS NOTE          Name: Kevin Obrien   MRN: [id number]     DOB: [birth year]      Age: 73 y.o.  Admission Date: 10/17/2023     LOS: 8 days     Date of Service: 10/25/2023        Mobility  Mobility Level [facility] Highest Level of Mobility (JH-HLM): Move to Chair/Commode  Level of Assistance: Assist X2  Assistive Device: Hand Held  Activity Limited By: Mental Status Variability;Lines / Medical Devices;Weakness    Subjective  Significant Hospital Events: 73 y.o. male with hypertension, diabetes and prior CAD s/p PCI (~10 years ago) presented with acute right facial droop, left gaze and dysarthria.  Special Considerations: Feeding tube present (PEG tube 5/15)  Mental / Cognitive: Alert;Cooperative;Follows commands  Persons Present: Lobbyist    Home Living Situation  Lives With: Family  Type of Home: House  Entry Stairs: 2  In-Home Stairs: Able to live on main level  Bathroom Setup: Tub/shower unit  Patient Owned Equipment: Cane: Single point  Comments: Pt denies prior use of DME.    Prior Level of Function  Level Of Independence: Independent with ADL and household mobility with device  Comments: intermittent use of SPC per daughter  History of Falls in Past 3 Months: Yes    Bed Mobility/Transfer  Bed Mobility: Supine to Sit: Moderate Assist;Assist with Trunk;Assist with B LE;Head of Bed Elevated;No Rail  Transfer Type: Sit to/from [name]: Assistance Level: From;Bed;To;Commode;Minimal Assist;x2 People  Transfer: Assistive Device: Doctor, general practice  Transfers: Type Of Assistance: Verbal Cues;For Balance;For Strength Deficit;For Safety Considerations;Requires Extra Time  Other Transfer Type: Mechanical Lift  Other Transfer: Assistance Level: From;Commode;To;Bed Side Chair;Moderate Assist;x2 People  Other Transfer: Assistive Device: Management consultant Abraham Hoffmann stedy)  Other Transfer: Type Of Assistance: Verbal Cues;For Balance;For Strength Deficit;For Safety Considerations;Requires Extra Time  End Of Activity Status: Up in Chair;Nursing Notified;Instructed Patient to Request Assist with Mobility;Instructed Patient to Use Call Light (alarm activated)  Comments: Pt assited to commode per pt request for BM; total assist for hygiene. Abraham Hoffmann stedy trialed to transfer back to chair. Pt requires increased assist for lateral stability and sequencing with use of Sara stedy. Continued use of Hoyer recommended for transfers with nursing staff.    Gait  Comments: Deferred this session due to time constraints after pt uses commode.    Assessment/Progress  Impaired Mobility Due To: Impaired Balance;Decreased Activity Tolerance;Decreased Strength  Impaired Strength Due To: Medical Status Limitation;Decreased Activity Tolerance  Assessment/Progress: Should Improve w/ Continued PT    AM-PAC 6 Clicks Basic Mobility Inpatient  Turning from your back to your side while in a flat bed without using bed rails: A lot  Moving from lying on your back to sitting on the side of a flat bed without using bedrails : Total  Moving to and from a bed to a chair (including a wheelchair): Total  Standing up from a chair using your arms (e.g. wheelchair, or bedside chair): Total  To walk in hospital room: Total  Climbing 3-5 steps with a railing: Total  Basic Mobility Inpatient Raw Score: 7  Standardized (T-scale) Score: 19.39  Mobility Goal [facility]: JH-HLM 2 Bed Activities/Dependent Transfers    Goals  Goal Formulation: With Patient/Family  Time For Goal Achievement: 5 days  Patient Will Go Supine To/From Sit: w/ Stand By Assist  Patient Will Transfer Sit to Stand: w/ Stand By Assist  Patient Will Ambulate: 51-100 Feet, w/ Cane, w/ Stand By Assist  Patient Will Go Up / Down Stairs: 1-2 Stairs, w/ Stand By Assist    Plan  Treatment Interventions: Mobility training;Balance activities  Plan Frequency: 5-7 Days per Week  PT Plan for Next Visit: Work on sitting and standing balance;transfers, gait with Ax2 HHA or at rail    PT Discharge Recommendations  Recommendation: Inpatient setting;Recommend rehab medicine consult    Therapist: Maricela Shoe, PTA  Date: 10/25/2023

## 2023-10-25 NOTE — Progress Notes
 Interventional Radiology Post-G-tube Follow Up Note      Tube/site assessment: Tube intact, dried blood under disc not unexpected considering AC regimen.     Plan:    - G-tube is ok for use as directed at this time.   - No follow up is needed with IR.    General care recs below:    1. Please keep a very strict NPO for the first 12 hours after placement with the tube connected to dependent drainage. After 12 hours the drainage bag can be removed and clear liquid challenges (20-44ml) can begin by mouth and/or through the tube. If tolerating, a clear liquid diet can be ordered and oral medications administered. At 24 hours after placement the tube is OK for normal use.     2. Flush g-tube with 50ml of water after each feeding.    3. Initial dressing can be removed in 48 hours at which time showering may be resumed.    4. T-fasteners (the three buttons below the disc) should fall off on their own in about 10 days. If still present after 10 days, please page the IR resident on call at [phone] to discuss.    5. If NPO and using the tube for draining purposes only, or not using the tube for stretches of time, flush tube twice daily to maintain patency.     6. We recommend cleaning the skin with soap and water only, allowing it to dry completely, and ensuring that the balloon and disc are both taut to the skin so that the tube cannot freely move in and out of the tract. If breakdown is noted, barrier creams should only be applied after skin has been completely cleaned and completely dried. We do not recommend dressings under the disc as this only serves to force the disc further from the abdominal wall, creating a tract for easier leakage to occur.     IR will sign off at this time. Re-consult as needed.    Andris Banning, APRN-NP  [facility]  Pgr [phone]    Thank you for allowing us  to participate in the care of this patient. Please call with questions or concerns. If calling after hours or over the weekend please page the IR resident on call @ [phone].

## 2023-10-25 NOTE — Case Management (ED)
 Case Management Progress Note    NAME:Kevin Obrien                          MRN: [id number]              DOB:1950-10-20          AGE: 73 y.o.  ADMISSION DATE: 10/17/2023             DAYS ADMITTED: LOS: 8 days      Today's Date: 10/25/2023    PLAN: Anticipate dc to [facility], pending insurance auth.    Expected Discharge Date: 10/25/2023   Is Patient Medically Stable: Yes   Are there Barriers to Discharge? no    INTERVENTION/DISPOSITION:  Discharge Planning                 SW reviewed EMR and met with NCM and Stroke team for huddle to discuss plan of care. Pt is starting tube feeds today and stable for dc.    SW updated [facility]. [facility] is pending.    SW will continue to remain available and assist with discharge needs as they arise.    Transportation              Does the Patient Need Case Management to Arrange Discharge Transport? (ex: facility, ambulance, wheelchair/stretcher, Medicaid, cab, other): No  Will the Patient Use Family Transport?: Yes  Transportation Name, Phone and Availability #1: pt's daughters  Support                 Info or Referral                 Positive SDOH Domains and Potential Barriers                   Medication Needs                                                                                                                                                         Financial                 Legal                 Other                 Discharge Disposition  Dorisann Garre    Dorisann Garre, [place]  Inpatient Social Work Case Manager  Neurology General and Stroke  Available via [facility]  Cell: [phone]

## 2023-10-26 ENCOUNTER — Inpatient Hospital Stay: Admit: 2023-10-26 | Discharge: 2023-10-26 | Payer: MEDICARE

## 2023-10-26 ENCOUNTER — Encounter: Admit: 2023-10-26 | Discharge: 2023-10-26 | Payer: MEDICARE

## 2023-10-26 LAB — POC GLUCOSE
~~LOC~~ BKR POC GLUCOSE: 225 mg/dL — ABNORMAL HIGH (ref 70–100)
~~LOC~~ BKR POC GLUCOSE: 233 mg/dL — ABNORMAL HIGH (ref 70–100)
~~LOC~~ BKR POC GLUCOSE: 266 mg/dL — ABNORMAL HIGH (ref 70–100)
~~LOC~~ BKR POC GLUCOSE: 308 mg/dL — ABNORMAL HIGH (ref 70–100)
~~LOC~~ BKR POC GLUCOSE: 324 mg/dL — ABNORMAL HIGH (ref 70–100)

## 2023-10-26 MED ORDER — LISINOPRIL 10 MG PO TAB
15 mg | Freq: Once | ORAL | 0 refills | Status: CP
Start: 2023-10-26 — End: ?
  Administered 2023-10-26: 16:00:00 15 mg via ORAL

## 2023-10-26 MED ORDER — LISINOPRIL 20 MG PO TAB
20 mg | Freq: Every day | GASTROSTOMY | 0 refills | Status: DC
Start: 2023-10-26 — End: 2023-10-28
  Administered 2023-10-27 – 2023-10-28 (×2): 20 mg via GASTROSTOMY

## 2023-10-26 MED ORDER — INSULIN REGULAR HUMAN 100 UNIT/ML (3 ML) SC INPN
20 [IU] | SUBCUTANEOUS | 0 refills | Status: DC
Start: 2023-10-26 — End: 2023-10-27
  Administered 2023-10-27: 05:00:00 20 [IU] via SUBCUTANEOUS

## 2023-10-26 MED ORDER — INSULIN ASPART 100 UNIT/ML SC FLEXPEN
6 [IU] | Freq: Once | SUBCUTANEOUS | 0 refills | Status: CP
Start: 2023-10-26 — End: ?

## 2023-10-26 NOTE — Progress Notes
 OCCUPATIONAL THERAPY  NOTE   Name: Kevin Obrien   MRN: [id number]     DOB: February 28, 1951      Age: 73 y.o.  Admission Date: 10/17/2023     LOS: 9 days     Date of Service: 10/26/2023      Patient declined to participate despite encouragement and education about the role and benefits of occupational therapy 2/2 fatigue. Daughter in room.  Occupational therapy will continue to follow and provide intervention as indicated.        Therapist: Cathay Clonts, OT  Date: 10/26/2023

## 2023-10-26 NOTE — Progress Notes
 Vascular Neurology Progress Note      Kevin Obrien  Admission Date: 10/17/2023  LOS: 9 days     Assessment/Plan:  Principal Problem:    Ischemic stroke (CMS-HCC)  Active Problems:    Diabetes mellitus (CMS-HCC)    Neuropathy    Peripheral artery disease    Essential tremor    Poorly controlled diabetes mellitus (CMS-HCC)    Gaze preference    Moderate malnutrition      Kevin Obrien is a 73 y.o. male with hypertension, diabetes and prior CAD s/p PCI (~10 years ago) presented with acute right facial droop, left gaze and dysarthria and was stroke activated. Initial NIH was 6, and the LKW was the night before. His BP at arrival was 210/85. CT head was negative for acute changes, CTA H&N showed no LVO but significant calcified athero and diffuse intracranial vascular stenoses.  He did not receive TNK due to LKW time and no EVT due to no LVO.  He was loaded with Aspirin and Plavix  with plan for DAPT for 3 months. A1c was 11.7%, endocrine consulted.  ECHO showed a PFO with shunt, but ultrasound of the lower extremities was without DVT. MRI brain showed a right sided pontomedullary brainstem infarct.  In light of the results of the MRI, all his clinical symptoms (dysphagia, facial weakness, gaze deviation) can be explained with the stroke location. He still has corpak, will work with SLP for swallow recs. Pending d/c to [facility] when medically stable.        Neuro exam 10/25/23  Bilateral gaze Palsy, L facial droop, dysarthria, L UE 4/5, 5/5 throughout elsewhere      Acute R pontomedullary infarct  Hx of CAD s/p PCI  Diffuse intracranial athero  Right sided facial weakness (upper and lower)  Forced left gaze deviation  Dysphagia s/p PEG tube placement on 5/15  LUE Weakness  Pseudobulbar Affect      NeuroImaging  CT Head  1.  No acute intracranial hemorrhage or mass effect.   2.  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   3.  Additional cerebral hypodensities, likely secondary to chronic microvascular ischemic changes.   CTA head  1.  Multifocal stenosis of the anterior, middle, and posterior cerebral arteries most likely secondary to significant intracranial atherosclerosis. No large vessel central occlusion is identified   2.  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   CTA neck  1.  Bilateral atherosclerotic plaque at the carotid bulbs and origins of the ICAs without significant/high-grade stenosis of the ICAs by NASCET criteria.   2. 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   3. 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   MRI Brain  1.  Small acute right pontomedullary brainstem infarct   2.  Stable old left basal ganglia infarct and old left thalamic and right supratentorial white matter lacunar infarcts.   3.  Stable generalized cerebral volume loss and moderate patchy supratentorial white matter FLAIR hyperintensities, likely due to chronic microvascular ischemia.   4.  Intracranial right vertebral stenotic-occlusive disease, better assessed on CTA.       Stroke work up  A1c:  11.7  Fasting Lipid Profile: TG 219, Chol 216, LDL 161  TTE: EF of  65%,  bubble study shows a Right to Left Shunting consistent with a PFO, no thrombus/valvular vegetation, LA index WNL  P2Y12 138 - hence he is a Plavix responder  Hypercoagulable work up: not indicated      Stroke Etiology - large artery athero  On 5/14, patient noted to have a new left gaze palsy in addition to the original R gaze palsy, and more pronounced LUE weakness - given the location of stroke on MRI, it is likely related to evolution of the stroke, and stuttering symptoms as it is lacunar stroke.      PLAN   > Continue DAPT with aspirin 81 mg daily and clopidogrel 75 mg daily for 90 days followed by aspirin 81 mg indefinitely for secondary stroke prevention.  > PT OT recommending rehab  > Continue Crestor 40 mg daily per cardiology recs  > Consider starting Dextromethorphan-Quinidine in the outpatient setting (unfortunately unavailble on the formulary inpatient) for Pseudobulbar affect, will start Fluoxetine 20mg  in the meantime  > Neurology clinic follow up on dc      Aspiration pneumonia  Acute Hypoxemic Respiratory Failure - resolved  Increasing oxygen requirements overnight on 5/13, tachycardic, leukocytosis , elevated procal  > Repeat CXR 5/13 showed mild basal opacities, favored to represent atelectasis  > Received 1 dose vancomycin 1,250 mg 5/13 and Zosyn for 3 days (5/13-5/16)  > Switched from IV Abx Zosyn to oral Abx Augmentin - will complete a total course of 7 days of Abx (Augmentin to end on 5/19 after completion)  > Sputum cultured - NGTD    Hypertension  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 Hence, will hold off on further adjustments at this time and resume his oral meds for now.   > Continue amlodipine 10 mg daily, increasing lisinopril to 20 mg daily, metoprolol 25mg  BID  > Cardiology consulted for LVOT obstruction  > Outpatient 30-day continuous cardiac telemetry monitor upon discharge  > Crestor 40 mg daily  > Close follow up with outpatient cardiology    Diabetes mellitus  > Endocrine following -on 10/23/2023-Continue insulin glargine 18u every day   Increase regular insulin to 23u q6h. Hold if tube feeds are held  Continue correction factor q6h  > Appreciate Endocrine recs for dc regimen     Dysphagia  S/p PEG tube placement  PEG tube placed on 5/15 - okay to use PEG tube for meds after 12 hours after placement, and then resume tube feeds for 24 hours after the PEG tube placement  Patient does not have a corpak in place as it is causing him a lot of discomfort, hence until the PEG tube can be used, only IV medications.  > Start Tube feeds after 24 hours of PEG tube placement    LVOT Obstruction  PFO  > Metoprolol 12.5mg  BID - will resume oral dosing on 5/16      DVT Prophylaxis: Lovenox 40mg  qday   Anticoagulation: none  PT/OT recs: [facility]  Diet: TF via PEG tube  Dispo: likely on 5/17 depending on clinical status  Code: Full      Patient seen and discussed with Dr. Alonso Jan who helped formulate the assessment and plan.      Providence Brow, MD  Neurology, PGY-3  Available on [employer]  _______________________________________________________________________    Subjective:  The patient was more sleepy  this morning and not truly interacting with the examiners (even with spanish translation). WE will obtain a STAT CT head.      Objective:                  Vital Signs:  Last Filed                   Vital Signs: 24 Hour Range   BP: 177/77 (05/17 0503)  Temp: 36.3 ?C (97.3 ?F) (05/17 0503)  Pulse: 97 (05/17 0503)  Respirations: 19 PER MINUTE (05/17 0503)  SpO2: 97 % (05/17 0503)  O2 Device: None (Room air) (05/17 0503)  BP: (161-177)/(69-77)   Temp:  [36.3 ?C (97.3 ?F)-37 ?C (98.6 ?F)]   Pulse:  [86-101]   Respirations:  [18 PER MINUTE-19 PER MINUTE]   SpO2:  [96 %-98 %]   O2 Device: None (Room air)    Intensity Pain Scale (Self Report): (not recorded)    Intake/Output Summary: (Last 24 hours)    Intake/Output Summary (Last 24 hours) at 10/26/2023 0738  Last data filed at 10/26/2023 0605  Gross per 24 hour   Intake 815 ml   Output 1550 ml   Net -735 ml         Medications:  Scheduled Meds:amLODIPine (NORVASC) tablet 10 mg, 10 mg, Per NG tube, QDAY  amoxicillin-potassium clavulanate (AUGMENTIN) tablet 875 mg, 875 mg, Feeding Tube, BID w/meals  aspirin chewable tablet 81 mg, 81 mg, Per NG tube, QDAY  clopiDOGreL (PLAVIX) tablet 75 mg, 75 mg, Per NG tube, QDAY  enoxaparin (LOVENOX) syringe 40 mg, 40 mg, Subcutaneous, QDAY(21)  insulin aspart (U-100) (NOVOLOG FLEXPEN U-100 INSULIN) injection PEN 0-12 Units, 0-12 Units, Subcutaneous, Q6H  insulin glargine (LANTUS SOLOSTAR U-100 INSULIN) injection PEN 18 Units, 18 Units, Subcutaneous, QDAY  [Held by Provider] insulin regular human (NOVOLIN R FLEXPEN) injection PEN 23 Units, 23 Units, Subcutaneous, Q6H  lisinopriL (ZESTRIL) tablet 5 mg, 5 mg, Feeding Tube, QDAY  metoprolol tartrate tablet 25 mg, 25 mg, Feeding Tube, BID  [Held by Provider] Protein Supplement Packets, , SEE ADMIN INSTRUCTIONS, QDAY  rosuvastatin (CRESTOR) tablet 40 mg, 40 mg, Feeding Tube, QDAY  sertraline (ZOLOFT) oral conc 50 mg, 50 mg, Feeding Tube, QDAY  thiamine mononitrate (vit B1) tablet 100 mg, 100 mg, Per NG tube, QDAY    Continuous Infusions:   Diet Enteral Feeding Standard Infusion 30 mL/hr at 10/26/23 0035     PRN and Respiratory Meds:acetaminophen (OFIRMEV) IV Q8H PRN, acetaminophen Q4H PRN **OR** acetaminophen Q4H PRN, dextrose 50% (D50) IV PRN, diclofenac sodium QID PRN, flumazeniL PRN, lidocaine hcl PRN, nalOXone PRN, pancrelipase 20,880 Units/sodium bicarbonate 650 mg ( CLOG DESTROYER) PRN (On Call from Rx), phenoL PRN       Neuro exam:   Mental status: Drowsy, opening eyes with sternal rub  Speech: Dysarthria   Cranial Nerves:    Normal Abnormal   II Pupils reactive, visual fields normal    III, IV, VI  Patient does not move eyes to the left or right, vertical eye movements intact   V Sensation nml V1-V3    VII     VIII nml to conversation    IX, X +strong cough    XI Equal shoulder shrug    XII Tongue midline    Muscle/motor: 5/5 in RUE and RLE, 5/5 in LLE, 4/5 in LUE  Sensation: Normal in bilateral upper and lower extremities, no abnormalities    Coordination: Not assessed due to patient fatigue  Gait and Sation: Not assessed    Laboratory Review:  No results found for: PHART, PCO2A, PO2ART, HCO3A, BASEEXA, BASEDEFA, O2SATACAL  Lab Results   Component Value Date    HGB 12.6 (L) 10/25/2023    HCT 37.8 (L) 10/25/2023    PLTCT 220 10/25/2023    WBC 7.60 10/25/2023    NEUT 82.5 (H) 10/22/2023    ANC 10.90 (H) 10/22/2023     Lab Results   Component Value Date    NA 137 10/25/2023    K 3.7 10/25/2023    CL 104 10/25/2023    CO2 22 10/25/2023    GAP 11 10/25/2023    BUN 12 10/25/2023    CR 0.73 10/25/2023    GLU 128 (H) 10/25/2023    CA 9.0 10/25/2023    MG 1.8 10/21/2023    LACTIC 0.9 10/24/2023    TOTPROT 7.3 10/22/2023    AST 19 10/22/2023    ALT 21 10/22/2023    ALKPHOS 71 10/22/2023     Lab Results   Component Value Date    TNI 0.00 02/22/2019     Lab Results   Component Value Date    PTT 27.6 10/17/2023    INR 1.0 (L) 10/17/2023    DIMER 254 02/22/2019     Lab Results   Component Value Date    TSH 0.40 06/05/2023     Lab Results   Component Value Date    CHOL 216 (H) 10/17/2023    TRIG 219 (H) 10/17/2023    HDL 36 (L) 10/17/2023    LDL 161 (H) 10/17/2023    VLDL 43.8 10/17/2023     No results found for: VANPK, VANTR  No results found for: CYCLOSPOR, TACROLIMUS, FREEPHENY     Point of Care Testing:  (Last 24 hours):  FSBS (Manual): (!) 291  Glucose: (!) 128  POC Glucose (Download): (!) 233    Radiology and Other Diagnostics Reviewed      Providence Brow, MD   Vascular Neurology

## 2023-10-26 NOTE — Progress Notes
 Endocrinology Note   Name:  Kevin Obrien MRN:  [id number] Admission Date:  10/17/2023    Principal Problem:    Ischemic stroke (CMS-HCC)  Active Problems:    Diabetes mellitus (CMS-HCC)    Neuropathy    Peripheral artery disease    Essential tremor    Poorly controlled diabetes mellitus (CMS-HCC)    Gaze preference    Moderate malnutrition      Reason for Consult:     10/19/23 1048  CONSULT ENDOCRINOLOGY PHYSICIAN  ONCE        Priority: Routine   Ordering Provider: Vannie Gentile, APRN-NP      Provider: (Not yet assigned)   Question Answer Comment   Reason for consult high A1C, no management    Is this consult for diabetes management? Yes    Consult Type: Co-Management w/Signed Orders    Ordering Provider's or Responsible Teams's Phone/Pager number? Number not on file              Assessment / Plan     Admission date and time: 10/17/2023  8:24 AM     Type 2 Diabetes Mellitus uncontrolled  Insulin dose changed  Non adherance to meds  Acute stroke   Dyslipidemia   Tube feed hyperglycemia   A1c 11.7, 10/17/2023   PTA regimen: non compliant, was supposed to be on meal time insulin. Was taking very sporadically   Was not taking any PO meds either   Hypoglycemic episodes on this regimen: none   Follows up with ? PCP for diabetes management  Diabetic-complications assessment:  CAD, stroke, neuropathy     Aspiration pneumonia    Current diet: Jevity 1.5 TF at goal 45 ml/hr   Glucocorticoids: none     Glucose values over the past 24 hours:    Estimated Creatinine Clearance: 87.3 mL/min (based on SCr of 0.65 mg/dL).    Recent Labs     10/24/23  2156 10/25/23  0011 10/25/23  0344 10/25/23  0933 10/25/23  1600 10/25/23  2146 10/26/23  0601 10/26/23  1219   GLUPOC 153* 146* 136* 131* 196* 225* 233* 324*       Diabetes Regimen Changes today based on glucose values:    Continue insulin glargine 18u every day   Resume regular insulin but will order 20 U every 6hrs as running at goal for tube feeding. Begin at 6 pm  Continue correction factor q6h  BG 266 at 3.25 pm. Give 6 units novolog once    Goal BG 140-180.     Diabetes Plans Post Discharge  Diabetes regimen: TBD  He will most likely need insulin on discharge.   We will request for outpatient follow-up. Family would like to come to [place] [facility]. He used to see [facility] and last visit was in 2020        Subjective:         Feeds running at goal    Vital Signs: Last Filed In 24 Hours Vital Signs: 24 Hour Range   BP: 117/52 (05/17 1312)  Temp: 36.4 ?C (97.5 ?F) (05/17 1312)  Pulse: 77 (05/17 1312)  Respirations: 18 PER MINUTE (05/17 1312)  SpO2: 97 % (05/17 1312)  O2 Device: None (Room air) (05/17 1312) BP: (117-177)/(52-85)   Temp:  [36.3 ?C (97.3 ?F)-37 ?C (98.6 ?F)]   Pulse:  [77-101]   Respirations:  [18 PER MINUTE-20 PER MINUTE]   SpO2:  [96 %-97 %]   O2 Device: None (Room air)  Peg tube feeds running  Patient is awake and alert  Breathing on room air      Current Facility-Administered Medications:     acetaminophen (OFIRMEV) 1,000 mg injection 100 mL, 1,000 mg, Intravenous, Q8H PRN, Nelly Banco, MD, Last Rate: 400 mL/hr at 10/25/23 0338, 1,000 mg at 10/25/23 8295    acetaminophen (TYLENOL) tablet 650 mg, 650 mg, PEG Tube, Q4H PRN, 650 mg at 10/25/23 2059 **OR** acetaminophen (TYLENOL) rectal suppository 650 mg, 650 mg, Rectal, Q4H PRN, Gerkovich, Kendal M, APRN-NP    amLODIPine (NORVASC) tablet 10 mg, 10 mg, Per NG tube, QDAY, Gerkovich, Kendal M, APRN-NP, 10 mg at 10/26/23 1029    amoxicillin-potassium clavulanate (AUGMENTIN) tablet 875 mg, 875 mg, Feeding Tube, BID w/meals, Lingam, Sivani, MBBS, 875 mg at 10/26/23 1029    aspirin chewable tablet 81 mg, 81 mg, Per NG tube, QDAY, Gerkovich, Kendal M, APRN-NP, 81 mg at 10/26/23 1029    clopiDOGreL (PLAVIX) tablet 75 mg, 75 mg, Per NG tube, QDAY, Gerkovich, Kendal M, APRN-NP, 75 mg at 10/26/23 1030    dextrose 50% (D50) syringe 25-50 mL, 12.5-25 g, Intravenous, PRN, Dominic Friendly M, APRN-NP    diclofenac sodium (VOLTAREN) 1 % topical gel 2 g, 2 g, Topical, QID PRN, Nelly Banco, MD, 2 g at 10/24/23 6213    Diet Enteral Feeding Standard Infusion, , SEE ADMIN INSTRUCTIONS, Continuous, Gerkovich, Kendal M, APRN-NP, Last Rate: 45 mL/hr at 10/26/23 1032, Rate Change at 10/26/23 1032    enoxaparin (LOVENOX) syringe 40 mg, 40 mg, Subcutaneous, QDAY(21), Shaker, Faris, MBChB, 40 mg at 10/24/23 2117    flumazeniL (ROMAZICON) injection 0.2 mg, 0.2 mg, Intravenous, PRN, Alanda Allegra P, APRN-NP    insulin aspart (U-100) (NOVOLOG FLEXPEN U-100 INSULIN) injection PEN 0-12 Units, 0-12 Units, Subcutaneous, Q6H, Niralya Ohanian, MD, 8 Units at 10/26/23 1245    insulin glargine (LANTUS SOLOSTAR U-100 INSULIN) injection PEN 18 Units, 18 Units, Subcutaneous, QDAY, Ticia Virgo, MD, 18 Units at 10/26/23 1246    insulin regular human (NOVOLIN R FLEXPEN) injection PEN 20 Units, 20 Units, Subcutaneous, Q6H, Yaphet Smethurst, MD    lidocaine hcl (XYLOCAINE) 2 % jelly (URO-JET), , Topical, PRN, Lingam, Sivani, MBBS, Given at 10/23/23 1545    [START ON 10/27/2023] lisinopriL (ZESTRIL) tablet 20 mg, 20 mg, Feeding Tube, QDAY, Camerucci, Emanuele, MD    metoprolol tartrate tablet 25 mg, 25 mg, Feeding Tube, BID, Gerkovich, Kendal M, APRN-NP, 25 mg at 10/26/23 1029    nalOXone (NARCAN) injection 0.08 mg, 0.08 mg, Intravenous, PRN, Andris Banning, APRN-NP    pancrelipase 20,880 Units/sodium bicarbonate 650 mg ([place] CLOG DESTROYER), , Feeding Tube, PRN (On Call from Rx), Seabron Cypress, Kendal M, APRN-NP, Given at 10/25/23 1344    phenoL (CHLORASEPTIC) spray 2 spray, 2 spray, Mouth/Throat, PRN, Seabron Cypress, Kendal M, APRN-NP, 2 spray at 10/19/23 2242    [Held by Provider] Protein Supplement Packets, , SEE ADMIN INSTRUCTIONS, QDAY, Vannie Gentile, APRN-NP, Given at 10/23/23 0858    rosuvastatin (CRESTOR) tablet 40 mg, 40 mg, Feeding Tube, QDAY, Gerkovich, Kendal M, APRN-NP, 40 mg at 10/26/23 1030    sertraline (ZOLOFT) oral conc 50 mg, 50 mg, Feeding Tube, QDAY, Lingam, Sivani, MBBS, 50 mg at 10/26/23 1029    thiamine mononitrate (vit B1) tablet 100 mg, 100 mg, Per NG tube, QDAY, Gerkovich, Kendal M, APRN-NP, 100 mg at 10/26/23 1029

## 2023-10-26 NOTE — Care Plan
 Problem: Discharge Planning  Goal: Participation in plan of care  Outcome: Goal Ongoing  Goal: Knowledge regarding plan of care  Outcome: Goal Ongoing  Goal: Prepared for discharge  Outcome: Goal Ongoing     Problem: Neurological Status, Impaired/Altered  Goal: Progress toward maximizing functional outcomes  Outcome: Goal Ongoing  Goal: Cognitive status restored to baseline  Outcome: Goal Ongoing     Problem: High Fall Risk  Goal: High Fall Risk  Outcome: Goal Ongoing     Problem: Skin Integrity  Goal: Skin integrity intact  Outcome: Goal Ongoing  Goal: Healing of skin (Wound & Incision)  Outcome: Goal Ongoing  Goal: Healing of skin (Pressure Injury)  Outcome: Goal Ongoing     Problem: Mobility/Activity Intolerance  Goal: Maximize functional ADL's and mobility outcomes  Outcome: Goal Ongoing     Problem: VTE, Risk of  Goal: Absence of venous thrombosis  Outcome: Goal Ongoing  Goal: Knowledge of warfarin regimen  Outcome: Goal Ongoing     Problem: Injury-Risk of, Non-Violent Physical Restraints  Goal: Absence of Injury while physically restrained (Non-Violent)  Outcome: Goal Ongoing     Problem: Nutrition Deficit  Goal: Adequate nutritional intake  Outcome: Goal Ongoing  Goal: Body weight within specified parameters  Outcome: Goal Ongoing

## 2023-10-26 NOTE — Progress Notes
 RT Adult Assessment Note    NAME:Kevin Obrien             MRN: [id number]             DOB:Nov 04, 1950          AGE: 73 y.o.  ADMISSION DATE: 10/17/2023             DAYS ADMITTED: LOS: 9 days    Additional Comments:  Impressions of the patient: pt resting on RA  Intervention(s)/outcome(s): Re eval  Patient education that was completed: none  Recommendations to the care team: Suction prn    Vital Signs:  Pulse:  85  RR:  18  SpO2:  98%  O2 Device:    Liter Flow:    O2%:      Breath Sounds: Clear/decreased     Respiratory Effort:      Comments:

## 2023-10-26 NOTE — Case Management (ED)
 CMA Note:        Received a request from Omelia Bibles, [facility] to check for auth for IPR. Called two #'s provided and both stated [facility] is closed.      Leyna Vanderkolk  Case Management Assistant  For additional assistance please contact SWCM *

## 2023-10-27 ENCOUNTER — Encounter: Admit: 2023-10-27 | Discharge: 2023-10-27 | Payer: MEDICARE

## 2023-10-27 ENCOUNTER — Inpatient Hospital Stay: Admit: 2023-10-27 | Discharge: 2023-10-27 | Payer: MEDICARE

## 2023-10-27 LAB — POC GLUCOSE
~~LOC~~ BKR POC GLUCOSE: 242 mg/dL — ABNORMAL HIGH (ref 70–100)
~~LOC~~ BKR POC GLUCOSE: 155 mg/dL — ABNORMAL HIGH (ref 70–100)
~~LOC~~ BKR POC GLUCOSE: 283 mg/dL — ABNORMAL HIGH (ref 70–100)
~~LOC~~ BKR POC GLUCOSE: 88 mg/dL (ref 70–100)

## 2023-10-27 LAB — C DIFFICILE BY PCR: ~~LOC~~ BKR C DIFF TOXIN B: NEGATIVE

## 2023-10-27 MED ORDER — MELATONIN 5 MG PO TAB
5 mg | Freq: Every evening | ORAL | 0 refills | Status: DC
Start: 2023-10-27 — End: 2023-10-28
  Administered 2023-10-28: 03:00:00 5 mg via ORAL

## 2023-10-27 MED ORDER — INSULIN REGULAR HUMAN 100 UNIT/ML (3 ML) SC INPN
18 [IU] | SUBCUTANEOUS | 0 refills | Status: DC
Start: 2023-10-27 — End: 2023-10-28
  Administered 2023-10-27 – 2023-10-28 (×3): 18 [IU] via SUBCUTANEOUS

## 2023-10-27 MED ORDER — GADOBENATE DIMEGLUMINE 529 MG/ML (0.1MMOL/0.2ML) IV SOLN
12 mL | Freq: Once | INTRAVENOUS | 0 refills | Status: CP
Start: 2023-10-27 — End: ?
  Administered 2023-10-28: 01:00:00 12 mL via INTRAVENOUS

## 2023-10-27 MED ORDER — ARTIFICIAL TEARS (PF) SINGLE DOSE DROPS GROUP
1 [drp] | Freq: Three times a day (TID) | OPHTHALMIC | 0 refills | Status: DC
Start: 2023-10-27 — End: 2023-10-28
  Administered 2023-10-28 (×3): 1 [drp] via OPHTHALMIC

## 2023-10-27 MED ORDER — QUETIAPINE 25 MG PO TAB
25 mg | Freq: Every evening | ORAL | 0 refills | Status: DC
Start: 2023-10-27 — End: 2023-10-28

## 2023-10-27 MED ORDER — GADOBENATE DIMEGLUMINE 529 MG/ML (0.1MMOL/0.2ML) IV SOLN
12 mL | Freq: Once | INTRAVENOUS | 0 refills | Status: CP
Start: 2023-10-27 — End: ?
  Administered 2023-10-27: 23:00:00 12 mL via INTRAVENOUS

## 2023-10-27 NOTE — Progress Notes
 PHYSICAL THERAPY  PROGRESS NOTE          Name: Kevin Obrien   MRN: [id number]     DOB: [birth year]      Age: 73 y.o.  Admission Date: 10/17/2023     LOS: 10 days     Date of Service: 10/27/2023        Mobility  Patient Turn/Position: Supine  Mobility Level [facility] Highest Level of Mobility (JH-HLM): Stand (1 or more minutes)  Level of Assistance: Assist X1  Assistive Device: Walker  Activity Limited By: Fatigue    Subjective  Significant Hospital Events: 73 y.o. male with hypertension, diabetes and prior CAD s/p PCI (~10 years ago) presented with acute right facial droop, left gaze and dysarthria.  Special Considerations: Feeding tube present (PEG tube 5/15)  Mental / Cognitive: Alert;Cooperative;Follows commands  Pain: Demonstrates no signs of pain  Persons Present: Lobbyist    Home Living Situation  Lives With: Family  Type of Home: House  Entry Stairs: 2  In-Home Stairs: Able to live on main level  Bathroom Setup: Tub/shower unit  Patient Owned Equipment: Cane: Single point  Comments: Pt denies prior use of DME.    Prior Level of Function  Level Of Independence: Independent with ADL and household mobility with device  Comments: intermittent use of SPC per daughter  History of Falls in Past 3 Months: Yes    Bed Mobility/Transfer  Bed Mobility: Supine to Sit: Moderate Assist;Verbal Cues;Head of Bed Elevated;Use of Rail;Requires Extra Time;Safety Considerations;Assist with R LE;Assist with Trunk  Bed Mobility: Sit to Supine: Minimal Assist;Verbal Cues;Bed Flat;Assist with L LE;Requires Extra Time;Safety Considerations  Transfer Type: Sit to/from Stand  Transfer: Assistance Level: To/From;Bed;Moderate Assist  Transfer: Assistive Device: Nurse, adult  Transfers: Type Of Assistance: Verbal Cues;Elevated Bed;For Balance;For Strength Deficit;For Safety Considerations;Requires Extra Time (Assist with hand positioning)  End Of Activity Status: In Bed;Nursing Notified;Instructed Patient to Use Call Light;Instructed Patient to Request Assist with Mobility (bed alarm active, all needs met)  Comments: Patient with multiple BMs during session, patient completed multiple sit to stands and stood at [facility] for posterior hygeine. Unable to complete gait due to bowel frequency.     Assessment/Progress  Impaired Mobility Due To: Impaired Balance;Decreased Activity Tolerance;Decreased Strength  Impaired Strength Due To: Medical Status Limitation;Decreased Activity Tolerance  Assessment/Progress: Should Improve w/ Continued PT    AM-PAC 6 Clicks Basic Mobility Inpatient  Turning from your back to your side while in a flat bed without using bed rails: A lot  Moving from lying on your back to sitting on the side of a flat bed without using bedrails : A Lot  Moving to and from a bed to a chair (including a wheelchair): A Lot  Standing up from a chair using your arms (e.g. wheelchair, or bedside chair): A Lot  To walk in hospital room: Total  Climbing 3-5 steps with a railing: Total  Basic Mobility Inpatient Raw Score: 10  Standardized (T-scale) Score: 28.13  AM-PAC Basic Mobility Functional Stage: -11.95-33 Limited Movement  Mobility Goal [facility]: JH-HLM 4 Move to Chair    Goals  Goal Formulation: With Patient/Family  Time For Goal Achievement: 5 days  Patient Will Go Supine To/From Sit: w/ Stand By Assist  Patient Will Transfer Sit to Stand: w/ Stand By Assist  Patient Will Ambulate: 51-100 Feet, w/ Jeananne Mighty, w/ Stand By Assist  Patient Will Go Up / Down Stairs: 1-2 Stairs, w/ Stand By Assist  Plan  Treatment Interventions: Mobility training;Balance activities  Plan Frequency: 5-7 Days per Week  PT Plan for Next Visit: Work on sitting and standing balance;transfers, gait with Ax2 HHA or at rail    PT Discharge Recommendations  Recommendation: Inpatient setting;Recommend rehab medicine consult  Patient Currently Requires Physical Assist With: All mobility      Therapist: Juliana Ocean, PT, [place] [address]  Date: 10/27/2023

## 2023-10-27 NOTE — Progress Notes
 Endocrinology Note   Name:  Kevin Obrien MRN:  [id number] Admission Date:  10/17/2023    Principal Problem:    Ischemic stroke (CMS-HCC)  Active Problems:    Diabetes mellitus (CMS-HCC)    Neuropathy    Peripheral artery disease    Essential tremor    Poorly controlled diabetes mellitus (CMS-HCC)    Gaze preference    Moderate malnutrition      Reason for Consult:     10/19/23 1048  CONSULT ENDOCRINOLOGY PHYSICIAN  ONCE        Priority: Routine   Ordering Provider: Vannie Gentile, APRN-NP      Provider: (Not yet assigned)   Question Answer Comment   Reason for consult high A1C, no management    Is this consult for diabetes management? Yes    Consult Type: Co-Management w/Signed Orders    Ordering Provider's or Responsible Teams's Phone/Pager number? Number not on file              Assessment / Plan     Admission date and time: 10/17/2023  8:24 AM     Type 2 Diabetes Mellitus uncontrolled  Insulin dose changed  Non adherance to meds  Acute stroke   Dyslipidemia   Tube feed hyperglycemia   A1c 11.7, 10/17/2023   PTA regimen: non compliant, was supposed to be on meal time insulin. Was taking very sporadically   Was not taking any PO meds either   Hypoglycemic episodes on this regimen: none   Follows up with ? PCP for diabetes management  Diabetic-complications assessment:  CAD, stroke, neuropathy     Aspiration pneumonia    Current diet: Jevity 1.5 TF at goal 45 ml/hr   Glucocorticoids: none     Glucose values over the past 24 hours:    Estimated Creatinine Clearance: 80.4 mL/min (based on SCr of 0.76 mg/dL).    Recent Labs     10/25/23  1600 10/25/23  2146 10/26/23  0601 10/26/23  1219 10/26/23  1524 10/26/23  1755 10/26/23  2106 10/27/23  0528   GLUPOC 196* 225* 233* 324* 266* 308* 242* 88       Diabetes Regimen Changes today based on glucose values:    Continue insulin glargine 18u every day   Regular insulin not given yesterday until 11.33 pm 20 u. Feeding finished for few hours and therefore this morning glucose was 88. Will reduce regular insulin to 18 U q6 hrs. Morning dose also not given in view of glucose of 88.  Continue correction factor q6h      Goal BG 140-180.     Diabetes Plans Post Discharge  Diabetes regimen: TBD  He will most likely need insulin on discharge.   We will request for outpatient follow-up. Family would like to come to [place] [facility]. He used to see  DM clinic and last visit was in 2020        Subjective:         Feeds running at goal at this time. Patient woke up    Vital Signs: Last Filed In 24 Hours Vital Signs: 24 Hour Range   BP: 146/77 (05/18 0530)  Temp: 36.6 ?C (97.9 ?F) (05/18 0530)  Pulse: 80 (05/18 0530)  Respirations: 16 PER MINUTE (05/18 0530)  SpO2: 96 % (05/18 0530)  O2 Device: None (Room air) (05/18 0530) BP: (117-169)/(52-79)   Temp:  [36.4 ?C (97.5 ?F)-36.7 ?C (98.1 ?F)]   Pulse:  [67-87]   Respirations:  [id number]  PER MINUTE-18 PER MINUTE]   SpO2:  [96 %-98 %]   O2 Device: None (Room air)          Peg tube feeds running  Patient is awake  Breathing on room air      Current Facility-Administered Medications:     acetaminophen (OFIRMEV) 1,000 mg injection 100 mL, 1,000 mg, Intravenous, Q8H PRN, Nelly Banco, MD, Last Rate: 400 mL/hr at 10/25/23 0338, 1,000 mg at 10/25/23 1610    acetaminophen (TYLENOL) tablet 650 mg, 650 mg, PEG Tube, Q4H PRN, 650 mg at 10/26/23 2105 **OR** acetaminophen (TYLENOL) rectal suppository 650 mg, 650 mg, Rectal, Q4H PRN, Gerkovich, Kendal M, APRN-NP    amLODIPine (NORVASC) tablet 10 mg, 10 mg, Per NG tube, QDAY, Gerkovich, Kendal M, APRN-NP, 10 mg at 10/26/23 1029    amoxicillin-potassium clavulanate (AUGMENTIN) tablet 875 mg, 875 mg, Feeding Tube, BID w/meals, Lingam, Sivani, MBBS, 875 mg at 10/26/23 1758    aspirin chewable tablet 81 mg, 81 mg, Per NG tube, QDAY, Gerkovich, Kendal M, APRN-NP, 81 mg at 10/26/23 1029    clopiDOGreL (PLAVIX) tablet 75 mg, 75 mg, Per NG tube, QDAY, Gerkovich, Kendal M, APRN-NP, 75 mg at 10/26/23 1030    dextrose 50% (D50) syringe 25-50 mL, 12.5-25 g, Intravenous, PRN, Dominic Friendly M, APRN-NP    diclofenac sodium (VOLTAREN) 1 % topical gel 2 g, 2 g, Topical, QID PRN, Nelly Banco, MD, 2 g at 10/24/23 9604    Diet Enteral Feeding Standard Infusion, , SEE ADMIN INSTRUCTIONS, Continuous, Gerkovich, Kendal M, APRN-NP, Last Rate: 45 mL/hr at 10/26/23 1032, Rate Change at 10/26/23 1032    enoxaparin (LOVENOX) syringe 40 mg, 40 mg, Subcutaneous, QDAY(21), Shaker, Faris, MBChB, 40 mg at 10/26/23 2105    flumazeniL (ROMAZICON) injection 0.2 mg, 0.2 mg, Intravenous, PRN, Alanda Allegra P, APRN-NP    insulin aspart (U-100) (NOVOLOG FLEXPEN U-100 INSULIN) injection PEN 0-12 Units, 0-12 Units, Subcutaneous, Q6H, Dishon Kehoe, MD, 4 Units at 10/26/23 2304    insulin glargine (LANTUS SOLOSTAR U-100 INSULIN) injection PEN 18 Units, 18 Units, Subcutaneous, QDAY, Sheridyn Canino, MD, 18 Units at 10/26/23 1246    insulin regular human (NOVOLIN R FLEXPEN) injection PEN 18 Units, 18 Units, Subcutaneous, Q6H, Zacherie Honeyman, MD    lidocaine hcl (XYLOCAINE) 2 % jelly (URO-JET), , Topical, PRN, Lingam, Sivani, MBBS, Given at 10/23/23 1545    lisinopriL (ZESTRIL) tablet 20 mg, 20 mg, Feeding Tube, QDAY, Camerucci, Emanuele, MD    metoprolol tartrate tablet 25 mg, 25 mg, Feeding Tube, BID, Gerkovich, Kendal M, APRN-NP, 25 mg at 10/26/23 2105    nalOXone [facility]) injection 0.08 mg, 0.08 mg, Intravenous, PRN, Andris Banning, APRN-NP    pancrelipase 20,880 Units/sodium bicarbonate 650 mg ([place] CLOG DESTROYER), , Feeding Tube, PRN (On Call from Rx), Seabron Cypress, Kendal M, APRN-NP, Given at 10/25/23 1344    phenoL (CHLORASEPTIC) spray 2 spray, 2 spray, Mouth/Throat, PRN, Seabron Cypress, Kendal M, APRN-NP, 2 spray at 10/26/23 2306    [Held by Provider] Protein Supplement Packets, , SEE ADMIN INSTRUCTIONS, QDAY, Vannie Gentile, APRN-NP, Given at 10/23/23 0858    rosuvastatin (CRESTOR) tablet 40 mg, 40 mg, Feeding Tube, QDAY, Gerkovich, Kendal M, APRN-NP, 40 mg at 10/26/23 1030    sertraline (ZOLOFT) oral conc 50 mg, 50 mg, Feeding Tube, QDAY, Lingam, Sivani, MBBS, 50 mg at 10/26/23 1029    thiamine mononitrate (vit B1) tablet 100 mg, 100 mg, Per NG tube, QDAY, Gerkovich, Kendal M, APRN-NP, 100 mg at 10/26/23 1029

## 2023-10-27 NOTE — Progress Notes
 Vascular Neurology Progress Note      Nthony Lefferts  Admission Date: 10/17/2023  LOS: 10 days     Assessment/Plan:  Principal Problem:    Ischemic stroke (CMS-HCC)  Active Problems:    Diabetes mellitus (CMS-HCC)    Neuropathy    Peripheral artery disease    Essential tremor    Poorly controlled diabetes mellitus (CMS-HCC)    Gaze preference    Moderate malnutrition      Lyell Clugston Isidore is a 73 y.o. male with hypertension, diabetes and prior CAD s/p PCI (~10 years ago) presented with acute right facial droop, left gaze and dysarthria and was stroke activated. Initial NIH was 6, and the LKW was the night before. His BP at arrival was 210/85. CT head was negative for acute changes, CTA H&N showed no LVO but significant calcified athero and diffuse intracranial vascular stenoses.  He did not receive TNK due to LKW time and no EVT due to no LVO.  He was loaded with Aspirin and Plavix  with plan for DAPT for 3 months. A1c was 11.7%, endocrine consulted.  ECHO showed a PFO with shunt, but ultrasound of the lower extremities was without DVT. MRI brain showed a right sided pontomedullary brainstem infarct.  In light of the results of the MRI, all his clinical symptoms (dysphagia, facial weakness, gaze deviation) can be explained with the stroke location. He still has corpak, will work with SLP for swallow recs. Pending d/c to [facility] when medically stable.        Neuro exam 10/27/23  Bilateral gaze Palsy, L facial droop, dysarthria, L UE 4/5, 5/5 throughout elsewhere      Acute R pontomedullary infarct  Hx of CAD s/p PCI  Diffuse intracranial athero  Right sided facial weakness (upper and lower)  Forced left gaze deviation  Dysphagia s/p PEG tube placement on 5/15  LUE Weakness  Pseudobulbar Affect      NeuroImaging  CT Head  1.  No acute intracranial hemorrhage or mass effect.   2.  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   3.  Additional cerebral hypodensities, likely secondary to chronic microvascular ischemic changes.   CTA head  1.  Multifocal stenosis of the anterior, middle, and posterior cerebral arteries most likely secondary to significant intracranial atherosclerosis. No large vessel central occlusion is identified   2.  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   CTA neck  1.  Bilateral atherosclerotic plaque at the carotid bulbs and origins of the ICAs without significant/high-grade stenosis of the ICAs by NASCET criteria.   2. 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   3. 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   MRI Brain  1.  Small acute right pontomedullary brainstem infarct   2.  Stable old left basal ganglia infarct and old left thalamic and right supratentorial white matter lacunar infarcts.   3.  Stable generalized cerebral volume loss and moderate patchy supratentorial white matter FLAIR hyperintensities, likely due to chronic microvascular ischemia.   4.  Intracranial right vertebral stenotic-occlusive disease, better assessed on CTA.       Stroke work up  A1c:  11.7  Fasting Lipid Profile: TG 219, Chol 216, LDL 161  TTE: EF of  65%,  bubble study shows a Right to Left Shunting consistent with a PFO, no thrombus/valvular vegetation, LA index WNL  P2Y12 138 - hence he is a Plavix responder  Hypercoagulable work up: not indicated      Stroke Etiology - large artery athero.  With worsening exam and stepwise development of neurological deficits, will repeat MRI with and without contrast to evaluate the burden of his stroke, and rule out an inflammatory/vasculitic process      PLAN   > Repeat MRI head W/WO  > Alternating eye patches, eyepatch for the left eye overnight, artificial tears 3 times daily for the left facial palsy  > Melatonin 5 mg nightly and Seroquel 2550 mg nightly, will check EKG for prolonged QT  > Continue DAPT with aspirin 81 mg daily and clopidogrel 75 mg daily for 90 days followed by aspirin 81 mg indefinitely for secondary stroke prevention.  > PT OT recommending rehab  > Continue Crestor 40 mg daily per cardiology recs  > Consider starting Dextromethorphan-Quinidine in the outpatient setting (unfortunately unavailble on the formulary inpatient) for Pseudobulbar affect, will start Fluoxetine 20mg  in the meantime  > Neurology clinic follow up on dc    Abdominal pain  - KUB, C. 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 Hence, will hold off on further adjustments at this time and resume his oral meds for now.   > Continue amlodipine 10 mg daily, increasing lisinopril to 20 mg daily, metoprolol 25mg  BID  > Cardiology consulted for LVOT obstruction  > Outpatient 30-day continuous cardiac telemetry monitor upon discharge  > Crestor 40 mg daily  > Close follow up with outpatient cardiology    Diabetes mellitus  > Endocrine following -on 10/23/2023-Continue insulin glargine 18u every day   Increase regular insulin to 23u q6h. Hold if tube feeds are held  Continue correction factor q6h  > Appreciate Endocrine recs for dc regimen     Dysphagia  S/p PEG tube placement  PEG tube placed on 5/15 - okay to use PEG tube for meds after 12 hours after placement, and then resume tube feeds for 24 hours after the PEG tube placement  Patient does not have a corpak in place as it is causing him a lot of discomfort, hence until the PEG tube can be used, only IV medications.  > Start Tube feeds after 24 hours of PEG tube placement    LVOT Obstruction  PFO  > Metoprolol 12.5mg  BID - will resume oral dosing on 5/16      DVT Prophylaxis: Lovenox 40mg  qday   Anticoagulation: none  PT/OT recs: IPR  Diet: TF via PEG tube  Dispo: Pending workup  Code: Full      Patient seen and discussed with Dr. Alonso Jan who helped formulate the assessment and  plan.      Yalissa Fink Q. Timmy Forbes, MD  PGY-4 neurology   [phone], available on Voalte     This note was composed with the aid of voice-to-text software transcription Dragon. Please pardon any errors in spelling or grammar.   _______________________________________________________________________    Subjective: Overnight, patient was unable to sleep, evaluate patient at bedside, his family feels that he did not get a lot of sleep over the last 3 days.  I spoke with him this morning, he was concerned about inability to close his left eye.  He is also having diplopia and abdominal pain.  Had more than 3 soft bowel movements over the last 24 hours.      Objective:                  Vital Signs:  Last Filed                   Vital Signs: 24 Hour Range   BP: 146/77 (05/18 0530)  Temp: 36.6 ?C (97.9 ?F) (05/18 0530)  Pulse: 80 (05/18 0530)  Respirations: 16 PER MINUTE (05/18 0530)  SpO2: 96 % (05/18 0530)  O2 Device: None (Room air) (05/18 0530)  BP: (117-169)/(52-85)   Temp:  [36.4 ?C (97.5 ?F)-36.7 ?C (98.1 ?F)]   Pulse:  [67-87]   Respirations:  [16 PER MINUTE-20 PER MINUTE]   SpO2:  [96 %-98 %]   O2 Device: None (Room air)    Intensity Pain Scale (Self Report): (not recorded)    Intake/Output Summary: (Last 24 hours)    Intake/Output Summary (Last 24 hours) at 10/27/2023 0803  Last data filed at 10/26/2023 2336  Gross per 24 hour   Intake 1469 ml   Output 550 ml   Net 919 ml         Medications:  Scheduled Meds:amLODIPine (NORVASC) tablet 10 mg, 10 mg, Per NG tube, QDAY  amoxicillin-potassium clavulanate (AUGMENTIN) tablet 875 mg, 875 mg, Feeding Tube, BID w/meals  aspirin chewable tablet 81 mg, 81 mg, Per NG tube, QDAY  clopiDOGreL (PLAVIX) tablet 75 mg, 75 mg, Per NG tube, QDAY  enoxaparin (LOVENOX) syringe 40 mg, 40 mg, Subcutaneous, QDAY(21)  insulin aspart (U-100) (NOVOLOG FLEXPEN U-100 INSULIN) injection PEN 0-12 Units, 0-12 Units, Subcutaneous, Q6H  insulin glargine (LANTUS SOLOSTAR U-100 INSULIN) injection PEN 18 Units, 18 Units, Subcutaneous, QDAY  insulin regular human (NOVOLIN R FLEXPEN) injection PEN 20 Units, 20 Units, Subcutaneous, Q6H  lisinopriL (ZESTRIL) tablet 20 mg, 20 mg, Feeding Tube, QDAY  metoprolol tartrate tablet 25 mg, 25 mg, Feeding Tube, BID  [Held by Provider] Protein Supplement Packets, , SEE ADMIN INSTRUCTIONS, QDAY  rosuvastatin (CRESTOR) tablet 40 mg, 40 mg, Feeding Tube, QDAY  sertraline (ZOLOFT) oral conc 50 mg, 50 mg, Feeding Tube, QDAY  thiamine mononitrate (vit B1) tablet 100 mg, 100 mg, Per NG tube, QDAY    Continuous Infusions:   Diet Enteral Feeding Standard Infusion 45 mL/hr at 10/26/23 1032     PRN and Respiratory Meds:acetaminophen (OFIRMEV) IV Q8H PRN, acetaminophen Q4H PRN **OR** acetaminophen Q4H PRN, dextrose 50% (D50) IV PRN, diclofenac sodium QID PRN, flumazeniL PRN, lidocaine hcl PRN, nalOXone PRN, pancrelipase 20,880 Units/sodium bicarbonate 650 mg (Primrose CLOG DESTROYER) PRN (On Call from Rx), phenoL PRN       Neuro exam:   Mental status: Drowsy, opening eyes with sternal rub  Speech: Dysarthria   Cranial Nerves:    Normal Abnormal   II Pupils reactive, visual fields normal  III, IV, VI  Patient does not move eyes to the left or right, vertical eye movements intact   V Sensation nml V1-V3    VII  Left upper and lower facial weakness   VIII nml to conversation    IX, X +strong cough    XI Equal shoulder shrug    XII Tongue midline    Muscle/motor: 5/5 in RUE and RLE, 5/5 in LLE, 4/5 in LUE  Sensation: Normal in bilateral upper and lower extremities, no abnormalities    Coordination: Not assessed due to patient fatigue    Gait and Sation: Not assessed    Laboratory Review:  No results found for: PHART, PCO2A, PO2ART, HCO3A, BASEEXA, BASEDEFA, O2SATACAL  Lab Results   Component Value Date    HGB 13.2 (L) 10/27/2023    HCT 37.7 (L) 10/27/2023    PLTCT 313 10/27/2023    WBC 8.40 10/27/2023    NEUT 82.5 (H) 10/22/2023    ANC 10.90 (H) 10/22/2023     Lab Results   Component Value Date    NA 141 10/27/2023    K 3.3 (L) 10/27/2023    CL 106 10/27/2023    CO2 25 10/27/2023    GAP 10 10/27/2023    BUN 15 10/27/2023    CR 0.76 10/27/2023    GLU 74 10/27/2023    CA 9.2 10/27/2023    MG 1.8 10/21/2023    LACTIC 0.9 10/24/2023    TOTPROT 7.3 10/22/2023    AST 19 10/22/2023    ALT 21 10/22/2023    ALKPHOS 71 10/22/2023     Lab Results   Component Value Date    TNI 0.00 02/22/2019     Lab Results   Component Value Date    PTT 27.6 10/17/2023    INR 1.0 (L) 10/17/2023    DIMER 254 02/22/2019     Lab Results   Component Value Date    TSH 0.40 06/05/2023     Lab Results   Component Value Date    CHOL 216 (H) 10/17/2023    TRIG 219 (H) 10/17/2023    HDL 36 (L) 10/17/2023    LDL 161 (H) 10/17/2023    VLDL 43.8 10/17/2023     No results found for: VANPK, VANTR  No results found for: CYCLOSPOR, TACROLIMUS, FREEPHENY     Point of Care Testing:  (Last 24 hours):  FSBS (Manual): (!) 291  Glucose: 74  POC Glucose (Download): 88 (Notified RN)    Radiology and Other Diagnostics Reviewed

## 2023-10-28 ENCOUNTER — Encounter: Admit: 2023-10-28 | Discharge: 2023-10-28 | Payer: MEDICARE

## 2023-10-28 ENCOUNTER — Inpatient Hospital Stay: Admit: 2023-10-28 | Discharge: 2023-11-22 | Payer: MEDICARE

## 2023-10-28 DIAGNOSIS — E1165 Type 2 diabetes mellitus with hyperglycemia: Secondary | ICD-10-CM

## 2023-10-28 DIAGNOSIS — R4701 Aphasia: Secondary | ICD-10-CM

## 2023-10-28 DIAGNOSIS — G510 Bell's palsy: Secondary | ICD-10-CM

## 2023-10-28 DIAGNOSIS — K921 Melena: Secondary | ICD-10-CM

## 2023-10-28 DIAGNOSIS — Z789 Other specified health status: Secondary | ICD-10-CM

## 2023-10-28 DIAGNOSIS — E1142 Type 2 diabetes mellitus with diabetic polyneuropathy: Secondary | ICD-10-CM

## 2023-10-28 DIAGNOSIS — J9601 Acute respiratory failure with hypoxia: Secondary | ICD-10-CM

## 2023-10-28 DIAGNOSIS — A419 Sepsis, unspecified organism: Secondary | ICD-10-CM

## 2023-10-28 DIAGNOSIS — Z955 Presence of coronary angioplasty implant and graft: Secondary | ICD-10-CM

## 2023-10-28 DIAGNOSIS — Z6824 Body mass index (BMI) 24.0-24.9, adult: Secondary | ICD-10-CM

## 2023-10-28 DIAGNOSIS — I6789 Other cerebrovascular disease: Secondary | ICD-10-CM

## 2023-10-28 DIAGNOSIS — E7800 Pure hypercholesterolemia, unspecified: Secondary | ICD-10-CM

## 2023-10-28 DIAGNOSIS — H518 Other specified disorders of binocular movement: Secondary | ICD-10-CM

## 2023-10-28 DIAGNOSIS — Q2112 Patent foramen ovale: Secondary | ICD-10-CM

## 2023-10-28 DIAGNOSIS — E1151 Type 2 diabetes mellitus with diabetic peripheral angiopathy without gangrene: Secondary | ICD-10-CM

## 2023-10-28 DIAGNOSIS — E1140 Type 2 diabetes mellitus with diabetic neuropathy, unspecified: Secondary | ICD-10-CM

## 2023-10-28 DIAGNOSIS — J690 Pneumonitis due to inhalation of food and vomit: Secondary | ICD-10-CM

## 2023-10-28 DIAGNOSIS — F482 Pseudobulbar affect: Secondary | ICD-10-CM

## 2023-10-28 DIAGNOSIS — I639 Cerebral infarction, unspecified: Secondary | ICD-10-CM

## 2023-10-28 DIAGNOSIS — Z794 Long term (current) use of insulin: Secondary | ICD-10-CM

## 2023-10-28 DIAGNOSIS — Z79899 Other long term (current) drug therapy: Secondary | ICD-10-CM

## 2023-10-28 DIAGNOSIS — Z7409 Other reduced mobility: Secondary | ICD-10-CM

## 2023-10-28 DIAGNOSIS — R1310 Dysphagia, unspecified: Secondary | ICD-10-CM

## 2023-10-28 DIAGNOSIS — J302 Other seasonal allergic rhinitis: Secondary | ICD-10-CM

## 2023-10-28 DIAGNOSIS — I2510 Atherosclerotic heart disease of native coronary artery without angina pectoris: Secondary | ICD-10-CM

## 2023-10-28 DIAGNOSIS — Z833 Family history of diabetes mellitus: Secondary | ICD-10-CM

## 2023-10-28 DIAGNOSIS — I6381 Other cerebral infarction due to occlusion or stenosis of small artery: Secondary | ICD-10-CM

## 2023-10-28 DIAGNOSIS — F05 Delirium due to known physiological condition: Secondary | ICD-10-CM

## 2023-10-28 DIAGNOSIS — E785 Hyperlipidemia, unspecified: Secondary | ICD-10-CM

## 2023-10-28 DIAGNOSIS — Z8673 Personal history of transient ischemic attack (TIA), and cerebral infarction without residual deficits: Secondary | ICD-10-CM

## 2023-10-28 DIAGNOSIS — Z91148 Patient's other noncompliance with medication regimen for other reason: Secondary | ICD-10-CM

## 2023-10-28 DIAGNOSIS — M47819 Spondylosis without myelopathy or radiculopathy, site unspecified: Secondary | ICD-10-CM

## 2023-10-28 DIAGNOSIS — E440 Moderate protein-calorie malnutrition: Secondary | ICD-10-CM

## 2023-10-28 DIAGNOSIS — F4321 Adjustment disorder with depressed mood: Secondary | ICD-10-CM

## 2023-10-28 DIAGNOSIS — I672 Cerebral atherosclerosis: Secondary | ICD-10-CM

## 2023-10-28 DIAGNOSIS — I10 Essential (primary) hypertension: Secondary | ICD-10-CM

## 2023-10-28 DIAGNOSIS — G250 Essential tremor: Secondary | ICD-10-CM

## 2023-10-28 DIAGNOSIS — Z87891 Personal history of nicotine dependence: Secondary | ICD-10-CM

## 2023-10-28 DIAGNOSIS — Z9049 Acquired absence of other specified parts of digestive tract: Secondary | ICD-10-CM

## 2023-10-28 DIAGNOSIS — I6502 Occlusion and stenosis of left vertebral artery: Secondary | ICD-10-CM

## 2023-10-28 DIAGNOSIS — R29810 Facial weakness: Secondary | ICD-10-CM

## 2023-10-28 DIAGNOSIS — R471 Dysarthria and anarthria: Secondary | ICD-10-CM

## 2023-10-28 DIAGNOSIS — E113399 Type 2 diabetes mellitus with moderate nonproliferative diabetic retinopathy without macular edema, unspecified eye: Secondary | ICD-10-CM

## 2023-10-28 DIAGNOSIS — R29706 NIHSS score 6: Secondary | ICD-10-CM

## 2023-10-28 LAB — ECG 12-LEAD
P AXIS: 41 degrees
P-R INTERVAL: 128 ms
Q-T INTERVAL: 446 ms
QRS DURATION: 120 ms
QTC CALCULATION (BAZETT): 498 ms
R AXIS: -65 degrees
T AXIS: 56 degrees
VENTRICULAR RATE: 75 {beats}/min

## 2023-10-28 LAB — POC GLUCOSE
~~LOC~~ BKR POC GLUCOSE: 192 mg/dL — ABNORMAL HIGH (ref 70–100)
~~LOC~~ BKR POC GLUCOSE: 261 mg/dL — ABNORMAL HIGH (ref 70–100)
~~LOC~~ BKR POC GLUCOSE: 263 mg/dL — ABNORMAL HIGH (ref 70–100)
~~LOC~~ BKR POC GLUCOSE: 116 mg/dL — ABNORMAL HIGH (ref 70–100)
~~LOC~~ BKR POC GLUCOSE: 88 mg/dL (ref 70–100)

## 2023-10-28 LAB — CULTURE-BLOOD W/SENSITIVITY

## 2023-10-28 MED ORDER — ROSUVASTATIN 20 MG PO TAB
40 mg | Freq: Every day | GASTROSTOMY | 0 refills | Status: DC
Start: 2023-10-28 — End: 2023-11-22
  Administered 2023-10-29 – 2023-11-21 (×25): 40 mg via GASTROSTOMY

## 2023-10-28 MED ORDER — SERTRALINE 20 MG/ML PO CONC
50 mg | Freq: Every day | GASTROSTOMY | 0 refills | 30.00000 days | Status: DC
Start: 2023-10-28 — End: 2023-11-01

## 2023-10-28 MED ORDER — THIAMINE MONONITRATE (VIT B1) 100 MG PO TAB
100 mg | Freq: Every day | NASOGASTRIC | 0 refills | Status: CN
Start: 2023-10-28 — End: ?

## 2023-10-28 MED ORDER — THIAMINE MONONITRATE (VIT B1) 100 MG PO TAB
100 mg | Freq: Every day | NASOGASTRIC | 0 refills | Status: DC
Start: 2023-10-28 — End: 2023-11-22
  Administered 2023-10-29 – 2023-11-21 (×24): 100 mg via NASOGASTRIC

## 2023-10-28 MED ORDER — ASPIRIN 81 MG PO CHEW
81 mg | Freq: Every day | NASOGASTRIC | 0 refills | Status: CN
Start: 2023-10-28 — End: ?

## 2023-10-28 MED ORDER — PHENOL 1.4 % MM SPRA
2 | OROMUCOSAL | 0 refills | Status: DC | PRN
Start: 2023-10-28 — End: 2023-11-22
  Administered 2023-11-03: 03:00:00 2 via OROMUCOSAL

## 2023-10-28 MED ORDER — ENOXAPARIN 40 MG/0.4 ML SC SYRG
40 mg | Freq: Every day | SUBCUTANEOUS | 0 refills | Status: DC
Start: 2023-10-28 — End: 2023-11-22
  Administered 2023-10-29 – 2023-11-21 (×24): 40 mg via SUBCUTANEOUS

## 2023-10-28 MED ORDER — TUBE FEED (ENAR)
0 refills | Status: CN
Start: 2023-10-28 — End: ?

## 2023-10-28 MED ORDER — MELATONIN 5 MG PO TAB
5 mg | Freq: Every evening | ORAL | 0 refills | Status: CN
Start: 2023-10-28 — End: ?

## 2023-10-28 MED ORDER — INSULIN GLARGINE 100 UNIT/ML (3 ML) SC INJ PEN
18 [IU] | Freq: Every day | SUBCUTANEOUS | 0 refills | Status: DC
Start: 2023-10-28 — End: 2023-11-12
  Administered 2023-10-28 – 2023-11-12 (×2): 18 [IU] via SUBCUTANEOUS

## 2023-10-28 MED ORDER — AMLODIPINE 10 MG PO TAB
10 mg | Freq: Every day | NASOGASTRIC | 0 refills | Status: DC
Start: 2023-10-28 — End: 2023-10-29
  Administered 2023-10-29: 13:00:00 10 mg via NASOGASTRIC

## 2023-10-28 MED ORDER — AMOXICILLIN-POT CLAVULANATE 875-125 MG PO TAB
875 mg | Freq: Two times a day (BID) | GASTROSTOMY | 0 refills | Status: CN
Start: 2023-10-28 — End: ?

## 2023-10-28 MED ORDER — ACETAMINOPHEN 1,000 MG/100 ML (10 MG/ML) IV SOLN
1000 mg | INTRAVENOUS | 0 refills | Status: CN | PRN
Start: 2023-10-28 — End: ?

## 2023-10-28 MED ORDER — LOPERAMIDE 2 MG PO CAP
2 mg | Freq: Three times a day (TID) | GASTROSTOMY | 0 refills | Status: DC | PRN
Start: 2023-10-28 — End: 2023-11-22
  Administered 2023-10-29 – 2023-11-17 (×2): 2 mg via GASTROSTOMY

## 2023-10-28 MED ORDER — DICLOFENAC SODIUM 1 % TP GEL
2 g | Freq: Four times a day (QID) | TOPICAL | 0 refills | Status: CN | PRN
Start: 2023-10-28 — End: ?

## 2023-10-28 MED ORDER — MELATONIN 5 MG PO TAB
5 mg | Freq: Every evening | ORAL | 0 refills | Status: DC
Start: 2023-10-28 — End: 2023-10-31
  Administered 2023-10-29 – 2023-10-31 (×3): 5 mg via ORAL

## 2023-10-28 MED ORDER — INSULIN GLARGINE 100 UNIT/ML (3 ML) SC INJ PEN
18 [IU] | Freq: Every day | SUBCUTANEOUS | 0 refills | Status: CN
Start: 2023-10-28 — End: ?

## 2023-10-28 MED ORDER — ARTIFICIAL TEARS (PF) SINGLE DOSE DROPS GROUP
1 [drp] | Freq: Three times a day (TID) | OPHTHALMIC | 0 refills | Status: CN
Start: 2023-10-28 — End: ?

## 2023-10-28 MED ORDER — WHITE PETROLATUM-MINERAL OIL 57.7-31.9 % OP OINT
.25 [in_us] | Freq: Three times a day (TID) | OPHTHALMIC | 0 refills | Status: DC
Start: 2023-10-28 — End: 2023-10-28
  Administered 2023-10-28: 16:00:00 0.25 [in_us] via OPHTHALMIC

## 2023-10-28 MED ORDER — INSULIN REGULAR HUMAN 100 UNIT/ML (3 ML) SC INPN
20 [IU] | SUBCUTANEOUS | 0 refills | Status: DC
Start: 2023-10-28 — End: 2023-10-28
  Administered 2023-10-28: 16:00:00 20 [IU] via SUBCUTANEOUS

## 2023-10-28 MED ORDER — SERTRALINE 20 MG/ML PO CONC
50 mg | Freq: Every day | GASTROSTOMY | 0 refills | Status: DC
Start: 2023-10-28 — End: 2023-10-28

## 2023-10-28 MED ORDER — LIDOCAINE HCL 2 % MM JELP
TOPICAL | 0 refills | Status: DC | PRN
Start: 2023-10-28 — End: 2023-11-22
  Administered 2023-10-30 – 2023-11-20 (×2): 20.0000 mL via TOPICAL

## 2023-10-28 MED ORDER — METOPROLOL TARTRATE 25 MG PO TAB
25 mg | Freq: Two times a day (BID) | GASTROSTOMY | 0 refills | Status: CN
Start: 2023-10-28 — End: ?

## 2023-10-28 MED ORDER — DEXTROSE 50 % IN WATER (D50W) IV SYRG
12.5-25 g | INTRAVENOUS | 0 refills | Status: DC | PRN
Start: 2023-10-28 — End: 2023-11-22

## 2023-10-28 MED ORDER — THIAMINE MONONITRATE (VIT B1) 100 MG PO TAB
100 mg | Freq: Every day | GASTROSTOMY | 0 refills | 10.00000 days | Status: DC
Start: 2023-10-28 — End: 2023-11-01

## 2023-10-28 MED ORDER — WATER BOLUS (ENAR)
100 mL | GASTROSTOMY | 0 refills | Status: DC
Start: 2023-10-28 — End: 2023-10-28

## 2023-10-28 MED ORDER — INSULIN REGULAR HUMAN 100 UNIT/ML (3 ML) SC INPN
20 [IU] | SUBCUTANEOUS | 0 refills | Status: DC
Start: 2023-10-28 — End: 2023-10-29
  Administered 2023-10-29: 06:00:00 20 [IU] via SUBCUTANEOUS

## 2023-10-28 MED ORDER — LIDOCAINE HCL 2 % MM JELP
TOPICAL | 0 refills | Status: CN | PRN
Start: 2023-10-28 — End: ?

## 2023-10-28 MED ORDER — ARTIFICIAL TEARS (PF) SINGLE DOSE DROPS GROUP
1 [drp] | Freq: Three times a day (TID) | OPHTHALMIC | 0 refills | Status: DC
Start: 2023-10-28 — End: 2023-11-11
  Administered 2023-10-28 – 2023-11-11 (×31): 1 [drp] via OPHTHALMIC

## 2023-10-28 MED ORDER — CLOPIDOGREL 75 MG PO TAB
75 mg | Freq: Every day | NASOGASTRIC | 0 refills | Status: CN
Start: 2023-10-28 — End: ?

## 2023-10-28 MED ORDER — LISINOPRIL 20 MG PO TAB
20 mg | Freq: Every day | GASTROSTOMY | 0 refills | Status: DC
Start: 2023-10-28 — End: 2023-11-19
  Administered 2023-10-29 – 2023-11-19 (×22): 20 mg via GASTROSTOMY

## 2023-10-28 MED ORDER — INSULIN ASPART 100 UNIT/ML SC FLEXPEN
0-12 [IU] | SUBCUTANEOUS | 0 refills | Status: DC
Start: 2023-10-28 — End: 2023-10-29
  Administered 2023-10-29: 06:00:00 6 [IU] via SUBCUTANEOUS

## 2023-10-28 MED ORDER — DEXTROSE 50 % IN WATER (D50W) IV SYRG
12.5-25 g | INTRAVENOUS | 0 refills | Status: CN | PRN
Start: 2023-10-28 — End: ?

## 2023-10-28 MED ORDER — LISINOPRIL 20 MG PO TAB
20 mg | Freq: Every day | GASTROSTOMY | 0 refills | 90.00000 days | Status: DC
Start: 2023-10-28 — End: 2023-11-01

## 2023-10-28 MED ORDER — LOPERAMIDE 1 MG/7.5 ML PO LIQD
2 mg | Freq: Three times a day (TID) | ORAL | 0 refills | Status: CN | PRN
Start: 2023-10-28 — End: ?

## 2023-10-28 MED ORDER — ACETAMINOPHEN 1,000 MG/100 ML (10 MG/ML) IV SOLN
1000 mg | INTRAVENOUS | 0 refills | Status: DC | PRN
Start: 2023-10-28 — End: 2023-10-28

## 2023-10-28 MED ORDER — METOPROLOL TARTRATE 25 MG PO TAB
25 mg | Freq: Two times a day (BID) | GASTROSTOMY | 0 refills | Status: DC
Start: 2023-10-28 — End: 2023-11-14
  Administered 2023-10-29 – 2023-11-14 (×34): 25 mg via GASTROSTOMY

## 2023-10-28 MED ORDER — SERTRALINE 20 MG/ML PO CONC
50 mg | Freq: Every day | GASTROSTOMY | 0 refills | Status: CN
Start: 2023-10-28 — End: ?

## 2023-10-28 MED ORDER — PANCRELIPASE-SODIUM BICARBONATE 20,880 K UNIT-650 MG
GASTROSTOMY | 0 refills | Status: DC | PRN
Start: 2023-10-28 — End: 2023-11-22

## 2023-10-28 MED ORDER — INSULIN ASPART 100 UNIT/ML SC FLEXPEN
0-12 [IU] | SUBCUTANEOUS | 0 refills | Status: CN
Start: 2023-10-28 — End: ?

## 2023-10-28 MED ORDER — MELATONIN 5 MG PO TAB
5 mg | Freq: Every evening | ORAL | 0 refills | 28.00000 days | Status: DC
Start: 2023-10-28 — End: 2023-11-01

## 2023-10-28 MED ORDER — TUBE FEED BOLUS (ENAR)
Freq: Every day | 0 refills | Status: DC
Start: 2023-10-28 — End: 2023-10-28

## 2023-10-28 MED ORDER — AMOXICILLIN-POT CLAVULANATE 875-125 MG PO TAB
875 mg | Freq: Two times a day (BID) | GASTROSTOMY | 0 refills | Status: CP
Start: 2023-10-28 — End: ?
  Administered 2023-10-28 – 2023-10-29 (×2): 875 mg via GASTROSTOMY

## 2023-10-28 MED ORDER — ASPIRIN 81 MG PO CHEW
81 mg | Freq: Every day | NASOGASTRIC | 0 refills | Status: DC
Start: 2023-10-28 — End: 2023-11-22
  Administered 2023-10-29 – 2023-11-21 (×24): 81 mg via NASOGASTRIC

## 2023-10-28 MED ORDER — INSULIN ASPART 100 UNIT/ML SC FLEXPEN
0-12 [IU] | SUBCUTANEOUS | 0 refills | 30.00000 days | Status: DC
Start: 2023-10-28 — End: 2023-11-01

## 2023-10-28 MED ORDER — ARTIFICIAL TEARS (PF) SINGLE DOSE DROPS GROUP
1 [drp] | Freq: Three times a day (TID) | OPHTHALMIC | 0 refills | 30.00000 days | Status: DC
Start: 2023-10-28 — End: 2023-11-01

## 2023-10-28 MED ORDER — PHENOL 1.4 % MM SPRA
2 | OROMUCOSAL | 0 refills | Status: CN | PRN
Start: 2023-10-28 — End: ?

## 2023-10-28 MED ORDER — AMLODIPINE 10 MG PO TAB
10 mg | Freq: Every day | NASOGASTRIC | 0 refills | 90.00000 days | Status: DC
Start: 2023-10-28 — End: 2023-11-01

## 2023-10-28 MED ORDER — LOPERAMIDE 1 MG/7.5 ML PO LIQD
2 mg | Freq: Three times a day (TID) | ORAL | 0 refills | Status: DC | PRN
Start: 2023-10-28 — End: 2023-10-28
  Administered 2023-10-28: 17:00:00 2 mg via ORAL

## 2023-10-28 MED ORDER — INSULIN REGULAR HUMAN 100 UNIT/ML (3 ML) SC INPN
20 [IU] | SUBCUTANEOUS | 0 refills | Status: CN
Start: 2023-10-28 — End: ?

## 2023-10-28 MED ORDER — ONDANSETRON 4 MG PO TBDI
4 mg | ORAL | 0 refills | Status: DC | PRN
Start: 2023-10-28 — End: 2023-10-28

## 2023-10-28 MED ORDER — DICLOFENAC SODIUM 1 % TP GEL
2 g | Freq: Four times a day (QID) | TOPICAL | 0 refills | Status: DC | PRN
Start: 2023-10-28 — End: 2023-11-22

## 2023-10-28 MED ORDER — WHITE PETROLATUM-MINERAL OIL 57.7-31.9 % OP OINT
.25 [in_us] | Freq: Three times a day (TID) | OPHTHALMIC | 0 refills | Status: CN
Start: 2023-10-28 — End: ?

## 2023-10-28 MED ORDER — LISINOPRIL 20 MG PO TAB
20 mg | Freq: Every day | GASTROSTOMY | 0 refills | Status: CN
Start: 2023-10-28 — End: ?

## 2023-10-28 MED ORDER — AMOXICILLIN-POT CLAVULANATE 875-125 MG PO TAB
1 | Freq: Two times a day (BID) | GASTROSTOMY | 0 refills | 7.00000 days | Status: DC
Start: 2023-10-28 — End: 2023-11-01

## 2023-10-28 MED ORDER — LOPERAMIDE 1 MG/7.5 ML PO LIQD
2 mg | Freq: Three times a day (TID) | ORAL | 0 refills | 23.00000 days | Status: DC | PRN
Start: 2023-10-28 — End: 2023-11-01

## 2023-10-28 MED ORDER — CLOPIDOGREL 75 MG PO TAB
75 mg | Freq: Every day | NASOGASTRIC | 0 refills | 90.00000 days | Status: DC
Start: 2023-10-28 — End: 2023-11-01

## 2023-10-28 MED ORDER — AMLODIPINE 10 MG PO TAB
10 mg | Freq: Every day | NASOGASTRIC | 0 refills | Status: CN
Start: 2023-10-28 — End: ?

## 2023-10-28 MED ORDER — ROSUVASTATIN 10 MG PO TAB
40 mg | Freq: Every day | GASTROSTOMY | 0 refills | Status: CN
Start: 2023-10-28 — End: ?

## 2023-10-28 MED ORDER — METOPROLOL TARTRATE 25 MG PO TAB
25 mg | Freq: Two times a day (BID) | GASTROSTOMY | 0 refills | 90.00000 days | Status: DC
Start: 2023-10-28 — End: 2023-11-01

## 2023-10-28 MED ORDER — INSULIN GLARGINE 100 UNIT/ML (3 ML) SC INJ PEN
18 [IU] | Freq: Every day | SUBCUTANEOUS | 0 refills | 68.00000 days | Status: DC
Start: 2023-10-28 — End: 2023-11-01

## 2023-10-28 MED ORDER — BISACODYL 10 MG RE SUPP
10 mg | Freq: Every day | RECTAL | 0 refills | Status: DC | PRN
Start: 2023-10-28 — End: 2023-11-22

## 2023-10-28 MED ORDER — WATER BOLUS (ENAR)
100 mL | GASTROSTOMY | 0 refills | Status: DC
Start: 2023-10-28 — End: 2023-10-29

## 2023-10-28 MED ORDER — ENOXAPARIN 40 MG/0.4 ML SC SYRG
40 mg | Freq: Every day | SUBCUTANEOUS | 0 refills | Status: CN
Start: 2023-10-28 — End: ?

## 2023-10-28 MED ORDER — LOPERAMIDE 1 MG/7.5 ML PO LIQD
2 mg | Freq: Three times a day (TID) | ORAL | 0 refills | Status: DC | PRN
Start: 2023-10-28 — End: 2023-10-28

## 2023-10-28 MED ORDER — CLOPIDOGREL 75 MG PO TAB
75 mg | Freq: Every day | NASOGASTRIC | 0 refills | Status: DC
Start: 2023-10-28 — End: 2023-11-22
  Administered 2023-10-29 – 2023-11-21 (×24): 75 mg via NASOGASTRIC

## 2023-10-28 MED ORDER — ASPIRIN 81 MG PO CHEW
81 mg | Freq: Every day | GASTROSTOMY | 0 refills | 30.00000 days | Status: DC
Start: 2023-10-28 — End: 2023-11-01

## 2023-10-28 MED ORDER — SERTRALINE 50 MG PO TAB
50 mg | Freq: Every day | GASTROSTOMY | 0 refills | Status: DC
Start: 2023-10-28 — End: 2023-11-07
  Administered 2023-10-29 – 2023-11-07 (×10): 50 mg via GASTROSTOMY

## 2023-10-28 MED ORDER — NUTRITIONAL SUPPLEMENT (ENAR)
Freq: Every day | 0 refills | Status: DC
Start: 2023-10-28 — End: 2023-10-28

## 2023-10-28 MED ORDER — INSULIN REGULAR HUMAN 100 UNIT/ML (3 ML) SC INPN
20 [IU] | SUBCUTANEOUS | 0 refills | Status: DC
Start: 2023-10-28 — End: 2023-11-01

## 2023-10-28 MED ORDER — PANCRELIPASE-SODIUM BICARBONATE 20,880 K UNIT-650 MG
GASTROSTOMY | 0 refills | Status: CN | PRN
Start: 2023-10-28 — End: ?

## 2023-10-28 MED ORDER — ACETAMINOPHEN 500 MG PO TAB
1000 mg | GASTROSTOMY | 0 refills | Status: DC | PRN
Start: 2023-10-28 — End: 2023-11-03
  Administered 2023-10-29 – 2023-11-03 (×4): 1000 mg via GASTROSTOMY

## 2023-10-28 MED ORDER — ROSUVASTATIN 40 MG PO TAB
40 mg | Freq: Every day | GASTROSTOMY | 0 refills | 90.00000 days | Status: DC
Start: 2023-10-28 — End: 2023-11-01

## 2023-10-28 MED ORDER — WHITE PETROLATUM-MINERAL OIL 57.7-31.9 % OP OINT
.25 [in_us] | Freq: Three times a day (TID) | OPHTHALMIC | 0 refills | Status: DC
Start: 2023-10-28 — End: 2023-11-22
  Administered 2023-10-28: 23:00:00 0.25 [in_us] via OPHTHALMIC

## 2023-10-28 MED ORDER — TUBE FEED BOLUS (ENAR)
Freq: Every day | 0 refills | Status: DC
Start: 2023-10-28 — End: 2023-10-29

## 2023-10-28 MED ORDER — WHITE PETROLATUM-MINERAL OIL 57.7-31.9 % OP OINT
.25 [in_us] | Freq: Three times a day (TID) | OPHTHALMIC | 0 refills | Status: DC
Start: 2023-10-28 — End: 2023-11-01

## 2023-10-28 NOTE — Progress Notes
 OCCUPATIONAL THERAPY  NOTE   Name: Kevin Obrien   MRN: [id number]     DOB: [birth year]      Age: 73 y.o.  Admission Date: 10/17/2023     LOS: 11 days     Date of Service: 10/28/2023       10/28/23 1225   Mobility   Patient Turn/Position Chair   Mobility Level [employer] Highest Level of Mobility (JH-HLM) 5   Distance Walked (feet) 3 ft   Level of Assistance Assist X1   Assistive Device Hand Held   Activity Limited By Weakness   Subjective   Significant Hospital Events 73 y.o. male with hypertension, diabetes and prior CAD s/p PCI (~10 years ago) presented with acute right facial droop, left gaze and dysarthria.   Special Considerations Feeding tube present  (PEG tube 5/15)   Mental / Cognitive Alert;Cooperative;Follows commands   Pain No complaint of pain   Persons Present Occupational Therapist   Home Living Situation   Lives With Family   Type of Home House   Entry Stairs 2   In-Home Stairs Able to live on main level   Bathroom Setup Tub/shower unit   Patient Owned Equipment Cane: Single point   Comments Pt denies prior use of DME.   Prior Level Of Function   Level Of Independence Independent with ADL and household mobility with device   Comments intermittent use of SPC per daughter   History of Falls in Past 3 Months Yes   Vision   Comment L eye with difficulty tracking especially across midline. Eye patch for L eye delivered to Pt via request from team. Therapist instructing Pt to wear at night only per team request. Pt verbalizing understanding.   ADL's   Where Assessed Edge of Bed   LE Dressing Assist Moderate Assist   LE Dressing Deficits Setup;Steadying;Supervision/Safety;Verbal Cueing;Increased Time To Complete;Thread LLE Into Underwear;Thread RLE Into Underwear;Thread LLE Into Pants;Thread RLE Into Pants;Pull Up Over Hips   Comment Pt seated EOB copmleting LB dressing with hospital briefs and boxers. Therapist holding brief open for Pt to thread BLE through holes and then Pt able to pull up to thigh area. Pt able to thread RLE into boxer hole w/o physical assist but then requiring therapist to hold boxers to thread LLE through hole. Pt standing with modA x1 from therapist and able to pull brief/boxers up on front side but needing assist to pull up in the back.   ADL Mobility   Bed Mobility: Supine to Sit Minimal assist;Verbal cues  (Cues for hand placement to push off of bed/rail and assist to get feet fully off bed ad trunk into opitmal upright position)   Transfer Type Stand pivot   Transfer: Assistance Level From;Bed;To;Bedside chair;Moderate assist   Transfer: Assistive Device Hand hold assist   Transfer: Type of Assistance For balance;For safety considerations;For strength deficit;Requires extra time   Other Transfer Type Sit to/from stand   Other Transfer: Assistance Level To/from;Bed;Moderate assist   Other Transfer: Assistive Device Hand hold assist   Other Transfer: Type of Assistance For balance;For safety considerations;For slide board placement;Requires extra time;Verbal cues   End of Activity Status Up in chair;Nursing notified;Instructed patient to use call light;Instructed patient to request assist with mobility   Sitting Balance Static sitting balance;Dynamic sitting balance;1 UE support;Moderate assist  (modA for sitting balance when performing LB dressing due to Pt reaching forward and then coming back to sitting upright. Pt losing balance seated EOB after LB dressing and  leaning sideways onto RUE and able to push self back to sitting EOB with minA.)   Standing Balance 2 UE support;Minimal assist   Gait Distance 3 feet   Gait: Assistance Level Moderate assist;Safety considerations   Gait: Assistive Device Hand hold assist   Activity Tolerance   Endurance 2/5 Tolerates 10-20 Minutes Exercise w/Multiple Rests   Cognition   Expression Expressive Aphasia   Social Interaction Interacts in a Spontaneous,Cooperative Manner   Problem Solving Cueing to Sequence Task;Direction Following Assist Attention Awake/Alert   Education   Persons Educated Patient   Barriers To Learning Impaired Communication   Interventions Repetition of Instructions;Physical Cueing   Teaching Methods Verbal Instruction;Demonstration   Patient Response Return Demonstration   Topics Role of OT, Goals for Therapy;Energy Conservation   Goal Formulation With Patient   Assessment   Assessment Decreased ADL Status;Decreased UE ROM;Decreased UE Strength;Decreased Endurance   Prognosis Fair;w/Cont OT s/p Acute Discharge   Goal Formulation Pt/family   AM-PAC 6 Clicks Daily Activity Inpatient   Putting on and taking off regular lower body clothes 2   Bathing (Including washing, rinsing, drying) 1   Toileting, which includes using toilet, bedpan, or urinal 3   Putting on and taking off regular upper body clothing 3   Taking care of personal grooming such as brushing teeth 3   Eating meals 1   Daily Activity Raw Score 13   Standardized (T-scale) Score 32.03   Plan    OT Frequency 5x/week   OT Plan for Next Visit EOB activities, move to recliner, repeated STS   ADL Goals   Patient Will Perform All ADL's w/ Stand By Assist   Functional Transfer Goals   Pt Will Perform All Functional Transfers w/ Stand By Assist   OT Discharge Recommendations   Recommendation Inpatient setting;Recommend rehab medicine consult   Patient Currently Requires Physical Assist With All mobility;All personal care ADLs;All home functioning ADLs   Therapy Cosign   Therapy Cosign Cosign w/ Revisions Completed       Dynamometer Grip Assessment:   3 Trial Average    L Hand (lbs) R Hand (lbs)     Trial 1   5 20    Trial 2 6 16    Trial 3 4 20    Average 5 19     Grip Assessment norms according to age grouping:       Male: 24-74 R AVG: 75.3 ; SD: 21.5 -- L AVG: 64.8 ; SD: 18.1      Therapist: Blair Bumpers  Date: 10/28/2023

## 2023-10-28 NOTE — Care Plan
 Problem: Discharge Planning  Goal: Participation in plan of care  Outcome: Goal Ongoing  Goal: Knowledge regarding plan of care  Outcome: Goal Ongoing  Goal: Prepared for discharge  Outcome: Goal Ongoing     Problem: Neurological Status, Impaired/Altered  Goal: Progress toward maximizing functional outcomes  Outcome: Goal Ongoing  Goal: Cognitive status restored to baseline  Outcome: Goal Ongoing     Problem: High Fall Risk  Goal: High Fall Risk  Outcome: Goal Ongoing     Problem: Skin Integrity  Goal: Skin integrity intact  Outcome: Goal Ongoing  Goal: Healing of skin (Wound & Incision)  Outcome: Goal Ongoing  Goal: Healing of skin (Pressure Injury)  Outcome: Goal Ongoing     Problem: Mobility/Activity Intolerance  Goal: Maximize functional ADL's and mobility outcomes  Outcome: Goal Ongoing     Problem: VTE, Risk of  Goal: Absence of venous thrombosis  Outcome: Goal Ongoing  Goal: Knowledge of warfarin regimen  Outcome: Goal Ongoing     Problem: Injury-Risk of, Non-Violent Physical Restraints  Goal: Absence of Injury while physically restrained (Non-Violent)  Outcome: Goal Ongoing     Problem: Nutrition Deficit  Goal: Adequate nutritional intake  Outcome: Goal Ongoing  Goal: Body weight within specified parameters  Outcome: Goal Ongoing     Problem: Infection, Risk of  Goal: Absence of infection  Outcome: Goal Ongoing  Goal: Knowledge of Infection Control Procedures  Outcome: Goal Ongoing

## 2023-10-28 NOTE — Progress Notes
 Vascular Neurology Progress Note      Kevin Obrien  Admission Date: 10/17/2023  LOS: 11 days     Assessment/Plan:  Principal Problem:    Ischemic stroke (CMS-HCC)  Active Problems:    Diabetes mellitus (CMS-HCC)    Neuropathy    Peripheral artery disease    Essential tremor    Poorly controlled diabetes mellitus (CMS-HCC)    Gaze preference    Moderate malnutrition      Kevin Obrien is a 73 y.o. male with hypertension, diabetes and prior CAD s/p PCI (~10 years ago) presented with acute right facial droop, left gaze and dysarthria and was stroke activated. Initial NIH was 6, and the LKW was the night before. His BP at arrival was 210/85. CT head was negative for acute changes, CTA H&N showed no LVO but significant calcified athero and diffuse intracranial vascular stenoses.  He did not receive TNK due to LKW time and no EVT due to no LVO.  He was loaded with Aspirin and Plavix  with plan for DAPT for 3 months. A1c was 11.7%, endocrine consulted.  ECHO showed a PFO with shunt, but ultrasound of the lower extremities was without DVT. MRI brain showed a right sided pontomedullary brainstem infarct.  In light of the results of the MRI, all his clinical symptoms (dysphagia, facial weakness, gaze deviation) can be explained with the stroke location. He still has corpak, will work with SLP for swallow recs. Pending d/c to [facility] when medically stable.        Neuro exam 10/28/23  Bilateral gaze Palsy, L facial droop, dysarthria, L UE 4/5, 5/5 throughout elsewhere      Acute R pontomedullary infarct  Hx of CAD s/p PCI  Diffuse intracranial athero  Right sided facial weakness (upper and lower)  Forced left gaze deviation  Dysphagia s/p PEG tube placement on 5/15  LUE Weakness  Pseudobulbar Affect      NeuroImaging  CT Head  1.  No acute intracranial hemorrhage or mass effect.   2.  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   3.  Additional cerebral hypodensities, likely secondary to chronic microvascular ischemic changes.   CTA head  1.  Multifocal stenosis of the anterior, middle, and posterior cerebral arteries most likely secondary to significant intracranial atherosclerosis. No large vessel central occlusion is identified   2.  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   CTA neck  1.  Bilateral atherosclerotic plaque at the carotid bulbs and origins of the ICAs without significant/high-grade stenosis of the ICAs by NASCET criteria.   2. 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   3. 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   MRI Brain  1.  Small acute right pontomedullary brainstem infarct   2.  Stable old left basal ganglia infarct and old left thalamic and right supratentorial white matter lacunar infarcts.   3.  Stable generalized cerebral volume loss and moderate patchy supratentorial white matter FLAIR hyperintensities, likely due to chronic microvascular ischemia.   4.  Intracranial right vertebral stenotic-occlusive disease, better assessed on CTA.   Repeat MRI Brain 05/18  1.  Redemonstration of evolving acute-subacute pontomedullary infarcts, with increased conspicuity of ischemia in the posterior brain stem since the comparison examination.  2.  Punctate focus of right lateral precentral gyral cortical enhancement without corresponding signal abnormality on additional sequences, potentially reflecting incidental vascular enhancement. Tiny subacute enhancing infarct could appear similar.   3.  Stable mild generalized cerebral and cerebellar volume loss, moderate chronic microvascular ischemic cerebral white matter changes, and multiple superimposed scattered superimposed chronic infarcts.   Brain MRA 05/18  1.  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   2.  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 No   vessel wall edema or enhancement to suggest active arterial wall inflammation.       Stroke work up  A1c: 11.7  Fasting Lipid Profile: TG 219, Chol 216, LDL 161  TTE: EF of  65%, bubble study shows a Right to Left Shunting consistent with a PFO, no thrombus/valvular vegetation, LA index WNL  P2Y12 138 - hence he is a Plavix responder  Hypercoagulable work up: not indicated      Stroke Etiology - large artery athero.  With worsening exam and stepwise development of neurological deficits, will repeat MRI with and without contrast to evaluate the burden of his stroke, and rule out an inflammatory/vasculitic process      PLAN   > Alternating eye patches, eyepatch for the left eye overnight, artificial tears 3 times daily for the left facial palsy  > Melatonin 5 mg nightly and Seroquel 2550 mg nightly, will check EKG for prolonged QT  > Continue DAPT with aspirin 81 mg daily and clopidogrel 75 mg daily for 90 days followed by aspirin 81 mg indefinitely for secondary stroke prevention.  > Eye lubricant for dry eyes  > PT OT recommending rehab  > Continue Crestor 40 mg daily per cardiology recs  > Consider starting Dextromethorphan-Quinidine in the outpatient setting (unfortunately unavailble on the formulary inpatient) for Pseudobulbar affect, will start Fluoxetine 20mg  in the meantime  > Neurology clinic follow up on dc    Loose stools  - C. Diff negative  - Ordered Fecal Leukocytes  - Start loperamide    Abdominal pain  - KUB, C. 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 Hence, will hold off on further adjustments at this time and resume his oral meds for now.   > Continue amlodipine 10 mg daily, increasing lisinopril to 20 mg daily, metoprolol 25mg  BID  > Cardiology consulted for LVOT obstruction  > Outpatient 30-day continuous cardiac telemetry monitor upon discharge  > Crestor 40 mg daily  > Close follow up with outpatient cardiology    Diabetes mellitus  > Endocrine following -on 10/23/2023-Continue insulin glargine 18u every day   Increase regular insulin to 23u q6h. Hold if tube feeds are held  Continue  correction factor q6h  > Appreciate Endocrine recs for dc regimen     Dysphagia  S/p PEG tube placement  PEG tube placed on 5/15 - okay to use PEG tube for meds after 12 hours after placement, and then resume tube feeds for 24 hours after the PEG tube placement  Patient does not have a corpak in place as it is causing him a lot of discomfort, hence until the PEG tube can be used, only IV medications.  > Continue Tube feeds    LVOT Obstruction  PFO  > Metoprolol 12.5mg  BID - will resume oral dosing on 5/16      DVT Prophylaxis: Lovenox 40mg  qday   Anticoagulation: none  PT/OT recs: [facility]  Diet: TF via PEG tube  Dispo: Pending workup  Code: Full      Patient seen and discussed with Dr. Alonso Jan who helped formulate the assessment and plan.      Providence Brow, MD  PGY-3 neurology   Available on Voalte _______________________________________________________________________    Subjective: The patient was doing well today. He has difficulty closing the left eye. We discussed about using an eye patch and he was agreeable.      Objective:                  Vital Signs:  Last Filed                   Vital Signs: 24 Hour Range   BP: 162/75 (05/18 2156)  Temp: 36.9 ?C (98.4 ?F) (05/18 2156)  Pulse: 81 (05/18 2156)  Respirations: 18 PER MINUTE (05/18 2156)  SpO2: 97 % (05/18 2156)  O2 Device: None (Room air) (05/18 2156)  BP: (121-163)/(59-75)   Temp:  [36.9 ?C (98.4 ?F)-37.2 ?C (99 ?F)]   Pulse:  [74-83]   Respirations:  [18 PER MINUTE]   SpO2:  [95 %-97 %]   O2 Device: None (Room air)    Intensity Pain Scale (Self Report): (not recorded)    Intake/Output Summary: (Last 24 hours)    Intake/Output Summary (Last 24 hours) at 10/28/2023 0731  Last data filed at 10/28/2023 0457  Gross per 24 hour   Intake 963 ml   Output 1050 ml   Net -87 ml         Medications:  Scheduled Meds:amLODIPine (NORVASC) tablet 10 mg, 10 mg, Per NG tube, QDAY  amoxicillin-potassium clavulanate (AUGMENTIN) tablet 875 mg, 875 mg, Feeding Tube, BID w/meals  artificial tears (PF) single dose ophthalmic solution 1 drop, 1 drop, Left Eye, TID  aspirin chewable tablet 81 mg, 81 mg, Per NG tube, QDAY  clopiDOGreL (PLAVIX) tablet 75 mg, 75 mg, Per NG tube, QDAY  enoxaparin (LOVENOX) syringe 40 mg, 40 mg, Subcutaneous, QDAY(21)  insulin aspart (U-100) (NOVOLOG FLEXPEN U-100 INSULIN) injection PEN 0-12 Units, 0-12 Units, Subcutaneous, Q6H  insulin glargine (LANTUS SOLOSTAR U-100 INSULIN) injection PEN 18 Units, 18 Units, Subcutaneous, QDAY  insulin regular human (NOVOLIN R FLEXPEN) injection PEN 18 Units, 18 Units, Subcutaneous, Q6H  lisinopriL (ZESTRIL) tablet 20 mg, 20 mg, Feeding Tube, QDAY  melatonin tablet 5 mg, 5 mg, Oral, QHS  metoprolol tartrate tablet 25 mg, 25 mg, Feeding Tube, BID  [Held by Provider] Protein Supplement Packets, , SEE ADMIN INSTRUCTIONS, QDAY  [Held by Provider] QUEtiapine (SEROquel) tablet 25 mg, 25 mg, Oral, QHS  rosuvastatin (CRESTOR) tablet 40 mg, 40 mg, Feeding Tube, QDAY  sertraline (ZOLOFT) oral conc 50 mg, 50 mg, Feeding Tube, QDAY  thiamine mononitrate (  vit B1) tablet 100 mg, 100 mg, Per NG tube, QDAY    Continuous Infusions:   Diet Enteral Feeding Standard Infusion 45 mL/hr at 10/27/23 1643     PRN and Respiratory Meds:acetaminophen (OFIRMEV) IV Q8H PRN, acetaminophen Q4H PRN **OR** acetaminophen Q4H PRN, dextrose 50% (D50) IV PRN, diclofenac sodium QID PRN, flumazeniL PRN, lidocaine hcl PRN, nalOXone PRN, pancrelipase 20,880 Units/sodium bicarbonate 650 mg ([employer] CLOG DESTROYER) PRN (On Call from Rx), phenoL PRN       Neuro exam:   Mental status: Drowsy, opening eyes with sternal rub  Speech: Dysarthria   Cranial Nerves:    Normal Abnormal   II Pupils reactive, visual fields normal    III, IV, VI  Patient does not move eyes to the left or right, vertical eye movements intact   V Sensation nml V1-V3    VII  Left upper and lower facial weakness   VIII nml to conversation    IX, X +strong cough    XI Equal shoulder shrug    XII Tongue midline    Muscle/motor: 5/5 in RUE and RLE, 5/5 in LLE, 4/5 in LUE  Sensation: Normal in bilateral upper and lower extremities, no abnormalities    Coordination: Not assessed due to patient fatigue    Gait and Sation: Not assessed    Laboratory Review:  No results found for: PHART, PCO2A, PO2ART, HCO3A, BASEEXA, BASEDEFA, O2SATACAL  Lab Results   Component Value Date    HGB 13.4 (L) 10/28/2023    HCT 38.7 (L) 10/28/2023    PLTCT 289 10/28/2023    WBC 8.90 10/28/2023    NEUT 82.5 (H) 10/22/2023    ANC 10.90 (H) 10/22/2023     Lab Results   Component Value Date    NA 139 10/28/2023    K 3.6 10/28/2023    CL 105 10/28/2023    CO2 23 10/28/2023    GAP 11 10/28/2023    BUN 16 10/28/2023    CR 0.76 10/28/2023    GLU 227 (H) 10/28/2023    CA 9.0 10/28/2023    MG 1.8 10/21/2023    LACTIC 0.9 10/24/2023    TOTPROT 7.3 10/22/2023    AST 19 10/22/2023    ALT 21 10/22/2023    ALKPHOS 71 10/22/2023     Lab Results   Component Value Date    TNI 0.00 02/22/2019     Lab Results   Component Value Date    PTT 27.6 10/17/2023    INR 1.0 (L) 10/17/2023    DIMER 254 02/22/2019     Lab Results   Component Value Date    TSH 0.40 06/05/2023     Lab Results   Component Value Date    CHOL 216 (H) 10/17/2023    TRIG 219 (H) 10/17/2023    HDL 36 (L) 10/17/2023    LDL 161 (H) 10/17/2023    VLDL 43.8 10/17/2023     No results found for: VANPK, VANTR  No results found for: CYCLOSPOR, TACROLIMUS, FREEPHENY     Point of Care Testing:  (Last 24 hours):  FSBS (Manual): (!) 283 (notified rn)  Glucose: (!) 227  POC Glucose (Download): (!) 192    Radiology and Other Diagnostics Reviewed

## 2023-10-28 NOTE — H&P (View-Only)
 Physical Medicine & Rehabilitation History & Physical Note    Date of Service:  10/28/2023  Kevin Obrien is a 73 y.o. male.     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 male admitted to [facility] on 10/28/2023 due to the need for intense rehabilitation services for stroke diagnosis leading to the following impairments:    Impairments: poor activity tolerance, weakness, and vision  Activity Limitations:  toileting, transfers, ambulation, stairs, and memory  Participation Restrictions: unable to return home safely    Rehabilitation Plan  Patient will be admitted to inpatient rehabilitation for comprehensive therapies to include Physical therapy, Occupational therapy, and Speech therapy  Rehabilitation Prognosis: Fair to good  Tolerance for three hours of therapy a day: Good    Goals/Barriers/Facilitators  Family / Patient Goals: return home with family assistance  Mobility Goals: Overall goal is Supervision or touching assistance and Physical Therapy will evaluate and treat ambulation/wheelchair use and bed transfers  Activities of Daily Living (ADLs) Goals: Overall goal is Supervision or touching assistance and Occupational therapy will evaluate and treat basic Activities of Daily Living  Cognition / Communication Goals: Overall goal is Supervision or touching assistance and Speech therapy will evaluate and treat cognition and communication deficits and assess for safe swallow      Barriers & Interventions:   Caregiver Apprehension: Arrange caregiver support and discuss barriers and patient progress with caregivers when appropriate.  High Burden of Care: Initiate interdisciplinary rehabilitation to improve functional independence and reduce burden of care.  Medication Education:  Pharmacist and nursing staff to provide education to patient and family regarding medication side effects, special precautions, and safe administration.  Facilitators: good home setup, good family / social support, patient motivation, improving strength / endurance, and improving medical condition    Current Medical Problems/Risks of Medical Complications/Management    Acute Right Pontomedullary Ischemic Stroke  Proximal Right Intracranial Vertebral Artery Occlusion  Diffuse Intracranial Atherosclerosis  Bilateral Horizontal Gaze Palsy  History of Forced Left Gaze Deviation  Left Facial Palsy  Impairments: left hemiparesis, expressive aphasia, oropharyngeal dysphagia, right facial weakness, left facial droop with dysarthria, diplopia, concern for attention/cognitive impairments, LUE ataxia, neurogenic bowel, neurogenic bladder  Impaired mobility and ADL, impaired swallow and cognition/communication  Presented with  acute facial droop, left gaze and dysarthria leading to stroke activation found to have BP 210/85, CTA revealing significant calcified atherosclerosis and diffuse intracranial vascular stenoses. He did not receive TNK due to LKW outside window.  Imaging findings   Repeat MRI Brain 05/18  1.  Redemonstration of evolving acute-subacute pontomedullary infarcts, with increased conspicuity of ischemia in the posterior brain stem since the comparison examination.   2.  Punctate focus of right lateral precentral gyral cortical enhancement without corresponding signal abnormality on additional sequences, potentially reflecting incidental vascular enhancement. Tiny subacute enhancing infarct could appear similar.   3.  Stable mild generalized cerebral and cerebellar volume loss, moderate chronic microvascular ischemic cerebral white matter changes, and multiple superimposed scattered superimposed chronic infarcts.   Brain MRA 05/18  1.  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   2.  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 No   vessel wall edema or enhancement to suggest active arterial wall inflammation.   ECHO findings- EF of 65%, bubble study shows a Right to Left Shunting consistent with a PFO, no thrombus/valvular vegetation, LA index WNL   Suspected etiology: Large artery atherosclerosis                > consult PT/OT/SLP and neuropsychology to evaluate and treat   > discuss with patient/family use of SSRI for motor recovery and post-stroke depression- pt is already on sertraline 50mg    > consider discussion with patient/family regarding the use of acetylcholinesterase inhibitor for aphasia recovery and post-stroke cognitive deficit   > Consider orthotics consult following therapy evaluation   > Sleep optimization: melatonin 5mg  qhs, previously on seroquel nightly   > follow up: Neurology, PM&R, Cardiology,      Secondary Stroke Risk Optimization  > Antiplatelet/Anticoagulation: DAPT with aspirin 81mg  daily and clopidogrel 75mg  daily for 90 days followed by aspirin 81mg  indefinitely  > Blood Pressure Goal: <160 during acute hospitalization, long term goal of < 130/80               - Current antihypertensive medications: amlodipine 10mg  daily, lisinopril 20mg  daily  > Lipids: LDL is 161, with goal of <70: rosuvastatin 40mg  daily (per Cardiology recs)  > Blood Glucose: Hgb A1c is 11.7%, with goal of <7  > Tobacco abstinence goal    At risk for Spasticity   > consider trial of baclofen if indicated, discuss potential benefits and side effects   > daily ROM with therapy     Dysphagia s/p PEG tube placement  Moderate Malnutrition  - PEG tube placement 5/15   > Dietician consult, appreciate tube feed recommendations- transition from continuous to bolus feeds 5/19   Recommend Jevity 1.5 bolus feeds, 5 cartons/day + 1 prosource pkt.     Recommend 1/2 carton per bolus for first 1-2 feeds, then proceed to goal regimen.    This will provide 1857kcal, 96g protein, free H2O.    Recommend 30mL free H2O flush before and after each bolus + 100mL q4h to meet fluid needs.   To help with loose stools, before interrupting EN support:    Avoid sorbitol containing medications such as liquid tylenol as these can make diarrhea worse; recommend switching to crushable tablet form.    Consider probiotic.    If loose stools persist, recommend adding 1-2 packets of nutrisource fiber per day.     Aspiration Pneumonia  S/p  AHRF  - increased O2 req overnight 5/13 with elevated procal, tachycardia, leukocytosis  - CXR 5/13 notable for mild basal opacities, favored to represent atelectasis  - s/p vanc x1 and zosyn x3 days  - sputum cx NGTD   > Augmentin to complete total 7 day course of antibiotics through 5/20 AM    Left Bell's Palsy   > Alternate eye patches; eyepatch for the L eye overnight, artificial tears TID daily in the setting of left facial palsy    Pseudobulbar Affect  > Per Neurology: Consider starting Dextromethorphan-Quinidine in the outpatient setting (unfortunately unavailble on the formulary inpatient) for Pseudobulbar affect, will start Fluoxetine 20mg  in the meantime --> instead was started on Sertraline 50mg , will continue while in rehab  > Neurology clinic follow up on dc    Oral Thrush   > oral care TID   > after speech evaluation, consider addition of nystatin swish and swallow vs spit if safe    Bowel Management  At risk for Neurogenic Bowel  Loose Stools  - Goal for a daily BM or every 24-48 hours.   - c diff negative 5/18  > adjust bowel regimen PRN   > loperamide PRN    Bladder Management  Neurogenic Bladder with Retention   > Monitor urinary retention per unit protocol. If unable to void or PVRs >300cc perform clean ISC (intermittent straight catheterization) for volumes > . Stop checking BVIs if 3 consecutive volumes <150cc.   > Consider foley placement    Hypertension  LVOT Obstruction  PFO  CAD s/p PCI   > metoprolol 25mg  BID   > amlodipine 10mg  daily, lisinopril 20mg  daily   > 30 day MCOT    > Follow up with outpatient Cardiology     Diabetes Mellitus   > Endocrinology consult for ongoing discharge regimen planning    Depression/Mood screen :          > neuropsychology to evaluate and treat for adjustment to new deficits          > Encouragement and support    Wound:               BMI: Body mass index is 21.9 kg/m?SABRA    Mental Health: consult neuropsychology to provide support / counseling    DVT Prophylaxis: Lovenox    History of Present Illness:    Hospital Course: Mr. Dawson Albers is a 73 y.o. male and  has a past medical history of Accidental fall, Angina pectoris, Arthritis, BPH (benign prostatic hyperplasia), Cataract, Constipation, Coronary artery disease, DM (diabetes mellitus) (CMS-HCC), ED (erectile dysfunction), Heart attack (CMS-HCC), Hyperlipidemia, Hypertension, Joint pain, Neuropathy, and Shingles. The patient presented to [facility] on 10[facility]17[facility]2023 with left gaze deviation, facial weakness and dysarthria.  CT head was negative for acute changes.  CTA head and neck without LVO.  Patient was not a TNK or EVT candidate. 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 Neurology recommend DAPT with aspirin and Plavix.  He is currently on permissive hypertension. The patient's hospital course is functionally complicated by impaired mobility and ADLs.       Patient has been working with physical and occupational therapy since 10-18-2023 and has been making functional gains. Patient is anticipated to return to home at the supervision level of care. Patient has also been working with speech language pathology on dysphagia; however will also receive formal cognitive evaluation to assess for baseline status and any new cognitive/linguistic deficits.  The patient was admitted to [facility] on 10/28/2023. Prior level of function was independent with mobility and ADLs. Current level of function is significantly impaired, requiring moderate assist for bed mobility, moderate assist for transfer with roller walker, total assist for hygiene, balance impairment, decreased activity tolerance, decreased strength. Pt lives in a home with his girlfriend who he states would be able to help him. The patient was seen on admission to [facility] and endorsed difficulty speaking, diarrhea, trouble swallowing. Pt denied chest pain, nausea, abdominal pain, burning or pain with urination, constipation.      Past Medical History  Past Medical History:    Accidental fall    Angina pectoris    Arthritis    BPH (benign prostatic hyperplasia)    Cataract    Constipation    Coronary artery disease    DM (diabetes mellitus) (CMS-HCC)    ED (erectile dysfunction)    Heart attack (CMS-HCC)    Hyperlipidemia    Hypertension    Joint pain    Neuropathy    Shingles       Past Surgical History  Surgical History:   Procedure Laterality Date    CORONARY STENT PLACEMENT  2016    HX CHOLECYSTECTOMY  09/2015    COLONOSCOPY N/A 04/20/2016    Performed by Rogers Kipper, MD at [facility]    ESOPHAGOGASTRODUODENOSCOPY N/A 04/20/2016    Performed by Rogers Kipper, MD at [facility]    ESOPHAGOGASTRODUODENOSCOPY BIOPSY  04/20/2016    Performed by Rogers Kipper, MD at [facility]    UREA BREATH TEST N/A 11/13/2016    Performed by Ledora Catalina, MD at [facility] ENDO       Family\Social History  Social History     Socioeconomic History    Marital status: Separated    Number of children: 3   Tobacco Use    Smoking status: Former     Current packs/day: 0.00     Average packs/day: 2.0 packs/day for 10.0 years (20.0 ttl pk-yrs)     Types: Cigarettes     Start date: 04/11/1961     Quit date: 04/12/1971     Years since quitting: 52.5    Smokeless tobacco: Never    Tobacco comments:     Former smoker/quit years ago   Vaping Use    Vaping status: Never Used   Substance and Sexual Activity    Alcohol use: Not Currently    Drug use: Never    Sexual activity: Not Currently     Partners: Female     Birth control/protection: None             Family History   Problem Relation Name Age of Onset    Diabetes Mother AMPARO CUBIAS     Diabetes Father JOSE Diegel     Cancer-Lung Father JOSE Blish     Melanoma Neg Hx      Cataract Neg Hx      Glaucoma Neg Hx      Macular Degen Neg Hx         Medications:  [START ON 10/29/2023] amLODIPine (NORVASC) tablet 10 mg, 10 mg, Per NG tube, QDAY  amoxicillin-potassium clavulanate (AUGMENTIN) tablet 875 mg, 875 mg, Feeding Tube, BID w/meals  artificial tears (PF) single dose ophthalmic solution 1 drop, 1 drop, Left Eye, TID  [START ON 10/29/2023] aspirin chewable tablet 81 mg, 81 mg, Per NG tube, QDAY  [START ON 10/29/2023] clopiDOGreL (PLAVIX) tablet 75 mg, 75 mg, Per  NG tube, QDAY  enoxaparin (LOVENOX) syringe 40 mg, 40 mg, Subcutaneous, QDAY(21)  insulin aspart (U-100) (NOVOLOG FLEXPEN U-100 INSULIN) injection PEN 0-12 Units, 0-12 Units, Subcutaneous, Q6H  insulin glargine (LANTUS SOLOSTAR U-100 INSULIN) injection PEN 18 Units, 18 Units, Subcutaneous, QDAY  insulin regular human (NOVOLIN R FLEXPEN) injection PEN 20 Units, 20 Units, Subcutaneous, Q6H  [START ON 10/29/2023] lisinopriL (ZESTRIL) tablet 20 mg, 20 mg, Feeding Tube, QDAY  melatonin tablet 5 mg, 5 mg, Oral, QHS  metoprolol tartrate tablet 25 mg, 25 mg, Feeding Tube, BID  petrolatum (STYE) ophthalmic ointment 0.25 inch, 0.25 inch, Both Eyes, TID  [START ON 10/29/2023] rosuvastatin (CRESTOR) tablet 40 mg, 40 mg, Feeding Tube, QDAY  [START ON 10/29/2023] sertraline (ZOLOFT) oral conc 50 mg, 50 mg, Feeding Tube, QDAY  [START ON 10/29/2023] thiamine mononitrate (vit B1) tablet 100 mg, 100 mg, Per NG tube, QDAY        PRN Medications:  acetaminophen (OFIRMEV) IV Q8H PRN, bisacodyL QDAY PRN, dextrose 50% (D50) IV PRN, diclofenac sodium QID PRN, lidocaine hcl PRN, loperamide  (IMODIUM) oral solution TID PRN, pancrelipase 20,880 Units/sodium bicarbonate 650 mg ([place] CLOG DESTROYER) PRN (On Call from Rx), phenoL PRN    Allergies:    Allergies   Allergen Reactions    Seasonal Allergies RHINITIS       Prior Level of Function:    Self-Care/ADLs: Independent with ADL and household mobility with device   Mobility: Independent with ADL and household mobility with device   Work/Personal Responsibilities/Hobbies:  n/a    Home Environment  No data recorded  No data recorded  Type of Home: House (10/28/2023 12:25 PM)    Entry Stairs: 2 (10/28/2023 12:25 PM)     In-Home Stairs: Able to live on main level (10/28/2023 12:25 PM)    No data recorded  No data recorded    Support System:  Spouse / Significant Other  Other DME:  SPC    Current Level of Function:    Physical Therapy: 10-27-2023    Bed Mobility/Transfer  Bed Mobility: Supine to Sit: Moderate Assist;Verbal Cues;Head of Bed Elevated;Use of Rail;Requires Extra Time;Safety Considerations;Assist with R LE;Assist with Trunk  Bed Mobility: Sit to Supine: Minimal Assist;Verbal Cues;Bed Flat;Assist with L LE;Requires Extra Time;Safety Considerations  Transfer Type: Sit to/from Stand  Transfer: Assistance Level: To/From;Bed;Moderate Assist  Transfer: Assistive Device: Nurse, adult  Transfers: Type Of Assistance: Verbal Cues;Elevated Bed;For Balance;For Strength Deficit;For Safety Considerations;Requires Extra Time (Assist with hand positioning)  End Of Activity Status: In Bed;Nursing Notified;Instructed Patient to Use Call Light;Instructed Patient to Request Assist with Mobility (bed alarm active, all needs met)  Comments: Patient with multiple BMs during session, patient completed multiple sit to stands and stood at [facility] for posterior hygeine. Unable to complete gait due to bowel frequency.  Assessment/Progress  Impaired Mobility Due To: Impaired Balance;Decreased Activity Tolerance;Decreased Strength  Impaired Strength Due To: Medical Status Limitation;Decreased Activity Tolerance  Assessment/Progress: Should Improve w/ Continued PT     Occupational Therapy: 10-25-2023    Bed Mobility/Transfer  Bed Mobility: Supine to Sit: Moderate Assist;Assist with Trunk;Assist with B LE;Head of Bed Elevated;No Rail  Transfer Type: Sit to/from [name]: Assistance Level: From;Bed;To;Commode;Minimal Assist;x2 People  Transfer: Assistive Device: Doctor, general practice  Transfers: Type Of Assistance: Verbal Cues;For Balance;For Strength Deficit;For Safety Considerations;Requires Extra Time  Other Transfer Type: Mechanical Lift  Other Transfer: Assistance Level: From;Commode;To;Bed Side Chair;Moderate Assist;x2 People  Other Transfer: Assistive Device: Management consultant Jayson stedy)  Other Transfer: Type  Of Assistance: Verbal Cues;For Balance;For Strength Deficit;For Safety Considerations;Requires Extra Time  End Of Activity Status: Up in Chair;Nursing Notified;Instructed Patient to Request Assist with Mobility;Instructed Patient to Use Call Light (alarm activated)  Comments: Pt assited to commode per pt request for BM; total assist for hygiene. Camie stedy trialed to transfer back to chair. Pt requires increased assist for lateral stability and sequencing with use of Sara stedy. Continued use of [place] recommended for transfers with nursing staff.     Gait  Comments: Deferred this session due to time constraints after pt uses commode.        Speech Therapy: 10-18-2023  EVALUATION SUMMARY   A timeout procedure was completed prior to assessment confirming two patient identifiers.   Reviewed nature of procedure and clarified possible dietary restrictions before PO trials provided.      Fiberoptic endoscopic evaluation of swallowing (FEES) completed.  Utilized AMBU Single patient use scope. Lot #: [id number]  Overall Impression: Moderate-severe oropharyngeal dysphagia  Sources of Dysphagia: impaired motor processing/incoordination w/ a component of weakness     Swallow Recommendations (10/21/2023)  Treatment Summary:   Dysphagia therapy completed.     Moderate-severe oropharyngeal dysphagia  Sources of Dysphagia:  impaired motor processing/incoordination w/ a component of weakness   Persons Educated: Patient/Family  Prognosis: Fair     Swallow Recommendations  PO: Ice chips only w/ excellent oral care to minimize potential of aspirating oral bacteria   NPO: Consider short term non-oral nutrition  Medications: non-oral   Supervision: yes when taking ice  Dysphagia Management: dysphagia tx & anticpate need for repeat instrumental swallow eval given time/videoswallow study   Oral Hygiene: Complete oral care to minimize the risk of aspirating oral bacteria    Review of Systems:  As per HPI. All other systems reviewed and are negative.    Physical Exam:     BP: 124/55 (05/19 1324)  Temp: 36.7 ?C (98 ?F) (05/19 1324)  Pulse: 72 (05/19 1324)  Respirations: 18 PER MINUTE (05/19 1324)  SpO2: 100 % (05/19 1324)  O2 Device: None (Room air) (05/19 1324)    Body mass index is 21.9 kg/m?.    Gen: Alert, lying in bed comfortably in no Acute Distress  HEENT: NCAT; thick white coating over tongue  Neck: Supple  Heart: Warm and well perfused in all four extreities  Lungs: Non labored breathing on room air  Abdomen: Soft, non-tender, non-distended; +PEG tube in place  GU: -Foley  Skin: no rash or lesions on exposed skin   Ext: No lower extremity unilateral swelling  MS:   Root Right Left   Shoulder Abduction C5 5- 4   Elbow Flexion C5 5 4   Elbow Extension C7 5 4   Wrist Extension C6 5 4-   Finger Flexion C8 5 4   Hip Flexion L2 4+/5 4-/5   Knee Extension L3 5 4+   Dorsiflexion L4 5 5   Plantarflexion S1 5 5     Neuro:  Cranial Nerves Right facial weakness, left facial paralysis, bilateral horizontal gaze palsy   Finger to Nose Ataxic on the left    Upper Extremity Tone Normal   Lower Extremity Tone Normal   Upper Extremity Sensation Intact to light touch bilaterally   Lower Extremity Sensation Intact to light touch bilaterally   Memory/Cognition/Speech Dysarthria. Alert and oriented to person, place, city, situation (in rehab for stroke)     Basic Metabolic Profile    Lab Results   Component Value Date/Time  NA 139 10/28/2023 05:08 AM    K 3.6 10/28/2023 05:08 AM    CA 9.0 10/28/2023 05:08 AM    CL 105 10/28/2023 05:08 AM    CO2 23 10/28/2023 05:08 AM    GAP 11 10/28/2023 05:08 AM    Lab Results   Component Value Date/Time    BUN 16 10/28/2023 05:08 AM    CR 0.76 10/28/2023 05:08 AM    GLU 227 (H) 10/28/2023 05:08 AM      CBC w diff    Lab Results   Component Value Date/Time    WBC 8.90 10/28/2023 05:08 AM    RBC 4.48 10/28/2023 05:08 AM    HGB 13.4 (L) 10/28/2023 05:08 AM    HCT 38.7 (L) 10/28/2023 05:08 AM    MCV 86.3 10/28/2023 05:08 AM    MCH 29.9 10/28/2023 05:08 AM    RDW 12.6 10/28/2023 05:08 AM    PLTCT 289 10/28/2023 05:08 AM    MPV 8.8 10/28/2023 05:08 AM    Lab Results   Component Value Date/Time    NEUT 82.5 (H) 10/22/2023 07:01 PM    ANC 10.90 (H) 10/22/2023 07:01 PM    LYMA 11.4 (L) 10/22/2023 07:01 PM    ALC 1.50 10/22/2023 07:01 PM    MONA 5.6 10/22/2023 07:01 PM    AMC 0.70 10/22/2023 07:01 PM    EOSA 0.2 10/22/2023 07:01 PM    AEC 0.00 10/22/2023 07:01 PM    BASA 0.3 10/22/2023 07:01 PM    ABC 0.00 10/22/2023 07:01 PM        Radiology:  Reviewed.      Corean CHRISTELLA Brewster, DO  PM&R PGY-2

## 2023-10-28 NOTE — Rehab Pre-Admission Screening
 Physical Medicine & Rehabilitation Pre-Admission Screening    [facility] Arnott is a 73 y.o.  male.     DOB: Apr 13, 1951                  MRN#:  [id number]  Primary Insurance: [employer] MEDICARE  Secondary Insurance: [place] MEDICAID  Financial Class: Medicare Repl  Date of Hospital Admission:  10-17-2023  Date of Expected Rehab Admission:  10-28-2023  Precautions:  Fall  Weight bearing Precautions:  [place] Hospital Course:       Mr. Quincy Boy is a 73 y.o. male and  has a past medical history of Accidental fall, Angina pectoris, Arthritis, BPH (benign prostatic hyperplasia), Cataract, Constipation, Coronary artery disease, DM (diabetes mellitus) (CMS-HCC), ED (erectile dysfunction), Heart attack (CMS-HCC), Hyperlipidemia, Hypertension, Joint pain, Neuropathy, and Shingles. The patient presented to [facility] on 10/17/2023 with left gaze deviation, facial weakness and dysarthria.  CT head was negative for acute changes.  CTA head and neck without LVO.  Patient was not a TNK or EVT candidate. 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 Neurology recommend DAPT with aspirin and Plavix.  He is currently on permissive hypertension. The patient's hospital course is functionally complicated by impaired mobility and ADLs.       Patient has been working with physical and occupational therapy since 10-18-2023 and has been making functional gains. Patient is anticipated to return to home at the supervision level of care. Patient has also been working with speech language pathology on dysphagia; however will also receive formal cognitive evaluation to assess for baseline status and any new cognitive/linguistic deficits.          Prior Level of Function        Self-Care/ADLs: Independent with ADL and household mobility with device   Mobility: Independent with ADL and household mobility with device   Work/Personal Responsibilities/Hobbies:  n/a     Home Environment:    Type of Home: House (10/18/2023 12:00 PM)   Entry Stairs: 2 (10/18/2023 12:00 PM)   In-Home Stairs: Able to live on main level (10/18/2023 12:00 PM)         Support System:  Spouse / Significant Other  Other DME:  SPC       Current Level of Function    Physical Therapy: 10-27-2023    Bed Mobility/Transfer  Bed Mobility: Supine to Sit: Moderate Assist;Verbal Cues;Head of Bed Elevated;Use of Rail;Requires Extra Time;Safety Considerations;Assist with R LE;Assist with Trunk  Bed Mobility: Sit to Supine: Minimal Assist;Verbal Cues;Bed Flat;Assist with L LE;Requires Extra Time;Safety Considerations  Transfer Type: Sit to/from Stand  Transfer: Assistance Level: To/From;Bed;Moderate Assist  Transfer: Assistive Device: Nurse, adult  Transfers: Type Of Assistance: Verbal Cues;Elevated Bed;For Balance;For Strength Deficit;For Safety Considerations;Requires Extra Time (Assist with hand positioning)  End Of Activity Status: In Bed;Nursing Notified;Instructed Patient to Use Call Light;Instructed Patient to Request Assist with Mobility (bed alarm active, all needs met)  Comments: Patient with multiple BMs during session, patient completed multiple sit to stands and stood at [facility] for posterior hygeine. Unable to complete gait due to bowel frequency.  Assessment/Progress  Impaired Mobility Due To: Impaired Balance;Decreased Activity Tolerance;Decreased Strength  Impaired Strength Due To: Medical Status Limitation;Decreased Activity Tolerance  Assessment/Progress: Should Improve w/ Continued PT     Occupational Therapy: 10-25-2023    Bed Mobility/Transfer  Bed Mobility: Supine to Sit: Moderate Assist;Assist with Trunk;Assist with B LE;Head of Bed Elevated;No Visual merchandiser  Type: Sit to/from [employer]  Transfer: Assistance Level: From;Bed;To;Commode;Minimal Assist;x2 People  Transfer: Assistive Device: Doctor, general practice  Transfers: Type Of Assistance: Verbal Cues;For Balance;For Strength Deficit;For Safety Considerations;Requires Extra Time  Other Transfer Type: Mechanical Lift  Other Transfer: Assistance Level: From;Commode;To;Bed Side Chair;Moderate Assist;x2 People  Other Transfer: Assistive Device: Management consultant Jayson stedy)  Other Transfer: Type Of Assistance: Verbal Cues;For Balance;For Strength Deficit;For Safety Considerations;Requires Extra Time  End Of Activity Status: Up in Chair;Nursing Notified;Instructed Patient to Request Assist with Mobility;Instructed Patient to Use Call Light (alarm activated)  Comments: Pt assited to commode per pt request for BM; total assist for hygiene. Camie stedy trialed to transfer back to chair. Pt requires increased assist for lateral stability and sequencing with use of Sara stedy. Continued use of [place] recommended for transfers with nursing staff.     Gait  Comments: Deferred this session due to time constraints after pt uses commode.        Speech Therapy: 10-18-2023  EVALUATION SUMMARY   A timeout procedure was completed prior to assessment confirming two patient identifiers.   Reviewed nature of procedure and clarified possible dietary restrictions before PO trials provided.      Fiberoptic endoscopic evaluation of swallowing (FEES) completed.  Utilized AMBU Single patient use scope. Lot #: [id number]  Overall Impression: Moderate-severe oropharyngeal dysphagia  Sources of Dysphagia: impaired motor processing/incoordination w/ a component of weakness     Swallow Recommendations (10/21/2023)  Treatment Summary:   Dysphagia therapy completed.     Moderate-severe oropharyngeal dysphagia  Sources of Dysphagia:  impaired motor processing/incoordination w/ a component of weakness   Persons Educated: Patient/Family  Prognosis: Fair     Swallow Recommendations  PO: Ice chips only w/ excellent oral care to minimize potential of aspirating oral bacteria   NPO: Consider short term non-oral nutrition  Medications: non-oral   Supervision: yes when taking ice  Dysphagia Management: dysphagia tx & anticpate need for repeat instrumental swallow eval given time/videoswallow study   Oral Hygiene: Complete oral care to minimize the risk of aspirating oral bacteria      Rehabilitation Plan    Rehab Diagnosis and conditions that require Rehabilitation:      stroke   Cognitive impairment, Dysphagia, Pain, and Weakness       Active Comorbidities/Risk of Medical Complications:     Stroke: 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 Neurology recommend DAPT with aspirin and Plavix.  Cognitive Impairment: Patient with cognitive impairment.  May need 24 hour supervision upon discharge home.  Dysphagia: Patient with moderate to severe oropharyngeal dysphagia. Currently tolerating corpak with TF's plus ice chips only.  Dysphagia puts them at risk for aspiration and pneumonia.  Aspiration precautions in place.  Falls:  Patient will be placed close to the nurse's station for closer monitoring. Staff will round on the patient frequently to assess for any needs, such as using the restroom to help prevent falls as this increases the patient's risk for morbidity/mortality and 30 day readmission to the hospital rate.   Diabetes: Patient is at risk for hyper/hypoglycemia during aggressive mobilization with therapies, and will need daily physician monitoring and adjustment of medications.  Patient may benefit from ongoing education from diabetes nurse educator for healthy lifestyle modifications.  Hyperlipidemia: Will continue to monitor and manage, and adjust medications as needed.  Hypertension: Patient is at risk for hyper/hypotension during aggressive mobilization program with therapies, and requires daily physician monitoring and adjustment of medications.  Will continue to monitor  and manage for optimal blood pressure control and resume home medications as able.  Leukocytosis: Patient is at risk for SIRS, sepsis and needs daily physician monitoring and management of acute illness. Will continue to monitor and manage.  Risk for Pain: There is a risk of uncontrolled pain, risk of failure to participate in therapies, and risk of sedation due to pain medications.  This will require daily medication adjustments to find the balance between meaningful participation in therapies and pain control, avoid the potential for addiction, while facilitating the rehabilitation for their condition. Will continue to monitor and adjust medication regimen in order to maximize pain relief.  Risk for Thrombosis: Patient has Lovenox, sequential compression devices ordered.  Patient will be encouraged to ambulate frequently with the assistance of health care provider.  Risk for Stroke: Will monitor for acute neurological changes and manage accordingly, as patient is at risk for repeat stroke, hemorrhagic conversion, and seizures.            Patient will receive Physical therapy, Occupational therapy, and Speech therapy each 60 minutes a day, 5 days a week for a total of 3 hours daily (minimum) for the duration of the rehabilitation stay within an interdisciplinary rehabilitation program with case manager/social worker, dietician, neuropsychologist, rehab nursing and PM&R oversight.     Rehabilitation Prognosis: Fair to good  Medical Prognosis: The patient is deemed medically stable to tolerate, benefit from, and participate in IRF level services.  Tolerance for three hours of therapy a day: Good. Patient has participated well with therapies since 10-18-2023 in the acute care setting and is anticipated to tolerate therapy as required.      Goals/Barriers/Facilitators  Family / Patient Goals: return home with family assistance  Mobility Goals: Overall goal is Supervision or touching assistance and Physical Therapy will evaluate and treat ambulation/wheelchair use and bed transfers  Activities of Daily Living (ADLs) Goals: Overall goal is Supervision or touching assistance and Occupational therapy will evaluate and treat basic Activities of Daily Living  Cognition / Communication Goals: Overall goal is Supervision or touching assistance and Speech therapy will evaluate and treat cognition and communication deficits and assess for safe swallow         Barriers & Interventions:   Caregiver Apprehension: Arrange caregiver support and discuss barriers and patient progress with caregivers when appropriate.  High Burden of Care: Initiate interdisciplinary rehabilitation to improve functional independence and reduce burden of care.  Medication Education:  Pharmacist and nursing staff to provide education to patient and family regarding medication side effects, special precautions, and safe administration.  Facilitators: good home setup, good family / social support, patient motivation, improving strength / endurance, and improving medical condition     Discharge Planning  Expected Length of Stay  21  day(s)  Expected Discharge Disposition Home          Ronal Jenkins Canard, RN, BSN

## 2023-10-28 NOTE — Discharge Instructions - Pharmacy
 Discharge Summary      Name: Kevin Obrien  Medical Record Number: [id number]        Account Number:  [id number]  Date Of Birth:  1950-10-04                         Age:  73 y.o.  Admit date:  10/17/2023                     Discharge date: 10/28/2023      Discharge Attending:  Dr. Alonso Jan  Discharge Summary Completed By: Providence Brow, MD    Service: Neurology Stroke    Reason for hospitalization:  Ischemic stroke (CMS-HCC) [I63.9]    Primary Discharge Diagnosis:   Ischemic stroke (CMS-HCC)    Hospital Diagnoses:  Hospital Problems        Active Problems    * (Principal) Ischemic stroke (CMS-HCC)    Diabetes mellitus (CMS-HCC)    Neuropathy    Peripheral artery disease    Essential tremor    Poorly controlled diabetes mellitus (CMS-HCC)    Gaze preference    Moderate malnutrition     Present on Admission:   Ischemic stroke (CMS-HCC)   Diabetes mellitus (CMS-HCC)   Essential tremor   Neuropathy   Poorly controlled diabetes mellitus (CMS-HCC)   Peripheral artery disease   Gaze preference   Moderate malnutrition        Significant Past Medical History        Accidental fall  Angina pectoris  Arthritis  BPH (benign prostatic hyperplasia)  Cataract  Constipation  Coronary artery disease  DM (diabetes mellitus) (CMS-HCC)  ED (erectile dysfunction)  Heart attack (CMS-HCC)  Hyperlipidemia  Hypertension  Joint pain  Neuropathy  Shingles    Allergies   Seasonal allergies    Brief Hospital Course     [facility] Aceituno is a 73 y.o. male with hypertension, diabetes and prior CAD s/p PCI (~10 years ago) presented with acute right facial droop, left gaze and dysarthria and was stroke activated. Initial NIH was 6, and the LKW was the night before. His BP at arrival was 210/85. CT head was negative for acute changes, CTA H&N showed no LVO but significant calcified athero and diffuse intracranial vascular stenoses.  He did not receive TNK due to LKW time and no EVT due to no LVO. He was loaded with Aspirin and Plavix with plan for DAPT for 3 months (and then Aspirin monotherapy). A1c was 11.7%, endocrine consulted and has been managing Insulin inpatient. Lipid panel showed LDL 161, for which he is on Crestor.    ECHO showed a PFO with shunt, but ultrasound of the lower extremities was without DVT.   Initial MRI brain obtained on 05/09 showed a right sided pontomedullary brainstem infarct. Repeat brain MRI on 05/18 showed increased ischemic involving the dorsal brainstem. 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 Both these findings were already present on the CTA obtained during the initial stroke activation.    In light of the results of the MRI, all his clinical symptoms (dysphagia, facial weakness, gaze deviation) can be explained with the stroke location. Due to the amount of dysphagia, he has been on Corpak and we were unable to remove it before discharge to rehab. He will transition from continuous feeds to bolus feeds on 05/19.    The patient has struggles with loose stools. C. Diff  test is negative so the most likely cause is the tube feeds.    On 05/18, we noticed new neuro findings consistent with left Bell's palsy. This prompted the second brain MRI. For Bell's palsy, we recommend wearing an eye patch at night + using eye drops and eye lubricant to avoid dry eyes.    Items Needing Follow Up   Pending items or areas that need to be addressed at follow up: Tube feeds.    Pending Labs and Follow Up Radiology    Pending labs and/or radiology review at this time of discharge are listed below: Please note- any labs with collected status will not have a result; if this area is blank, there are no items for review.         Medications      Medication List      START taking these medications     amLODIPine 10 mg tablet; Commonly known as: NORVASC; Dose: 10 mg; one   tablet by Per NG tube route daily. Indications: high blood pressure; For:   high blood pressure; Refills: 0   amoxicillin-potassium clavulanate 875/125 mg tablet; Commonly known as:   AUGMENTIN; Dose: 1 tablet; Take one tablet via feeding tube twice daily   with meals for 4 days. Indications: a lower respiratory infection; For: a   lower respiratory infection; Refills: 0   artificial tears (PF) single dose ophthalmic solution; Dose: 1 drop;   Apply one drop to left eye as directed three times daily. Indications: dry   eye; For: dry eye; Refills: 0   aspirin 81 mg chewable tablet; Dose: 81 mg; Take one tablet via feeding   tube daily. Indications: stroke prevention; For: stroke prevention;   Refills: 0   clopiDOGreL 75 mg tablet; Commonly known as: PLAVIX; Dose: 75 mg; one   tablet by Per NG tube route daily for 82 days. Indications: prevention for   a blood clot going to the brain; For: prevention for a blood clot going to   the brain; Refills: 0   insulin aspart (U-100) 100 unit/mL (3 mL) PEN; Commonly known as:   NOVOLOG FLEXPEN U-100 INSULIN; Dose: 0-12 Units; Inject zero Units to   twelve Units under the skin every 6 hours. Correction Factor if: BS   181-220=2 units, BS 221-260=4 units, BS 261-300=6 units, BS 301-350=8   units, BS 351-400=10 units, BS >400=12 units  Indications: type 2 diabetes   mellitus; For: type 2 diabetes mellitus; Refills: 0   insulin glargine 100 unit/mL (3 mL) subcutaneous PEN; Commonly known as:   LANTUS SOLOSTAR U-100 INSULIN; Dose: 18 Units; Doctor's comments: The   pharmacist may select Lantus Solorstar or Darilyn Edin based on what   is cheaper for the patient.; Inject eighteen Units under the skin daily.   Indications: type 2 diabetes mellitus; For: type 2 diabetes mellitus;   Refills: 0   insulin regular human 100 unit/mL (3 mL) injection PEN; Commonly known   as: NOVOLIN R FLEXPEN; Dose: 20 Units; Inject twenty Units under the skin   every 6 hours. Indications: type 2 diabetes mellitus; For: type 2 diabetes   mellitus; Refills: 0   lisinopriL 20 mg tablet; Commonly known as: ZESTRIL; Dose: 20 mg; Take   one tablet via feeding tube daily. Indications: high blood pressure; For:   high blood pressure; Refills: 0; Start taking on: Oct 29, 2023   loperamide 1 mg/7.5 mL oral solution; Commonly known as: IMODIUM; Dose:   2 mg; Take 15 mL  by mouth three times daily as needed. Indications:   diarrhea; For: diarrhea; Refills: 0   melatonin 5 mg tablet; Dose: 5 mg; Take one tablet by mouth at bedtime   daily. Indications: difficulty sleeping; For: difficulty sleeping;   Refills: 0   metoprolol tartrate 25 mg tablet; Dose: 25 mg; Take one tablet via   feeding tube twice daily. Indications: high blood pressure; For: high   blood pressure; Refills: 0   petrolatum 57.7-31.9 % Oint ophthalmic ointment; Commonly known as:   STYE; Dose: 0.25 inch; Apply one-quarter inch to both eyes three times   daily. Indications: dry eye; For: dry eye; Refills: 0   rosuvastatin 40 mg tablet; Commonly known as: CRESTOR; Dose: 40 mg; Take   one tablet via feeding tube daily. Indications: hardening of the arteries   due to plaque buildup; For: hardening of the arteries due to plaque   buildup; Refills: 0   sertraline 20 mg/mL oral concentrate solution; Commonly known as:   ZOLOFT; Dose: 50 mg; Take 2.5 mL via feeding tube daily. Use the dropper   to measure the prescribed amount of sertraline and mix with 120 mL (4 oz)   of water. Administer immediately after preparing; DO NOT MIX IN ADVANCE.    Indications: anxiousness associated with depression; For: anxiousness   associated with depression; Refills: 0; Start taking on: Oct 29, 2023   thiamine mononitrate (vit B1) 100 mg tablet; Dose: 100 mg; Take one   tablet via feeding tube daily. Indications: deficiency in thiamine or   vitamin B1; For: deficiency in thiamine or vitamin B1; Refills: 0       Return Appointments and Scheduled Appointments   No appointment scheduled    Consults, Procedures, Diagnostics, Micro, Pathology   Consults: Cardiology, Endocrinology, and Rehabilitative Medicine  Surgical Procedures & Dates: None  Significant Diagnostic Studies, Micro and Procedures: none  Significant Pathology: noted in brief hospital course  Nutrition:  Malnutrition present on admission  ICD-10 code E44: Chronic illness/Moderate non-severe malnutrition      Mild loss of body fat, Mild loss of muscle mass        Loss of Subcutaneous Fat: Yes Mild Orbital, Triceps  Muscle Wasting: Yes Moderate Clavicle, Temple, Deltoid               Malnutrition Interventions: EN recs to meet 100% of needs                  Discharge Disposition, Condition   Patient Disposition: Rehab Facility ([facility]) [[id number]]  Condition at Discharge: Stable    Code Status   Full Code    Patient Instructions     Activity       Activity as Tolerated   As directed      It is important to keep increasing your activity level after you leave the hospital.  Moving around can help prevent blood clots, lung infection (pneumonia) and other problems.  Gradually increasing the number of times you are up moving around will help you return to your normal activity level more quickly.  Continue to increase the number of times you are up to the chair and walking daily to return to your normal activity level. Begin to work towards your normal activity level at discharge.          Diet       Cardiac Diet   As directed      Limiting unhealthy fats and cholesterol is the most important step you can  take in reducing your risk for cardiovascular disease.  Unhealthy fats include saturated and trans fats.  Monitor your sodium and cholesterol intake.  Restrict your sodium to 2g (grams) or 2000mg  (milligrams) daily, and your cholesterol to 200mg  daily.    If you have questions regarding your diet at home, you may contact a dietitian at [phone].             Discharge education provided to patient.    Additional Orders: Case Management, Supplies, Home Health     Home Health/DME       None              Signed:  Providence Brow, MD  10/28/2023      cc:  Primary Care Physician:  Wing Hauser   Verified    Referring physicians:  No ref. provider found   Additional provider(s):        Did we miss something? If additional records are needed, please fax a request on office letterhead to [phone]. Please include the patient's name, date of birth, fax number and type of information needed. Additional request can be made by email at [email or web address]. For general questions of information about electronic records sharing, call [phone].

## 2023-10-28 NOTE — Case Management (ED)
 Case Management Progress Note    NAME:Kevin Obrien                          MRN: [id number]              DOB:Mar 30, 1951          AGE: 73 y.o.  ADMISSION DATE: 10/17/2023             DAYS ADMITTED: LOS: 11 days      Today's Date: 10/28/2023    PLAN: Pt will dc to [facility] at 1230.    Expected Discharge Date: 10/28/2023   Is Patient Medically Stable: Yes   Are there Barriers to Discharge? no    INTERVENTION/DISPOSITION:  Discharge Planning                 SW reviewed EMR and met with NCM and Stroke team for huddle to discuss plan of care.     SW was updated by [facility] that [facility] is approved. They will pick up pt at 1230.    SW updated medical team about dc plan.    SW updated bedside RN about dc plan.    SW updated pt about dc plan. Pt was agreeable.    SW updated pt's daughter about dc plan. She was agreeable.    RN report: [id number] (please leave in IV access)  No need for transfer packet, since [facility].  No need to fax d/c orders, since  [facility].    SW will continue to remain available and assist with discharge needs as they arise.    Transportation              Does the Patient Need Case Management to Arrange Discharge Transport? (ex: facility, ambulance, wheelchair/stretcher, Medicaid, cab, other): No  Will the Patient Use Family Transport?: Yes  Transportation Name, Phone and Availability #1: pt's daughters  Support                 Info or Referral                 Positive SDOH Domains and Potential Barriers                   Medication Needs                                                                                                                                                         Financial                 Legal                 Other                 Discharge Disposition  Dorisann Garre    Dorisann Garre, [place]  Inpatient Social Work Case Manager  Neurology General and Stroke  Available via [place]  Cell: [phone]

## 2023-10-28 NOTE — Care Plan
 Problem: Discharge Planning  Goal: Participation in plan of care  Outcome: Goal Achieved  Goal: Knowledge regarding plan of care  Outcome: Goal Achieved  Goal: Prepared for discharge  Outcome: Goal Achieved     Problem: Neurological Status, Impaired/Altered  Goal: Progress toward maximizing functional outcomes  Outcome: Goal Achieved  Goal: Cognitive status restored to baseline  Outcome: Goal Achieved     Problem: High Fall Risk  Goal: High Fall Risk  Outcome: Goal Achieved     Problem: Skin Integrity  Goal: Skin integrity intact  Outcome: Goal Achieved  Goal: Healing of skin (Wound & Incision)  Outcome: Goal Achieved  Goal: Healing of skin (Pressure Injury)  Outcome: Goal Achieved     Problem: Mobility/Activity Intolerance  Goal: Maximize functional ADL's and mobility outcomes  Outcome: Goal Achieved     Problem: VTE, Risk of  Goal: Absence of venous thrombosis  Outcome: Goal Achieved  Goal: Knowledge of warfarin regimen  Outcome: Goal Achieved     Problem: Injury-Risk of, Non-Violent Physical Restraints  Goal: Absence of Injury while physically restrained (Non-Violent)  Outcome: Goal Achieved     Problem: Nutrition Deficit  Goal: Adequate nutritional intake  Outcome: Goal Achieved  Goal: Body weight within specified parameters  Outcome: Goal Achieved     Problem: Infection, Risk of  Goal: Absence of infection  Outcome: Goal Achieved  Goal: Knowledge of Infection Control Procedures  Outcome: Goal Achieved

## 2023-10-28 NOTE — Progress Notes
 CLINICAL NUTRITION                                                        Clinical Nutrition Follow-Up Assessment     Name: Kevin Obrien   MRN: [id number]     DOB: 03/01/1951      Age: 73 y.o.  Admission Date: 10/17/2023     LOS: 11 days     Date of Service: 10/28/2023        Recommendation:  REC continuous enteral nutrition of Jevity 1.5 with goal rate of 61mL/hr (total 1080mL/day) + 1pks of prosource protein.   At goal provides 1700kcal, 89g protein, and free H2O.   Additional free water flush per primary team, recommend at least 30mL q4h for tube patency. Maintenance fluid flushes if desired are q4h.   If transitioning to bolus feeds, recommend Jevity 1.5 bolus feeds, 5 cartons/day + 1 prosource pkt.     Recommend 1/2 carton per bolus for first 1-2 feeds, then proceed to goal regimen.    This will provide 1857kcal, 96g protein, free H2O.    Recommend 30mL free H2O flush before and after each bolus + 100mL q4h to meet fluid needs.     To help with loose stools, before interrupting EN support:    Avoid sorbitol containing medications such as liquid tylenol as these can make diarrhea worse; recommend switching to crushable tablet form.    Consider probiotic.    If loose stools persist, recommend adding 1-2 packets of nutrisource fiber per day.     The nutrition-related order modifications are made in communication with the primary service, who remains responsible for the orders and overall care of the patient.    Comments:  Clinical nutrition following for EN management. S/p PEG tube placement 5/15. EN restarted 5/16 pm via PEG tube. 3-day EN avg meeting ~55-70% minimum kcal, protein needs. Pt with abdominal pain, 2-4 BMs/day x 3 days. Not on bowel regimen. C. diff negative. KUB showed mild gaseous distention of large and small bowel without evidence of obstruction. Noted augmentin started 5/16. Labs/meds reviewed. Pt at acute nutrition risk, RD will continue to follow. Nutrition Assessment of Patient:  Admit Weight: 66.7 kg (no wt source); Weight Change Since Admit: 0   BMI (Calculated): 24.46    Pertinent Allergies/Intolerances: NKFA           Intake Comment: 3-day E avg  Intake (calories) Daily Average : 896 kilocalories (54% minimum kcal needs)  Intake (protein) Daily Average : 55 grams (68% minimum protein needs)  Weight Used for Calculation: 66.7 kg  Estimated Calorie Needs: 1668-2001kcal (25-30kcal/kg)  Estimated Protein Needs: 80-93g (1.2-1.4g/kg)    Malnutrition Assessment:  Malnutrition present on admission  ICD-10 code E44: Chronic illness/Moderate non-severe malnutrition      Mild loss of body fat, Mild loss of muscle mass            Malnutrition Interventions: EN recs to meet 100% of needs    Nutrition Focused Physical Exam:  Loss of Subcutaneous Fat: Yes; Severity: Mild; Location: Orbital, Triceps  Muscle Wasting: Yes; Severity: Moderate; Location: Clavicle, Temple, Deltoid  Physical Assessment:     Ascites: No  Pressure Injury: none documented     Comment: LBM 5/19 +diarrhea    Nutrition Diagnosis:  Inadequate oral intake  Etiology: swallowing difficulty  Signs & Symptoms: NPO status, need for enteral nutrition                      Intervention / Plan:  EN recs to meet 100% of needs  Monitor EN rate/tolerance, weights, labs, meds, GI fxn           Theodis Fiscal, MS, RD, LD  Clinical Dietitian

## 2023-10-28 NOTE — Progress Notes
 Endocrinology Note   Name:  Kevin Obrien MRN:  [id number] Admission Date:  10/17/2023    Principal Problem:    Ischemic stroke (CMS-HCC)  Active Problems:    Diabetes mellitus (CMS-HCC)    Neuropathy    Peripheral artery disease    Essential tremor    Poorly controlled diabetes mellitus (CMS-HCC)    Gaze preference    Moderate malnutrition      Reason for Consult:     10/19/23 1048  CONSULT ENDOCRINOLOGY PHYSICIAN  ONCE        Priority: Routine   Ordering Provider: Vannie Gentile, APRN-NP      Provider: (Not yet assigned)   Question Answer Comment   Reason for consult high A1C, no management    Is this consult for diabetes management? Yes    Consult Type: Co-Management w/Signed Orders    Ordering Provider's or Responsible Teams's Phone/Pager number? Number not on file              Assessment / Plan     Admission date and time: 10/17/2023  8:24 AM     Type 2 Diabetes Mellitus uncontrolled  Insulin dose changed  Non adherance to meds  Acute stroke   Dyslipidemia   Tube feed hyperglycemia   A1c 11.7, 10/17/2023   PTA regimen: non compliant, was supposed to be on meal time insulin. Was taking very sporadically   Was not taking any PO meds either   Hypoglycemic episodes on this regimen: none   Follows up with ? PCP for diabetes management  Diabetic-complications assessment:  CAD, stroke, neuropathy     Aspiration pneumonia    Current diet: Jevity 1.5 TF at goal 45 ml/hr   Glucocorticoids: none     Glucose values over the past 24 hours:    Estimated Creatinine Clearance: 80.4 mL/min (based on SCr of 0.76 mg/dL).    Recent Labs     10/26/23  1524 10/26/23  1755 10/26/23  2106 10/27/23  0528 10/27/23  1131 10/27/23  1857 10/28/23  0027 10/28/23  0756   GLUPOC 266* 308* 242* 88 283* 155* 192* 263*       Diabetes Regimen Changes today based on glucose values:    Continue insulin glargine 18u every day   Increase regular insulin to 20 U q6 hrs.   Continue correction factor q6h    Goal BG 140-180.     Diabetes Plans Post Discharge  Diabetes regimen: TBD  He will most likely need insulin on discharge.   We will request for outpatient follow-up. Family would like to come to [facility]. He used to see [facility] and last visit was in 2020    Subjective:      Feeds running at goal at this time.     Vital Signs: Last Filed In 24 Hours Vital Signs: 24 Hour Range   BP: 147/60 (05/19 0824)  Temp: 37.1 ?C (98.8 ?F) (05/19 4540)  Pulse: 77 (05/19 0824)  Respirations: 18 PER MINUTE (05/19 0824)  SpO2: 96 % (05/19 0824)  O2 Device: None (Room air) (05/19 0824) BP: (121-162)/(59-75)   Temp:  [36.9 ?C (98.4 ?F)-37.2 ?C (99 ?F)]   Pulse:  [74-81]   Respirations:  [18 PER MINUTE]   SpO2:  [95 %-97 %]   O2 Device: None (Room air)   Intensity Pain Scale (Self Report): 4 (10/27/23 1233)      Peg tube feeds running  Patient is awake  Breathing on room air.  No CP      Current Facility-Administered Medications:     acetaminophen (OFIRMEV) 1,000 mg injection 100 mL, 1,000 mg, Intravenous, Q8H PRN, Nelly Banco, MD, Last Rate: 400 mL/hr at 10/25/23 0338, 1,000 mg at 10/25/23 1610    acetaminophen (TYLENOL) tablet 650 mg, 650 mg, PEG Tube, Q4H PRN, 650 mg at 10/27/23 1233 **OR** acetaminophen (TYLENOL) rectal suppository 650 mg, 650 mg, Rectal, Q4H PRN, Gerkovich, Kendal M, APRN-NP    amLODIPine (NORVASC) tablet 10 mg, 10 mg, Per NG tube, QDAY, Gerkovich, Kendal M, APRN-NP, 10 mg at 10/28/23 9604    amoxicillin-potassium clavulanate (AUGMENTIN) tablet 875 mg, 875 mg, Feeding Tube, BID w/meals, Lingam, Sivani, MBBS, 875 mg at 10/28/23 0825    artificial tears (PF) single dose ophthalmic solution 1 drop, 1 drop, Left Eye, TID, Al-Sabbagh, Mohammed Qussay Ali, MD, 1 drop at 10/28/23 5409    aspirin chewable tablet 81 mg, 81 mg, Per NG tube, QDAY, Gerkovich, Kendal M, APRN-NP, 81 mg at 10/28/23 8119    clopiDOGreL (PLAVIX) tablet 75 mg, 75 mg, Per NG tube, QDAY, Gerkovich, Kendal M, APRN-NP, 75 mg at 10/28/23 0824    dextrose 50% (D50) syringe 25-50 mL, 12.5-25 g, Intravenous, PRN, Peters, Lindsey M, APRN-NP    diclofenac sodium (VOLTAREN) 1 % topical gel 2 g, 2 g, Topical, QID PRN, Nelly Banco, MD, 2 g at 10/24/23 1478    Diet Enteral Feeding Standard Infusion, , SEE ADMIN INSTRUCTIONS, Continuous, Seabron Cypress, Kendal M, APRN-NP, Last Rate: 45 mL/hr at 10/28/23 0841, Rate Verify at 10/28/23 0841    enoxaparin (LOVENOX) syringe 40 mg, 40 mg, Subcutaneous, QDAY(21), Shaker, Faris, MBChB, 40 mg at 10/27/23 2153    flumazeniL (ROMAZICON) injection 0.2 mg, 0.2 mg, Intravenous, PRN, Alanda Allegra P, APRN-NP    insulin aspart (U-100) (NOVOLOG FLEXPEN U-100 INSULIN) injection PEN 0-12 Units, 0-12 Units, Subcutaneous, Q6H, Zafar, Aiman, MD, 2 Units at 10/28/23 0031    insulin glargine (LANTUS SOLOSTAR U-100 INSULIN) injection PEN 18 Units, 18 Units, Subcutaneous, QDAY, Zafar, Aiman, MD, 18 Units at 10/27/23 1203    insulin regular human (NOVOLIN R FLEXPEN) injection PEN 18 Units, 18 Units, Subcutaneous, Q6H, Zafar, Aiman, MD, 18 Units at 10/28/23 0029    lidocaine hcl (XYLOCAINE) 2 % jelly (URO-JET), , Topical, PRN, Lingam, Sivani, MBBS, Given at 10/23/23 1545    lisinopriL (ZESTRIL) tablet 20 mg, 20 mg, Feeding Tube, QDAY, Camerucci, Emanuele, MD, 20 mg at 10/28/23 0824    melatonin tablet 5 mg, 5 mg, Oral, QHS, Al-Sabbagh, Mohammed Qussay Ali, MD, 5 mg at 10/27/23 2154    metoprolol tartrate tablet 25 mg, 25 mg, Feeding Tube, BID, Gerkovich, Kendal M, APRN-NP, 25 mg at 10/28/23 0824    nalOXone (NARCAN) injection 0.08 mg, 0.08 mg, Intravenous, PRN, Andris Banning, APRN-NP    pancrelipase 20,880 Units/sodium bicarbonate 650 mg ([place] CLOG DESTROYER), , Feeding Tube, PRN (On Call from Rx), Seabron Cypress, Kendal M, APRN-NP, Given at 10/25/23 1344    phenoL (CHLORASEPTIC) spray 2 spray, 2 spray, Mouth/Throat, PRN, Gerkovich, Kendal M, APRN-NP, 2 spray at 10/26/23 2306    [Held by Provider] Protein Supplement Packets, , SEE ADMIN INSTRUCTIONS, QDAY, Vannie Gentile, APRN-NP, Given at 10/23/23 0858    [Held by Provider] QUEtiapine (SEROquel) tablet 25 mg, 25 mg, Oral, QHS, Maali, Jeralene Mom, MD    rosuvastatin (CRESTOR) tablet 40 mg, 40 mg, Feeding Tube, QDAY, Gerkovich, Kendal M, APRN-NP, 40 mg at 10/28/23 0824    sertraline (ZOLOFT) oral conc 50  mg, 50 mg, Feeding Tube, QDAY, Lingam, Sivani, MBBS, 50 mg at 10/28/23 0824    thiamine mononitrate (vit B1) tablet 100 mg, 100 mg, Per NG tube, QDAY, Vannie Gentile, APRN-NP, 100 mg at 10/28/23 0825    Basic Metabolic Profile    Lab Results   Component Value Date/Time    NA 139 10/28/2023 05:08 AM    K 3.6 10/28/2023 05:08 AM    CA 9.0 10/28/2023 05:08 AM    CL 105 10/28/2023 05:08 AM    CO2 23 10/28/2023 05:08 AM    GAP 11 10/28/2023 05:08 AM    EGFR1 >60 11/13/2020 05:51 PM    Lab Results   Component Value Date/Time    BUN 16 10/28/2023 05:08 AM    CR 0.76 10/28/2023 05:08 AM    GLU 227 (H) 10/28/2023 05:08 AM          Electronically signed by Jody Mura, MD 10/28/2023

## 2023-10-29 ENCOUNTER — Inpatient Hospital Stay: Admit: 2023-10-29 | Discharge: 2023-10-29 | Payer: MEDICARE

## 2023-10-29 ENCOUNTER — Encounter: Admit: 2023-10-29 | Discharge: 2023-10-29 | Payer: MEDICARE

## 2023-10-29 LAB — POC GLUCOSE
~~LOC~~ BKR POC GLUCOSE: 253 mg/dL — ABNORMAL HIGH (ref 70–100)
~~LOC~~ BKR POC GLUCOSE: 101 mg/dL — ABNORMAL HIGH (ref 70–100)
~~LOC~~ BKR POC GLUCOSE: 248 mg/dL — ABNORMAL HIGH (ref 70–100)
~~LOC~~ BKR POC GLUCOSE: 296 mg/dL — ABNORMAL HIGH (ref 70–100)
~~LOC~~ BKR POC GLUCOSE: 60 mg/dL — ABNORMAL LOW (ref 70–100)

## 2023-10-29 LAB — BASIC METABOLIC PANEL CELLULAR THERAPEUTICS
~~LOC~~ BKR ANION GAP: 9 mL/min — ABNORMAL LOW (ref 3–12)
~~LOC~~ BKR BLD UREA NITROGEN: 16 mg/dL — ABNORMAL LOW (ref 7–25)
~~LOC~~ BKR CALCIUM: 9.3 mg/dL (ref 8.5–10.6)
~~LOC~~ BKR CHLORIDE: 105 mmol/L — ABNORMAL HIGH (ref 98–110)
~~LOC~~ BKR CO2: 27 mmol/L (ref 21–30)
~~LOC~~ BKR CREATININE: 0.7 mg/dL (ref 0.40–1.24)
~~LOC~~ BKR GLOMERULAR FILTRATION RATE (GFR): >60 mL/min (ref >60–336)

## 2023-10-29 LAB — CBC CELLULAR THERAPEUTICS: ~~LOC~~ BKR MCH: 29 pg — ABNORMAL HIGH (ref 26.0–34.0)

## 2023-10-29 MED ORDER — AMLODIPINE 5 MG PO TAB
5 mg | Freq: Every day | NASOGASTRIC | 0 refills | Status: DC
Start: 2023-10-29 — End: 2023-11-22
  Administered 2023-10-30 – 2023-11-21 (×23): 5 mg via NASOGASTRIC

## 2023-10-29 MED ORDER — INSULIN ASPART 100 UNIT/ML SC FLEXPEN
14 [IU] | Freq: Four times a day (QID) | SUBCUTANEOUS | 0 refills | Status: DC
Start: 2023-10-29 — End: 2023-11-09
  Administered 2023-10-29 – 2023-11-06 (×2): 14 [IU] via SUBCUTANEOUS

## 2023-10-29 MED ORDER — TUBE FEED BOLUS (ENAR)
Freq: Four times a day (QID) | 0 refills | Status: DC
Start: 2023-10-29 — End: 2023-11-15

## 2023-10-29 MED ORDER — INSULIN ASPART 100 UNIT/ML SC FLEXPEN
14 [IU] | Freq: Four times a day (QID) | SUBCUTANEOUS | 0 refills | Status: DC
Start: 2023-10-29 — End: 2023-10-29

## 2023-10-29 MED ORDER — NYSTATIN 100,000 UNIT/ML PO SUSP
500000 [IU] | Freq: Four times a day (QID) | ORAL | 0 refills | Status: DC
Start: 2023-10-29 — End: 2023-11-18
  Administered 2023-10-29 – 2023-11-18 (×62): 500000 [IU] via ORAL

## 2023-10-29 MED ORDER — WATER BOLUS (ENAR)
100 mL | Freq: Four times a day (QID) | GASTROSTOMY | 0 refills | Status: DC
Start: 2023-10-29 — End: 2023-11-15

## 2023-10-29 MED ORDER — IOHEXOL 350 MG IODINE/ML IV SOLN
60 mL | Freq: Once | INTRAVENOUS | 0 refills | Status: CP
Start: 2023-10-29 — End: ?
  Administered 2023-10-29: 20:00:00 60 mL via INTRAVENOUS

## 2023-10-29 MED ORDER — INSULIN ASPART 100 UNIT/ML SC FLEXPEN
0-12 [IU] | Freq: Four times a day (QID) | SUBCUTANEOUS | 0 refills | Status: DC
Start: 2023-10-29 — End: 2023-11-22
  Administered 2023-11-10: 22:00:00 8 [IU] via SUBCUTANEOUS

## 2023-10-29 MED ORDER — POTASSIUM CHLORIDE 20 MEQ PO TBTQ
40 meq | Freq: Once | 0 refills | Status: CP
Start: 2023-10-29 — End: ?
  Administered 2023-10-29: 19:00:00 40 meq

## 2023-10-29 MED ORDER — SODIUM CHLORIDE 0.9 % IJ SOLN
50 mL | Freq: Once | INTRAVENOUS | 0 refills | Status: CP
Start: 2023-10-29 — End: ?
  Administered 2023-10-29: 20:00:00 50 mL via INTRAVENOUS

## 2023-10-29 NOTE — Rehab Team Conference
 Team Conference Note     Date of Admission:  10/28/2023  Date of Team Conference:  10/29/2023   Kevin Obrien is a 73 y.o. male.     DOB: [birth year]                  MRN#:  [id number]    Team Conference  Attendees: Alyce Crest, Attending Physician; Corean Brewster MD, Resident Physician; Clarita Hummer SW, Social Worker; Silvano Perry RN, Teaching laboratory technician; Macye Witmer, OTR, Occupational Therapy; KasiDee Cox, DPT, Physical Therapy; Kenzie Mcanulty, SLP, Speech Therapy; Chiquita Oneda CARSON, Recreational Therapist; Stevphen Fret PharmD, Pharmacy; [name] RD LD, Clinical Nutrition; Harland Spangle RN, Nursing    Medical Update: Kevin Obrien is a 73 y.o. male admitted to rehabilitation for stroke with resultant impairments including weakness, visual impairment, bilateral horizontal gaze palsy, diplopia, L facial paralysis (bells palsy), R facial weakness (brainstem stroke), dysphagia, dysarthria .    Patient will continue to receive Physical therapy, Occupational therapy, and Speech therapy each 60 minutes a day, 5 days a week for the duration of the rehabilitation stay within an interdisciplinary rehabilitation program with rehab nursing and PM&R physician oversight.    Medical updates this week:   -new admission 5/19  -endocrinology assistance for DM management planning  -monitoring for ongoing infection- completing course of antibiotics today for aspiration pneumonia.  -adjusted tube feeds to bolus feeds last evening  -optimization of antihypertensive regimen  -treatment initiation of oral thrush  -evaluate urinary retention    Medical barriers to discharge: Tube feeds plan and teaching. Diabetes management plan. Neurogenic bladder management plan- possible foley. There are no medical barriers to discharge regarding PICC line, anticoagulation, ongoing labs, appointment timing, new oxygen, new dialysis, wound vacuum.     Follow-up Appointments (list specialities, not appointment details): Neurology, PM&R, Cardiology        Team Goal: Patient will perform mobility and self-cares with minimal assistance, wheelchair level     Discharge Planning     Discharge Date:  11/19/2023    Patient lives with his significant other and 66 y.o. daughter in a single family home that accomodates 1 level living. 2 STE.  Patient is independent with all ADLs and IADLs at baseline.  DME: SPC  Pt is self employed. Owns a cleaning business.    DME: Wheelchair (custom)  Plan: Home with significant other, consistent supervision  Rehabilitation Plan     Progress  Therapy evaluations in progress  Able to bridge and pull up pants    Barriers/Concerns  Bladder scans and straight caths. Reinforce education on keeping the patch on L eye at night. Pateitn keep removing the patch from the left eye  No pain reported, No open wound but diffuse bruising noted  New admit  Would not open eyes  Not verbally communicating  Whacking hand away  Poor safety and sequencing  Variable transfers  Poor vision - diplopia, Bell's palsy    Recommending two people for transfers initially.    Plan     Dietitian: Cough response with first drink of water. Severe malnutrition prior to admission. Bolus tube feeds. Baseline: loose stools.   Neuropsychology: Helps to brush his teeth and wash his face. Fully oriented. Worked as a Merchandiser, retail for a IT consultant. Was driving prior to admission. Family is a huge motivator. Speaks both [place] and [place].  Therapeutic Recreation: Evaluating later today.     Weekly Team Goals  Oral thrush  - ordering swish/spit  - good oral cares  Tube feeds  - video swallow study  Straight cathing  Bowel incontinence  2 STE - bump up vs small ramp    Diabetic management?  - insulin would be new  - Diabetic education    Up/down schedule    TBD on whether Spanish interpreter may help improve participation    Potential sleep bundle + lamp/sound machine  - softer eye patch to wear at night    Wear glasses   - only vertical eye movements, not lateral    Caregiver Training Plan: Daughter(s), girlfriend(?)  - tube feeds  - intermittent catheterization  - diabetic management    Quality Indicators   Swallowing/Nutritional Status: Tube/parenteral feeding  Bladder Continence:  (straight cath)  Bowel continence: 3-Always incontinent    If you have questions about the above note, please talk to the Physical Medicine & Rehabilitation team or your Social Worker/Nurse Case Manager.    When applicable, [facility] will update your insurance representative weekly to ensure continuation of coverage throughout your stay. Any changes to your insurance authorization throughout your stay will be conveyed to you. The Health System Billing Department can be contacted Monday-Friday (7:30am-5:30pm) by phone at [phone] or [phone].

## 2023-10-29 NOTE — Progress Notes
 SPEECH-LANGUAGE PATHOLOGY  CLINICAL SWALLOW ASSESSMENT     Name: Kevin Obrien   MRN: [id number]     DOB: [birth year]      Age: 73 y.o.  Admission Date: 10/28/2023     LOS: 1 day     Date of Service: 10/29/2023        Evaluation Summary     Clinical swallow evaluation completed; see details below. Informal assessment of cognition revealed pt fully oriented excluding reasoning for hospitalization + name of hospital. Pt required cues for initiation/termination of functional sequencing tasks. Able to complete oral cares with set-up assist and supervision + cues for swish/spit.  Clinical Impression: Moderate to severe dysphagia  Sources of Dysphagia: Weakness from recent CVA         Swallow Recommendations  PO: Ice chips only - when brightly alert + w/ excellent oral care to minimize potential of aspiration oral bacteria  NPO: Continue long term non-oral nutrition  Medications:  via PEG (OK to have swish/spit nystatin for suspected oral thrush)  Ice Chip Trials: Unlimited, 1 at a time under direct supervision  Supervision: 1:1  Positioning: Upright as tolerated  Oral Hygiene: 4 times per day, Complete oral care to minimize the risk of aspirating oral bacteria      PO Presentation  Presentations: Therapist Fed, Patient Fed Self  Thin Liquid: Cup, Straw (controlled volumes)  Mildly Thick Liquid: Cup, Straw (controlled volumes)  Other Consistencies: Ice chips, Puree    Clinical Interpretation of Oral Stage  Withdraw Bolus: Impaired labial closure, Lingual incoordination  Form Bolus: Slowed  Masticate Bolus: Did not trial masticated solids due to safety concerns (Comment)  Transfer Bolus: Suspect early spillover  Anterior Bolus Spillage: On left  Oral Residue: Minimal, Throughout    Clinical Interpretation of Pharyngeal Stage  Swallow Initiation:  (Suspected to be delayed)  Laryngeal Elevation: Suspected to be reduced  Signs / Symptoms Of Aspiration: Thin liquid, Mildly thick liquid, Puree  Thin Liquid: Cough  Mildly Thick Liquid: Throat clear  Puree: Throat clear  Pharyngeal Stage Summary: Immediate reflexive cough x1 following initial sip thin liquids via cup; no additional coughing noted however pt has hx of silent aspiration with recents completion of FEES.    Oral Mech Exam  Oral Mech WFL: No  Lips: Impaired ROM - L, Impaired Strength - L, Impaired Coordination  Tongue: Impaired ROM - L, Impaired Strength - L, Impaired Coordination  Buccal: Impaired ROM - L, Impaired Strength - L  Vocal Quality: Hoarse, Reduced Loudness (dysarthria present)  Volitional Cough: Weak  Throat Clear: Weak  Dentition:  (edentulous to mandible)      Subjective  Reason for Admission and Past Medical Hx: [name] is a 73 y.o. male and  has a past medical history of Accidental fall, Angina pectoris, Arthritis, BPH (benign prostatic hyperplasia), Cataract, Constipation, Coronary artery disease, DM (diabetes mellitus) (CMS-HCC), ED (erectile dysfunction), Heart attack (CMS-HCC), Hyperlipidemia, Hypertension, Joint pain, Neuropathy, and Shingles. The patient presented to [facility] on 10/17/2023 with left gaze deviation, facial weakness and dysarthria.  CT head was negative for acute changes.  CTA head and neck without LVO.  Patient was not a TNK or EVT candidate. 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 Neurology recommend DAPT with aspirin and Plavix.  He is currently on permissive hypertension. The patient's hospital course is functionally complicated by impaired mobility and ADLs.  Special Considerations: Feeding tube present  Persons Present: Daughter, Speech Therapist    Dysphagia hx:  Pt familiar to acute ST dept where FEES completed 10/18/23 revealing moderate-severe oropharyngeal dysphagia resulting in silent aspiration of all consistencies trialed. Recommendations for NPO with ice chip protocol. Pt started on antibiotic tx with concern for potential aspiration pneumonia.    Home Living Situation  Type of Home: House      Imaging  MRI HEAD WO/W CONTRAST  Result Date: 10/28/2023  MRI: 1.  Redemonstration of evolving acute-subacute pontomedullary infarcts, with increased conspicuity of ischemia in the posterior brain stem since the comparison examination. 2.  Punctate focus of right lateral precentral gyral cortical enhancement without corresponding signal abnormality on additional sequences, potentially reflecting incidental vascular enhancement. Tiny subacute enhancing infarct could appear similar. 3.  Stable mild generalized cerebral and cerebellar volume loss, moderate chronic microvascular ischemic cerebral white matter changes, and multiple superimposed scattered superimposed chronic infarcts. MRA: 1.  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 2.  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 No vessel wall edema or enhancement to suggest active arterial wall inflammation.  Finalized by Rockey Gin, DO on 10/28/2023 8:18 AM. Dictated by Rockey Gin, DO on 10/28/2023 7:51 AM    CHEST XRAY 10/23/23:  Similar positioning of the feeding tube.     The lung volume is normal. Heterogeneous left basilar opacities are   slightly more conspicuous and could represent aspiration given high   clinical suspicion. No pleural effusion or pneumothorax.     The cardiomediastinal silhouette is stable.     Nutrition  Nutrition Prior To Hospitalization: Oral, Regular, Thin Liquids  Current Form Of Nutrition: NPO, PEG    Education  Persons Educated: Pt/Family  Barriers To Learning: Cognitive Deficits, Language Barrier  Interventions: Family Educated, Staff Educated  Teaching Methods: Verbal  Topics: Dysphagia  Patient Response: Verbalized Understanding, More Instruction Required  Goal Formulation: With Pt/Family    Assessment/Prognosis  Plan: Continue treatment daily  Prognosis: Fair, Good  NOMS Dysphagia Rating: 2-Moderately-Severe Dysphagia -Not able to swallow safely by mouth for nutrition/hydration but may take some consistency w/ consistent max cues in therapy only. Alternative method of feeding required.        Therapist: Rion Schnitzer, MA CCC-SLP  Date:10/29/2023

## 2023-10-29 NOTE — Progress Notes
 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  male admitted to [facility] on 10/28/2023 with the following issues: stroke      Rehabilitation Plan  Tentative discharge date: 11/19/2023  Rehabilitation: Patient will continue with comprehensive therapies including physical therapy, occupational therapy, speech & language pathology, specialized rehab nursing, neuropsychology and physiatry oversight.    Goals:  Patient Will Perform Basic Cares With: Partial/moderate assistance, Caregiver assist of one  Patient Will Perform Household Mobility With: Partial/moderate assistance, At wheelchair level  Recommended therapy after discharge: Consistent supervision, Home setting with home health  PT recommended equipment: Too early to determine  OT recommended equipment: Tub transfer bench    Daily Functional Update:  Stand to Sit Assist Level: Dependent (10/29/2023  1:00 PM)  Equipment: Hand hold assist; Grab bar; Roller walker (10/29/2023  1:00 PM)     Stand Pivot Assist Level: Dependent (10/29/2023  1:00 PM)  Equipment: Hand hold assist; Grab bar (10/29/2023  1:00 PM)     Slideboard No data recorded  No data recorded   Gait No data recorded  No data recorded  No data recorded   Wheelchair No data recorded  No data recorded   Toileting       Assist Level: Dependent (10/29/2023  1:00 PM)  Equipment: Grab bar (10/29/2023  1:00 PM)     Toilet Transfer No data recorded   Upper Body Dressing Assist Level: Substantial/maximal assistance (10/29/2023  1:00 PM)     Lower Body Dressing Assist Level: Substantial/maximal assistance (10/29/2023  1:00 PM)       Diet: PO: Ice chips only (10/29/2023  9:15 AM)      No data found.        Current Medical Problems/Risks of Medical Complications/Management    Acute Right Pontomedullary Ischemic Stroke  Proximal Right Intracranial Vertebral Artery Occlusion  Diffuse Intracranial Atherosclerosis  Bilateral Horizontal Gaze Palsy  History of Forced Left Gaze Deviation  Left Facial Palsy  Impairments: left hemiparesis, expressive aphasia, oropharyngeal dysphagia, right facial weakness, left facial droop with dysarthria, diplopia, concern for attention/cognitive impairments, LUE ataxia, neurogenic bowel, neurogenic bladder  Impaired mobility and ADL, impaired swallow and cognition/communication  Presented with acute facial droop, left gaze and dysarthria  leading to stroke activation found to have BP 210/85, CTA revealing significant calcified atherosclerosis and diffuse intracranial vascular stenoses.   He did not receive TNK due to LKW outside window.  Imaging findings   Repeat MRI Brain 05/18  1.  Redemonstration of evolving acute-subacute pontomedullary infarcts, with increased conspicuity of ischemia in the posterior brain stem since the comparison examination.   2.  Punctate focus of right lateral precentral gyral cortical enhancement without corresponding signal abnormality on additional sequences, potentially reflecting incidental vascular enhancement. Tiny subacute enhancing infarct could appear similar.   3.  Stable mild generalized cerebral and cerebellar volume loss, moderate chronic microvascular ischemic cerebral white matter changes, and multiple superimposed scattered superimposed chronic infarcts.   Brain MRA 05/18  1.  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   2.  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 No   vessel wall edema or enhancement to suggest active arterial wall inflammation.   ECHO findings- EF of 65%, bubble study shows a Right to Left Shunting consistent with a PFO, no thrombus/valvular vegetation, LA index WNL   Suspected etiology: Large artery atherosclerosis                > consult PT/OT/SLP and neuropsychology to evaluate and treat               > discuss with patient/family use of SSRI for motor recovery and post-stroke depression- pt is already on sertraline 50mg                > consider discussion with patient/family regarding the use of acetylcholinesterase inhibitor for aphasia recovery and post-stroke cognitive deficit I.e. donepezil  or stimulant including modafinil                > Consider orthotics consult following therapy evaluation               > Sleep optimization: melatonin 5mg  qhs, previously on seroquel nightly               > follow up: Neurology, PM&R, Cardiology,     Increased Somnolence   > Concern for increased somnolence 5/20 leading to Neurology Consult- rec CTA H&N, liberalize BP I.e. decreasing amlodipine to 5mg ; if acute focal weakness, numbness, coma, etc. Stroke activate/send to ED as he is high risk to have progression of his stroke/basilar involvement. Neurology will continue to follow.    > CTA Head and Neck   > Infectious workup including UA, CXR     Secondary Stroke Risk Optimization  > Antiplatelet/Anticoagulation: DAPT with aspirin 81mg  daily and clopidogrel 75mg  daily for 90 days followed by aspirin 81mg  indefinitely  > Blood Pressure Goal: <160 during acute hospitalization, long term goal of < 130/80               - Current antihypertensive medications: amlodipine 10mg  daily, lisinopril 20mg  daily  > Lipids: LDL is 161, with goal of <70: rosuvastatin 40mg  daily (per Cardiology recs)  > Blood Glucose: Hgb A1c is 11.7%, with goal of <7  > Tobacco abstinence goal     At risk for Spasticity               > consider trial of baclofen if indicated, discuss potential benefits and side effects               > daily ROM with therapy  Dysphagia s/p PEG tube placement  Moderate Malnutrition  - PEG tube placement 5/15               > Dietician consult, appreciate tube feed recommendations- transition from continuous to bolus feeds 5/19               Recommend Jevity 1.5 bolus feeds, 5 cartons/day + 1 prosource pkt.     Recommend 1/2 carton per bolus for first 1-2 feeds, then proceed to goal regimen.    This will provide 1857kcal, 96g protein, free H2O.    Recommend 30mL free H2O flush before and after each bolus + 100mL q4h to meet fluid needs.   To help with loose stools, before interrupting EN support:    Avoid sorbitol containing medications such as liquid tylenol as these can make diarrhea worse; recommend switching to crushable tablet form.    Consider probiotic.    If loose stools persist, recommend adding 1-2 packets of nutrisource fiber per day.      Aspiration Pneumonia  S/p AHRF  - increased O2 req overnight 5/13 with elevated procal, tachycardia, leukocytosis  - CXR 5/13 notable for mild basal opacities, favored to represent atelectasis  - s/p vanc x1 and zosyn x3 days  - sputum cx NGTD               > Augmentin to complete total 7 day course of antibiotics through 5/20 AM     Left Bell's Palsy               > Alternate eye patches; eyepatch for the L eye overnight, artificial tears TID daily in the setting of left facial palsy     Pseudobulbar Affect  > Per Neurology: Consider starting Dextromethorphan-Quinidine in the outpatient setting (unfortunately unavailble on the formulary inpatient) for Pseudobulbar affect, will start Fluoxetine 20mg  in the meantime --> instead was started on Sertraline 50mg , will continue while in rehab  > Neurology clinic follow up on dc     Oral Thrush               > oral care TID   > Nystatin swish and spit started 5/20, ok'd by SLP     Bowel Management  At risk for Neurogenic Bowel  Loose Stools  -Last Bowel Movement Date: 10/29/23 (10/29/2023  6:13 PM)  - Goal for a daily BM or every 24-48 hours.   - c diff negative 5/18  > adjust bowel regimen PRN   > loperamide PRN     Bladder Management  Neurogenic Bladder with Retention       -Post-Void (PVR) Bladder Scan (mL): 451 milliliters (10/29/2023  6:13 PM)  -Straight Cath (mL): 400 (10/29/2023  2:36 AM)  > Monitor urinary retention per unit protocol. If unable to void or PVRs >300cc perform clean ISC (intermittent straight catheterization) for volumes > . Stop checking BVIs if 3 consecutive volumes <150cc.   > Consider foley placement     Hypertension  LVOT Obstruction  PFO  CAD s/p PCI               > metoprolol 25mg  BID               > amlodipine 10mg  daily decreased to 5mg  5/20, lisinopril 20mg  daily               > 30 day MCOT                >  Follow up with outpatient Cardiology      Diabetes Mellitus               > Endocrinology consult for ongoing discharge regimen planning     Depression/Mood screen :          > neuropsychology to evaluate and treat for adjustment to new deficits          > Encouragement and support    Nutrition:   Body mass index is 21.9 kg/m?SABRA  Malnutrition Details:  Malnutrition present on admission  ICD-10 code E44: Chronic illness/Moderate non-severe malnutrition      Mild loss of muscle mass, Mild loss of body fat, Energy intake: Less than 75% of estimated energy requirement for 1 month or more        Loss of Subcutaneous Fat: Yes Mild Orbital, Triceps  Muscle Wasting: Yes Moderate Clavicle, Temple, Deltoid               Malnutrition Interventions: EN recs to meet 100% estimated kcal and protein needs    Wounds:        DVT ppx: enoxaparin (LOVENOX) syringe 40 mg  QDAY(21); patient will not require at the time of discharge    Code Status: Full Code    Current Medications:  Scheduled Meds:[START ON 10/30/2023] amLODIPine (NORVASC) tablet 5 mg, 5 mg, Per NG tube, QDAY  artificial tears (PF) single dose ophthalmic solution 1 drop, 1 drop, Left Eye, TID  aspirin chewable tablet 81 mg, 81 mg, Per NG tube, QDAY  clopiDOGreL (PLAVIX) tablet 75 mg, 75 mg, Per NG tube, QDAY  Diet Enteral Feeding Bolus, , SEE ADMIN INSTRUCTIONS, QID (3,87,83,79)   And  Water Bolus, 100 mL, PEG Tube, QID (3,87,83,79)  enoxaparin (LOVENOX) syringe 40 mg, 40 mg, Subcutaneous, QDAY(21)  insulin aspart (U-100) (NOVOLOG FLEXPEN U-100 INSULIN) injection PEN 0-12 Units, 0-12 Units, Subcutaneous, QID  insulin aspart (U-100) (NOVOLOG FLEXPEN U-100 INSULIN) injection PEN 14 Units, 14 Units, Subcutaneous, QID  insulin glargine (LANTUS SOLOSTAR U-100 INSULIN) injection PEN 18 Units, 18 Units, Subcutaneous, QDAY  lisinopriL (ZESTRIL) tablet 20 mg, 20 mg, Feeding Tube, QDAY  melatonin tablet 5 mg, 5 mg, Oral, QHS  metoprolol tartrate tablet 25 mg, 25 mg, Feeding Tube, BID  nystatin (MYCOSTATIN) oral suspension 500,000 Units, 500,000 Units, Swish & Spit, QID  petrolatum (STYE) ophthalmic ointment 0.25 inch, 0.25 inch, Both Eyes, TID  rosuvastatin (CRESTOR) tablet 40 mg, 40 mg, Feeding Tube, QDAY  sertraline (ZOLOFT) tablet 50 mg, 50 mg, Feeding Tube, QDAY  thiamine mononitrate (vit B1) tablet 100 mg, 100 mg, Per NG tube, QDAY    Continuous Infusions:  PRN and Respiratory Meds:acetaminophen Q8H PRN, bisacodyL QDAY PRN, dextrose 50% (D50) IV PRN, diclofenac sodium QID PRN, lidocaine hcl PRN, loperamide TID PRN, pancrelipase 20,880 Units/sodium bicarbonate 650 mg ([place] CLOG DESTROYER) PRN (On Call from Rx), phenoL PRN      Subjective     Seen at bedside with daughter present. Patient sleeping. Daughter reported pt participated in SLP this AM. Patient would not wake to sternal rub. Discussed infectious workup and neurology consultation with daughter.    Later in the day, physician team again evaluated at bedside and pt was alert and responsive similar to time of evaluation 5/19.     Objective                          Vital Signs: Last Filed  Vital Signs: 24 Hour Range   BP: 150/63 (05/20 1517)  Temp: 36.6 ?C (97.9 ?F) (05/20 1517)  Pulse: 78 (05/20 1517)  Respirations: 17 PER MINUTE (05/20 1517)  SpO2: 100 % (05/20 1517)  O2 Device: None (Room air) (05/20 1517) BP: (126-150)/(53-69)   Temp:  [36.3 ?C (97.4 ?F)-37.1 ?C (98.7 ?F)]   Pulse:  [78-91]   Respirations:  [16 PER MINUTE-18 PER MINUTE]   SpO2:  [95 %-100 %]   O2 Device: None (Room air)     Vitals:    10/28/23 1324   Weight: 59.7 kg (131 lb 9.8 oz)       Intake/Output Summary:  (Last 24 hours)    Intake/Output Summary (Last 24 hours) at 10/29/2023 2002  Last data filed at 10/29/2023 1626  Gross per 24 hour   Intake 596 ml   Output 400 ml   Net 196 ml        Last Bowel Movement Date: 10/29/23    Labs--reviewed  Results for orders placed or performed during the hospital encounter of 10/28/23 (from the past 24 hours)   POC GLUCOSE    Collection Time: 10/29/23 12:30 AM   # # Low-High    Glucose, POC 296 (H) 70 - 100 mg/dL   POC GLUCOSE    Collection Time: 10/29/23  5:23 AM   # # Low-High    Glucose, POC 60 (L) 70 - 100 mg/dL   POC GLUCOSE    Collection Time: 10/29/23  5:56 AM   # # Low-High    Glucose, POC 101 (H) 70 - 100 mg/dL   CBC CELLULAR THERAPEUTICS    Collection Time: 10/29/23  6:17 AM   # # Low-High    White Blood Cells 9.40 4.50 - 11.00 10*3/uL    Red Blood Cells 4.40 4.40 - 5.50 10*6/uL    Hemoglobin 13.2 (L) 13.5 - 16.5 g/dL Hematocrit 61.8 (L) 59.9 - 50.0 %    MCV 86.7 80.0 - 100.0 fL    MCH 29.9 26.0 - 34.0 pg    MCHC 34.5 32.0 - 36.0 g/dL    RDW 87.0 88.9 - 84.9 %    Platelet Count 314 150 - 400 10*3/uL    MPV 8.8 7.0 - 11.0 fL   BASIC METABOLIC PANEL CELLULAR THERAPEUTICS    Collection Time: 10/29/23  6:17 AM   # # Low-High    Sodium 141 137 - 147 mmol/L    Potassium 3.2 (L) 3.5 - 5.1 mmol/L    Chloride 105 98 - 110 mmol/L    Glucose 126 (H) 70 - 100 mg/dL    Blood Urea Nitrogen 16 7 - 25 mg/dL    Creatinine 9.28 9.59 - 1.24 mg/dL    Calcium 9.3 8.5 - 89.3 mg/dL    CO2 27 21 - 30 mmol/L    Anion Gap 9 3 - 12    Glomerular Filtration Rate (GFR) >60 >60 mL/min   POC GLUCOSE    Collection Time: 10/29/23 10:53 AM   # # Low-High    Glucose, POC 248 (H) 70 - 100 mg/dL   POC GLUCOSE    Collection Time: 10/29/23  4:20 PM   # # Low-High    Glucose, POC 253 (H) 70 - 100 mg/dL   URINALYSIS DIPSTICK REFLEX TO CULTURE    Collection Time: 10/29/23  6:27 PM    Specimen: Catheter, In and Out; Urine   # # Low-High    Color,UA Yellow  Turbidity,UA 1+ (A) Clear    Specific Gravity-Urine 1.038 (H) 1.005 - 1.030    pH,UA 6.0 5.0 - 8.0    Protein,UA 1+ (A) Negative    Glucose,UA 3+ (A) Negative    Ketones,UA Negative Negative    Bilirubin,UA Negative Negative    Blood,UA Negative Negative    Urobilinogen,UA Normal Normal    Nitrite,UA Negative Negative    Leukocytes,UA Negative Negative   URINALYSIS MICROSCOPIC REFLEX TO CULTURE    Collection Time: 10/29/23  6:27 PM    Specimen: Catheter, In and Out; Urine   # # Low-High    WBCs,UA 0 - 2 None, 0 - 2  /HPF    RBCs,UA 0 - 2 None, 0 - 2  /HPF    Mucous,UA Trace None, Trace /LPF         Physical Exam  General: no distress lying in bed sleeping  HEENT: oral mucosa moist   Heart: Extremities warm and well perfused  Lungs: Normal work of breathing on room air  Abdomen: Soft, non-tender, non-distended  Extremities: Peripheral edema is not present  Psych: asleep, later alert; concern for pt possibly pretending to sleep to avoid interacting with team reported by daughter by patient  Neuro: asleep, limited exam. With dropping arms while pt appears to be asleep, there is a supported fall leading to suspicion for conscious prevention of arm falling.   Previous exam 5/19 notable for:    Root Right Left   Shoulder Abduction C5 5- 4   Elbow Flexion C5 5 4   Elbow Extension C7 5 4   Wrist Extension C6 5 4-   Finger Flexion C8 5 4   Hip Flexion L2 4+/5 4-/5   Knee Extension L3 5 4+   Dorsiflexion L4 5 5   Plantarflexion S1 5 5      Neuro:  Cranial Nerves Right facial weakness, left facial paralysis, bilateral horizontal gaze palsy   Upper Extremity Sensation Intact to light touch bilaterally   Lower Extremity Sensation Intact to light touch bilaterally   Memory/Cognition/Speech Dysarthria. Alert and oriented to person, place, city, situation (in rehab for stroke)          Labs & Therapy Notes Reviewed    Plan discussed with attending, Dr. Laurance Corean CHRISTELLA Andria, DO   Physical Medicine & Rehabilitation  For patient care questions contact Rehab Medicine 3 FIRST CALL via Voalte.

## 2023-10-29 NOTE — Progress Notes
 PHYSICAL THERAPY  NOTE      Name: Kevin Obrien   MRN: [id number]     DOB: Jun 26, 1950      Age: 73 y.o.  Admission Date: 10/28/2023     LOS: 1 day     Date of Service: 10/29/2023         10/29/23 0800   Type of Note   Type of Note Evaluation   Time Calculation   Start Time 0800   Stop Time 0830   Time Calculation 30   $$ Pt Eval -High Complexity 1 Procedure   $$ Professional Contact 1 unit    History   Reason For Admission The patient presented to [facility] on 10/17/2023 with left gaze deviation, facial weakness and dysarthria.  CT head was negative for acute changes.  CTA head and neck without LVO.  Patient was not a TNK or EVT candidate. 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 Neurology recommend DAPT with aspirin and Plavix.  He is currently on permissive hypertension.   Previous Medical History 73 y.o. male and  has a past medical history of Accidental fall, Angina pectoris, Arthritis, BPH (benign prostatic hyperplasia), Cataract, Constipation, Coronary artery disease, DM (diabetes mellitus) (CMS-HCC), ED (erectile dysfunction), Heart attack (CMS-HCC), Hyperlipidemia, Hypertension, Joint pain, Neuropathy, and Shingles.   Subjective   Subjective Patient sidelying asleep in bed upon arrival. Increased time required to waken. Patient not verbally communicating nor open eyes throughout encounter. Back in bed at end of session.   Pain   Pain Scale No Pain   Precautions   Comments L sided weakness, visual impairment (horizontal gaze palsy)   Prior Function   Prior Functioning: Indoor Mobility (Ambulation) 3   Prior Functioning: Stairs 3   Prior Device Use (indicate device/aid prior to CURRENT illness, exacerbation or injury.) Z   Mobility Comments Per chart, had intermittently used single point cane.   Fall History Yes   Home Environment   Home Situation Lives with Family   Type of Home House   Patient Owned Equipment Single point cane   Home Layout Able to Live on Main Level w/Bedrm/Bathrm Access   Entry Stairs Details 2   Bathroom Shower / Tub Tub/Shower Unit   Range of Motion   ROM Comments patient would not let PT assess ROM of BLE or BUE   Strength   Strength Comments unable to formally assess due to decreased participation   Sensation/Tone/Coordination   Posture/Neuro Comments unable to assess   Edema   RLE Edema No significant edema   LLE Edema No significant edema   Lying to Sitting on Side of Bed   Patient Needs Assistance With Bilateral lower extremities;Trunk;Extra time to complete the activity   Assist Level Dependent   Admission: Lying to Sitting on Side of Bed  01   Equipment With rails;HOB elevated   Comments unable to come fully upright, max A to scoot hips toward edge of bed, sitting for a few seconds before patient laying trunk down to left side and unable to coax patient into sitting back up   Assessment   Assessment Patient is a 73 year old male admitted with acute  R pontomedullay ischemic stroke. Patient currently requires assist for all aspects of mobility and will benefit from skilled therapy to maximize safety and independence prior to discharge. Patient with increased lethargy and fatigue due to poor sleep overnight resulting in minimal participation this morning.   Plan   PT Plan assess transfers, trial  short distance gait as appropriate, finish IRF PAI   Recommendations   PT Discharge Recommendations Consistent supervision;Home setting with home health   PT Equipment Recommendations Too early to determine   Short Term Goals (Weekly)   Bed Mobility Goals Patient will perform supine to sit with;Patient will perform sit to supine with   Patient Will Perform Supine to Sit With Partial/moderate assistance   Patient Will Perform Sit to Supine With Partial/moderate assistance   Transfer Goals Patient will complete sit to and from stand transfer with   Patient Will Complete Sit to and From Stand Transfer With Partial/moderate assistance   Patient Will Ambulate With 50 feet;Partial/moderate assistance;With device   Patient Will Ascend/Descend 4 steps;Dependence;2 rails   Overall Team Goal   Patient Will Perform Household Mobility With Partial/moderate assistance;At wheelchair level         Therapist: Tereasa Free, PT, DPT  Date: 10/29/2023

## 2023-10-29 NOTE — Consults
 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 Yes    Consult Type: Co-Management w/Signed Orders    Ordering Provider's or Responsible Teams's Phone/Pager number? Number not on file              Assessment / Plan     Admission date and time: 10/28/2023  1:05 PM     Type 2 Diabetes Mellitus uncontrolled  Insulin dose changed  Non adherance to meds  Acute stroke   Dyslipidemia   Tube feed hyperglycemia   A1c 11.7, 10/17/2023   PTA regimen: non compliant, was supposed to be on meal time insulin. Was taking very sporadically   Was not taking any PO meds either   Hypoglycemic episodes on this regimen: none   Follows up with ? PCP for diabetes management  Diabetic-complications assessment:  CAD, stroke, neuropathy     Aspiration pneumonia    Current diet: Jevity 1.5 TF QID  Glucocorticoids: none     Diabetes Regimen Changes today based on glucose values:    Continue insulin glargine 18u every day   Chang regular insulin to Aspart and make it 15 units with each bolus feed  Change correction factor to QID with bolus feeds    Goal BG 140-180.     Diabetes Plans Post Discharge  Diabetes regimen: TBD  He will most likely need insulin on discharge.   We will request for outpatient follow-up. Family would like to come to [place] [facility]. He used to see [facility] and last visit was in 2020    Subjective:      Patient seen at the bedside. Discussed the continued Endocrine management of blood sugars. Discussed adjusting insulin slightly. They understand and did not have any questions.     Vital Signs: Last Filed In 24 Hours Vital Signs: 24 Hour Range   BP: 126/53 (05/20 0753)  Temp: 36.3 ?C (97.4 ?F) (05/20 9246)  Pulse: 80 (05/20 0753)  Respirations: 16 PER MINUTE (05/20 0753)  SpO2: 100 % (05/20 0753)  O2 Device: None (Room air) (05/20 0753) BP: (124-145)/(53-73)   Temp:  [36.3 ?C (97.4 ?F)-37.1 ?C (98.7 ?F)]   Pulse:  [72-91]   Respirations:  [16 PER MINUTE-18 PER MINUTE]   SpO2:  [95 %-  100 %]   O2 Device: None (Room air)          Peg tube feeds running  Patient is awake  Breathing on room air.  No CP      Current Facility-Administered Medications:     acetaminophen (TYLENOL EXTRA STRENGTH) tablet 1,000 mg, 1,000 mg, Feeding Tube, Q8H PRN, Fortune, Stephanie M, DO    [START ON 10/30/2023] amLODIPine (NORVASC) tablet 5 mg, 5 mg, Per NG tube, QDAY, Fortune, Stephanie M, DO    artificial tears (PF) single dose ophthalmic solution 1 drop, 1 drop, Left Eye, TID, Fortune, Stephanie M, DO, 1 drop at 10/29/23 0755    aspirin chewable tablet 81 mg, 81 mg, Per NG tube, QDAY, Fortune, Stephanie M, DO, 81 mg at 10/29/23 0754    bisacodyL (DULCOLAX) rectal suppository 10 mg, 10 mg, Rectal, QDAY PRN, Fortune, Stephanie M, DO    clopiDOGreL (PLAVIX) tablet 75 mg, 75 mg, Per NG tube, QDAY, Fortune, Stephanie M, DO, 75 mg at 10/29/23 0755    dextrose 50% (D50) syringe 25-50 mL, 12.5-25 g, Intravenous, PRN, Fortune, Stephanie M, DO    diclofenac sodium (VOLTAREN) 1 % topical gel 2 g, 2 g, Topical, QID PRN, Fortune, Stephanie M, DO    Diet Enteral Feeding Bolus, , SEE ADMIN INSTRUCTIONS, 5XDAY [phone], Given at 10/29/23 1106 **AND** Water Bolus, 100 mL, PEG Tube, Q4H, Fortune, Stephanie M, DO, 100 mL at 10/29/23 1106    enoxaparin (LOVENOX) syringe 40 mg, 40 mg, Subcutaneous, QDAY(21), Fortune, Stephanie M, DO, 40 mg at 10/28/23 2045    insulin aspart (U-100) (NOVOLOG FLEXPEN U-100 INSULIN) injection PEN 0-12 Units, 0-12 Units, Subcutaneous, Q6H, Fortune, Stephanie M, DO, 4 Units at 10/29/23 1104    insulin glargine (LANTUS SOLOSTAR U-100 INSULIN) injection PEN 18 Units, 18 Units, Subcutaneous, QDAY, Fortune, Stephanie M, DO, 18 Units at 10/28/23 1534    insulin regular human (NOVOLIN R FLEXPEN) injection PEN 20 Units, 20 Units, Subcutaneous, Q6H, Fortune, Stephanie M, DO, 20 Units at 10/29/23 0032    lidocaine hcl (XYLOCAINE) 2 % jelly (URO-JET), , Topical, PRN, Fortune, Stephanie M, DO    lisinopriL (ZESTRIL) tablet 20 mg, 20 mg, Feeding Tube, QDAY, Fortune, Stephanie M, DO, 20 mg at 10/29/23 0755    loperamide (IMODIUM A-D) capsule 2 mg, 2 mg, Feeding Tube, TID PRN, Fortune, Stephanie M, DO    melatonin tablet 5 mg, 5 mg, Oral, QHS, Fortune, Stephanie M, DO, 5 mg at 10/28/23 2045    metoprolol tartrate tablet 25 mg, 25 mg, Feeding Tube, BID, Fortune, Stephanie M, DO, 25 mg at 10/29/23 0755    nystatin (MYCOSTATIN) oral suspension 500,000 Units, 500,000 Units, Swish & Spit, QID, Fortune, Stephanie M, DO    pancrelipase 20,880 Units/sodium bicarbonate 650 mg ([place] CLOG DESTROYER), , Feeding Tube, PRN (On Call from Rx), Fortune, Stephanie M, DO    petrolatum (STYE) ophthalmic ointment 0.25 inch, 0.25 inch, Both Eyes, TID, Fortune, Stephanie M, DO, 0.25 inch at 10/28/23 2046    phenoL (CHLORASEPTIC) spray 2 spray, 2 spray, Mouth/Throat, PRN, Fortune, Stephanie M, DO    potassium chloride SR (K-DUR) tablet 40 mEq, 40 mEq, SEE ADMIN INSTRUCTIONS, ONCE, Fortune, Stephanie M, DO    rosuvastatin (CRESTOR) tablet 40 mg, 40 mg, Feeding Tube, QDAY, Fortune, Stephanie M, DO, 40 mg at 10/29/23 0756    sertraline (ZOLOFT) tablet 50 mg, 50 mg, Feeding Tube, QDAY, Fortune, Stephanie M, DO, 50 mg at 10/29/23 0756    thiamine mononitrate (vit B1) tablet  100 mg, 100 mg, Per NG tube, QDAY, Fortune, Stephanie M, DO, 100 mg at 10/29/23 0756    Basic Metabolic Profile    Lab Results   Component Value Date/Time    NA 141 10/29/2023 06:17 AM    K 3.2 (L) 10/29/2023 06:17 AM    CA 9.3 10/29/2023 06:17 AM    CL 105 10/29/2023 06:17 AM    CO2 27 10/29/2023 06:17 AM    GAP 9 10/29/2023 06:17 AM    EGFR1 >60 11/13/2020 05:51 PM    Lab Results   Component Value Date/Time    BUN 16 10/29/2023 06:17 AM    CR 0.71 10/29/2023 06:17 AM    GLU 126 (H) 10/29/2023 06:17 AM          Electronically signed by Omega MARLA Blunt, MD 10/29/2023

## 2023-10-29 NOTE — Consults
 CLINICAL NUTRITION                                                        Clinical Nutrition Initial Assessment    Name: Kevin Obrien   MRN: [id number]     DOB: Aug 16, 1950      Age: 73 y.o.  Admission Date: 10/28/2023     LOS: 1 day     Date of Service: 10/29/2023    Recommendation:  ADAT to least restrictive per SLP; currently strict NPO with ice chips only when brightly alert   EN Recs: Bolus Feeds of Jevity 1.5 x5 cartons daily; administered as 1 + 1/4 cartons ( ) QID .   Provides 1778kcal, 76g protein, free fluids  Recommend at minimum water flushes of 30mL before and after each bolus feed for tube patency.   Additional fluids per primary team, with consideration for water boluses of 100mL before and after bolus feeds (provides additional 800mL daily for total fluids of 1701ml/d)  The nutrition-related order modifications are made in communication with the primary service, who remains responsible for the orders and overall care of the patient.    Comments:  Kevin Obrien is a 73 y.o. male with PMHx hypertension, diabetes and prior CAD s/p PCI (~10 years ago) presented 5/8 with acute right facial droop, left gaze and dysarthria and stroke, pt rec'd nutrition management of EN until admitted to [facility] on 5/19 for intense rehab and ongoing deficits.     Clinical nutrition consulted for EN orders. Pt last seen in acute care 5/19 where EN regimen of Bolus feeds Jevlity 1.5x 5 cartons daily were well tolerated. G-placed 5/15. NPO for dysphagia and severe oral thrush SLP following. Loose stools at baseline prior to EN, no other notable GI distress. Evolving diabetic insulin regimen, managed by endocrine. Pt may benefit from fewer feeding boluses at discharge due to burden of care, therefore order updated to work towards goal of TID bolus feeds. Nutrition needs recalculated given new weight, of note no longer requires pro source. Per EMR, weight loss of 5% in 5 months. Per family report, pt with 20-25% wt loss in 1 year. Per Previous RD NFPE pt continues to meet malnutrition criteria. Pt is at acute nutrition risk plan for the above interventions and for ongoing monitoring and evaluation of Po intake, GI symptoms, nutr related labs and meds, clinical course, GOC, and discharge planning.    Nutrition Assessment of Patient:  Admit Weight: 59.7 kg (bed scale);  ;   ;    Wt Readings from Last 5 Encounters:   10/28/23 59.7 kg (131 lb 9.8 oz)   10/17/23 66.7 kg (147 lb)   08/30/23 61 kg (134 lb 7.7 oz)   06/06/23 62.6 kg (138 lb)   03/06/23 61.2 kg (135 lb)   Pertinent Allergies/Intolerances: NKFA     Oral Diet Order: NPO;    Current EN Order: Boluses of Jevity 1.5 x 5 cartons daily +1 prosource  Current Energy Intake: NPO           Weight Used for Calculation: 59.7 kg  Estimated Calorie Needs: 1493-1791 (25-30kcal/kg ABW)  Estimated Protein Needs: 72-85 (1.2-1.5g/kg ABW)    Malnutrition Assessment:   Malnutrition present on admission  ICD-10 code E44: Chronic illness/Moderate non-severe malnutrition      Mild  loss of muscle mass, Mild loss of body fat, Energy intake: Less than 75% of estimated energy requirement for 1 month or more            Malnutrition Interventions: EN recs to meet 100% estimated kcal and protein needs    Nutrition Focused Physical Exam:  Loss of Subcutaneous Fat: Yes; Severity: Mild; Location: Orbital, Triceps  Muscle Wasting: Yes; Severity: Moderate; Location: Clavicle, Temple, Deltoid  Physical Assessment:     Ascites: No  Pressure Injury: none documented     Comment: last bm 5/20    Nutrition Diagnosis:  Inadequate oral intake  Etiology: dysphagia 2/2 stroke  Signs & Symptoms: NPO status, SLP notes sole source EN                      Intervention / Plan:  EN recs to meet 100% estimated kcal and protein needs  Monitor/evaluation of: EN tolerance, swallow function, weight trends, labs, GI function, clinical course, and GOC.       [name], RDN, LD  Available on Voalte   [phone]

## 2023-10-29 NOTE — Progress Notes
 PHYSICAL THERAPY  NOTE      Name: Kevin Obrien   MRN: [id number]     DOB: 31-May-1951      Age: 73 y.o.  Admission Date: 10/28/2023     LOS: 1 day     Date of Service: 10/29/2023         10/29/23 1135   Type of Note   Type of Note Daily   Time Calculation   Start Time 1135   Stop Time 1150   Time Calculation 15   $$ PT Therapeutic Activity 1 unit   Subjective   Subjective Patient asleep in bed upon arrival, daughter present. Attempted to get patient out of bed but shaking head no. Physicians present at end of session.   Pain   Pain Scale No Pain   Prior Function   Vocational Full Time Employment   Fall History Yes - had a fall in March that resulted in spinal fracture which patient opted for no surgical intervention. This is when patient began to utilize cane.   Driving History Yes   Comments Per daughter, patient has no difficulty getting in and out of his car.   Home Environment   Home Situation Lives with Family   Type of Home House   Patient Owned Equipment Single point cane   Home Layout Able to Live on Main Level w/Bedrm/Bathrm Access   Entry Stairs Details 2 no rail   In Home Stairs Details 1 flight to upstairs but does not need   Financial risk analyst / Tub Tub/Shower Unit   [employer] Accessible via Wheelchair;Accessible via Vannie   Comment Patient lives with his 45 year old daughter who is in school. Has two older daughters who provide intermittent supervision, checking in as needed. Patient does not have assistance during the day. Patient inquiring about insurance coverage for caregivers.   Therapeutic Interventions   Therapeutic Activities initated timed toilet tracker, as patient requiring straight cath and having some loose bowels and urgency after tube feeds   Assessment   Assessment Patient still with ongoing sleepiness this morning and not agreeable to participate with therapy despite max encouragement from PT and daughter. Was able to get more subjective information from daughter on prior level and assist available at discharge. Likely that patient will require consistent supervision and will not have any from family during the day, as they work. Initiate timed voids to help with spontaneous voiding and bowel continence.   Plan   PT Plan assess transfers, trial short distance gait as appropriate, finish IRF PAI   Recommendations   PT Discharge Recommendations Consistent supervision;Home setting with home health   PT Equipment Recommendations Too early to determine         Therapist: Tereasa Free, PT, DPT  Date: 10/29/2023

## 2023-10-29 NOTE — Progress Notes
 OCCUPATIONAL THERAPY  NOTE   Name: Kevin Obrien   MRN: [id number]     DOB: 12-Nov-1950      Age: 73 y.o.  Admission Date: 10/28/2023     LOS: 1 day     Date of Service: 10/29/2023         10/29/23 1300   Type of Note   Type of Note Evaluation   Time Calculation   Start Time 1300   Stop Time 1430   Time Calculation 90   $$ OT Eval - High Complexity 1 Procedure   $$ Professional Contact 1 unit   $$ Phys ADL Skills 4 units   History   Reason For Admission The patient presented to [facility]23 with left gaze deviation, facial weakness and dysarthria.  CT head was negative for acute changes.  CTA head and neck without LVO.  Patient was not a TNK or EVT candidate. 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 Neurology recommend DAPT with aspirin and Plavix.  He is currently on permissive hypertension.   Previous Medical History 73 y.o. male and  has a past medical history of Accidental fall, Angina pectoris, Arthritis, BPH (benign prostatic hyperplasia), Cataract, Constipation, Coronary artery disease, DM (diabetes mellitus) (CMS-HCC), ED (erectile dysfunction), Heart attack (CMS-HCC), Hyperlipidemia, Hypertension, Joint pain, Neuropathy, and Shingles.   Subjective   Subjective Pt seated on toilet with nursing staff on OT arrival. Pt seated in bed on exit and hand off to nursing for transport to CT   Precautions   Comments L sided weakness, visual impairment (horizontal gaze palsy), PEG   Cognitive   Patient Behavior Calm;Cooperative   Family Behavior Calm;Supportive   Hearing 1   Vision 4   Prior Function   Prior Functioning: Self Care 3   Prior Functioning: Functional Cognition 3   Self Care Comments Daughter reports patient being independent with all I/ADLs prior to admission   Vocational Full Time Employment   Fall History Yes - had a fall in March that resulted in spinal fracture which patient opted for no surgical intervention. This is when patient began to utilize cane.   Driving History Yes   Home Environment   Home Situation Lives with Family   Type of Home House   Patient Owned Equipment Single point cane   Home Layout Able to Live on Main Level w/Bedrm/Bathrm Access   Entry Stairs Details 2 no rail   In Home Stairs Details 1 flight to upstairs but does not need   Financial risk analyst / Tub Tub/Shower Unit   [employer] Accessible via Wheelchair;Accessible via Vannie   Comment Patient lives with his 53 year old daughter who is in school. Has two older daughters who provide intermittent supervision, checking in as needed. Patient does not have assistance during the day. Patient inquiring about insurance coverage for caregivers.   Range of Motion   ROM Comments not formally assessed - appears to have decreased B shoulder ROM during dressing and shower - will continue to assess   Strength   Strength Position Assessed Seated   Overall Strength Generalized weakness   Strength Comments not formally assessed due to time constraints - will continue to assess   Edema   RUE Edema No Significant Edema   LUE Edema No Significant Edema   Balance   Sitting Balance Dynamic Sitting Balance;1 UE Support;Moderate Assist   Vision Screen   Patient Visual Report Diplopia-constant  (bells palsy, horizontal gaze  palsy)   Acute Visual Observation Comments unable to close L eye fully, difficulty scanning environment   Eating   Assist Level Not attempted due to medical condition or safety concerns   Admission: Eating 88   Comments NPO with PEG   Oral Hygiene   Assist Level Not attempted due to medical condition or safety concerns   Comments will assess tomorrow; may be able to complete swish and spit   Bathing   Bath/Shower Soap and water shower/bath   Patient Needs Assistance With Buttocks;Left arm;Right arm;Left lower leg and foot;Right lower leg and foot;Sitting balance   Assist Level Substantial/maximal assistance   Admission: Shower/Bathe Self 02   Position Seated in shower Comments assist for sitting balance throughout - pt able to wash hair, chest, arm pits, thighs, perineal area, and abdomen with verbal cues and increased time to complete. Required assist for rest of task   Tub/Shower Transfer   Patient Needs Assistance With Wheelchair management;Facilitation of hip flexion;Facilitation of trunk;Facilitation of weight shift;Increased time to complete;Balance;Verbal cues;Transferring on;Transferring off;Assist of two   Transfer Type Stand pivot   Assist Level Dependent   Equipment Shower chair   Comments Ax2   Upper Body Dressing   Patient Needs Assistance With Shirt: threading or unthreading head;Shirt: pulling down or up over trunk;Balance   Assist Level Substantial/maximal assistance   Admission: Upper Body Dressing 02   Position Sitting edge of bed   Comments assist for balance throughout - increased time and assist to pull down over trunk/head   Lower Body Dressing   Patient Needs Assistance With Underwear: threading or unthreading RLE;Underwear: threading or unthreading LLE;Pants: threading or unthreading RLE;Pants: threading or unthreading LLE;Pants: closure (button, zipper);Balance   Assist Level Substantial/maximal assistance   Admission: Lower Body Dressing 02   Position Sitting edge of bed   Footwear   Patient Needs Assistance With Socks: applying or removing right;Socks: applying or removing left;Balance   Assist Level Dependent   Admission: Putting on/taking off footwear 01   Position Sitting in chair   Toileting   Patient Needs Assistance With Adjusting clothing before toileting;Wiping self;Adjusting clothing after toileting;Balance   Assist Level Dependent   Admission: Toileting Hygiene 01   Position Standing for pants management;Standing for wiping   Equipment Grab bar   Comments Ax2   Toilet Transfer   Patient Needs Assistance With Financial planner;Facilitation of hip flexion;Facilitation of trunk;Facilitation of weight shift;Increased time to complete;Balance;Verbal cues;Transferring on;Transferring off;Assist of two   Transfer Type Stand pivot   Assist Level Dependent   Admission: Toilet transfer 01   Equipment Grab bar   Comments Ax2   Daily Care   Patient continent of bowel? Yes, no bowel program   Device Toilet   Stool Amount Medium   Stool Appearance Loose   Stool Color Brown   Last Bowel Movement Date 10/29/23   Sit to Lying   Patient Needs Assistance With Bilateral lower extremities;Extra time to complete the activity   Assist Level Partial/moderate assistance   Equipment With rails;HOB elevated   Lying to Sitting on Side of Bed   Patient Needs Assistance With Trunk;Extra time to complete the activity   Assist Level Partial/moderate assistance   Equipment With rails;HOB elevated   Sit to and from Stand   Sit to and from Stand Sit to stand;Stand to sit   Sit to Stand   Patient Needs Assistance With Extra time to complete activity;Financial planner;Facilitation of hip flexion;Facilitation of trunk;Facilitation of weight shift;Cues for positioning of  feet;Cues for positioning of hands;Cues for sequencing;Balance;Assist of two   Assist Level Dependent   Equipment Hand hold assist;Grab bar;Roller walker   Comments Ax2   Stand to Sit   Patient Needs Assistance With Extra time to complete activity;Financial planner;Facilitation of trunk;Facilitation of weight shift;Cues for positioning of feet;Cues for positioning of hands;Cues for sequencing;Balance;Assist of two   Assist Level Dependent   Equipment Hand hold assist;Grab bar;Roller walker   Chair/Bed-to-Chair Transfer   Chair/Bed-to-Chair Transfer Stand pivot   Stand Pivot Transfer   Patient Needs Assistance With Extra time to complete activity;Financial planner;Facilitation of hip flexion;Facilitation of trunk;Facilitation of weight shift;Cues for positioning of feet;Cues for positioning of hands;Cues for sequencing;Balance;Assist of two   Assist Level Dependent   Equipment Hand hold assist;Grab bar   Comments toilet>wheelchair>shower chair>wheelchair>bed; Ax2   Assessment   Assessment Patient is a 73 year old male admitted to [facility] with acute right pontine-medullary CVA. Patient presents with generalized weakness, impaired balance, and impaired vision requiring Ax2 for transfers and assist to complete all ADLs. Patient will benefit from intensive OT services to maximize safety and independence in I/ADLs to promote a safe discharge and decrease caregiver burden.   Plan   OT Plan finish IRF PAI, LUE NMR, vision assessment (5/23), dynamic sitting balance, progress transfers, standing balance, standing for ADLs as able, ADLs   Recommendations   OT Discharge Recommendations Consistent supervision;Home setting with home health;Versus;Skilled nursing facility   OT Equipment Recommendations Tub transfer bench   Short Term Goals (Weekly)   ADL Goals Toileting;Footwear;Lower body dressing   Patient Will Perform Toileting With Partial/moderate assistance   Patient Will Perform Footwear With Partial/moderate assistance   Patient Will Perform Lower Body Dressing With Partial/moderate assistance   Self-Care Transfers All self-care transfers   Patient Will Perform All Self-Care Transfers With Partial/moderate assistance   Overall Team Goal   Patient Will Perform Basic Cares With Partial/moderate assistance;Caregiver assist of one       Therapist: Phyliss Quale, OTD, OTR/L  Date: 10/29/2023

## 2023-10-29 NOTE — Consults
 Neurology Consult Note  Name: Shrihaan Porzio   MRN: [id number]     DOB: 16-Oct-1950      Age: 73 y.o.  Admission Date: 10/28/2023     LOS: 1 day     Date of Service: 10/29/2023       Reason for Consult:  lethargy  Consult type: Co-Management w/Signed Orders    Assessment: Diaz Crago is a 73 y.o. M w/ a hx of recent pontomedullary stroke, DM2, HLD, and PAD that neuro was consulted for worsening AMS/lethargy.    Assessment & Plan  Acute ischemic stroke (CMS-HCC)    > exam appears similar to prior to discharge  > he awoke abruptly to noxious stimuli, tracking well, denies any new deficits.  Pt's daughter reports similar lethargy with infection so this is leading consideration  > he may have a component of fluctuating sx as stroke evolves, but at this time exam is reassuring that there are not any new strokes or basilar involvement    Recs:    > agree with CTA head and neck and infectious w/u  > continue DAPT  > at this time exam appears similar to his dc exam, so no need to stroke activate or send to ED at this time  > delirium precautions  > if he develops acute focal weakness, numbness, coma, etc. Would stroke activate/send to ED as he is high risk to have progression of his stroke/basilar involvement    Will continue to follow.  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)   Third nerve palsy of left eye   Peripheral artery disease   Neuropathy   Abdominal pain   Hyperlipidemia   Diabetes mellitus (CMS-HCC)   Nephrotic range proteinuria   Postherpetic neuralgia   History of Helicobacter pylori infection   Moderate malnutrition   Acute ischemic stroke (CMS-HCC)    Total Time Today was 60 minutes in the following activities: Preparing to see the patient, discussing with primary team, reviewing CTA and MRI, reviewing labs and/or imaging, reviewing records, performing a medically appropriate examination and/or evaluation, Counseling and educating the patient/family/caregiver, and Documenting clinical information in the electronic or other health record.  Complexity of medical decision making is high.            Thank you for allowing us  to participate in the care of your patient. Please contact Neurology team on Voalte with any questions or concerns.    NEURO 2  - please contact me directly via voalte from [phone] or page [phone]  - for any other STAT questions/concerns from [phone] please page neurology on call at -[address], DO  Voalte or Page [phone]    ______________________________________________________________________________________________  Subjective    Chief complaint  lethargy    History Of Present Illness  Kevin Obrien is a 73 y.o. male presenting with increased lethargy at rehab.  Pt was recently discharged from hospital, he was fund to have increased lethargy and  unresponsiveness.  He wasn't awakening to sternal rub per rehab resident.  He was unable to participate with therapy much earlier as well.  Per family he was similar in the hospital when he had an infection.    Medical/Surgical/Social/Family History{  Past Medical History:    Accidental fall    Angina pectoris    Arthritis    BPH (benign prostatic hyperplasia)    Cataract    Constipation    Coronary artery disease    DM (diabetes mellitus) (CMS-HCC)    ED (erectile dysfunction)    Heart attack (CMS-HCC)    Hyperlipidemia    Hypertension    Joint pain    Neuropathy    Shingles     Surgical History:   Procedure Laterality Date CORONARY STENT PLACEMENT  2016    HX CHOLECYSTECTOMY  09/2015    COLONOSCOPY N/A 04/20/2016    Performed by Rogers Kipper, MD at [facility] ENDO    ESOPHAGOGASTRODUODENOSCOPY N/A 04/20/2016    Performed by Rogers Kipper, MD at [facility]    ESOPHAGOGASTRODUODENOSCOPY BIOPSY  04/20/2016    Performed by Rogers Kipper, MD at [facility] END[facility]    UREA BREATH TEST N/A 11/13/2016    Performed by Ledora Catalina, MD at [facility] ENDO     Social History     Tobacco Use    Smoking status: Former     Current packs/day: 0.00     Average packs/day: 2.0 packs/day for 10.0 years (20.0 ttl pk-yrs)     Types: Cigarettes     Start date: 04/11/1961     Quit date: 04/12/1971     Years since quitting: 52.5    Smokeless tobacco: Never    Tobacco comments:     Former smoker/quit years ago   Vaping Use    Vaping status: Never Used   Substance and Sexual Activity    Alcohol use: Not Currently    Drug use: Never    Sexual activity: Not Currently     Partners: Female     Birth control/protection: None           Family History   Problem Relation Name Age of Onset    Diabetes Mother AMPARO CUBIAS     Diabetes Father JOSE Avans     Cancer-Lung Father JOSE Gierke     Melanoma Neg Hx      Cataract Neg Hx      Glaucoma Neg Hx      Macular Degen Neg Hx       Allergies: Seasonal allergies  Current Medications    Medication Directions   amLODIPine (NORVASC) 10 mg tablet one tablet by Per NG tube route daily. Indications: high blood pressure   amoxicillin-potassium clavulanate (AUGMENTIN) 875/125 mg tablet Take one tablet via feeding tube twice daily with meals for 4 days. Indications: a lower respiratory infection   artificial tears (PF) single dose ophthalmic solution Apply one drop to left eye as directed three times daily. Indications: dry eye   aspirin 81 mg chewable tablet Take one tablet via feeding tube daily. Indications: stroke prevention   clopiDOGreL (PLAVIX) 75 mg tablet one tablet by Per NG tube route daily for 82 days. Indications: prevention for a blood clot going to the brain   insulin aspart (U-100) (NOVOLOG FLEXPEN U-100 INSULIN) 100 unit/mL (3 mL) PEN Inject zero Units to twelve Units under the skin every 6 hours. Correction Factor if: BS 181-220=2 units, BS 221-260=4 units, BS 261-300=6 units, BS 301-350=8 units, BS 351-400=10 units,  BS >400=12 units  Indications: type 2 diabetes mellitus   insulin glargine (LANTUS SOLOSTAR U-100 INSULIN) 100 unit/mL (3 mL) subcutaneous PEN Inject eighteen Units under the skin daily. Indications: type 2 diabetes mellitus   insulin regular human (NOVOLIN R FLEXPEN) 100 unit/mL (3 mL) injection PEN Inject twenty Units under the skin every 6 hours. Indications: type 2 diabetes mellitus   lisinopriL (ZESTRIL) 20 mg tablet Take one tablet via feeding tube daily. Indications: high blood pressure   loperamide (IMODIUM) 1 mg/7.5 mL oral solution Take 15 mL by mouth three times daily as needed. Indications: diarrhea   melatonin 5 mg tablet Take one tablet by mouth at bedtime daily. Indications: difficulty sleeping   metoprolol tartrate 25 mg tablet Take one tablet via feeding tube twice daily. Indications: high blood pressure   petrolatum (STYE) 57.7-31.9 % oint ophthalmic ointment Apply one-quarter inch to both eyes three times daily. Indications: dry eye   rosuvastatin (CRESTOR) 40 mg tablet Take one tablet via feeding tube daily. Indications: hardening of the arteries due to plaque buildup   sertraline (ZOLOFT) 20 mg/mL oral concentrate solution Take 2.5 mL via feeding tube daily. Use the dropper to measure the prescribed amount of sertraline and mix with 120 mL (4 oz) of water. Administer immediately after preparing; DO NOT MIX IN ADVANCE.  Indications: anxiousness associated with depression   thiamine mononitrate (vit B1) 100 mg tablet Take one tablet via feeding tube daily. Indications: deficiency in thiamine or vitamin B1       Objective                        Vital Signs: Last Filed                 Vital Signs: 24 Hour Range   BP: 150/63 (05/20 1517)  Temp: 36.6 ?C (97.9 ?F) (05/20 1517)  Pulse: 78 (05/20 1517)  Respirations: 17 PER MINUTE (05/20 1517)  SpO2: 100 % (05/20 1517)  O2 Device: None (Room air) (05/20 1517) BP: (126-150)/(53-69)   Temp:  [36.3 ?C (97.4 ?F)-37.1 ?C (98.7 ?F)]   Pulse:  [78-91]   Respirations:  [16 PER MINUTE-18 PER MINUTE]   SpO2:  [95 %-100 %]   O2 Device: None (Room air)     Vitals:    10/28/23 1324   Weight: 59.7 kg (131 lb 9.8 oz)         Intake/Output Summary:  (Last 24 hours)    Intake/Output Summary (Last 24 hours) at 10/29/2023 1634  Last data filed at 10/29/2023 1626  Gross per 24 hour   Intake 396 ml   Output 1050 ml   Net -654 ml              Physical Exam  General physical exam:  HEENT: normocephalic, eyes open with no discharge, nares patent, oropharynx is clear with no lesions, palate intact    Neuro exam:   Mental status: Drowsy, opening eyes to noxious stimuli quickly. Tracks well and following commands, no aphasia    Speech: Dysarthria   Cranial Nerves:     Normal Abnormal   II Pupils reactive, visual fields normal     III, IV, VI   Patient does not move eyes to the left or right, vertical eye movements intact   V Sensation nml V1-V3     VII   Left upper and lower facial weakness   VIII nml to conversation     IX, X +strong  cough     XI Equal shoulder shrug     XII Tongue midline     Muscle/motor: 5/5 in RUE and RLE, 5/5 in LLE, 4/5 in LUE/LLE  Sensation: Normal in bilateral upper and lower extremities, no abnormalities    Coordination: no dysmetria on FTN, left side suspect is due to weakness no true dymsetria  RAM intact b/l     Lab/Radiology/Other Diagnostic Tests:  Results for orders placed or performed during the hospital encounter of 10/28/23 (from the past 24 hours)   POC GLUCOSE    Collection Time: 10/29/23 12:30 AM   Result Value Ref Range    Glucose, POC 296 (H) 70 - 100 mg/dL   POC GLUCOSE    Collection Time: 10/29/23  5:23 AM   Result Value Ref Range    Glucose, POC 60 (L) 70 - 100 mg/dL POC GLUCOSE    Collection Time: 10/29/23  5:56 AM   Result Value Ref Range    Glucose, POC 101 (H) 70 - 100 mg/dL   CBC CELLULAR THERAPEUTICS    Collection Time: 10/29/23  6:17 AM   Result Value Ref Range    White Blood Cells 9.40 4.50 - 11.00 10*3/uL    Red Blood Cells 4.40 4.40 - 5.50 10*6/uL    Hemoglobin 13.2 (L) 13.5 - 16.5 g/dL    Hematocrit 61.8 (L) 40.0 - 50.0 %    MCV 86.7 80.0 - 100.0 fL    MCH 29.9 26.0 - 34.0 pg    MCHC 34.5 32.0 - 36.0 g/dL    RDW 87.0 88.9 - 84.9 %    Platelet Count 314 150 - 400 10*3/uL    MPV 8.8 7.0 - 11.0 fL   BASIC METABOLIC PANEL CELLULAR THERAPEUTICS    Collection Time: 10/29/23  6:17 AM   Result Value Ref Range    Sodium 141 137 - 147 mmol/L    Potassium 3.2 (L) 3.5 - 5.1 mmol/L    Chloride 105 98 - 110 mmol/L    Glucose 126 (H) 70 - 100 mg/dL    Blood Urea Nitrogen 16 7 - 25 mg/dL    Creatinine 9.28 9.59 - 1.24 mg/dL    Calcium 9.3 8.5 - 89.3 mg/dL    CO2 27 21 - 30 mmol/L    Anion Gap 9 3 - 12    Glomerular Filtration Rate (GFR) >60 >60 mL/min   POC GLUCOSE    Collection Time: 10/29/23 10:53 AM   Result Value Ref Range    Glucose, POC 248 (H) 70 - 100 mg/dL   POC GLUCOSE    Collection Time: 10/29/23  4:20 PM   Result Value Ref Range    Glucose, POC 253 (H) 70 - 100 mg/dL     Radiology  (Last 24 hours)                 10/29/23 1459  CHEST SINGLE VIEW Final result    Impression:      No increasing consolidation.            Finalized by Lonni Finder, M.D. on 10/29/2023 3:29 PM. Dictated by Lonni Finder, M.D. on 10/29/2023 3:28 PM.       10/29/23 1455  CTA HEAD WO/W CONT Final result    Impression:          CTA head:       1.  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   2.  Otherwise grossly similar  multifocal intracranial arterial stenoses, greatest of moderate-severe degree involving the bilateral MCA and PCA vasculature.   3.  Evolving recent brainstem infarcts better demonstrated on comparison MRI.   4.  Otherwise unchanged mild burden of chronic small vessel ischemia with small chronic left corona radiata-anterior basal ganglia and right periatrial lacunar type infarcts.       CTA neck:       1.  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   2.  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 Multifocal mild and osseous of the left cervical vertebral artery.             Finalized by Burnard July, D.O. on 10/29/2023 3:32 PM. Dictated by Burnard July, D.O. on 10/29/2023 3:01 PM.       10/29/23 1455  CTA NECK WO/W CONT Final result    Impression:          CTA head:       1.  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   2.  Otherwise grossly similar multifocal intracranial arterial stenoses, greatest of moderate-severe degree involving the bilateral MCA and PCA vasculature.   3.  Evolving recent brainstem infarcts better demonstrated on comparison MRI.   4.  Otherwise unchanged mild burden of chronic small vessel ischemia with small chronic left corona radiata-anterior basal ganglia and right periatrial lacunar type infarcts.       CTA neck:       1.  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   2.  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 Multifocal mild and osseous of the left cervical vertebral artery.             Finalized by Burnard July, D.O. on 10/29/2023 3:32 PM. Dictated by Burnard July, D.O. on 10/29/2023 3:01 PM.               I personally reviewed the following CTA-  no changes from past CTA, basilar is open, right vert still occluded  24-hour labs:    Results for orders placed or performed during the hospital encounter of 10/28/23 (from the past 24 hours)   POC GLUCOSE    Collection Time: 10/29/23 12:30 AM   Result Value Ref Range    Glucose, POC 296 (H) 70 - 100 mg/dL   POC GLUCOSE    Collection Time: 10/29/23  5:23 AM   Result Value Ref Range    Glucose, POC 60 (L) 70 - 100 mg/dL   POC GLUCOSE    Collection Time: 10/29/23  5:56 AM   Result Value Ref Range    Glucose, POC 101 (H) 70 - 100 mg/dL   CBC CELLULAR THERAPEUTICS    Collection Time: 10/29/23  6:17 AM   Result Value Ref Range    White Blood Cells 9.40 4.50 - 11.00 10*3/uL    Red Blood Cells 4.40 4.40 - 5.50 10*6/uL    Hemoglobin 13.2 (L) 13.5 - 16.5 g/dL    Hematocrit 61.8 (L) 40.0 - 50.0 %    MCV 86.7 80.0 - 100.0 fL    MCH 29.9 26.0 - 34.0 pg    MCHC 34.5 32.0 - 36.0 g/dL    RDW 87.0 88.9 - 84.9 %    Platelet Count 314 150 - 400 10*3/uL    MPV 8.8 7.0 - 11.0 fL  BASIC METABOLIC PANEL CELLULAR THERAPEUTICS    Collection Time: 10/29/23  6:17 AM   Result Value Ref Range    Sodium 141 137 - 147 mmol/L    Potassium 3.2 (L) 3.5 - 5.1 mmol/L    Chloride 105 98 - 110 mmol/L    Glucose 126 (H) 70 - 100 mg/dL    Blood Urea Nitrogen 16 7 - 25 mg/dL    Creatinine 9.28 9.59 - 1.24 mg/dL    Calcium 9.3 8.5 - 89.3 mg/dL    CO2 27 21 - 30 mmol/L    Anion Gap 9 3 - 12    Glomerular Filtration Rate (GFR) >60 >60 mL/min   POC GLUCOSE    Collection Time: 10/29/23 10:53 AM   Result Value Ref Range    Glucose, POC 248 (H) 70 - 100 mg/dL   POC GLUCOSE    Collection Time: 10/29/23  4:20 PM   Result Value Ref Range    Glucose, POC 253 (H) 70 - 100 mg/dL

## 2023-10-29 NOTE — Case Management (ED)
 Case Management Progress Note    NAME:Kevin Obrien                          MRN: [id number]              DOB:22-Jul-1950          AGE: 73 y.o.  ADMISSION DATE: 10/28/2023             DAYS ADMITTED: LOS: 1 day      Today's Date: 10/29/2023    PLAN:   Discharge planning on-going.  Does not have consistent care at home.     Expected Discharge Date: 11/19/2023   Is Patient Medically Stable: No, Please explain: Rehab goals  Are there Barriers to Discharge? no    INTERVENTION/DISPOSITION:  Discharge Planning                 Attended team conference.  SW met with pt's daughter, [name], at bedside and spoke with other daughter, [name], on the phone. Pt was sleeping.    Pt lives at home with girlfriend and daughter who is 78 years old. Girlfriend works outside of the home. Pt's adult's daughters work and can't provide consistent supervision. [name] does provide oversight and checks on him daily.    Per daughters, pt would not have consistent supervision. They asked about caregivers through insurance. Pt only has [place] Medicaid. For him to be assessed for Frail and Elderly Waiver he would need to have medical Medicaid. SW sent referral to [facility]. They would not be able to pay out of pocket. Pt will likely need to go to SNF.    Transportation                 Support                 Info or Referral                 Positive SDOH Domains and Potential Barriers                   Medication Needs                                                                                                                                                         Estate manager/land agent                 Other                 Discharge Disposition  [name] [name], LMSW   Available on voalte  [phone]      Case Management Admission Assessment     NAME:Kevin Obrien MRN: [id number]             DOB:[birth year]          AGE: 73 y.o.  ADMISSION DATE: 10/17/2023             DAYS ADMITTED: LOS: 1 day      Today?s Date: 10/18/2023     Source of Information: Patient via Spanish interpreter ID# [id number] and pt's daughter, [name] Hanly        HPI  73 y.o. male with hypertension, diabetes and prior CAD s/p PCI (~10 years ago) presented with acute right facial droop, left gaze and dysarthria and was stroke activated. Initial NIH was 6, and the LKW was the night before. His BP at arrival was 210/85. CT head was negative for acute changes, CTA H&N showed no LVO but significant calcified athero and diffuse intracranial vascular stenoses.  He did not receive TNK due to being outo f window and no EVT due to no LVO.     This CM met with pt for assessment on this date.  Provided explanation of SW/NCM roles.  Provided opportunity for questions and discussion. Pt/family encouraged to contact Case Management team with questions and concerns during hospitalization and until patient is able to transition back to the patient's primary care physician.  Plan  Plan: Case Management Assessment, Assist PRN with SW/NCM Services  Most recent therapy recommendations:  PT: Inpatient setting;Recommend rehab medicine consult   OT: Inpatient setting;Recommend rehab medicine consult   ST: NPO. Consider short term non-oral nutrition   NCM/SW team to continue to follow patient's plan of care via EMR and team huddle; will assist with discharge planning needs as indicated.     Assessment Notes  Patient is agreeable to completing assessment at this time.   Patient encouraged to contact case management with questions and concerns during hospitalization.   Patient lives with his significant other and 7 y.o. daughter in a single family home that accomodates 1 level living. Patient is independent with all ADLs and IADLs at baseline.  Home support is assessed to be intermittent vs consistent.  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  Patient does not have a healthcare DPOA and declined to complete one at this time.  Met with patient/family to discuss post-acute service recommendations which include:IPR. Provided education to patient/family on available choices, quality data and offered opportunity for questions.  The following choice list(s) provided to patient/family: CM IPR list with Quality Data.  Patient/family requested the following referrals be sent: [facility]. SWCM to send referral.  Anticipate discharge to an acute IPR hospital when medically stable.     Patient Address/Phone  [address]  [place] [address]  [phone] (home)      Emergency Contact  Extended Emergency Contact Information  Primary Emergency Contact: Portlock,[name]  Address: [address]           [place], [place] [address] [place]   Home Phone: [phone]  Mobile Phone: [phone]  Relation: Daughter  Interpreter needed? No  Secondary Emergency Contact: Guerrero,Nora   [place]   Home Phone: [phone]  Mobile Phone: [phone]  Relation: Daughter  Interpreter needed? No     Healthcare Directive  Transportation  Does the Patient Need Case Management to Arrange Discharge Transport? (ex: facility, ambulance, wheelchair/stretcher, Medicaid, cab, other): No  Will the Patient Use Family Transport?: Yes  Transportation Name, Phone and Availability #1: pt's daughters     Expected Discharge Date  10/21/2023      Living Situation Prior to Admission  Living Arrangements  Type of Residence: Home, independent  Living Arrangements: Spouse/significant other, Children  Financial risk analyst / Tub: Tub/Shower Unit  How many levels in the residence?: 1  Can patient live on one level if needed?: Yes  Does residence have entry and/or inside stairs?: Yes (2 STE)  Assistance needed prior to admit or anticipated on discharge: Yes  Who provides assistance or could if needed?: pt's SO, 20 y.o. daughter, and two adult daughters  Are they in good health?: Yes  Can support system provide 24/7 care if needed?: Maybe  Level of Function   Prior level of function: Independent  Cognitive Abilities   Cognitive Abilities: Alert and Oriented, Continue to Assess     Financial Resources  Coverage  Primary Insurance: Medicare Replacement  Secondary Insurance: Medicaid  Medicaid State: [place]   Additional Coverage: None  Medication Coverage    Medication Coverage: Medicare Part D, Medicaid  Have you experienced a noticeable increase in your copay costs recently?: No  Are current medications affordable?: Yes  Do You Use a Co-Pay Card or a Medication Assistance Program to Help Manage Medication Costs?: No  Do You Manage Your Own Medications?: Yes  Source of Income   Source Of Income: SSI, Employed  Financial Assistance Needed?  none     Psychosocial Needs  Mental Health  Mental Health History: No  Substance Use History  Substance Use History Screen: No  Other  none     Current/Previous Services  PCP  Bernardino Velma SAUNDERS, [phone], [phone]  Pharmacy     [facility] [address] [place], [place] - [address]  [address]  [facility] [place] [address]  Phone: [phone] Fax: [phone]     [facility]  [address]  [place]  [place] [address]  Phone: [phone] Fax: [phone]     Durable Medical Equipment   Durable Medical Equipment at home: Single [facility]  Receiving home health: No  Hemodialysis or Peritoneal Dialysis  Undergoing hemodialysis or peritoneal dialysis: No  Tube/Enteral Feeds  Receive tube/enteral feeds: No  Infusion  Receive infusions: No  Private Duty  Private duty help used: No  Home and Community Based Services  Home and community based services: No  Ryan White  Ryan White: N/A  Hospice  Hospice: No  Outpatient Therapy  PT: No  OT: No  SLP: No  Skilled Nursing Facility/Nursing Home  SNF: No  NH: No  Inpatient Rehab  IPR: No  Long-Term Acute Care Hospital  LTACH: No  Acute Hospital Stay  Acute Hospital Stay: In the past  Was patient's stay within the last 30 days?: No

## 2023-10-29 NOTE — Progress Notes
 Blood sugar readings this morning at 60.  Scheduled Jevity 1.5 enteral feeding given and on call physician notified. Per the on call If the next reading in 15 minutes is not improved then given the 25 ml Dextrose in water. The blood sugar recheck after 15 minutes was 101.

## 2023-10-30 LAB — URINALYSIS MICROSCOPIC REFLEX TO CULTURE
~~LOC~~ BKR RBC, UA: 0 /HPF — AB
~~LOC~~ BKR WBC, UA: 0 /HPF (ref 5.0–8.0)

## 2023-10-30 LAB — URINALYSIS DIPSTICK REFLEX TO CULTURE
~~LOC~~ BKR LEUKOCYTES: NEGATIVE
~~LOC~~ BKR NITRITE: NEGATIVE
~~LOC~~ BKR URINE BILE: NEGATIVE
~~LOC~~ BKR URINE BLOOD: NEGATIVE
~~LOC~~ BKR URINE KETONE: NEGATIVE
~~LOC~~ BKR URINE SPEC GRAVITY: 1 — ABNORMAL HIGH (ref 1.005–1.030)

## 2023-10-30 LAB — BASIC METABOLIC PANEL CELLULAR THERAPEUTICS
~~LOC~~ BKR ANION GAP: 10 10*3/uL — ABNORMAL LOW (ref 3–12)
~~LOC~~ BKR CALCIUM: 9 mg/dL (ref 8.5–10.6)
~~LOC~~ BKR CO2: 25 mmol/L — ABNORMAL HIGH (ref 21–30)
~~LOC~~ BKR GLOMERULAR FILTRATION RATE (GFR): >60 mL/min — ABNORMAL LOW (ref >60–0.20)

## 2023-10-30 LAB — CBC CELLULAR THERAPEUTICS
~~LOC~~ BKR HEMATOCRIT: 35 % — ABNORMAL LOW (ref 40.0–50.0)
~~LOC~~ BKR HEMOGLOBIN: 12 g/dL — ABNORMAL LOW (ref 13.5–16.5)
~~LOC~~ BKR MCH: 29 pg — ABNORMAL LOW (ref 26.0–34.0)
~~LOC~~ BKR MCHC: 34 g/dL — ABNORMAL LOW (ref 32.0–36.0)
~~LOC~~ BKR MCV: 85 fL — ABNORMAL HIGH (ref 80.0–100.0)
~~LOC~~ BKR MPV: 8.6 fL — ABNORMAL HIGH (ref 7.0–11.0)
~~LOC~~ BKR PLATELET COUNT: 302 10*3/uL — ABNORMAL LOW (ref 150–400)
~~LOC~~ BKR RBC COUNT: 4.1 10*6/uL — ABNORMAL LOW (ref 4.40–5.50)
~~LOC~~ BKR RDW: 12 % — ABNORMAL HIGH (ref 11.0–15.0)
~~LOC~~ BKR WBC COUNT: 7.5 10*3/uL — ABNORMAL LOW (ref 4.50–11.00)

## 2023-10-30 LAB — POC GLUCOSE
~~LOC~~ BKR POC GLUCOSE: 294 mg/dL — ABNORMAL HIGH (ref 70–100)
~~LOC~~ BKR POC GLUCOSE: 136 mg/dL — ABNORMAL HIGH (ref 70–100)
~~LOC~~ BKR POC GLUCOSE: 223 mg/dL — ABNORMAL HIGH (ref 70–100)
~~LOC~~ BKR POC GLUCOSE: 259 mg/dL — ABNORMAL HIGH (ref 70–100)

## 2023-10-30 MED ORDER — DOXAZOSIN 2 MG PO TAB
1 mg | Freq: Every evening | GASTROSTOMY | 0 refills | Status: DC
Start: 2023-10-30 — End: 2023-11-01
  Administered 2023-10-31 – 2023-11-01 (×2): 1 mg via GASTROSTOMY

## 2023-10-30 MED ORDER — TAMSULOSIN 0.4 MG PO CAP
.4 mg | Freq: Every day | ORAL | 0 refills | Status: DC
Start: 2023-10-30 — End: 2023-10-30

## 2023-10-30 NOTE — Progress Notes
 SPEECH-LANGUAGE PATHOLOGY  COGNITIVE ASSESSMENT   Name: Kevin Obrien   MRN: [id number]     DOB: 1951-01-06      Age: 73 y.o.  Admission Date: 10/28/2023     LOS: 2 days     Date of Service: 10/30/2023        EVALUATION SUMMARY  Overall Cognitive Severity Level: Severe  Pt seen for cognitive communication evaluation this date. Utilized brief cognitive test due to  severity of pt's deficits, alertness level, language barrier. Pt declined interpreter services when offered which could have been contributing factor to performance. Administered the Addenbrooke's Cognitive Examination - III (ACE-III) Version A which is a brief cognitive test that assess five cognitive domains: attention, memory, verbal fluency, language, and visuospatial abilities. Pt received a score of 30/100. 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 Relevant strengths observed in the areas of orientation and simple language tasks, while difficulty noted in the areas of immediate/delayed recall, attention, visuospatial skills, and language processing/fluency. At [facility], pt lived with girlfriend and 10 year old daughter who provided intermittent supervision for iADL's including finances, medication management, and cooking/cleaning. Pt reported to still be driving and employed part-time as Merchandiser, retail for [employer]. Current level of functioning is judged to be a  change from cognitive baseline. No family present at time of evaluation. Please see further details below.    Prognosis: Good  Plan: Continue Treatment Daily  Results Reported to Physician: Yes -- via EMR    Pt additionally seen for dysphagia tx following completion of cognitive assessment. Oral care completed prior with toothbrush/toothpaste. Pt required physical set-up with task and intermittent cues for sequencing/attention. Pt provided w/ PO trials ice chips x2, thin liquids via cup x2, thin liquids via straw x2, mildly thick liquids via cup x2. Pt presented with consistent cough (immediate/delayed) for all PO trials thin/mildly thick liquids. Occasional L-sided anterior loss noted via cup sips + following swallow indicating suspected incomplete clearance with bolus transit. PO trials ending 2/2 increased coughing.    CURRENT RECOMMENDATIONS:  SLP will continue to follow for ongoing assessment and monitoring of cognitive skills during admission (60 min/day)  Consistent supervision recommended upon discharge as anticipate patient's impaired safety awareness and/or problem solving (or communication) will impact their ability to call for help  Consistent supervision recommended upon discharge as anticipate patient's impaired memory & attention will potentially impact their safety  Anticipate pt may require assistance with medication/finance management and meal preparation upon discharge 2/2 pt's impaired attention and/or memory   At this time, anticipate pt will benefit from ongoing ST services at the next level of care     Addenbrooke's Cognitive Examination - III (ACE-III) - Version A, completed 10/30/23  Domain Score Subtests Comments   Attention 6/18 Orientation: 5/10  Registration of 3 items: 2/3  Serial 7 subtraction: 1/5 Orientation: responsive to logical/environmental cues   Memory 10/26 Recall of 3 items: 0/3  Immediate recall of address: 3/7  Retrograde memory: 3/4  Delayed recall of address: 4/12 Choice cues partially effective for delayed recall of address   Verbal Fluency 0/14 Initial letter: 0/7  Animals: 0/7 Attempted; anticipate attention + processing deficits contributing factor to performance   Language 14/26 Following commands: 2/3  Sentence writing: 2/2  Repetition: 2/4  Picture naming: 7/12  Picture comprehension: 1/4  Reading: 0/1 Repetition: dysarthria impacting accuracy  Picture naming: word-finding difficulty noted + unfamiliarity of pictures  Reading: attempted and able to read target words with 3/5  accuracy; vision deficits likely contributing factor   Visuospatial 0/16 Figure copy: 0/3  Clock drawing: 0/5  Counting dots: 0/4  Identifying letters: 0/4 Attempted all tasks   Total ACE-III Score 30/100         PRAGMATICS  Comments: Appropriate eye contact and affect for the situation. Notable fatigue with occasional reduced alertness but remained participatory with cues/stimuli.    BEHAVIOR  Comments: Pt alert and cooperative, agreeable to evaluation. Increased processing time and need for repetition of information noted. No impulsive behaviors observed. Pt denied cognitive communication changes compared to PLOF.     ORIENTATION  Comments: Pt alert and oriented to all current concepts. Pt scored a 23/30 on the O-Log. Decline in score from previous administration but receptive towards logical/environmental cues.    LANGUAGE  Comments: No focal language deficits noted. Auditory comprehension appeared functional for 1 step commands; however, increased difficulty observed for multi-step/complex commands. No significant word finding difficulties noted in structured conversation however pt did demonstrate some difficulty with picture naming. Anticipate cognitive deficits impacting language fluency; will consider language assessment in subsequent sessions as determined necessary.    ST GOALS FOR STAY:  See Rehab SLP Goals docflowsheet for information regarding goals.    PLAN FOR NEXT VISIT:  -- O-log  -- Simple attention/memory  -- Visual scanning  -- Medication management  -- Finance management    Dysphagia:  -- PO trials thin/mildly thick liquids, pureed solids  -- Initiate EMST  -- Consider IOPI      Subjective  Reason for Admission and Past Medical Hx: [name] is a 73 y.o. male and has a past medical history of Accidental fall, Angina pectoris, Arthritis, BPH (benign prostatic hyperplasia), Cataract, Constipation, Coronary artery disease, DM (diabetes mellitus) (CMS-HCC), ED (erectile dysfunction), Heart attack (CMS-HCC), Hyperlipidemia, Hypertension, Joint pain, Neuropathy, and Shingles. The patient presented to [facility]2023 with left gaze deviation, facial weakness and dysarthria. CT head was negative for acute changes. CTA head and neck without LVO. Patient was not a TNK or EVT candidate. 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 Neurology recommend DAPT with aspirin and Plavix. He is currently on permissive hypertension. The patient's hospital course is functionally complicated by impaired mobility and ADLs.  Special Considerations: Feeding tube present  Persons Present: Speech Therapist    Imaging  CTA HEAD WO/W CONT  Result Date: 10/29/2023  CTA head: 1.  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 2.  Otherwise grossly similar multifocal intracranial arterial stenoses, greatest of moderate-severe degree involving the bilateral MCA and PCA vasculature. 3.  Evolving recent brainstem infarcts better demonstrated on comparison MRI. 4.  Otherwise unchanged mild burden of chronic small vessel ischemia with small chronic left corona radiata-anterior basal ganglia and right periatrial lacunar type infarcts. CTA neck: 1.  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 2.  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 Multifocal mild and osseous of the left cervical vertebral artery.  Finalized by Burnard July, D.O. on 10/29/2023 3:32 PM. Dictated by Burnard July, D.O. on 10/29/2023 3:01 PM.    MRI HEAD WO/W CONTRAST  Result Date: 10/28/2023  MRI: 1.  Redemonstration of evolving acute-subacute pontomedullary infarcts, with increased conspicuity of ischemia in the posterior brain stem since the comparison examination. 2.  Punctate focus of right lateral precentral gyral cortical enhancement without corresponding signal abnormality on additional sequences, potentially reflecting incidental  vascular enhancement. Tiny subacute enhancing infarct could appear similar. 3.  Stable mild generalized cerebral and cerebellar volume loss, moderate chronic microvascular ischemic cerebral white matter changes, and multiple superimposed scattered superimposed chronic infarcts. MRA: 1.  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 2.  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 No vessel wall edema or enhancement to suggest active arterial wall inflammation.  Finalized by Rockey Gin, DO on 10/28/2023 8:18 AM. Dictated by Rockey Gin, DO on 10/28/2023 7:51 AM.      Education  Persons Educated: Patient  Barriers To Learning: Cognitive Deficits, Language Barrier, Family Not Present  Interventions: Staff Educated  Teaching Methods: Verbal  Topics: Memory  Patient Response: Verbalized Understanding, More Instruction Required  Goal Formulation: With Patient      Therapist: Miriam Dills, MA CCC-SLP  Date: 10/30/2023

## 2023-10-30 NOTE — Progress Notes
 OCCUPATIONAL THERAPY  NOTE   Name: Kevin Obrien   MRN: [id number]     DOB: March 17, 1951      Age: 73 y.o.  Admission Date: 10/28/2023     LOS: 2 days     Date of Service: 10/30/2023         10/30/23 0830   Type of Note   Type of Note Daily   Time Calculation   Start Time 0830   Stop Time 0930   Time Calculation 60   $$ Therapeutic Activity 4 unit - 60 min   Subjective   Subjective Pt seated in bed on arrival/exit with alarm on and needs met/in reach   Vitals*   Pulse 67   BP Patient Position Head of bed (Comment degree)   BP (!) 140/59   O2 Device None (Room air)   SpO2 98 %   Precautions   Comments L sided weakness, visual impairment (horizontal gaze palsy), PEG   Cognitive   Orientation Oriented x4   Patient Behavior Other (Comment)  (Lethargic)   Cognition Decrease attention/ concentration   Eating   Patient Needs Assistance With Scooping;Bringing to mouth;Scanning to locate food on plate   Assist Level Substantial/maximal assistance   Admission: Eating 02   Comments ice chips   Oral Hygiene   Admission: Oral Hygiene 02   Comments clinical judgement   Sit to Lying   Patient Needs Assistance With Bilateral lower extremities;Extra time to complete the activity   Assist Level Partial/moderate assistance   Equipment With rails;HOB elevated   Lying to Sitting on Side of Bed   Patient Needs Assistance With Trunk;Extra time to complete the activity   Assist Level Partial/moderate assistance   Equipment With rails;HOB elevated   Comments Reporting dizziness when seated EOB. Assessed vitals (see flow sheet).   Therapeutic Interventions   Other Session spent waking patient and sitting edge of bed. Pt very lethargic this session. Required moderate to maximal assist to maintain balance while seated EOB. Pt declining to attempt stand with use of sara steady using hand gestures to say wait or not right now. Pt reporting feeling nervous about doing a transfer without family member present as he is worried about falling. Therapist provided therapeutic listening and education/reassurance on safety during transfers with therapy. Therapist discussing goals and pt reporting wanting to return to work and be able to care for himself again.   Assessment   Assessment Patient continues to require assist for sitting balance and lethargic for session. Would benefit from session later in the morning if able. Continue progressing towards goals   Plan   OT Plan LUE NMR, vision assessment (5/23), dynamic sitting balance, progress transfers, standing balance, standing for ADLs as able, ADLs   Recommendations   OT Discharge Recommendations Consistent supervision;Home setting with home health;Versus;Skilled nursing facility   OT Equipment Recommendations Tub transfer bench         Therapist: Phyliss Quale, OTD, OTR/L  Date: 10/30/2023

## 2023-10-30 NOTE — Progress Notes
 REC THERAPY ASSESSMENT NOTE     Name: Kevin Obrien   MRN: [id number]     DOB: Oct 20, 1950      Age: 73 y.o.  Admission Date: 10/28/2023     LOS: 2 days     Date of Service: 10/30/2023      Subjective Statement: Patient supine, agrees to session. Gestures needing to clean face.    Patient Goals: Get well  Date of Admission: 10/28/2023    LEISURE BEHAVIORS/BELIEFS:    How much time do you have per day or week for leisure time? Daily  Do you prefer to spend your leisure time alone or with others? Alone and With Others  What type of transportation do you have or use? Car  Would you like more information on community resources? Will follow up.    Leisure Inventory:   Listening to music, preferred genre: Spanish  Spend time outside  Watching TV, preferred genre: Merchant navy officer    Identify two people who are important to you:  1) Kevin Obrien (daughter)  2) Kevin Obrien (daughter)    Do you have many friends? Yes    Name one activity you and your friends participate in: Talk.     Do you have difficulty: Expressing yourself and Paying attention    ASSESSMENT: Patient communicated with gestures, limited verbalization. Gestured playing guitar to indicate guitar playing. Increased time for processing. Likes playing guitar, being outside, and watching TV.  Per chart review, patient operated cleaning business. Patient reports having 8 children, indicated by using fingers to count. Provided sound machine and lamp at bedside. Patient resting on departure.     PLAN: Patient to be seen 2x weekly as possible. In group/1:1 sessions to focus on: Appropriate socialization skills, self expression, and attention.     Therapist: Chiquita Emperor, CTRS   Date: 10/30/2023

## 2023-10-30 NOTE — Progress Notes
 PHYSICAL THERAPY  NOTE      Name: Kevin Obrien   MRN: [id number]     DOB: [birth year]      Age: 73 y.o.  Admission Date: 10/28/2023     LOS: 2 days     Date of Service: 10/30/2023         10/30/23 1400   Type of Note   Type of Note Daily   Time Calculation   Start Time 1400   Stop Time 1500   Time Calculation 60   Subjective   Subjective Patient in bed upon arrival, reports dizziness when lying down. Reports it started a few days ago but today is the worst that its been. Unchanged during orthostatic vitals assessment. Initally agreeable to perform oral cares but then declined. Initially declining timed toileting but eventually agreeable when given choices of intervention and education. Back in bed at end of session, RN notified.   Vitals*   Activity At Rest   Pulse 76   BP Patient Position Orthostatic - Supine   BP (!) 142/58   O2 Device None (Room air)   Pain   Pain Scale No Pain   Admission Health Conditions   Pain Effect On Sleep 1   Pain Interference with Therapy Activities 1   Pain Interference with Day-to-Day Activities 1   Precautions   Comments L sided weakness, visual impairment (horizontal gaze palsy), PEG   Balance   Sitting Balance Static Sitting Balance;Moderate Assist;Dynamic Sitting Balance;Dependent Assist  (leans to right)   Standing Balance Static Standing Balance;Moderate Assist;Dynamic Standing Balance;Dependent Assist   Rolling Left and Right   Admission: Roll Left and Right  03   Lying to Sitting on Side of Bed   Patient Needs Assistance With Trunk;Extra time to complete the activity   Assist Level Partial/moderate assistance   Admission: Lying to Sitting on Side of Bed  01   Equipment With rails;HOB elevated   Sit to Lying   Patient Needs Assistance With Bilateral lower extremities;Extra time to complete the activity;Trunk   Assist Level Dependent   Admission: Sit to Lying 01   Equipment No rails;HOB flat   Sit to and from Stand   Sit to and from Stand Sit to stand;Stand to sit   Sit to Stand Patient Needs Assistance With Extra time to complete activity;Financial planner;Facilitation of hip flexion;Facilitation of trunk;Facilitation of weight shift;Cues for positioning of feet;Cues for positioning of hands;Cues for sequencing;Balance;Assist of two   Assist Level Dependent   Admission: Sit to Stand 01   Equipment Hand hold assist   Stand to Sit   Patient Needs Assistance With Extra time to complete activity;Financial planner;Facilitation of trunk;Facilitation of weight shift;Cues for positioning of feet;Cues for positioning of hands;Cues for sequencing;Balance;Assist of two   Assist Level Dependent   Equipment Hand hold assist   Chair/Bed-to-Chair Transfer   Admission: Chair/Bed-to-Chair Transfer  01   Stand Pivot Transfer   Patient Needs Assistance With Extra time to complete activity;Financial planner;Facilitation of hip flexion;Facilitation of trunk;Facilitation of weight shift;Cues for positioning of feet;Cues for positioning of hands;Cues for sequencing;Balance;Assist of two   Assist Level Dependent   Equipment Hand hold assist   Comments bed<>wheelchair<>toilet   Car Transfer   Admission: Car Transfer 01   Comments per clinical judgement   Toileting   Patient Needs Assistance With Adjusting clothing before toileting;Wiping self;Adjusting clothing after toileting;Balance   Assist Level Dependent   Position Standing for pants management;Standing for wiping   Equipment Grab bar  Statistician   Patient Product/process development scientist;Facilitation of hip flexion;Facilitation of trunk;Facilitation of weight shift;Increased time to complete;Balance;Verbal cues;Transferring on;Transferring off;Assist of two   Transfer Type Stand pivot   Assist Level Dependent   Equipment Grab bar   Daily Care   Bladder Management Comments timed void, no output   Patient continent of bowel? No   Device Absorbent pad   Stool Occurrence 1   Accident  - Bowel Small   Stool Amount Smear   Stool Appearance Pasty;Soft   Stool Color Delores   Last Bowel Movement Date 10/30/23   Gait   Admission: Walk 10 Feet 01    Admission: Walk 50 feet with two turns 01    Admission: Walk 150 feet 01    Admission: Walking 10 feet on uneven surfaces 01   Stairs   Admission: 1 Step (Curb) 01    Admission: 4 Steps 01    Admission: 12 Steps 01   Wheelchair Mobility   Admission: Does the Patient Use a Wheelchair/Scooter? 0   Picking Up Object from Standing   Admission: Picking Up Object 01   Comments per clinical judgement   Therapeutic Interventions   Therapeutic Activities orthostatic vitals assessment, toileting   Assessment   Assessment Patient requiring Ax2 for transfers and declining to trial ambulation today. Required mod assist for static sitting and Ax2 for static standing due to forward flexed posture and leading to right. Continues to require increased time and encouragement to participate. Going to trial shorter sessions tomorrow, as activity tolerance is low.   Plan   PT Plan trial gait, standing/reaching activities, trial stairs as appropriate, nustep for conditioning, outcome measures as appropriate   Recommendations   PT Discharge Recommendations Consistent supervision;Home setting with home health   PT Equipment Recommendations Too early to determine       Therapist: Tereasa Free, PT, DPT  Date: 10/30/2023

## 2023-10-30 NOTE — Progress Notes
 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  male admitted to [facility] on 10/28/2023 with the following issues: stroke      Rehabilitation Plan  Tentative discharge date: 11/19/2023  Rehabilitation: Patient will continue with comprehensive therapies including physical therapy, occupational therapy, speech & language pathology, specialized rehab nursing, neuropsychology and physiatry oversight.    Goals:  Patient Will Perform Basic Cares With: Partial/moderate assistance, Caregiver assist of one  Patient Will Perform Household Mobility With: Partial/moderate assistance, At wheelchair level  Recommended therapy after discharge: Consistent supervision, Home setting with home health  PT recommended equipment: Too early to determine  OT recommended equipment: Tub transfer bench    Daily Functional Update:  Stand to Sit Assist Level: Dependent (10/29/2023  1:00 PM)  Equipment: Hand hold assist; Grab bar; Roller walker (10/29/2023  1:00 PM)     Stand Pivot Assist Level: Dependent (10/29/2023  1:00 PM)  Equipment: Hand hold assist; Grab bar (10/29/2023  1:00 PM)     Slideboard No data recorded  No data recorded   Gait No data recorded  No data recorded  No data recorded   Wheelchair No data recorded  No data recorded   Toileting       Assist Level: Dependent (10/29/2023  1:00 PM)  Equipment: Grab bar (10/29/2023  1:00 PM)     Toilet Transfer No data recorded   Upper Body Dressing Assist Level: Substantial/maximal assistance (10/29/2023  1:00 PM)     Lower Body Dressing Assist Level: Substantial/maximal assistance (10/29/2023  1:00 PM)       Diet: PO: Ice chips only (10/29/2023  9:15 AM)      No data found.        Current Medical Problems/Risks of Medical Complications/Management    Acute Right Pontomedullary Ischemic Stroke  Proximal Right Intracranial Vertebral Artery Occlusion  Diffuse Intracranial Atherosclerosis  Bilateral Horizontal Gaze Palsy  History of Forced Left Gaze Deviation  Left Facial Palsy  Impairments: left hemiparesis, expressive aphasia, oropharyngeal dysphagia, right facial weakness, left facial droop with dysarthria, diplopia, concern for attention/cognitive impairments, LUE ataxia, neurogenic bowel, neurogenic bladder  Impaired mobility and ADL, impaired swallow and cognition/communication               >  consult PT/OT/SLP and neuropsychology to evaluate and treat               > consider discussion with patient/family regarding the use of modafinil if daytime fatigue becomes an ongoing issue               > Sleep optimization: melatonin 5mg  qhs, previously on seroquel nightly               > follow up: Neurology, PM&R, Cardiology     Increased Somnolence - Resolved  - Concern for increased somnolence 5/20 leading to Neurology Consult- rec CTA H&N; discussed with team may attempt to liberalize BP I.e. decreasing amlodipine to 5mg  due to symptoms possibly 2/2 to hypoperfusion  - CTA Head and Neck - negative for acute change  - Infectious workup including UA, CXR- negative for acute concerns  > appreciate further neurology recommendations- if acute focal weakness, numbness, coma, etc. Stroke activate/send to ED as he is high risk to have progression of his stroke/basilar involvement.      Secondary Stroke Risk Optimization  > Antiplatelet/Anticoagulation: DAPT with aspirin 81mg  daily and clopidogrel 75mg  daily for 90 days followed by aspirin 81mg  indefinitely  > Blood Pressure Goal: <160 during acute hospitalization, long term goal of < 130/80               - Current antihypertensive medications: amlodipine 5mg  daily, lisinopril 20mg  daily  > Lipids: LDL is 161, with goal of <70: rosuvastatin 40mg  daily (per Cardiology recs)  > Blood Glucose: Hgb A1c is 11.7%, with goal of <7  > Tobacco abstinence goal     At risk for Spasticity               > consider trial of baclofen if indicated, discuss potential benefits and side effects               > daily ROM with therapy      Dysphagia s/p PEG tube placement  Moderate Malnutrition  - PEG tube placement 5/15               > Dietician consult, appreciate tube feed recommendations- transition from continuous to bolus feeds 5/19               Recommend Jevity 1.5 bolus feeds, 5 cartons/day + 1 prosource pkt.     Recommend 1/2 carton per bolus for first 1-2 feeds, then proceed to goal regimen.    This will provide 1857kcal, 96g protein, free H2O.    Recommend 30mL free H2O flush before and after each bolus + 100mL q4h to meet fluid needs.   To help with loose stools, before interrupting EN support:    Avoid sorbitol containing medications such as liquid tylenol as these can make diarrhea worse; recommend switching to crushable tablet form.    Consider probiotic.    If loose stools persist, recommend adding 1-2 packets of nutrisource fiber per day.      Aspiration Pneumonia  S/p AHRF  - increased O2 req overnight 5/13 with elevated procal, tachycardia, leukocytosis  - CXR 5/13 notable for mild basal opacities, favored to represent atelectasis  - s/p vanc x1 and zosyn x3 days  - sputum cx NGTD               > Augmentin to complete total 7 day course of antibiotics through 5/20 AM     Left Bell's Palsy               >  Alternate eye patches; eyepatch for the L eye overnight, artificial tears TID daily in the setting of left facial palsy     Pseudobulbar Affect  > Per Neurology: Consider starting Dextromethorphan-Quinidine in the outpatient setting (unfortunately unavailble on the formulary inpatient) for Pseudobulbar affect, will start Fluoxetine 20mg  in the meantime --> instead was started on Sertraline 50mg , will continue while in rehab  > Neurology clinic follow up on dc     Oral Thrush               > oral care TID   > Nystatin swish and spit started 5/20, ok'd by SLP     Bowel Management  At risk for Neurogenic Bowel  Loose Stools  -Last Bowel Movement Date: 10/30/23 (10/30/2023  8:11 AM)  - Goal for a daily BM or every 24-48 hours.   - c diff negative 5/18  > adjust bowel regimen PRN   > loperamide PRN     Bladder Management  Neurogenic Bladder with Retention       -Post-Void (PVR) Bladder Scan (mL): 451 milliliters (10/29/2023  6:13 PM)  -Straight Cath (mL): 300 (10/30/2023  2:45 AM)  > Monitor urinary retention per unit protocol. If unable to void or PVRs >300cc perform clean ISC (intermittent straight catheterization) for volumes > . Stop checking BVIs if 3 consecutive volumes <150cc.   > Consider foley placement  >  add flomax 0.4mg  QHS starting 5/21     Hypertension  Orthostatic Hypotension  LVOT Obstruction  PFO  CAD s/p PCI               > metoprolol 25mg  BID               > amlodipine 10mg  daily decreased to 5mg  5/20, lisinopril 20mg  daily               > 30 day MCOT    > compression stockings to prevent sx orthostatic hypotension               > Follow up with outpatient Cardiology      Diabetes Mellitus               > Endocrinology consult for ongoing discharge regimen planning     Depression/Mood screen :          > neuropsychology to evaluate and treat for adjustment to new deficits          > Encouragement and support    Nutrition:   Body mass index is 21.9 kg/m?SABRA  Malnutrition Details:  Malnutrition present on admission  ICD-10 code E44: Chronic illness/Moderate non-severe malnutrition      Mild loss of muscle mass, Mild loss of body fat, Energy intake: Less than 75% of estimated energy requirement for 1 month or more        Loss of Subcutaneous Fat: Yes Mild Orbital, Triceps  Muscle Wasting: Yes Moderate Clavicle, Temple, Deltoid               Malnutrition Interventions: EN recs to meet 100% estimated kcal and protein needs    Wounds:        DVT ppx: enoxaparin (LOVENOX) syringe 40 mg  QDAY(21); patient will not require at the time of discharge    Code Status: Full Code    Current Medications:  Scheduled Meds:amLODIPine (NORVASC) tablet 5 mg, 5 mg, Per NG tube, QDAY  artificial tears (PF) single dose ophthalmic solution 1 drop, 1 drop, Left Eye,  TID  aspirin chewable tablet 81 mg, 81 mg, Per NG tube, QDAY  clopiDOGreL (PLAVIX) tablet 75 mg, 75 mg, Per NG tube, QDAY  Diet Enteral Feeding Bolus, , SEE ADMIN INSTRUCTIONS, QID (3,87,83,79)   And  Water Bolus, 100 mL, PEG Tube, QID (3,87,83,79)  enoxaparin (LOVENOX) syringe 40 mg, 40 mg, Subcutaneous, QDAY(21)  insulin aspart (U-100) (NOVOLOG FLEXPEN U-100 INSULIN) injection PEN 0-12 Units, 0-12 Units, Subcutaneous, QID  insulin aspart (U-100) (NOVOLOG FLEXPEN U-100 INSULIN) injection PEN 14 Units, 14 Units, Subcutaneous, QID  insulin glargine (LANTUS SOLOSTAR U-100 INSULIN) injection PEN 18 Units, 18 Units, Subcutaneous, QDAY  lisinopriL (ZESTRIL) tablet 20 mg, 20 mg, Feeding Tube, QDAY  melatonin tablet 5 mg, 5 mg, Oral, QHS  metoprolol tartrate tablet 25 mg, 25 mg, Feeding Tube, BID  nystatin (MYCOSTATIN) oral suspension 500,000 Units, 500,000 Units, Swish & Spit, QID  petrolatum (STYE) ophthalmic ointment 0.25 inch, 0.25 inch, Both Eyes, TID  rosuvastatin (CRESTOR) tablet 40 mg, 40 mg, Feeding Tube, QDAY  sertraline (ZOLOFT) tablet 50 mg, 50 mg, Feeding Tube, QDAY  thiamine mononitrate (vit B1) tablet 100 mg, 100 mg, Per NG tube, QDAY    Continuous Infusions:  PRN and Respiratory Meds:acetaminophen Q8H PRN, bisacodyL QDAY PRN, dextrose 50% (D50) IV PRN, diclofenac sodium QID PRN, lidocaine hcl PRN, loperamide TID PRN, pancrelipase 20,880 Units/sodium bicarbonate 650 mg ([place] CLOG DESTROYER) PRN (On Call from Rx), phenoL PRN      Subjective     Seen at bedside this AM. No acute overnight events. Pt alert and interactive. PT at bedside reports pt was having some dizziness with sitting up, vitals notable for BP 140/59 lying down and 119/55 after sitting up. Pt reports vision is overall improving. Pt denies significant pain. Discussed trying flomax to help with urinary retention which pt was agreeable to. Last bowel movement 5/21. No further acute concerns per pt at this time.     Objective                          Vital Signs: Last Filed                 Vital Signs: 24 Hour Range   BP: 119/55 (05/21 0845)  Temp: 36.2 ?C (97.2 ?F) (05/21 0429)  Pulse: 67 (05/21 0845)  Respirations: 16 PER MINUTE (05/21 0429)  SpO2: 97 % (05/21 0845)  O2 Device: None (Room air) (05/21 0845) BP: (111-152)/(47-73)   Temp:  [36.2 ?C (97.2 ?F)-37.4 ?C (99.3 ?F)]   Pulse:  [67-80]   Respirations:  [16 PER MINUTE-18 PER MINUTE]   SpO2:  [96 %-100 %]   O2 Device: None (Room air)     Vitals:    10/28/23 1324   Weight: 59.7 kg (131 lb 9.8 oz)       Intake/Output Summary:  (Last 24 hours)    Intake/Output Summary (Last 24 hours) at 10/30/2023 1236  Last data filed at 10/30/2023 1227  Gross per 24 hour   Intake 1848 ml   Output 300 ml   Net 1548 ml        Last Bowel Movement Date: 10/30/23    Labs--reviewed  Results for orders placed or performed during the hospital encounter of 10/28/23 (from the past 24 hours)   POC GLUCOSE Collection Time: 10/29/23  4:20 PM   # # Low-High    Glucose, POC 253 (H) 70 - 100 mg/dL   URINALYSIS DIPSTICK REFLEX TO CULTURE  Collection Time: 10/29/23  6:27 PM    Specimen: Catheter, In and Out; Urine   # # Low-High    Color,UA Yellow     Turbidity,UA 1+ (A) Clear    Specific Gravity-Urine 1.038 (H) 1.005 - 1.030    pH,UA 6.0 5.0 - 8.0    Protein,UA 1+ (A) Negative    Glucose,UA 3+ (A) Negative    Ketones,UA Negative Negative    Bilirubin,UA Negative Negative    Blood,UA Negative Negative    Urobilinogen,UA Normal Normal    Nitrite,UA Negative Negative    Leukocytes,UA Negative Negative   URINALYSIS MICROSCOPIC REFLEX TO CULTURE    Collection Time: 10/29/23  6:27 PM    Specimen: Catheter, In and Out; Urine   # # Low-High    WBCs,UA 0 - 2 None, 0 - 2  /HPF    RBCs,UA 0 - 2 None, 0 - 2  /HPF    Mucous,UA Trace None, Trace /LPF   POC GLUCOSE    Collection Time: 10/29/23  8:49 PM   # # Low-High    Glucose, POC 294 (H) 70 - 100 mg/dL   POC GLUCOSE    Collection Time: 10/30/23  5:25 AM   # # Low-High    Glucose, POC 136 (H) 70 - 100 mg/dL   CBC CELLULAR THERAPEUTICS    Collection Time: 10/30/23  6:27 AM   # # Sharleen    White Blood Cells 7.50 4.50 - 11.00 10*3/uL    Red Blood Cells 4.19 (L) 4.40 - 5.50 10*6/uL    Hemoglobin 12.5 (L) 13.5 - 16.5 g/dL    Hematocrit 64.1 (L) 40.0 - 50.0 %    MCV 85.3 80.0 - 100.0 fL    MCH 29.7 26.0 - 34.0 pg    MCHC 34.8 32.0 - 36.0 g/dL    RDW 87.3 88.9 - 84.9 %    Platelet Count 302 150 - 400 10*3/uL    MPV 8.6 7.0 - 11.0 fL   BASIC METABOLIC PANEL CELLULAR THERAPEUTICS    Collection Time: 10/30/23  6:27 AM   # # Low-High    Sodium 140 137 - 147 mmol/L    Potassium 4.0 3.5 - 5.1 mmol/L    Chloride 105 98 - 110 mmol/L    Glucose 175 (H) 70 - 100 mg/dL    Blood Urea Nitrogen 17 7 - 25 mg/dL    Creatinine 9.21 9.59 - 1.24 mg/dL    Calcium 9.0 8.5 - 89.3 mg/dL    CO2 25 21 - 30 mmol/L    Anion Gap 10 3 - 12    Glomerular Filtration Rate (GFR) >60 >60 mL/min   POC GLUCOSE    Collection Time: 10/30/23 11:23 AM   # # Low-High    Glucose, POC 223 (H) 70 - 100 mg/dL         Physical Exam  General: no distress lying in bed  HEENT: oral mucosa moist   Heart: Extremities warm and well perfused  Lungs: Normal work of breathing on room air  Abdomen: Soft, non-distended  Extremities: Peripheral edema is not present  Psych: mood and affect seemingly congruent  Neuro: Alert, interactive, responds to simple questions however hypophonic and dysarthric. Left facial paralysis, bilateral horizontal gaze palsy appreciated. MMT notable for 4/5 of the LUE throughout, 5/5 RUE strength throughout, 5/5 bilateral ankle dorsiflexion, at least antigravity bilateral hip flexion.       Labs & Therapy Notes Reviewed    Plan discussed with attending,  Dr. Laurance Corean CHRISTELLA Andria, DO   Physical Medicine & Rehabilitation  For patient care questions contact Rehab Medicine 3 FIRST CALL via Voalte.

## 2023-10-30 NOTE — Rehab Care Plan
 Physical Medicine & Rehabilitation Individualized Overall Plan of Care    Date of Service:  10/30/2023   Name: Kevin Obrien   MRN: [id number]     DOB: [birth year]      Age: 73 y.o.  Admission Date: 10/28/2023     LOS: 2 days     Date of Service: 10/30/2023      Insurance:  Medicare Repl  Date of Admission:  10/28/2023    Hospital Course:   Kevin Obrien is a 73 y.o. male and  has a past medical history of Accidental fall, Angina pectoris, Arthritis, BPH (benign prostatic hyperplasia), Cataract, Constipation, Coronary artery disease, DM (diabetes mellitus) (CMS-HCC), ED (erectile dysfunction), Heart attack (CMS-HCC), Hyperlipidemia, Hypertension, Joint pain, Neuropathy, and Shingles. The patient presented to [facility] on [facility]17/2023 with left gaze deviation, facial weakness and dysarthria.  CT head was negative for acute changes.  CTA head and neck without LVO.  Patient was not a TNK or EVT candidate. 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 Neurology recommend DAPT with aspirin and Plavix.  He is currently on permissive hypertension. The patient's hospital course is functionally complicated by impaired mobility and ADLs.       Patient has been working with physical and occupational therapy since 10-18-2023 and has been making functional gains. Patient is anticipated to return to home at the supervision level of care. Patient has also been working with speech language pathology on dysphagia; however will also receive formal cognitive evaluation to assess for baseline status and any new cognitive/linguistic deficits.       Rehab Diagnosis:   stroke   Cognitive impairment, Dysphagia, Pain, and Weakness      Medical Course since IRF Admission:    The patient?s clinical course since admission has been complicated.  The patient has not been able to participate fully in the rehabilitation program due to acute neurological change on 5/20 necessitating missed therapy minutes and medical workup including stat imaging, labs and further infectious workup.    Medical Prognosis: Fair    The patient has a reasonable likelihood of fully participating in and completing the IRF stay based on ability to manage medical issues including neurorecovery, monitoring for infections off of antibiotics, diet upgrade as able, BP management, and neurogenic bladder management.  There is a reasonable expectation of several years of productive life in a community setting in which they will benefit from this stay.     This patient meets medical necessity requiring the intensity of therapy available at the [facility]. The patient can participate in and can fully benefit from the services offered in the IRF setting including 24 hour rehabilitation nursing, daily oversight from the Physiatrist, and complex interdisciplinary rehab as noted below.    Quality Indicators:  Current Level of Function      Evaluation QI Current QI   Sit to Stand Assist Level: Dependent Assist Level: Dependent   Gait       Gait       Gait       Wheelchair       Wheelchair       Stairs       Eating Assist Level: Not attempted due to medical condition or safety concerns Assist Level: Not attempted due to medical condition or safety concerns   Grooming       Bathing Assist Level: Substantial/maximal assistance Assist Level: Substantial/maximal assistance   Upper Body Dressing Assist Level: Substantial/maximal assistance Assist Level:  Substantial/maximal assistance   Lower Body Dressing Assist Level: Substantial/maximal assistance Assist Level: Substantial/maximal assistance   Toileting Assist Level: Dependent Assist Level: Dependent   Toilet Transfer    Assist Level: Dependent    Assist Level: Dependent   Tub/Shower Transfer Assist Level: Dependent Assist Level: Dependent    Patient continent of bladder?: Yes, but no urine output Patient continent of bladder?: Yes, but no urine output    Patient continent of bowel?: No Patient continent of bowel?: No    PO: Ice chips only PO: Ice chips only                                                       PT recommended equipment: Too early to determine   OT recommended equipment: Tub transfer bench                   Physical Therapy Goals  Patient Will Perform Basic Cares With: Partial/moderate assistance, Caregiver assist of one  Patient Will Perform Household Mobility With: Partial/moderate assistance, At wheelchair level  Occupational Therapy Goals  Patient Will Perform Basic Cares With: Partial/moderate assistance, Caregiver assist of one    Speech Therapy Goals  Patient Will Exhibit a Safe Swallow for the Least Restrictive Consistency with use of Swallow Strategies Provided with:  (Supervision)                                                                                              Nursing Goals  TBD    Additional therapeutic disciplines may be included during this stay if indicated during interdisciplinary communication and will be noted in the daily progress notes when relevant.  Social Work will address discharge planning needs.    Rehabilitation Plan    Patient will receive Physical therapy, Occupational therapy, and Speech therapy each 60 minutes a day, 5 days a week for a total of 3 hours daily (minimum) for the duration of the rehabilitation stay within an interdisciplinary rehabilitation program with case manager/social worker, dietician, neuropsychologist, rehab nursing and PM&R oversight.    Rehabilitation Prognosis: Good, anticipate steady functional gains with PT, OT, and SLP to return home with family support.  Tolerance for three hours of therapy a day: Good, patient is tolerating 3 hours of therapy per day, as required.    Goals/Barriers/Facilitators  Family / Patient Goals: Patient would like to regain strength and endurance to safely return home.   Mobility Goals: Updated per therapy evaluation as above  Activities of Daily Living (ADLs) Goals: Updated per therapy evaluation as above.  Cognition / Communication Goals: Updated per therapy evaluation as above.    Discharge Planning  Expected Length of Stay 3 week(s)  Expected Discharge Disposition SNF  Expected Discharge Needs PT, OT, and SLP    The patient should reach their current goals by noted projected discharge date.    This plan of care was formulated based upon a review of the progress this patient  made with therapies during the acute hospital stay, the goals set by the inpatient rehabilitation therapists at the time of their initial assessment, and my expertise in caring for patients with this rehabilitation diagnosis and accompanying comorbidities.    Ahliyah Nienow Z Cherly Erno, DO  10/30/23 11:35 AM

## 2023-10-31 LAB — POC GLUCOSE
~~LOC~~ BKR POC GLUCOSE: 309 mg/dL — ABNORMAL HIGH (ref 70–100)
~~LOC~~ BKR POC GLUCOSE: 149 mg/dL — ABNORMAL HIGH (ref 70–100)
~~LOC~~ BKR POC GLUCOSE: 164 mg/dL — ABNORMAL HIGH (ref 70–100)
~~LOC~~ BKR POC GLUCOSE: 172 mg/dL — ABNORMAL HIGH (ref 70–100)
~~LOC~~ BKR POC GLUCOSE: 265 mg/dL — ABNORMAL HIGH (ref 70–100)

## 2023-10-31 LAB — BASIC METABOLIC PANEL CELLULAR THERAPEUTICS
~~LOC~~ BKR ANION GAP: 10 % — ABNORMAL LOW (ref 3–12)
~~LOC~~ BKR BLD UREA NITROGEN: 15 mg/dL — ABNORMAL LOW (ref 7–25)
~~LOC~~ BKR CALCIUM: 9.1 mg/dL — ABNORMAL HIGH (ref 8.5–10.6)
~~LOC~~ BKR CHLORIDE: 103 mmol/L — ABNORMAL HIGH (ref 98–110)
~~LOC~~ BKR CO2: 26 mmol/L — ABNORMAL LOW (ref 21–30)
~~LOC~~ BKR CREATININE: 0.7 mg/dL — ABNORMAL HIGH (ref 0.40–1.24)
~~LOC~~ BKR GLOMERULAR FILTRATION RATE (GFR): >60 mL/min (ref >60–7.00)
~~LOC~~ BKR POTASSIUM: 4 mmol/L — ABNORMAL LOW (ref 3.5–5.1)
~~LOC~~ BKR SODIUM, SERUM: 139 mmol/L — ABNORMAL LOW (ref 137–147)

## 2023-10-31 LAB — CBC CELLULAR THERAPEUTICS
~~LOC~~ BKR MCH: 30 pg — ABNORMAL HIGH (ref 26.0–34.0)
~~LOC~~ BKR RBC COUNT: 4.1 10*6/uL — ABNORMAL LOW (ref 4.40–5.50)
~~LOC~~ BKR WBC COUNT: 7.9 10*3/uL — ABNORMAL HIGH (ref 4.50–11.00)

## 2023-10-31 MED ORDER — MELATONIN 5 MG PO TAB
5 mg | Freq: Every evening | GASTROSTOMY | 0 refills | Status: DC
Start: 2023-10-31 — End: 2023-11-22
  Administered 2023-11-01 – 2023-11-22 (×22): 5 mg via GASTROSTOMY

## 2023-10-31 NOTE — Progress Notes
 OCCUPATIONAL THERAPY  NOTE   Name: Kevin Obrien   MRN: [id number]     DOB: 08-06-1950      Age: 73 y.o.  Admission Date: 10/28/2023     LOS: 3 days     Date of Service: 10/31/2023         10/31/23 1300   Type of Note   Type of Note Daily   Time Calculation   Start Time 1300   Stop Time 1330   Time Calculation 30   $$ Phys ADL Skills 1 unit   $$ Therapeutic Activity 1 unit - 15 min   Subjective   Subjective Pt seated in bed on arrival and agreeable to OT. Pt seated in wheelchair on exit with alarm on and needs met/in reach   Precautions   Comments L sided weakness, visual impairment (horizontal gaze palsy), PEG   Toileting   Patient Needs Assistance With Adjusting clothing before toileting;Wiping self;Adjusting clothing after toileting;Balance   Assist Level Dependent   Position Standing for pants management;Standing for wiping   Comments Ax2   Toilet Transfer   Patient Needs Assistance With Facilitation of trunk;Facilitation of weight shift;Increased time to complete;Balance;Verbal cues;Transferring on;Transferring off;Assist of two;Blocking of LLE;Blocking of RLE;Facilitation of hip extension   Transfer Type Non powered stand aid (i.e. Camie Ip)   Assist Level Dependent   Equipment Non powered stand aid Jayson Ip)   Comments Ax2   Daily Care   Bladder Management Comments attempted void, no output   Sit to and from Stand   Sit to and from Stand Sit to stand;Stand to sit   Sit to Stand   Patient Needs Assistance With Trunk;Extra time to complete activity;Blocking of left lower extremity;Blocking of right lower extremity;Facilitation of hip extension;Facilitation of trunk;Facilitation of weight shift;Cues for positioning of hands;Cues for sequencing;Balance;Assist of two   Assist Level Dependent   Equipment Non powered stand aid Jayson Ip)   Stand to Sit   Patient Needs Assistance With Trunk;Facilitation of weight shift;Balance;Assist of two   Assist Level Dependent   Equipment Non powered stand aid Jayson Ip)   Chair/Bed-to-Chair Transfer   Chair/Bed-to-Chair Transfer Dependent lift   Dependent Lift Transfer   Patient Needs Assistance With Trunk;Extra time to complete activity;Blocking of left lower extremity;Blocking of right lower extremity;Facilitation of hip extension;Facilitation of trunk;Facilitation of weight shift;Cues for positioning of hands;Assist of two   Assist Level Dependent   Equipment Non powered stand aid Jayson Ip)   Comments bed>toilet>wheelchair   Therapeutic Interventions   Functional Mobility Therapist wheeled patient 1x lap around the unit for therapeutic activity and orient patient to unit   Assessment   Assessment Patient demo's slight increase in sitting balance and trunk control while seated on toilet and in wheelchair. Continue progressing towards goals   Plan   OT Plan LUE NMR, vision assessment (5/23), dynamic sitting balance, progress transfers, standing balance, standing for ADLs as able, ADLs   Recommendations   OT Discharge Recommendations Consistent supervision;Home setting with home health;Versus;Skilled nursing facility   OT Equipment Recommendations Tub transfer bench         Therapist: Phyliss Quale, OTD, OTR/L  Date: 10/31/2023

## 2023-10-31 NOTE — Consults
 Chaplain Note:    Consult, patient is Catholic and requested for Spiritual Needs.  Chaplain visited to offer prayer, support and sacramental care.   Patient appeared calm and hopeful but concerned about his health and asked for prayer and sacramental care for healing.  Patient shared about his concerns.  Chaplain offered active listening, support, prayer and provided the Sacrament of the Sick / Last Rites for peace, hope and healing.  Patient appreciated for visit.  Continue available for support and prayer.     The spiritual care team is available as needed, 24/7, through the campus switchboard [phone]). For a response within 24 hours, please submit an order in [facility] for a chaplain consult.

## 2023-10-31 NOTE — Progress Notes
 PHYSICAL THERAPY  NOTE      Name: Kevin Obrien   MRN: [id number]     DOB: September 05, 1950      Age: 73 y.o.  Admission Date: 10/28/2023     LOS: 3 days     Date of Service: 10/31/2023         10/31/23 1330   Type of Note   Type of Note Daily   Time Calculation   Start Time 1400   Stop Time 1430   Time Calculation 30   $$ Gait / Mobility 2 units    Subjective   Subjective Patient sitting up in wheelchair before and after session. Agreeable to PT with encouragement.   Precautions   Comments L sided weakness, visual impairment (horizontal gaze palsy), PEG   Sit to and from Stand   Sit to and from Stand Sit to stand;Stand to sit   Sit to Stand   Patient Needs Assistance With Trunk;Facilitation of hip extension;Facilitation of trunk;Facilitation of weight shift;Cues for sequencing;Extra time to complete activity;Cues for positioning of hands;Balance   Assist Level Substantial/maximal assistance   Equipment Roller walker   Stand to Sit   Patient Needs Assistance With Trunk;Facilitation of weight shift;Balance   Assist Level Substantial/maximal assistance   Optometrist Distance 1   Gait Distance 1   Distance 5 + 10 + 20   Patient Needs Assistance With Facilitation of trunk;Cues for positioning of hands;Cues for step length;Balance   Assist Level Dependent   Equipment Roller walker;Wheelchair follow   Patterns Decreased gait velocity;Narrow base of support;Shuffling;Trunk lean forward   Deviation Left Decreased hip extension;Decreased knee flexion in swing;Decreased stride length;No heel strike/foot flat   Deviation Right Decreased hip extension;Decreased knee flexion in swing;Decreased stride length;No heel strike/foot flat   Activity Limited By Complaint of fatigue;Weakness   Comments max cues for eyes up to achieve upright posture with minimal follow through   Assessment   Assessment Able to trial ambulation with a RW this aftenroon with Ax2 for safety. Patient demo very forward flexed posture. Denies dizziness or visual disturbances when attempting to look down hallway and declines wearing eye patch. Patient requiring cues to terminate ambulation once requiring max A from PT to avoid falling.   Plan   PT Plan gait training (RW with wheelchair follow vs consider vector to help with posture), neurogym (consider using mirror for visual feedback), standing/reaching activities, trial stairs as appropriate, nustep for conditioning, outcome measures as appropriate   Recommendations   PT Discharge Recommendations Consistent supervision;Home setting with home health;Versus;Skilled nursing facility   PT Equipment Recommendations Too early to determine  (likely RW and wheelchair (lightweight vs custom ultralight))         Therapist: Tereasa Free, PT, DPT  Date: 10/31/2023

## 2023-10-31 NOTE — Progress Notes
 RT Adult Assessment Note    NAME:Kevin Obrien             MRN: [id number]             DOB:03/04/1951          AGE: 73 y.o.  ADMISSION DATE: 10/28/2023             DAYS ADMITTED: LOS: 3 days    Additional Comments:  Impressions of the patient: resting comfortably on RA  Intervention(s)/outcome(s): nts if needed  Patient education that was completed: none  Recommendations to the care team: none    Vital Signs:  Pulse: 83  RR: 16 PER MINUTE  SpO2: 93 %  O2 Device: None (Room air)  Liter Flow:    O2%:      Breath Sounds:   All Breath Sounds: Clear (Implies normal);Decreased  Respiratory Effort:   Respiratory Effort/Pattern: Unlabored  Comments:

## 2023-10-31 NOTE — Progress Notes
 PHYSICAL THERAPY     10/31/23 0815   Type of Note   Type of Note Daily   Time Calculation   Start Time 0815   Stop Time 0845   Time Calculation 30   History   Reason For Admission The patient presented to [facility] on 10/17/2023 with left gaze deviation, facial weakness and dysarthria.  CT head was negative for acute changes.  CTA head and neck without LVO.  Patient was not a TNK or EVT candidate. 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 Neurology recommend DAPT with aspirin and Plavix.  He is currently on permissive hypertension.   Subjective   Subjective Patient sleeping in bed on PT arrival. Wakes briefly to therapist's voice. Agreeable to session with head nod.   Pain   Pain Scale 0-10 Scale  (Does not rate)   Rolling Left and Right   Patient Needs Assistance With Trunk;Bilateral lower extremities   Assist Level Partial/moderate assistance   Equipment With rails   Lying to Sitting on Side of Bed   Patient Needs Assistance With Trunk;Extra time to complete the activity   Assist Level Partial/moderate assistance   Equipment With rails;HOB elevated   Comments Requires repeated cues to maintain alertness and participate   Sit to Stand   Patient Needs Assistance With Trunk;Facilitation of hip extension;Facilitation of trunk;Facilitation of weight shift;Cues for sequencing;Extra time to complete activity   Assist Level Substantial/maximal assistance   Equipment Non powered stand aid Jayson Ip)   Comments Stands from elevated bed to sara stedy with max assist x1. Once in sara stedy with flaps down, patient slumps forward to rest face and mouth on bar of sara stedy. Pillow placed over bar to protect patient. Encouraged patient to maintain upright posture in order to safely complete transfer to chair. Patient unable or unwilling to assist.   Stand to Sit   Patient Needs Assistance With Trunk;Facilitation of weight shift;Balance   Assist Level Dependent   Equipment Non powered stand aid Jayson Ip)   Stand Pivot Transfer   Assist Level Dependent   Equipment Non powered stand aid Jayson Ip)   Therapeutic Interventions   Therapeutic Activities Bed mobility, transfer to wheelchair via sara stedy   Assessment   Assessment Patient continues to require significant assist for all mobility. Unclear if patient is unable or unwilling to participate. Will continue to progress as able.   Plan   PT Plan Trial neurogym for sit to stands and find midline in standing, upright posture, transfers   Recommendations   PT Discharge Recommendations Consistent supervision;Home setting with home health   PT Equipment Recommendations Too early to determine     Geni Jacobsen, PT, DPT [id number]

## 2023-10-31 NOTE — Progress Notes
 SPEECH-LANGUAGE PATHOLOGY         10/31/23 0845   Behavior   Comments* Pt seated upright in wheelchair and had just finished with PT session. Pt alert and cooperative. Pt returned to bed following session with bed alarm set and call light within reach.   Dysphagia Goals   Therapy Activities PO trials;Swallow exercises   Therapy Activity Comments Pt provided with PO trials ice chips, thin liquids via cup/straw, mildly thick liquids via cup/straw, and pureed solids. Intermittent L-sided anterior loss however pt demonstrated sensory awareness this date. All cup trials provided via 5cc controlled volumes in medicine cup in attempt to improve coordination/control. Ongoing suspicion for premature spillage and delayed swallow however cannot definitively determine. Intermittent cues for swallow required in which pt demonstrated consistent multiple swallows across all consistencies. Delayed cough noted following all thin liquid trials + one instance following mildly thick liquid trials.    Initiated EMST-Lite however unable to successfully x5 repetitions 2/2 reduced labial closure/strength on left side likely related to recent dx of Bell's Palsy. Attempted manual closure and adjustment of placement however remained unsuccessful. Attempted effortful swallow following EMST-Lite however pt presented with increased fatigue and unable to complete this date.   Orientation Goals   Therapy Activities Orientation log score   Orientation Log Score (Out of 30) 23/30   Therapy Activity Comments Ongoing logical/environmental cues for specific place/date concepts.   Problem Solving Goals   Therapy Activities Sequencing   Therapy Activity Comments Sequencing targeted with transfers + oral cares. Ongoing verbal/visual cues for hand/feet placement in addition to sequencing throughout transfer. Set-up assist provided for oral care with brushing teeth and cues for initiation/termination.         Assessment Ongoing s/s aspiration w/ PO trials. Fatigue and oral/facial weakness impacting accuracy/completion of attempted swallow exercises.   Plan Cognition:  -- O-log  -- Simple attention/memory  -- Visual scanning  -- Medication management  -- Finance management     Dysphagia:  -- PO trials thin/mildly thick liquids, pureed solids  -- EMST-Lite  -- Swallow exercises as able  -- Consider IOPI     Miriam Dills, MA CCC-SLP

## 2023-10-31 NOTE — Progress Notes
 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  male admitted to [facility] on 10/28/2023 with the following issues: stroke      Rehabilitation Plan  Tentative discharge date: 11/19/2023  Rehabilitation: Patient will continue with comprehensive therapies including physical therapy, occupational therapy, speech & language pathology, specialized rehab nursing, neuropsychology and physiatry oversight.    Goals:  Patient Will Perform Basic Cares With: Partial/moderate assistance, Caregiver assist of one  Patient Will Perform Household Mobility With: Partial/moderate assistance, At wheelchair level  Recommended therapy after discharge: Consistent supervision, Home setting with home health, Versus, Skilled nursing facility  PT recommended equipment: Too early to determine (likely RW and wheelchair Web designer))  OT recommended equipment: Tub transfer bench    Daily Functional Update:  Stand to Sit Assist Level: Substantial/maximal assistance (10/31/2023  1:30 PM)  Equipment: Roller walker (10/31/2023  1:30 PM)     Stand Pivot Assist Level: Dependent (10/31/2023  8:15 AM)  Equipment: Non powered stand aid Jayson Ip) (10/31/2023  8:15 AM)     Manuel No data recorded  No data recorded   Gait Distance: 5 + 10 + 20 (10/31/2023  1:30 PM)    Assist Level: Dependent (10/31/2023  1:30 PM)    Equipment: Roller walker; Wheelchair follow (10/31/2023  1:30 PM)     Wheelchair No data recorded  No data recorded   Toileting       Assist Level: Dependent (10/30/2023  2:00 PM)  Equipment: Grab bar (10/30/2023  2:00 PM)     Toilet Transfer No data recorded   Upper Body Dressing Assist Level: Substantial/maximal assistance (10/29/2023  1:00 PM)     Lower Body Dressing Assist Level: Substantial/maximal assistance (10/29/2023  1:00 PM)       Diet: PO: Ice chips only (10/29/2023  9:15 AM)      Orientation Log & Cognitive Log Scores for the past 72 hrs (Last 3 readings):   Orientation Log Score (Out of 30)   10/31/23 0845 23/30           Current Medical Problems/Risks of Medical Complications/Management    Acute Right Pontomedullary Ischemic Stroke  Proximal Right Intracranial Vertebral Artery Occlusion  Diffuse Intracranial Atherosclerosis  Bilateral Horizontal  Gaze Palsy  History of Forced Left Gaze Deviation  Left Facial Palsy  Impairments: left hemiparesis, expressive aphasia, oropharyngeal dysphagia, right facial weakness, left facial droop with dysarthria, diplopia, concern for attention/cognitive impairments, LUE ataxia, neurogenic bowel, neurogenic bladder  Impaired mobility and ADL, impaired swallow and cognition/communication               > consult PT/OT/SLP and neuropsychology to evaluate and treat               > consider discussion with patient/family regarding the use of modafinil if daytime fatigue becomes an ongoing issue               > Sleep optimization: melatonin 5mg  qhs, previously on seroquel nightly               > follow up: Neurology, PM&R, Cardiology     Increased Somnolence - Resolved  - Concern for increased somnolence 5/20 leading to Neurology Consult- rec CTA H&N; discussed with team may attempt to liberalize BP I.e. decreasing amlodipine to 5mg  due to symptoms possibly 2/2 to hypoperfusion  - CTA Head and Neck - negative for acute change  - Infectious workup including UA, CXR- negative for acute concerns  > appreciate further neurology recommendations- if acute focal weakness, numbness, coma, etc. Stroke activate/send to ED as he is high risk to have progression of his stroke/basilar involvement. Signed off.      Secondary Stroke Risk Optimization  > Antiplatelet/Anticoagulation: DAPT with aspirin 81mg  daily and clopidogrel 75mg  daily for 90 days followed by aspirin 81mg  indefinitely  > Blood Pressure Goal: <160 during acute hospitalization, long term goal of < 130/80               - Current antihypertensive medications: amlodipine 5mg  daily, lisinopril 20mg  daily  > Lipids: LDL is 161, with goal of <70: rosuvastatin 40mg  daily (per Cardiology recs)  > Blood Glucose: Hgb A1c is 11.7%, with goal of <7  > Tobacco abstinence goal     At risk for Spasticity               > consider trial of baclofen if indicated, discuss potential benefits and side effects               > daily ROM with therapy      Dysphagia s/p PEG tube placement  Moderate Malnutrition  - PEG tube placement 5/15               > Dietician consult, appreciate tube feed recommendations- transition from continuous to bolus feeds 5/19               Recommend Jevity 1.5 bolus feeds, 5 cartons/day + 1 prosource pkt.     Recommend 1/2 carton per bolus for first 1-2 feeds, then proceed to goal regimen.    This will provide 1857kcal, 96g protein, free H2O.    Recommend 30mL free H2O flush before and after each bolus + 100mL q4h to meet fluid needs.   To help with loose stools, before interrupting EN support:    Avoid sorbitol containing medications such as liquid tylenol as these can make diarrhea worse; recommend switching to crushable tablet form.    Consider probiotic.    If loose stools persist, recommend adding 1-2 packets of nutrisource fiber per day.      Aspiration Pneumonia  S/p AHRF  - increased O2 req overnight 5/13 with elevated procal, tachycardia, leukocytosis  - CXR 5/13  notable for mild basal opacities, favored to represent atelectasis  - s/p vanc x1 and zosyn x3 days  - sputum cx NGTD               > Augmentin to complete total 7 day course of antibiotics through 5/20 AM     Left Bell's Palsy               > Alternate eye patches; eyepatch for the L eye overnight, artificial tears TID daily in the setting of left facial palsy     Pseudobulbar Affect  > Per Neurology: Consider starting Dextromethorphan-Quinidine in the outpatient setting (unfortunately unavailble on the formulary inpatient) for Pseudobulbar affect, will start Fluoxetine 20mg  in the meantime --> instead was started on Sertraline 50mg , will continue while in rehab  > Neurology clinic follow up on dc     Oral Thrush               > oral care TID   > Nystatin swish and spit started 5/20, ok'd by SLP     Bowel Management  At risk for Neurogenic Bowel  Loose Stools  -Last Bowel Movement Date: 10/30/23 (10/31/2023 10:05 AM)  - Goal for a daily BM or every 24-48 hours.   - c diff negative 5/18  > adjust bowel regimen PRN   > loperamide PRN     Bladder Management  Neurogenic Bladder with Retention       -Post-Void (PVR) Bladder Scan (mL): 451 milliliters (10/29/2023  6:13 PM)  -Straight Cath (mL): 350 (10/31/2023  2:05 AM)  > Monitor urinary retention per unit protocol. If unable to void or PVRs >300cc perform clean ISC (intermittent straight catheterization) for volumes > . Stop checking BVIs if 3 consecutive volumes <150cc.   > Consider foley placement  >  add doxazosin 1mg  QHS 5/21 - consider increasing to 2mg  5/27     Hypertension  Orthostatic Hypotension  LVOT Obstruction  PFO  CAD s/p PCI               > metoprolol 25mg  BID               > amlodipine 10mg  daily decreased to 5mg  5/20, lisinopril 20mg  daily               > 30 day MCOT    > compression stockings to prevent sx orthostatic hypotension               > Follow up with outpatient Cardiology      Diabetes Mellitus               > Endocrinology consult for ongoing discharge regimen planning     Depression/Mood screen :          > neuropsychology to evaluate and treat for adjustment to new deficits          > Encouragement and support    Nutrition:   Body mass index is 21.8 kg/m?SABRA  Malnutrition Details:  Malnutrition present on admission  ICD-10 code E44: Chronic illness/Moderate non-severe malnutrition      Mild loss of muscle mass, Mild loss of body fat, Energy intake: Less than 75% of estimated energy requirement for 1 month or more        Loss of Subcutaneous Fat: Yes Mild Orbital, Triceps  Muscle Wasting: Yes Moderate Clavicle, Temple, Deltoid               Malnutrition Interventions: EN recs  to meet 100% estimated kcal and protein needs    Wounds:        DVT ppx: enoxaparin (LOVENOX) syringe 40 mg  QDAY(21); patient will not require at the time of discharge    Code Status: Full Code    Current Medications:  Scheduled Meds:amLODIPine (NORVASC) tablet 5 mg, 5 mg, Per NG tube, QDAY  artificial tears (PF) single dose ophthalmic solution 1 drop, 1 drop, Left Eye, TID  aspirin chewable tablet 81 mg, 81 mg, Per NG tube, QDAY  clopiDOGreL (PLAVIX) tablet 75 mg, 75 mg, Per NG tube, QDAY  Diet Enteral Feeding Bolus, , SEE ADMIN INSTRUCTIONS, QID (3,87,83,79)   And  Water Bolus, 100 mL, PEG Tube, QID (3,87,83,79)  doxazosin (CARDURA) tablet 1 mg, 1 mg, Feeding Tube, QHS  enoxaparin (LOVENOX) syringe 40 mg, 40 mg, Subcutaneous, QDAY(21)  insulin aspart (U-100) (NOVOLOG FLEXPEN U-100 INSULIN) injection PEN 0-12 Units, 0-12 Units, Subcutaneous, QID  insulin aspart (U-100) (NOVOLOG FLEXPEN U-100 INSULIN) injection PEN 14 Units, 14 Units, Subcutaneous, QID  insulin glargine (LANTUS SOLOSTAR U-100 INSULIN) injection PEN 18 Units, 18 Units, Subcutaneous, QDAY  lisinopriL (ZESTRIL) tablet 20 mg, 20 mg, Feeding Tube, QDAY  melatonin tablet 5 mg, 5 mg, Feeding Tube, QHS  metoprolol tartrate tablet 25 mg, 25 mg, Feeding Tube, BID  nystatin (MYCOSTATIN) oral suspension 500,000 Units, 500,000 Units, Swish & Spit, QID  petrolatum (STYE) ophthalmic ointment 0.25 inch, 0.25 inch, Both Eyes, TID  rosuvastatin (CRESTOR) tablet 40 mg, 40 mg, Feeding Tube, QDAY  sertraline (ZOLOFT) tablet 50 mg, 50 mg, Feeding Tube, QDAY  thiamine mononitrate (vit B1) tablet 100 mg, 100 mg, Per NG tube, QDAY    Continuous Infusions:  PRN and Respiratory Meds:acetaminophen Q8H PRN, bisacodyL QDAY PRN, dextrose 50% (D50) IV PRN, diclofenac sodium QID PRN, lidocaine hcl PRN, loperamide TID PRN, pancrelipase 20,880 Units/sodium bicarbonate 650 mg ( CLOG DESTROYER) PRN (On Call from Rx), phenoL PRN      Subjective     Seen at bedside this AM while sitting in bedside wheelchair, alert and interactive. No acute overnight events reported. Pt reports he was able to sleep okay. Denies pain. Discussed hope doxazosin will help urinary retention. Last bowel movement 5/21. No acute concerns at this time.    Objective                          Vital Signs: Last Filed                 Vital Signs: 24 Hour Range   BP: 120/49 (05/22 1449)  Temp: 36.7 ?C (98 ?F) (05/22 1449)  Pulse: 66 (05/22 1449)  Respirations: 16 PER MINUTE (05/22 1449)  SpO2: 100 % (05/22 1449)  O2 Device: None (Room air) (05/22 1449) BP: (120-167)/(49-65)   Temp:  [36.7 ?C (98 ?F)-37.1 ?C (98.7 ?F)]   Pulse:  [66-83]   Respirations:  [16 PER MINUTE-17 PER MINUTE]   SpO2:  [93 %-100 %]   O2 Device: None (Room air)     Vitals:    10/28/23 1324 10/30/23 1511   Weight: 59.7 kg (131 lb 9.8 oz) 59.4 kg (131 lb)       Intake/Output Summary:  (Last 24 hours)    Intake/Output Summary (Last 24 hours) at 10/31/2023 1606  Last data filed at 10/31/2023 1213  Gross per 24 hour   Intake 2479 ml   Output 770 ml   Net 1709 ml  Last Bowel Movement Date: 10/30/23    Labs--reviewed  Results for orders placed or performed during the hospital encounter of 10/28/23 (from the past 24 hours)   POC GLUCOSE    Collection Time: 10/30/23  4:43 PM   # # Low-High    Glucose, POC 259 (H) 70 - 100 mg/dL   POC GLUCOSE    Collection Time: 10/30/23  7:25 PM   # # Low-High    Glucose, POC 309 (H) 70 - 100 mg/dL   POC GLUCOSE    Collection Time: 10/31/23  5:43 AM   # # Low-High    Glucose, POC 149 (H) 70 - 100 mg/dL   CBC CELLULAR THERAPEUTICS    Collection Time: 10/31/23  6:26 AM   # # Sharleen    White Blood Cells 7.90 4.50 - 11.00 10*3/uL    Red Blood Cells 4.18 (L) 4.40 - 5.50 10*6/uL    Hemoglobin 12.5 (L) 13.5 - 16.5 g/dL    Hematocrit 64.4 (L) 40.0 - 50.0 %    MCV 84.9 80.0 - 100.0 fL    MCH 30.0 26.0 - 34.0 pg    MCHC 35.3 32.0 - 36.0 g/dL    RDW 87.6 88.9 - 84.9 %    Platelet Count 287 150 - 400 10*3/uL    MPV 8.4 7.0 - 11.0 fL   BASIC METABOLIC PANEL CELLULAR THERAPEUTICS    Collection Time: 10/31/23  6:26 AM   # # Low-High    Sodium 139 137 - 147 mmol/L    Potassium 4.0 3.5 - 5.1 mmol/L    Chloride 103 98 - 110 mmol/L    Glucose 153 (H) 70 - 100 mg/dL    Blood Urea Nitrogen 15 7 - 25 mg/dL    Creatinine 9.27 9.59 - 1.24 mg/dL    Calcium 9.1 8.5 - 89.3 mg/dL    CO2 26 21 - 30 mmol/L    Anion Gap 10 3 - 12    Glomerular Filtration Rate (GFR) >60 >60 mL/min   POC GLUCOSE    Collection Time: 10/31/23  6:46 AM   # # Low-High    Glucose, POC 172 (H) 70 - 100 mg/dL   POC GLUCOSE    Collection Time: 10/31/23 11:31 AM   # # Low-High    Glucose, POC 164 (H) 70 - 100 mg/dL         Physical Exam  General: no distress sitting in bedside wheelchair  HEENT: oral mucosa moist   Heart: Extremities warm and well perfused  Lungs: Normal work of breathing on room air  Abdomen: Soft, non-distended  Extremities: Peripheral edema is not present  Psych: mood and affect seemingly congruent  Neuro: Alert, interactive, responds to simple questions however hypophonic and dysarthric. Left facial paralysis, bilateral horizontal gaze palsy appreciated. MMT notable for 4+/5 of the L shoulder abduction, 4/5 LUE otherwise throughout, 5/5 bilateral knee extension.       Labs & Therapy Notes Reviewed    Plan discussed with attending, Dr. Laurance Corean CHRISTELLA Andria, DO   Physical Medicine & Rehabilitation  For patient care questions contact Rehab Medicine 3 FIRST CALL via Voalte.

## 2023-10-31 NOTE — Progress Notes
 Brief Endocrine Note    Patient chart reviewed and not physically seen today. Glucose values reviewed as below:     Glucose, POC   Date/Time Value Ref Range Status   10/31/2023 1131 164 (H) 70 - 100 mg/dL Final   94/77/7974 9353 172 (H) 70 - 100 mg/dL Final   94/77/7974 9456 149 (H) 70 - 100 mg/dL Final   94/78/7974 8074 309 (H) 70 - 100 mg/dL Final   94/78/7974 8356 259 (H) 70 - 100 mg/dL Final   94/78/7974 8876 223 (H) 70 - 100 mg/dL Final   94/78/7974 9474 136 (H) 70 - 100 mg/dL Final   94/79/7974 7950 294 (H) 70 - 100 mg/dL Final   93/94/7977 7993 158 (H) 70 - 100 MG/DL Final   93/94/7977 8081 257 (H) 70 - 100 MG/DL Final   93/94/7977 8242 397 (H) 70 - 100 MG/DL Final   89/88/7978 8567 105 (H) 70 - 100 MG/DL Final   90/85/7979 8280 129 (H) 70 - 100 MG/DL Final   90/85/7979 9340 126 (H) 70 - 100 MG/DL Final   90/85/7979 9655 182 (H) 70 - 100 MG/DL Final   88/78/7980 9048 266  Final       Recommendations:   - Continue current insulin plan    Will continue to follow.     Omega Blunt, MD  Endocrinology, Metabolism, Clinical Pharmacology   Pager: [phone]  Available on Voalte

## 2023-10-31 NOTE — Progress Notes
 OCCUPATIONAL THERAPY  NOTE   Name: Kevin Obrien   MRN: [id number]     DOB: [birth year]      Age: 73 y.o.  Admission Date: 10/28/2023     LOS: 3 days     Date of Service: 10/31/2023         10/31/23 1130   Type of Note   Type of Note Daily   Time Calculation   Start Time 1130   Stop Time 1200   Time Calculation 30   $$ Phys ADL Skills 2 units   Subjective   Subjective Pt seated in bed on arrival/exit with alarm on and needs met/in reach   Pain   Pain Scale 0-10 Scale   Intensity Pain   Intensity Pain Scale (Self Report) 8   Words to Describe Pain Headache   Location of Pain Head   Pain Intervention(s) Medication (see MAR)  (RN notified; gave med)   Precautions   Comments L sided weakness, visual impairment (horizontal gaze palsy), PEG   Toileting   Patient Needs Assistance With Adjusting clothing before toileting;Wiping self;Adjusting clothing after toileting;Balance   Assist Level Dependent   Position Standing for pants management;Standing for wiping   Comments Ax2   Toilet Transfer   Patient Needs Assistance With Facilitation of trunk;Facilitation of weight shift;Increased time to complete;Balance;Verbal cues;Transferring on;Transferring off;Assist of two;Blocking of LLE;Blocking of RLE;Facilitation of hip extension   Transfer Type Non powered stand aid (i.e. Camie Ip)   Assist Level Dependent   Equipment Non powered stand aid Jayson Ip)   Comments Ax2   Daily Care   Bladder Management Comments attempted void, no output   Sit to and from Stand   Sit to and from Stand Sit to stand;Stand to sit   Sit to Stand   Patient Needs Assistance With Trunk;Extra time to complete activity;Blocking of left lower extremity;Blocking of right lower extremity;Facilitation of hip extension;Facilitation of trunk;Facilitation of weight shift;Cues for positioning of hands;Cues for sequencing;Balance;Assist of two   Assist Level Dependent   Equipment Non powered stand aid Jayson Ip)   Stand to Sit   Patient Needs Assistance With Trunk;Facilitation of weight shift;Balance;Assist of two   Assist Level Dependent   Equipment Non powered stand aid Jayson Ip)   Chair/Bed-to-Chair Transfer   Chair/Bed-to-Chair Transfer Dependent lift   Dependent Lift Transfer   Patient Needs Assistance With Trunk;Extra time to complete activity;Blocking of left lower extremity;Blocking of right lower extremity;Facilitation of hip extension;Facilitation of trunk;Facilitation of weight shift;Cues for positioning of hands;Assist of two   Assist Level Dependent   Equipment Non powered stand aid Jayson Ip)   Comments bed<>toilet   Assessment   Assessment Patient continues to require Ax2 to complete transfers and toileting this session. Continue progressing towards goals   Plan   OT Plan LUE NMR, vision assessment (5/23), dynamic sitting balance, progress transfers, standing balance, standing for ADLs as able, ADLs   Recommendations   OT Discharge Recommendations Consistent supervision;Home setting with home health;Versus;Skilled nursing facility   OT Equipment Recommendations Tub transfer bench         Therapist: Phyliss Quale, OTD, OTR/L  Date: 10/31/2023

## 2023-11-01 LAB — BASIC METABOLIC PANEL CELLULAR THERAPEUTICS
~~LOC~~ BKR ANION GAP: 10 % (ref 3–12)
~~LOC~~ BKR BLD UREA NITROGEN: 16 mg/dL — ABNORMAL HIGH (ref 7–25)
~~LOC~~ BKR CALCIUM: 9.2 mg/dL — ABNORMAL HIGH (ref 8.5–10.6)
~~LOC~~ BKR CHLORIDE: 100 mmol/L (ref 98–110)
~~LOC~~ BKR CO2: 26 mmol/L — ABNORMAL LOW (ref 21–30)
~~LOC~~ BKR CREATININE: 0.7 mg/dL (ref 0.40–1.24)
~~LOC~~ BKR GLOMERULAR FILTRATION RATE (GFR): >60 mL/min (ref >60–5.0)
~~LOC~~ BKR POTASSIUM: 4.7 mmol/L — ABNORMAL LOW (ref 3.5–5.1)
~~LOC~~ BKR SODIUM, SERUM: 136 mmol/L — ABNORMAL LOW (ref 137–147)

## 2023-11-01 LAB — POC GLUCOSE
~~LOC~~ BKR POC GLUCOSE: 229 mg/dL — ABNORMAL HIGH (ref 70–100)
~~LOC~~ BKR POC GLUCOSE: 167 mg/dL — ABNORMAL HIGH (ref 70–100)
~~LOC~~ BKR POC GLUCOSE: 231 mg/dL — ABNORMAL HIGH (ref 70–100)
~~LOC~~ BKR POC GLUCOSE: 252 mg/dL — ABNORMAL HIGH (ref 70–100)

## 2023-11-01 LAB — CBC CELLULAR THERAPEUTICS
~~LOC~~ BKR MCH: 30 pg — ABNORMAL HIGH (ref 26.0–34.0)
~~LOC~~ BKR RBC COUNT: 4.1 10*6/uL — ABNORMAL LOW (ref 4.40–5.50)
~~LOC~~ BKR WBC COUNT: 7.5 10*3/uL — ABNORMAL LOW (ref 4.50–11.00)

## 2023-11-01 MED ORDER — DOXAZOSIN 2 MG PO TAB
2 mg | Freq: Every evening | GASTROSTOMY | 0 refills | Status: DC
Start: 2023-11-01 — End: 2023-11-06
  Administered 2023-11-02 – 2023-11-06 (×5): 2 mg via GASTROSTOMY

## 2023-11-01 NOTE — Progress Notes
 SPEECH-LANGUAGE PATHOLOGY       11/01/23 0830   Behavior   Comments* Pt seated in wheelchair in room and had just finished with OT session. Pt agreeable to ST and participatory throughout.   Dysphagia Goals   Therapy Activities PO trials;Swallow exercises   Swallow exercises Lingual isometric exercises ([place]  oral performance instrument-IPOI)   PO Trials Ice chips;Thin liquids;Mildly thick liquids;Puree   Therapy Activity Comments Pt provided with PO trials ice chips x2, thin liquids via controlled volume sips (cup/straw), mildly thick liquids via controlled volumes (cup/straw), and pureed solids. All liquid trials provided via 5cc medicine cup. L-sided anterior loss x1 in which pt demonstrated sensory awareness. Ongoing suspicion for premature spillage + delayed swallow initiation based upon observation and previous FEES however subjectively appeared to be slightly improved w/ mildly thick liquids. Verbal cues for swallow initiation with pureed solids and use of single bites as pt often attempting to consumed 3-4 small bites before swallow initiation. Intermittent vocal wetness noted requiring cues for throat clear. No evidence of coughing this date however pt has hx of silent aspiration.    Utilized [place]  Oral Performance Instrument (IOPI) this date  Peak Lingual Strength: 13kPa (13 x1, 9x2 when determining max)  Lingual Endurance: 5kPa (initially started at [facility] however increased difficulty noted requiring adjustment) variable for 17-31 seconds  Pt completed x6 repetitions 2/2 time constraints   Orientation Goals   Therapy Activities Orientation log score   Orientation Log Score (Out of 30) 20/30   Therapy Activity Comments -3 points from previous administration. Logical/environmental cues effective for date concepts. Reduced recall/awareness for hospitalization and pathology deficits this date.         Assessment Progressing towards functional goals.   Plan Cognition:  -- O-log  -- Simple attention/memory  -- Visual scanning  -- Medication management  -- Finance management     Dysphagia:  -- PO trials thin/mildly thick liquids, pureed solids  -- EMST-Lite  -- Swallow exercises as able  -- IOPI  -- VSS next week     Miriam Dills, MA CCC-SLP

## 2023-11-01 NOTE — Progress Notes
 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 Yes    Consult Type: Co-Management w/Signed Orders    Ordering Provider's or Responsible Teams's Phone/Pager number? Number not on file              Assessment / Plan     Admission date and time: 10/28/2023  1:05 PM     Type 2 Diabetes Mellitus uncontrolled  Insulin dose changed  Non adherance to meds  Acute stroke   Dyslipidemia   Tube feed hyperglycemia   A1c 11.7, 10/17/2023   PTA regimen: non compliant, was supposed to be on meal time insulin. Was taking very sporadically   Was not taking any PO meds either   Hypoglycemic episodes on this regimen: none   Follows up with ? PCP for diabetes management  Diabetic-complications assessment:  CAD, stroke, neuropathy     Aspiration pneumonia    Current diet: Jevity 1.5 TF QID  Glucocorticoids: none     Diabetes Regimen Changes today based on glucose values:    Continue insulin glargine 18u every day   Continue Aspart 15 units with each bolus feed  Continue correction factor to QID with bolus feeds    Goal BG 140-180.     Diabetes Plans Post Discharge  Diabetes regimen: TBD  He will most likely need insulin on discharge.   We will request for outpatient follow-up. Family would like to come to [facility]. He used to see [facility] and last visit was in 2020    Subjective:      Patient seen at the bedside. No acute complaints. No nausea, vomiting, or abdominal pain. Discussed continuing to check blood sugar and adjust as needed.     Vital Signs: Last Filed In 24 Hours Vital Signs: 24 Hour Range   BP: 160/69 (05/23 0824)  Temp: 36.9 ?C (98.4 ?F) (05/23 9175)  Pulse: 76 (05/23 0824)  Respirations: 16 PER MINUTE (05/23 0824)  SpO2: 96 % (05/23 0824)  O2 Device: None (Room air) (05/23 0824) BP: (120-160)/(49-69)   Temp:  [36.7 ?C (98 ?F)-36.9 ?C (98.5 ?F)]   Pulse:  [66-76]   Respirations:  [16 PER MINUTE]   SpO2:  [96 %-100 %]   O2 Device: None (Room air)  Intensity Pain Scale (Self Report): 5 (10/31/23 1311)      Peg tube feeds running  Patient is awake  Breathing on room air.  No CP      Current Facility-Administered Medications:     acetaminophen (TYLENOL EXTRA STRENGTH) tablet 1,000 mg, 1,000 mg, Feeding Tube, Q8H PRN, Fortune, Stephanie M, DO, 1,000 mg at 10/31/23 1311    amLODIPine (NORVASC) tablet 5 mg, 5 mg, Per NG tube, QDAY, Fortune, Stephanie M, DO, 5 mg at 11/01/23 0825    artificial tears (PF) single dose ophthalmic solution 1 drop, 1 drop, Left Eye, TID, Fortune, Stephanie M, DO, 1 drop at 11/01/23 0825    aspirin chewable tablet 81 mg, 81 mg, Per NG tube, QDAY, Fortune, Stephanie M, DO, 81 mg at 11/01/23 9175    bisacodyL (DULCOLAX) rectal suppository 10 mg, 10 mg, Rectal, QDAY PRN, Fortune, Stephanie M, DO    clopiDOGreL (PLAVIX) tablet 75 mg, 75 mg, Per NG tube, QDAY, Fortune, Stephanie M, DO, 75 mg at 11/01/23 0824    dextrose 50% (D50) syringe 25-50 mL, 12.5-25 g, Intravenous, PRN, Fortune, Stephanie M, DO    diclofenac sodium (VOLTAREN) 1 % topical gel 2 g, 2 g, Topical, QID PRN, Fortune, Stephanie M, DO    Diet Enteral Feeding Bolus, , SEE ADMIN INSTRUCTIONS, QID (3,87,83,79), 1.25 each at 11/01/23 1241 **AND** Water Bolus, 100 mL, PEG Tube, QID (6,12,16,20), Sarmiento, Khulan Z, DO, 100 mL at 11/01/23 1242    doxazosin (CARDURA) tablet 2 mg, 2 mg, Feeding Tube, QHS, Sarmiento, Khulan Z, DO    enoxaparin (LOVENOX) syringe 40 mg, 40 mg, Subcutaneous, QDAY(21), Fortune, Stephanie M, DO, 40 mg at 10/31/23 2055    insulin aspart (U-100) (NOVOLOG FLEXPEN U-100 INSULIN) injection PEN 0-12 Units, 0-12 Units, Subcutaneous, QID, Zaniah [place]itterington K, MD, 4 Units at 11/01/23 1241    insulin aspart (U-100) (NOVOLOG FLEXPEN U-100 INSULIN) injection PEN 14 Units, 14 Units, Subcutaneous, QID, Mahagony Grieb K, MD, 14 Units at 11/01/23 1241    insulin glargine (LANTUS SOLOSTAR U-100 INSULIN) injection PEN 18 Units, 18 Units, Subcutaneous, QDAY, Fortune, Stephanie M, DO, 18 Units at 11/01/23 1241    lidocaine hcl (XYLOCAINE) 2 % jelly (URO-JET), , Topical, PRN, Fortune, Stephanie M, DO, Given at 10/30/23 1530    lisinopriL (ZESTRIL) tablet 20 mg, 20 mg, Feeding Tube, QDAY, Fortune, Stephanie M, DO, 20 mg at 11/01/23 9175    loperamide (IMODIUM A-D) capsule 2 mg, 2 mg, Feeding Tube, TID PRN, Fortune, Stephanie M, DO, 2 mg at 10/29/23 1834    melatonin tablet 5 mg, 5 mg, Feeding Tube, QHS, Fortune, Stephanie M, DO, 5 mg at 10/31/23 2053    metoprolol tartrate tablet 25 mg, 25 mg, Feeding Tube, BID, Fortune, Stephanie M, DO, 25 mg at 11/01/23 9175    nystatin (MYCOSTATIN) oral suspension 500,000 Units, 500,000 Units, Swish & Spit, QID, Fortune, Stephanie M, DO, 500,000 Units at 11/01/23 1241    pancrelipase 20,880 Units/sodium bicarbonate 650 mg ([place] CLOG DESTROYER), , Feeding Tube, PRN (On Call from Rx), Fortune, Stephanie M, DO    petrolatum (STYE) ophthalmic ointment 0.25 inch, 0.25 inch, Both Eyes, TID, Fortune, Stephanie M, DO, 0.25 inch at 11/01/23 0826    phenoL (CHLORASEPTIC) spray 2 spray, 2 spray, Mouth/Throat, PRN, Fortune, Stephanie M, DO    rosuvastatin (CRESTOR) tablet 40 mg, 40 mg, Feeding Tube, QDAY, Fortune, Stephanie M, DO, 40 mg at 11/01/23 0824    sertraline (ZOLOFT) tablet 50 mg, 50 mg, Feeding Tube, QDAY,  Fortune, [place], DO, 50 mg at 11/01/23 0825    thiamine mononitrate (vit B1) tablet 100 mg, 100 mg, Per NG tube, QDAY, Fortune, Stephanie M, DO, 100 mg at 11/01/23 9177    Basic Metabolic Profile    Lab Results   Component Value Date/Time    NA 136 (L) 11/01/2023 06:25 AM    K 4.7 11/01/2023 06:25 AM    CA 9.2 11/01/2023 06:25 AM    CL 100 11/01/2023 06:25 AM    CO2 26 11/01/2023 06:25 AM    GAP 10 11/01/2023 06:25 AM    EGFR1 >60 11/13/2020 05:51 PM    Lab Results   Component Value Date/Time    BUN 16 11/01/2023 06:25 AM    CR 0.70 11/01/2023 06:25 AM    GLU 150 (H) 11/01/2023 06:25 AM          Electronically signed by Omega MARLA Blunt, MD 11/01/2023

## 2023-11-01 NOTE — Progress Notes
 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  male admitted to [facility] on 10/28/2023 with the following issues: stroke      Rehabilitation Plan  Tentative discharge date: 11/19/2023  Rehabilitation: Patient will continue with comprehensive therapies including physical therapy, occupational therapy, speech & language pathology, specialized rehab nursing, neuropsychology and physiatry oversight.    Goals:  Patient Will Perform Basic Cares With: Partial/moderate assistance, Caregiver assist of one  Patient Will Perform Household Mobility With: Partial/moderate assistance, At wheelchair level  Recommended therapy after discharge: Consistent supervision, Home setting with home health, Versus, Skilled nursing facility  PT recommended equipment: Too early to determine (likely RW and wheelchair Web designer))  OT recommended equipment: Tub transfer bench    Daily Functional Update:  Stand to Sit Assist Level: Dependent (11/01/2023  8:40 AM)  Equipment: Non powered stand aid Jayson Ip) (11/01/2023  8:40 AM)     Stand Pivot Assist Level: Dependent (10/31/2023  8:15 AM)  Equipment: Non powered stand aid Jayson Ip) (10/31/2023  8:15 AM)     Manuel No data recorded  No data recorded   Gait Distance: 5 + 10 + 20 (10/31/2023  1:30 PM)    Assist Level: Dependent (10/31/2023  1:30 PM)    Equipment: Roller walker; Wheelchair follow (10/31/2023  1:30 PM)     Wheelchair No data recorded  No data recorded   Toileting       Assist Level: Dependent (10/31/2023  1:00 PM)  Equipment: Grab bar (10/30/2023  2:00 PM)     Toilet Transfer No data recorded   Upper Body Dressing Assist Level: Substantial/maximal assistance (10/29/2023  1:00 PM)     Lower Body Dressing Assist Level: Substantial/maximal assistance (10/29/2023  1:00 PM)       Diet: PO: Ice chips only (10/29/2023  9:15 AM)      Orientation Log & Cognitive Log Scores for the past 72 hrs (Last 3 readings):   Orientation Log Score (Out of 30)   11/01/23 0830 20/30   10/31/23 0845 23/30           Current Medical Problems/Risks of Medical Complications/Management    Acute Right Pontomedullary Ischemic Stroke  Proximal Right Intracranial Vertebral Artery  Occlusion  Diffuse Intracranial Atherosclerosis  Bilateral Horizontal Gaze Palsy  History of Forced Left Gaze Deviation  Left Facial Palsy  Impairments: left hemiparesis, expressive aphasia, oropharyngeal dysphagia, right facial weakness, left facial droop with dysarthria, diplopia, concern for attention/cognitive impairments, LUE ataxia, neurogenic bowel, neurogenic bladder  Impaired mobility and ADL, impaired swallow and cognition/communication               > consult PT/OT/SLP and neuropsychology to evaluate and treat               > consider discussion with patient/family regarding the use of modafinil if daytime fatigue becomes an ongoing issue               > Sleep optimization: melatonin 5mg  qhs, previously on seroquel nightly               > follow up: Neurology, PM&R, Cardiology     Increased Somnolence - Resolved  - Concern for increased somnolence 5/20 leading to Neurology Consult- rec CTA H&N; discussed with team may attempt to liberalize BP I.e. decreasing amlodipine to 5mg  due to symptoms possibly 2/2 to hypoperfusion  - CTA Head and Neck - negative for acute change  - Infectious workup including UA, CXR- negative for acute concerns  > appreciate further neurology recommendations- if acute focal weakness, numbness, coma, etc. Stroke activate/send to ED as he is high risk to have progression of his stroke/basilar involvement. Signed off.      Secondary Stroke Risk Optimization  > Antiplatelet/Anticoagulation: DAPT with aspirin 81mg  daily and clopidogrel 75mg  daily for 90 days followed by aspirin 81mg  indefinitely  > Blood Pressure Goal: <160 during acute hospitalization, long term goal of < 130/80               - Current antihypertensive medications: amlodipine 5mg  daily, lisinopril 20mg  daily  > Lipids: LDL is 161, with goal of <70: rosuvastatin 40mg  daily (per Cardiology recs)  > Blood Glucose: Hgb A1c is 11.7%, with goal of <7  > Tobacco abstinence goal     At risk for Spasticity               > consider trial of baclofen if indicated, discuss potential benefits and side effects               > daily ROM with therapy      Dysphagia s/p PEG tube placement  Moderate Malnutrition  - PEG tube placement 5/15               > Dietician consult, appreciate tube feed recommendations- transition from continuous to bolus feeds 5/19               Recommend Jevity 1.5 bolus feeds, 5 cartons/day + 1 prosource pkt.     Recommend 1/2 carton per bolus for first 1-2 feeds, then proceed to goal regimen.    This will provide 1857kcal, 96g protein, free H2O.    Recommend 30mL free H2O flush before and after each bolus + 100mL q4h to meet fluid needs.   To help with loose stools, before interrupting EN support:    Avoid sorbitol containing medications such as liquid tylenol as these can make diarrhea worse; recommend switching to crushable tablet form.    Consider probiotic.    If loose stools persist, recommend adding 1-2 packets of nutrisource fiber per day.      Aspiration Pneumonia  S/p AHRF  - increased O2 req overnight 5/13 with  elevated procal, tachycardia, leukocytosis  - CXR 5/13 notable for mild basal opacities, favored to represent atelectasis  - s/p vanc x1 and zosyn x3 days  - sputum cx NGTD               > Augmentin to complete total 7 day course of antibiotics through 5/20 AM     Left Bell's Palsy               > Alternate eye patches; eyepatch for the L eye overnight, artificial tears TID daily in the setting of left facial palsy     Pseudobulbar Affect  > Per Neurology: Consider starting Dextromethorphan-Quinidine in the outpatient setting (unfortunately unavailble on the formulary inpatient) for Pseudobulbar affect, will start Fluoxetine 20mg  in the meantime --> instead was started on Sertraline 50mg , will continue while in rehab  > Neurology clinic follow up on dc     Oral Thrush               > oral care TID   > Nystatin swish and spit started 5/20, ok'd by SLP     Bowel Management  At risk for Neurogenic Bowel  Loose Stools  -Last Bowel Movement Date: 10/30/23 (10/31/2023  9:05 PM)  - Goal for a daily BM or every 24-48 hours.   - c diff negative 5/18  > adjust bowel regimen PRN   > loperamide PRN     Bladder Management  Neurogenic Bladder with Retention       -Post-Void (PVR) Bladder Scan (mL): 451 milliliters (10/29/2023  6:13 PM)  -Straight Cath (mL): 440 (11/01/2023  8:24 AM)  > Monitor urinary retention per unit protocol. If unable to void or PVRs >300cc perform clean ISC (intermittent straight catheterization) for volumes > . Stop checking BVIs if 3 consecutive volumes <150cc.   > Consider foley placement  >  doxazosin 2mg , increased 5/23     Hypertension  Orthostatic Hypotension  LVOT Obstruction  PFO  CAD s/p PCI               > metoprolol 25mg  BID               > amlodipine 10mg  daily decreased to 5mg  5/20, lisinopril 20mg  daily               > 30 day MCOT    > compression stockings to prevent sx orthostatic hypotension               > Follow up with outpatient Cardiology      Diabetes Mellitus               > Endocrinology consult for ongoing discharge regimen planning     Depression/Mood screen :          > neuropsychology to evaluate and treat for adjustment to new deficits          > Encouragement and support    Nutrition:   Body mass index is 21.8 kg/m?SABRA  Malnutrition Details:  Malnutrition present on admission  ICD-10 code E44: Chronic illness/Moderate non-severe malnutrition      Mild loss of muscle mass, Mild loss of body fat, Energy intake: Less than 75% of estimated energy requirement for 1 month or more        Loss of Subcutaneous Fat: Yes Mild Orbital, Triceps  Muscle Wasting: Yes Moderate Clavicle, Temple, Deltoid               Malnutrition Interventions:  EN recs to meet 100% estimated kcal and protein needs    Wounds:        DVT ppx: enoxaparin (LOVENOX) syringe 40 mg  QDAY(21); patient will not require at the time of discharge    Code Status: Full Code    Current Medications:  Scheduled Meds:amLODIPine (NORVASC) tablet 5 mg, 5 mg, Per NG tube, QDAY  artificial tears (PF) single dose ophthalmic solution 1 drop, 1 drop, Left Eye, TID  aspirin chewable tablet 81 mg, 81 mg, Per NG tube, QDAY  clopiDOGreL (PLAVIX) tablet 75 mg, 75 mg, Per NG tube, QDAY  Diet Enteral Feeding Bolus, , SEE ADMIN INSTRUCTIONS, QID (3,87,83,79)   And  Water Bolus, 100 mL, PEG Tube, QID (3,87,83,79)  doxazosin (CARDURA) tablet 2 mg, 2 mg, Feeding Tube, QHS  enoxaparin (LOVENOX) syringe 40 mg, 40 mg, Subcutaneous, QDAY(21)  insulin aspart (U-100) (NOVOLOG FLEXPEN U-100 INSULIN) injection PEN 0-12 Units, 0-12 Units, Subcutaneous, QID  insulin aspart (U-100) (NOVOLOG FLEXPEN U-100 INSULIN) injection PEN 14 Units, 14 Units, Subcutaneous, QID  insulin glargine (LANTUS SOLOSTAR U-100 INSULIN) injection PEN 18 Units, 18 Units, Subcutaneous, QDAY  lisinopriL (ZESTRIL) tablet 20 mg, 20 mg, Feeding Tube, QDAY  melatonin tablet 5 mg, 5 mg, Feeding Tube, QHS  metoprolol tartrate tablet 25 mg, 25 mg, Feeding Tube, BID  nystatin (MYCOSTATIN) oral suspension 500,000 Units, 500,000 Units, Swish & Spit, QID  petrolatum (STYE) ophthalmic ointment 0.25 inch, 0.25 inch, Both Eyes, TID  rosuvastatin (CRESTOR) tablet 40 mg, 40 mg, Feeding Tube, QDAY  sertraline (ZOLOFT) tablet 50 mg, 50 mg, Feeding Tube, QDAY  thiamine mononitrate (vit B1) tablet 100 mg, 100 mg, Per NG tube, QDAY    Continuous Infusions:  PRN and Respiratory Meds:acetaminophen Q8H PRN, bisacodyL QDAY PRN, dextrose 50% (D50) IV PRN, diclofenac sodium QID PRN, lidocaine hcl PRN, loperamide TID PRN, pancrelipase 20,880 Units/sodium bicarbonate 650 mg ([place] CLOG DESTROYER) PRN (On Call from Rx), phenoL PRN      Subjective     No acute overnight events reported. Pt seen at bedside. Alert and oriented to person, place [place] and year '25. Appreciate that pt may have refused ISC overnight. Pt required ISC x2 yesterday. Discussed with patient the importance of emptying bladder appropriately, and increasing doxazosin to 2mg  today for which he is agreeable. Pt slept well last evening. Denies further acute concerns at this time.     Objective                          Vital Signs: Last Filed                 Vital Signs: 24 Hour Range   BP: 130/54 (05/23 1305)  Temp: 36.7 ?C (98.1 ?F) (05/23 1305)  Pulse: 64 (05/23 1305)  Respirations: 16 PER MINUTE (05/23 1305)  SpO2: 98 % (05/23 1305)  O2 Device: None (Room air) (05/23 1305) BP: (120-160)/(49-69)   Temp:  [36.7 ?C (98 ?F)-36.9 ?C (98.5 ?F)]   Pulse:  [64-76]   Respirations:  [16 PER MINUTE]   SpO2:  [96 %-100 %]   O2 Device: None (Room air)     Vitals:    10/28/23 1324 10/30/23 1511   Weight: 59.7 kg (131 lb 9.8 oz) 59.4 kg (131 lb) Intake/Output Summary:  (Last 24 hours)    Intake/Output Summary (Last 24 hours) at 11/01/2023 1415  Last data filed at 11/01/2023 0824  Gross per 24 hour   Intake 1543 ml  Output 1315 ml   Net 228 ml        Last Bowel Movement Date: 10/30/23    Labs--reviewed  Results for orders placed or performed during the hospital encounter of 10/28/23 (from the past 24 hours)   POC GLUCOSE    Collection Time: 10/31/23  4:10 PM   # # Low-High    Glucose, POC 265 (H) 70 - 100 mg/dL   POC GLUCOSE    Collection Time: 10/31/23  8:33 PM   # # Low-High    Glucose, POC 229 (H) 70 - 100 mg/dL   POC GLUCOSE    Collection Time: 11/01/23  6:02 AM   # # Low-High    Glucose, POC 167 (H) 70 - 100 mg/dL   CBC CELLULAR THERAPEUTICS    Collection Time: 11/01/23  6:24 AM   # # Sharleen    White Blood Cells 7.50 4.50 - 11.00 10*3/uL    Red Blood Cells 4.17 (L) 4.40 - 5.50 10*6/uL    Hemoglobin 12.9 (L) 13.5 - 16.5 g/dL    Hematocrit 64.1 (L) 40.0 - 50.0 %    MCV 85.8 80.0 - 100.0 fL    MCH 30.8 26.0 - 34.0 pg    MCHC 35.9 32.0 - 36.0 g/dL    RDW 87.1 88.9 - 84.9 %    Platelet Count 285 150 - 400 10*3/uL    MPV 8.3 7.0 - 11.0 fL   BASIC METABOLIC PANEL CELLULAR THERAPEUTICS    Collection Time: 11/01/23  6:25 AM   # # Low-High    Sodium 136 (L) 137 - 147 mmol/L    Potassium 4.7 3.5 - 5.1 mmol/L    Chloride 100 98 - 110 mmol/L    Glucose 150 (H) 70 - 100 mg/dL    Blood Urea Nitrogen 16 7 - 25 mg/dL    Creatinine 9.29 9.59 - 1.24 mg/dL    Calcium 9.2 8.5 - 89.3 mg/dL    CO2 26 21 - 30 mmol/L    Anion Gap 10 3 - 12    Glomerular Filtration Rate (GFR) >60 >60 mL/min   POC GLUCOSE    Collection Time: 11/01/23 12:02 PM   # # Low-High    Glucose, POC 231 (H) 70 - 100 mg/dL         Physical Exam  General: no distress sitting in bedside wheelchair  HEENT: oral mucosa moist   Heart: Extremities warm and well perfused  Lungs: Normal work of breathing on room air  Abdomen: Soft, non-distended  Extremities: Peripheral edema is not present  Psych: mood and affect seemingly congruent  Neuro: Alert, interactive, responds to simple questions however hypophonic and dysarthric. Left facial paralysis, bilateral horizontal gaze palsy appreciated. Bilateral upper extremity at least antigravity shoulder abduction, 5/5 ADF.       Labs & Therapy Notes Reviewed    Plan discussed with attending, Dr. Laurance Corean CHRISTELLA Andria, DO   Physical Medicine & Rehabilitation  For patient care questions contact Rehab Medicine 3 FIRST CALL via Voalte.

## 2023-11-01 NOTE — Progress Notes
 PHYSICAL THERAPY       11/01/23 1330   Type of Note   Type of Note Daily   Time Calculation   Start Time 1330   Stop Time 1430   Time Calculation 60   $$ Gait / Mobility 3 units    $$ PT Therapeutic Activity 1 unit   Subjective   Subjective Pt sleeping in bed at point of contact, agreeable to participate in PT session.   Precautions   Comments L sided weakness, visual impairment (horizontal gaze palsy), PEG   Lying to Sitting on Side of Bed   Patient Needs Assistance With Extra time to complete the activity;Trunk   Assist Level Partial/moderate assistance   Equipment With rails;HOB elevated   Sit to Stand   Patient Needs Assistance With Trunk;Facilitation of hip extension;Facilitation of trunk;Facilitation of weight shift;Balance   Assist Level Substantial/maximal assistance   Equipment Hand hold assist   Stand to Sit   Patient Needs Assistance With Trunk;Facilitation of weight shift;Balance   Assist Level Substantial/maximal assistance   Equipment Hand hold assist   Stand Pivot Transfer   Patient Needs Assistance With Extra time to complete activity;Financial planner;Facilitation of hip flexion;Facilitation of trunk;Facilitation of weight shift;Cues for positioning of feet;Cues for positioning of hands;Cues for sequencing;Balance;Assist of two   Assist Level Dependent  (MaxA of PT, CGA of tech for safety)   Equipment Hand hold assist   Comments bed > w/c   Gait   Distance Distance 1;Distance 2   Gait Distance 1   Distance 3   Patient Needs Assistance With Facilitation of trunk;Cues for positioning of hands;Cues for step length;Balance   Assist Level Dependent   Equipment Hand hold assist;Wheelchair follow   Patterns Decreased gait velocity;Narrow base of support;Shuffling;Trunk lean forward   Deviation Left Decreased hip extension;Decreased knee flexion in swing;Decreased stride length;No heel strike/foot flat   Deviation Right Decreased hip extension;Decreased knee flexion in swing;Decreased stride length;No heel strike/foot flat   Activity Limited By Complaint of fatigue;Weakness   Comments Max cueing for posture and step length   Gait Distance 2   Distance 75 x 6   Patient Needs Assistance With Extra time to complete the activity;Facilitation of weight shift;Facilitation of trunk;Cues for sequencing;Cues for step length;Balance   Assist Level Dependent   Equipment Specialty walker with harness ([place]ifton)   Patterns Decreased gait velocity;Shuffling;Trunk lean forward;Variable step length   Deviation Left Decreased hip extension;Decreased knee flexion in swing;Decreased stride length;No heel strike/foot flat   Deviation Right Decreased hip extension;Decreased knee flexion in swing;Decreased stride length;No heel strike/foot flat   Activity Limited By Complaint of fatigue   Therapeutic Interventions   Therapeutic Activities Pt with wet cough noted during session, provided suctioning. Sitting reaching playing connect four game following ambulation.   Assessment   Assessment Pt tolerated rifton well. Pt left in chair at end of session with needs met.   Plan   PT Plan gait training (RW with wheelchair follow vs consider vector to help with posture vs rifton), neurogym (consider using mirror for visual feedback), standing/reaching activities, trial stairs as appropriate, nustep for conditioning, outcome measures as appropriate     Roza Creamer, PT, DPT

## 2023-11-01 NOTE — Progress Notes
 OCCUPATIONAL THERAPY  NOTE   Name: Kevin Obrien   MRN: [id number]     DOB: 29-Dec-1950      Age: 73 y.o.  Admission Date: 10/28/2023     LOS: 4 days     Date of Service: 11/01/2023         11/01/23 0840   Type of Note   Type of Note Daily   Time Calculation   Start Time 0840   Stop Time 0910   Time Calculation 30   $$ Phys ADL Skills 2 units   Subjective   Subjective Pt seated in bed on arrival with RN present for straight cathing. Pt agreeable to OT with encouragement. Pt seated in wheelchair on exit with alarm on and needs met/in reach   Pain   Pain Scale Verbal Description Scale   Verbal Description Scale   Verbal Pain Description Other (Comment)  (holds hand to head - does not state pain level or description when asked)   Location of Pain Head   Pain Intervention(s) Declines  (Pt does not want intervention at this time)   Pain Sedation Scale (POSS) (only if not using RASS) 2   Additional Pain Sites No   Additional Pain Scales? No   Precautions   Comments L sided weakness, visual impairment (horizontal gaze palsy), PEG   Oral Hygiene   Patient Needs Assistance With Balance;Rinsing mouth   Assist Level Partial/moderate assistance   Position Sitting in chair   Comments increased time to complete. verbal cues throughout for safety. pt leaning heavily on counter with BUE while seated in wheelchair   Lying to Sitting on Side of Bed   Patient Needs Assistance With Extra time to complete the activity;Trunk   Assist Level Partial/moderate assistance   Equipment With rails;HOB elevated   Comments MinA - requesting to complete on own. Assist for trunk to complete   Sit to and from Stand   Sit to and from Stand Sit to stand;Stand to sit   Sit to Stand   Patient Needs Assistance With Trunk;Extra time to complete activity;Blocking of left lower extremity;Blocking of right lower extremity;Facilitation of trunk;Facilitation of weight shift;Balance;Assist of two   Assist Level Dependent   Equipment Non powered stand aid Jayson Ip)   Comments Mod of OT; Min to [employer] of tech - nearing Ax1   Stand to Sit   Patient Needs Assistance With Trunk;Facilitation of weight shift;Balance   Assist Level Substantial/maximal assistance   Equipment Non powered stand aid Jayson Ip)   Assessment   Assessment Patient completes ADL task of brushing teeth with increased time and verbal cues while seated in wheelchair. Continues to required MinA fro balance and trunk control as patient heavily leans forward when seated and while brushing teeth. Continue progressing towards goals.   Plan   OT Plan LUE NMR, vision assessment (5/23), dynamic sitting balance, progress transfers, standing balance, standing for ADLs as able, all ADLs   Recommendations   OT Discharge Recommendations Consistent supervision;Home setting with home health;Versus;Skilled nursing facility   OT Equipment Recommendations Tub transfer bench         Therapist: Phyliss Quale, OTD, OTR/L  Date: 11/01/2023

## 2023-11-02 LAB — BASIC METABOLIC PANEL CELLULAR THERAPEUTICS
~~LOC~~ BKR ANION GAP: 10 10*6/uL — ABNORMAL LOW (ref 3–12)
~~LOC~~ BKR BLD UREA NITROGEN: 21 mg/dL (ref 7–25)
~~LOC~~ BKR CALCIUM: 9.3 mg/dL — ABNORMAL HIGH (ref 8.5–10.6)
~~LOC~~ BKR CHLORIDE: 99 mmol/L — ABNORMAL LOW (ref 98–110)
~~LOC~~ BKR CO2: 26 mmol/L — ABNORMAL HIGH (ref 21–30)
~~LOC~~ BKR CREATININE: 0.7 mg/dL — ABNORMAL HIGH (ref 0.40–1.24)
~~LOC~~ BKR GLOMERULAR FILTRATION RATE (GFR): >60 mL/min — ABNORMAL LOW (ref >60–5.1)

## 2023-11-02 LAB — POC GLUCOSE
~~LOC~~ BKR POC GLUCOSE: 219 mg/dL — ABNORMAL HIGH (ref 70–100)
~~LOC~~ BKR POC GLUCOSE: 137 mg/dL — ABNORMAL HIGH (ref 70–100)
~~LOC~~ BKR POC GLUCOSE: 180 mg/dL — ABNORMAL HIGH (ref 70–100)
~~LOC~~ BKR POC GLUCOSE: 195 mg/dL — ABNORMAL HIGH (ref 70–100)
~~LOC~~ BKR POC GLUCOSE: 213 mg/dL — ABNORMAL HIGH (ref 70–100)

## 2023-11-02 LAB — CBC CELLULAR THERAPEUTICS: ~~LOC~~ BKR MCH: 29 pg — ABNORMAL HIGH (ref 26.0–34.0)

## 2023-11-02 MED ORDER — SENNOSIDES 8.6 MG PO TAB
2 | Freq: Every evening | GASTROSTOMY | 0 refills | Status: DC
Start: 2023-11-02 — End: 2023-11-15
  Administered 2023-11-03 – 2023-11-14 (×12): 2 via GASTROSTOMY

## 2023-11-02 MED ORDER — POLYETHYLENE GLYCOL 3350 17 GRAM PO PWPK
1 | Freq: Every day | GASTROSTOMY | 0 refills | Status: DC
Start: 2023-11-02 — End: 2023-11-15
  Administered 2023-11-02 – 2023-11-15 (×9): 17 g via GASTROSTOMY

## 2023-11-02 NOTE — Progress Notes
 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  male admitted to [facility] on 10/28/2023 with the following issues: stroke      Rehabilitation Plan  Tentative discharge date: 11/19/2023  Rehabilitation: Patient will continue with comprehensive therapies including physical therapy, occupational therapy, speech & language pathology, specialized rehab nursing, neuropsychology and physiatry oversight.    Goals:  Patient Will Perform Basic Cares With: Partial/moderate assistance, Caregiver assist of one  Patient Will Perform Household Mobility With: Partial/moderate assistance, At wheelchair level  Recommended therapy after discharge: Consistent supervision, Home setting with home health, Versus, Skilled nursing facility  PT recommended equipment: Too early to determine (likely RW and wheelchair Web designer))  OT recommended equipment: Tub transfer bench    Daily Functional Update:  Stand to Sit Assist Level: Substantial/maximal assistance (11/01/2023  2:50 PM)  Equipment: Hand hold assist (11/01/2023  2:50 PM)     Stand Pivot Assist Level: Dependent (11/01/2023  2:50 PM)  Equipment: Hand hold assist (11/01/2023  2:50 PM)     Slideboard No data recorded  No data recorded   Gait Distance: 3 (11/01/2023  1:30 PM)    Assist Level: Dependent (11/01/2023  1:30 PM)    Equipment: Hand hold assist; Wheelchair follow (11/01/2023  1:30 PM)     Wheelchair No data recorded  No data recorded   Toileting       Assist Level: Dependent (10/31/2023  1:00 PM)  Equipment: Grab bar (10/30/2023  2:00 PM)     Toilet Transfer No data recorded   Upper Body Dressing Assist Level: Substantial/maximal assistance (10/29/2023  1:00 PM)     Lower Body Dressing Assist Level: Substantial/maximal assistance (10/29/2023  1:00 PM)       Diet: PO: Ice chips only (10/29/2023  9:15 AM)      Orientation Log & Cognitive Log Scores for the past 72 hrs (Last 3 readings):   Orientation Log Score (Out of 30)   11/01/23 0830 20/30   10/31/23 0845 23/30           Current Medical Problems/Risks of Medical Complications/Management    Acute Right Pontomedullary Ischemic Stroke  Proximal Right Intracranial Vertebral Artery Occlusion  Diffuse Intracranial Atherosclerosis  Bilateral Horizontal  Gaze Palsy  History of Forced Left Gaze Deviation  Left Facial Palsy  Impairments: left hemiparesis, expressive aphasia, oropharyngeal dysphagia, right facial weakness, left facial droop with dysarthria, diplopia, concern for attention/cognitive impairments, LUE ataxia, neurogenic bowel, neurogenic bladder  Impaired mobility and ADL, impaired swallow and cognition/communication               > consult PT/OT/SLP and neuropsychology to evaluate and treat               > consider discussion with patient/family regarding the use of modafinil if daytime fatigue becomes an ongoing issue               > Sleep optimization: melatonin 5mg  qhs, previously on seroquel nightly               > follow up: Neurology, PM&R, Cardiology     Increased Somnolence - Resolved  - Concern for increased somnolence 5/20 leading to Neurology Consult- rec CTA H&N; discussed with team may attempt to liberalize BP I.e. decreasing amlodipine to 5mg  due to symptoms possibly 2/2 to hypoperfusion  - CTA Head and Neck - negative for acute change  - Infectious workup including UA, CXR- negative for acute concerns  > appreciate further neurology recommendations- if acute focal weakness, numbness, coma, etc. Stroke activate/send to ED as he is high risk to have progression of his stroke/basilar involvement. Signed off.      Secondary Stroke Risk Optimization  > Antiplatelet/Anticoagulation: DAPT with aspirin 81mg  daily and clopidogrel 75mg  daily for 90 days followed by aspirin 81mg  indefinitely  > Blood Pressure Goal: <160 during acute hospitalization, long term goal of < 130/80               - Current antihypertensive medications: amlodipine 5mg  daily, lisinopril 20mg  daily  > Lipids: LDL is 161, with goal of <70: rosuvastatin 40mg  daily (per Cardiology recs)  > Blood Glucose: Hgb A1c is 11.7%, with goal of <7  > Tobacco abstinence goal     At risk for Spasticity               > consider trial of baclofen if indicated, discuss potential benefits and side effects               > daily ROM with therapy      Dysphagia s/p PEG tube placement  Moderate Malnutrition  - PEG tube placement 5/15               > Dietician consult, appreciate tube feed recommendations- transition from continuous to bolus feeds 5/19               Recommend Jevity 1.5 bolus feeds, 5 cartons/day + 1 prosource pkt.     Recommend 1/2 carton per bolus for first 1-2 feeds, then proceed to goal regimen.    This will provide 1857kcal, 96g protein, free H2O.    Recommend 30mL free H2O flush before and after each bolus + 100mL q4h to meet fluid needs.   To help with loose stools, before interrupting EN support:    Avoid sorbitol containing medications such as liquid tylenol as these can make diarrhea worse; recommend switching to crushable tablet form.    Consider probiotic.    If loose stools persist, recommend adding 1-2 packets of nutrisource fiber per day.      Aspiration Pneumonia  S/p AHRF  - increased O2 req overnight 5/13 with elevated procal, tachycardia, leukocytosis  - CXR 5/13  notable for mild basal opacities, favored to represent atelectasis  - s/p vanc x1 and zosyn x3 days  - sputum cx NGTD               > Augmentin to complete total 7 day course of antibiotics through 5/20 AM     Left Bell's Palsy               > Alternate eye patches; eyepatch for the L eye overnight, artificial tears TID daily in the setting of left facial palsy     Pseudobulbar Affect  > Per Neurology: Consider starting Dextromethorphan-Quinidine in the outpatient setting (unfortunately unavailble on the formulary inpatient) for Pseudobulbar affect, will start Fluoxetine 20mg  in the meantime --> instead was started on Sertraline 50mg , will continue while in rehab  > Neurology clinic follow up on dc     Oral Thrush               > oral care TID   > Nystatin swish and spit started 5/20, ok'd by SLP     Bowel Management  At risk for Neurogenic Bowel  Loose stools (resolved)  Constipation  -Last Bowel Movement Date: 10/30/23 (11/01/2023  8:24 AM)  - Goal for a daily BM or every 24-48 hours.   - c diff negative 5/18  > adjust bowel regimen PRN   > loperamide PRN  > Senna 2 tabs QHS via g tube  > MiraLax daily via g tube     Bladder Management  Neurogenic Bladder with Retention       -Post-Void (PVR) Bladder Scan (mL): 451 milliliters (10/29/2023  6:13 PM)  -Straight Cath (mL): 375 (11/02/2023 10:00 AM)  > Monitor urinary retention per unit protocol. If unable to void or PVRs >300cc perform clean ISC (intermittent straight catheterization) for volumes > . Stop checking BVIs if 3 consecutive volumes <150cc.   > Consider foley placement  >  doxazosin 2mg , increased 5/23     Hypertension  Orthostatic Hypotension  LVOT Obstruction  PFO  CAD s/p PCI               > metoprolol 25mg  BID               > amlodipine 10mg  daily decreased to 5mg  5/20, lisinopril 20mg  daily               > 30 day MCOT    > compression stockings to prevent sx orthostatic hypotension               > Follow up with outpatient Cardiology      Diabetes Mellitus               > Endocrinology consult for ongoing discharge regimen planning     Depression/Mood screen :          > neuropsychology to evaluate and treat for adjustment to new deficits          > Encouragement and support    Nutrition:   Body mass index is 21.8 kg/m?SABRA  Malnutrition Details:  Malnutrition present on admission  ICD-10 code E44: Chronic illness/Moderate non-severe malnutrition      Mild loss of muscle mass, Mild loss of body fat, Energy intake: Less than 75% of estimated energy requirement for 1 month or more        Loss of Subcutaneous Fat: Yes Mild Orbital, Triceps  Muscle Wasting: Yes Moderate Clavicle, Temple, Deltoid  Malnutrition Interventions: EN recs to meet 100% estimated kcal and protein needs    Wounds:        DVT ppx: enoxaparin (LOVENOX) syringe 40 mg  QDAY(21); patient will not require at the time of discharge    Code Status: Full Code    Current Medications:  Scheduled Meds:amLODIPine (NORVASC) tablet 5 mg, 5 mg, Per NG tube, QDAY  artificial tears (PF) single dose ophthalmic solution 1 drop, 1 drop, Left Eye, TID  aspirin chewable tablet 81 mg, 81 mg, Per NG tube, QDAY  clopiDOGreL (PLAVIX) tablet 75 mg, 75 mg, Per NG tube, QDAY  Diet Enteral Feeding Bolus, , SEE ADMIN INSTRUCTIONS, QID (3,87,83,79)   And  Water Bolus, 100 mL, PEG Tube, QID (3,87,83,79)  doxazosin (CARDURA) tablet 2 mg, 2 mg, Feeding Tube, QHS  enoxaparin (LOVENOX) syringe 40 mg, 40 mg, Subcutaneous, QDAY(21)  insulin aspart (U-100) (NOVOLOG FLEXPEN U-100 INSULIN) injection PEN 0-12 Units, 0-12 Units, Subcutaneous, QID  insulin aspart (U-100) (NOVOLOG FLEXPEN U-100 INSULIN) injection PEN 14 Units, 14 Units, Subcutaneous, QID  insulin glargine (LANTUS SOLOSTAR U-100 INSULIN) injection PEN 18 Units, 18 Units, Subcutaneous, QDAY  lisinopriL (ZESTRIL) tablet 20 mg, 20 mg, Feeding Tube, QDAY  melatonin tablet 5 mg, 5 mg, Feeding Tube, QHS  metoprolol tartrate tablet 25 mg, 25 mg, Feeding Tube, BID  nystatin (MYCOSTATIN) oral suspension 500,000 Units, 500,000 Units, Swish & Spit, QID  petrolatum (STYE) ophthalmic ointment 0.25 inch, 0.25 inch, Both Eyes, TID  rosuvastatin (CRESTOR) tablet 40 mg, 40 mg, Feeding Tube, QDAY  sertraline (ZOLOFT) tablet 50 mg, 50 mg, Feeding Tube, QDAY  thiamine mononitrate (vit B1) tablet 100 mg, 100 mg, Per NG tube, QDAY    Continuous Infusions:  PRN and Respiratory Meds:acetaminophen Q8H PRN, bisacodyL QDAY PRN, dextrose 50% (D50) IV PRN, diclofenac sodium QID PRN, lidocaine hcl PRN, loperamide TID PRN, pancrelipase 20,880 Units/sodium bicarbonate 650 mg (Colman CLOG DESTROYER) PRN (On Call from Rx), phenoL PRN      Subjective     NAEON. No acute complaints or concerns in morning. Reports good sleep. Pain well controlled with multimodal regimen. Tolerating therapy activities well. Last BM 05/21, will start senna and MiraLax via gastrostomy tube today. VS WNL, labs stable trend. Noted endo assessment yesterday, appreciate recs.      Objective                          Vital Signs: Last Filed                 Vital Signs: 24 Hour Range   BP: 156/56 (05/24 0836)  Temp: 36.4 ?C (97.5 ?F) (05/24 0300)  Pulse: 81 (05/24 0836)  Respirations: 18 PER MINUTE (05/24 0300)  SpO2: 94 % (05/24 0300)  O2 Device: None (Room air) (05/24 0300) BP: (121-156)/(50-60)   Temp:  [36.4 ?C (97.5 ?F)-36.7 ?C (98.1 ?F)]   Pulse:  [64-81]   Respirations:  [16 PER MINUTE-18 PER MINUTE]   SpO2:  [94 %-100 %]   O2 Device: None (Room air)     Vitals:    10/28/23 1324 10/30/23 1511   Weight: 59.7 kg (131 lb 9.8 oz) 59.4 kg (131 lb)       Intake/Output Summary:  (Last 24 hours)    Intake/Output Summary (Last 24 hours) at 11/02/2023 1105  Last data filed at 11/02/2023 1000  Gross per 24 hour   Intake 120 ml   Output 2305 ml   Net -2185  ml        Last Bowel Movement Date: 10/30/23    Labs--reviewed  Results for orders placed or performed during the hospital encounter of 10/28/23 (from the past 24 hours)   POC GLUCOSE    Collection Time: 11/01/23 12:02 PM   # # Low-High    Glucose, POC 231 (H) 70 - 100 mg/dL   POC GLUCOSE    Collection Time: 11/01/23  4:25 PM   # # Low-High    Glucose, POC 252 (H) 70 - 100 mg/dL   POC GLUCOSE    Collection Time: 11/01/23  9:24 PM   # # Low-High    Glucose, POC 219 (H) 70 - 100 mg/dL   POC GLUCOSE    Collection Time: 11/02/23  2:57 AM   # # Low-High    Glucose, POC 180 (H) 70 - 100 mg/dL   POC GLUCOSE    Collection Time: 11/02/23  6:06 AM   # # Low-High    Glucose, POC 137 (H) 70 - 100 mg/dL   CBC CELLULAR THERAPEUTICS    Collection Time: 11/02/23  6:28 AM   # # Sharleen    White Blood Cells 7.90 4.50 - 11.00 10*3/uL    Red Blood Cells 4.30 (L) 4.40 - 5.50 10*6/uL    Hemoglobin 12.8 (L) 13.5 - 16.5 g/dL    Hematocrit 63.3 (L) 40.0 - 50.0 %    MCV 85.1 80.0 - 100.0 fL    MCH 29.8 26.0 - 34.0 pg    MCHC 35.0 32.0 - 36.0 g/dL    RDW 87.2 88.9 - 84.9 %    Platelet Count 282 150 - 400 10*3/uL    MPV 9.0 7.0 - 11.0 fL   BASIC METABOLIC PANEL CELLULAR THERAPEUTICS    Collection Time: 11/02/23  6:28 AM   # # Sharleen    Sodium 135 (L) 137 - 147 mmol/L    Potassium 4.5 3.5 - 5.1 mmol/L    Chloride 99 98 - 110 mmol/L    Glucose 153 (H) 70 - 100 mg/dL    Blood Urea Nitrogen 21 7 - 25 mg/dL    Creatinine 9.23 9.59 - 1.24 mg/dL    Calcium 9.3 8.5 - 89.3 mg/dL    CO2 26 21 - 30 mmol/L    Anion Gap 10 3 - 12    Glomerular Filtration Rate (GFR) >60 >60 mL/min         Physical Exam  General: no distress, resting comfortably in bed  HEENT: oral mucosa moist   Heart: Extremities warm and well perfused  Lungs: Normal work of breathing on room air  Abdomen: Soft, non-tender, non-distended, gastrostomy tube in situ with healthy base skin and without leaks or erythema.  Extremities: Peripheral edema is not present  Psych: mood and affect seemingly congruent  Neuro: Alert, interactive, responds to simple questions however hypophonic and dysarthric. Left facial paralysis, bilateral horizontal gaze palsy appreciated. Bilateral upper extremity at least antigravity shoulder abduction, 5/5 ADF.       Labs & Therapy Notes Reviewed    Ladonna Idell LULLA Orlan, MD   Physical Medicine & Rehabilitation  For patient care questions contact Rehab Medicine 3 FIRST CALL via Voalte.

## 2023-11-02 NOTE — Progress Notes
 Brief Endocrine Note    Patient chart reviewed and not physically seen today. Glucose values reviewed as below:     Glucose, POC   Date/Time Value Ref Range Status   11/02/2023 1128 195 (H) 70 - 100 mg/dL Final   94/75/7974 9393 137 (H) 70 - 100 mg/dL Final   94/75/7974 9742 180 (H) 70 - 100 mg/dL Final   94/76/7974 7875 219 (H) 70 - 100 mg/dL Final   94/76/7974 8374 252 (H) 70 - 100 mg/dL Final   94/76/7974 8797 231 (H) 70 - 100 mg/dL Final   94/76/7974 9397 167 (H) 70 - 100 mg/dL Final   94/77/7974 7966 229 (H) 70 - 100 mg/dL Final   93/94/7977 7993 158 (H) 70 - 100 MG/DL Final   93/94/7977 8081 257 (H) 70 - 100 MG/DL Final   93/94/7977 8242 397 (H) 70 - 100 MG/DL Final   89/88/7978 8567 105 (H) 70 - 100 MG/DL Final   90/85/7979 8280 129 (H) 70 - 100 MG/DL Final   90/85/7979 9340 126 (H) 70 - 100 MG/DL Final   90/85/7979 9655 182 (H) 70 - 100 MG/DL Final   88/78/7980 9048 266  Final       Recommendations:   - Continue current insulin plan    Will continue to follow.     Omega Blunt, MD  Endocrinology, Metabolism, Clinical Pharmacology   Pager: [phone]  Available on Voalte

## 2023-11-03 LAB — POC GLUCOSE
~~LOC~~ BKR POC GLUCOSE: 157 mg/dL — ABNORMAL HIGH (ref 70–100)
~~LOC~~ BKR POC GLUCOSE: 130 mg/dL — ABNORMAL HIGH (ref 70–100)
~~LOC~~ BKR POC GLUCOSE: 182 mg/dL — ABNORMAL HIGH (ref 70–100)
~~LOC~~ BKR POC GLUCOSE: 186 mg/dL — ABNORMAL HIGH (ref 70–100)
~~LOC~~ BKR POC GLUCOSE: 195 mg/dL — ABNORMAL HIGH (ref 70–100)

## 2023-11-03 MED ORDER — DICLOFENAC SODIUM 1 % TP GEL
2 g | Freq: Four times a day (QID) | TOPICAL | 0 refills | Status: DC
Start: 2023-11-03 — End: 2023-11-22
  Administered 2023-11-03: 17:00:00 2 g via TOPICAL

## 2023-11-03 MED ORDER — CARBAMIDE PEROXIDE 6.5 % OT DROP
5 [drp] | Freq: Two times a day (BID) | OTIC | 0 refills | Status: DC
Start: 2023-11-03 — End: 2023-11-11
  Administered 2023-11-03: 17:00:00 5 [drp] via OTIC

## 2023-11-03 MED ORDER — LIDOCAINE 5 % TP PTMD
1 | Freq: Every day | TOPICAL | 0 refills | Status: DC
Start: 2023-11-03 — End: 2023-11-22
  Administered 2023-11-03 – 2023-11-16 (×10): 1 via TOPICAL

## 2023-11-03 MED ORDER — ACETAMINOPHEN 325 MG PO TAB
650 mg | GASTROSTOMY | 0 refills | Status: DC
Start: 2023-11-03 — End: 2023-11-22
  Administered 2023-11-03 – 2023-11-21 (×64): 650 mg via GASTROSTOMY

## 2023-11-03 NOTE — Progress Notes
 PHYSICAL THERAPY       11/03/23 1400   Time Calculation   $$ Professional Contact 1 unit    Subjective   Subjective Attempted to see pt for PT session, however, pt continuing to endorse significant fatigue and not feeling well, refusing all mobility. Pt endorsing dizziness, refuses BP measurement. Pt states yes when asked if he thought dizziness was vision related. Per Dr. Andria, 30 min missed from earlier session is medically excused.     Palmer Fahrner, PT, DPT

## 2023-11-03 NOTE — Progress Notes
 SPEECH-LANGUAGE PATHOLOGY     11/03/23 1500   Behavior   Comments* Pt resting in bed upon SLP arrival. Pt agreeable to complete make-up minutes from earlier this date. Pt participated with encouragement from SLP and family throughout. All needs met at end of session.   Attention Goals   Therapy Activities Sustained attention   Therapy Activity Comments Targeted sustained attention with card sorting activity. Pt was asked to sort cards into 6 different piles based on color. Pt required max cues throughout including examples, repetition of task instructions, and leading questions.     Assessment Pt is progressing towards functional therapy goals.    Plan Cognition:  -- O-log  -- Simple attention/memory  -- Visual scanning  -- Medication management  -- Finance management     Dysphagia:  -- PO trials thin/mildly thick liquids, pureed solids  -- EMST-Lite  -- Swallow exercises as able  -- IOPI  -- VSS next week    Kevin Sensor, MS, CCC-SLP     30 minutes made-up from missed time earlier this AM on 11/03/2023

## 2023-11-03 NOTE — Progress Notes
 SPEECH-LANGUAGE PATHOLOGY     11/03/23 0930   Behavior   Comments* Pt resting in bed with family and RN present at time of SLP arrival. Per RN and pt's family, pt has been reporting jaw/ear pain this AM that has been causing pt discomfort and lack of sleep previous night. Pt initially agreeable to participate, but reported pain/discomfort halfway through session and requested to stop. RN and provider notified.   Dysphagia Goals   Therapy Activities PO trials   PO Trials Ice chips;Thin liquids   Therapy Activity Comments Oral care completed. Pt agreeable to x1 ice chip trial but then declines further ice chip trials 2/2 jaw pain. Pt agreeable to thin liquid trials via tsp. Approx 3 trials were provided before pt requested to stop po trials this date. Anterior bolus spillage intermittently present with thin liquid trials. Multiple swallows also observed with thin liquid trials.   Orientation Goals   Therapy Activities Orientation log score   Orientation Log Score (Out of 30) 19/30   Therapy Activity Comments -1 point from previous administration. Pt unable to state date despite cues. Pt able to state the DOW and clock time with multiple choice cues. Pt required multiple choice cues to also state etiology and pathology.   Attention Goals   Therapy Activities Sustained attention   Therapy Activity Comments Targeted sustained attention with parquetry blocks. Pt required max cues/increased time to complete activities. Suspect pt's pain/lethargy affecting pt's overall performance on task.   Missed Treatment   Missed Minutes 30   Reasons for Missed Minutes Pain     Assessment Suspect pt's pain and lethargy affecting pt's overall performance this date.    Plan Cognition:  -- O-log  -- Simple attention/memory  -- Visual scanning  -- Medication management  -- Finance management     Dysphagia:  -- PO trials thin/mildly thick liquids, pureed solids  -- EMST-Lite  -- Swallow exercises as able  -- IOPI  -- VSS next week    Amadeo Sensor, MS, CCC-SLP

## 2023-11-03 NOTE — Progress Notes
 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  male admitted to [facility] on 10/28/2023 with the following issues: stroke      Rehabilitation Plan  Tentative discharge date: 11/19/2023  Rehabilitation: Patient will continue with comprehensive therapies including physical therapy, occupational therapy, speech & language pathology, specialized rehab nursing, neuropsychology and physiatry oversight.    Goals:  Patient Will Perform Basic Cares With: Partial/moderate assistance, Caregiver assist of one  Patient Will Perform Household Mobility With: Partial/moderate assistance, At wheelchair level  Recommended therapy after discharge: Consistent supervision, Home setting with home health, Versus, Skilled nursing facility  PT recommended equipment: Too early to determine (likely RW and wheelchair Web designer))  OT recommended equipment: Tub transfer bench    Daily Functional Update:  Stand to Sit Assist Level: Substantial/maximal assistance (11/01/2023  2:50 PM)  Equipment: Hand hold assist (11/01/2023  2:50 PM)     Stand Pivot Assist Level: Dependent (11/01/2023  2:50 PM)  Equipment: Hand hold assist (11/01/2023  2:50 PM)     Slideboard No data recorded  No data recorded   Gait Distance: 3 (11/01/2023  1:30 PM)    Assist Level: Dependent (11/01/2023  1:30 PM)    Equipment: Hand hold assist; Wheelchair follow (11/01/2023  1:30 PM)     Wheelchair No data recorded  No data recorded   Toileting       Assist Level: Dependent (10/31/2023  1:00 PM)  Equipment: Grab bar (10/30/2023  2:00 PM)     Toilet Transfer No data recorded   Upper Body Dressing Assist Level: Substantial/maximal assistance (10/29/2023  1:00 PM)     Lower Body Dressing Assist Level: Substantial/maximal assistance (10/29/2023  1:00 PM)       Diet: PO: Ice chips only (10/29/2023  9:15 AM)      Orientation Log & Cognitive Log Scores for the past 72 hrs (Last 3 readings):   Orientation Log Score (Out of 30)   11/01/23 0830 20/30           Current Medical Problems/Risks of Medical Complications/Management    Acute Right Pontomedullary Ischemic Stroke  Proximal Right Intracranial Vertebral Artery Occlusion  Diffuse Intracranial Atherosclerosis  Bilateral Horizontal Gaze Palsy  History of  Forced Left Gaze Deviation  Left Facial Palsy  Impairments: left hemiparesis, expressive aphasia, oropharyngeal dysphagia, right facial weakness, left facial droop with dysarthria, diplopia, concern for attention/cognitive impairments, LUE ataxia, neurogenic bowel, neurogenic bladder  Impaired mobility and ADL, impaired swallow and cognition/communication               > consult PT/OT/SLP and neuropsychology to evaluate and treat               > consider discussion with patient/family regarding the use of modafinil if daytime fatigue becomes an ongoing issue               > Sleep optimization: melatonin 5mg  qhs, previously on seroquel nightly > follow up: Neurology, PM&R, Cardiology     Increased Somnolence - Resolved  - Concern for increased somnolence 5/20 leading to Neurology Consult- rec CTA H&N; discussed with team may attempt to liberalize BP I.e. decreasing amlodipine to 5mg  due to symptoms possibly 2/2 to hypoperfusion  - CTA Head and Neck - negative for acute change  - Infectious workup including UA, CXR- negative for acute concerns  > appreciate further neurology recommendations- if acute focal weakness, numbness, coma, etc. Stroke activate/send to ED as he is high risk to have progression of his stroke/basilar involvement. Signed off.      Secondary Stroke Risk Optimization  > Antiplatelet/Anticoagulation: DAPT with aspirin 81mg  daily and clopidogrel 75mg  daily for 90 days followed by aspirin 81mg  indefinitely  > Blood Pressure Goal: <160 during acute hospitalization, long term goal of < 130/80               - Current antihypertensive medications: amlodipine 5mg  daily, lisinopril 20mg  daily  > Lipids: LDL is 161, with goal of <70: rosuvastatin 40mg  daily (per Cardiology recs)  > Blood Glucose: Hgb A1c is 11.7%, with goal of <7  > Tobacco abstinence goal     At risk for Spasticity               > consider trial of baclofen if indicated, discuss potential benefits and side effects               > daily ROM with therapy      Dysphagia s/p PEG tube placement  Moderate Malnutrition  - PEG tube placement 5/15               > Dietician consult, appreciate tube feed recommendations- transition from continuous to bolus feeds 5/19               Recommend Jevity 1.5 bolus feeds, 5 cartons/day + 1 prosource pkt.     Recommend 1/2 carton per bolus for first 1-2 feeds, then proceed to goal regimen.    This will provide 1857kcal, 96g protein, free H2O.    Recommend 30mL free H2O flush before and after each bolus + 100mL q4h to meet fluid needs.   To help with loose stools, before interrupting EN support:    Avoid sorbitol containing medications such as liquid tylenol as these can make diarrhea worse; recommend switching to crushable tablet form.    Consider probiotic.    If loose stools persist, recommend adding 1-2 packets of nutrisource fiber per day.      Aspiration Pneumonia  S/p AHRF  - increased O2 req overnight 5/13 with elevated procal, tachycardia, leukocytosis  - CXR 5/13 notable for mild basal opacities, favored to represent atelectasis  - s/p vanc x1 and zosyn x3 days  -  sputum cx NGTD               > Augmentin to complete total 7 day course of antibiotics through 5/20 AM     Left Bell's Palsy               > Alternate eye patches; eyepatch for the L eye overnight, artificial tears TID daily in the setting of left facial palsy     Pseudobulbar Affect  > Per Neurology: Consider starting Dextromethorphan-Quinidine in the outpatient setting (unfortunately unavailble on the formulary inpatient) for Pseudobulbar affect, will start Fluoxetine 20mg  in the meantime --> instead was started on Sertraline 50mg , will continue while in rehab  > Neurology clinic follow up on dc     Oral Thrush               > oral care TID   > Nystatin swish and spit started 5/20, ok'd by SLP     Bowel Management  At risk for Neurogenic Bowel  Loose stools (resolved)  Constipation  -Last Bowel Movement Date: 11/03/23 (11/03/2023  9:20 AM)  - Goal for a daily BM or every 24-48 hours.   - c diff negative 5/18  > adjust bowel regimen PRN   > loperamide PRN  > Senna 2 tabs QHS via g tube  > MiraLax daily via g tube     Bladder Management  Neurogenic Bladder with Retention       -Post-Void (PVR) Bladder Scan (mL): 451 milliliters (10/29/2023  6:13 PM)  -Straight Cath (mL): 350 (11/03/2023  9:20 AM)  > Monitor urinary retention per unit protocol. If unable to void or PVRs >300cc perform clean ISC (intermittent straight catheterization) for volumes > . Stop checking BVIs if 3 consecutive volumes <150cc.   > Consider foley placement  >  doxazosin 2mg , increased 5/23 Hypertension  Orthostatic Hypotension  LVOT Obstruction  PFO  CAD s/p PCI               > metoprolol 25mg  BID               > amlodipine 10mg  daily decreased to 5mg  5/20, lisinopril 20mg  daily               > 30 day MCOT    > compression stockings to prevent sx orthostatic hypotension               > Follow up with outpatient Cardiology      Diabetes Mellitus               > Endocrinology consult for ongoing discharge regimen planning     Depression/Mood screen :          > neuropsychology to evaluate and treat for adjustment to new deficits          > Encouragement and support    Nutrition:   Body mass index is 21.8 kg/m?SABRA  Malnutrition Details:  Malnutrition present on admission  ICD-10 code E44: Chronic illness/Moderate non-severe malnutrition      Mild loss of muscle mass, Mild loss of body fat, Energy intake: Less than 75% of estimated energy requirement for 1 month or more        Loss of Subcutaneous Fat: Yes Mild Orbital, Triceps  Muscle Wasting: Yes Moderate Clavicle, Temple, Deltoid               Malnutrition Interventions: EN recs to meet 100% estimated kcal and protein needs  Wounds:        DVT ppx: enoxaparin (LOVENOX) syringe 40 mg  QDAY(21); patient will not require at the time of discharge    Code Status: Full Code    Current Medications:  Scheduled Meds:amLODIPine (NORVASC) tablet 5 mg, 5 mg, Per NG tube, QDAY  artificial tears (PF) single dose ophthalmic solution 1 drop, 1 drop, Left Eye, TID  aspirin chewable tablet 81 mg, 81 mg, Per NG tube, QDAY  clopiDOGreL (PLAVIX) tablet 75 mg, 75 mg, Per NG tube, QDAY  Diet Enteral Feeding Bolus, , SEE ADMIN INSTRUCTIONS, QID (3,87,83,79)   And  Water Bolus, 100 mL, PEG Tube, QID (3,87,83,79)  doxazosin (CARDURA) tablet 2 mg, 2 mg, Feeding Tube, QHS  enoxaparin (LOVENOX) syringe 40 mg, 40 mg, Subcutaneous, QDAY(21)  insulin aspart (U-100) (NOVOLOG FLEXPEN U-100 INSULIN) injection PEN 0-12 Units, 0-12 Units, Subcutaneous, QID  insulin aspart (U-100) (NOVOLOG FLEXPEN U-100 INSULIN) injection PEN 14 Units, 14 Units, Subcutaneous, QID  insulin glargine (LANTUS SOLOSTAR U-100 INSULIN) injection PEN 18 Units, 18 Units, Subcutaneous, QDAY  lisinopriL (ZESTRIL) tablet 20 mg, 20 mg, Feeding Tube, QDAY  melatonin tablet 5 mg, 5 mg, Feeding Tube, QHS  metoprolol tartrate tablet 25 mg, 25 mg, Feeding Tube, BID  nystatin (MYCOSTATIN) oral suspension 500,000 Units, 500,000 Units, Swish & Spit, QID  petrolatum (STYE) ophthalmic ointment 0.25 inch, 0.25 inch, Both Eyes, TID  polyethylene glycol 3350 (MIRALAX) packet 17 g, 1 packet, Per G Tube, QDAY  rosuvastatin (CRESTOR) tablet 40 mg, 40 mg, Feeding Tube, QDAY  senna (SENOKOT) tablet 2 tablet, 2 tablet, Per G Tube, QHS  sertraline (ZOLOFT) tablet 50 mg, 50 mg, Feeding Tube, QDAY  thiamine mononitrate (vit B1) tablet 100 mg, 100 mg, Per NG tube, QDAY    Continuous Infusions:  PRN and Respiratory Meds:acetaminophen Q8H PRN, bisacodyL QDAY PRN, dextrose 50% (D50) IV PRN, diclofenac sodium QID PRN, lidocaine hcl PRN, loperamide TID PRN, pancrelipase 20,880 Units/sodium bicarbonate 650 mg ([place] CLOG DESTROYER) PRN (On Call from Rx), phenoL PRN      Subjective     NAEON, but therapy reports an episode of aggression during session this morning. This may be due to left jaw pain per conversation with patient, tender on left TMJ. Will start topical diclofenac and schedule acetaminophen 650 mg q6H via feeding tube. Patient also requests help with ear wax removal, will order Debrox. BM this morning. VS WNL, no new labs for review this morning. Noted endo assessment yesterday, appreciate recs.      Objective                          Vital Signs: Last Filed                 Vital Signs: 24 Hour Range   BP: 134/55 (05/25 0906)  Temp: 36.9 ?C (98.5 ?F) (05/25 9093)  Pulse: 71 (05/25 0850)  Respirations: 18 PER MINUTE (05/25 0906)  SpO2: 96 % (05/25 0906)  O2 Device: None (Room air) (05/25 0906) BP: (111-134)/(51-60)   Temp:  [36.6 ?C (97.9 ?F)-36.9 ?C (98.5 ?F)]   Pulse:  [70-72]   Respirations:  [16 PER MINUTE-18 PER MINUTE]   SpO2:  [96 %-97 %]   O2 Device: None (Room air)     Vitals:    10/28/23 1324 10/30/23 1511   Weight: 59.7 kg (131 lb 9.8 oz) 59.4 kg (131 lb)       Intake/Output Summary:  (Last 24 hours)  Intake/Output Summary (Last 24 hours) at 11/03/2023 1017  Last data filed at 11/03/2023 0920  Gross per 24 hour   Intake 1844 ml   Output 1870 ml   Net -26 ml        Last Bowel Movement Date: 11/03/23    Labs--reviewed  Results for orders placed or performed during the hospital encounter of 10/28/23 (from the past 24 hours)   POC GLUCOSE    Collection Time: 11/02/23 11:28 AM   # # Low-High    Glucose, POC 195 (H) 70 - 100 mg/dL   POC GLUCOSE    Collection Time: 11/02/23  4:45 PM   # # Low-High    Glucose, POC 213 (H) 70 - 100 mg/dL   POC GLUCOSE    Collection Time: 11/02/23  9:51 PM   # # Low-High    Glucose, POC 157 (H) 70 - 100 mg/dL   POC GLUCOSE    Collection Time: 11/03/23  2:55 AM   # # Low-High    Glucose, POC 195 (H) 70 - 100 mg/dL   POC GLUCOSE    Collection Time: 11/03/23  6:02 AM   # # Low-High    Glucose, POC 130 (H) 70 - 100 mg/dL         Physical Exam  General: no distress, resting comfortably in bed  HEENT: oral mucosa moist   Heart: Extremities warm and well perfused  Lungs: Normal work of breathing on room air  Abdomen: Soft, non-tender, non-distended, gastrostomy tube in situ with healthy base skin and without leaks or erythema.  Extremities: Peripheral edema is not present  Psych: mood and affect seemingly congruent  Neuro: Alert, interactive, responds to simple questions however hypophonic and dysarthric. Left facial paralysis, bilateral horizontal gaze palsy appreciated. Bilateral upper extremity at least antigravity shoulder abduction, 5/5 ADF.       Labs & Therapy Notes Reviewed    Ladonna Idell LULLA Orlan, MD   Physical Medicine & Rehabilitation  For patient care questions contact Rehab Medicine 3 FIRST CALL via Voalte.

## 2023-11-03 NOTE — Progress Notes
 PHYSICAL THERAPY       11/03/23 1145   Time Calculation   $$ Professional Contact 1 unit    Subjective   Subjective Attempted to see pt for m/u session however, RN requiring extended time to perform cares. Will continue to attempt session throughout the day as able.     Yoshito Gaza, PT, DPT

## 2023-11-03 NOTE — Progress Notes
 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 Yes    Consult Type: Co-Management w/Signed Orders    Ordering Provider's or Responsible Teams's Phone/Pager number? Number not on file              Assessment / Plan     Admission date and time: 10/28/2023  1:05 PM     Type 2 Diabetes Mellitus uncontrolled  Non adherance to meds  Acute stroke   Dyslipidemia   Tube feed hyperglycemia   A1c 11.7, 10/17/2023   PTA regimen: non compliant, was supposed to be on meal time insulin. Was taking very sporadically   Was not taking any PO meds either   Hypoglycemic episodes on this regimen: none   Follows up with ? PCP for diabetes management  Diabetic-complications assessment:  CAD, stroke, neuropathy     Aspiration pneumonia    Current diet: Jevity 1.5 TF QID  Glucocorticoids: none     Diabetes Regimen Changes today based on glucose values:    Continue insulin glargine 18u every day   Continue Aspart 14 units with each bolus feed  Continue correction factor to QID with bolus feeds    Goal BG 140-180.     Diabetes Plans Post Discharge  Diabetes regimen: TBD  He will most likely need insulin on discharge.   We will request for outpatient follow-up. Family would like to come to [facility]. He used to see [facility] and last visit was in 2020    Subjective:      Patient seen at the bedside. No acute complaints. No nausea, vomiting, or abdominal pain. Discussed continuing to check blood sugar and adjust as needed.     Vital Signs: Last Filed In 24 Hours Vital Signs: 24 Hour Range   BP: 134/55 (05/25 0906)  Temp: 36.9 ?C (98.5 ?F) (05/25 9093)  Pulse: 71 (05/25 0850)  Respirations: 18 PER MINUTE (05/25 0906)  SpO2: 96 % (05/25 0906)  O2 Device: None (Room air) (05/25 0906) BP: (111-134)/(51-60)   Temp:  [36.6 ?C (97.9 ?F)-36.9 ?C (98.5 ?F)]   Pulse:  [70-72]   Respirations:  [16 PER MINUTE-18 PER MINUTE]   SpO2:  [96 %-97 %]   O2 Device: None (Room air)  Patient is awake  Breathing on room air.  No CP      Current Facility-Administered Medications:     acetaminophen (TYLENOL) tablet 650 mg, 650 mg, Feeding Tube, Q6H*, Orlan Ladonna Idell LULLA, MD    amLODIPine (NORVASC) tablet 5 mg, 5 mg, Per NG tube, QDAY, Fortune, Stephanie M, DO, 5 mg at 11/03/23 9070    artificial tears (PF) single dose ophthalmic solution 1 drop, 1 drop, Left Eye, TID, Fortune, Stephanie M, DO, 1 drop at 11/02/23 2136    aspirin chewable tablet 81 mg, 81 mg, Per NG tube, QDAY, Fortune, Stephanie M, DO, 81 mg at 11/03/23 9070    bisacodyL (DULCOLAX) rectal suppository 10 mg, 10 mg, Rectal, QDAY PRN, Fortune, Stephanie M, DO    carbamide peroxide (DEBROX) 6.5 % otic solution 5 drop, 5 drop, Both Ears, BID, Orlan Ladonna Idell LULLA, MD    clopiDOGreL (PLAVIX) tablet 75 mg, 75 mg, Per NG tube, QDAY, Fortune, Stephanie M, DO, 75 mg at 11/03/23 0930    dextrose 50% (D50) syringe 25-50 mL, 12.5-25 g, Intravenous, PRN, Fortune, Stephanie M, DO diclofenac sodium (VOLTAREN) 1 % topical gel 2 g, 2 g, Topical, QID, Orlan Ladonna Idell LULLA, MD    diclofenac sodium (VOLTAREN) 1 % topical gel 2 g, 2 g, Topical, QID PRN, Fortune, Stephanie M, DO    Diet Enteral Feeding Bolus, , SEE ADMIN INSTRUCTIONS, QID (3,87,83,79), Given at 11/03/23 0600 **AND** Water Bolus, 100 mL, PEG Tube, QID (6,12,16,20), Sarmiento, Khulan Z, DO, 100 mL at 11/03/23 0600    doxazosin (CARDURA) tablet 2 mg, 2 mg, Feeding Tube, QHS, Sarmiento, Khulan Z, DO, 2 mg at 11/02/23 2137    enoxaparin (LOVENOX) syringe 40 mg, 40 mg, Subcutaneous, QDAY(21), Fortune, Stephanie M, DO, 40 mg at 11/02/23 2136    insulin aspart (U-100) (NOVOLOG FLEXPEN U-100 INSULIN) injection PEN 0-12 Units, 0-12 Units, Subcutaneous, QID, Wladyslawa Disbro K, MD, 2 Units at 11/02/23 1654    insulin aspart (U-100) (NOVOLOG FLEXPEN U-100 INSULIN) injection PEN 14 Units, 14 Units, Subcutaneous, QID, Anakin Varkey K, MD, 14 Units at 11/03/23 0604    insulin glargine (LANTUS SOLOSTAR U-100 INSULIN) injection PEN 18 Units, 18 Units, Subcutaneous, QDAY, Fortune, Stephanie M, DO, 18 Units at 11/02/23 1222    lidocaine hcl (XYLOCAINE) 2 % jelly (URO-JET), , Topical, PRN, Fortune, Stephanie M, DO, Given at 10/30/23 1530    lisinopriL (ZESTRIL) tablet 20 mg, 20 mg, Feeding Tube, QDAY, Fortune, Stephanie M, DO, 20 mg at 11/03/23 9070    loperamide (IMODIUM A-D) capsule 2 mg, 2 mg, Feeding Tube, TID PRN, Fortune, Stephanie M, DO, 2 mg at 10/29/23 1834    melatonin tablet 5 mg, 5 mg, Feeding Tube, QHS, Fortune, Stephanie M, DO, 5 mg at 11/02/23 2136    metoprolol tartrate tablet 25 mg, 25 mg, Feeding Tube, BID, Fortune, Stephanie M, DO, 25 mg at 11/03/23 9070    nystatin (MYCOSTATIN) oral suspension 500,000 Units, 500,000 Units, Swish & Spit, QID, Fortune, Stephanie M, DO, 500,000 Units at 11/02/23 2136    pancrelipase 20,880 Units/sodium bicarbonate 650 mg ([place] CLOG DESTROYER), , Feeding Tube, PRN (On Call from Rx), Fortune, Stephanie M, DO    petrolatum (STYE) ophthalmic ointment 0.25 inch, 0.25 inch, Both Eyes, TID, Fortune, Stephanie M, DO, 0.25 inch at 11/02/23 2155    phenoL (CHLORASEPTIC) spray 2 spray, 2 spray, Mouth/Throat, PRN, Fortune, Stephanie M, DO, 2 spray at 11/02/23 2201    polyethylene glycol 3350 (MIRALAX) packet 17 g, 1  packet, Per KANDICE Denis, GIFFORD Orlan Ladonna Idell LULLA, MD, 17 g at 11/03/23 9068    rosuvastatin (CRESTOR) tablet 40 mg, 40 mg, Feeding Tube, QDAY, Fortune, Stephanie M, DO, 40 mg at 11/03/23 9071    senna (SENOKOT) tablet 2 tablet, 2 tablet, Per G Tube, QHS, Orlan Ladonna Idell LULLA, MD, 2 tablet at 11/02/23 2136    sertraline (ZOLOFT) tablet 50 mg, 50 mg, Feeding Tube, QDAY, Fortune, Stephanie M, DO, 50 mg at 11/03/23 9070    thiamine mononitrate (vit B1) tablet 100 mg, 100 mg, Per NG tube, QDAY, Fortune, Stephanie M, DO, 100 mg at 11/03/23 0929    Basic Metabolic Profile    Lab Results   Component Value Date/Time    NA 135 (L) 11/02/2023 06:28 AM    K 4.5 11/02/2023 06:28 AM    CA 9.3 11/02/2023 06:28 AM    CL 99 11/02/2023 06:28 AM    CO2 26 11/02/2023 06:28 AM    GAP 10 11/02/2023 06:28 AM    EGFR1 >60 11/13/2020 05:51 PM    Lab Results   Component Value Date/Time    BUN 21 11/02/2023 06:28 AM    CR 0.76 11/02/2023 06:28 AM    GLU 153 (H) 11/02/2023 06:28 AM          Electronically signed by Omega MARLA Blunt, MD 11/03/2023

## 2023-11-03 NOTE — Progress Notes
 RT Adult Assessment Note    NAME:Kevin Obrien             MRN: [id number]             DOB:07/01/1950          AGE: 73 y.o.  ADMISSION DATE: 10/28/2023             DAYS ADMITTED: LOS: 6 days    Additional Comments:  Impressions of the patient: Pt resting in bed, NAD, 96% on RA.  Intervention(s)/outcome(s): RT re-eval  Patient education that was completed: n/a  Recommendations to the care team: n/a    Vital Signs:  Pulse: 71  RR: 18 PER MINUTE  SpO2: 96 %  O2 Device: None (Room air)  Liter Flow:    O2%:      Breath Sounds:   All Breath Sounds: Clear (Implies normal);Decreased  Respiratory Effort:      Comments:

## 2023-11-03 NOTE — Progress Notes
 OCCUPATIONAL THERAPY     11/03/23 1300   Time Calculation   Start Time 1300   Stop Time 1330   Time Calculation 30   Rehab Medical Excuse/No OT Tx 30 minutes   $$ Professional Contact 1 unit   $$ Therapeutic Activity 2 unit - 30 min   Subjective   Subjective Pt repeatedly referencing his face/neck/throat pain (e.g. gesturing, wincing, dolor) and covering his eyes throughout session. Pt's family members present and providing significant encouragement for pt to participate in therapy. After 30 minutes, pt unable to maintain attention to task and requiring pain intervention; per Dr. Andria, rest of session to be medically excused.   Precautions   Comments L sided weakness, visual impairment (horizontal gaze palsy), PEG   Oral Hygiene   Assist Level Supervision or touching assistance   Position HOB elevated   Comments Pt used oral swab to brush teeth. Cues to open mouth and initiate task.   Therapeutic Interventions   Therapeutic Activities To address vision and UE coordination, pt participated in block sorting/stacking activity. With choices of three color piles, pt directed therapist to sort blocks by color with good accuracy (1 error out of 10 trials). With encouragement, pt used RUE to sort 5 additional 1 blocks with good color accuracy and fair ability to stack without knocking over the towers. When retrieving blocks from therapist's hand, pt intermittently reached beyond the block, demonstrating decreased vision and/or coordination. Required increased time and encouragement.   Functional Cognition Pt attempted to engage in conversation regarding leisure participation. Pt's eyes remained shut through majority of conversation, and he frequently pointed to family members to respond for him. Demonstrated difficulty maintaining alertness throughout session.   Assessment   Assessment Pt limited by face/neck/throat pain and decreased alertness this session; unable to participate in full session, despite encouragement from family and therapist.   Plan   OT Plan LUE NMR, vision assessment (5/23), dynamic sitting balance, progress transfers, standing balance, standing for ADLs as able, all ADLs     Lamarr Reef, OTR/L

## 2023-11-04 LAB — POC GLUCOSE
~~LOC~~ BKR POC GLUCOSE: 227 mg/dL — ABNORMAL HIGH (ref 70–100)
~~LOC~~ BKR POC GLUCOSE: 129 mg/dL — ABNORMAL HIGH (ref 70–100)
~~LOC~~ BKR POC GLUCOSE: 176 mg/dL — ABNORMAL HIGH (ref 70–100)
~~LOC~~ BKR POC GLUCOSE: 253 mg/dL — ABNORMAL HIGH (ref 70–100)

## 2023-11-04 MED ORDER — OXYCODONE 5 MG PO TAB
5 mg | Freq: Two times a day (BID) | ORAL | 0 refills | Status: DC | PRN
Start: 2023-11-04 — End: 2023-11-07
  Administered 2023-11-05 – 2023-11-07 (×3): 5 mg via ORAL

## 2023-11-04 NOTE — Progress Notes
 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  male admitted to [facility] on 10/28/2023 with the following issues: stroke      Rehabilitation Plan  Tentative discharge date: 11/19/2023  Rehabilitation: Patient will continue with comprehensive therapies including physical therapy, occupational therapy, speech & language pathology, specialized rehab nursing, neuropsychology and physiatry oversight.    Goals:  Patient Will Perform Basic Cares With: Partial/moderate assistance, Caregiver assist of one  Patient Will Perform Household Mobility With: Partial/moderate assistance, At wheelchair level  Recommended therapy after discharge: Consistent supervision, Home setting with home health, Versus, Skilled nursing facility  PT recommended equipment: Too early to determine (likely RW and wheelchair Web designer))  OT recommended equipment: Tub transfer bench    Daily Functional Update:  Stand to Sit Assist Level: Substantial/maximal assistance (11/01/2023  2:50 PM)  Equipment: Hand hold assist (11/01/2023  2:50 PM)     Stand Pivot Assist Level: Dependent (11/01/2023  2:50 PM)  Equipment: Hand hold assist (11/01/2023  2:50 PM)     Slideboard No data recorded  No data recorded   Gait Distance: 3 (11/01/2023  1:30 PM)    Assist Level: Dependent (11/01/2023  1:30 PM)    Equipment: Hand hold assist; Wheelchair follow (11/01/2023  1:30 PM)     Wheelchair No data recorded  No data recorded   Toileting       Assist Level: Dependent (10/31/2023  1:00 PM)  Equipment: Grab bar (10/30/2023  2:00 PM)     Toilet Transfer No data recorded   Upper Body Dressing Assist Level: Substantial/maximal assistance (10/29/2023  1:00 PM)     Lower Body Dressing Assist Level: Substantial/maximal assistance (10/29/2023  1:00 PM)       Diet: PO: Ice chips only (10/29/2023  9:15 AM)      Orientation Log & Cognitive Log Scores for the past 72 hrs (Last 3 readings):   Orientation Log Score (Out of 30)   11/03/23 0930 19/30           Current Medical Problems/Risks of Medical Complications/Management    Acute Right Pontomedullary Ischemic Stroke  Proximal Right Intracranial Vertebral Artery Occlusion  Diffuse Intracranial Atherosclerosis  Bilateral Horizontal Gaze Palsy  History of  Forced Left Gaze Deviation  Left Facial Palsy  Impairments: left hemiparesis, expressive aphasia, oropharyngeal dysphagia, right facial weakness, left facial droop with dysarthria, diplopia, concern for attention/cognitive impairments, LUE ataxia, neurogenic bowel, neurogenic bladder  Impaired mobility and ADL, impaired swallow and cognition/communication               > consult PT/OT/SLP and neuropsychology to evaluate and treat               > consider discussion with patient/family regarding the use of modafinil if daytime fatigue becomes an ongoing issue               > Sleep optimization: melatonin 5mg  qhs, previously on seroquel nightly > follow up: Neurology, PM&R, Cardiology     Increased Somnolence - Resolved  - Concern for increased somnolence 5/20 leading to Neurology Consult- rec CTA H&N; discussed with team may attempt to liberalize BP I.e. decreasing amlodipine to 5mg  due to symptoms possibly 2/2 to hypoperfusion  - CTA Head and Neck - negative for acute change  - Infectious workup including UA, CXR- negative for acute concerns  > appreciate further neurology recommendations- if acute focal weakness, numbness, coma, etc. Stroke activate/send to ED as he is high risk to have progression of his stroke/basilar involvement. Signed off.      Secondary Stroke Risk Optimization  > Antiplatelet/Anticoagulation: DAPT with aspirin 81mg  daily and clopidogrel 75mg  daily for 90 days followed by aspirin 81mg  indefinitely  > Blood Pressure Goal: <160 during acute hospitalization, long term goal of < 130/80               - Current antihypertensive medications: amlodipine 5mg  daily, lisinopril 20mg  daily  > Lipids: LDL is 161, with goal of <70: rosuvastatin 40mg  daily (per Cardiology recs)  > Blood Glucose: Hgb A1c is 11.7%, with goal of <7  > Tobacco abstinence goal     At risk for Spasticity               > consider trial of baclofen if indicated, discuss potential benefits and side effects               > daily ROM with therapy      Dysphagia s/p PEG tube placement  Moderate Malnutrition  - PEG tube placement 5/15               > Dietician consult, appreciate tube feed recommendations- transition from continuous to bolus feeds 5/19               Recommend Jevity 1.5 bolus feeds, 5 cartons/day + 1 prosource pkt.     Recommend 1/2 carton per bolus for first 1-2 feeds, then proceed to goal regimen.    This will provide 1857kcal, 96g protein, free H2O.    Recommend 30mL free H2O flush before and after each bolus + 100mL q4h to meet fluid needs.   To help with loose stools, before interrupting EN support:    Avoid sorbitol containing medications such as liquid tylenol as these can make diarrhea worse; recommend switching to crushable tablet form.    Consider probiotic.    If loose stools persist, recommend adding 1-2 packets of nutrisource fiber per day.      Aspiration Pneumonia  S/p AHRF  - increased O2 req overnight 5/13 with elevated procal, tachycardia, leukocytosis  - CXR 5/13 notable for mild basal opacities, favored to represent atelectasis  - s/p vanc x1 and zosyn x3 days  -  sputum cx NGTD               > Augmentin to complete total 7 day course of antibiotics through 5/20 AM     Left Bell's Palsy               > Alternate eye patches; eyepatch for the L eye overnight, artificial tears TID daily in the setting of left facial palsy     Pseudobulbar Affect  > Per Neurology: Consider starting Dextromethorphan-Quinidine in the outpatient setting (unfortunately unavailble on the formulary inpatient) for Pseudobulbar affect, will start Fluoxetine 20mg  in the meantime --> instead was started on Sertraline 50mg , will continue while in rehab  > Neurology clinic follow up on dc     Oral Thrush               > oral care TID   > Nystatin swish and spit started 5/20, ok'd by SLP     Bowel Management  At risk for Neurogenic Bowel  Loose stools (resolved)  Constipation  -Last Bowel Movement Date: 11/03/23 (11/03/2023  8:30 PM)  - Goal for a daily BM or every 24-48 hours.   - c diff negative 5/18  > adjust bowel regimen PRN   > loperamide PRN  > Senna 2 tabs QHS via g tube  > MiraLax daily via g tube     Bladder Management  Neurogenic Bladder with Retention       -Post-Void (PVR) Bladder Scan (mL): 210 milliliters (11/03/2023  8:20 PM)  -Straight Cath (mL): 500 (11/04/2023  6:00 AM)  > Monitor urinary retention per unit protocol. If unable to void or PVRs >300cc perform clean ISC (intermittent straight catheterization) for volumes > . Stop checking BVIs if 3 consecutive volumes <150cc.   > Consider foley placement  >  doxazosin 2mg , increased 5/23 Hypertension  Orthostatic Hypotension  LVOT Obstruction  PFO  CAD s/p PCI               > metoprolol 25mg  BID               > amlodipine 10mg  daily decreased to 5mg  5/20, lisinopril 20mg  daily               > 30 day MCOT    > compression stockings to prevent sx orthostatic hypotension               > Follow up with outpatient Cardiology      Diabetes Mellitus               > Endocrinology consult for ongoing discharge regimen planning     Depression/Mood screen :          > neuropsychology to evaluate and treat for adjustment to new deficits          > Encouragement and support    Nutrition:   Body mass index is 23.37 kg/m?SABRA  Malnutrition Details:  Malnutrition present on admission  ICD-10 code E44: Chronic illness/Moderate non-severe malnutrition      Mild loss of muscle mass, Mild loss of body fat, Energy intake: Less than 75% of estimated energy requirement for 1 month or more        Loss of Subcutaneous Fat: Yes Mild Orbital, Triceps  Muscle Wasting: Yes Moderate Clavicle, Temple, Deltoid               Malnutrition Interventions: EN recs to meet 100% estimated kcal and protein needs  Wounds:        DVT ppx: enoxaparin (LOVENOX) syringe 40 mg  QDAY(21); patient will not require at the time of discharge    Code Status: Full Code    Current Medications:  Scheduled Meds:acetaminophen (TYLENOL) tablet 650 mg, 650 mg, Feeding Tube, Q6H*  amLODIPine (NORVASC) tablet 5 mg, 5 mg, Per NG tube, QDAY  artificial tears (PF) single dose ophthalmic solution 1 drop, 1 drop, Left Eye, TID  aspirin chewable tablet 81 mg, 81 mg, Per NG tube, QDAY  carbamide peroxide (DEBROX) 6.5 % otic solution 5 drop, 5 drop, Both Ears, BID  clopiDOGreL (PLAVIX) tablet 75 mg, 75 mg, Per NG tube, QDAY  diclofenac sodium (VOLTAREN) 1 % topical gel 2 g, 2 g, Topical, QID  Diet Enteral Feeding Bolus, , SEE ADMIN INSTRUCTIONS, QID (3,87,83,79)   And  Water Bolus, 100 mL, PEG Tube, QID (3,87,83,79)  doxazosin (CARDURA) tablet 2 mg, 2 mg, Feeding Tube, QHS  enoxaparin (LOVENOX) syringe 40 mg, 40 mg, Subcutaneous, QDAY(21)  insulin aspart (U-100) (NOVOLOG FLEXPEN U-100 INSULIN) injection PEN 0-12 Units, 0-12 Units, Subcutaneous, QID  insulin aspart (U-100) (NOVOLOG FLEXPEN U-100 INSULIN) injection PEN 14 Units, 14 Units, Subcutaneous, QID  insulin glargine (LANTUS SOLOSTAR U-100 INSULIN) injection PEN 18 Units, 18 Units, Subcutaneous, QDAY  lidocaine (LIDODERM) 5 % topical patch 1 patch, 1 patch, Topical, QDAY  lisinopriL (ZESTRIL) tablet 20 mg, 20 mg, Feeding Tube, QDAY  melatonin tablet 5 mg, 5 mg, Feeding Tube, QHS  metoprolol tartrate tablet 25 mg, 25 mg, Feeding Tube, BID  nystatin (MYCOSTATIN) oral suspension 500,000 Units, 500,000 Units, Swish & Spit, QID  petrolatum (STYE) ophthalmic ointment 0.25 inch, 0.25 inch, Both Eyes, TID  polyethylene glycol 3350 (MIRALAX) packet 17 g, 1 packet, Per G Tube, QDAY  rosuvastatin (CRESTOR) tablet 40 mg, 40 mg, Feeding Tube, QDAY  senna (SENOKOT) tablet 2 tablet, 2 tablet, Per G Tube, QHS  sertraline (ZOLOFT) tablet 50 mg, 50 mg, Feeding Tube, QDAY  thiamine mononitrate (vit B1) tablet 100 mg, 100 mg, Per NG tube, QDAY    Continuous Infusions:  PRN and Respiratory Meds:bisacodyL QDAY PRN, dextrose 50% (D50) IV PRN, diclofenac sodium QID PRN, lidocaine hcl PRN, loperamide TID PRN, pancrelipase 20,880 Units/sodium bicarbonate 650 mg (Cohoes CLOG DESTROYER) PRN (On Call from Rx), phenoL PRN      Subjective     NAEON. Seen while participating in therapy activities in the gym. No acute complaints or concerns in morning. Reports good sleep. Pain well controlled with multimodal regimen, jaw pain improved with topical regimen. Also tolerating Debrox well for earwax. Tolerating therapy activities well.  VS WNL, labs stable trend.      Objective                          Vital Signs: Last Filed                 Vital Signs: 24 Hour Range   BP: 123/65 (05/26 0401)  Temp: 36.9 ?C (98.5 ?F) (05/26 0401)  Pulse: 67 (05/26 0401)  Respirations: 18 PER MINUTE (05/26 0401)  SpO2: 97 % (05/26 0401)  O2 Device: None (Room air) (05/26 0401) BP: (123-131)/(57-65)   Temp:  [36.9 ?C (98.5 ?F)]   Pulse:  [67-71]   Respirations:  [18 PER MINUTE]   SpO2:  [97 %]   O2 Device: None (Room air)     Vitals:    10/28/23 1324 10/30/23 1511 11/04/23 0401  Weight: 59.7 kg (131 lb 9.8 oz) 59.4 kg (131 lb) 63.7 kg (140 lb 6.9 oz)       Intake/Output Summary:  (Last 24 hours)    Intake/Output Summary (Last 24 hours) at 11/04/2023 0924  Last data filed at 11/04/2023 9390  Gross per 24 hour   Intake 1462 ml   Output 1980 ml   Net -518 ml        Last Bowel Movement Date: 11/03/23    Labs--reviewed  Results for orders placed or performed during the hospital encounter of 10/28/23 (from the past 24 hours)   POC GLUCOSE    Collection Time: 11/03/23 11:11 AM   # # Low-High    Glucose, POC 182 (H) 70 - 100 mg/dL   POC GLUCOSE    Collection Time: 11/03/23  3:45 PM   # # Low-High    Glucose, POC 186 (H) 70 - 100 mg/dL   POC GLUCOSE    Collection Time: 11/03/23  8:26 PM   # # Low-High    Glucose, POC 227 (H) 70 - 100 mg/dL   POC GLUCOSE    Collection Time: 11/04/23  6:08 AM   # # Low-High    Glucose, POC 129 (H) 70 - 100 mg/dL   CBC CELLULAR THERAPEUTICS    Collection Time: 11/04/23  6:54 AM   # # Low-High    White Blood Cells 8.10 4.50 - 11.00 10*3/uL    Red Blood Cells 4.28 (L) 4.40 - 5.50 10*6/uL    Hemoglobin 13.0 (L) 13.5 - 16.5 g/dL    Hematocrit 63.3 (L) 40.0 - 50.0 %    MCV 85.4 80.0 - 100.0 fL    MCH 30.4 26.0 - 34.0 pg    MCHC 35.6 32.0 - 36.0 g/dL    RDW 87.2 88.9 - 84.9 %    Platelet Count 252 150 - 400 10*3/uL    MPV 9.2 7.0 - 11.0 fL   BASIC METABOLIC PANEL CELLULAR THERAPEUTICS    Collection Time: 11/04/23  6:54 AM   # # Low-High    Sodium 136 (L) 137 - 147 mmol/L    Potassium 4.3 3.5 - 5.1 mmol/L    Chloride 100 98 - 110 mmol/L    Glucose 132 (H) 70 - 100 mg/dL    Blood Urea Nitrogen 21 7 - 25 mg/dL    Creatinine 9.21 9.59 - 1.24 mg/dL    Calcium 9.5 8.5 - 89.3 mg/dL    CO2 27 21 - 30 mmol/L    Anion Gap 9 3 - 12    Glomerular Filtration Rate (GFR) >60 >60 mL/min         Physical Exam  General: no distress, seen in gym  HEENT: oral mucosa moist   Heart: Extremities warm and well perfused  Lungs: Normal work of breathing on room air  Abdomen: Non-distended, gastrostomy tube in situ with healthy base skin and without leaks or erythema.  Extremities: Peripheral edema is not present  Psych: mood and affect seemingly congruent  Neuro: Alert, interactive, responds to simple questions however hypophonic and dysarthric. Left facial paralysis, bilateral horizontal gaze palsy appreciated. Bilateral upper extremity at least antigravity shoulder abduction, 5/5 ADF.       Labs & Therapy Notes Reviewed    Ladonna Idell LULLA Orlan, MD   Physical Medicine & Rehabilitation  For patient care questions contact Rehab Medicine 3 FIRST CALL via Voalte.

## 2023-11-04 NOTE — Progress Notes
 OCCUPATIONAL THERAPY     11/04/23 1300   Time Calculation   Start Time 1300   Stop Time 1400   Time Calculation 60   $$ Phys ADL Skills 1 unit   $$ Therapeutic Activity 3 unit - 45 min   Subjective   Subjective Pt agreeable to participate in therapy with significant encouragement. Repeatedly referenced head/face pain and wanted to be in bed.   Precautions   Comments L sided weakness, visual impairment (horizontal gaze palsy), PEG   Toileting   Comments RN performing straight catheterization at OT arrival. Pt performed pants management (pulling underwear/pants up from ankles to hips) in supine with minimal assist to reach waistband. Significantly increased time for problem-solving and motor coordination.   Sit to Lying   Patient Needs Assistance With Bilateral lower extremities;Trunk   Assist Level Partial/moderate assistance   Equipment No rails;HOB flat   Lying to Sitting on Side of Bed   Patient Needs Assistance With Extra time to complete the activity;Trunk   Assist Level Partial/moderate assistance   Equipment With rails;HOB flat   Comments Pt with multiple attempts to elevate trunk before accepting help with shoulders.   Sit to Stand   Patient Needs Assistance With Trunk;Facilitation of hip extension;Facilitation of trunk;Facilitation of weight shift;Balance;Cues for sequencing   Assist Level Substantial/maximal assistance   Equipment Hand hold assist   Stand to Sit   Patient Needs Assistance With Trunk;Facilitation of weight shift;Balance;Cues for sequencing   Assist Level Substantial/maximal assistance   Equipment Hand hold assist   Stand Pivot Transfer   Patient Needs Assistance With Extra time to complete activity;Financial planner;Facilitation of hip flexion;Facilitation of trunk;Facilitation of weight shift;Cues for positioning of feet;Cues for positioning of hands;Cues for sequencing;Balance   Assist Level Substantial/maximal assistance   Equipment Hand hold assist   Comments Bed <> wheelchair Therapeutic Interventions   Therapeutic Activities Pt participated in activity with foam balls to address vision, UE NMR, trunk strengthening, and crossing midline. Performed tasks seated in wheelchair with emphasis on engaging core and leaning forward in chair; no losses of balance, though pt demonstrated proximal fatigue toward end of activity. As balls were rolled to pt across the tabletop one at a time, he retrieved them and placed in buckets on either side of the table; performed approx. x40 with RUE and x30 with LUE. Pt then retrieved balls held beyond base of support and placed in buckets; approx. x20 with RUE and x10 with LUE. Pt frequently grasped beyond object and indicated being limited by diplopia. Pt then retrieved 9 cans of various weights scattered across tabletop and placed in tote; used RUE with increased time and required moderate cues for visual scanning to edges of table.   Therapeutic Exercise While supine, pt alternated bicep and tricep activation against moderate resistance from therapist x15.   Assessment   Assessment Progressing with functional LUE use. Pt continues to benefit from significant encouragement to participate in therapy.   Weekly OT Progress Supportive family unit, progressing with LUE use during functional activities   Weekly OT Barriers/Concerns Significant face/throat/neck pain limiting therapy participation this weekend, vision deficits   Plan   OT Plan LUE NMR, vision assessment (5/23), dynamic sitting balance, progress transfers, standing balance, standing for ADLs as able, all ADLs   Recommendations   OT Discharge Recommendations Consistent supervision;Home setting with home health;Versus;Skilled nursing facility   OT Equipment Recommendations Tub transfer bench   Short Term Goals (Weekly)   ADL Goals Toileting;Footwear;Lower body dressing   Patient  Will Perform Toileting With Partial/moderate assistance;Progressing   Patient Will Perform Footwear With Partial/moderate assistance;Progressing   Patient Will Perform Lower Body Dressing With Partial/moderate assistance;Progressing   Self-Care Transfers All self-care transfers   Patient Will Perform All Self-Care Transfers With Partial/moderate assistance;Progressing   Overall Team Goal   Patient Will Perform Basic Cares With Partial/moderate assistance;Progressing     Kevin Obrien, OTR/L

## 2023-11-04 NOTE — Rehab Team Speech Therapy Barriers and Concerns
 NPO  Unknown who will provide assistance with medication/finance management upon discharge  High burden of care

## 2023-11-04 NOTE — Progress Notes
 PHYSICAL THERAPY     11/04/23 0845   Type of Note   Type of Note Daily   Time Calculation   Start Time 0845   Stop Time 0945   Time Calculation 60   $$ PT Therapeutic Activity 2 units   $$ Neuromuscular Re-education 2 units    Subjective   Subjective Patient supine in bed with daughter at bedside. Patient agreeable to therapy session, reports improved pain from previous day.   Precautions   Comments L sided weakness, visual impairment (horizontal gaze palsy), PEG   Lying to Sitting on Side of Bed   Patient Needs Assistance With Extra time to complete the activity;Trunk   Assist Level Partial/moderate assistance   Equipment With rails;HOB elevated   Comments Able to walk legs to edge of bed, but requested help to push up from bed   Sit to Lying   Patient Needs Assistance With Bilateral lower extremities;Extra time to complete the activity;Trunk   Assist Level Substantial/maximal assistance   Equipment No rails;HOB flat   Sit to and from Stand   Sit to and from Stand Sit to stand;Stand to sit   Sit to Stand   Patient Needs Assistance With Trunk;Facilitation of hip extension;Facilitation of trunk;Facilitation of weight shift;Balance   Assist Level Substantial/maximal assistance   Equipment Hand hold assist   Comments modAx1, 2nd person minAx1   Stand to Sit   Patient Needs Assistance With Trunk;Facilitation of weight shift;Balance   Assist Level Substantial/maximal assistance   Equipment Hand hold assist   Stand Pivot Transfer   Patient Needs Assistance With Extra time to complete activity;Financial planner;Facilitation of hip flexion;Facilitation of trunk;Facilitation of weight shift;Cues for positioning of feet;Cues for positioning of hands;Cues for sequencing;Balance;Assist of two   Assist Level Substantial/maximal assistance   Equipment Hand hold assist   Comments modAx1, 2nd person minAx1   Therapeutic Interventions   Therapeutic Activities Pt requesting to brush teeth at start of session, assistance to cap tooth paste, increased time needed for sequencing.   Neuromuscular Reeducation Neurogym 30# assist x8. Weight shifts at top of stand, cues for head position and hip extension   Other Activity Patient stands with table in front and plays connect 4. Patient with difficulty placing chips at first, but with further practice, patient improves with chip placement accuracy.   Assessment   Assessment Patient tolerates therapy session well working on standing balance, standing tolerance, and transfers. Requiring moderate Ax1 and min Ax1 for sit to stand and pivot transfers.   Weekly PT Progress Improving standing tolerance and transfers. Ambulated 35' x6 with Rifton.   Weekly PT Barriers/Concerns Requiring Ax2 for transfers, Face/neck pain limiting over weekend.   Plan   PT Plan gait training (RW with wheelchair follow vs consider vector to help with posture vs rifton), neurogym (consider using mirror for visual feedback), standing/reaching activities, trial stairs as appropriate, nustep for conditioning, outcome measures as appropriate   Recommendations   PT Equipment Recommendations Too early to determine  (likely RW and wheelchair (lightweight vs custom ultralight))       Lyle Patient, PT, DPT [id number]

## 2023-11-04 NOTE — Progress Notes
 SPEECH-LANGUAGE PATHOLOGY       11/04/23 1030   Behavior   Comments* Pt resting in bed with RN and Dr. Andria present. Pt reporting pain to left jaw/neck area. Pt agreeable to ST session despite ongoing report of pain.   Dysphagia Goals   Therapy Activities PO trials   PO Trials Thin liquids;Mildly thick liquids;Puree   Therapy Activity Comments Limited PO trials provided 2/2 pt report of pain and increased in s/s aspiration with trials. Pt decline ice chips 2/2 report of pain but agreeable to trials of thin/mildly thick liquids and pureed solids. All liquid trials provided via 5cc medicine cup. L-sided anterior loss x2 in which pt demonstrated sensory awareness. Ongoing suspicion for premature spillage + delayed swallow initiation based upon observation and previous FEES however subjectively appeared to be slightly improved w/ mildly thick liquids. Immediate cough x2 with controlled volume thin liquid sips requiring increased time for cough/recovery. Verbal cues for swallow initiation with pureed solids and use of single bites as pt often attempting to consumed 3-4 small bites before swallow initiation. Intermittent vocal wetness noted requiring cues for throat clear however no coughing with pureed solids.    Pt declined swallow exercises 2/2 report of orofacial pain.   Patient Will Tolerate PO Trials to Advance Diet with Moderate prompting - patient able to 25-49% of the time;Progressing   Orientation Goals   Patient Will Perform on the Orientation Log with Standby prompting - patient able to > 90% of the time, prompting < 10%;Progressing   Memory Goals   Patient Will Use a Strategy to Recall Verbally Presented Information with Moderate prompting - patient able to 25-49% of the time;Progressing   Attention Goals   Therapy Activities Sustained attention   Therapy Activity Comments Sustained attention targeted via CT App: Find the same symbol. Attempted level 2 (5-6 targets) however required adjustment to level 1 (3-4  targets) 2/2 difficulty with vision + visual scanning/tracking with increase in targets. Ongoing min-mod cues required.   Patient Will Complete Sustained Attention Task with Moderate prompting - patient able to 25-49% of the time;Progressing   Problem Solving Goals   Therapy Activities Sequencing;Financial management/money skills   Financial Management/Money Skills Counting currency   Therapy Activity Comments Sequencing targeted with transfers + oral cares. Ongoing verbal/visual cues for hand/feet placement in addition to sequencing throughout transfer. Set-up assist provided for oral care with brushing teeth and cues for initiation/termination.     CT App: Count the money. Level 1 completed w/ 70% accuracy however errors appeared to be attributed to difficulty with coin ID w/ pt report of blurriness. With cues for verbal clarification of coin, pt able to correctly complete.   Patient Will Perform Financial Management Tasks with Moderate prompting - patient able to 25-49% of the time;Progressing   Patient Will Demonstrate Adequate Problem Solving for Medication Management with Moderate prompting - patient able to 25-49% of the time;Progressing         Assessment Session limited by pain but remained participatory throughout. Slowly progressing towards functional goals.   Plan Cognition:  -- O-log  -- Simple attention/memory  -- Visual scanning  -- Medication management  -- Finance management     Dysphagia:  -- PO trials thin/mildly thick liquids, pureed solids  -- EMST-Lite  -- Swallow exercises as able  -- Consider IOPI     Miriam Dills, MA CCC-SLP

## 2023-11-04 NOTE — Progress Notes
 Brief Endocrine Note    Patient chart reviewed and not physically seen today. Glucose values reviewed as below:     Glucose, POC   Date/Time Value Ref Range Status   11/04/2023 0608 129 (H) 70 - 100 mg/dL Final   94/74/7974 7973 227 (H) 70 - 100 mg/dL Final   94/74/7974 8454 186 (H) 70 - 100 mg/dL Final   94/74/7974 8888 182 (H) 70 - 100 mg/dL Final   94/74/7974 9397 130 (H) 70 - 100 mg/dL Final   94/74/7974 9744 195 (H) 70 - 100 mg/dL Final   94/75/7974 7848 157 (H) 70 - 100 mg/dL Final   94/75/7974 8354 213 (H) 70 - 100 mg/dL Final   93/94/7977 7993 158 (H) 70 - 100 MG/DL Final   93/94/7977 8081 257 (H) 70 - 100 MG/DL Final   93/94/7977 8242 397 (H) 70 - 100 MG/DL Final   89/88/7978 8567 105 (H) 70 - 100 MG/DL Final   90/85/7979 8280 129 (H) 70 - 100 MG/DL Final   90/85/7979 9340 126 (H) 70 - 100 MG/DL Final   90/85/7979 9655 182 (H) 70 - 100 MG/DL Final   88/78/7980 9048 266  Final       Recommendations:   - Continue current insulin plan    Will continue to follow.     Omega Blunt, MD  Endocrinology, Metabolism, Clinical Pharmacology   Pager: [phone]  Available on Voalte

## 2023-11-05 LAB — POC GLUCOSE
~~LOC~~ BKR POC GLUCOSE: 186 mg/dL — ABNORMAL HIGH (ref 70–100)
~~LOC~~ BKR POC GLUCOSE: 126 mg/dL — ABNORMAL HIGH (ref 70–100)
~~LOC~~ BKR POC GLUCOSE: 156 mg/dL — ABNORMAL HIGH (ref 70–100)
~~LOC~~ BKR POC GLUCOSE: 166 mg/dL — ABNORMAL HIGH (ref 70–100)
~~LOC~~ BKR POC GLUCOSE: 193 mg/dL — ABNORMAL HIGH (ref 70–100)

## 2023-11-05 MED ORDER — MODAFINIL 100 MG PO TAB
100 mg | Freq: Every day | ORAL | 0 refills | Status: DC
Start: 2023-11-05 — End: 2023-11-06
  Administered 2023-11-05 – 2023-11-06 (×2): 100 mg via ORAL

## 2023-11-05 NOTE — Progress Notes
 Brief Endocrine Note    Patient chart reviewed and not physically seen today. Glucose values reviewed as below:Acceptable control.    Recent Labs     11/03/23  2026 11/04/23  0608 11/04/23  1143 11/04/23  1630 11/04/23  2120 11/05/23  0624 11/05/23  0651 11/05/23  1215   GLUPOC 227* 129* 176* 253* 186* 126* 156* 166*           Recommendations:   Continue insulin glargine 18u every day   Continue Aspart 14 units with each bolus feed  Continue mid dose correction factor to QID with bolus feeds     Diabetes Plans Post Discharge  Diabetes regimen: TBD  He will most likely need insulin on discharge.   We will request for outpatient follow-up. Family would like to come to [facility]. He used to see  DM clinic and last visit was in 2020      Will continue to follow.     Lura Monica, MD  Available on Voalte

## 2023-11-05 NOTE — Progress Notes
 CLINICAL NUTRITION                                                        Clinical Nutrition Initial Assessment    Name: Kevin Obrien   MRN: [id number]     DOB: [birth year]      Age: 73 y.o.  Admission Date: 10/28/2023     LOS: 8 days     Date of Service: 11/05/2023        Recommendation:  ADAT to least restrictive per SLP; currently strict NPO with ice chips only when brightly alert   EN Recs: Bolus Feeds of Jevity 1.5 x5 cartons daily; administered as 1 + 1/4 cartons ( ) QID .   Provides 1778kcal, 76g protein, free fluids  Recommend at minimum water flushes of 30mL before and after each bolus feed for tube patency.   Additional fluids per primary team, with consideration for water boluses of 100mL before and after bolus feeds (provides additional 800mL daily for total fluids of 1701ml/d)  The nutrition-related order modifications are made in communication with the primary service, who remains responsible for the orders and overall care of the patient.       Comments:  RD continues to follow Pt during admission, unable to visit with Pt directly today given Pt being busy with staff, however all chart review shows tolerance to current EN regimen following change to QID boluses of 1.25 cartons at each bolus, Pt still NPO, insulin regimen being adjusted as needed by Endocrine, blood glucose values under 175mg /dL so far today 4/72 with EN ongoing. RD will still plan to educate on diet for controlling blood glucose prior to discharge, RD notes VSS near the end of this week per chart review, would likely benefit mostly from diet education following diet advancement, given Pt receiving the same amount of PO calorie/protein each day from EN regimen, however, will follow up on education regardless prior to d/c. 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      Mild loss of muscle mass, Mild loss of body fat, Energy intake: Less than 75% of estimated energy requirement for 1 month or more            Malnutrition Interventions: EN recs to meet 100% estimated kcal and protein needs    Nutrition Focused Physical Exam:  Loss of Subcutaneous Fat: Yes; Severity: Mild; Location: Orbital, Triceps  Muscle Wasting: Yes; Severity: Moderate; Location: Clavicle, Temple, Deltoid  Physical Assessment:     Ascites: No  Pressure Injury: none documented     Comment: BM 5/26    Nutrition Diagnosis:  Inadequate oral intake  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          Odis Livers, RDN, LDN, CNSC  Available via [employer]

## 2023-11-05 NOTE — Progress Notes
 PHYSICAL THERAPY  NOTE      Name: Kevin Obrien   MRN: [id number]     DOB: 07/23/1950      Age: 73 y.o.  Admission Date: 10/28/2023     LOS: 8 days     Date of Service: 11/05/2023         11/05/23 1115   Type of Note   Type of Note Daily   Time Calculation   Start Time 1115   Stop Time 1145   Time Calculation 30   $$ Gait / Mobility 1 unit    $$ PT Therapeutic Activity 1 unit   Subjective   Subjective Patient in bed upon arrival, agreeable to PT. Once seated in chair, PT noticed foul smell and assisted patient to bathroom. Sitting up in recliner at end of session.   Precautions   Comments L sided weakness, visual impairment (horizontal gaze palsy), PEG   Cognitive   Patient Behavior Calm;Cooperative   Family Behavior Supportive   Lying to Sitting on Side of Bed   Patient Needs Assistance With Bilateral lower extremities;Trunk;Extra time to complete the activity   Assist Level Partial/moderate assistance   Equipment With rails;HOB elevated   Sit to and from Stand   Sit to and from Stand Sit to stand;Stand to sit   Sit to Stand   Patient Needs Assistance With Facilitation of hip extension;Facilitation of trunk;Facilitation of weight shift;Balance;Cues for sequencing;Cues for positioning of hands;Cues for positioning of feet;Extra time to complete activity;Wheelchair management   Assist Level Substantial/maximal assistance   Equipment Hand hold assist;Roller walker   Stand to Sit   Patient Needs Assistance With Facilitation of weight shift;Balance;Cues for sequencing;Extra time to complete activity;Financial planner;Facilitation of hip flexion;Facilitation of trunk;Cues for positioning of hands   Assist Level Substantial/maximal assistance   Equipment Hand hold assist;Roller walker   Chair/Bed-to-Chair Transfer   Chair/Bed-to-Chair Transfer Stand pivot   Stand Pivot Transfer   Patient Needs Assistance With Extra time to complete activity;Financial planner;Facilitation of hip flexion;Facilitation of trunk;Facilitation of weight shift;Cues for positioning of feet;Cues for positioning of hands;Cues for sequencing;Balance;Facilitation of hip extension   Assist Level Substantial/maximal assistance   Equipment Hand hold assist   Comments bed<>wheelchair<>toilet/recliner   Daily Care   Patient continent of bowel? No   Device Absorbent pad   Stool Occurrence 1   Accident  - Bowel Medium   Stool Appearance Soft;Loose   Stool Color Brown   Last Bowel Movement Date 11/05/23   Gait Distance 1   Distance 30' x2   Patient Needs Assistance With Facilitation of trunk;Cues for positioning of hands;Cues for step length;Balance;Cues for positioning of device;Cues to attend to environment   Assist Level Dependent   Equipment Roller walker;Wheelchair follow   Patterns Decreased gait velocity;Narrow base of support;Shuffling;Trunk lean forward   Deviation Left Decreased hip extension;Decreased knee flexion in swing;Decreased stride length;No heel strike/foot flat;Left scissoring   Deviation Right Decreased hip extension;Decreased knee flexion in swing;Decreased stride length;No heel strike/foot flat;Right scissoring   Activity Limited By Weakness   Comments required cues to terminate   Therapeutic Interventions   Therapeutic Activities toileting, changing brief, donning pants alol with Ax2  in seated and standing   Assessment   Assessment Patient participated in session. Incontinent of bowel and unaware. Continues to reqiure Ax2 for transfers and gait. Increased gait distance, noted to demo narrow base of support and even mild scissoring throughout. Unable to follow some cues regarding upright posture and walker management. Has tendency  to hold walker out in front and unable to correct despite cues and increased time.   Plan   PT Plan gait training (RW with wheelchair follow vs consider vector to help with posture vs rifton), neurogym (consider using mirror for visual feedback), standing/reaching activities, trial stairs as appropriate, nustep for conditioning, outcome measures as appropriate   Recommendations   PT Discharge Recommendations Consistent supervision;Home setting with home health;Versus;Skilled nursing facility   PT Equipment Recommendations Too early to determine  (likely RW and wheelchair (lightweight vs custom ultralight))         Therapist: Tereasa Free, PT, DPT  Date: 11/05/2023

## 2023-11-05 NOTE — Progress Notes
 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  male admitted to [facility] on 10/28/2023 with the following issues: stroke      Rehabilitation Plan  Tentative discharge date: 11/19/2023  Rehabilitation: Patient will continue with comprehensive therapies including physical therapy, occupational therapy, speech & language pathology, specialized rehab nursing, neuropsychology and physiatry oversight.    Goals:  Patient Will Perform Basic Cares With: Partial/moderate assistance, Progressing  Patient Will Perform Household Mobility With: Partial/moderate assistance, At wheelchair level  Recommended therapy after discharge: Consistent supervision, Home setting with home health, Versus, Skilled nursing facility  PT recommended equipment: Too early to determine (likely RW and wheelchair Web designer))  OT recommended equipment: Tub transfer bench    Daily Functional Update:  Stand to Sit Assist Level: Substantial/maximal assistance (11/04/2023  1:00 PM)  Equipment: Hand hold assist (11/04/2023  1:00 PM)     Stand Pivot Assist Level: Substantial/maximal assistance (11/04/2023  1:00 PM)  Equipment: Hand hold assist (11/04/2023  1:00 PM)     Slideboard No data recorded  No data recorded   Gait Distance: 3 (11/01/2023  1:30 PM)    Assist Level: Dependent (11/01/2023  1:30 PM)    Equipment: Hand hold assist; Wheelchair follow (11/01/2023  1:30 PM)     Wheelchair No data recorded  No data recorded   Toileting       Assist Level: Dependent (10/31/2023  1:00 PM)  Equipment: Grab bar (10/30/2023  2:00 PM)     Toilet Transfer No data recorded   Upper Body Dressing Assist Level: Substantial/maximal assistance (10/29/2023  1:00 PM)     Lower Body Dressing Assist Level: Substantial/maximal assistance (10/29/2023  1:00 PM)       Diet: PO: Ice chips only (10/29/2023  9:15 AM)      Orientation Log & Cognitive Log Scores for the past 72 hrs (Last 3 readings):   Orientation Log Score (Out of 30)   11/03/23 0930 19/30           Current Medical Problems/Risks of Medical Complications/Management    Acute Right Pontomedullary Ischemic Stroke  Proximal Right Intracranial Vertebral Artery Occlusion  Diffuse Intracranial Atherosclerosis  Bilateral Horizontal Gaze Palsy  History of Forced Left  Gaze Deviation  Left Facial Palsy  Impairments: left hemiparesis, expressive aphasia, oropharyngeal dysphagia, right facial weakness, left facial droop with dysarthria, diplopia, concern for attention/cognitive impairments, LUE ataxia, neurogenic bowel, neurogenic bladder  Impaired mobility and ADL, impaired swallow and cognition/communication               > consult PT/OT/SLP and neuropsychology to evaluate and treat               > Following discussion with patient and daughter, will start modafinil 100mg  qAM 5/27 AM to improve daytime alertness/fatigue, participation with therapy    > Zoloft 50mg  daily               > Sleep optimization: melatonin 5mg  qhs, previously on seroquel nightly               > follow up: Neurology, PM&R, Cardiology     Daytime Sleepiness  Fatigue  Increased Somnolence - Resolved  - Concern for increased somnolence 5/20 leading to Neurology Consult- rec CTA H&N; discussed with team may attempt to liberalize BP I.e. decreasing amlodipine to 5mg  due to symptoms possibly 2/2 to hypoperfusion  - CTA Head and Neck - negative for acute change  - Infectious workup including UA, CXR- negative for acute concerns  > appreciate further neurology recommendations- if acute focal weakness, numbness, coma, etc. Stroke activate/send to ED as he is high risk to have progression of his stroke/basilar involvement. Signed off.      Secondary Stroke Risk Optimization  > Antiplatelet/Anticoagulation: DAPT with aspirin 81mg  daily and clopidogrel 75mg  daily for 90 days followed by aspirin 81mg  indefinitely  > Blood Pressure Goal: <160 during acute hospitalization, long term goal of < 130/80               - Current antihypertensive medications: amlodipine 5mg  daily, lisinopril 20mg  daily, metoprolol 25mg  BID  > Lipids: LDL is 161, with goal of <70: rosuvastatin 40mg  daily (per Cardiology recs)  > Blood Glucose: Hgb A1c is 11.7%, with goal of <7  > Tobacco abstinence goal     At risk for Spasticity               > consider trial of baclofen if indicated, discuss potential benefits and side effects               > daily ROM with therapy      Dysphagia s/p PEG tube placement  Moderate Malnutrition  - PEG tube placement 5/15               > Dietician consult, appreciate tube feed recommendations- transition from continuous to bolus feeds 5/19               Recommend Jevity 1.5 bolus feeds, 5 cartons/day + 1 prosource pkt.     Recommend 1/2 carton per bolus for first 1-2 feeds, then proceed to goal regimen.    This will provide 1857kcal, 96g protein, free H2O.    Recommend 30mL free H2O flush before and after each bolus + 100mL q4h to meet fluid needs.   To help with loose stools, before interrupting EN support:    Avoid sorbitol containing medications such as liquid tylenol as these can make diarrhea worse; recommend switching to crushable tablet form.    Consider probiotic.    If loose stools persist, recommend adding 1-2 packets of nutrisource fiber per day.      Left Bell's Palsy  Left TMJ pain  Ear  Wax B/L ear               > Alternate eye patches; eyepatch for the L eye overnight, artificial tears TID daily in the setting of left facial palsy   > lidocaine patch and tylenol to be continued as pt reports these improve left sided jaw pain   > Debrox added     Pseudobulbar Affect  > Per Neurology: Consider starting Dextromethorphan-Quinidine in the outpatient setting (unfortunately unavailble on the formulary inpatient) for Pseudobulbar affect, will start Fluoxetine 20mg  in the meantime --> instead was started on Sertraline 50mg , will continue while in rehab  > Neurology clinic follow up on dc     Oral Thrush               > oral care TID   > Nystatin swish and spit started 5/20, ok'd by SLP     Bowel Management  At risk for Neurogenic Bowel  Loose stools (resolved)  Constipation  -Last Bowel Movement Date: 11/04/23 (11/04/2023  1:10 PM)  - Goal for a daily BM or every 24-48 hours.   - c diff negative 5/18  > adjust bowel regimen PRN   > loperamide PRN  > Senna 2 tabs QHS via g tube  > MiraLax daily via g tube     Bladder Management  Neurogenic Bladder with Retention       -Post-Void (PVR) Bladder Scan (mL): 210 milliliters (11/03/2023  8:20 PM)  -Straight Cath (mL): 400 (11/05/2023  6:24 AM)  > Monitor urinary retention per unit protocol. If unable to void or PVRs >300cc perform clean ISC (intermittent straight catheterization) for volumes > . Stop checking BVIs if 3 consecutive volumes <150cc.   > Consider foley placement  >  doxazosin 2mg , increased 5/23     Hypertension  Orthostatic Hypotension  LVOT Obstruction  PFO  CAD s/p PCI               > metoprolol 25mg  BID               > amlodipine 10mg  daily decreased to 5mg  5/20, lisinopril 20mg  daily               > 30 day MCOT    > compression stockings to prevent sx orthostatic hypotension               > Follow up with outpatient Cardiology      Diabetes Mellitus               > Endocrinology consult for ongoing discharge regimen planning     Depression/Mood screen :          > neuropsychology to evaluate and treat for adjustment to new deficits          > Encouragement and support   > on zoloft 50mg  daily    Aspiration Pneumonia - Resolved  S/p AHRF  - increased O2 req overnight 5/13 with elevated procal, tachycardia, leukocytosis  - CXR 5/13 notable for mild basal opacities, favored to represent atelectasis  - s/p vanc x1 and zosyn x3 days  - sputum cx NGTD  - Augmentin to complete total 7 day course of antibiotics through 5/20 AM    Nutrition:   Body mass index is 23.37 kg/m?SABRA  Malnutrition Details:  Malnutrition present on admission  ICD-10 code E44: Chronic illness/Moderate non-severe malnutrition      Mild loss of muscle mass, Mild loss of body fat, Energy intake:  Less than 75% of estimated energy requirement for 1 month or more        Loss of Subcutaneous Fat: Yes Mild Orbital, Triceps  Muscle Wasting: Yes Moderate Clavicle, Temple, Deltoid               Malnutrition Interventions: EN recs to meet 100% estimated kcal and protein needs    Wounds:        DVT ppx: enoxaparin (LOVENOX) syringe 40 mg  QDAY(21); patient will not require at the time of discharge    Code Status: Full Code    Current Medications:  Scheduled Meds:acetaminophen (TYLENOL) tablet 650 mg, 650 mg, Feeding Tube, Q6H*  amLODIPine (NORVASC) tablet 5 mg, 5 mg, Per NG tube, QDAY  artificial tears (PF) single dose ophthalmic solution 1 drop, 1 drop, Left Eye, TID  aspirin chewable tablet 81 mg, 81 mg, Per NG tube, QDAY  carbamide peroxide (DEBROX) 6.5 % otic solution 5 drop, 5 drop, Both Ears, BID  clopiDOGreL (PLAVIX) tablet 75 mg, 75 mg, Per NG tube, QDAY  diclofenac sodium (VOLTAREN) 1 % topical gel 2 g, 2 g, Topical, QID  Diet Enteral Feeding Bolus, , SEE ADMIN INSTRUCTIONS, QID (3,87,83,79)   And  Water Bolus, 100 mL, PEG Tube, QID (3,87,83,79)  doxazosin (CARDURA) tablet 2 mg, 2 mg, Feeding Tube, QHS  enoxaparin (LOVENOX) syringe 40 mg, 40 mg, Subcutaneous, QDAY(21)  insulin aspart (U-100) (NOVOLOG FLEXPEN U-100 INSULIN) injection PEN 0-12 Units, 0-12 Units, Subcutaneous, QID  insulin aspart (U-100) (NOVOLOG FLEXPEN U-100 INSULIN) injection PEN 14 Units, 14 Units, Subcutaneous, QID  insulin glargine (LANTUS SOLOSTAR U-100 INSULIN) injection PEN 18 Units, 18 Units, Subcutaneous, QDAY  lidocaine (LIDODERM) 5 % topical patch 1 patch, 1 patch, Topical, QDAY  lisinopriL (ZESTRIL) tablet 20 mg, 20 mg, Feeding Tube, QDAY  melatonin tablet 5 mg, 5 mg, Feeding Tube, QHS  metoprolol tartrate tablet 25 mg, 25 mg, Feeding Tube, BID  modafiniL (PROVIGIL) tablet 100 mg, 100 mg, Oral, QDAY  nystatin (MYCOSTATIN) oral suspension 500,000 Units, 500,000 Units, Swish & Spit, QID  petrolatum (STYE) ophthalmic ointment 0.25 inch, 0.25 inch, Both Eyes, TID  polyethylene glycol 3350 (MIRALAX) packet 17 g, 1 packet, Per G Tube, QDAY  rosuvastatin (CRESTOR) tablet 40 mg, 40 mg, Feeding Tube, QDAY  senna (SENOKOT) tablet 2 tablet, 2 tablet, Per G Tube, QHS  sertraline (ZOLOFT) tablet 50 mg, 50 mg, Feeding Tube, QDAY  thiamine mononitrate (vit B1) tablet 100 mg, 100 mg, Per NG tube, QDAY    Continuous Infusions:  PRN and Respiratory Meds:bisacodyL QDAY PRN, dextrose 50% (D50) IV PRN, diclofenac sodium QID PRN, lidocaine hcl PRN, loperamide TID PRN, oxyCODONE BID PRN, pancrelipase 20,880 Units/sodium bicarbonate 650 mg ([place] CLOG DESTROYER) PRN (On Call from Rx), phenoL PRN      Subjective     No acute overnight evetns. Patient seen at bedside with daughter at bedside. Pt reports ongoing left jaw point, pointing right into TMJ. Daughter reports family has noticed he is more sensitive to sound and has fluctuating fatigue, sleep wake cycle disruption. Pt continues to require ISC with doxazosin. Last bowel movement 5/26.      Objective                          Vital Signs: Last Filed                 Vital Signs: 24 Hour Range   BP: 113/53 (05/27 0810)  Temp: 36.4 ?  C (97.5 ?F) (05/27 9383)  Pulse: 67 (05/27 0810)  Respirations: 18 PER MINUTE (05/27 0616)  SpO2: 97 % (05/27 0616)  O2 Device: None (Room air) (05/27 0616) BP: (113-141)/(53-70)   Temp:  [36.4 ?C (97.5 ?F)-36.8 ?C (98.3 ?F)]   Pulse:  [67-75]   Respirations:  [18 PER MINUTE]   SpO2:  [97 %]   O2 Device: None (Room air)     Vitals:    10/28/23 1324 10/30/23 1511 11/04/23 0401   Weight: 59.7 kg (131 lb 9.8 oz) 59.4 kg (131 lb) 63.7 kg (140 lb 6.9 oz)       Intake/Output Summary:  (Last 24 hours)    Intake/Output Summary (Last 24 hours) at 11/05/2023 1104  Last data filed at 11/05/2023 9375  Gross per 24 hour   Intake --   Output 2450 ml   Net -2450 ml        Last Bowel Movement Date: 11/04/23    Labs--reviewed  Results for orders placed or performed during the hospital encounter of 10/28/23 (from the past 24 hours)   POC GLUCOSE    Collection Time: 11/04/23 11:43 AM   # # Low-High    Glucose, POC 176 (H) 70 - 100 mg/dL   POC GLUCOSE    Collection Time: 11/04/23  4:30 PM   # # Low-High    Glucose, POC 253 (H) 70 - 100 mg/dL   POC GLUCOSE    Collection Time: 11/04/23  9:20 PM   # # Low-High    Glucose, POC 186 (H) 70 - 100 mg/dL   POC GLUCOSE    Collection Time: 11/05/23  6:24 AM   # # Low-High    Glucose, POC 126 (H) 70 - 100 mg/dL   POC GLUCOSE    Collection Time: 11/05/23  6:51 AM   # # Low-High    Glucose, POC 156 (H) 70 - 100 mg/dL         Physical Exam  General: no distress, seen lying in bed comfortably on room air, seemingly fatigued/sleepy  HEENT: oral mucosa moist; able to close left eye; guides examiner's hand to left TMJ joint to localize area of pain, TTP; no TTP to the anterior/lateral left neck  Heart: Extremities warm and well perfused  Lungs: Normal work of breathing on room air  Abdomen: Non-distended, +G tube  Extremities: Peripheral edema is not present  Psych: mood and affect seemingly congruent  Neuro: Alert, responds to simple questions however hypophonic and dysarthric. Left facial paralysis, bilateral horizontal gaze palsy appreciated. Bilateral upper extremities with spontaneous movement      Labs & Therapy Notes Reviewed    Corean CHRISTELLA Brewster, DO   Physical Medicine & Rehabilitation  For patient care questions contact Rehab Medicine 3 FIRST CALL via Voalte.

## 2023-11-05 NOTE — Progress Notes
 PHYSICAL THERAPY  NOTE      Name: Kevin Obrien   MRN: [id number]     DOB: 1951-04-02      Age: 73 y.o.  Admission Date: 10/28/2023     LOS: 8 days     Date of Service: 11/05/2023         11/05/23 1445   Type of Note   Type of Note Daily   Time Calculation   Start Time 1445   Stop Time 1515   Time Calculation 30   $$ Gait / Mobility 1 unit    $$ PT Therapeutic Activity 1 unit   History   Reason For Admission The patient presented to [facility] on 10/17/2023 with left gaze deviation, facial weakness and dysarthria.  CT head was negative for acute changes.  CTA head and neck without LVO.  Patient was not a TNK or EVT candidate. 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 Neurology recommend DAPT with aspirin and Plavix.  He is currently on permissive hypertension.   Previous Medical History 73 y.o. male and  has a past medical history of Accidental fall, Angina pectoris, Arthritis, BPH (benign prostatic hyperplasia), Cataract, Constipation, Coronary artery disease, DM (diabetes mellitus) (CMS-HCC), ED (erectile dysfunction), Heart attack (CMS-HCC), Hyperlipidemia, Hypertension, Joint pain, Neuropathy, and Shingles.   Subjective   Subjective Patient sitting up in wheelchair following OT session.  Agreeable to therapy   Precautions   Comments L sided weakness, visual impairment (horizontal gaze palsy), PEG   Sit to Lying   Patient Needs Assistance With Left lower extremity   Assist Level Partial/moderate assistance   Equipment No rails;HOB flat   Comments minimal assist   Sit to Stand   Patient Needs Assistance With Facilitation of hip extension;Facilitation of trunk;Facilitation of weight shift;Balance;Cues for sequencing;Cues for positioning of hands;Cues for positioning of feet;Extra time to complete activity;Wheelchair management   Assist Level Substantial/maximal assistance   Equipment   (UE support on PT arms)   Stand to Sit   Patient Needs Assistance With Facilitation of weight shift;Balance;Cues for sequencing;Extra time to complete activity;Financial planner;Facilitation of hip flexion;Facilitation of trunk;Cues for positioning of hands   Assist Level Substantial/maximal assistance   Equipment   (UE support on PT arms)   Gait   Distance Distance 3   Gait Distance 1   Distance 15   Patient Needs Assistance With Facilitation of trunk;Cues for positioning of hands;Cues for step length;Balance;Cues for positioning of device;Cues to attend to environment   Assist Level Dependent   Equipment Roller walker;Wheelchair follow   Patterns Decreased gait velocity;Narrow base of support;Shuffling;Trunk lean forward   Deviation Left Decreased hip extension;Decreased knee flexion in swing;Decreased stride length;No heel strike/foot flat;Left scissoring   Deviation Right Decreased hip extension;Decreased knee flexion in swing;Decreased stride length;No heel strike/foot flat;Right scissoring   Comments Patient unable to follow cues for walker positioning and maintaining too far anterior and laterall   Gait Distance 2   Distance 15   Patient Needs Assistance With Extra time to complete the activity;Facilitation of weight shift;Facilitation of trunk;Cues for sequencing;Cues for step length;Balance   Assist Level Substantial/maximal assistance   Equipment Hand hold assist  (right)   Patterns Decreased gait velocity;Narrow base of support;Shuffling;Trunk lean forward   Deviation Left Decreased hip extension;Decreased knee flexion in swing;Decreased stride length;No heel strike/foot flat   Deviation Right Decreased hip extension;Decreased knee flexion in swing;Decreased stride length;No heel strike/foot flat   Gait Distance 3   Distance 20, 35  Patient Needs Assistance With Balance;Facilitation of hip extension;Facilitation of trunk   Assist Level Substantial/maximal assistance   Equipment   (UE support on PT arms)   Patterns Decreased gait velocity;Narrow base of support;Trunk lean forward;Shuffling Deviation Left Decreased hip extension;Decreased hip flexion;Decreased knee flexion in swing   Deviation Right Decreased hip extension;Decreased hip flexion;Decreased knee flexion in swing;Decreased stride length;Right scissoring;Right trunk lean   Comments PT walking backwards in front of patient to encourage trunk extension during gat.  Significant improvement when looking over PT shoulder, needs ongoing cues to perform   Therapeutic Interventions   Therapeutic Activities gait trials   Assessment   Assessment Patient tolerating session well.  Signficant difficulty managing walker and not achieving sufficient trunk support with hand held assist.  Patient with decreased assist needed and improved trunk position when PT ambulating backwards in front of patient.   Plan   PT Plan gait training (RW or UE support on PT while walking backwards with wheelchair follow vs consider vector to help with posture vs rifton), neurogym (consider using mirror for visual feedback), standing/reaching activities, trial stairs as appropriate, nustep for conditioning, outcome measures as appropriate   Recommendations   PT Discharge Recommendations Consistent supervision;Home setting with home health;Versus;Skilled nursing facility   PT Equipment Recommendations Too early to determine  (likely RW and wheelchair Web designer))         Therapist: Truman Caper, DPT, NCS  Doctor of Physical Therapy, Board Certified Specialist in Neurologic Physical Therapy    Date: 11/05/2023

## 2023-11-05 NOTE — Case Management (ED)
 CMA Note:       Check SNF benefits and emailed an in-network SNF list to Sebastian Matter, [facility] per her request.     SNF      Co pay - $0    Co ins - $0    Ded - $0    Max out of pocket - $9,350    Met - $257      Ref #[id number]      Dorthea Code  Case Management Assistant  For additional assistance please contact SWCM  *

## 2023-11-05 NOTE — Progress Notes
 SPEECH-LANGUAGE PATHOLOGY       11/05/23 0815   Behavior   Comments* Pt asleep in bed with RN present. RN reported receiving pain medication overnight and family initially present who also reported poor sleep. Pt lethargic and required max encouragement/alerting stimulation for participation throughout session. Pt demonstrated variable responsiveness to commands/questions. Notified interdisiplinary team   Dysphagia Goals   Therapy Activity Comments Attempted oral care via suction toothbrush and toothpaste while pt seated upright in bed. Pt initially agreeable to assistance with oral cares but following set up became resistant despite multiple attempts and extensive education. Later attempted setting out of bed and brushing teeth in the bathroom; however, pt continued to be noncompliant.   Attention Goals   Therapy Activity Comments Attempted to sit edge of bed and transfer to wheelchar. Attempted to facilitate communication of wants/needs and pain level, pt with variable responsiveness. Attempted to facilitate sequencing of oral cares and transfer, pt with limited response and unable to follow commands.         Assessment Session largely impacted by pt lethargy and overall resistance for participation.    Plan Cognition:  -- O-log  -- Simple attention/memory  -- Visual scanning  -- Medication management  -- Finance management     Dysphagia:  -- PO trials thin/mildly thick liquids, pureed solids  -- EMST-Lite  -- Swallow exercises as able  -- Consider IOPI        Meryle Slocumb, MS CCC-SLP

## 2023-11-05 NOTE — Rehab Team Conference
 Team Conference Note     Date of Admission:  10/28/2023  Date of Team Conference:  11/05/2023   Kevin Obrien is a 73 y.o. male.     DOB: 1950-08-05                  MRN#:  [id number]    Team Conference  Attendees: Alyce Crest, Attending Physician; Corean Brewster MD, Resident Physician; Clarita Hummer SW, Social Worker; Silvano Perry RN, Program Coordinator; Abigail Orchard, OTR, Occupational Therapy; Tereasa Free, DPT, Physical Therapy; Devon Pedro, SLP, Speech Therapy; Chiquita Emperor CTRS, Recreational Therapist; Leanna Sewer PharmD, Pharmacy; [name] RD LD, Clinical Nutrition; Marieta Ludwig RN, Nursing    Medical Update: Kevin Obrien is a 73 y.o. male admitted to rehabilitation for stroke with resultant impairments including weakness, visual impairment, bilateral horizontal gaze palsy, diplopia, L facial paralysis (bells palsy), R facial weakness (brainstem stroke), dysphagia, dysarthria .    Patient will continue to receive Physical therapy, Occupational therapy, and Speech therapy each 60 minutes a day, 5 days a week for the duration of the rehabilitation stay within an interdisciplinary rehabilitation program with rehab nursing and PM&R physician oversight.    Medical updates this week:   -endocrinology assistance for DM management planning  -continuing bolus feeds  -optimization of antihypertensive regimen  -ongoing treatment of oral thrush  -urinary retention- continuing to require ISC  -localized L TMJ pain, improved with tylenol and lidocaine patch    Medical barriers to discharge: Tube feeds plan and teaching. Diabetes management plan. Neurogenic bladder management plan- possible foley. There are no medical barriers to discharge regarding PICC line, anticoagulation, ongoing labs, appointment timing, new oxygen, new dialysis, wound vacuum.     Follow-up Appointments (list specialities, not appointment details): Neurology, PM&R, Cardiology      Team Goal:  Overall Team Goal  Patient Will Perform Basic Cares With: Partial/moderate assistance, Progressing  Patient Will Perform Household Mobility With: Partial/moderate assistance, At wheelchair level    Discharge Planning     Discharge Date:  11/19/2023    Patient lives with his significant other and 50 y.o. daughter in a single family home that accomodates 1 level living. 2 STE.  Patient is independent with all ADLs and IADLs at baseline.  DME: [facility]  Pt is self employed. Owns a cleaning business.  Girlfriend works outside of the home. Pt's adult's daughters work and can't provide consistent supervision. [name] does provide oversight and checks on him daily.  Per daughters, pt would not have consistent supervision. They asked about caregivers through insurance. Pt only has [place] Medicaid. For him to be assessed for Frail and Elderly Waiver he would need to have medical Medicaid. They would not be able to pay out of pocket. IF pt needs consistent supervision/assistance will need to go to SNF.    PT Discharge Recommendations: Consistent supervision, Skilled nursing facility  OT Discharge Recommendations: Consistent supervision, Skilled nursing facility  PT Equipment Recommendations: Too early to determine (likely RW and wheelchair Web designer))  OT Equipment Recommendations: Tub transfer bench  SLP DC Recommendations: consistent supervision; assistance with meds/finances; ongoing ST    Rehabilitation Plan     Progress  Weekly PT Progress: Improving standing tolerance and transfers. Ambulated 75' x6 with Rif[place].  Weekly OT Progress: Supportive family unit, progressing with LUE use during functional activities  ACE: 30/100  O-Log: 19/30; scores continue to fluctuate  Will consider memory book use but will likely require assistance  Benefits from verbal +  visual instruction for command following/sequencing; allow increased time for processing  Difficulty noted with coin ID from vision perspective with counting money task  Participatory in PO trials thin/mildly thick liquids, pureed solids; ongoing overt s/s aspiration  Anticipate VSS this week for further assessment  Oral cares throughout the day + ice chip protocol  LUE becoming more functional    Barriers/Concerns  Weekly PT Barriers/Concerns: Requiring Ax2 for transfers, Face/neck pain limiting over weekend.  Weekly OT Barriers/Concerns: Significant face/throat/neck pain limiting therapy participation this weekend, vision deficits  NPO  Unknown who will provide assistance with medication/finance management upon discharge  High burden of care   Bladder scans and sraight cath education  Pain to left ear and left Jaw,scheduled tylenol,oxy 5mg  and voltaren gel administered.  weakness,unsteady x2 serasteady  Vision deficits  30/100 on ACE  Increased time for processing    Plan  gait training (RW with wheelchair follow vs consider vector to help with posture vs rifton), neurogym (consider using mirror for visual feedback), standing/reaching activities, trial stairs as appropriate, nustep for conditioning, outcome measures as appropriate  LUE NMR, vision assessment (5/23), dynamic sitting balance, progress transfers, standing balance, standing for ADLs as able, all ADLs     Dietitian: Signs/symptoms of aspiration with advanced diet. VSS later this week.  Neuropsychology: Aggressive at times. Poor sleep. Reports he's going home. Very poor participation.   Therapeutic Recreation: Publishing copy. Listening to music. Going outside.    Weekly Team Goals  New to Modafinil   New insurance - work on SNF referral sooner rather than later    Caregiver Training Plan: Daughter at bedside  Can't provide level of physical support   - bladder plan?    Goals   Short Term Goals (Weekly)  Bed Mobility Goals: Patient will perform supine to sit with, Patient will perform sit to supine with  Patient Will Perform Supine to Sit With: Partial/moderate assistance  Patient Will Perform Sit to Supine With: Partial/moderate assistance  Transfer Goals: Patient will complete sit to and from stand transfer with  Patient Will Complete Sit to and From Stand Transfer With: Partial/moderate assistance  Patient Will Ambulate With: 50 feet, Partial/moderate assistance, With device  Patient Will Ascend/Descend: 4 steps, Dependence, 2 rails  Short Term Goals (Weekly)  ADL Goals: Toileting, Footwear, Lower body dressing  Patient Will Perform Toileting With: Partial/moderate assistance, Progressing  Patient Will Perform Footwear With: Partial/moderate assistance, Progressing  Patient Will Perform Lower Body Dressing With: Partial/moderate assistance, Progressing  Self-Care Transfers: All self-care transfers  Patient Will Perform All Self-Care Transfers With: Partial/moderate assistance, Progressing     Quality Indicators   Swallowing/Nutritional Status: Tube/parenteral feeding  Bladder Continence: Intermittent catheterization  Bowel continence: 3-Always incontinent    If you have questions about the above note, please talk to the Physical Medicine & Rehabilitation team or your Social Worker/Nurse Case Manager.    When applicable, [facility] will update your insurance representative weekly to ensure continuation of coverage throughout your stay. Any changes to your insurance authorization throughout your stay will be conveyed to you. The Health System Billing Department can be contacted Monday-Friday (7:30am-5:30pm) by phone at [phone] or [phone].

## 2023-11-05 NOTE — Case Management (ED)
 Case Management Progress Note    NAME:Kevin Obrien                          MRN: [id number]              DOB:21-Aug-1950          AGE: 73 y.o.  ADMISSION DATE: 10/28/2023             DAYS ADMITTED: LOS: 8 days      Today's Date: 11/05/2023    PLAN:   Discharge planning Obrien-going. Pt will not have consistent assistance at home. Will likely need SNF.    Expected Discharge Date: 11/19/2023   Is Patient Medically Stable: No, Please explain: Rehab goals  Are there Barriers to Discharge? no    INTERVENTION/DISPOSITION:  Discharge Planning                 Attended team conference. Therapy continues to work with pt and encourage him to participate with therapy.     SW met with pt and daughter, Kevin Obrien, at bedside.   Provided update from team conference. Discussed concern of pt not being able to tolerate the three hours of therapy. She is hopeful with the new medications will help him become more alert. SW provided a SNF list for them to review.  Pt was alert and oriented and talking during the visit. He voiced that he is trying to work with them and sometimes can't get up the energy or is having pain.   Discussed DPOAH. Pt doesn't want to complete one right now, but does say if he did need help making decisions it would be his daughters, Kevin Obrien and Kevin Obrien.    Transportation                 Support                 Info or Referral                 Positive SDOH Domains and Potential Barriers                   Medication Needs                                                                                                                                                         Estate manager/land agent                 Other                 Discharge Disposition  [name] [name], LMSW   Available Obrien voalte  [phone]

## 2023-11-05 NOTE — Progress Notes
 OCCUPATIONAL THERAPY  NOTE   Name: Kevin Obrien   MRN: [id number]     DOB: 05/01/1951      Age: 73 y.o.  Admission Date: 10/28/2023     LOS: 8 days     Date of Service: 11/05/2023         11/05/23 1330   Type of Note   Type of Note Daily   Time Calculation   Start Time 1330   Stop Time 1430   Time Calculation 60   $$ Phys ADL Skills 2 units   $$ Therapeutic Activity 2 unit - 30 min   Subjective   Subjective Pt presents seated in recliner chair with daughter/CM present. Pt agreeable to session.   Precautions   Comments L sided weakness, visual impairment (horizontal gaze palsy), PEG   Oral Hygiene   Assist Level Supervision or touching assistance   Position Sitting in chair   Comments Pt able to complete oral care while seated at sink with cueing intermittently for task continuity.   Toileting   Patient Needs Assistance With Adjusting clothing before toileting;Wiping self;Adjusting clothing after toileting;Balance   Assist Level Substantial/maximal assistance   Position Standing for pants management;Standing for wiping   Equipment Grab bar   Comments While OT provides assistance for balance in standing, patient able to manage clothing over hips with mod A and cueing for LUE use.   Toilet Transfer   Patient Needs Assistance With Facilitation of trunk;Facilitation of weight shift;Increased time to complete;Balance;Verbal cues;Transferring on;Transferring off   Transfer Type Stand pivot   Assist Level Partial/moderate assistance   Equipment Grab bar   Comments Patient able to stand min A from w/c and toilet with use of grab bar, completes pivot with cueing for attending to R side of body and step progression.   Daily Care   Patient continent of bladder? Yes, but no urine output   Bladder Management Comments Attempted time void with no output, RN aware.   Sit to and from Stand   Sit to and from Stand Sit to stand;Stand to sit   Sit to Stand   Patient Needs Assistance With Facilitation of hip extension;Facilitation of trunk;Facilitation of weight shift;Balance;Cues for sequencing;Cues for positioning of hands;Cues for positioning of feet;Extra time to complete activity;Wheelchair management   Assist Level Substantial/maximal assistance   Equipment Hand hold assist   Comments Pt able to stand from wheelchair x4 times with grossly mod A, intermittent max A due to poor safety and insight. Requires max cueing for body positioning prior to transfer.   Stand to Sit   Patient Needs Assistance With Facilitation of weight shift;Balance;Cues for sequencing;Extra time to complete activity;Financial planner;Facilitation of hip flexion;Facilitation of trunk;Cues for positioning of hands   Assist Level Substantial/maximal assistance   Equipment Hand hold assist   Chair/Bed-to-Chair Transfer   Chair/Bed-to-Chair Transfer Stand pivot   Stand Pivot Transfer   Patient Needs Assistance With Extra time to complete activity;Financial planner;Facilitation of hip flexion;Facilitation of trunk;Facilitation of weight shift;Cues for positioning of feet;Cues for positioning of hands;Cues for sequencing;Balance;Facilitation of hip extension   Assist Level Substantial/maximal assistance   Equipment Hand hold assist   Comments Blocked transfer practice. Pt completes x6 stand pivots: x3 w/c to mat to the L and x3 mat to w/c to the R. Patient able to complete pivots to the L with grossly min A hand held assist (pt hands on OT's elbows) with moderate verbal cueing for upright body positioning, step progression, maintenance of balance. During  SPT to the R, pt requires max A due to decreased safety and poor attention to task. Patient does not attend to cues on first two transfers, thus requires max A to safely return to w/c. Patient dmeos improved direction-following o nthird attempt, requiring only mod A to complete transfer.   Therapeutic Interventions   BITS Assessment: bells cancellation;Visual pursuits: rotator;Visual scanning: single target   Visual Scanning: Single Target Pt completes 2 single target activities x3 minutes while seated in chair in order to address visual scanning and BUE coordination. Pt persisitenyl over-undershooting target due to weakness/impaired coordination. Pt uses LUE to touch object in L visual field and RUE to touch object in R visual field. Pt benefits from proximal support at elbow in order to improve coordination and reach toward target near border of screen.   Visual Pursuits: Rotator Pt completes visual pursuit rotator x15 objects in 2:15 with 71.43% ccuracy and 8.98 reaction time. Pt reuqires consistent cueing for appropriate color of target.   Assessment: Bells Cancellation Unable to complete bells cancellation as pt is unbale to differentiate symbols.   Assessment   Assessment Pt is progressing toward goals. Demos improving functional transfers and toileting this session.   Plan   OT Plan LUE NMR, BUE coordination/strengthening, visual scanning, dynamic sitting balance, functional transfers, standing balance, standing for ADLs as able, all ADLs   Recommendations   OT Discharge Recommendations Consistent supervision;Home setting with home health;Versus;Skilled nursing facility   OT Equipment Recommendations Tub transfer bench         Therapist: Mariano Bellman, OTR/L  Date: 11/05/2023

## 2023-11-06 LAB — POC GLUCOSE
~~LOC~~ BKR POC GLUCOSE: 237 mg/dL — ABNORMAL HIGH (ref 70–100)
~~LOC~~ BKR POC GLUCOSE: 117 mg/dL — ABNORMAL HIGH (ref 70–100)
~~LOC~~ BKR POC GLUCOSE: 176 mg/dL — ABNORMAL HIGH (ref 70–100)
~~LOC~~ BKR POC GLUCOSE: 180 mg/dL — ABNORMAL HIGH (ref 70–100)

## 2023-11-06 MED ORDER — SODIUM ZIRCONIUM CYCLOSILICATE 10 GRAM PO PWPK
10 g | Freq: Once | ORAL | 0 refills | Status: DC
Start: 2023-11-06 — End: 2023-11-06

## 2023-11-06 MED ORDER — SODIUM ZIRCONIUM CYCLOSILICATE 10 GRAM PO PWPK
10 g | Freq: Once | GASTROSTOMY | 0 refills | Status: CP
Start: 2023-11-06 — End: ?
  Administered 2023-11-06: 17:00:00 10 g via GASTROSTOMY

## 2023-11-06 MED ORDER — DOXAZOSIN 2 MG PO TAB
3 mg | Freq: Every evening | GASTROSTOMY | 0 refills | Status: DC
Start: 2023-11-06 — End: 2023-11-12
  Administered 2023-11-07 – 2023-11-12 (×6): 3 mg via GASTROSTOMY

## 2023-11-06 MED ORDER — MODAFINIL 100 MG PO TAB
100 mg | Freq: Two times a day (BID) | ORAL | 0 refills | Status: DC
Start: 2023-11-06 — End: 2023-11-07
  Administered 2023-11-07: 14:00:00 100 mg via ORAL

## 2023-11-06 NOTE — Progress Notes
 PHYSICAL THERAPY  NOTE      Name: Kevin Obrien   MRN: [id number]     DOB: 1951/02/03      Age: 73 y.o.  Admission Date: 10/28/2023     LOS: 9 days     Date of Service: 11/06/2023         11/06/23 1400   Type of Note   Type of Note Daily   Time Calculation   Start Time 1400   Stop Time 1430   Time Calculation 30   $$ PT Therapeutic Activity 1 unit   $$ Neuromuscular Re-education 1 unit    Subjective   Subjective Patient in therapy gym in wheelchair at beginning session after eye exam. Pt glasses altered to assist with diplopia, used throughout therapy session. Pt appears withdrawn when therapist asks him what he wants to work on within session.   Precautions   Comments L sided weakness, visual impairment (horizontal gaze palsy), PEG   Cognitive   Patient Behavior Withdrawn  (On 5/28)   Sit to and from Stand   Sit to and from Stand Sit to stand;Stand to sit   Sit to Stand   Patient Needs Assistance With Facilitation of hip extension;Facilitation of trunk;Facilitation of weight shift;Balance;Cues for sequencing;Cues for positioning of hands;Extra time to complete activity;Wheelchair management   Assist Level Partial/moderate assistance   Comments Pt continues to require verbal cues for hand placement/trunk position/upright posture, but decreased compared to morning attempts. At times able to complete with CGA from mat at wheelchair height, but continuing to require mod assist standing from wheelchair   Stand to Sit   Patient Needs Assistance With Facilitation of weight shift;Balance;Cues for sequencing;Extra time to complete activity;Financial planner;Facilitation of hip flexion;Facilitation of trunk;Cues for positioning of hands   Assist Level Partial/moderate assistance   Comments Continues to require cues for reaching back for sitting surface, but improving sequencing.   Stand Pivot Transfer   Patient Needs Assistance With Extra time to complete activity;Financial planner;Facilitation of hip flexion;Facilitation of trunk;Facilitation of weight shift;Cues for positioning of feet;Cues for positioning of hands;Cues for sequencing;Balance;Facilitation of hip extension   Assist Level Partial/moderate assistance   Equipment Hand hold assist   Comments Pt completes x3 throughout session, requires physical/verbal cues for proper hand placement and requires increased assist for sit to stand portion of activity, but improving sequencing of activity including reaching for surface/arm of chair particularly when transferring back to wheelchair and with improved foot placement with decreasing cues.   Actor 1   Distance 1 From gym to room   Type Ultralight wheelchair   Patient Needs Assistance With Turns;Path navigation;Propulsion   Assist Level Substantial/maximal assistance   Propulsion Method Bilateral upper extremity;Bilateral lower extremity  (Bout with UEs; bout of LEs)   Comments Pt continues to demonstrate small bouts of propulsion even with demonstrations and verbal cueing. Switched to LE propolsion with improved speed/ability to propel wheelchair but requires max verbal cues and is unable to propel in straight line or move through door ways   Therapeutic Interventions   Therapeutic Activities Wheelchair propulsion   Neuromuscular Reeducation Progressed sit to stand reps from mat surface at wheelchair height without neurogym. completed x5 reps, but slow and seated breaks taken by patient in between each. Pt able to push off with BUEs from seated surface, but requires assist to maintain upright standing posture. Attempted mirror for visual cueing with mild improvements, recommend to  attempt with mirror in future sessions.   Assessment   Assessment Pt with improved sit to stands, at times CGA with verbal cues without neurogym. Pt trialed ultralight wheelchair with improved propolsion BLEs > BUEs and easier to see/manage breaks. Plan   PT Plan gait training (RW or UE support on PT while walking backwards with wheelchair follow vs consider vector to help with posture vs rifton), neurogym for higher reps (consider using mirror for visual feedback), standing/reaching activities, trial stairs as appropriate, nustep for conditioning, outcome measures as appropriate, continued wc ultralight propulsion         Therapist: Morene Greet, PT, DPT [id number]   Date: 11/06/2023

## 2023-11-06 NOTE — Progress Notes
 SPEECH-LANGUAGE PATHOLOGY     11/06/23 1100   Behavior   Comments* Pt upright in wc in the gym upon start of session, alert and agreeable for treatment. Pt reports feeling good today and getting better sleep last PM. Pt transferred to dining room for session.   Dysphagia Goals   Therapy Activities PO trials   Swallow exercises Lingual isometric exercises ([place]  oral performance instrument-IPOI)   PO Trials Thin liquids;Mildly thick liquids;Puree   Therapy Activity Comments Pt provided with PO trials ice chips x2, thin liquids via controlled volume sips (cup/straw), mildly thick liquids via controlled volumes (cup/straw), and pureed solids. All liquid trials provided via 5cc medicine cup. No L-sided anterior loss observed this date. Ongoing suspicion for premature spillage + delayed swallow initiation based upon observation and previous FEES however subjectively appeared to be slightly improved w/ mildly thick liquids. Verbal cues for swallow initiation with pureed solids, frequently tilted his head back to support A-P transit. Intermittent vocal wetness noted, benefited from occasional cues for throat clear. Noted immediate wet coughing with all trials of thin. Immediate cough with mildly thick liquids noted in 1/7 trials during the last presentation. No overt s/s of aspiration noted with puree solids.     Utilized [place]  Oral Performance Instrument (IOPI) - Attempted ongoing use of IOPI to target lingual strength; however, pt presented with increased fatigue and overall difficulty for accurate repetitions. Will continue to trials as able.      Problem Solving Goals   Therapy Activities Sequencing   Therapy Activity Comments Pt engaged in functional sequencing for toileting and transfers with trained SLP and RN. Pt continues to require cues for hand/foot placement via Camie Ip and intermittent cues for sequencing. Ongoing physical assist required. Pt demonstrating emerging sequencing and carryover of techniques. Assessment Pt is progressing towards functional therapy goals.    Plan Cognition:  -- O-log  -- Simple attention/memory  -- Visual scanning  -- Medication management  -- Finance management     Dysphagia:  -- PO trials thin/mildly thick liquids, pureed solids  -- EMST-Lite  -- Swallow exercises as able  -- Continue IOPI  -- VSS 5/29      Meryle Slocumb, MS CCC-SLP

## 2023-11-06 NOTE — Progress Notes
 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  male admitted to [facility] on 10/28/2023 with the following issues: stroke      Rehabilitation Plan  Tentative discharge date: 11/19/2023  Rehabilitation: Patient will continue with comprehensive therapies including physical therapy, occupational therapy, speech & language pathology, specialized rehab nursing, neuropsychology and physiatry oversight.    Goals:  Patient Will Perform Basic Cares With: Partial/moderate assistance, Progressing  Patient Will Perform Household Mobility With: Partial/moderate assistance, At wheelchair level  Recommended therapy after discharge: Consistent supervision, Home setting with home health, Versus, Skilled nursing facility  PT recommended equipment: Too early to determine (likely RW and wheelchair Web designer))  OT recommended equipment: Tub transfer bench    Daily Functional Update:  Stand to Sit Assist Level: Substantial/maximal assistance (11/05/2023  2:45 PM)  Equipment: -- (UE support on PT arms) (11/05/2023  2:45 PM)     Stand Pivot Assist Level: Substantial/maximal assistance (11/05/2023  1:30 PM)  Equipment: Hand hold assist (11/05/2023  1:30 PM)     Slideboard No data recorded  No data recorded   Gait Distance: 15 (11/05/2023  2:45 PM)    Assist Level: Dependent (11/05/2023  2:45 PM)    Equipment: Roller walker; Wheelchair follow (11/05/2023  2:45 PM)     Wheelchair No data recorded  No data recorded   Toileting       Assist Level: Substantial/maximal assistance (11/05/2023  1:30 PM)  Equipment: Grab bar (11/05/2023  1:30 PM)     Toilet Transfer No data recorded   Upper Body Dressing Assist Level: Substantial/maximal assistance (10/29/2023  1:00 PM)     Lower Body Dressing Assist Level: Substantial/maximal assistance (10/29/2023  1:00 PM)       Diet: PO: Ice chips only (10/29/2023  9:15 AM)      Orientation Log & Cognitive Log Scores for the past 72 hrs (Last 3 readings):   Orientation Log Score (Out of 30)   11/03/23 0930 19/30           Current Medical Problems/Risks of Medical Complications/Management    Acute Right Pontomedullary Ischemic Stroke  Proximal Right Intracranial Vertebral Artery Occlusion  Diffuse Intracranial Atherosclerosis  Bilateral Horizontal Gaze Palsy  History  of Forced Left Gaze Deviation  Left Facial Palsy  Impairments: left hemiparesis, expressive aphasia, oropharyngeal dysphagia, right facial weakness, left facial droop with dysarthria, diplopia, concern for attention/cognitive impairments, LUE ataxia, neurogenic bowel, neurogenic bladder  Impaired mobility and ADL, impaired swallow and cognition/communication               > consult PT/OT/SLP and neuropsychology to evaluate and treat               > Increase frequency of Modafinil to 100mg  BID (0800, 1200) starting 5/29   > Zoloft 50mg  daily               > Sleep optimization: melatonin 5mg  qhs, previously on seroquel nightly               > follow up: Neurology, PM&R, Cardiology     Daytime Sleepiness  Fatigue  Increased Somnolence - Resolved  - Concern for increased somnolence 5/20 leading to Neurology Consult- rec CTA H&N; discussed with team may attempt to liberalize BP I.e. decreasing amlodipine to 5mg  due to symptoms possibly 2/2 to hypoperfusion  - CTA Head and Neck - negative for acute change  - Infectious workup including UA, CXR- negative for acute concerns  > appreciate further neurology recommendations- if acute focal weakness, numbness, coma, etc. Stroke activate/send to ED as he is high risk to have progression of his stroke/basilar involvement. Signed off.     Hyperkalemia  - K 5.2 5/28 AM   > lokelma x1 5/28   > fu BMP 5/29     Secondary Stroke Risk Optimization  > Antiplatelet/Anticoagulation: DAPT with aspirin 81mg  daily and clopidogrel 75mg  daily for 90 days followed by aspirin 81mg  indefinitely  > Blood Pressure Goal: <160 during acute hospitalization, long term goal of < 130/80               - Current antihypertensive medications: amlodipine 5mg  daily, lisinopril 20mg  daily, metoprolol 25mg  BID  > Lipids: LDL is 161, with goal of <70: rosuvastatin 40mg  daily (per Cardiology recs)  > Blood Glucose: Hgb A1c is 11.7%, with goal of <7  > Tobacco abstinence goal     At risk for Spasticity               > consider trial of baclofen if indicated, discuss potential benefits and side effects               > daily ROM with therapy      Dysphagia s/p PEG tube placement  Moderate Malnutrition  - PEG tube placement 5/15               > Dietician consult, appreciate tube feed recommendations- transition from continuous to bolus feeds 5/19               Recommend Jevity 1.5 bolus feeds, 5 cartons/day + 1 prosource pkt.     Recommend 1/2 carton per bolus for first 1-2 feeds, then proceed to goal regimen.    This will provide 1857kcal, 96g protein, free H2O.    Recommend 30mL free H2O flush before and after each bolus + 100mL q4h to meet fluid needs.   To help with loose stools, before interrupting EN support:    Avoid sorbitol containing medications such as liquid tylenol as these can make diarrhea worse; recommend switching to crushable tablet form.    Consider probiotic.    If loose stools persist, recommend adding 1-2 packets of nutrisource fiber per  day.      Left Bell's Palsy  Left TMJ pain - Improved 5/28  Ear Wax B/L ear - Improved 5/28               > Alternate eye patches; eyepatch for the L eye overnight, artificial tears TID daily in the setting of left facial palsy   > lidocaine patch and tylenol to be continued as pt reports these improve left sided jaw pain   > Debrox added     Suicidal Ideation  Pseudobulbar Affect  > Per Neurology: Consider starting Dextromethorphan-Quinidine in the outpatient setting (unfortunately unavailble on the formulary inpatient) for Pseudobulbar affect, will start Fluoxetine 20mg  in the meantime --> instead was started on Sertraline 50mg , will continue while in rehab  > Neurology clinic follow up on dc  > Per neuropsychology eval on 5/28: Pt?s reported mood was sad. Pt endorsed sadness and passive SI and also made a gesture of shooting himself. He reported that the last time he thought about it was yesterday. Will place psychiatry consult for SI and CO for safety. Neuropsychology plans to re-eval tomorrow.      Oral Thrush               > oral care TID   > Nystatin swish and spit started 5/20, ok'd by SLP     Bowel Management  At risk for Neurogenic Bowel  Loose stools (resolved)  Constipation  -Last Bowel Movement Date: 11/05/23 (11/05/2023  8:30 PM)  - Goal for a daily BM or every 24-48 hours.   - c diff negative 5/18  > adjust bowel regimen PRN   > loperamide PRN  > Senna 2 tabs QHS via g tube  > MiraLax daily via g tube     Bladder Management  Neurogenic Bladder with Retention       -Post-Void (PVR) Bladder Scan (mL): 20 milliliters (11/05/2023  8:15 PM)  -Straight Cath (mL): 450 (11/06/2023  4:00 AM)  > Monitor urinary retention per unit protocol. If unable to void or PVRs >300cc perform clean ISC (intermittent straight catheterization) for volumes > . Stop checking BVIs if 3 consecutive volumes <150cc.   > Consider foley placement  >  doxazosin 3mg  QHS (last increase 5/28)     Hypertension  Orthostatic Hypotension  LVOT Obstruction  PFO  CAD s/p PCI               > metoprolol 25mg  BID               > amlodipine 5mg  daily, lisinopril 20mg  daily               > 30 day MCOT    > compression stockings to prevent sx orthostatic hypotension               > Follow up with outpatient Cardiology      Diabetes Mellitus               > Endocrinology consult for ongoing discharge regimen planning     Depression/Mood screen :          > neuropsychology to evaluate and treat for adjustment to new deficits          > Encouragement and support   > on zoloft 50mg  daily    Aspiration Pneumonia - Resolved  S/p AHRF  - increased O2 req overnight 5/13 with elevated procal, tachycardia, leukocytosis  - CXR 5/13 notable for mild basal opacities, favored  to represent atelectasis  - s/p vanc x1 and zosyn x3 days  - sputum cx NGTD  - Augmentin to complete total 7 day course of antibiotics through 5/20 AM    Nutrition:   Body mass index is 23.37 kg/m?SABRA  Malnutrition Details:  Malnutrition present on admission  ICD-10 code E44: Chronic illness/Moderate non-severe malnutrition      Mild loss of muscle mass, Mild loss of body fat, Energy intake: Less than 75% of estimated energy requirement for 1 month or more        Loss of Subcutaneous Fat: Yes Mild Orbital, Triceps  Muscle Wasting: Yes Moderate Clavicle, Temple, Deltoid               Malnutrition Interventions: EN recs to meet 100% estimated kcal and protein needs    Wounds:        DVT ppx: enoxaparin (LOVENOX) syringe 40 mg  QDAY(21); patient will not require at the time of discharge    Code Status: Full Code    Current Medications:  Scheduled Meds:acetaminophen (TYLENOL) tablet 650 mg, 650 mg, Feeding Tube, Q6H*  amLODIPine (NORVASC) tablet 5 mg, 5 mg, Per NG tube, QDAY  artificial tears (PF) single dose ophthalmic solution 1 drop, 1 drop, Left Eye, TID  aspirin chewable tablet 81 mg, 81 mg, Per NG tube, QDAY  carbamide peroxide (DEBROX) 6.5 % otic solution 5 drop, 5 drop, Both Ears, BID  clopiDOGreL (PLAVIX) tablet 75 mg, 75 mg, Per NG tube, QDAY  diclofenac sodium (VOLTAREN) 1 % topical gel 2 g, 2 g, Topical, QID  Diet Enteral Feeding Bolus, , SEE ADMIN INSTRUCTIONS, QID (3,87,83,79)   And  Water Bolus, 100 mL, PEG Tube, QID (3,87,83,79)  doxazosin (CARDURA) tablet 2 mg, 2 mg, Feeding Tube, QHS  enoxaparin (LOVENOX) syringe 40 mg, 40 mg, Subcutaneous, QDAY(21)  insulin aspart (U-100) (NOVOLOG FLEXPEN U-100 INSULIN) injection PEN 0-12 Units, 0-12 Units, Subcutaneous, QID  insulin aspart (U-100) (NOVOLOG FLEXPEN U-100 INSULIN) injection PEN 14 Units, 14 Units, Subcutaneous, QID  insulin glargine (LANTUS SOLOSTAR U-100 INSULIN) injection PEN 18 Units, 18 Units, Subcutaneous, QDAY  lidocaine (LIDODERM) 5 % topical patch 1 patch, 1 patch, Topical, QDAY  lisinopriL (ZESTRIL) tablet 20 mg, 20 mg, Feeding Tube, QDAY  melatonin tablet 5 mg, 5 mg, Feeding Tube, QHS  metoprolol tartrate tablet 25 mg, 25 mg, Feeding Tube, BID  modafiniL (PROVIGIL) tablet 100 mg, 100 mg, Oral, QDAY  nystatin (MYCOSTATIN) oral suspension 500,000 Units, 500,000 Units, Swish & Spit, QID  petrolatum (STYE) ophthalmic ointment 0.25 inch, 0.25 inch, Both Eyes, TID  polyethylene glycol 3350 (MIRALAX) packet 17 g, 1 packet, Per G Tube, QDAY  rosuvastatin (CRESTOR) tablet 40 mg, 40 mg, Feeding Tube, QDAY  senna (SENOKOT) tablet 2 tablet, 2 tablet, Per G Tube, QHS  sertraline (ZOLOFT) tablet 50 mg, 50 mg, Feeding Tube, QDAY  sodium zirconium cyclosilicate (LOKELMA) packet 10 g, 10 g, Feeding Tube, ONCE  thiamine mononitrate (vit B1) tablet 100 mg, 100 mg, Per NG tube, QDAY    Continuous Infusions:  PRN and Respiratory Meds:bisacodyL QDAY PRN, dextrose 50% (D50) IV PRN, diclofenac sodium QID PRN, lidocaine hcl PRN, loperamide TID PRN, oxyCODONE BID PRN, pancrelipase 20,880 Units/sodium bicarbonate 650 mg ([place] CLOG DESTROYER) PRN (On Call from Rx), phenoL PRN      Subjective     No acute overnight evetns. Patient seen at bedside with daughter at bedside. Pt reports ongoing left jaw point, pointing right into TMJ. Daughter reports family has noticed he  is more sensitive to sound and has fluctuating fatigue, sleep wake cycle disruption. Pt continues to require ISC with doxazosin. Last bowel movement 5/26.    Objective                          Vital Signs: Last Filed                 Vital Signs: 24 Hour Range   BP: 125/55 (05/28 0449)  Temp: 36.8 ?C (98.3 ?F) (05/28 0449)  Pulse: 69 (05/28 0449)  Respirations: 18 PER MINUTE (05/28 0449)  SpO2: 97 % (05/28 0449)  O2 Device: None (Room air) (05/28 0449) BP: (125-126)/(55-56)   Temp:  [36.8 ?C (98.3 ?F)]   Pulse:  [65-69]   Respirations:  [18 PER MINUTE]   SpO2:  [97 %-98 %]   O2 Device: None (Room air)   Intensity Pain Scale (Self Report): 7 (11/05/23 2043) Vitals:    10/28/23 1324 10/30/23 1511 11/04/23 0401   Weight: 59.7 kg (131 lb 9.8 oz) 59.4 kg (131 lb) 63.7 kg (140 lb 6.9 oz)       Intake/Output Summary:  (Last 24 hours)    Intake/Output Summary (Last 24 hours) at 11/06/2023 0916  Last data filed at 11/06/2023 0400  Gross per 24 hour   Intake 1366 ml   Output 1210 ml   Net 156 ml        Last Bowel Movement Date: 11/05/23    Labs--reviewed  Results for orders placed or performed during the hospital encounter of 10/28/23 (from the past 24 hours)   POC GLUCOSE    Collection Time: 11/05/23 12:15 PM   # # Low-High    Glucose, POC 166 (H) 70 - 100 mg/dL   POC GLUCOSE    Collection Time: 11/05/23  4:44 PM   # # Low-High    Glucose, POC 193 (H) 70 - 100 mg/dL   POC GLUCOSE    Collection Time: 11/05/23  8:36 PM   # # Low-High    Glucose, POC 237 (H) 70 - 100 mg/dL   POC GLUCOSE    Collection Time: 11/06/23  6:14 AM   # # Low-High    Glucose, POC 117 (H) 70 - 100 mg/dL   CBC CELLULAR THERAPEUTICS    Collection Time: 11/06/23  6:24 AM   # # Sharleen    White Blood Cells 9.60 4.50 - 11.00 10*3/uL    Red Blood Cells 4.13 (L) 4.40 - 5.50 10*6/uL    Hemoglobin 12.5 (L) 13.5 - 16.5 g/dL    Hematocrit 65.1 (L) 40.0 - 50.0 %    MCV 84.3 80.0 - 100.0 fL    MCH 30.2 26.0 - 34.0 pg    MCHC 35.9 32.0 - 36.0 g/dL    RDW 87.1 88.9 - 84.9 %    Platelet Count 235 150 - 400 10*3/uL    MPV 9.7 7.0 - 11.0 fL   BASIC METABOLIC PANEL CELLULAR THERAPEUTICS    Collection Time: 11/06/23  6:24 AM   # # Low-High    Sodium 133 (L) 137 - 147 mmol/L    Potassium 5.2 (H) 3.5 - 5.1 mmol/L    Chloride 97 (L) 98 - 110 mmol/L    Glucose 113 (H) 70 - 100 mg/dL    Blood Urea Nitrogen 26 (H) 7 - 25 mg/dL    Creatinine 9.25 9.59 - 1.24 mg/dL    Calcium 9.3 8.5 - 89.3 mg/dL  CO2 27 21 - 30 mmol/L    Anion Gap 9 3 - 12    Glomerular Filtration Rate (GFR) >60 >60 mL/min         Physical Exam  General: no distress, seen lying in bed comfortably on room air, seemingly fatigued/sleepy  HEENT: oral mucosa moist; minimal tenderness to palpation over the area over TMJ  Heart: Extremities warm and well perfused  Lungs: Normal work of breathing on room air  Abdomen: Soft, non distended, no tenderness to palpation, +G tube without erythema or drainage  Extremities: Peripheral edema is not present  Psych: mood and affect seemingly congruent  Neuro: Fatigue noted this AM however arousable to voice and follows simple commands. At least antigravity shoulder abduction bilaterally. Hypophonic and dysarthric. Left facial paralysis, bilateral horizontal gaze palsy appreciated.       Labs & Therapy Notes Reviewed    Corean CHRISTELLA Brewster, DO   Physical Medicine & Rehabilitation  For patient care questions contact Rehab Medicine 3 FIRST CALL via Voalte.

## 2023-11-06 NOTE — Progress Notes
 OCCUPATIONAL THERAPY  NOTE   Name: Kevin Obrien   MRN: [id number]     DOB: October 02, 1950      Age: 73 y.o.  Admission Date: 10/28/2023     LOS: 9 days     Date of Service: 11/06/2023         11/06/23 0830   Type of Note   Type of Note Daily   Time Calculation   Start Time 0830   Stop Time 0930   Time Calculation 60   $$ Phys ADL Skills 4 units   Subjective   Subjective Pt seated in bed on arrival/exit with alarm on and needs met/in reach   Pain   MAR Pain Scale 0-10   Intensity Pain   Intensity Pain Scale (Self Report) 9   Words to Describe Pain Headache   Location of Pain Head   Location Pain Orientation Right   Pain Intervention(s) Other (Comment)  (RN notified)   Precautions   Comments L sided weakness, visual impairment (horizontal gaze palsy), PEG   Oral Hygiene   Patient Needs Assistance With Balance;Applying toothpaste;Closing toothpaste   Assist Level Partial/moderate assistance   Position Sitting in chair   Comments Pt requesting assistance to open/close toothpaste; assist for trunk control while seated in wheelchair   Lower Body Dressing   Patient Needs Assistance With Underwear: threading or unthreading RLE;Underwear: threading or unthreading LLE;Pants: threading or unthreading RLE;Pants: threading or unthreading LLE;Balance   Assist Level Substantial/maximal assistance   Position Standing only to pull pants over hips   Footwear   Patient Needs Assistance With Socks: applying or removing right;Socks: applying or removing left   Assist Level Dependent   Position Sitting edge of bed   Comments Pt declining to attempt participation in task   Toileting   Patient Needs Assistance With Adjusting clothing before toileting;Wiping self;Adjusting clothing after toileting;Balance   Assist Level Substantial/maximal assistance   Position Standing for pants management;Standing for wiping   Equipment Grab bar   Toilet Transfer   Patient Needs Assistance With Facilitation of trunk;Facilitation of weight shift;Increased time to complete;Balance;Verbal cues;Transferring on;Transferring off   Transfer Type Stand pivot   Assist Level Partial/moderate assistance   Equipment Grab bar   Daily Care   Bladder Management Comments Attempted timed void, no output   Patient continent of bowel? No   Device Absorbent pad   Accident  - Bowel Small   Stool Amount Smear   Stool Appearance Soft   Stool Color Brown   Last Bowel Movement Date 11/06/23   Sit to Lying   Patient Needs Assistance With Bilateral lower extremities   Assist Level Partial/moderate assistance   Equipment With rails;HOB elevated   Comments MinA   Lying to Sitting on Side of Bed   Patient Needs Assistance With Trunk;Extra time to complete the activity   Assist Level Partial/moderate assistance   Equipment With rails;HOB elevated   Comments increased time to complete, MinA   Sit to and from Stand   Sit to and from Stand Sit to stand;Stand to sit   Sit to Stand   Patient Needs Assistance With Facilitation of hip extension;Facilitation of trunk;Facilitation of weight shift;Balance;Cues for sequencing;Cues for positioning of hands;Cues for positioning of feet;Extra time to complete activity;Wheelchair management   Assist Level Partial/moderate assistance   Equipment Hand hold assist   Comments Verbal and tactile cues for stepping with R/L foot. Increased time to complete   Stand to Sit   Patient Needs Assistance With Facilitation  of weight shift;Balance;Cues for sequencing;Extra time to complete activity;Financial planner;Facilitation of hip flexion;Facilitation of trunk;Cues for positioning of hands   Assist Level Partial/moderate assistance   Equipment Hand hold assist   Stand Pivot Transfer   Patient Needs Assistance With Extra time to complete activity;Financial planner;Facilitation of hip flexion;Facilitation of trunk;Facilitation of weight shift;Cues for positioning of feet;Cues for positioning of hands;Cues for sequencing;Balance;Facilitation of hip extension   Assist Level Substantial/maximal assistance   Equipment Hand hold assist   Comments bed>wheelchair <> toilet, wheelchair > bed; MaxA for verbal cueing and sequencing; Mod physical assist, pt holding on to OT elbows and laying head on OT chest   Assessment   Assessment Pt completes ADLs this session. Continues to require Min to Mod assist fro trunk control while seated, increased time and encouragement to participate/complete activities, and assist to complete stand pivot transfers. Continue progressing towards goals   Plan   OT Plan LUE NMR, BUE coordination/strengthening, visual scanning, dynamic sitting balance, functional transfers, standing balance, standing for ADLs as able, all ADLs   Recommendations   OT Discharge Recommendations Consistent supervision;Home setting with home health;Versus;Skilled nursing facility   OT Equipment Recommendations Tub transfer bench       Therapist: Phyliss Quale, OTD, OTR/L  Date: 11/06/2023

## 2023-11-06 NOTE — Progress Notes
 OCCUPATIONAL THERAPY       11/06/23 1300   Time Calculation   Start Time 1300   Stop Time 1400   Time Calculation 60   $$ Perf Test W/ Rpt/Rehab 4 units   Vision Screen   Visual History   (No significant history; reports diplopia began around 2 months ago)   Corrective Lenses Wears glasses for reading   Patient Visual Report Diplopia-horizontal;Diplopia-intermittent  (In the morning;)   Vision Assessment   Vision Assessment Completed Yes   Pupils WNL   Eyelids Impaired- left  (Able to passively close eyelid on L and maintain near closing. Pt using eye mask at night and reports it helping.)   Acuity Assessment Completed Yes   Corrective Lenses Yes  (Reading glasses and plano glasses with partial occlusion)   Bilateral Distance   (Trial with partial occlusion plano glasses; unable to identify any letters on Snellen Chart)   Bilateral Near 22/250  (Use of near vision chart with letters; trial of Lea Symbols and pt still with difficulty.)   Ocular Fixation Assessment Completed Yes   R Eye Steady  (Pt apperas to have discomfort during test; increased blinking and squinting with fixaxtion assessment.)   L Eye Ocular drift   Bilateral Eyes Steady  (Increased blinking on R side)   Ocular Pursuits Assessment Completed Yes   R Eye ROM restricted-right;ROM restricted-left;ROM restricted-inferior;ROM restricted-superior;Compensatory head movement when following target  (Pt with ability to complete some superior/inferior movements but unable to initiate movements horizontally. Completes with cueing for keeping eye open, continues to appear to have discomfort with testing.)   L Eye Choppy quality;Compensatory head movement when following target;ROM restricted-right;ROM restricted-left;ROM restricted-inferior;ROM restricted-superior   Bilateral Eyes Compensatory head movement when following target;ROM restricted-right;ROM restricted-left;ROM restricted-superior;ROM restricted-inferior;Choppy quality  (Unable to complete without significant head compensation; difficulty with horizontal eye movement initiation)   Ocular Saccades Assessment Completed Yes   R Eye Horizontal   (Not assessed d/t pt with decreased lateral movements)   L Eye Horizontal   (Not assessed d/t inability to complete lateral movements)   Bilateral Eyes Horizontal   (Unable to complete as patient unable to initiate horizontal eye movements)   R Eye Vertical Corrective movements;Decreased speed of movement;Additional head turns   L Eye Vertical Decreased speed of movement;Corrective movements;Additional head turns   Bilateral Eyes Vertical Decreased speed of movement;Additional head turns;Corrective movements  (Initially able to obtain vertical targets; unable to complete testing throughout d/t pt limitations with sequencing for task)   Convergence Impaired  (Unable to keep R eye open during testing with movements in/out. Reports diplopia of target at initial spot of testing.)   Unilateral Cover Test Completed Yes   L Eye WNL   R Eye WNL  (Unable to keep R eye open with occlusion of L eye)   Alternate Cover Test Completed Yes   L Eye WNL   R Eye   (Difficulty maintaining eye open during testing)   Corneal Alignment WNL No   R Eye Deviation In   Visual Fields Assessment Completed Yes   R Eye Impaired - inferior;Impaired - medial;Impaired - lateral  (Questionable historian during assessment; noted pt to see target intermittently prior to saying but decreased processing in reporting seeing of target.)   L Eye Impaired - superior;Impaired - lateral;Impaired - inferior  (Pt appears limited by processing speed for testing of visual fields)   Assessment Testing limited throughout by 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 Pt tolerates testing with increased  time for processing and rest breaks. Pt with c/o diplopia consistent with impaired corneal alignment. Unable to assess acuity with reading glasses or far vision, question language vs processing impacting results. Pt will benefit from ongoing training/HEP for vision including eye taping strategies, partial occlusion glasses training, and education on compensatory strategies for decreased horizontal movements. Pt will benefit from follow up with neuro vision specialist.   Vision Plan Allow breaks during visually demanding tasks;Enlarge font;Tape (R/L) eye shut when resting/sleeping;Use of eyedrops;Educate patient/family on vision HEP;Identify optimal scanning pattern;Follow-up with neuro-vision specialist;Encourage use of partial occlusion glasses;Encourage use of corrective lenses;Use optimal lighting  (Decreased lighting for comfort; taping eye shut vs patching at night/when showering.)     Hermon Last, OTR/L [phone]

## 2023-11-06 NOTE — Progress Notes
 PHYSICAL THERAPY  NOTE      Name: Kevin Obrien   MRN: [id number]     DOB: 1950/10/20      Age: 73 y.o.  Admission Date: 10/28/2023     LOS: 9 days     Date of Service: 11/06/2023         11/06/23 1015   Type of Note   Type of Note Daily   Time Calculation   Start Time 1015   Stop Time 1045   Time Calculation 30   $$ PT Therapeutic Activity 1 unit   $$ Neuromuscular Re-education 1 unit    Subjective   Subjective Patient sleeping upon room entry, requiring therapist to wake up, but agreeable to therapy session. Pt verbalizes continuing jaw/neck discomfort but improved compared to yesterday. Pt left up in wheelchair for speech therapy.   Precautions   Comments L sided weakness, visual impairment (horizontal gaze palsy), PEG   Rolling Left and Right   Patient Needs Assistance With Trunk;Assist of one   Assist Level Partial/moderate assistance   Equipment With rails   Lying to Sitting on Side of Bed   Patient Needs Assistance With Trunk;Extra time to complete the activity;Assist of one   Assist Level Partial/moderate assistance   Equipment With rails;HOB elevated   Comments Pt able to independently move BLE off EOB, and able to partially sit upright independently before requiring assist for trunk to sit fully upright. Pt requires physical assist to move hips closer to EOB after multiple attempts independently. Pt bed mobility improving. Minimal assist   Sit to and from Stand   Sit to and from Stand Sit to stand;Stand to sit   Sit to Stand   Patient Needs Assistance With Facilitation of hip extension;Facilitation of trunk;Facilitation of weight shift;Balance;Cues for sequencing;Cues for positioning of hands;Cues for positioning of feet;Extra time to complete activity;Wheelchair management   Assist Level Partial/moderate assistance   Comments Pt requires consistent verbal cues for hand placement, but with good carryover from last session and rep to rep in session. Pt requires varying levels of assist from CGA from plinth in gym to mod assist from wheelchair in room.   Stand to Sit   Patient Needs Assistance With Facilitation of weight shift;Balance;Cues for sequencing;Extra time to complete activity;Financial planner;Facilitation of hip flexion;Facilitation of trunk;Cues for positioning of hands   Assist Level Partial/moderate assistance   Comments Cues to reach back for surface this date, requires cues for hand placement, varying levels of assist on varying surfaces, CGA from plinth vs min to wheelchair   Theatre manager distance 1   Wheelchair Distance 1   Distance 1 From room to therapy gym   Type Standard wheelchair   Patient Needs Assistance With Turns;Path navigation;Propulsion   Assist Level Substantial/maximal assistance   Propulsion Method Bilateral upper extremity   Comments Pt with very small bouts of propolsion requiring assist from therapist, cues for hand placement and cues for larger propolsions with poor carryover.   Therapeutic Interventions   Neuromuscular Reeducation Neurogym: Sit to stand repetitions; Completed x5 STS reps with 20# pulling up with UEs; x5 STS reps with 10# pulling up with UEs; x3 STS with 10#s and pushing through plinth surface before pt requests to stop. Pt completed from mat from wheelchair height.   Assessment   Assessment Pt tolerated session well, improving bed mobility from flat surface, but continuing to require assist for trunk and scooting hips to EOB to sit fully upright.  Pt continues to requires assist x2 for toileting, but transfering with decreased assist. Pt demonstrates improved sit to/from stand from standard wheelchair height surface, but requires cues for hand placement/upright posture for all steps of activity.   Plan   PT Plan gait training (RW or UE support on PT while walking backwards with wheelchair follow vs consider vector to help with posture vs rifton), neurogym for higher reps (consider using mirror for visual feedback), standing/reaching activities, trial stairs as appropriate, nustep for conditioning, outcome measures as appropriate   Recommendations   PT Discharge Recommendations Consistent supervision;Home setting with home health;Versus;Skilled nursing facility   PT Equipment Recommendations Too early to determine  (likely RW and wheelchair Web designer))         Therapist: Morene Greet, PT, [place] [id number]   Date: 11/06/2023

## 2023-11-07 ENCOUNTER — Inpatient Hospital Stay: Admit: 2023-11-07 | Discharge: 2023-11-07 | Payer: MEDICARE

## 2023-11-07 LAB — POC GLUCOSE
~~LOC~~ BKR POC GLUCOSE: 282 mg/dL — ABNORMAL HIGH (ref 70–100)
~~LOC~~ BKR POC GLUCOSE: 124 mg/dL — ABNORMAL HIGH (ref 70–100)
~~LOC~~ BKR POC GLUCOSE: 175 mg/dL — ABNORMAL HIGH (ref 70–100)

## 2023-11-07 MED ORDER — BARIUM SULFATE 81 % (W/W) PO POWD
10 mL | Freq: Once | ORAL | 0 refills | Status: CP
Start: 2023-11-07 — End: ?
  Administered 2023-11-07: 19:00:00 10 mL via ORAL

## 2023-11-07 MED ORDER — BARIUM SULFATE 40 % (W/V) PO SUSP
10 mL | Freq: Once | ORAL | 0 refills | Status: CP
Start: 2023-11-07 — End: ?
  Administered 2023-11-07: 19:00:00 10 mL via ORAL

## 2023-11-07 MED ORDER — MODAFINIL 100 MG PO TAB
100 mg | Freq: Two times a day (BID) | ORAL | 0 refills | Status: DC
Start: 2023-11-07 — End: 2023-11-14
  Administered 2023-11-07 – 2023-11-14 (×15): 100 mg via ORAL

## 2023-11-07 MED ORDER — BARIUM SULFATE 40 % (W/V), 30% (W/W) PO PSTE
10 mL | Freq: Once | ORAL | 0 refills | Status: CP
Start: 2023-11-07 — End: ?
  Administered 2023-11-07: 19:00:00 10 mL via ORAL

## 2023-11-07 MED ORDER — MODAFINIL 100 MG PO TAB
100 mg | Freq: Two times a day (BID) | ORAL | 0 refills | Status: DC
Start: 2023-11-07 — End: 2023-11-07

## 2023-11-07 MED ORDER — OXYCODONE 5 MG PO TAB
2.5 mg | Freq: Two times a day (BID) | ORAL | 0 refills | Status: DC | PRN
Start: 2023-11-07 — End: 2023-11-22
  Administered 2023-11-09 – 2023-11-13 (×2): 2.5 mg via ORAL

## 2023-11-07 NOTE — Progress Notes
 OCCUPATIONAL THERAPY  NOTE   Name: Kevin Obrien   MRN: [id number]     DOB: 1951/04/29      Age: 73 y.o.  Admission Date: 10/28/2023     LOS: 10 days     Date of Service: 11/07/2023         11/07/23 1100   Type of Note   Type of Note Daily   Time Calculation   Start Time 1100   Stop Time 1130   Time Calculation 30   $$ Therapeutic Activity 1 unit - 15 min   $$ Therapeutic Exercise 1 unit   Subjective   Subjective Pt seated in therapy gym following PT session and agreeable to OT. Pt withdrawn throughout session. Pt seated in bed on exit with alarm on and needs met/in reach   Precautions   Comments L sided weakness, visual impairment (horizontal gaze palsy), PEG   Cognitive   Patient Behavior Withdrawn   Sit to and from Stand   Sit to and from Stand Sit to stand;Stand to sit   Sit to Stand   Patient Needs Assistance With Facilitation of hip extension;Facilitation of trunk;Facilitation of weight shift;Balance;Cues for sequencing;Cues for positioning of hands;Extra time to complete activity;Wheelchair management;Cues for positioning of feet   Assist Level Partial/moderate Merchandiser, retail hold assist   Stand to Sit   Patient Needs Assistance With Facilitation of weight shift;Balance;Cues for sequencing;Extra time to complete activity;Financial planner;Facilitation of hip flexion;Facilitation of trunk;Cues for positioning of hands   Assist Level Partial/moderate assistance   Equipment Hand hold assist   Stand Pivot Transfer   Patient Needs Assistance With Extra time to complete activity;Financial planner;Facilitation of hip flexion;Facilitation of trunk;Facilitation of weight shift;Cues for positioning of feet;Cues for positioning of hands;Cues for sequencing;Balance;Facilitation of hip extension   Assist Level Partial/moderate assistance   Equipment Hand hold assist   Therapeutic Interventions   Therapeutic Activities Pt seated in wheelchair at table top requiring maximal verbal cues for sorting activity. Pt able to sort 1/6 cards with accuracy. Pt withdrawn for activity. Therapist downgrading activity and continued to be withdrawn and require dependent to maximal assist to sort card by color. Therapist terminated activity.   Therapeutic Exercise Pt seated in wheelchair to participate in the following exercises: bicep curl (2lb), cross body punches. Pt required maximal assist to engage in exercises, hand over hand assist, verbal cues, and visual demonstration to complete 1 set of 10 reps each exercise   Assessment   Assessment Patient presents more withdrawn throughout session and required increased time and cues to engage in activities. Continue progressing towards goals   Plan   OT Plan LUE NMR, BUE coordination/strengthening, visual scanning, dynamic sitting balance, functional transfers, standing balance, standing for ADLs as able, all ADLs   Recommendations   OT Discharge Recommendations Consistent supervision;Home setting with home health;Versus;Skilled nursing facility   OT Equipment Recommendations Tub transfer bench         Therapist: Phyliss Quale, OTD, OTR/L  Date: 11/07/2023

## 2023-11-07 NOTE — Progress Notes
 OCCUPATIONAL THERAPY  NOTE   Name: Kevin Obrien   MRN: [id number]     DOB: 11-30-1950      Age: 73 y.o.  Admission Date: 10/28/2023     LOS: 10 days     Date of Service: 11/07/2023       11/07/23 1500   Type of Note   Type of Note Daily   Time Calculation   Start Time 1500   Stop Time 1530   Time Calculation 30   $$ Phys ADL Skills 1 unit   $$ Therapeutic Activity 1 unit - 15 min   Subjective   Subjective Pt seated in bed on arrival/exit with alarm on and needs met/in reach   Precautions   Comments L sided weakness, visual impairment (horizontal gaze palsy), PEG   Cognitive   Patient Behavior Cooperative   Eating   Patient Needs Assistance With Scooping;Scanning to locate food on plate   Assist Level Partial/moderate assistance   Comments Pt eating ice chips from cup with spoon. Pt with several scoops having no ice on spoon and bringing to mouth. Verbal cues for identifying amount of ice on spoon prior to bringing to mouth   Footwear   Patient Needs Assistance With Socks: applying or removing right;Socks: applying or removing left;Shoes: applying or removing right;Shoes: applying or removing left;Balance   Assist Level Substantial/maximal assistance   Position Sitting edge of bed   Comments Assist to doff/don socks. Therapist stabilizing shoes and pt steps into them. Pt able to doff shoes with increased time   Sit to and from Stand   Sit to and from Stand Sit to stand;Stand to sit   Sit to Stand   Patient Needs Assistance With Facilitation of hip extension;Facilitation of trunk;Facilitation of weight shift;Balance;Cues for sequencing;Cues for positioning of hands;Extra time to complete activity;Wheelchair management;Cues for positioning of feet   Assist Level Partial/moderate Merchandiser, retail hold assist   Stand to Sit   Patient Needs Assistance With Facilitation of weight shift;Balance;Cues for sequencing;Extra time to complete activity;Financial planner;Facilitation of hip flexion;Facilitation of trunk;Cues for positioning of hands   Assist Level Partial/moderate assistance   Equipment Hand hold assist   Stand Pivot Transfer   Patient Needs Assistance With Extra time to complete activity;Financial planner;Facilitation of hip flexion;Facilitation of trunk;Facilitation of weight shift;Cues for positioning of feet;Cues for positioning of hands;Cues for sequencing;Balance;Facilitation of hip extension   Assist Level Partial/moderate assistance   Equipment Hand hold assist   Therapeutic Interventions   Therapeutic Activities Pt engaged in activities seated edge of therapy mat with minimal trunk support completing dynamic reaching, balance, and core activities. Pt required to grasp for bean bags x10 located on R side by feet to increase reach for LB dressing/footwear. Pt required verbal cues to complete with accuracy. Pt then engaged in lateral leans to R side and grasping for bean bags with LUE. Pt required to bear weight on to R elbow and engage core/tricep muscles to return to upright position and place bean bag in bucket on L side. Pt required moderate assist and tactile cues to complete activity. patient continues to require maximal cues for sequencing, hand placement, and safety with transfers and activities   Assessment   Assessment Patient presents more awake and alert for session with more verbal communication. Patient tolerated activities well and demonstration increased upright sitting balance during activities. Continue progressing towards goals   Plan   OT Plan LUE NMR, BUE coordination/strengthening, visual scanning, dynamic sitting balance,  functional transfers, standing balance, standing for ADLs as able, all ADLs   Recommendations   OT Discharge Recommendations Consistent supervision;Home setting with home health;Versus;Skilled nursing facility   OT Equipment Recommendations Tub transfer bench         Therapist: Phyliss Quale, OTD, OTR/L  Date: 11/07/2023

## 2023-11-07 NOTE — Progress Notes
 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  male admitted to [facility] on 10/28/2023 with the following issues: stroke      Rehabilitation Plan  Tentative discharge date: 11/19/2023  Rehabilitation: Patient will continue with comprehensive therapies including physical therapy, occupational therapy, speech & language pathology, specialized rehab nursing, neuropsychology and physiatry oversight.    Goals:  Patient Will Perform Basic Cares With: Partial/moderate assistance, Progressing  Patient Will Perform Household Mobility With: Partial/moderate assistance, At wheelchair level  Recommended therapy after discharge: Consistent supervision, Home setting with home health, Versus, Skilled nursing facility  PT recommended equipment: Too early to determine (likely RW and wheelchair Web designer))  OT recommended equipment: Tub transfer bench    Daily Functional Update:  Stand to Sit Assist Level: Partial/moderate assistance (11/06/2023  2:00 PM)  Equipment: Hand hold assist (11/06/2023  8:30 AM)     Stand Pivot Assist Level: Partial/moderate assistance (11/06/2023  2:00 PM)  Equipment: Hand hold assist (11/06/2023  2:00 PM)     Slideboard No data recorded  No data recorded   Gait Distance: 15 (11/05/2023  2:45 PM)    Assist Level: Dependent (11/05/2023  2:45 PM)    Equipment: Roller walker; Wheelchair follow (11/05/2023  2:45 PM)     Wheelchair Distance 1: From gym to room (11/06/2023  2:00 PM)    Assist Level: Substantial/maximal assistance (11/06/2023  2:00 PM)     Toileting       Assist Level: Substantial/maximal assistance (11/06/2023  8:30 AM)  Equipment: Grab bar (11/06/2023  8:30 AM)     Toilet Transfer No data recorded   Upper Body Dressing Assist Level: Substantial/maximal assistance (10/29/2023  1:00 PM)     Lower Body Dressing Assist Level: Substantial/maximal assistance (11/06/2023  8:30 AM)       Diet: PO: Ice chips only (10/29/2023  9:15 AM)      No data found.          Current Medical Problems/Risks of Medical Complications/Management    Acute Right Pontomedullary Ischemic Stroke  Proximal Right Intracranial Vertebral Artery Occlusion  Diffuse Intracranial Atherosclerosis  Bilateral Horizontal Gaze Palsy  History of Forced Left Gaze Deviation  Left Facial Palsy  Impairments: left  hemiparesis, expressive aphasia, oropharyngeal dysphagia, right facial weakness, left facial droop with dysarthria, diplopia, concern for attention/cognitive impairments, LUE ataxia, neurogenic bowel, neurogenic bladder  Impaired mobility and ADL, impaired swallow and cognition/communication               > consult PT/OT/SLP and neuropsychology to evaluate and treat               > Increase frequency of Modafinil to 100mg  BID (0600, 1200) - monitor response (adjusted time to 0600 for 5/30)   > Zoloft 50mg  daily - discontinued 5/29   > Videoswallow study 5/29- silently aspirated thin liquids and penetrated mildly thick liquids, too much oral weakness/uncoordination to consider dysphagia diet safely at this point; continue NPO at this time, continue to encourage ice chips throughout the day under supervision               > Sleep optimization: melatonin 5mg  qhs, previously on seroquel nightly               > follow up: Neurology, PM&R, Cardiology     Daytime Sleepiness - Improved  Fatigue - Improved  Increased Somnolence - Resolved  - Concern for increased somnolence 5/20 leading to Neurology Consult- rec CTA H&N; discussed with team may attempt to liberalize BP I.e. decreasing amlodipine to 5mg  due to symptoms possibly 2/2 to hypoperfusion  - CTA Head and Neck - negative for acute change  - Infectious workup including UA, CXR- negative for acute concerns  > appreciate further neurology recommendations- if acute focal weakness, numbness, coma, etc. Stroke activate/send to ED as he is high risk to have progression of his stroke/basilar involvement. Signed off.   > monitor on modafinil BID    Hyponatremia  Hyperkalemia- Resolved  - K 5.2 5/28 AM  - K 4.3 5/29 AM  - s/p lokelma x1 5/28   > monitor intermittent BMP     Secondary Stroke Risk Optimization  > Antiplatelet/Anticoagulation: DAPT with aspirin 81mg  daily and clopidogrel 75mg  daily for 90 days followed by aspirin 81mg  indefinitely  > Blood Pressure Goal: <160 during acute hospitalization, long term goal of < 130/80               - Current antihypertensive medications: amlodipine 5mg  daily, lisinopril 20mg  daily, metoprolol 25mg  BID  > Lipids: LDL is 161, with goal of <70: rosuvastatin 40mg  daily (per Cardiology recs)  > Blood Glucose: Hgb A1c is 11.7%, with goal of <7  > Tobacco abstinence goal     At risk for Spasticity               > consider trial of baclofen if indicated, discuss potential benefits and side effects               > daily ROM with therapy      Dysphagia s/p PEG tube placement  Moderate Malnutrition  - PEG tube placement 5/15 > Dietician consult, appreciate tube feed recommendations- transition from continuous to bolus feeds 5/19               Recommend Jevity 1.5 bolus feeds, 5 cartons/day + 1 prosource pkt.     Recommend 1/2 carton per bolus for first 1-2 feeds, then proceed to goal regimen.    This will provide 1857kcal, 96g protein, free H2O.    Recommend 30mL free H2O flush before and after each bolus + 100mL q4h to meet fluid needs.   To help with loose stools, before interrupting  EN support:    Avoid sorbitol containing medications such as liquid tylenol as these can make diarrhea worse; recommend switching to crushable tablet form.    Consider probiotic.    If loose stools persist, recommend adding 1-2 packets of nutrisource fiber per day.      Left Bell's Palsy  Left TMJ pain - Improved 5/28  Ear Wax B/L ear - Improved 5/28               > Alternate eye patches; eyepatch for the L eye overnight, artificial tears TID daily in the setting of left facial palsy   > lidocaine patch and tylenol to be continued as pt reports these improve left sided jaw pain   > Debrox added     Suicidal Ideation  Pseudobulbar Affect  - family denies knowledge of previous or current SI  > Per Neurology: Consider starting Dextromethorphan-Quinidine in the outpatient setting (unfortunately unavailble on the formulary inpatient) for Pseudobulbar affect, will start Fluoxetine 20mg  in the meantime --> instead was started on Sertraline 50mg , will continue while in rehab  > Neurology clinic follow up on dc  > Per neuropsychology eval on 5/28: Pt?s reported mood was sad. Pt endorsed sadness and passive SI and also made a gesture of shooting himself. He reported that the last time he thought about it was yesterday. Will place psychiatry consult for SI and CO for safety. Neuropsychology plans to re-eval tomorrow.      Oral Thrush               > oral care TID   > Nystatin swish and spit started 5/20, ok'd by SLP     Bowel Management  At risk for Neurogenic Bowel  Loose stools (resolved)  Constipation  -Last Bowel Movement Date: 11/07/23 (11/07/2023 10:00 AM)  - Goal for a daily BM or every 24-48 hours.   - c diff negative 5/18  > adjust bowel regimen PRN   > loperamide PRN  > Senna 2 tabs QHS via g tube  > MiraLax daily via g tube     Bladder Management  Neurogenic Bladder with Retention       -Post-Void (PVR) Bladder Scan (mL): 100 milliliters (11/06/2023 12:15 PM)  -Straight Cath (mL): 350 (11/07/2023 12:00 PM)  - One episode of self-void 5/28; doxazosin increased to 3mg   > Monitor urinary retention per unit protocol. If unable to void or PVRs >300cc perform clean ISC (intermittent straight catheterization) for volumes > . Stop checking BVIs if 3 consecutive volumes <150cc.   > Consider foley placement  >  doxazosin 3mg  QHS (last increase 5/28)     Hypertension  Orthostatic Hypotension  LVOT Obstruction  PFO  CAD s/p PCI               > metoprolol 25mg  BID               > amlodipine 5mg  daily, lisinopril 20mg  daily               > 30 day MCOT    > compression stockings to prevent sx orthostatic hypotension               > Follow up with outpatient Cardiology      Diabetes Mellitus               > Endocrinology consult for ongoing discharge regimen planning     Depression/Mood screen          >  neuropsychology to evaluate and treat for adjustment to new deficits          > Encouragement and support   > on zoloft 50mg  daily    Aspiration Pneumonia - Resolved  S/p AHRF  - increased O2 req overnight 5/13 with elevated procal, tachycardia, leukocytosis  - CXR 5/13 notable for mild basal opacities, favored to represent atelectasis  - s/p vanc x1 and zosyn x3 days  - sputum cx NGTD  - Augmentin to complete total 7 day course of antibiotics through 5/20 AM    Nutrition:   Body mass index is 23.37 kg/m?SABRA  Malnutrition Details:  Malnutrition present on admission  ICD-10 code E44: Chronic illness/Moderate non-severe malnutrition      Mild loss of muscle mass, Mild loss of body fat, Energy intake: Less than 75% of estimated energy requirement for 1 month or more        Loss of Subcutaneous Fat: Yes Mild Orbital, Triceps  Muscle Wasting: Yes Moderate Clavicle, Temple, Deltoid               Malnutrition Interventions: EN recs to meet 100% estimated kcal and protein needs    Wounds:        DVT ppx: enoxaparin (LOVENOX) syringe 40 mg  QDAY(21); patient will not require at the time of discharge    Code Status: Full Code    Current Medications:  Scheduled Meds:acetaminophen (TYLENOL) tablet 650 mg, 650 mg, Feeding Tube, Q6H*  amLODIPine (NORVASC) tablet 5 mg, 5 mg, Per NG tube, QDAY  artificial tears (PF) single dose ophthalmic solution 1 drop, 1 drop, Left Eye, TID  aspirin chewable tablet 81 mg, 81 mg, Per NG tube, QDAY  carbamide peroxide (DEBROX) 6.5 % otic solution 5 drop, 5 drop, Both Ears, BID  clopiDOGreL (PLAVIX) tablet 75 mg, 75 mg, Per NG tube, QDAY  diclofenac sodium (VOLTAREN) 1 % topical gel 2 g, 2 g, Topical, QID  Diet Enteral Feeding Bolus, , SEE ADMIN INSTRUCTIONS, QID (3,87,83,79)   And  Water Bolus, 100 mL, PEG Tube, QID (3,87,83,79)  doxazosin (CARDURA) tablet 3 mg, 3 mg, Feeding Tube, QHS  enoxaparin (LOVENOX) syringe 40 mg, 40 mg, Subcutaneous, QDAY(21)  insulin aspart (U-100) (NOVOLOG FLEXPEN U-100 INSULIN) injection PEN 0-12 Units, 0-12 Units, Subcutaneous, QID  insulin aspart (U-100) (NOVOLOG FLEXPEN U-100 INSULIN) injection PEN 14 Units, 14 Units, Subcutaneous, QID  insulin glargine (LANTUS SOLOSTAR U-100 INSULIN) injection PEN 18 Units, 18 Units, Subcutaneous, QDAY  lidocaine (LIDODERM) 5 % topical patch 1 patch, 1 patch, Topical, QDAY  lisinopriL (ZESTRIL) tablet 20 mg, 20 mg, Feeding Tube, QDAY  melatonin tablet 5 mg, 5 mg, Feeding Tube, QHS  metoprolol tartrate tablet 25 mg, 25 mg, Feeding Tube, BID  modafiniL (PROVIGIL) tablet 100 mg, 100 mg, Oral, BID  nystatin (MYCOSTATIN) oral suspension 500,000 Units, 500,000 Units, Swish & Spit, QID  petrolatum (STYE) ophthalmic ointment 0.25 inch, 0.25 inch, Both Eyes, TID  polyethylene glycol 3350 (MIRALAX) packet 17 g, 1 packet, Per G Tube, QDAY  rosuvastatin (CRESTOR) tablet 40 mg, 40 mg, Feeding Tube, QDAY  senna (SENOKOT) tablet 2 tablet, 2 tablet, Per G Tube, QHS  thiamine mononitrate (vit B1) tablet 100 mg, 100 mg, Per NG tube, QDAY    Continuous Infusions:  PRN and Respiratory Meds:bisacodyL QDAY PRN, dextrose 50% (D50) IV PRN, diclofenac sodium QID PRN, lidocaine hcl PRN, loperamide TID PRN, oxyCODONE Q12H PRN, pancrelipase 20,880 Units/sodium bicarbonate 650 mg ([place] CLOG DESTROYER) PRN (On Call from Rx), phenoL PRN  Subjective     No acute overnight events. Patient seen at bedside- more alert compared to yesterday AM. Denies any jaw, face, or ear pain today. Discussed pt with daughter who feels like alertness is significantly improved. Will monitor effect of modafinil BID with next AM dose at 0600. Last bowel movement 5/29. Of note, family denies knowledge of previous suicidal ideation.     Objective                          Vital Signs: Last Filed                 Vital Signs: 24 Hour Range   BP: 125/50 (05/29 0853)  Temp: 36.5 ?C (97.7 ?F) (05/29 0457)  Pulse: 74 (05/29 0853)  Respirations: 16 PER MINUTE (05/29 0457)  SpO2: 100 % (05/29 0457)  O2 Device: None (Room air) (05/29 0457) BP: (122-133)/(50-74)   Temp:  [36.5 ?C (97.7 ?F)-36.8 ?C (98.3 ?F)]   Pulse:  [70-80]   Respirations:  [16 PER MINUTE]   SpO2:  [97 %-100 %]   O2 Device: None (Room air)   Intensity Pain Scale (Self Report): 4 (11/06/23 2036) Vitals:    10/28/23 1324 10/30/23 1511 11/04/23 0401   Weight: 59.7 kg (131 lb 9.8 oz) 59.4 kg (131 lb) 63.7 kg (140 lb 6.9 oz)       Intake/Output Summary:  (Last 24 hours)    Intake/Output Summary (Last 24 hours) at 11/07/2023 1343  Last data filed at 11/07/2023 1200  Gross per 24 hour   Intake --   Output 1200 ml   Net -1200 ml        Last Bowel Movement Date: 11/07/23    Labs--reviewed  Results for orders placed or performed during the hospital encounter of 10/28/23 (from the past 24 hours)   POC GLUCOSE    Collection Time: 11/06/23  5:33 PM   # # Low-High    Glucose, POC 180 (H) 70 - 100 mg/dL   POC GLUCOSE    Collection Time: 11/06/23  8:25 PM   # # Low-High    Glucose, POC 282 (H) 70 - 100 mg/dL   POC GLUCOSE    Collection Time: 11/07/23  5:28 AM   # # Low-High    Glucose, POC 124 (H) 70 - 100 mg/dL   BASIC METABOLIC PANEL    Collection Time: 11/07/23  6:30 AM   # # Low-High    Sodium 132 (L) 137 - 147 mmol/L    Potassium 4.3 3.5 - 5.1 mmol/L    Chloride 96 (L) 98 - 110 mmol/L    Glucose 176 (H) 70 - 100 mg/dL    Blood Urea Nitrogen 31 (H) 7 - 25 mg/dL    Creatinine 9.27 9.59 - 1.24 mg/dL    Calcium 8.7 8.5 - 89.3 mg/dL    CO2 28 21 - 30 mmol/L    Anion Gap 8 3 - 12    Glomerular Filtration Rate (GFR) >60 >60 mL/min   POC GLUCOSE    Collection Time: 11/07/23 12:07 PM   # # Low-High    Glucose, POC 175 (H) 70 - 100 mg/dL         Physical Exam  General: Seen while lying in bed with CO at bedside, no distress  HEENT: oral mucosa moist; No TTP over TMJ  Heart: Extremities warm and well perfused  Lungs: Normal work of breathing on room air  Abdomen:  Soft, non distended, no tenderness to palpation, +G tube without erythema or drainage  Extremities: Peripheral edema is not present  Psych: mood and affect seemingly congruent  Neuro: At least antigravity shoulder abduction bilaterally, at least antigravity hip flexion and knee extension. Hypophonic and dysarthric, speech improving. Left facial paralysis, bilateral horizontal gaze palsy appreciated.       Labs & Therapy Notes Reviewed    Corean CHRISTELLA Brewster, DO   Physical Medicine & Rehabilitation  For patient care questions contact Rehab Medicine 3 FIRST CALL via Voalte.

## 2023-11-07 NOTE — Behavioral Health Treatment Team
 Initial Complex Behavioral Health Patient Huddle    NAME:Kevin Obrien                           FMW:[id number]              DOB:07-25-1950          AGE: 73 y.o.  ADMISSION DATE: 10/28/2023             DAYS ADMITTED: LOS: 10 days      Huddle date and time: 11/07/2023; 11:31-11:48    Patient Diagnosis: Acute ischemic stroke (CMS-HCC) [I63.9]  Relevant Diagnosis: Proximal Right Intracranial Vertebral Artery Occlusion; Diffuse Intracranial Atherosclerosis; Bilateral Horizontal Gaze Palsy; History of Forced Left Gaze Deviation; Left Facial Palsy; r/o adjustment disorder w/ mixed anxiety and depressed mood; r/o nicotine use disorder     Discharge Plan: Discharge planning on-going. Pt will not have consistent assistance at home. Will likely need SNF.    Elopement Risk: No, CO at beside for SI    Is the patient actively suicidal? Yes; reported SI on 5/28  Is the patient in restraints? No  Is there an active order for restraints? N/A; No  Police/Security Needs: No  Contraband concerns: No  Have patient belongings been removed from the room? No    Visitor/Family Concerns: No  Address visitor/family concerns: None    Behavioral Plan     Patient Factors Influencing Treatment Adherence:  Cognitive impairment: difficulties in immediate/delayed recall, attention, visuospatial skills, and language processing/fluency  Poor engagement in therapies  Poor sleep/lethargy  Pain in jaw and ear  Anxious about falling  Mood: appears depressed, reported SI  Vision deficits  Neurogenic bladder - urinary retention; has timed voids  Language - prefers Camera operator for Staff:  Limited speech: provides Y/No responses or short answers; also uses hand gestures  Aggressive behaviors towards staff (pushes people away if he doesn't want to do therapies, swatting at staff)  Poor engagement in therapies  Drowsy  Behaviors worse at night    Recommended Strategies to Improve Communication/Adherence:  Communication/Performing Cares:  Patient gets anxious and confused when staff enter his room and explain cares quickly. Patient may benefit from staff slowing down when explaining cares and providing explanations using simple language. Explain one thing at a time.  Patient may benefit from allowing time for patient to process information and then asking consent before performing each step in his care.   Patient may also benefit from simultaneous visual explanation of cares.    Mood  CO placed for [facility]  Psychiatry consult for SI. Currently on Zoloft.  Neurorehab psychology continuing to follow.    Medications  Changing pain medications to decrease drowsiness    Vision Deficits  Prefers that staff stand on the side by the window.  Will place lamp on the side of the window.    Overnight Sleep/Daytime Rest:  Use of modified therapy schedule for built-in rest breaks   Scheduling therapies for after 9AM  During rest times on schedule:  Keep blinds down, lights off, door closed,  Open blinds when not resting  Has sound machine and lamp    Staffing  Patient may prefer male staff over male staff.    Enjoys:  Listening to music (prefers [place])  Spending time outside  Engineer, water (prefers [employer])  Publishing copy     Attendees: Alyce Crest, DO, Attending Physician; Clarita Hummer SW, Social Work; Sales executive, Rehabilitation  Program Coordinator; Montie Lo RN, Charge Nurse; Suzen Hamilton RN, Nurse Manager; Charlies Rouse RN, Nurse; Mayce Witmer OTR, Occupational Therapy; Kasidee Cox DPT, Physical Therapy; Kenzie Mcanulty SLP, Speech Therapy; Chiquita Emperor CTRS, Recreational Therapist; Morna Budge PhD, Neurorehabilitation Psychology; Odella Records PhD ABPP, Neurorehabilitation Psychology

## 2023-11-07 NOTE — Case Management (ED)
 Case Management Progress Note    NAME:Kevin Obrien                          MRN: [id number]              DOB:03-Jan-1951          AGE: 73 y.o.  ADMISSION DATE: 10/28/2023             DAYS ADMITTED: LOS: 10 days      Today's Date: 11/07/2023    PLAN:   Discharge planning on-going. For pt to go home he would have to be at intermittent supervision level. Pt will likely need SNF.    Expected Discharge Date: 11/19/2023   Is Patient Medically Stable: No, Please explain: Rehab goals  Are there Barriers to Discharge? no    INTERVENTION/DISPOSITION:  Discharge Planning                 Attended behavioral huddle.  SW met with pt and granddaughter. Updated them on the changes that will be implemented. They are pleased with this plan and plan from psychiatry. They confirm pt has never had depression or SI in the past. 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     Transportation                 Support                 Info or Referral                 Positive SDOH Domains and Potential Barriers                   Medication Needs                                                                                                                                                         Estate manager/land agent                 Other                 Discharge Disposition  [name] [name], LMSW   Available on voalte  [phone]

## 2023-11-07 NOTE — Consults
 PSYCHIATRY CONSULT NOTE    Room/Bed: R2212/01  Admission Date:     10/28/2023                                                LOS: 10 days    Consult type: Co-Management w/Signed Orders   Reason for Consult:      Suicidal Ideations    Psychiatric Assessment:  Adjustment disorder with mixed anxiety and depressed mood    Relevant Medical Problems:  Acute ischemic stroke, right pontomedullary  Expressive aphasia    Recommendations:      Stop Sertraline (ordered)  Unclear if SI is related to SSRI use, but more likely related to adjustment around new stroke symptoms and resulting lifestyle change (ie requiring rehab, loss of independence, etc)  Continue CO for now if patient's family not present  Will monitor for improvement in SI over coming days  If still having mood symptoms, can consider starting alternative anti depressant early next week such as mirtazapine  Continue ongoing rehabilitation efforts per Rehab Medicine team    Reviewed EMR.   Reviewed Laboratory studies.     Seen and discussed with: Dr. Edith    Please feel free to contact us  with any additional questions or concerns by paging the consult team between 8am and 5pm on weekdays and between 8am and 3pm on weekends at [phone]. Otherwise, page the psychiatry resident on call.  ______________________________________________________________________    History of Present Illness:   Kevin Obrien is a 73 y.o. male with no significant past psychiatric history and a past medical history significant for recent ischemic stroke, DM2, neuropathy, PAD, Essential tremor, CAD who was admitted to [facility] inpatient rehab on 10/28/23 following his recent ischemic stroke. Psychiatry is consulted for SI after patient made a gesture during therapy yesterday with his hands of a gun pointed to his head.    He was seen this morning and has limited verbal output secondary to his stroke, but was able to nod/yes no appropriately to questions. His daughter and granddaughter were also present to provide history as well. Daughter and granddaughter report that he has always been a happy and outgoing person prior to his stroke. They report he was active, independent, and social. They said he has never had any significant depression or anxiety, but that he would at times get sad very briefly, mostly when thinking about his mother who passed away a few years ago. But they deny that the sadness ever lasted very long, and deny any changes to his sleep, eating, or social habits.     Patient himself did indicate that he has been thinking about harming himself. He indicated his thoughts have started around the time he was started on Sertraline. He was unable to articulate any plan about or say how he would harm himself. He does deny any intent to harm others and denies any AVH.     Daughters report that patient was started on sertraline while in the hospital for his stroke, and that it was started while he was delirious and dealing with a pneumonia. They have not noticed and increased anxiety or depression, and note that he has actually been doing better with his therapies since starting the modafinil.       Access to firearms?  Not currently    Past Psychiatric History:  Onset: 2 weeks  Outpatient Provider: does not have  Diagnoses: none  Medications: Sertraline 50mg  daily (started post-stroke)  Psychiatric Hospitalizations: none  Past Self-Injurious Behaviors: none  Past Suicide Attempts: none      Family Psychiatric History:  Unable to obtain due to patients difficulty with verbal expression    Substance Use:  Per daughters, he previously used cigarettes and drank alcohol, but has not used any in the last 40 years. They deny all other substances.    Psychosocial History:    Social History     Socioeconomic History    Marital status: Separated    Number of children: 3   Tobacco Use    Smoking status: Former     Current packs/day: 0.00     Average packs/day: 2.0 packs/day for 10.0 years (20.0 ttl pk-yrs) Types: Cigarettes     Start date: 04/11/1961     Quit date: 04/12/1971     Years since quitting: 52.6    Smokeless tobacco: Never    Tobacco comments:     Former smoker/quit years ago   Vaping Use    Vaping status: Never Used   Substance and Sexual Activity    Alcohol use: Not Currently    Drug use: Never    Sexual activity: Not Currently     Partners: Female     Birth control/protection: None         Past Medical/Surgical History:  Past Medical History:    Accidental fall    Angina pectoris    Arthritis    BPH (benign prostatic hyperplasia)    Cataract    Constipation    Coronary artery disease    DM (diabetes mellitus) (CMS-HCC)    ED (erectile dysfunction)    Heart attack (CMS-HCC)    Hyperlipidemia    Hypertension    Joint pain    Neuropathy    Shingles   :    Surgical History:   Procedure Laterality Date    CORONARY STENT PLACEMENT  2016    HX CHOLECYSTECTOMY  09/2015    COLONOSCOPY N/A 04/20/2016    Performed by Rogers Kipper, MD at [facility] ENDO    ESOPHAGOGASTRODUODENOSCOPY N/A 04/20/2016    Performed by Rogers Kipper, MD at [facility] ENDO    ESOPHAGOGASTRODUODENOSCOPY BIOPSY  04/20/2016    Performed by Rogers Kipper, MD at [facility] ENDO    UREA BREATH TEST N/A 11/13/2016    Performed by Ledora Catalina, MD at [facility] ENDO   :      Home Medications:  No medications prior to admission.       Hospital Medications:  Scheduled Meds:acetaminophen (TYLENOL) tablet 650 mg, 650 mg, Feeding Tube, Q6H*  amLODIPine (NORVASC) tablet 5 mg, 5 mg, Per NG tube, QDAY  artificial tears (PF) single dose ophthalmic solution 1 drop, 1 drop, Left Eye, TID  aspirin chewable tablet 81 mg, 81 mg, Per NG tube, QDAY  carbamide peroxide (DEBROX) 6.5 % otic solution 5 drop, 5 drop, Both Ears, BID  clopiDOGreL (PLAVIX) tablet 75 mg, 75 mg, Per NG tube, QDAY  diclofenac sodium (VOLTAREN) 1 % topical gel 2 g, 2 g, Topical, QID  Diet Enteral Feeding Bolus, , SEE ADMIN INSTRUCTIONS, QID (3,87,83,79)   And  Water Bolus, 100 mL, PEG Tube, QID (3,87,83,79)  doxazosin (CARDURA) tablet 3 mg, 3 mg, Feeding Tube, QHS  enoxaparin (LOVENOX) syringe 40 mg, 40 mg, Subcutaneous, QDAY(21)  insulin aspart (U-100) (NOVOLOG FLEXPEN U-100 INSULIN) injection PEN 0-12 Units, 0-12 Units, Subcutaneous, QID  insulin aspart (U-100) (  NOVOLOG FLEXPEN U-100 INSULIN) injection PEN 14 Units, 14 Units, Subcutaneous, QID  insulin glargine (LANTUS SOLOSTAR U-100 INSULIN) injection PEN 18 Units, 18 Units, Subcutaneous, QDAY  lidocaine (LIDODERM) 5 % topical patch 1 patch, 1 patch, Topical, QDAY  lisinopriL (ZESTRIL) tablet 20 mg, 20 mg, Feeding Tube, QDAY  melatonin tablet 5 mg, 5 mg, Feeding Tube, QHS  metoprolol tartrate tablet 25 mg, 25 mg, Feeding Tube, BID  modafiniL (PROVIGIL) tablet 100 mg, 100 mg, Oral, BID  nystatin (MYCOSTATIN) oral suspension 500,000 Units, 500,000 Units, Swish & Spit, QID  petrolatum (STYE) ophthalmic ointment 0.25 inch, 0.25 inch, Both Eyes, TID  polyethylene glycol 3350 (MIRALAX) packet 17 g, 1 packet, Per G Tube, QDAY  rosuvastatin (CRESTOR) tablet 40 mg, 40 mg, Feeding Tube, QDAY  senna (SENOKOT) tablet 2 tablet, 2 tablet, Per G Tube, QHS  sertraline (ZOLOFT) tablet 50 mg, 50 mg, Feeding Tube, QDAY  thiamine mononitrate (vit B1) tablet 100 mg, 100 mg, Per NG tube, QDAY    Continuous Infusions:  PRN and Respiratory Meds:bisacodyL QDAY PRN, dextrose 50% (D50) IV PRN, diclofenac sodium QID PRN, lidocaine hcl PRN, loperamide TID PRN, oxyCODONE BID PRN, pancrelipase 20,880 Units/sodium bicarbonate 650 mg ([place] CLOG DESTROYER) PRN (On Call from Rx), phenoL PRN  :    Allergies:  Seasonal allergies      Review of Systems:  Review of systems not obtained from patient due to patient factors.        Vital Signs:  Last Filed in 24 hours Vital Signs:  24 hour Range    BP: 125/50 (05/29 0853)  Temp: 36.5 ?C (97.7 ?F) (05/29 0457)  Pulse: 74 (05/29 0853)  Respirations: 16 PER MINUTE (05/29 0457)  SpO2: 100 % (05/29 0457)  O2 Device: None (Room air) (05/29 0457) BP: (122-133)/(50-74)   Temp:  [36.5 ?C (97.7 ?F)-36.8 ?C (98.3 ?F)]   Pulse:  [70-80]   Respirations:  [16 PER MINUTE]   SpO2:  [97 %-100 %]   O2 Device: None (Room air)     Mental Status Evaluation:    General/Constitutional: appears stated age, good hygiene, dressed in personal attire  Eye Contact: poor  Behavior: calm, sitting in wheelchair  Speech: dysarthric, limited speech output  Motor: bilateral horizontal gaze palsy, left upper and lower facial weakness  Mood: unable to assess due to limited speech output  Affect: dysthymic  Thought Process: difficult to assess  Thought Content: reports suicidal ideation without plan, no evidence of delusions  Perception: denies auditory hallucinations, denies visual hallucinations, does not appear to be responding to internal stimuli  Orientation: unable to assess  Gait: deferred, patient seen sitting in wheelchair  Patient's ability to perform ADLs: dependent  Risk of harm to self: low  Risk of harm to others: low      Focused Physical Exam:  Neuro: bilateral gaze palsy, left facial weakness  Musculoskeletal: no obvious MSK abnormalities    Lab/Radiology/Other Diagnostic Tests:  24-hour labs:    Results for orders placed or performed during the hospital encounter of 10/28/23 (from the past 24 hours)   POC GLUCOSE    Collection Time: 11/06/23  8:25 PM   Result Value Ref Range    Glucose, POC 282 (H) 70 - 100 mg/dL   POC GLUCOSE    Collection Time: 11/07/23  5:28 AM   Result Value Ref Range    Glucose, POC 124 (H) 70 - 100 mg/dL   BASIC METABOLIC PANEL    Collection Time: 11/07/23  6:30 AM  Result Value Ref Range    Sodium 132 (L) 137 - 147 mmol/L    Potassium 4.3 3.5 - 5.1 mmol/L    Chloride 96 (L) 98 - 110 mmol/L    Glucose 176 (H) 70 - 100 mg/dL    Blood Urea Nitrogen 31 (H) 7 - 25 mg/dL    Creatinine 9.27 9.59 - 1.24 mg/dL    Calcium 8.7 8.5 - 89.3 mg/dL    CO2 28 21 - 30 mmol/L    Anion Gap 8 3 - 12    Glomerular Filtration Rate (GFR) >60 >60 mL/min   POC GLUCOSE Collection Time: 11/07/23 12:07 PM   Result Value Ref Range    Glucose, POC 175 (H) 70 - 100 mg/dL       Redell Stanford, DO

## 2023-11-08 LAB — BASIC METABOLIC PANEL CELLULAR THERAPEUTICS
~~LOC~~ BKR BLD UREA NITROGEN: 26 mg/dL — ABNORMAL HIGH (ref 7–25)
~~LOC~~ BKR CHLORIDE: 97 mmol/L — ABNORMAL LOW (ref 98–110)
~~LOC~~ BKR CREATININE: 0.7 mg/dL — ABNORMAL LOW (ref 0.40–1.24)
~~LOC~~ BKR POTASSIUM: 4.3 mmol/L — ABNORMAL LOW (ref 3.5–5.1)

## 2023-11-08 LAB — POC GLUCOSE
~~LOC~~ BKR POC GLUCOSE: 200 mg/dL — ABNORMAL HIGH (ref 70–100)
~~LOC~~ BKR POC GLUCOSE: 238 mg/dL — ABNORMAL HIGH (ref 70–100)
~~LOC~~ BKR POC GLUCOSE: 162 mg/dL — ABNORMAL HIGH (ref 70–100)
~~LOC~~ BKR POC GLUCOSE: 202 mg/dL — ABNORMAL HIGH (ref 70–100)
~~LOC~~ BKR POC GLUCOSE: 357 mg/dL — ABNORMAL HIGH (ref 70–100)

## 2023-11-08 LAB — CBC CELLULAR THERAPEUTICS
~~LOC~~ BKR MCV: 85 fL — ABNORMAL HIGH (ref 80.0–100.0)
~~LOC~~ BKR RBC COUNT: 4 10*6/uL — ABNORMAL LOW (ref 4.40–5.50)
~~LOC~~ BKR WBC COUNT: 11 10*3/uL — ABNORMAL HIGH (ref 4.50–11.00)

## 2023-11-08 MED ORDER — INSULIN ASPART 100 UNIT/ML SC FLEXPEN
16 [IU] | Freq: Four times a day (QID) | SUBCUTANEOUS | 0 refills | Status: DC
Start: 2023-11-08 — End: 2023-11-12

## 2023-11-08 NOTE — Progress Notes
 PHYSICAL THERAPY  NOTE      Name: Allex Lapoint   MRN: [id number]     DOB: Jan 06, 1951      Age: 73 y.o.  Admission Date: 10/28/2023     LOS: 11 days     Date of Service: 11/08/2023         11/08/23 1040   Type of Note   Type of Note Daily   Time Calculation   Start Time 1040   Stop Time 1115   Time Calculation 35   Missed Minutes 25   Reasons for Missed Minutes Refused   Plan to Make Up Missed Minutes Will try to make up this afternoon vs. schedule for over weekend   $$ Professional Contact 1 unit   (Discussion with nursing staff and medical team)   $$ PT Therapeutic Activity 2 units   Subjective   Subjective Pt maintained in bed throughout the encounter. Denies pain and fatigue. Pt refusing all interventions including getting up to chair, going outside and assisting in clean up from bowel incontinence. Pt shaking head yes/no intermittently as form of communication indicating that he just didn't want to get out of bed.   Lying to Sitting on Side of Bed   Comments Pt attempted to sit EOB from flat surface, but refusing assistance from therapy staff. Pt sat part way at EOB before returning to supine position.   Therapeutic Interventions   Therapeutic Activities Majority of session spent trying to get patient OOB utilizing strategies discussed in team huddle on 5/29, including pt personal preferences on how staff can help him be more successful including standing on R side in well lit room, giving increased time and multiple options for intervention. Pt kept his eyes closed for majority of encounter and communicated via shaking head yes/no intermittently. Foul smell noted from patient brief and pt declining assist for peri hygiene. Pt took off gown and initially agreeable to assist with dressing and attempted to sit up EOB, but gesturing therapist to move away out of arms reach. Therapist providing appropriate space for patient but once pt was unsuccessful independent transfer to EOB, he returned to supine and refused all therapist attempts to therapeutic interventions.   Patient and Fish farm manager Participants Patient   Training Provided importance on participating in therapies to improve mobility and work toward patients independence with functional goals   Training Assessment Pt not receptive to education, will continue to provide.   Assessment   Assessment Pt did not participate in therapist despite utilizing skilled intervention and strategies to encourage participation. Pt continues to demonstrate difficulty transitioning to EOB without assist. Concern for depressed mood being main limiting factor in patients participation as he denies pain and fatigue as reasons to why he does not want to mobilize out of bed. Will continue to provide intervention as pt is agreeable.   Plan   PT Plan See behavior huddle note, gait training (hand hold assist, vector to help with posture vs rifton), neurogym for higher reps (consider using mirror for visual feedback), standing/reaching activities to promote upright posture, trial stairs as appropriate, nustep for conditioning, outcome measures as appropriate, continued wc ultralight propulsion; standing at plinth   Recommendations   PT Discharge Recommendations Consistent assistance;Home setting with home health;Versus;Skilled nursing facility   PT Equipment Recommendations Too early to determine  (wheelchair Web designer))         Therapist: Morene Greet, PT, [place] [id number]   Date: 11/08/2023

## 2023-11-08 NOTE — Progress Notes
 Brief Endocrine Note    Patient chart reviewed and not physically seen today. Glucose values reviewed as below: worsening control last evening. Overall BS trending higher.     Recent Labs     11/06/23  1733 11/06/23  2025 11/07/23  0528 11/07/23  1207 11/07/23  1625 11/07/23  2203 11/08/23  0628 11/08/23  1217   GLUPOC 180* 282* 124* 175* 357* 202* 162* 200*           Recommendations:   No changes for now but would monitor for trend. Suspecting worsening control from decrease participation in therapy VS any occult infection as WBC increased today  Continue insulin glargine 18u every day   Continue Aspart 14 units with each bolus feed  Continue mid dose correction factor to QID with bolus feeds     Diabetes Plans Post Discharge  Diabetes regimen: TBD  He will most likely need insulin on discharge.   We will request for outpatient follow-up. Family would like to come to [place] [facility]. He used to see [place] [facility] and last visit was in 2020      Will continue to follow.     Lura Monica, MD  Available on Voalte

## 2023-11-08 NOTE — Progress Notes
 SPEECH-LANGUAGE PATHOLOGY  DAILY NOTE     11/08/23 1510   Behavior   Comments* Pt resting in bed with CO present when SLP arrived. Pt agreeable to complete therapy session at the bed level. Pt's daughter and CO present at bedside at end of session.   Dysphagia Goals   Therapy Activities PO trials;Swallow exercises   Swallow exercises Expiratory muscle strength trainer (EMST)   PO Trials Thin liquids   Therapy Activity Comments Pt consumed therapeutic PO trials of thin water via teaspoon x6. Anterior escape of liquids on L side noted x1. Oral phase appeared to be prolonged. Anticipate early spillover and delayed initiation of swallow (consistent with VSS completed previous date). Pt demonstrated overt coughing in 1/6 trials; however, silent aspiration observed previous date; therefore, cannot rule in/out aspiration at the bedside.     Continue with EMST Lite at lowest setting. Pt demonstrated moderate difficulty with adequate labial seal around device (particularly on L side); however, able to successfully complete x4 this date. Pt required total assist for accurate placement and repetition of instructions on each trial.   Orientation Goals   Therapy Activities Orientation log score   Orientation Log Score (Out of 30) 12/30   Therapy Activity Comments Pt was independently oriented to kind of place only. Logical cues effective for year and time of day. Increased cues (choice of 2 options) effective for city, month/date, and day of week. Pt was not oriented to name of hospital, etiology (pt stated so many things and I'm very sick) or pathology deficits. Pt unable to recall etiology after <5 minute delay. Pt stated I think I'm going crazy, I want to be correct. Pt required increased processing time throughout O-Log administration (>30  minutes total).     Pt reported sleeping in parking lot previous evening. Attempted to re-orient pt and provided therapeutic listening.     Assessment O-Log score -7 points compared to score 5 days ago and continues to fall below normal limits.    Plan Cognition:  -- O-log  -- Simple attention/memory  -- Visual scanning  -- Medication management  -- Finance management     Dysphagia:  -- PO trials thin/mildly thick liquids, pureed solids  -- EMST-Lite  -- Swallow exercises as able (effortful swallow, masako, lingual protrusion/holds, CTAR)   -- Continue IOPI as able     [employer] MS, CCC-SLP  [place] [id number]

## 2023-11-08 NOTE — Progress Notes
 OCCUPATIONAL THERAPY  NOTE   Name: Kevin Obrien   MRN: [id number]     DOB: 1951/05/18      Age: 73 y.o.  Admission Date: 10/28/2023     LOS: 11 days     Date of Service: 11/08/2023         11/08/23 1300   Type of Note   Type of Note Daily   Time Calculation   Start Time 1300   Stop Time 1400   Time Calculation 60   $$ Phys ADL Skills 4 units   Subjective   Subjective Pt seated in bed on arrival and requesting a shower. Pt seated in bed on exit with alarm on and needs met/in reach. CO present in room on therapist exit   Precautions   Comments L sided weakness, visual impairment (horizontal gaze palsy), PEG   Cognitive   Patient Behavior Cooperative;Tearful   Cognition Follows Commands   Oral Hygiene   Patient Needs Assistance With Balance   Assist Level Supervision or touching assistance   Position Sitting in chair   Comments pt seated in wheelchair and leaning heavily onto counter intermittently. Complete all aspects of task with increased time   Grooming   Patient Needs Assistance With Shaving   Assist Level Dependent   Position Sitting in chair   Comments therapist assisted with shaving face for safety and visual deficits   Bathing   Bath/Shower Soap and water shower/bath   Patient Needs Assistance With Buttocks;Left arm;Right arm;Left lower leg and foot;Right lower leg and foot;Standing balance;Sitting balance   Assist Level Substantial/maximal assistance   Position Standing for perineal hygiene only   Comments Intermittent assist for sitting balance throughout. Required increased time and encouragement to wash chest, thighs, arms, abdomen, and perineal area. Pt required assist to complete rest of task   Tub/Shower Transfer   Patient Needs Assistance With Financial planner;Facilitation of hip flexion;Facilitation of trunk;Facilitation of weight shift;Increased time to complete;Balance;Verbal cues;Transferring on;Transferring off;Assist of two   Transfer Type Stand pivot   Assist Level Partial/moderate assistance   Equipment Shower chair   Comments ModA   Lower Body Dressing   Patient Needs Assistance With Underwear: threading or unthreading RLE;Underwear: threading or unthreading LLE;Pants: threading or unthreading RLE;Pants: threading or unthreading LLE;Balance   Assist Level Substantial/maximal assistance   Position Standing only to pull pants over hips   Comments assist to thread BLE. pt pulling over hips with increased time and MaxA for balance   Footwear   Patient Needs Assistance With Socks: applying or removing right;Socks: applying or removing left;Balance   Assist Level Substantial/maximal assistance   Position Sitting edge of bed   Comments Pt doffs R sock with encouragement and increased time. Required assist for balance throughout and assist to doff L sock and don socks   Toileting   Patient Needs Assistance With Adjusting clothing before toileting;Adjusting clothing after toileting;Balance   Assist Level Substantial/maximal assistance   Position Standing for pants management;Standing for wiping   Comments Pt completes posterior hygiene   Toilet Transfer   Patient Needs Assistance With Facilitation of trunk;Facilitation of weight shift;Increased time to complete;Balance;Verbal cues;Transferring on;Transferring off   Transfer Type Stand pivot   Assist Level Partial/moderate assistance   Equipment Grab bar   Comments ModA   Daily Care   Bladder Management Comments Attempted void, no output   Sit to Lying   Patient Needs Assistance With Bilateral lower extremities   Assist Level Partial/moderate assistance   Equipment With rails;HOB  elevated   Comments ModA   Lying to Sitting on Side of Bed   Patient Needs Assistance With Trunk;Extra time to complete the activity   Assist Level Partial/moderate assistance   Equipment With rails;HOB elevated   Sit to and from Stand   Sit to and from Stand Sit to stand;Stand to sit   Sit to Stand   Patient Needs Assistance With Facilitation of hip extension;Facilitation of trunk;Facilitation of weight shift;Balance;Cues for sequencing;Cues for positioning of hands;Extra time to complete activity;Wheelchair management;Cues for positioning of feet   Assist Level Partial/moderate assistance   Equipment Hand hold assist   Comments cues for hand placement prior to standing   Stand to Sit   Patient Needs Assistance With Facilitation of weight shift;Balance;Cues for sequencing;Extra time to complete activity;Financial planner;Facilitation of hip flexion;Facilitation of trunk;Cues for positioning of hands   Assist Level Substantial/maximal assistance   Equipment Hand hold assist   Comments varying assist from Mod to Max assist   Chair/Bed-to-Chair Transfer   Chair/Bed-to-Chair Transfer Stand pivot   Stand Pivot Transfer   Patient Needs Assistance With Extra time to complete activity;Financial planner;Facilitation of hip flexion;Facilitation of trunk;Facilitation of weight shift;Cues for positioning of feet;Cues for positioning of hands;Cues for sequencing;Balance;Facilitation of hip extension   Assist Level Partial/moderate assistance   Equipment Hand hold assist   Comments bed>wheelchair>shower chair>wheelchair>toilet>wheelchair>bed   Therapeutic Interventions   Other Pt very tearful at end of session and reporting being in the parking garage and having to call a friend to come help him get cleaned up because he can not walk anymore. Therapist providing therapeutic listening and notifying team. Pt reporting feeling like his family or others in his life care for him. Therapist continuing therapeutic listening. Pt reporting his english takes a while to come back and has a hard time communicating in the mornings. Will use ipad interpreter in future sessions to eliminate communication barriers. Therapist updated team on information above.   Assessment   Assessment Patient completed ADLs this session with good participation throughout. Continues to benefit from increased time and encouragement to engage in tasks. Will trial modified schedule to increase participation in therapies. Continue progressing towards goals   Plan   OT Plan LUE NMR, BUE coordination/strengthening, visual scanning, dynamic sitting balance, functional transfers, standing balance, standing for ADLs as able, all ADLs   Recommendations   OT Discharge Recommendations Consistent supervision;Home setting with home health;Versus;Skilled nursing facility   OT Equipment Recommendations Tub transfer bench       Therapist: Phyliss Quale, OTD, OTR/L  Date: 11/08/2023

## 2023-11-08 NOTE — Progress Notes
 REC THERAPY DAILY NOTE    Name: Kevin Obrien   MRN: [id number]     DOB: [birth year]      Age: 72 y.o.  Admission Date: 10/28/2023     LOS: 11 days     Date of Service: 11/08/2023      Daily Note    Patient seen x1 this date.  Documentation reflects all daily treatment sessions.    Pt attended activity/group: Yes  Pt participated: Fully  Pt's interactions: Patient seated upright in bed, agreeable to participate. Engaged in playing guitar. Reminisced about playing guitar as a child. Reports pain in BUE from arthritis. Intermittently played guitar with RUE. Responded well to encouragement. Preformed pincer grasp to hold pick. Required frequent rest breaks. Left guitar in room for play during free time. CO present on departure.     Activity was: Publishing copy    Plan: Spend time outside, continue playing guitar    Therapist: Chiquita Emperor, [place]   Date: 11/08/2023

## 2023-11-08 NOTE — Progress Notes
 OCCUPATIONAL THERAPY  NOTE   Name: Bashir Marchetti   MRN: [id number]     DOB: Aug 18, 1950      Age: 73 y.o.  Admission Date: 10/28/2023     LOS: 11 days     Date of Service: 11/08/2023       11/08/23 1000   History   Reason For Admission The patient presented to [facility] on 10/17/2023 with left gaze deviation, facial weakness and dysarthria.  CT head was negative for acute changes.  CTA head and neck without LVO.  Patient was not a TNK or EVT candidate. 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 Neurology recommend DAPT with aspirin and Plavix.  He is currently on permissive hypertension.   Previous Medical History 73 y.o. male and  has a past medical history of Accidental fall, Angina pectoris, Arthritis, BPH (benign prostatic hyperplasia), Cataract, Constipation, Coronary artery disease, DM (diabetes mellitus) (CMS-HCC), ED (erectile dysfunction), Heart attack (CMS-HCC), Hyperlipidemia, Hypertension, Joint pain, Neuropathy, and Shingles.   Subjective   Subjective Patient supine in bed upon arrival asleep.  Attempted multiple times to awaken patient, patient initially remaining with eye closed then became agitated with alternate techniques to awaken patient.  Repositioned in bed, CO present     Therapist: Ruthie Genera, OT  Date: 11/08/2023

## 2023-11-09 LAB — URINALYSIS MICROSCOPIC REFLEX TO CULTURE: ~~LOC~~ BKR RBC, UA: 2 /HPF — AB (ref 5.0–8.0)

## 2023-11-09 LAB — URINALYSIS DIPSTICK REFLEX TO CULTURE
~~LOC~~ BKR NITRITE: POSITIVE — AB
~~LOC~~ BKR URINE BILE: NEGATIVE
~~LOC~~ BKR URINE KETONE: NEGATIVE

## 2023-11-09 LAB — POC GLUCOSE
~~LOC~~ BKR POC GLUCOSE: 137 mg/dL — ABNORMAL HIGH (ref 70–100)
~~LOC~~ BKR POC GLUCOSE: 153 mg/dL — ABNORMAL HIGH (ref 70–100)
~~LOC~~ BKR POC GLUCOSE: 164 mg/dL — ABNORMAL HIGH (ref 70–100)
~~LOC~~ BKR POC GLUCOSE: 169 mg/dL — ABNORMAL HIGH (ref 70–100)
~~LOC~~ BKR POC GLUCOSE: 232 mg/dL — ABNORMAL HIGH (ref 70–100)

## 2023-11-09 LAB — CBC AND DIFF
~~LOC~~ BKR HEMATOCRIT: 32 % — ABNORMAL LOW (ref 40.0–50.0)
~~LOC~~ BKR HEMOGLOBIN: 11 g/dL — ABNORMAL LOW (ref 13.5–16.5)
~~LOC~~ BKR MCH: 30 pg — ABNORMAL LOW (ref 26.0–34.0)
~~LOC~~ BKR MCHC: 35 g/dL — ABNORMAL LOW (ref 32.0–36.0)
~~LOC~~ BKR MCV: 84 fL — ABNORMAL LOW (ref 80.0–100.0)
~~LOC~~ BKR RBC COUNT: 3.7 10*6/uL — ABNORMAL LOW (ref 4.40–5.50)
~~LOC~~ BKR WBC COUNT: 11 10*3/uL — ABNORMAL HIGH (ref 4.50–11.00)

## 2023-11-09 MED ORDER — TRIMETHOPRIM-SULFAMETHOXAZOLE 40-200 MG/5 ML PO SUSP
160 mg | Freq: Two times a day (BID) | GASTROSTOMY | 0 refills | Status: DC
Start: 2023-11-09 — End: 2023-11-11
  Administered 2023-11-10 – 2023-11-11 (×4): 160 mg via GASTROSTOMY

## 2023-11-09 MED ORDER — NITROFURANTOIN MONOHYD/M-CRYST 100 MG PO CAP
100 mg | Freq: Two times a day (BID) | ORAL | 0 refills | Status: DC
Start: 2023-11-09 — End: 2023-11-09

## 2023-11-09 NOTE — Progress Notes
 Acute Inpatient Rehabilitation Behavior Summary of Shift    Name: Kevin Obrien   MRN: [id number]     DOB: 12/11/1950      Age: 73 y.o.  Admission Date: 10/28/2023     LOS: 12 days     Date of Service: 11/09/2023      Reason for increased observation? Verbalised SI  One-on-one safety watch: Yes  Q15-30 minute safety watch: No  Suicide precaution: Yes  Telemonitoring: No    Behavior outbursts during this shift? No  Description and time(s) of event: N/A   Agitation during this shift? Yes  Description and time(s) of event: In the a.m patient refused his medications, only to take them couple of hours later, he has been refusing his eye ointment, ear drops and randomly taking the artificial tears with cuing.     Active suicide ideation during this shift? No   Description and time(s) of event: N/A    Poor safety awareness or impulsivity during this shift? Yes  Description and time(s) of event: Wanting to get out of bed without help, cued and helped to the chair by CO.    Noteworthy visitor interactions during this shift? Yes Family has been at bedside all day except for a few step outs for fresh air.  Sleep/rest patterns observed during this shift? Patient took a long nap after therapy sessions.  Therapeutic interventions:    Redirection  Additional Comments: He went to the roof with CO and family and said he enjoyed the sun.  Patient does better given autonomy and choice, with cues he will cooperate with most cares.

## 2023-11-09 NOTE — Progress Notes
 OCCUPATIONAL THERAPY  NOTE   Name: Kevin Obrien   MRN: [id number]     DOB: 09/19/1950      Age: 73 y.o.  Admission Date: 10/28/2023     LOS: 12 days     Date of Service: 11/09/2023     11/09/23 1015   Type of Note   Type of Note Daily   Time Calculation   Start Time 1015   Stop Time 1115   Time Calculation 60   $$ Phys ADL Skills 3 units   $$ Therapeutic Activity 1 unit - 15 min   Subjective   Subjective Pt seated in bed on arrival and agreeable to OT. Pt seated in bed on exit with alarm on and needs met/in reach   Intensity Pain   Words to Describe Pain Aching   Location of Pain Jaw   Location Pain Orientation Left   Pain Intervention(s) Other (Comment)  (RN notified)   Precautions   Comments L sided weakness, visual impairment (horizontal gaze palsy), PEG   Cognitive   Orientation Person;Place;Situation   Patient Behavior Calm;Cooperative;Withdrawn  (cooperative with increased time)   Cognition Decrease attention/ concentration;Follows Commands  (follows commands with increased time)   Footwear   Patient Needs Assistance With Socks: applying or removing right;Socks: applying or removing left;Balance   Assist Level Substantial/maximal assistance   Position Sitting edge of bed   Comments Therapist assist to start task of doffing sock. Pt able to pull socks off  with increased time and encouragement. Required assist to don socks   Toileting   Patient Needs Assistance With Adjusting clothing before toileting;Wiping self;Balance   Assist Level Dependent   Position Standing for pants management;Standing for wiping   Comments Pt pulls put brief and pants with maximal assist for balance. Assist of another for safety and wiping   Toilet Transfer   Patient Needs Assistance With Facilitation of trunk;Facilitation of weight shift;Increased time to complete;Balance;Verbal cues;Transferring on;Transferring off   Transfer Type Stand pivot   Assist Level Partial/moderate assistance   Equipment Grab bar   Comments Moderate assist of OT   Daily Care   Patient continent of bladder? No   Device Absorbent diapers   Urine Function Diaper/brief/incontinence pad   Urine Color Unable to assess or not observed   Urine Description Unable to assess or not observed   Patient continent of bowel? No   Device Absorbent pad   Accident  - Bowel Small   Stool Amount Smear   Stool Appearance Loose;Pasty   Stool Color Tan   Last Bowel Movement Date 11/09/23   Sit to Lying   Patient Needs Assistance With Bilateral lower extremities   Assist Level Partial/moderate assistance   Equipment With rails;HOB elevated   Lying to Sitting on Side of Bed   Patient Needs Assistance With Trunk;Extra time to complete the activity   Assist Level Partial/moderate assistance   Equipment With rails;HOB elevated   Sit to and from Stand   Sit to and from Stand Sit to stand;Stand to sit   Sit to Stand   Patient Needs Assistance With Facilitation of hip extension;Facilitation of trunk;Facilitation of weight shift;Balance;Cues for sequencing;Cues for positioning of hands;Extra time to complete activity;Wheelchair management;Cues for positioning of feet   Assist Level Partial/moderate Merchandiser, retail hold assist   Stand to Sit   Patient Needs Assistance With Facilitation of weight shift;Balance;Cues for sequencing;Extra time to complete activity;Financial planner;Facilitation of hip flexion;Facilitation of trunk;Cues for positioning of hands  Assist Level Partial/moderate assistance   Equipment Hand hold assist   Chair/Bed-to-Chair Transfer   Chair/Bed-to-Chair Transfer Stand pivot   Stand Pivot Transfer   Patient Needs Assistance With Extra time to complete activity;Financial planner;Facilitation of hip flexion;Facilitation of trunk;Facilitation of weight shift;Cues for positioning of feet;Cues for positioning of hands;Cues for sequencing;Balance;Facilitation of hip extension   Assist Level Partial/moderate assistance   Equipment Hand hold assist Therapeutic Interventions   Therapeutic Activities Pt engaged in leisure outdoor activity up on roof top. Pt enjoyed listening to The Beatles and talking about how he use to play the guitar to the Beatles songs in the past. Pt wearing sunglasses outside to increase comfort and decreased eye strain from sun.   Assessment   Assessment Patient engaged in activities with encouragement and increased time. Enjoyed listening to [employer] throughout session. Continue progressing towards goals   Plan   OT Plan LUE NMR, BUE coordination/strengthening, visual scanning, dynamic sitting balance, functional transfers, standing balance, standing for ADLs as able, all ADLs   Recommendations   OT Discharge Recommendations Consistent supervision;Home setting with home health;Versus;Skilled nursing facility   OT Equipment Recommendations Tub transfer bench       Therapist: Phyliss Quale, OTD, OTR/L  Date: 11/09/2023

## 2023-11-09 NOTE — Progress Notes
 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 Afebrile. Voiding overnight. Some documented reporting of cloudy urine - will obtain UA w/reflex. Declined AM meds. Encouraged to take later in day as wakes up.                     Assessment/Plan:     Kevin Obrien is a 73 y.o.  male admitted to [facility] on 10/28/2023 with the following issues: stroke      Rehabilitation Plan  Tentative discharge date: 11/19/2023  Rehabilitation: Patient will continue with comprehensive therapies at modified therapy schedule including physical therapy, occupational therapy, speech & language pathology, specialized rehab nursing, neuropsychology and physiatry oversight.    Goals:  Patient Will Perform Basic Cares With: Partial/moderate assistance, Progressing  Patient Will Perform Household Mobility With: Partial/moderate assistance, At wheelchair level  Recommended therapy after discharge: Consistent assistance, Home setting with home health, Versus, Skilled nursing facility  PT recommended equipment: Too early to determine (wheelchair Web designer))  OT recommended equipment: Tub transfer bench    Daily Functional Update:  Stand to Sit Assist Level: Partial/moderate assistance (11/08/2023  1:00 PM)  Equipment: Hand hold assist (11/08/2023  1:00 PM)     Stand Pivot Assist Level: Partial/moderate assistance (11/08/2023  1:00 PM)  Equipment: Hand hold assist (11/08/2023  1:00 PM)     Slideboard No data recorded  No data recorded   Gait Distance: 25 (11/07/2023 10:10 AM)    Assist Level: Dependent (11/05/2023  2:45 PM)    Equipment: Body weight supported; Hand hold assist (11/07/2023 10:10 AM)     Wheelchair Distance 1: From room to gym (11/07/2023 10:10 AM)    Assist Level: Substantial/maximal assistance (11/07/2023 10:10 AM)     Toileting       Assist Level: Substantial/maximal assistance (11/08/2023  1:00 PM)  Equipment: -- Jayson Steady) (11/07/2023 10:10 AM)     Toilet Transfer No data recorded   Upper Body Dressing Assist Level: Substantial/maximal assistance (10/29/2023  1:00 PM)     Lower Body Dressing Assist Level: Substantial/maximal assistance (11/08/2023  1:00 PM)       Diet: PO: Ice chips only (11/07/2023  1:30 PM)      Orientation Log & Cognitive Log Scores for the past 72 hrs (Last 3 readings):   Orientation Log Score (Out of 30)   11/08/23  1510 12/30             Current Medical Problems/Risks of Medical Complications/Management    Acute Right Pontomedullary Ischemic Stroke  Proximal Right Intracranial Vertebral Artery Occlusion  Diffuse Intracranial Atherosclerosis  Bilateral Horizontal Gaze Palsy  History of Forced Left Gaze Deviation  Left Facial Palsy  Impairments: left hemiparesis, expressive aphasia, oropharyngeal dysphagia, right facial weakness, left facial droop with dysarthria, diplopia, concern for attention/cognitive impairments, LUE ataxia, neurogenic bowel, neurogenic bladder  Impaired mobility and ADL, impaired swallow and cognition/communication               > consult PT/OT/SLP and neuropsychology to evaluate and treat               > Increase frequency of Modafinil to 100mg  BID (0600, 1200) - monitor response (adjusted time to 0600 for 5/30)   > Zoloft 50mg  daily - discontinued 5/29   > Videoswallow study 5/29- silently aspirated thin liquids and penetrated mildly thick liquids, too much oral weakness/uncoordination to consider dysphagia diet safely at this point; continue NPO at this time, continue to encourage ice chips throughout the day under supervision               > Sleep optimization: melatonin 5mg  qhs, previously on seroquel nightly               > follow up: Neurology, PM&R, Cardiology     Daytime Sleepiness - Improved  Fatigue - Improved  Increased Somnolence - Resolved  - Concern for increased somnolence 5/20 leading to Neurology Consult- rec CTA H&N; discussed with team may attempt to liberalize BP I.e. decreasing amlodipine to 5mg  due to symptoms possibly 2/2 to hypoperfusion  - CTA Head and Neck - negative for acute change  - Infectious workup including UA, CXR- negative for acute concerns  > appreciate further neurology recommendations- if acute focal weakness, numbness, coma, etc. Stroke activate/send to ED as he is high risk to have progression of his stroke/basilar involvement. Signed off.   > monitor on modafinil BID    Hyponatremia  Hyperkalemia- Resolved  - K 5.2 5/28 AM  - K 4.3 5/29 AM  - s/p lokelma x1 5/28   > monitor intermittent BMP     Secondary Stroke Risk Optimization  > Antiplatelet/Anticoagulation: DAPT with aspirin 81mg  daily and clopidogrel 75mg  daily for 90 days followed by aspirin 81mg  indefinitely  > Blood Pressure Goal: <160 during acute hospitalization, long term goal of < 130/80               - Current antihypertensive medications: amlodipine 5mg  daily, lisinopril 20mg  daily, metoprolol 25mg  BID  > Lipids: LDL is 161, with goal of <70: rosuvastatin 40mg  daily (per Cardiology recs)  > Blood Glucose: Hgb A1c is 11.7%, with goal of <7  > Tobacco abstinence goal     At risk for Spasticity               > consider trial of baclofen if indicated, discuss potential benefits and side effects               > daily ROM with therapy      Dysphagia s/p PEG tube placement  Moderate Malnutrition  - PEG tube placement 5/15               > Dietician consult, appreciate tube feed recommendations- transition from continuous to bolus feeds 5/19  Recommend Jevity 1.5 bolus feeds, 5 cartons/day + 1 prosource pkt.     Recommend 1/2 carton per bolus for first 1-2 feeds, then proceed to goal regimen.    This will provide 1857kcal, 96g protein, free H2O.    Recommend 30mL free H2O flush before and after each bolus + 100mL q4h to meet fluid needs.   To help with loose stools, before interrupting EN support:    Avoid sorbitol containing medications such as liquid tylenol as these can make diarrhea worse; recommend switching to crushable tablet form.    Consider probiotic.    If loose stools persist, recommend adding 1-2 packets of nutrisource fiber per day.      Left Bell's Palsy  Left TMJ pain - Improved 5/28  Ear Wax B/L ear - Improved 5/28               > Alternate eye patches; eyepatch for the L eye overnight, artificial tears TID daily in the setting of left facial palsy   > lidocaine patch and tylenol to be continued as pt reports these improve left sided jaw pain   > Debrox added     Suicidal Ideation  Pseudobulbar Affect  Depression  - family denies knowledge of previous or current SI   > neuropsychology and psychiatry following   > Per Neurology: Consider starting Dextromethorphan-Quinidine in the outpatient setting (unfortunately unavailble on the formulary inpatient) for Pseudobulbar affect, will start Fluoxetine 20mg  in the meantime --> instead was started on Sertraline 50mg , will continue while in rehab  > Neurology clinic follow up on dc  > Per neuropsychology eval on 5/28: Pt?s reported mood was sad. Pt endorsed sadness and passive SI and also made a gesture of shooting himself. He reported that the last time he thought about it was yesterday. Will place psychiatry consult for SI and CO for safety. Neuropsychology plans to re-eval tomorrow.   > Psychiatry: continue CO, hold zoloft, continue to follow.      Oral Thrush               > oral care TID   > Nystatin swish and spit started 5/20, ok'd by SLP     Bowel Management  At risk for Neurogenic Bowel  Loose stools (resolved)  Constipation  -Last Bowel Movement Date: 11/09/23 (11/09/2023  6:47 AM)  - Goal for a daily BM or every 24-48 hours.   - c diff negative 5/18  > adjust bowel regimen PRN   > loperamide PRN  > Senna 2 tabs QHS via g tube  > MiraLax daily via g tube     Bladder Management  Neurogenic Bladder with Retention       -Post-Void (PVR) Bladder Scan (mL): 154 milliliters (11/08/2023 11:06 PM)  -Straight Cath (mL): 350 (11/07/2023 12:00 PM)  - One episode of self-void 5/28; doxazosin increased to 3mg   > Monitor urinary retention per unit protocol. If unable to void or PVRs >300cc perform clean ISC (intermittent straight catheterization) for volumes > . Stop checking BVIs if 3 consecutive volumes <150cc.   > Discuss foley placement vs ISC on Monday   >  doxazosin 3mg  QHS (last increase 5/28)     Hypertension  Orthostatic Hypotension  LVOT Obstruction  PFO  CAD s/p PCI               > metoprolol 25mg  BID               > amlodipine 5mg  daily,  lisinopril 20mg  daily               > 30 day MCOT    > compression stockings to prevent sx orthostatic hypotension               > Follow up with outpatient Cardiology      Diabetes Mellitus               > Endocrinology consult for ongoing discharge regimen planning    Aspiration Pneumonia - Resolved  S/p AHRF  - increased O2 req overnight 5/13 with elevated procal, tachycardia, leukocytosis  - CXR 5/13 notable for mild basal opacities, favored to represent atelectasis  - s/p vanc x1 and zosyn x3 days  - sputum cx NGTD  - Augmentin to complete total 7 day course of antibiotics through 5/20 AM    Nutrition:   Body mass index is 23.37 kg/m?SABRA  Malnutrition Details:  Malnutrition present on admission  ICD-10 code E44: Chronic illness/Moderate non-severe malnutrition      Mild loss of muscle mass, Mild loss of body fat, Energy intake: Less than 75% of estimated energy requirement for 1 month or more        Loss of Subcutaneous Fat: Yes Mild Orbital, Triceps  Muscle Wasting: Yes Moderate Clavicle, Temple, Deltoid               Malnutrition Interventions: EN recs to meet 100% estimated kcal and protein needs    Wounds:   Wounds Moisture associated skin damage Posterior Gluteal Cleft (Active)   Initial Assess Date/Initial Assess Time: 11/08/23 0900   Wound Type: Moisture associated skin damage  Orientation: Posterior  Location: Gluteal Cleft      Assessments 11/08/2023  8:00 PM 11/08/2023 10:00 PM   Wound Assessment Moist;Pink;Red;Non-blanchable Pink;Moist;Red   Peri-wound Assessment Intact Intact   Wound Drainage Amount None None   Wound Dressing Status None/open to air None/open to air   Wound Care Treatment or ointment applied Treatment or ointment applied   Wound Dressing and/or Treatment A & D ointment A & D ointment       No associated orders.       DVT ppx: enoxaparin (LOVENOX) syringe 40 mg  QDAY(21); patient will not require at the time of discharge    Code Status: Full Code    Current Medications:  Scheduled Meds:acetaminophen (TYLENOL) tablet 650 mg, 650 mg, Feeding Tube, Q6H*  amLODIPine (NORVASC) tablet 5 mg, 5 mg, Per NG tube, QDAY  artificial tears (PF) single dose ophthalmic solution 1 drop, 1 drop, Left Eye, TID  aspirin chewable tablet 81 mg, 81 mg, Per NG tube, QDAY  carbamide peroxide (DEBROX) 6.5 % otic solution 5 drop, 5 drop, Both Ears, BID  clopiDOGreL (PLAVIX) tablet 75 mg, 75 mg, Per NG tube, QDAY  diclofenac sodium (VOLTAREN) 1 % topical gel 2 g, 2 g, Topical, QID  Diet Enteral Feeding Bolus, , SEE ADMIN INSTRUCTIONS, QID (3,87,83,79)   And  Water Bolus, 100 mL, PEG Tube, QID (3,87,83,79)  doxazosin (CARDURA) tablet 3 mg, 3 mg, Feeding Tube, QHS  enoxaparin (LOVENOX) syringe 40 mg, 40 mg, Subcutaneous, QDAY(21)  insulin aspart (U-100) (NOVOLOG FLEXPEN U-100 INSULIN) injection PEN 0-12 Units, 0-12 Units, Subcutaneous, QID  insulin aspart (U-100) (NOVOLOG FLEXPEN U-100 INSULIN) injection PEN 16 Units, 16 Units, Subcutaneous, QID  insulin glargine (LANTUS SOLOSTAR U-100 INSULIN) injection PEN 18 Units, 18 Units, Subcutaneous, QDAY  lidocaine (LIDODERM) 5 % topical patch 1 patch, 1 patch, Topical, QDAY  lisinopriL (ZESTRIL) tablet 20 mg, 20 mg, Feeding Tube, QDAY  melatonin tablet 5 mg, 5 mg, Feeding Tube, QHS  metoprolol tartrate tablet 25 mg, 25 mg, Feeding Tube, BID  modafiniL (PROVIGIL) tablet 100 mg, 100 mg, Oral, BID  nystatin (MYCOSTATIN) oral suspension 500,000 Units, 500,000 Units, Swish & Spit, QID  petrolatum (STYE) ophthalmic ointment 0.25 inch, 0.25 inch, Both Eyes, TID  polyethylene glycol 3350 (MIRALAX) packet 17 g, 1 packet, Per G Tube, QDAY  rosuvastatin (CRESTOR) tablet 40 mg, 40 mg, Feeding Tube, QDAY  senna (SENOKOT) tablet 2 tablet, 2 tablet, Per G Tube, QHS  thiamine mononitrate (vit B1) tablet 100 mg, 100 mg, Per NG tube, QDAY    Continuous Infusions:  PRN and Respiratory Meds:bisacodyL QDAY PRN, dextrose 50% (D50) IV PRN, diclofenac sodium QID PRN, lidocaine hcl PRN, loperamide TID PRN, oxyCODONE Q12H PRN, pancrelipase 20,880 Units/sodium bicarbonate 650 mg ([place] CLOG DESTROYER) PRN (On Call from Rx), phenoL PRN      Subjective     No acute events overnight. Patient seen and examined in their room. Sleeping ok. Denies current pain by head shake. Difficult to assess mood due to limited engagement. Declined AM meds - encouraged to take.    Objective                          Vital Signs: Last Filed                 Vital Signs: 24 Hour Range   BP: 126/57 (05/31 0900)  Temp: 36.8 ?C (98.3 ?F) (05/31 0900)  Pulse: 78 (05/31 0900)  Respirations: 18 PER MINUTE (05/31 0611)  SpO2: 96 % (05/31 0900)  O2 Device: None (Room air) (05/31 0900) BP: (126-139)/(48-74)   Temp:  [36.7 ?C (98 ?F)-37.4 ?C (99.4 ?F)]   Pulse:  [72-78]   Respirations:  [16 PER MINUTE-18 PER MINUTE]   SpO2:  [96 %-97 %]   O2 Device: None (Room air)     Vitals:    10/28/23 1324 10/30/23 1511 11/04/23 0401   Weight: 59.7 kg (131 lb 9.8 oz) 59.4 kg (131 lb) 63.7 kg (140 lb 6.9 oz)       Intake/Output Summary:  (Last 24 hours)    Intake/Output Summary (Last 24 hours) at 11/09/2023 9062  Last data filed at 11/08/2023 2306  Gross per 24 hour   Intake 1662 ml   Output 1000 ml   Net 662 ml        Last Bowel Movement Date: 11/09/23    Labs--reviewed  Results for orders placed or performed during the hospital encounter of 10/28/23 (from the past 24 hours)   POC GLUCOSE    Collection Time: 11/08/23 12:17 PM   # # Low-High    Glucose, POC 200 (H) 70 - 100 mg/dL   POC GLUCOSE    Collection Time: 11/08/23  4:30 PM   # # Low-High    Glucose, POC 238 (H) 70 - 100 mg/dL   POC GLUCOSE    Collection Time: 11/08/23  9:57 PM   # # Low-High    Glucose, POC 137 (H) 70 - 100 mg/dL   POC GLUCOSE    Collection Time: 11/09/23  4:46 AM   # # Low-High    Glucose, POC 164 (H) 70 - 100 mg/dL   POC GLUCOSE    Collection Time: 11/09/23  6:47 AM   # # Low-High    Glucose, POC 153 (H) 70 -  100 mg/dL   CBC AND DIFF    Collection Time: 11/09/23  7:12 AM   # # Sharleen    White Blood Cells 11.90 (H) 4.50 - 11.00 10*3/uL    Red Blood Cells 3.78 (L) 4.40 - 5.50 10*6/uL    Hemoglobin 11.4 (L) 13.5 - 16.5 g/dL    Hematocrit 67.8 (L) 40.0 - 50.0 %    MCV 84.9 80.0 - 100.0 fL    MCH 30.2 26.0 - 34.0 pg    MCHC 35.6 32.0 - 36.0 g/dL    RDW 87.2 88.9 - 84.9 %    Platelet Count 211 150 - 400 10*3/uL    MPV 10.3 7.0 - 11.0 fL    Neutrophils 78.7 (H) 41.0 - 77.0 %    Lymphocytes 13.1 (L) 24.0 - 44.0 %    Monocytes 6.7 4.0 - 12.0 %    Eosinophils 1.0 0.0 - 5.0 % Basophils 0.5 0.0 - 2.0 %    Absolute Neutrophil Count 9.30 (H) 1.80 - 7.00 10*3/uL    Absolute Lymph Count 1.60 1.00 - 4.80 10*3/uL    Absolute Monocyte Count 0.80 0.00 - 0.80 10*3/uL    Absolute Eosinophil Count 0.10 0.00 - 0.45 10*3/uL    Absolute Basophil Count 0.10 0.00 - 0.20 10*3/uL         Physical Exam  General: Sleeping in bed in NAD, arousable but keeps eyes closed  HEENT: oral mucosa moist; No TTP over TMJ  Heart: Extremities warm and well perfused  Lungs: Normal work of breathing on room air  Abdomen: Soft, non distended, no tenderness to palpation, +G tube without erythema or drainage  Extremities: Peripheral edema is not present  Psych: mood and affect difficult to assess, limited engagement  Neuro: At least antigravity shoulder abduction bilaterally, at least antigravity hip flexion and knee extension. Hypophonic and dysarthric, mostly nods/shakes head for communication. Left facial paralysis, bilateral horizontal gaze palsy appreciated.     Labs & Therapy Notes Reviewed    Cordella CHRISTELLA Stanford, MD   Physical Medicine & Rehabilitation  For patient care questions contact Rehab Medicine 3 FIRST CALL via Voalte.

## 2023-11-10 LAB — POC GLUCOSE
~~LOC~~ BKR POC GLUCOSE: 266 mg/dL — ABNORMAL HIGH (ref 70–100)
~~LOC~~ BKR POC GLUCOSE: 143 mg/dL — ABNORMAL HIGH (ref 70–100)
~~LOC~~ BKR POC GLUCOSE: 158 mg/dL — ABNORMAL HIGH (ref 70–100)
~~LOC~~ BKR POC GLUCOSE: 174 mg/dL — ABNORMAL HIGH (ref 70–100)
~~LOC~~ BKR POC GLUCOSE: 323 mg/dL — ABNORMAL HIGH (ref 70–100)

## 2023-11-10 LAB — CBC
~~LOC~~ BKR HEMATOCRIT: 32 % — ABNORMAL LOW (ref 40.0–50.0)
~~LOC~~ BKR HEMOGLOBIN: 11 g/dL — ABNORMAL LOW (ref 13.5–16.5)

## 2023-11-10 NOTE — Progress Notes
 Endocrinology Progress Note    Kevin Obrien  Date of Admission: 10/28/2023    Impression:  Type 2 Diabetes Mellitus uncontrolled  Non adherance to meds  Acute stroke   Dyslipidemia   Tube feed hyperglycemia   A1c 11.7, 10/17/2023   PTA regimen: non compliant, was supposed to be on meal time insulin. Was taking very sporadically   Was not taking any PO meds either   Hypoglycemic episodes on this regimen: none   Follows up with ? PCP for diabetes management  Diabetic-complications assessment:  CAD, stroke, neuropathy      Aspiration pneumonia     Current diet: Jevity 1.5 TF QID  Glucocorticoids: none     Recommendations/Plan:  No changes today  Continue insulin glargine 18u every day   Continue Aspart 14 units with each bolus feed  Continue mid dose correction factor to QID with bolus feeds     Diabetes Plans Post Discharge  Diabetes regimen: TBD  He will most likely need insulin on discharge.   We will request for outpatient follow-up once close to be discharge. Family would like to come to [facility]. He used to see [facility] and last visit was in 2020  __________________________________________________________________________________  Subjective:  Kevin Obrien is a 73 y.o. male. No acute events overnight. No hypoglycemic episodes. Family at bedside who reported that he is doing about the same today. He tolerates bolus feeds well.      On MND:izwpzd nausea, vomiting.       Objective:                       Vital Signs: Last Filed                 Vital Signs: 24 Hour Range   BP: 157/63 (05/31 2131)  Temp: 37.2 ?C (99 ?F) (05/31 2131)  Pulse: 82 (05/31 2131)  Respirations: 17 PER MINUTE (05/31 2131)  SpO2: 99 % (05/31 2131)  O2 Device: None (Room air) (05/31 1504) BP: (140-157)/(63-71)   Temp:  [37.1 ?C (98.7 ?F)-37.2 ?C (99 ?F)]   Pulse:  [75-82]   Respirations:  [16 PER MINUTE-17 PER MINUTE]   SpO2:  [98 %-99 %]   O2 Device: None (Room air)      Intake/Output Summary (Last 24 hours) at 11/10/2023 1125  Last data filed at 11/10/2023 0945  Gross per 24 hour   Intake 1268 ml   Output 775 ml   Net 493 ml         Physical Examination:  GEN: Awake, no apparent distress  HEENT: No scleral icterus/pallor  RESP:breathing comfortably  EXT: No edema      Labs:  Recent Labs     11/08/23  0648 11/10/23  0626   NA 135* 132*   K 4.3 4.4   CL 97* 97*   CO2 25 26   GAP 13* 9   BUN 26* 21   CR 0.70 0.77   GLU 166* 163*   CA 9.4 9.1       Recent Labs     11/08/23  0648 11/09/23  0712 11/10/23  0626   WBC 11.30* 11.90* 11.80*   HGB 12.3* 11.4* 11.5*   HCT 34.2* 32.1* 32.9*   PLTCT 200 211 237     Lipid Profile:   Lab Results   Component Value Date    CHOL 216 10/17/2023    CHOL 159 09/01/2015    TRIG 219  10/17/2023    TRIG 299 09/01/2015    HDL 36 10/17/2023    HDL 27 09/01/2015    LDL 161 10/17/2023    LDL 84 09/01/2015    VLDL 56.1 10/17/2023    VLDL 60 09/01/2015      Estimated Creatinine Clearance: 75.8 mL/min (based on SCr of 0.77 mg/dL).  Vitals:    10/28/23 1324 10/30/23 1511 11/04/23 0401   Weight: 59.7 kg (131 lb 9.8 oz) 59.4 kg (131 lb) 63.7 kg (140 lb 6.9 oz)    No results for input(s): PHART, PO2ART in the last 72 hours.    Invalid input(s): PC02A      Thyroid Studies    Lab Results   Component Value Date/Time    TSH 0.40 06/05/2023 08:09 PM    No results found for: FREET3, T3BINDING, THYBINDGLB                    Medsacetaminophen, 650 mg, Feeding Tube, Q6H*  amLODIPine, 5 mg, Per NG tube, QDAY  artificial tears (PF) single dose, 1 drop, Left Eye, TID  aspirin, 81 mg, Per NG tube, QDAY  carbamide peroxide, 5 drop, Both Ears, BID  clopiDOGreL, 75 mg, Per NG tube, QDAY  diclofenac sodium, 2 g, Topical, QID  Tube Feed Bolus Documentation, , SEE ADMIN INSTRUCTIONS, QID (3,87,83,79)   And  Diet - Water Bolus, 100 mL, PEG Tube, QID (3,87,83,79)  doxazosin, 3 mg, Feeding Tube, QHS  enoxaparin, 40 mg, Subcutaneous, QDAY(21)  insulin aspart (NOVOLOG) injection, 0-12 Units, Subcutaneous, QID  insulin aspart (NOVOLOG) injection, 16 Units, Subcutaneous, QID  insulin glargine (LANTUS) injection, 18 Units, Subcutaneous, QDAY  lidocaine, 1 patch, Topical, QDAY  lisinopril, 20 mg, Feeding Tube, QDAY  melatonin, 5 mg, Feeding Tube, QHS  metoprolol tartrate, 25 mg, Feeding Tube, BID  modafiniL, 100 mg, Oral, BID  nystatin, 500,000 Units, Swish & Spit, QID  petrolatum, 0.25 inch, Both Eyes, TID  polyethylene glycol 3350, 1 packet, Per G Tube, QDAY  rosuvastatin, 40 mg, Feeding Tube, QDAY  senna, 2 tablet, Per G Tube, QHS  thiamine mononitrate (vit B1), 100 mg, Per NG tube, QDAY  trimethoprim/sulfa  (BACTRIM; SEPTRA) oral suspension, 160 mg, Feeding Tube, BID     IV MEDS  PrnbisacodyL QDAY PRN, dextrose 50% (D50) IV PRN, diclofenac sodium QID PRN, lidocaine hcl PRN, loperamide TID PRN, oxyCODONE Q12H PRN, pancrelipase 20,880 Units/sodium bicarbonate 650 mg ([place] CLOG DESTROYER) PRN (On Call from Rx), phenoL PRN     Lura Monica, MD  Pager # [phone]  11/10/2023

## 2023-11-10 NOTE — Progress Notes
 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 Afebrile. Voiding overnight. Some documented reporting of cloudy urine - will obtain UA w/reflex. Declined AM meds. Encouraged to take later in day as wakes up.    AustinBETHA PERKINS. Initiating Bactrim 160 mg BID.  Awaiting sensitivities.                     Assessment/Plan:     Kevin Obrien is a 73 y.o.  male admitted to [facility] on 10/28/2023 with the following issues: stroke      Rehabilitation Plan  Tentative discharge date: 11/19/2023  Rehabilitation: Patient will continue with comprehensive therapies at modified therapy schedule including physical therapy, occupational therapy, speech & language pathology, specialized rehab nursing, neuropsychology and physiatry oversight.    Goals:  Patient Will Perform Basic Cares With: Partial/moderate assistance, Progressing  Patient Will Perform Household Mobility With: Partial/moderate assistance, At wheelchair level  Recommended therapy after discharge: Consistent assistance, Home setting with home health, Versus, Skilled nursing facility  PT recommended equipment: Too early to determine (wheelchair Web designer))  OT recommended equipment: Tub transfer bench    Daily Functional Update:  Stand to Sit Assist Level: Partial/moderate assistance (11/09/2023 10:15 AM)  Equipment: Hand hold assist (11/09/2023 10:15 AM)     Stand Pivot Assist Level: Partial/moderate assistance (11/09/2023 10:15 AM)  Equipment: Hand hold assist (11/09/2023 10:15 AM)     Slideboard No data recorded  No data recorded   Gait Distance: 25 (11/07/2023 10:10 AM)    Assist Level: Dependent (11/05/2023  2:45 PM)    Equipment: Body weight supported; Hand hold assist (11/07/2023 10:10 AM)     Wheelchair Distance 1: From room to gym (11/07/2023 10:10 AM)    Assist Level: Substantial/maximal assistance (11/07/2023 10:10 AM)     Toileting       Assist Level: Dependent (11/09/2023 10:15 AM)  Equipment: -- Jayson Steady) (11/07/2023 10:10 AM)     Toilet Transfer No data recorded   Upper Body Dressing Assist Level: Substantial/maximal assistance (10/29/2023  1:00 PM)     Lower Body Dressing Assist Level: Substantial/maximal assistance (11/08/2023  1:00 PM)       Diet: PO: Ice chips only (11/07/2023  1:30 PM)      Orientation Log & Cognitive Log Scores for the past 72 hrs (Last 3 readings):   Orientation Log  Score (Out of 30)   11/08/23 1510 12/30             Current Medical Problems/Risks of Medical Complications/Management    Acute Right Pontomedullary Ischemic Stroke  Proximal Right Intracranial Vertebral Artery Occlusion  Diffuse Intracranial Atherosclerosis  Bilateral Horizontal Gaze Palsy  History of Forced Left Gaze Deviation  Left Facial Palsy  Impairments: left hemiparesis, expressive aphasia, oropharyngeal dysphagia, right facial weakness, left facial droop with dysarthria, diplopia, concern for attention/cognitive impairments, LUE ataxia, neurogenic bowel, neurogenic bladder  Impaired mobility and ADL, impaired swallow and cognition/communication               > consult PT/OT/SLP and neuropsychology to evaluate and treat               > Increase frequency of Modafinil to 100mg  BID (0600, 1200) - monitor response (adjusted time to 0600 for 5/30)   > Zoloft 50mg  daily - discontinued 5/29   > Videoswallow study 5/29- silently aspirated thin liquids and penetrated mildly thick liquids, too much oral weakness/uncoordination to consider dysphagia diet safely at this point; continue NPO at this time, continue to encourage ice chips throughout the day under supervision               > Sleep optimization: melatonin 5mg  qhs, previously on seroquel nightly               > follow up: Neurology, PM&R, Cardiology     Daytime Sleepiness - Improved  Fatigue - Improved  Increased Somnolence - Resolved  - Concern for increased somnolence 5/20 leading to Neurology Consult- rec CTA H&N; discussed with team may attempt to liberalize BP I.e. decreasing amlodipine to 5mg  due to symptoms possibly 2/2 to hypoperfusion  - CTA Head and Neck - negative for acute change  - Infectious workup including UA, CXR- negative for acute concerns  > appreciate further neurology recommendations- if acute focal weakness, numbness, coma, etc. Stroke activate/send to ED as he is high risk to have progression of his stroke/basilar involvement. Signed off.   > monitor on modafinil BID    Hyponatremia  Hyperkalemia- Resolved  - K 5.2 5/28 AM  - K 4.3 5/29 AM  - s/p lokelma x1 5/28   > monitor intermittent BMP     Secondary Stroke Risk Optimization  > Antiplatelet/Anticoagulation: DAPT with aspirin 81mg  daily and clopidogrel 75mg  daily for 90 days followed by aspirin 81mg  indefinitely  > Blood Pressure Goal: <160 during acute hospitalization, long term goal of < 130/80               - Current antihypertensive medications: amlodipine 5mg  daily, lisinopril 20mg  daily, metoprolol 25mg  BID  > Lipids: LDL is 161, with goal of <70: rosuvastatin 40mg  daily (per Cardiology recs)  > Blood Glucose: Hgb A1c is 11.7%, with goal of <7  > Tobacco abstinence goal     At risk for Spasticity               > consider trial of baclofen if indicated, discuss potential benefits and side effects               > daily ROM with therapy      Dysphagia s/p PEG tube placement  Moderate Malnutrition  - PEG tube placement 5/15               > Dietician consult, appreciate tube feed recommendations- transition from continuous to bolus feeds 5/19  Recommend Jevity 1.5 bolus feeds, 5 cartons/day + 1 prosource pkt.     Recommend 1/2 carton per bolus for first 1-2 feeds, then proceed to goal regimen.    This will provide 1857kcal, 96g protein, free H2O.    Recommend 30mL free H2O flush before and after each bolus + 100mL q4h to meet fluid needs.   To help with loose stools, before interrupting EN support:    Avoid sorbitol containing medications such as liquid tylenol as these can make diarrhea worse; recommend switching to crushable tablet form.    Consider probiotic.    If loose stools persist, recommend adding 1-2 packets of nutrisource fiber per day.      Left Bell's Palsy  Left TMJ pain - Improved 5/28  Ear Wax B/L ear - Improved 5/28               > Alternate eye patches; eyepatch for the L eye overnight, artificial tears TID daily in the setting of left facial palsy   > lidocaine patch and tylenol to be continued as pt reports these improve left sided jaw pain   > Debrox added     Suicidal Ideation  Pseudobulbar Affect  Depression  - family denies knowledge of previous or current SI   > neuropsychology and psychiatry following   > Per Neurology: Consider starting Dextromethorphan-Quinidine in the outpatient setting (unfortunately unavailble on the formulary inpatient) for Pseudobulbar affect, will start Fluoxetine 20mg  in the meantime --> instead was started on Sertraline 50mg , will continue while in rehab  > Neurology clinic follow up on dc  > Per neuropsychology eval on 5/28: Pt?s reported mood was sad. Pt endorsed sadness and passive SI and also made a gesture of shooting himself. He reported that the last time he thought about it was yesterday. Will place psychiatry consult for SI and CO for safety. Neuropsychology plans to re-eval tomorrow.   > Psychiatry: continue CO, hold zoloft, continue to follow.      Oral Thrush               > oral care TID   > Nystatin swish and spit started 5/20, ok'd by SLP     Bowel Management  At risk for Neurogenic Bowel  Loose stools (resolved)  Constipation  -Last Bowel Movement Date: 11/09/23 (11/09/2023 10:30 PM)  - Goal for a daily BM or every 24-48 hours.   - c diff negative 5/18  > adjust bowel regimen PRN   > loperamide PRN  > Senna 2 tabs QHS via g tube  > MiraLax daily via g tube     Bladder Management  Neurogenic Bladder with Retention       -Post-Void (PVR) Bladder Scan (mL): 250 milliliters (11/09/2023  6:35 PM)  -Straight Cath (mL): 350 (11/07/2023 12:00 PM)  - One episode of self-void 5/28; doxazosin increased to 3mg   > Monitor urinary retention per unit protocol. If unable to void or PVRs >300cc perform clean ISC (intermittent straight catheterization) for volumes > . Stop checking BVIs if 3 consecutive volumes <150cc.   > Discuss foley placement vs ISC on Monday   >  doxazosin 3mg  QHS (last increase 5/28)     Hypertension  Orthostatic Hypotension  LVOT Obstruction  PFO  CAD s/p PCI               > metoprolol 25mg  BID               > amlodipine 5mg  daily,  lisinopril 20mg  daily               > 30 day MCOT    > compression stockings to prevent sx orthostatic hypotension               > Follow up with outpatient Cardiology      Diabetes Mellitus               > Endocrinology consult for ongoing discharge regimen planning    Aspiration Pneumonia - Resolved  S/p AHRF  - increased O2 req overnight 5/13 with elevated procal, tachycardia, leukocytosis  - CXR 5/13 notable for mild basal opacities, favored to represent atelectasis  - s/p vanc x1 and zosyn x3 days  - sputum cx NGTD  - Augmentin to complete total 7 day course of antibiotics through 5/20 AM    Nutrition:   Body mass index is 23.37 kg/m?SABRA  Malnutrition Details:  Malnutrition present on admission  ICD-10 code E44: Chronic illness/Moderate non-severe malnutrition      Mild loss of muscle mass, Mild loss of body fat, Energy intake: Less than 75% of estimated energy requirement for 1 month or more        Loss of Subcutaneous Fat: Yes Mild Orbital, Triceps  Muscle Wasting: Yes Moderate Clavicle, Temple, Deltoid               Malnutrition Interventions: EN recs to meet 100% estimated kcal and protein needs    Wounds:   Wounds Moisture associated skin damage Posterior Gluteal Cleft (Active)   Initial Assess Date/Initial Assess Time: 11/08/23 0900   Wound Type: Moisture associated skin damage  Orientation: Posterior  Location: Gluteal Cleft      Assessments 11/08/2023  8:00 PM 11/09/2023  8:00 PM   Wound Assessment Moist;Pink;Red;Non-blanchable --   Peri-wound Assessment Intact Intact   Wound Drainage Amount None None   Wound Dressing Status None/open to air None/open to air   Wound Care Treatment or ointment applied Treatment or ointment applied   Wound Dressing and/or Treatment A & D ointment A & D ointment       No associated orders.       DVT ppx: enoxaparin (LOVENOX) syringe 40 mg  QDAY(21); patient will not require at the time of discharge    Code Status: Full Code    Current Medications:  Scheduled Meds:acetaminophen (TYLENOL) tablet 650 mg, 650 mg, Feeding Tube, Q6H*  amLODIPine (NORVASC) tablet 5 mg, 5 mg, Per NG tube, QDAY  artificial tears (PF) single dose ophthalmic solution 1 drop, 1 drop, Left Eye, TID  aspirin chewable tablet 81 mg, 81 mg, Per NG tube, QDAY  carbamide peroxide (DEBROX) 6.5 % otic solution 5 drop, 5 drop, Both Ears, BID  clopiDOGreL (PLAVIX) tablet 75 mg, 75 mg, Per NG tube, QDAY  diclofenac sodium (VOLTAREN) 1 % topical gel 2 g, 2 g, Topical, QID  Diet Enteral Feeding Bolus, , SEE ADMIN INSTRUCTIONS, QID (3,87,83,79)   And  Water Bolus, 100 mL, PEG Tube, QID (3,87,83,79)  doxazosin (CARDURA) tablet 3 mg, 3 mg, Feeding Tube, QHS  enoxaparin (LOVENOX) syringe 40 mg, 40 mg, Subcutaneous, QDAY(21)  insulin aspart (U-100) (NOVOLOG FLEXPEN U-100 INSULIN) injection PEN 0-12 Units, 0-12 Units, Subcutaneous, QID  insulin aspart (U-100) (NOVOLOG FLEXPEN U-100 INSULIN) injection PEN 16 Units, 16 Units, Subcutaneous, QID  insulin glargine (LANTUS SOLOSTAR U-100 INSULIN) injection PEN 18 Units, 18 Units, Subcutaneous, QDAY  lidocaine (LIDODERM) 5 % topical patch 1 patch, 1 patch, Topical, QDAY  lisinopriL (ZESTRIL) tablet 20 mg, 20 mg, Feeding Tube, QDAY  melatonin tablet 5 mg, 5 mg, Feeding Tube, QHS  metoprolol tartrate tablet 25 mg, 25 mg, Feeding Tube, BID  modafiniL (PROVIGIL) tablet 100 mg, 100 mg, Oral, BID  nystatin (MYCOSTATIN) oral suspension 500,000 Units, 500,000 Units, Swish & Spit, QID  petrolatum (STYE) ophthalmic ointment 0.25 inch, 0.25 inch, Both Eyes, TID  polyethylene glycol 3350 (MIRALAX) packet 17 g, 1 packet, Per G Tube, QDAY  rosuvastatin (CRESTOR) tablet 40 mg, 40 mg, Feeding Tube, QDAY  senna (SENOKOT) tablet 2 tablet, 2 tablet, Per G Tube, QHS  thiamine mononitrate (vit B1) tablet 100 mg, 100 mg, Per NG tube, QDAY  trimethoprim/sulfamethoxazole (BACTRIM) oral suspension 160 mg, 160 mg, Feeding Tube, BID    Continuous Infusions:  PRN and Respiratory Meds:bisacodyL QDAY PRN, dextrose 50% (D50) IV PRN, diclofenac sodium QID PRN, lidocaine hcl PRN, loperamide TID PRN, oxyCODONE Q12H PRN, pancrelipase 20,880 Units/sodium bicarbonate 650 mg ([place] CLOG DESTROYER) PRN (On Call from Rx), phenoL PRN      Subjective     No acute events overnight. Patient seen and examined in their room. Sleeping ok. Wife at bedside. Notes some intermittent confusion from patient. Took medication after initially refusing yesterday.    Objective Vital Signs: Last Filed                 Vital Signs: 24 Hour Range   BP: 157/63 (05/31 2131)  Temp: 37.2 ?C (99 ?F) (05/31 2131)  Pulse: 82 (05/31 2131)  Respirations: 17 PER MINUTE (05/31 2131)  SpO2: 99 % (05/31 2131)  O2 Device: None (Room air) (05/31 1504) BP: (126-157)/(57-71)   Temp:  [36.8 ?C (98.3 ?F)-37.2 ?C (99 ?F)]   Pulse:  [75-82]   Respirations:  [16 PER MINUTE-17 PER MINUTE]   SpO2:  [96 %-99 %]   O2 Device: None (Room air)     Vitals:    10/28/23 1324 10/30/23 1511 11/04/23 0401   Weight: 59.7 kg (131 lb 9.8 oz) 59.4 kg (131 lb) 63.7 kg (140 lb 6.9 oz)       Intake/Output Summary:  (Last 24 hours)    Intake/Output Summary (Last 24 hours) at 11/10/2023 0830  Last data filed at 11/10/2023 0600  Gross per 24 hour   Intake 1328 ml   Output 400 ml   Net 928 ml        Last Bowel Movement Date: 11/09/23    Labs--reviewed  Results for orders placed or performed during the hospital encounter of 10/28/23 (from the past 24 hours)   POC GLUCOSE    Collection Time: 11/09/23 12:17 PM   # # Low-High    Glucose, POC 169 (H) 70 - 100 mg/dL   URINALYSIS DIPSTICK REFLEX TO CULTURE    Collection Time: 11/09/23 12:40 PM    Specimen: Midstream; Urine   # # Low-High    Color,UA Yellow     Turbidity,UA 2+ (A) Clear    Specific Gravity-Urine 1.011 1.005 - 1.030    pH,UA 7.0 5.0 - 8.0    Protein,UA 1+ (A) Negative    Glucose,UA 1+ (A) Negative    Ketones,UA Negative Negative    Bilirubin,UA Negative Negative    Blood,UA 2+ (A) Negative    Urobilinogen,UA Normal Normal    Nitrite,UA Positive (A) Negative    Leukocytes,UA 3+ (A) Negative   URINALYSIS MICROSCOPIC REFLEX TO CULTURE    Collection Time: 11/09/23 12:40 PM    Specimen: Midstream; Urine   # #  Low-High    WBCs,UA Packed (A) None, 0 - 2  /HPF    RBCs,UA 2 - 10 (A) None, 0 - 2  /HPF    Bacteria,UA Many (A) None /HPF    WBC Clumps Present (A) None /HPF   POC GLUCOSE    Collection Time: 11/09/23  4:37 PM   # # Low-High    Glucose, POC 232 (H) 70 - 100 mg/dL POC GLUCOSE    Collection Time: 11/09/23  9:33 PM   # # Low-High    Glucose, POC 266 (H) 70 - 100 mg/dL   POC GLUCOSE    Collection Time: 11/10/23  4:05 AM   # # Low-High    Glucose, POC 158 (H) 70 - 100 mg/dL   POC GLUCOSE    Collection Time: 11/10/23  5:37 AM   # # Low-High    Glucose, POC 143 (H) 70 - 100 mg/dL   BASIC METABOLIC PANEL    Collection Time: 11/10/23  6:26 AM   # # Low-High    Sodium 132 (L) 137 - 147 mmol/L    Potassium 4.4 3.5 - 5.1 mmol/L    Chloride 97 (L) 98 - 110 mmol/L    Glucose 163 (H) 70 - 100 mg/dL    Blood Urea Nitrogen 21 7 - 25 mg/dL    Creatinine 9.22 9.59 - 1.24 mg/dL    Calcium 9.1 8.5 - 89.3 mg/dL    CO2 26 21 - 30 mmol/L    Anion Gap 9 3 - 12    Glomerular Filtration Rate (GFR) >60 >60 mL/min   CBC    Collection Time: 11/10/23  6:26 AM   # # Sharleen    White Blood Cells 11.80 (H) 4.50 - 11.00 10*3/uL    Red Blood Cells 3.87 (L) 4.40 - 5.50 10*6/uL    Hemoglobin 11.5 (L) 13.5 - 16.5 g/dL    Hematocrit 67.0 (L) 40.0 - 50.0 %    MCV 84.9 80.0 - 100.0 fL    MCH 29.7 26.0 - 34.0 pg    MCHC 35.0 32.0 - 36.0 g/dL    RDW 87.0 88.9 - 84.9 %    Platelet Count 237 150 - 400 10*3/uL    MPV 9.8 7.0 - 11.0 fL         Physical Exam  General: Sleeping in bed in NAD, arousable but keeps eyes closed  HEENT: oral mucosa moist; No TTP over TMJ  Heart: Extremities warm and well perfused  Lungs: Normal work of breathing on room air  Abdomen: Soft, non distended, no tenderness to palpation, +G tube without erythema or drainage  Extremities: Peripheral edema is not present  Psych: mood and affect difficult to assess, limited engagement  Neuro: At least antigravity shoulder abduction bilaterally, at least antigravity hip flexion and knee extension. Hypophonic and dysarthric, mostly nods/shakes head for communication. Left facial paralysis, bilateral horizontal gaze palsy appreciated.     Labs & Therapy Notes Reviewed    Cordella CHRISTELLA Stanford, MD   Physical Medicine & Rehabilitation  For patient care questions contact Rehab Medicine 3 FIRST CALL via Voalte.

## 2023-11-10 NOTE — Progress Notes
 Acute Inpatient Rehabilitation Behavior Summary of Shift    Name: Kevin Obrien   MRN: [id number]     DOB: January 10, 1951      Age: 73 y.o.  Admission Date: 10/28/2023     LOS: 13 days     Date of Service: 11/10/2023      Reason for increased observation? SI  One-on-one safety watch: Yes  Q15-30 minute safety watch: No  Suicide precaution: Yes  Telemonitoring: No    Behavior outbursts during this shift? No  Description and time(s) of event: n/a   Agitation during this shift? Yes  Description and time(s) of event: Family and CO reported patient hitting at them this afternoon. I have not witnessed these behaviors.       Active suicide ideation during this shift? No   Description and time(s) of event: none voiced    Poor safety awareness or impulsivity during this shift? No  Description and time(s) of event: n/a    Noteworthy visitor interactions during this shift? Daughter at bedside most of morning, left around noon, another daughter and wife visiting this evening since around 1400.   Sleep/rest patterns observed during this shift? Noted to have periods of sleep throughout thr day.   Therapeutic interventions:    Redirection  Additional Comments: n/a

## 2023-11-10 NOTE — Progress Notes
 Acute Inpatient Rehabilitation Behavior Summary of Shift    Name: Kevin Obrien   MRN: [id number]     DOB: May 20, 1951      Age: 73 y.o.  Admission Date: 10/28/2023     LOS: 13 days     Date of Service: 11/10/2023      Reason for increased observation?poor safety awareness  One-on-one safety watch: Yes  Q15-30 minute safety watch: Yes  Suicide precaution: No  Telemonitoring: No    Behavior outbursts during this shift? No  Description and time(s) of event: na   Agitation during this shift? No  Description and time(s) of event: na      Active suicide ideation during this shift? No   Description and time(s) of event: na    Poor safety awareness or impulsivity during this shift? No  Description and time(s) of event: na    Noteworthy visitor interactions during this shift? daughtert  Sleep/rest patterns observed during this shift? Sleeps at night  Therapeutic interventions:    Reorientation  Additional Comments: na

## 2023-11-11 LAB — POC GLUCOSE
~~LOC~~ BKR POC GLUCOSE: 282 mg/dL — ABNORMAL HIGH (ref 70–100)
~~LOC~~ BKR POC GLUCOSE: 137 mg/dL — ABNORMAL HIGH (ref 70–100)
~~LOC~~ BKR POC GLUCOSE: 188 mg/dL — ABNORMAL HIGH (ref 70–100)
~~LOC~~ BKR POC GLUCOSE: 302 mg/dL — ABNORMAL HIGH (ref 70–100)

## 2023-11-11 LAB — BASIC METABOLIC PANEL CELLULAR THERAPEUTICS
~~LOC~~ BKR CHLORIDE: 96 mmol/L — ABNORMAL LOW (ref 98–110)
~~LOC~~ BKR POTASSIUM: 4.9 mmol/L — ABNORMAL LOW (ref 3.5–5.1)
~~LOC~~ BKR SODIUM, SERUM: 133 mmol/L — ABNORMAL LOW (ref 137–147)

## 2023-11-11 LAB — CBC CELLULAR THERAPEUTICS
~~LOC~~ BKR RBC COUNT: 3.5 10*6/uL — ABNORMAL LOW (ref 4.40–5.50)
~~LOC~~ BKR WBC COUNT: 8.8 10*3/uL — ABNORMAL HIGH (ref 4.50–11.00)

## 2023-11-11 LAB — CULTURE-URINE W/SENSITIVITY: ~~LOC~~ BKR URINE CULTURE: >10 — AB

## 2023-11-11 MED ORDER — TRIMETHOPRIM-SULFAMETHOXAZOLE 40-200 MG/5 ML PO SUSP
160 mg | Freq: Two times a day (BID) | ORAL | 0 refills | Status: CP
Start: 2023-11-11 — End: ?
  Administered 2023-11-12 (×2): 160 mg via ORAL

## 2023-11-11 MED ORDER — SERTRALINE 50 MG PO TAB
50 mg | Freq: Every day | ORAL | 0 refills | Status: DC
Start: 2023-11-11 — End: 2023-11-13
  Administered 2023-11-11 – 2023-11-13 (×3): 50 mg via ORAL

## 2023-11-11 MED ORDER — TRAZODONE 50 MG PO TAB
25 mg | Freq: Every evening | GASTROSTOMY | 0 refills | Status: DC
Start: 2023-11-11 — End: 2023-11-12

## 2023-11-11 MED ORDER — ARTIFICIAL TEARS (PF) SINGLE DOSE DROPS GROUP
2 [drp] | Freq: Three times a day (TID) | OPHTHALMIC | 0 refills | Status: DC
Start: 2023-11-11 — End: 2023-11-22
  Administered 2023-11-11 – 2023-11-21 (×18): 2 [drp] via OPHTHALMIC

## 2023-11-11 NOTE — Progress Notes
 OCCUPATIONAL THERAPY  NOTE   Name: Kevin Obrien   MRN: [id number]     DOB: 05-Sep-1950      Age: 73 y.o.  Admission Date: 10/28/2023     LOS: 14 days     Date of Service: 11/11/2023         11/11/23 1300   Type of Note   Type of Note Daily   Time Calculation   Start Time 1300   Stop Time 1330   Time Calculation 30   $$ Phys ADL Skills 1 unit   $$ Therapeutic Activity 1 unit - 15 min   Subjective   Subjective Pt seated in bed on arrival and agreeable to OT with encouragement. Pt seated in wheelchair on exit with alarm on and needs met/in reach   Precautions   Comments L sided weakness, visual impairment (horizontal gaze palsy), PEG   Cognitive   Patient Behavior Withdrawn;Not interactive   Family Behavior Supportive   Cognition Decrease attention/ concentration   Footwear   Patient Needs Assistance With Shoes: applying or removing left;Shoes: fastening or unfastening right   Assist Level Dependent   Position Sitting edge of bed   Comments total assist to don shoes   Lying to Sitting on Side of Bed   Patient Needs Assistance With Trunk;Extra time to complete the activity   Assist Level Partial/moderate assistance   Equipment With rails;HOB elevated   Sit to and from Stand   Sit to and from Stand Sit to stand;Stand to sit   Sit to Stand   Patient Needs Assistance With Facilitation of hip extension;Facilitation of trunk;Facilitation of weight shift;Balance;Cues for sequencing;Cues for positioning of hands;Extra time to complete activity;Wheelchair management;Cues for positioning of feet   Assist Level Partial/moderate Merchandiser, retail hold assist   Stand to Sit   Patient Needs Assistance With Facilitation of weight shift;Balance;Cues for sequencing;Extra time to complete activity;Financial planner;Facilitation of hip flexion;Facilitation of trunk;Cues for positioning of hands   Assist Level Partial/moderate assistance   Equipment Hand hold assist   Comments cues for hand placement prior to sitting Chair/Bed-to-Chair Transfer   Chair/Bed-to-Chair Transfer Stand pivot   Stand Pivot Transfer   Patient Needs Assistance With Extra time to complete activity;Financial planner;Facilitation of hip flexion;Facilitation of trunk;Facilitation of weight shift;Cues for positioning of feet;Cues for positioning of hands;Cues for sequencing;Balance;Facilitation of hip extension   Assist Level Partial/moderate assistance   Equipment Hand hold assist   Comments bed > wheelchair   Therapeutic Interventions   Therapeutic Activities Pt engaged in leisure activity outside on roof top with encouragement. Pt withdrawn throughout activity and declining music and minimal verbal communication.   Assessment   Assessment Patient continues to benefit from increased time and encouragement during sessions. Progressing with stand pivot transfers. Continue progressing towards goals   Weekly OT Progress progressing with stand pivot transfers, tooth brushing, sitting balance/trunk control   Weekly OT Barriers/Concerns continued L face/throat/neck pain, vision deficits, mood, maximal to dependent assist for most ADLs   Plan   OT Plan LUE NMR, BUE coordination/strengthening, visual scanning, dynamic sitting balance, functional transfers, standing balance, standing for ADLs as able, all ADLs   Recommendations   OT Discharge Recommendations Consistent supervision;Home setting with home health;Versus;Skilled nursing facility         Therapist: Phyliss Quale, OTD, OTR/L  Date: 11/11/2023

## 2023-11-11 NOTE — Progress Notes
 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)    Postherpetic neuralgia    Essential tremor    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    Poorly controlled diabetes mellitus (CMS-HCC)    Nephrotic range proteinuria    Ischemic stroke (CMS-HCC)    Moderate malnutrition      Endocrine Problem List:  Type 2 Diabetes - uncontrolled   A1C:  11.7% on 5.8.2025   Diabetes provider:  PCP   PTA meds:  as per below  Diabetes complications to include:   CAD / CVA / neuropathy / nephropathy / retinopathy  On enteral feeds    Recommendations:  Glargine 18 units SQ daily at noon  Aspart 16 units SQ QID with bolus feeds  Aspart correction factor 4x daily prior to bolus feeds - mid dose  ________________________________________________________________________    HPI:  Kevin Obrien is a 73 y.o. male.  Patient is new to me this morning.  Chart reviewed / old records reviewed / labs reviewed / imaging reviewed.  I have summarized pertinent information in this daily note.    History obtained from RN today.  She relays that Kevin Obrien is tolerating enteral feeds.  FSBS acceptable today.  Tired after therapy today.    Diet:  NPO x/ ice chips  Nutrition:    Diet Enteral Feeding Bolus  (Enteral Feeding Bolus With Water Bolus)     Discontinue     Question Answer Comment   Enteral Substance: Jevity 1.5    Amount Per Bolus: 1.25cartons or    Feeding Route: PEG/Gastrostomy And Linked Group Details      Steroid:  none    Review of systems:  Per RN, no fevers or hypoxia today.     PTA DM Meds:  Non-adherent to prandial insulin    Medications  Scheduled Meds:acetaminophen (TYLENOL) tablet 650 mg, 650 mg, Feeding Tube, Q6H*  amLODIPine (NORVASC) tablet 5 mg, 5 mg, Per NG tube, QDAY  artificial tears (PF) single dose ophthalmic solution 1 drop, 1 drop, Left Eye, TID  aspirin chewable tablet 81 mg, 81 mg, Per NG tube, QDAY  carbamide peroxide (DEBROX) 6.5 % otic solution 5 drop, 5 drop, Both Ears, BID  clopiDOGreL (PLAVIX) tablet 75 mg, 75 mg, Per NG tube, QDAY  diclofenac sodium (VOLTAREN) 1 % topical gel 2 g, 2 g, Topical, QID  Diet Enteral Feeding Bolus, , SEE ADMIN INSTRUCTIONS, QID (3,87,83,79)   And  Water Bolus, 100 mL, PEG Tube, QID (3,87,83,79)  doxazosin (CARDURA) tablet 3 mg, 3 mg, Feeding Tube, QHS  enoxaparin (LOVENOX) syringe 40 mg, 40 mg, Subcutaneous, QDAY(21)  insulin aspart (U-100) (NOVOLOG FLEXPEN U-100 INSULIN) injection PEN 0-12 Units, 0-12 Units, Subcutaneous, QID  insulin aspart (U-100) (NOVOLOG FLEXPEN U-100  INSULIN) injection PEN 16 Units, 16 Units, Subcutaneous, QID  insulin glargine (LANTUS SOLOSTAR U-100 INSULIN) injection PEN 18 Units, 18 Units, Subcutaneous, QDAY  lidocaine (LIDODERM) 5 % topical patch 1 patch, 1 patch, Topical, QDAY  lisinopriL (ZESTRIL) tablet 20 mg, 20 mg, Feeding Tube, QDAY  melatonin tablet 5 mg, 5 mg, Feeding Tube, QHS  metoprolol tartrate tablet 25 mg, 25 mg, Feeding Tube, BID  modafiniL (PROVIGIL) tablet 100 mg, 100 mg, Oral, BID  nystatin (MYCOSTATIN) oral suspension 500,000 Units, 500,000 Units, Swish & Spit, QID  petrolatum (STYE) ophthalmic ointment 0.25 inch, 0.25 inch, Both Eyes, TID  polyethylene glycol 3350 (MIRALAX) packet 17 g, 1 packet, Per G Tube, QDAY  rosuvastatin (CRESTOR) tablet 40 mg, 40 mg, Feeding Tube, QDAY  senna (SENOKOT) tablet 2 tablet, 2 tablet, Per G Tube, QHS  thiamine mononitrate (vit B1) tablet 100 mg, 100 mg, Per NG tube, QDAY  trimethoprim/sulfamethoxazole (BACTRIM) oral suspension 160 mg, 160 mg, Feeding Tube, BID    Continuous Infusions:  PRN and Respiratory Meds:bisacodyL QDAY PRN, dextrose 50% (D50) IV PRN, diclofenac sodium QID PRN, lidocaine hcl PRN, loperamide TID PRN, oxyCODONE Q12H PRN, pancrelipase 20,880 Units/sodium bicarbonate 650 mg (Geiger CLOG DESTROYER) PRN (On Call from Rx), phenoL PRN             Objective                       Vital Signs: Last Filed                 Vital Signs: 24 Hour Range   BP: 114/54 (06/02 0520)  Temp: 36.7 ?C (98 ?F) (06/02 0520)  Pulse: 69 (06/02 0520)  Respirations: 18 PER MINUTE (06/02 0520)  SpO2: 94 % (06/02 0520)  O2 Device: None (Room air) (06/01 1600) BP: (114-125)/(51-70)   Temp:  [36.4 ?C (97.5 ?F)-36.9 ?C (98.4 ?F)]   Pulse:  [69-78]   Respirations:  [16 PER MINUTE-18 PER MINUTE]   SpO2:  [94 %-96 %]   O2 Device: None (Room air)     Vitals:    10/30/23 1511 11/04/23 0401 11/11/23 9361   Weight: 59.4 kg (131 lb) 63.7 kg (140 lb 6.9 oz) 66 kg (145 lb 8.1 oz)         Physical Exam  Gen-resting; not interactive with verbal stimuli  Head-at/nc  EENT-eyes mainly closed in my visit  CV-RRR   Pulm-on room air; no distress  Abd-g tube s/ erythema       Lab Review  Comprehensive Metabolic Profile    Lab Results   Component Value Date/Time    NA 133 (L) 11/11/2023 06:18 AM    K 4.9 11/11/2023 06:18 AM    CL 96 (L) 11/11/2023 06:18 AM    CO2 27 11/11/2023 06:18 AM    GAP 10 11/11/2023 06:18 AM    BUN 25 11/11/2023 06:18 AM    CR 0.97 11/11/2023 06:18 AM    GLU 132 (H) 11/11/2023 06:18 AM    Lab Results   Component Value Date/Time    CA 9.1 11/11/2023 06:18 AM    ALBUMIN 4.0 10/22/2023 07:01 PM    TOTPROT 7.3 10/22/2023 07:01 PM    ALKPHOS 71 10/22/2023 07:01 PM    AST 19 10/22/2023 07:01 PM    ALT 21 10/22/2023 07:01 PM    TOTBILI 0.8 10/22/2023 07:01 PM    GFR >60 11/11/2023 06:18 AM    GFRAA >60 02/22/2019 06:28 PM  Lab Results   Component Value Date    CHOL 216 (H) 10/17/2023    TRIG 219 (H) 10/17/2023    HDL 36 (L) 10/17/2023    LDL 161 (H) 10/17/2023    VLDL 43.8 10/17/2023    NONHDLCHOL 180 10/17/2023      TSH   Date Value Ref Range Status   06/05/2023 0.40 0.35 - 5.00 ?IU/mL Final     Microalbumin/CR ratio Urine   Date Value Ref Range Status   05/06/2017 285.85 (H) <30 ug/mg Final     Comment:     NOTE NEW REFERENCE RANGES           Glucose testing    Recent Labs     11/09/23  1637 11/09/23  2133 11/10/23  0405 11/10/23  0537 11/10/23  1252 11/10/23  1645 11/10/23  2043 11/11/23  0638   GLUPOC 232* 266* 158* 143* 174* 323* 282* 137*       Hemoglobin A1C   Date Value Ref Range Status   10/17/2023 11.7 (H) 4.0 - 5.7 % Final     Comment:     The ADA recommends that most patients with type 1 and type 2 diabetes maintain an A1c level <7%.           Radiology and other Diagnostics Review:      EXAM: MRI AND MRA BRAIN    HISTORY: Worsening exam post stroke (Mild VI weakness --> complete  bilateral gaze palsy --> now left facial palsy)- Expansion of small vessel  disease vs inflammatory condition? Vasculitis?.    TECHNIQUE: Multiplanar and multisequence MR imaging of the head was  performed. This was done both before and after the administration of  MultiHance contrast. 3D time-of-flight  images of the circle of Willis was  performed without contrast. Vasculitis protocol was performed.    COMPARISON: CT head 10/26/2023. Brain MRI 10/18/2023. CTA head-neck 10/17/2023.    FINDINGS:    MR brain:    Several sequences are degraded and limited by patient motion.    Persistent small rounded areas of diffusion hyperintensity-restriction in  the bilateral pontomedullary junction (series 5, image 35) with increased  conspicuity of diffusion hyperintensity-restriction in the posterior  pontomedullary junction (series 7, image 10). Small ovoid focus of  superimposed rim enhancement posteriorly and faint areas of bilateral  pontomedullary incomplete rim enhancement (series 25, images 56 and 53)    Tiny focus of enhancement along the right lateral precentral gyrus (series  25, image 134) without corresponding signal abnormality on the additional  sequences. Additional punctate focus of right posterior cerebral white  matter enhancement associated with an old lacunar-type infarct (series 25,  image 110), presumably related to blood brain barrier breakdown.    Stable mild generalized cerebral and cerebellar volume loss with  commensurate prominence of the ventricles and subarachnoid spaces. Stable  moderate patchy and confluent cerebral white matter FLAIR hyperintensities  with multiple scattered superimposed chronic left basal ganglia-capsular,  left anteromedial thalamic, and right posterior cerebral white matter  lacunar type infarcts.    No midline shift, intracranial herniation, or hydrocephalus. Loss of the  proximal right intracranial vertebral arterial flow void.    MRA head:    Multifocal luminal irregularity of the bilateral carotid siphons,  resulting in severe luminal stenosis of the right paraclinoid internal  carotid artery and less pronounced mild luminal narrowing of the left  paraclinoid internal carotid artery.    Stable multifocal luminal irregularity and narrowing of the bilateral  anterior, middle, and posterior cerebral arteries, most pronounced with  severe luminal stenoses of the right middistal M1 segment, right MCA  bifurcation, and bilateral P2 segments. Fetal-type left PCA.    There is absent flow related signal and enhancement in the proximal right  intracranial V4 segment (apparent filling defect on series 20, image 34)  with diminutive caliber of the right distal intracranial vertebral artery.  The left intracranial vertebral artery demonstrates moderate focal  atherosclerotic luminal stenosis (series 20, image 36) though is otherwise  patent. The mid basilar artery demonstrates mild to moderate  atherosclerotic luminal narrowing (series 14, image 72).    No aneurysm or arteriovenous malformation is identified. No vessel wall  enhancement to suggest active arterial wall inflammation.    IMPRESSION      MRI:  1.  Redemonstration of evolving acute-subacute pontomedullary infarcts,  with increased conspicuity of ischemia in the posterior brain stem since  the comparison examination.  2.  Punctate focus of right lateral precentral gyral cortical enhancement  without corresponding signal abnormality on additional sequences,  potentially reflecting incidental vascular enhancement. Tiny subacute  enhancing infarct could appear similar.  3.  Stable mild generalized cerebral and cerebellar volume loss, moderate  chronic microvascular ischemic cerebral white matter changes, and multiple  superimposed scattered superimposed chronic infarcts.    MRA:  1.  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  2.  Multifocal presumably atherosclerotic severe intracranial arterial  luminal stenoses, including the right paraclinoid internal carotid artery,  right middle cerebral, and bilateral posterior cerebral arteries. No  vessel wall edema or enhancement to suggest active arterial wall  inflammation.       Finalized by Rockey Gin, DO on 10/28/2023 8:18 AM. Dictated by Rockey Gin, DO on 10/28/2023 7:51 AM.       Patient discussed with Dr. Karnchanasorn today in handoff.    Anette CHRISTELLA Caldron, MD   [phone]

## 2023-11-11 NOTE — Progress Notes
 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  male admitted to [facility] on 10/28/2023 with the following issues: stroke      Rehabilitation Plan  Tentative discharge date: 11/19/2023  Rehabilitation: Patient will continue with comprehensive therapies at modified therapy schedule including physical therapy, occupational therapy, speech & language pathology, specialized rehab nursing, neuropsychology and physiatry oversight.    Goals:  Patient Will Perform Basic Cares With: Partial/moderate assistance, Progressing  Patient Will Perform Household Mobility With: Partial/moderate assistance, At wheelchair level  Recommended therapy after discharge: Consistent assistance, Home setting with home health, Versus, Skilled nursing facility  PT recommended equipment: Too early to determine (wheelchair Web designer))  OT recommended equipment: Tub transfer bench    Daily Functional Update:  Stand to Sit Assist Level: Partial/moderate assistance (11/09/2023 10:15 AM)  Equipment: Hand hold assist (11/09/2023 10:15 AM)     Stand Pivot Assist Level: Partial/moderate assistance (11/09/2023 10:15 AM)  Equipment: Hand hold assist (11/09/2023 10:15 AM)     Slideboard No data recorded  No data recorded   Gait Distance: 25 (11/07/2023 10:10 AM)    Assist Level: Dependent (11/05/2023  2:45 PM)    Equipment: Body weight supported; Hand hold assist (11/07/2023 10:10 AM)     Wheelchair Distance 1: From room to gym (11/07/2023 10:10 AM)    Assist Level: Substantial/maximal assistance (11/07/2023 10:10 AM)     Toileting       Assist Level: Dependent (11/09/2023 10:15 AM)  Equipment: -- Jayson Steady) (11/07/2023 10:10 AM)     Toilet Transfer No data recorded   Upper Body Dressing Assist Level: Substantial/maximal assistance (10/29/2023  1:00 PM)     Lower Body Dressing Assist Level: Substantial/maximal assistance (11/08/2023  1:00 PM)       Diet: PO: Ice chips only (11/07/2023  1:30 PM)      Orientation Log & Cognitive Log Scores for the past 72 hrs (Last 3 readings):   Orientation Log Score (Out of 30)   11/08/23 1510 12/30             Current Medical Problems/Risks of Medical Complications/Management    Acute Right Pontomedullary Ischemic Stroke  Proximal Right Intracranial Vertebral Artery Occlusion  Diffuse Intracranial Atherosclerosis  Bilateral Horizontal Gaze Palsy  History of Forced Left Gaze Deviation  Left Facial Palsy  Impairments: left hemiparesis, expressive aphasia, oropharyngeal dysphagia, right facial weakness, left facial droop with dysarthria, diplopia, concern for attention/cognitive impairments, LUE ataxia, neurogenic bowel, neurogenic bladder  Impaired mobility and ADL, impaired swallow and cognition/communication               > consult PT/OT/SLP and neuropsychology to evaluate and treat               > Continue Modafinil 100mg  BID (0600, 1200) - continue to monitor response   > Zoloft 50mg  daily - discontinued 5/29, restarting 6/2 per Psychiatry recommendations as pt continues to have SI despite recent discontinuation   > Videoswallow study 5/29- silently aspirated thin liquids and penetrated mildly thick liquids, too much oral weakness/uncoordination to consider dysphagia diet safely at this point; continue NPO at this time, continue to encourage ice chips throughout the day under supervision               > Sleep optimization: melatonin 5mg  qhs, previously on seroquel nightly               > follow up: Neurology, PM&R, Cardiology     Secondary Stroke Risk Optimization  > Antiplatelet/Anticoagulation: DAPT with aspirin 81mg  daily and clopidogrel 75mg  daily for 90 days followed by aspirin 81mg  indefinitely  > Blood Pressure Goal: <160 during acute hospitalization, long term goal of < 130/80               - Current antihypertensive medications: amlodipine 5mg  daily, lisinopril 20mg  daily, metoprolol 25mg  BID  > Lipids: LDL is 161, with goal of <70: rosuvastatin 40mg  daily (per Cardiology recs)  > Blood Glucose: Hgb A1c is 11.7%, with goal of <7  > Tobacco abstinence goal     Bladder Management  Neurogenic Bladder with Retention       Urinary tract infection  -Post-Void (PVR) Bladder Scan (mL): 250 milliliters (11/09/2023  6:35 PM)  -Straight Cath (mL): 550 (11/11/2023  5:19 AM)  - One episode of self-void 5/28; doxazosin increased to 3mg   > Monitor urinary retention per unit protocol. If unable to void or PVRs >300cc perform clean ISC (intermittent straight catheterization) for volumes > . Stop checking BVIs if 3 consecutive volumes <150cc.   > Discuss foley placement vs ISC early this week following treatment of UTI - bactrim 160mg  BID 5/31-6/3 (urine cx +e. Coli, sensitive to bactrim)  > Doxazosin 3mg  QHS (last increase 5/28)    Suicidal Ideation  Pseudobulbar Affect  Depression  - family denies knowledge of previous or current SI   > neuropsychology and psychiatry following   > Per Neurology: Consider starting Dextromethorphan-Quinidine in the outpatient setting (unfortunately unavailble on the formulary inpatient) for Pseudobulbar affect, will start Fluoxetine 20mg  in the meantime --> instead was started on Sertraline 50mg , will continue while in rehab  > Neurology clinic follow up on dc  > Per neuropsychology eval on 5/28: Pt?s reported mood was sad. Pt endorsed sadness and passive SI and also made a gesture of shooting himself. He reported that the last time he thought about it was yesterday. Will place psychiatry consult for SI and CO for safety. Neuropsychology plans to re-eval tomorrow.   > Psychiatry: continue CO, restart zoloft 6/2, continue to follow.      Dysphagia s/p PEG tube placement  Moderate Malnutrition  - PEG tube placement 5/15               >  Dietician consult, appreciate tube feed recommendations- transition from continuous to bolus feeds 5/19. Appreciate most updated nutrition recommendations, 5/27:  > ADAT to least restrictive per SLP; currently strict NPO with ice chips only when brightly alert   > EN Recs: Bolus Feeds of Jevity 1.5 x5 cartons daily; administered as 1 + 1/4 cartons ( ) QID  Provides 1778kcal, 76g protein, 901mL free fluids  Recommend at minimum water flushes of 30mL before and after each bolus feed for tube patency.   Additional fluids per primary team, with consideration for water boluses of 100mL before and after bolus feeds (provides additional 800mL daily for total fluids of 174ml/d)  > Per videoswallow study 5/29- silently aspirated thin liquids and penetrated mildly thick liquids, too much oral weakness/uncoordination to consider dysphagia diet safely at this point; continue NPO at this time, continue to encourage ice chips throughout the day under supervision    Left Bell's Palsy  Discharge of the left eye  Left TMJ pain - Resolved  Ear Wax B/L ear - Improved               > Alternate eye patches; eyepatch for the L eye overnight, artificial tears TID daily in the setting of left facial palsy- increase drops to the left eye in the setting of L eye discharge. Monitor for ssx of infection to the left eye and consider erythromycin ointment if infection suspected   > lidocaine patch and tylenol to be continued as pt reports these improve left sided jaw pain   > Debrox added - discontinue 6/2    Oral Thrush               > oral care TID   > Nystatin swish and spit started 5/20, ok'd by SLP     Hyponatremia  Hyperkalemia- Resolved  - K 5.2 5/28 AM  - K 4.3 5/29 AM  - s/p lokelma x1 5/28   > monitor intermittent BMP    Daytime Sleepiness - Improved  Fatigue - Improved  Increased Somnolence - Resolved  - Concern for increased somnolence 5/20 leading to Neurology Consult- rec CTA H&N; discussed with team may attempt to liberalize BP I.e. decreasing amlodipine to 5mg  due to symptoms possibly 2/2 to hypoperfusion  - CTA Head and Neck - negative for acute change  - Infectious workup including UA, CXR- negative for acute concerns  > appreciate further neurology recommendations- if acute focal weakness, numbness, coma, etc. Stroke activate/send to ED as he is high risk to have progression of his stroke/basilar involvement. Signed off.   > monitor on modafinil BID    Bowel Management  At risk for Neurogenic Bowel  Loose stools (resolved)  Constipation  -Last Bowel Movement Date: 11/10/23 (11/11/2023  5:07 AM)  - Goal for a daily BM or every 24-48 hours.   - c diff negative 5/18  > adjust bowel regimen PRN   > loperamide PRN  > Senna 2 tabs QHS via g tube  > MiraLax daily via g tube     Hypertension  Orthostatic Hypotension  LVOT Obstruction  PFO  CAD s/p PCI               > metoprolol 25mg  BID               > amlodipine 5mg  daily, lisinopril 20mg  daily               > 30 day MCOT    > compression stockings to prevent  sx orthostatic hypotension               > Follow up with outpatient Cardiology      Diabetes Mellitus               > Endocrinology consult for ongoing discharge regimen planning    Aspiration Pneumonia - Resolved  S/p AHRF  - increased O2 req overnight 5/13 with elevated procal, tachycardia, leukocytosis  - CXR 5/13 notable for mild basal opacities, favored to represent atelectasis  - s/p vanc x1 and zosyn x3 days  - sputum cx NGTD  - Augmentin to complete total 7 day course of antibiotics through 5/20 AM    Nutrition:   Body mass index is 24.21 kg/m?SABRA  Malnutrition Details:  Malnutrition present on admission  ICD-10 code E44: Chronic illness/Moderate non-severe malnutrition      Mild loss of muscle mass, Mild loss of body fat, Energy intake: Less than 75% of estimated energy requirement for 1 month or more        Loss of Subcutaneous Fat: Yes Mild Orbital, Triceps  Muscle Wasting: Yes Moderate Clavicle, Temple, Deltoid               Malnutrition Interventions: EN recs to meet 100% estimated kcal and protein needs    Wounds:   Wounds Moisture associated skin damage Posterior Gluteal Cleft (Active)   Initial Assess Date/Initial Assess Time: 11/08/23 0900   Wound Type: Moisture associated skin damage  Orientation: Posterior  Location: Gluteal Cleft      Assessments 11/08/2023  8:00 PM 11/10/2023  8:00 PM   Wound Assessment Moist;Pink;Red;Non-blanchable Pink   Peri-wound Assessment Intact Intact   Wound Drainage Amount None None   Wound Dressing Status None/open to air None/open to air   Wound Care Treatment or ointment applied --   Wound Dressing and/or Treatment A & D ointment --       No associated orders.       DVT ppx: enoxaparin (LOVENOX) syringe 40 mg  QDAY(21); patient will not require at the time of discharge    Code Status: Full Code    Current Medications:  Scheduled Meds:acetaminophen (TYLENOL) tablet 650 mg, 650 mg, Feeding Tube, Q6H*  amLODIPine (NORVASC) tablet 5 mg, 5 mg, Per NG tube, QDAY  artificial tears (PF) single dose ophthalmic solution 1 drop, 1 drop, Left Eye, TID  aspirin chewable tablet 81 mg, 81 mg, Per NG tube, QDAY  carbamide peroxide (DEBROX) 6.5 % otic solution 5 drop, 5 drop, Both Ears, BID  clopiDOGreL (PLAVIX) tablet 75 mg, 75 mg, Per NG tube, QDAY  diclofenac sodium (VOLTAREN) 1 % topical gel 2 g, 2 g, Topical, QID  Diet Enteral Feeding Bolus, , SEE ADMIN INSTRUCTIONS, QID (3,87,83,79)   And  Water Bolus, 100 mL, PEG Tube, QID (3,87,83,79)  doxazosin (CARDURA) tablet 3 mg, 3 mg, Feeding Tube, QHS  enoxaparin (LOVENOX) syringe 40 mg, 40 mg, Subcutaneous, QDAY(21)  insulin aspart (U-100) (NOVOLOG FLEXPEN U-100 INSULIN) injection PEN 0-12 Units, 0-12 Units, Subcutaneous, QID  insulin aspart (U-100) (NOVOLOG FLEXPEN U-100 INSULIN) injection PEN 16 Units, 16 Units, Subcutaneous, QID  insulin glargine (LANTUS SOLOSTAR U-100 INSULIN) injection PEN 18 Units, 18 Units, Subcutaneous, QDAY  lidocaine (LIDODERM) 5 % topical patch 1 patch, 1 patch, Topical, QDAY  lisinopriL (ZESTRIL) tablet 20 mg, 20 mg, Feeding Tube, QDAY  melatonin tablet 5 mg, 5 mg, Feeding Tube, QHS  metoprolol tartrate tablet 25 mg, 25 mg, Feeding Tube, BID  modafiniL (PROVIGIL)  tablet 100 mg, 100 mg, Oral, BID  nystatin (MYCOSTATIN) oral suspension 500,000 Units, 500,000 Units, Swish & Spit, QID  petrolatum (STYE) ophthalmic ointment 0.25 inch, 0.25 inch, Both Eyes, TID  polyethylene glycol 3350 (MIRALAX) packet 17 g, 1 packet, Per G Tube, QDAY  rosuvastatin (CRESTOR) tablet 40 mg, 40 mg, Feeding Tube, QDAY  senna (SENOKOT) tablet 2 tablet, 2 tablet, Per G Tube, QHS  thiamine mononitrate (vit B1) tablet 100 mg, 100 mg, Per NG tube, QDAY  trimethoprim/sulfamethoxazole (BACTRIM) oral suspension 160 mg, 160 mg, Feeding Tube, BID    Continuous Infusions:  PRN and Respiratory Meds:bisacodyL QDAY PRN, dextrose 50% (D50) IV PRN, diclofenac sodium QID PRN, lidocaine hcl PRN, loperamide TID PRN, oxyCODONE Q12H PRN, pancrelipase 20,880 Units/sodium bicarbonate 650 mg (R[place]ely CLOG DESTROYER) PRN (On Call from Rx), phenoL PRN      Subjective     No acute events overnight. Pt seen at bedside, lying in bed comfortably listening to music on his phone. He denies pain today. Discussed reported minimal stringy discharge of bilateral eyes, increasing number of drops of artificial tears to both eyes. Denies acute concerns this AM. Last bowel movement 6/1.     Objective                          Vital Signs: Last Filed                 Vital Signs: 24 Hour Range   BP: 114/54 (06/02 0520)  Temp: 36.7 ?C (98 ?F) (06/02 0520)  Pulse: 69 (06/02 0520)  Respirations: 18 PER MINUTE (06/02 0520)  SpO2: 94 % (06/02 0520)  O2 Device: None (Room air) (06/01 1600) BP: (114-125)/(51-70)   Temp:  [36.4 ?C (97.5 ?F)-36.9 ?C (98.4 ?F)]   Pulse:  [69-78]   Respirations:  [16 PER MINUTE-18 PER MINUTE]   SpO2:  [94 %-96 %]   O2 Device: None (Room air)     Vitals:    10/30/23 1511 11/04/23 0401 11/11/23 0638   Weight: 59.4 kg (131 lb) 63.7 kg (140 lb 6.9 oz) 66 kg (145 lb 8.1 oz)       Intake/Output Summary:  (Last 24 hours)    Intake/Output Summary (Last 24 hours) at 11/11/2023 0754  Last data filed at 11/11/2023 0519  Gross per 24 hour   Intake 2656 ml   Output 925 ml   Net 1731 ml        Last Bowel Movement Date: 11/10/23    Labs--reviewed  Results for orders placed or performed during the hospital encounter of 10/28/23 (from the past 24 hours)   POC GLUCOSE    Collection Time: 11/10/23 12:52 PM   # # Low-High    Glucose, POC 174 (H) 70 - 100 mg/dL   POC GLUCOSE    Collection Time: 11/10/23  4:45 PM   # # Low-High    Glucose, POC 323 (H) 70 - 100 mg/dL   POC GLUCOSE    Collection Time: 11/10/23  8:43 PM   # # Low-High    Glucose, POC 282 (H) 70 - 100 mg/dL   CBC CELLULAR THERAPEUTICS    Collection Time: 11/11/23  6:18 AM   # # Low-High    White Blood Cells 8.80 4.50 - 11.00 10*3/uL    Red Blood Cells 3.59 (L) 4.40 - 5.50 10*6/uL    Hemoglobin 11.0 (L) 13.5 - 16.5 g/dL    Hematocrit 69.4 (L) 40.0 - 50.0 %  MCV 85.1 80.0 - 100.0 fL    MCH 30.7 26.0 - 34.0 pg    MCHC 36.1 (H) 32.0 - 36.0 g/dL    RDW 87.3 88.9 - 84.9 %    Platelet Count 237 150 - 400 10*3/uL    MPV 9.7 7.0 - 11.0 fL   POC GLUCOSE    Collection Time: 11/11/23  6:38 AM   # # Low-High    Glucose, POC 137 (H) 70 - 100 mg/dL         Physical Exam  General: No acute distress lying in bed comfortably, interactive  HEENT: oral mucosa moist, left eye with minimal tan-colored thin discharge, no green or thick discharge from either eye  Heart: Extremities warm and well perfused  Lungs: Normal work of breathing on room air  Abdomen: Soft, non distended, no tenderness to palpation, +G tube without erythema or drainage  Extremities: Peripheral edema is not present  Psych: mood and affect seemingly congruent  Neuro: At least antigravity shoulder abduction bilaterally, at least antigravity hip flexion and knee extension. Hypophonic and dysarthric, mostly nods/shakes head for communication. Left facial paralysis, bilateral horizontal gaze palsy appreciated.     Labs & Therapy Notes Reviewed    Corean CHRISTELLA Brewster, DO   Physical Medicine & Rehabilitation  For patient care questions contact Rehab Medicine 3 FIRST CALL via Voalte.

## 2023-11-11 NOTE — Progress Notes
 SPEECH-LANGUAGE PATHOLOGY       11/11/23 1345   Behavior   Comments* Pt seated in wheelchair in room with daughter and CO present. Pt alert and cooperative. Pt returned to room and remained seated in wheelchair. Red flag handoff completed with CO following session. Pt reported during session, I want to get better for my daughter. I want to live for my daughter.   Dysphagia Goals   Therapy Activities PO trials;Swallow exercises   Swallow exercises Effortful swallow;Expiratory muscle strength trainer (EMST);Lingual isometric exercises ([place]  oral performance instrument-IPOI)   PO Trials Mildly thick liquids;Puree   Therapy Activity Comments Pt provided with PO trials mildly thick liquids via cup and pureed solids. Attempted to provide PO trials of thin liquids and additional mildly/moderately thick liquids however pt declining despite education and multiple attempts. Left sided anterior loss noted x3 (x1 instance via controlled volumes via medicine cup, x2 via tsp pureed solids) however pt continues to demonstrate adequate sensory awareness. PO trials mildly thick liquids provided via 5cc and 10cc in medicine cup to facilitate bolus acceptance/withdrawal and improve coordination. Multiple swallows appreciated across all PO trials in addition to mild oral residue following initial swallow. Pt required intermittent cues for dry/secondary swallow. Delayed cough x1 noted following completion of PO trials. Pt has hx of silent aspiration per recent VSS. Ongoing education provided to pt/daughter in room re: upkeep with oral cares and ice chip protocol outside of therapy sessions.    Attempted effortful swallow with ice chips however pt declining despite ongoing education. Attempted to complete with pureed solid trials, however pt demonstrated increased difficulty comprehending instructions despite SLP model/demonstration.    Attempted IOPI exercises as previously initiated in prior sessions, however pt demonstrated significantly increased difficulty following instructions despite max cues and repeat of education/instruction. Task discontinued 2/2 reduced accuracy and overall understanding.    Continued with EMST Lite at lowest setting. Pt demonstrated moderate difficulty with adequate labial seal around device primarily to left side likely related to recent dx of Bell's Palsy. SLP provided tactile cues in attempt to improve labial seal which inconsistently was successful. Pt completed 3x5. Increased need for instruction required between each set.   Patient Will Tolerate PO Trials to Advance Diet with Moderate prompting - patient able to 25-49% of the time;Progressing   Orientation Goals   Therapy Activities Orientation log score   Orientation Log Score (Out of 30) 12/30   Therapy Activity Comments Same scored achieved as previous date. Pt independent to city and name of place but otherwise required cueing hierarchy (primarily choice options) for all other concepts. Cueing ineffective for date and year this date. Pt reported, I can't think of the answer and unclear of process/recall deficits vs potential word finding difficulty. Will continue to monitor and consider further language assessment as clinically indicated.   Patient Will Perform on the Orientation Log with Standby prompting - patient able to > 90% of the time, prompting < 10%;Not progressing;Moderate prompting - patient able to 25-49% of the time;Progressing   Memory Goals   Therapy Activities Immediate recall   Therapy Activity Comments Attempted immediate recall via CT App: Follow Instructions You Hear. Significant difficulty noted with understanding instructions + vision deficits likely impacting. Task abandoned as pt additionally appeared to potentially hallucinating and grabbing/reaching past iPad screen.   Patient Will Use a Strategy to Recall Verbally Presented Information with Moderate prompting - patient able to 25-49% of the time;Progressing   Attention Goals   Patient Will Complete Sustained Attention  Task with Moderate prompting - patient able to 25-49% of the time;Progressing   Problem Solving Goals   Patient Will Perform Financial Management Tasks with Moderate prompting - patient able to 25-49% of the time;Progressing   Patient Will Demonstrate Adequate Problem Solving for Medication Management with Moderate prompting - patient able to 25-49% of the time;Progressing         Assessment O-Log continues to fall below normal limits and overall scores trending downwards. Increased confusion noted during session today.   Plan Cognition:  -- O-log  -- Simple attention/memory  -- Visual scanning  -- Medication management  -- Finance management     Dysphagia:  -- PO trials thin/mildly thick liquids, pureed solids  -- EMST-Lite  -- Swallow exercises as able (effortful swallow, masako, lingual protrusion/holds)   -- Continue IOPI as able     Miriam Dills, MA CCC-SLP

## 2023-11-11 NOTE — Progress Notes
 PHYSICAL THERAPY  NOTE      Name: Kevin Obrien   MRN: [id number]     DOB: [birth year]      Age: 73 y.o.  Admission Date: 10/28/2023     LOS: 14 days     Date of Service: 11/11/2023         11/11/23 1100   Type of Note   Type of Note Daily   Time Calculation   Start Time 1100   Stop Time 1200   Time Calculation 60   $$ Gait / Mobility 2 units    $$ PT Therapeutic Activity 2 units   History   Reason For Admission The patient presented to [facility] on 10/17/2023 with left gaze deviation, facial weakness and dysarthria.  CT head was negative for acute changes.  CTA head and neck without LVO.  Patient was not a TNK or EVT candidate. 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 Neurology recommend DAPT with aspirin and Plavix.  He is currently on permissive hypertension.   Previous Medical History 73 y.o. male and  has a past medical history of Accidental fall, Angina pectoris, Arthritis, BPH (benign prostatic hyperplasia), Cataract, Constipation, Coronary artery disease, DM (diabetes mellitus) (CMS-HCC), ED (erectile dysfunction), Heart attack (CMS-HCC), Hyperlipidemia, Hypertension, Joint pain, Neuropathy, and Shingles.   Subjective   Subjective Patient in bed at onset of session.  Agreeable to therapy.   Precautions   Comments L sided weakness, visual impairment (horizontal gaze palsy), PEG   Lying to Sitting on Side of Bed   Patient Needs Assistance With Trunk;Extra time to complete the activity   Assist Level Partial/moderate assistance   Equipment No rails;HOB elevated   Comments minimal assist   Sit to Lying   Patient Needs Assistance With Left lower extremity   Assist Level Partial/moderate assistance   Equipment No rails;HOB flat   Sit to Stand   Patient Needs Assistance With Extra time to complete activity;Facilitation of hip extension;Facilitation of trunk;Facilitation of weight shift;Balance   Assist Level Partial/moderate assistance   Equipment   (UE support on PT arms)   Stand to Sit Patient Needs Assistance With Facilitation of weight shift;Balance;Cues for sequencing;Extra time to complete activity;Financial planner;Facilitation of hip flexion;Facilitation of trunk;Cues for positioning of hands   Assist Level Partial/moderate assistance   Equipment   (UE support on PT arms)   Stand Pivot Transfer   Patient Needs Assistance With Extra time to complete activity;Facilitation of hip extension;Facilitation of trunk;Facilitation of weight shift;Balance   Assist Level Partial/moderate assistance   Equipment   (UE support on PT arms)   Daily Care   Urine Function Straight cath   Straight Cath (mL) 600   Urine Color Yellow   Urine Description Clear   Urinary Catheter / Perineal Care Assist   Gait Distance 1   Distance 30, 50 x 2   Patient Needs Assistance With Assist of two;Extra time to complete the activity;Facilitation of weight shift   Assist Level Dependent   Equipment   (UE support on PT arms)   Patterns Decreased gait velocity;Narrow base of support;Shuffling;Trunk lean forward   Deviation Left Decreased hip extension;Decreased knee flexion in swing;Decreased stride length;No heel strike/foot flat;Left scissoring   Deviation Right Decreased hip extension;Decreased knee flexion in swing;Decreased stride length;No heel strike/foot flat;Right scissoring   Comments moderate assist with gait until patient fatiguing and then B knees flexing and difficulty initiating step   Stairs   Stairs # of Stairs Climbed (Set 1)   #  of Stairs Climbed (Set 1)   # of Stairs Climbed 4 break 4   Patient Needs Assistance With Facilitation of weight shift;Balance;Assist of two   Assist Level Dependent  (minimal to moderate assist of 2)   Equipment Two rails;Assist of two   Technique Variable   Comments Patient increase assist final step of first 4.  Having difficulty completing full step off final step and allowing both knees to flex.  applied new tape to edge of steps prior to 2nd attempt.  No difficulty noted with final step   Therapeutic Interventions   Therapeutic Activities side stepping at elevated mat x15 ft x 3 assist of 2 for safety,  Patient reported dizziness.  Unable to obtain BP in standing but sitting measurement right after stand consistent with sitting BP prior to stand   Assessment   Assessment Patient performing well throughout session.  When fatigued patient's knee both begining to flex.  Assessed BP but unable to attain standing BP.  Did not appear to be orthostatic.   Weekly PT Progress improved partiicpation this date.  Decreased assist for transfers and gait   Weekly PT Barriers/Concerns Continues to be 2 person assist for gait due to variable distance,  High burden of care, vision deficits impacting depth perception and moving around objects   Plan   PT Plan gait training (hand hold assist, vector to help with posture vs rifton), neurogym for higher reps (consider using mirror for visual feedback), standing/reaching activities to promote upright posture, trial stairs as appropriate, nustep for conditioning, outcome measures as appropriate, continued wc ultralight propulsion; standing at plinth   Recommendations   PT Discharge Recommendations Consistent assistance;Home setting with home health;Versus;Skilled nursing facility   PT Equipment Recommendations   (light weight versus custom wheelchair)         Therapist: Truman Caper  Date: 11/11/2023

## 2023-11-11 NOTE — Progress Notes
 Psychiatric Consultation Progress Note    LOS: 14 days    Psychiatric Assessment:  Adjustment disorder with mixed anxiety and depressed mood     Relevant Medical Problems:  Acute ischemic stroke, right pontomedullary  Expressive aphasia    Recommendations:  Re-initiate sertraline 50 mg Q daily for anxiety and depression. Suicidal ideations have persisted despite discontinue sertraline last week. SI is likely related to adjustment around new stroke symptoms and resulting lifestyle change (ie requiring rehab, loss of independence, etc).  Continue melatonin 5 mg PO QHS for insomnia and circadian whythm regulation.  Patient endorses SI with plan but is unable to articulate plan in the setting of expressive aphasia. Recommend continuing CO.   Neurorehabilitation Psychology consulted. Agree with and appreciate recommendations.  Psychiatry will continue to follow.     Reviewed EMR.  Reviewed Laboratory studies. QTc 498 on 5/18    [name] Baseman, DNP, APRN, PMHNP-BC, PMGT-BC  Nurse Practitioner, Psychiatry  Available via [facility]    Please feel free to contact us  with any additional questions or concerns by paging the consult team between 8am and 5pm on weekdays and between 8am and 3pm on weekends at [phone]. Otherwise, page the psychiatry resident on call.    --------------------------------------------------------------------------------------------------------------------------------  Subjective:  Kevin Obrien is a 73 year-old male seen at the bedside for follow-up evaluation. Patient is alert and awake, resting in bed with CO and his daughter at the bedside. His daughter stepped out of the room to give patient privacy to speak with this provider. He endorses that his mood is horrible. Patient verbalizes that he is experiencing depression and hopelessness. He worries that he is not going to get better. Patient reports that he feels as though every day is the same. He had difficulty sleeping overnight. Patient is tolerating his enteral bolus tube feedings. He endorses ongoing suicidal ideation with plan. Patient was unable to articulate his plan likely due to his expressive aphasia. He denies HI/AVH.     Review of Systems   Respiratory:  Positive for cough. Negative for shortness of breath.    Cardiovascular:  Negative for chest pain.   Gastrointestinal:  Negative for nausea and vomiting.   Neurological:  Negative for dizziness and headaches.   Psychiatric/Behavioral:  Positive for depression and suicidal ideas. Negative for hallucinations. The patient is nervous/anxious and has insomnia.         Objective:                   Vital Signs:  Current                Vital Signs: 24 Hour Range   BP: 137/66 (06/02 1603)  Temp: 36.9 ?C (98.5 ?F) (06/02 1603)  Pulse: 69 (06/02 1603)  Respirations: 16 PER MINUTE (06/02 1603)  SpO2: 98 % (06/02 1603)  O2 Device: None (Room air) (06/02 1603) BP: (114-152)/(51-66)   Temp:  [36.4 ?C (97.5 ?F)-36.9 ?C (98.5 ?F)]   Pulse:  [69-75]   Respirations:  [16 PER MINUTE-18 PER MINUTE]   SpO2:  [94 %-98 %]   O2 Device: None (Room air)    Intensity Pain Scale (Self Report): (not recorded)      Scheduled Medications:  acetaminophen (TYLENOL) tablet 650 mg, 650 mg, Feeding Tube, Q6H*  amLODIPine (NORVASC) tablet 5 mg, 5 mg, Per NG tube, QDAY  artificial tears (PF) single dose ophthalmic solution 2 drop, 2 drop, Both Eyes, TID  aspirin chewable tablet 81 mg, 81 mg, Per NG tube,  QDAY  clopiDOGreL (PLAVIX) tablet 75 mg, 75 mg, Per NG tube, QDAY  diclofenac sodium (VOLTAREN) 1 % topical gel 2 g, 2 g, Topical, QID  Diet Enteral Feeding Bolus, , SEE ADMIN INSTRUCTIONS, QID (3,87,83,79)   And  Water Bolus, 100 mL, PEG Tube, QID (3,87,83,79)  doxazosin (CARDURA) tablet 3 mg, 3 mg, Feeding Tube, QHS  enoxaparin (LOVENOX) syringe 40 mg, 40 mg, Subcutaneous, QDAY(21)  insulin aspart (U-100) (NOVOLOG FLEXPEN U-100 INSULIN) injection PEN 0-12 Units, 0-12 Units, Subcutaneous, QID  insulin aspart (U-100) (NOVOLOG FLEXPEN U-100 INSULIN) injection PEN 16 Units, 16 Units, Subcutaneous, QID  insulin glargine (LANTUS SOLOSTAR U-100 INSULIN) injection PEN 18 Units, 18 Units, Subcutaneous, QDAY  lidocaine (LIDODERM) 5 % topical patch 1 patch, 1 patch, Topical, QDAY  lisinopriL (ZESTRIL) tablet 20 mg, 20 mg, Feeding Tube, QDAY  melatonin tablet 5 mg, 5 mg, Feeding Tube, QHS  metoprolol tartrate tablet 25 mg, 25 mg, Feeding Tube, BID  modafiniL (PROVIGIL) tablet 100 mg, 100 mg, Oral, BID  nystatin (MYCOSTATIN) oral suspension 500,000 Units, 500,000 Units, Swish & Spit, QID  petrolatum (STYE) ophthalmic ointment 0.25 inch, 0.25 inch, Both Eyes, TID  polyethylene glycol 3350 (MIRALAX) packet 17 g, 1 packet, Per G Tube, QDAY  rosuvastatin (CRESTOR) tablet 40 mg, 40 mg, Feeding Tube, QDAY  senna (SENOKOT) tablet 2 tablet, 2 tablet, Per G Tube, QHS  sertraline (ZOLOFT) tablet 50 mg, 50 mg, Oral, QDAY  thiamine mononitrate (vit B1) tablet 100 mg, 100 mg, Per NG tube, QDAY  trimethoprim/sulfamethoxazole (BACTRIM) oral suspension 160 mg, 160 mg, Oral, BID        PRN Medications:  bisacodyL QDAY PRN, dextrose 50% (D50) IV PRN, diclofenac sodium QID PRN, lidocaine hcl PRN Given at 11/03/23 0013, loperamide TID PRN 2 mg at 10/29/23 1834, oxyCODONE Q12H PRN 2.5 mg at 11/09/23 0121, pancrelipase 20,880 Units/sodium bicarbonate 650 mg (Renick CLOG DESTROYER) PRN (On Call from Rx), phenoL PRN 2 spray at 11/10/23 1834      Mental Status Exam:  General/Constitutional: appears stated age, good hygiene, dressed in personal attire  Eye Contact: minimal  Behavior: calm, cooperative  Speech: dysarthric, normal volume   Mood: horrible  Affect: dysthymic  Thought Process: Appears linear  Thought Content: reports suicidal ideation with plan, however, unable to articulate plan; denies HI; no evidence of delusions  Perception: denies AVH, not observed responding to internal stimuli  Orientation: Oriented to person only      Focused Physical Exam:  Neuro: bilateral gaze palsy, left facial weakness  Musculoskeletal: no obvious MSK abnormalities    [name] Baseman, DNP, APRN, PMHNP-BC, PMGT-BC  Nurse Practitioner, Psychiatry  Available via [employer]    Total Time Today was 50 minutes in the following activities: Preparing to see the patient, Obtaining and/or reviewing separately obtained history, Performing a medically appropriate examination and/or evaluation, Counseling and educating the patient/family/caregiver, Ordering medications, tests, or procedures, Documenting clinical information in the electronic or other health record, Independently interpreting results (not separately reported) and communicating results to the patient/family/caregiver, and Care coordination (not separately reported)

## 2023-11-11 NOTE — Progress Notes
 CLINICAL NUTRITION                                                        Clinical Nutrition Follow-Up Assessment     Name: Kevin Obrien   MRN: [id number]     DOB: 07/11/1950      Age: 73 y.o.  Admission Date: 10/28/2023     LOS: 14 days     Date of Service: 11/11/2023        Recommendation:  ADAT to least restrictive per SLP; currently NPO with ice chips.   EN Recs: Continue Bolus Feeds of Jevity 1.5 x5 cartons daily; administered as 1 + 1/4 cartons (295mL) QID .   Provides 1778kcal, 76g protein, free fluids  Additional fluids per primary team, with consideration for water boluses of 100mL before and after bolus feeds (200 mls QID) (provides additional 800mL daily for total fluids of 1701ml/d).   The nutrition-related order modifications are made in communication with the primary service, who remains responsible for the orders and overall care of the patient.    Comments:  Pt continues to be NPO with ice chips with EN providing 100% of nutrition. Continues on regimen of 1.25 cartons QID of Jevity 1.5, recieving bolus feeds at goal. Currently recieving 100 mls water QID with feeds. Noted sodium remains low, currently 133. Had VFSS 5/29, but will remain NPO with ice chips. Having small loose stools. Current EN regimen remains appropriate, but may benefit from adjustments in water flush regimen. Will continue to monitor.                            Nutrition Assessment of Patient:  Admit Weight: 59.7 kg (bed scale);  ;    BMI (Calculated): 21.8;    Pertinent Allergies/Intolerances: none       ;    Current EN Order: Boluses of Jevity 1.5, 1.25 cartons QID              Weight Used for Calculation: 59.7 kg  Estimated Calorie Needs: 1493-1791 (25-30kcal/kg ABW)  Estimated Protein Needs: 72-85 (1.2-1.5g/kg ABW)    Malnutrition Assessment:  Malnutrition present on admission  ICD-10 code E44: Chronic illness/Moderate non-severe malnutrition      Mild loss of muscle mass, Mild loss of body fat, Energy intake: Less than 75% of estimated energy requirement for 1 month or more            Malnutrition Interventions: EN recs to meet 100% estimated kcal and protein needs    Nutrition Focused Physical Exam:  Loss of Subcutaneous Fat: Yes; Severity: Mild; Location: Orbital, Triceps  Muscle Wasting: Yes; Severity: Moderate; Location: Clavicle, Temple, Deltoid  Physical Assessment:     Ascites: No  Pressure Injury: none documented      Nutrition Diagnosis:  Inadequate oral intake  Etiology: dysphagia 2/2 stroke  Signs & Symptoms: NPO status, SLP notes sole source EN                      Intervention / Plan:  Monitoring EN, labs, meds, GI function, diet advancement, and wt trends.            Aleck Crandall, MS, RDN, LD  Clinical Dietitian - Dept of Clinical Nutrition   Available  on [employer]

## 2023-11-12 LAB — POC GLUCOSE
~~LOC~~ BKR POC GLUCOSE: 226 mg/dL — ABNORMAL HIGH (ref 70–100)
~~LOC~~ BKR POC GLUCOSE: 126 mg/dL — ABNORMAL HIGH (ref 70–100)
~~LOC~~ BKR POC GLUCOSE: 148 mg/dL — ABNORMAL HIGH (ref 70–100)
~~LOC~~ BKR POC GLUCOSE: 203 mg/dL — ABNORMAL HIGH (ref 70–100)
~~LOC~~ BKR POC GLUCOSE: 278 mg/dL — ABNORMAL HIGH (ref 70–100)

## 2023-11-12 MED ORDER — DOXAZOSIN 2 MG PO TAB
4 mg | Freq: Every evening | GASTROSTOMY | 0 refills | Status: DC
Start: 2023-11-12 — End: 2023-11-22
  Administered 2023-11-13 – 2023-11-22 (×10): 4 mg via GASTROSTOMY

## 2023-11-12 MED ORDER — INSULIN ASPART 100 UNIT/ML SC FLEXPEN
17 [IU] | Freq: Four times a day (QID) | SUBCUTANEOUS | 0 refills | Status: DC
Start: 2023-11-12 — End: 2023-11-22
  Administered 2023-11-14 – 2023-11-18 (×2): 17 [IU] via SUBCUTANEOUS

## 2023-11-12 MED ORDER — INSULIN GLARGINE 100 UNIT/ML (3 ML) SC INJ PEN
16 [IU] | Freq: Every day | SUBCUTANEOUS | 0 refills | Status: DC
Start: 2023-11-12 — End: 2023-11-22

## 2023-11-12 NOTE — Progress Notes
 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  male admitted to [facility] on 10/28/2023 with the following issues: stroke      Rehabilitation Plan  Tentative discharge date: 11/19/2023  Rehabilitation: Patient will continue with comprehensive therapies at modified therapy schedule including physical therapy, occupational therapy, speech & language pathology, specialized rehab nursing, neuropsychology and physiatry oversight.    Goals:  Patient Will Perform Basic Cares With: Partial/moderate assistance, Progressing  Patient Will Perform Household Mobility With: Partial/moderate assistance, At wheelchair level  Recommended therapy after discharge: Consistent assistance, Home setting with home health, Versus, Skilled nursing facility  PT recommended equipment:  (light weight versus custom wheelchair)  OT recommended equipment: Tub transfer bench    Daily Functional Update:  Stand to Sit Assist Level: Partial/moderate assistance (11/11/2023  1:00 PM)  Equipment: Hand hold assist (11/11/2023  1:00 PM)     Stand Pivot Assist Level: Partial/moderate assistance (11/11/2023  1:00 PM)  Equipment: Hand hold assist (11/11/2023  1:00 PM)     Slideboard No data recorded  No data recorded   Gait Distance: 30, 50 x 2 (11/11/2023 11:00 AM)    Assist Level: Dependent (11/11/2023 11:00 AM)    Equipment: -- (UE support on PT arms) (11/11/2023 11:00 AM)     Wheelchair Distance 1: From room to gym (11/07/2023 10:10 AM)    Assist Level: Substantial/maximal assistance (11/07/2023 10:10 AM)     Toileting       Assist Level: Dependent (11/09/2023 10:15 AM)  Equipment: -- Jayson Steady) (11/07/2023 10:10 AM)     Toilet Transfer No data recorded   Upper Body Dressing Assist Level: Substantial/maximal assistance (10/29/2023  1:00 PM)     Lower Body Dressing Assist Level: Substantial/maximal assistance (11/08/2023  1:00 PM)       Diet: PO: Ice chips only (11/07/2023  1:30 PM)      Orientation Log & Cognitive Log Scores for the past 72 hrs (Last 3 readings):   Orientation Log Score (Out of 30)   11/11/23 1345 12/30             Current Medical Problems/Risks  of Medical Complications/Management    Acute Right Pontomedullary Ischemic Stroke  Proximal Right Intracranial Vertebral Artery Occlusion  Diffuse Intracranial Atherosclerosis  Bilateral Horizontal Gaze Palsy  History of Forced Left Gaze Deviation  Left Facial Palsy  Impairments: left hemiparesis, expressive aphasia, oropharyngeal dysphagia, right facial weakness, left facial droop with dysarthria, diplopia, concern for attention/cognitive impairments, LUE ataxia, neurogenic bowel, neurogenic bladder  Impaired mobility and ADL, impaired swallow and cognition/communication               > consult PT/OT/SLP and neuropsychology to evaluate and treat               > Continue Modafinil 100mg  BID (0600, 1200) - continue to monitor response   > Zoloft 50mg  daily   > Videoswallow study 5/29- silently aspirated thin liquids and penetrated mildly thick liquids, too much oral weakness/uncoordination to consider dysphagia diet safely at this point; continue NPO at this time, continue to encourage ice chips throughout the day under supervision               > Sleep optimization: melatonin 5mg  qhs, previously on seroquel nightly               > follow up: Neurology, PM&R, Cardiology     Secondary Stroke Risk Optimization  > Antiplatelet/Anticoagulation: DAPT with aspirin 81mg  daily and clopidogrel 75mg  daily for 90 days followed by aspirin 81mg  indefinitely  > Blood Pressure Goal: <160 during acute hospitalization, long term goal of < 130/80               - Current antihypertensive medications: amlodipine 5mg  daily, lisinopril 20mg  daily, metoprolol 25mg  BID  > Lipids: LDL is 161, with goal of <70: rosuvastatin 40mg  daily (per Cardiology recs)  > Blood Glucose: Hgb A1c is 11.7%, with goal of <7  > Tobacco abstinence goal     Bladder Management  Neurogenic Bladder with Retention       Urinary tract infection  -Post-Void (PVR) Bladder Scan (mL): 250 milliliters (11/09/2023  6:35 PM)  -Straight Cath (mL): 300 (11/12/2023  5:55 AM)  - One episode of self-void 5/28; doxazosin increased to 3mg   > Monitor urinary retention per unit protocol. If unable to void or PVRs >300cc perform clean ISC (intermittent straight catheterization) for volumes > . Stop checking BVIs if 3 consecutive volumes <150cc.   > Discuss foley placement vs ISC following treatment of UTI - bactrim 160mg  BID 5/31-6/3 (urine cx +e. Coli, sensitive to bactrim)  > Doxazosin 4mg  QHS (last increase 6/3)    Suicidal Ideation  Pseudobulbar Affect  Depression  - family denies knowledge of previous or current SI  - Per Neurology: Consider starting Dextromethorphan-Quinidine in the outpatient setting (unfortunately unavailble on the formulary inpatient) for Pseudobulbar affect, will start Fluoxetine 20mg  in the meantime --> instead was started on Sertraline 50mg , will continue while in rehab  - Per neuropsychology eval on 5/28: Pt?s reported mood was sad. Pt endorsed sadness and passive SI and also made a gesture of shooting himself. He reported that the last time he thought about it was yesterday.    > neuropsychology and psychiatry following   > Neurology clinic follow up on dc  > Psychiatry: continue CO, restart zoloft 6/2, continue to follow.      Dysphagia s/p PEG tube placement  Moderate Malnutrition  - PEG tube placement 5/15               > Dietician consult, appreciate tube feed recommendations-  transition from continuous to bolus feeds 5/19. Appreciate most updated nutrition recommendations, 5/27:  > ADAT to least restrictive per SLP; currently strict NPO with ice chips only when brightly alert   > EN Recs: Bolus Feeds of Jevity 1.5 x5 cartons daily; administered as 1 + 1/4 cartons ( ) QID  Provides 1778kcal, 76g protein, 901mL free fluids  Recommend at minimum water flushes of 30mL before and after each bolus feed for tube patency.   Additional fluids per primary team, with consideration for water boluses of 100mL before and after bolus feeds (provides additional 800mL daily for total fluids of 1770ml/d)  > Per videoswallow study 5/29- silently aspirated thin liquids and penetrated mildly thick liquids, too much oral weakness/uncoordination to consider dysphagia diet safely at this point; continue NPO at this time, continue to encourage ice chips throughout the day under supervision    Left Bell's Palsy  Discharge of the left eye - Improved  Left TMJ pain - Resolved  Ear Wax B/L ear - Improved               > Alternate eye patches; eyepatch for the L eye overnight, artificial tears TID daily in the setting of left facial palsy- increase drops to the left eye in the setting of L eye discharge. Monitor for ssx of infection to the left eye and consider erythromycin ointment if infection suspected   > lidocaine patch and tylenol to be continued as pt reports these improve left sided jaw pain   > Debrox added - discontinue 6/2    Oral Thrush               > oral care TID   > Nystatin swish and spit started 5/20, ok'd by SLP     Hyponatremia  Hyperkalemia- Resolved  - K 5.2 5/28 AM  - K 4.3 5/29 AM  - s/p lokelma x1 5/28   > monitor intermittent BMP    Daytime Sleepiness - Improved  Fatigue - Improved  Increased Somnolence - Resolved  - Concern for increased somnolence 5/20 leading to Neurology Consult- rec CTA H&N; discussed with team may attempt to liberalize BP I.e. decreasing amlodipine to 5mg  due to symptoms possibly 2/2 to hypoperfusion  - CTA Head and Neck - negative for acute change  - Infectious workup including UA, CXR- negative for acute concerns  > appreciate further neurology recommendations- if acute focal weakness, numbness, coma, etc. Stroke activate/send to ED as he is high risk to have progression of his stroke/basilar involvement. Signed off.   > monitor on modafinil BID    Bowel Management  At risk for Neurogenic Bowel  Loose stools (resolved)  Constipation  -Last Bowel Movement Date: 11/12/23 (11/12/2023  6:55 AM)  - Goal for a daily BM or every 24-48 hours.   - c diff negative 5/18  > adjust bowel regimen PRN   > loperamide PRN  > Senna 2 tabs QHS via g tube  > MiraLax daily via g tube     Hypertension  Orthostatic Hypotension  LVOT Obstruction  PFO  CAD s/p PCI               > metoprolol 25mg  BID               > amlodipine 5mg  daily, lisinopril 20mg  daily               > 30 day MCOT    > compression stockings to prevent sx orthostatic hypotension               >  Follow up with outpatient Cardiology      Diabetes Mellitus               > Endocrinology consult for ongoing discharge regimen planning    Aspiration Pneumonia - Resolved  S/p AHRF  - increased O2 req overnight 5/13 with elevated procal, tachycardia, leukocytosis  - CXR 5/13 notable for mild basal opacities, favored to represent atelectasis  - s/p vanc x1 and zosyn x3 days  - sputum cx NGTD  - Augmentin to complete total 7 day course of antibiotics through 5/20 AM    Nutrition:   Body mass index is 24.21 kg/m?SABRA  Malnutrition Details:  Malnutrition present on admission  ICD-10 code E44: Chronic illness/Moderate non-severe malnutrition      Mild loss of muscle mass, Mild loss of body fat, Energy intake: Less than 75% of estimated energy requirement for 1 month or more        Loss of Subcutaneous Fat: Yes Mild Orbital, Triceps  Muscle Wasting: Yes Moderate Clavicle, Temple, Deltoid               Malnutrition Interventions: EN recs to meet 100% estimated kcal and protein needs    Wounds:   Wounds Moisture associated skin damage Posterior Gluteal Cleft (Active)   Initial Assess Date/Initial Assess Time: 11/08/23 0900   Wound Type: Moisture associated skin damage  Orientation: Posterior  Location: Gluteal Cleft      Assessments 11/08/2023  8:00 PM 11/11/2023  9:30 AM   Wound Assessment Moist;Pink;Red;Non-blanchable Moist;Pink   Peri-wound Assessment Intact Intact   Wound Drainage Amount None None   Wound Dressing Status None/open to air None/open to air   Wound Care Treatment or ointment applied --   Wound Dressing and/or Treatment A & D ointment --       No associated orders.       DVT ppx: enoxaparin (LOVENOX) syringe 40 mg  QDAY(21); patient will not require at the time of discharge    Code Status: Full Code    Current Medications:  Scheduled Meds:acetaminophen (TYLENOL) tablet 650 mg, 650 mg, Feeding Tube, Q6H*  amLODIPine (NORVASC) tablet 5 mg, 5 mg, Per NG tube, QDAY  artificial tears (PF) single dose ophthalmic solution 2 drop, 2 drop, Both Eyes, TID  aspirin chewable tablet 81 mg, 81 mg, Per NG tube, QDAY  clopiDOGreL (PLAVIX) tablet 75 mg, 75 mg, Per NG tube, QDAY  diclofenac sodium (VOLTAREN) 1 % topical gel 2 g, 2 g, Topical, QID  Diet Enteral Feeding Bolus, , SEE ADMIN INSTRUCTIONS, QID (3,87,83,79)   And  Water Bolus, 100 mL, PEG Tube, QID (3,87,83,79)  doxazosin (CARDURA) tablet 4 mg, 4 mg, Feeding Tube, QHS  enoxaparin (LOVENOX) syringe 40 mg, 40 mg, Subcutaneous, QDAY(21)  insulin aspart (U-100) (NOVOLOG FLEXPEN U-100 INSULIN) injection PEN 0-12 Units, 0-12 Units, Subcutaneous, QID  insulin aspart (U-100) (NOVOLOG FLEXPEN U-100 INSULIN) injection PEN 16 Units, 16 Units, Subcutaneous, QID  insulin glargine (LANTUS SOLOSTAR U-100 INSULIN) injection PEN 18 Units, 18 Units, Subcutaneous, QDAY  lidocaine (LIDODERM) 5 % topical patch 1 patch, 1 patch, Topical, QDAY  lisinopriL (ZESTRIL) tablet 20 mg, 20 mg, Feeding Tube, QDAY  melatonin tablet 5 mg, 5 mg, Feeding Tube, QHS  metoprolol tartrate tablet 25 mg, 25 mg, Feeding Tube, BID  modafiniL (PROVIGIL) tablet 100 mg, 100 mg, Oral, BID  nystatin (MYCOSTATIN) oral suspension 500,000 Units, 500,000 Units, Swish & Spit, QID  petrolatum (STYE) ophthalmic ointment 0.25 inch, 0.25 inch, Both Eyes, TID  polyethylene glycol 3350 (MIRALAX) packet 17 g, 1 packet, Per G Tube, QDAY  rosuvastatin (CRESTOR) tablet 40 mg, 40 mg, Feeding Tube, QDAY  senna (SENOKOT) tablet 2 tablet, 2 tablet, Per G Tube, QHS  sertraline (ZOLOFT) tablet 50 mg, 50 mg, Oral, QDAY  thiamine mononitrate (vit B1) tablet 100 mg, 100 mg, Per NG tube, QDAY    Continuous Infusions:  PRN and Respiratory Meds:bisacodyL QDAY PRN, dextrose 50% (D50) IV PRN, diclofenac sodium QID PRN, lidocaine hcl PRN, loperamide TID PRN, oxyCODONE Q12H PRN, pancrelipase 20,880 Units/sodium bicarbonate 650 mg (Anson CLOG DESTROYER) PRN (On Call from Rx), phenoL PRN      Subjective     No acute events overnight. Patient alert and interactive this AM. Per nursing patient is doing better this AM after earlier timing of modafinil dose. Patient denies pain. Continues to have urinary retention. Last bowel movement 6/3.     Objective                          Vital Signs: Last Filed                 Vital Signs: 24 Hour Range BP: 126/52 (06/03 0954)  Temp: 36.4 ?C (97.5 ?F) (06/03 0541)  Pulse: 74 (06/03 0954)  Respirations: 17 PER MINUTE (06/03 0541)  SpO2: 95 % (06/03 0541)  O2 Device: None (Room air) (06/02 1603) BP: (110-137)/(52-66)   Temp:  [36.4 ?C (97.5 ?F)-37.1 ?C (98.8 ?F)]   Pulse:  [69-74]   Respirations:  [16 PER MINUTE-17 PER MINUTE]   SpO2:  [95 %-98 %]   O2 Device: None (Room air)     Vitals:    10/30/23 1511 11/04/23 0401 11/11/23 9361   Weight: 59.4 kg (131 lb) 63.7 kg (140 lb 6.9 oz) 66 kg (145 lb 8.1 oz)       Intake/Output Summary:  (Last 24 hours)    Intake/Output Summary (Last 24 hours) at 11/12/2023 1101  Last data filed at 11/12/2023 0600  Gross per 24 hour   Intake 1358 ml   Output 1300 ml   Net 58 ml        Last Bowel Movement Date: 11/12/23    Labs--reviewed  Results for orders placed or performed during the hospital encounter of 10/28/23 (from the past 24 hours)   POC GLUCOSE    Collection Time: 11/11/23 12:00 PM   # # Low-High    Glucose, POC 188 (H) 70 - 100 mg/dL   POC GLUCOSE    Collection Time: 11/11/23  4:39 PM   # # Low-High    Glucose, POC 302 (H) 70 - 100 mg/dL   POC GLUCOSE    Collection Time: 11/11/23  9:19 PM   # # Low-High    Glucose, POC 226 (H) 70 - 100 mg/dL   POC GLUCOSE    Collection Time: 11/12/23 12:48 AM   # # Low-High    Glucose, POC 203 (H) 70 - 100 mg/dL   POC GLUCOSE    Collection Time: 11/12/23  5:39 AM   # # Low-High    Glucose, POC 126 (H) 70 - 100 mg/dL         Physical Exam  General: No acute distress, sitting in bedside wheelchair, interactive  HEENT: oral mucosa moist, left eye without visible discharge  Heart: Extremities warm and well perfused  Lungs: Normal work of breathing on room air  Abdomen: Non distended, no tenderness to palpation, +  G tube without erythema or drainage  Extremities: Peripheral edema is not present  Psych: mood and affect seemingly congruent  Neuro: Alert, left facial paralysis, b/l horizontal gaze palsy. MMT notable for 5/5 shoulder abduction. Follows commands with cueing reminders I.e. was able to grasp fingers after asking him 3x     Labs & Therapy Notes Reviewed    Corean CHRISTELLA Brewster, DO   Physical Medicine & Rehabilitation  For patient care questions contact Rehab Medicine 3 FIRST CALL via Voalte.

## 2023-11-12 NOTE — Progress Notes
 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)    Postherpetic neuralgia    Essential tremor    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    Poorly controlled diabetes mellitus (CMS-HCC)    Nephrotic range proteinuria    Ischemic stroke (CMS-HCC)    Moderate malnutrition    On enteral nutrition    Adjustment disorder with depressed mood    Expressive aphasia      Endocrine Problem List:  Type 2 Diabetes - uncontrolled   A1C:  11.7% on 5.8.2025   Diabetes provider:  PCP   PTA meds:  non-adherant  Diabetes complications to include:   CAD / CVA / neuropathy / nephropathy / retinopathy  On enteral feeds    Recommendations:  Decrease:  Glargine 16 units SQ daily at noon  Increase:  Aspart 17 units SQ QID with bolus feeds  Aspart correction factor 4x daily prior to bolus feeds - mid dose  ________________________________________________________________________    HPI:  Kevin Obrien is a 73 y.o. male.      No missed feeds nor doses of aspart with bolus feeds.  Correction factor needs yesterday = 14 units.  Fasting glucose at goal.    Diet:  NPO x/ ice chips  Nutrition:    Diet Enteral Feeding Bolus  (Enteral Feeding Bolus With Water Bolus)     Discontinue     Question Answer Comment   Enteral Substance: Jevity 1.5    Amount Per Bolus: 1.25cartons or    Feeding Route: PEG/Gastrostomy       And Linked Group Details      Steroid:  none    Review of systems:  No fevers or hypoxia today.     PTA DM Meds:  Non-adherent to prandial insulin    Medications  Scheduled Meds:acetaminophen (TYLENOL) tablet 650 mg, 650 mg, Feeding Tube, Q6H*  amLODIPine (NORVASC) tablet 5 mg, 5 mg, Per NG tube, QDAY  artificial tears (PF) single dose ophthalmic solution 2 drop, 2 drop, Both Eyes, TID  aspirin chewable tablet 81 mg, 81 mg, Per NG tube, QDAY  clopiDOGreL (PLAVIX) tablet 75 mg, 75 mg, Per NG tube, QDAY  diclofenac sodium (VOLTAREN) 1 % topical gel 2 g, 2 g, Topical, QID  Diet Enteral Feeding Bolus, , SEE ADMIN INSTRUCTIONS, QID (3,87,83,79)   And  Water Bolus, 100 mL, PEG Tube, QID (3,87,83,79)  doxazosin (CARDURA) tablet 3 mg, 3 mg, Feeding Tube, QHS  enoxaparin (LOVENOX) syringe 40 mg, 40 mg, Subcutaneous, QDAY(21)  insulin aspart (U-100) (NOVOLOG FLEXPEN U-100 INSULIN) injection PEN 0-12 Units, 0-12 Units, Subcutaneous, QID  insulin aspart (U-100) (NOVOLOG FLEXPEN U-100 INSULIN) injection PEN 16 Units, 16 Units, Subcutaneous, QID  insulin glargine (LANTUS SOLOSTAR U-100 INSULIN) injection PEN 18  Units, 18 Units, Subcutaneous, QDAY  lidocaine (LIDODERM) 5 % topical patch 1 patch, 1 patch, Topical, QDAY  lisinopriL (ZESTRIL) tablet 20 mg, 20 mg, Feeding Tube, QDAY  melatonin tablet 5 mg, 5 mg, Feeding Tube, QHS  metoprolol tartrate tablet 25 mg, 25 mg, Feeding Tube, BID  modafiniL (PROVIGIL) tablet 100 mg, 100 mg, Oral, BID  nystatin (MYCOSTATIN) oral suspension 500,000 Units, 500,000 Units, Swish & Spit, QID  petrolatum (STYE) ophthalmic ointment 0.25 inch, 0.25 inch, Both Eyes, TID  polyethylene glycol 3350 (MIRALAX) packet 17 g, 1 packet, Per G Tube, QDAY  rosuvastatin (CRESTOR) tablet 40 mg, 40 mg, Feeding Tube, QDAY  senna (SENOKOT) tablet 2 tablet, 2 tablet, Per G Tube, QHS  sertraline (ZOLOFT) tablet 50 mg, 50 mg, Oral, QDAY  thiamine mononitrate (vit B1) tablet 100 mg, 100 mg, Per NG tube, QDAY  trimethoprim/sulfamethoxazole (BACTRIM) oral suspension 160 mg, 160 mg, Oral, BID    Continuous Infusions:  PRN and Respiratory Meds:bisacodyL QDAY PRN, dextrose 50% (D50) IV PRN, diclofenac sodium QID PRN, lidocaine hcl PRN, loperamide TID PRN, oxyCODONE Q12H PRN, pancrelipase 20,880 Units/sodium bicarbonate 650 mg (Louisa CLOG DESTROYER) PRN (On Call from Rx), phenoL PRN             Objective                       Vital Signs: Last Filed                 Vital Signs: 24 Hour Range   BP: 116/61 (06/03 0541)  Temp: 36.4 ?C (97.5 ?F) (06/03 0541)  Pulse: 69 (06/03 0541)  Respirations: 17 PER MINUTE (06/03 0541)  SpO2: 95 % (06/03 0541)  O2 Device: None (Room air) (06/02 1603) BP: (110-152)/(54-66)   Temp:  [36.4 ?C (97.5 ?F)-37.1 ?C (98.8 ?F)]   Pulse:  [69-75]   Respirations:  [16 PER MINUTE-17 PER MINUTE]   SpO2:  [95 %-98 %]   O2 Device: None (Room air)     Vitals:    10/30/23 1511 11/04/23 0401 11/11/23 0638   Weight: 59.4 kg (131 lb) 63.7 kg (140 lb 6.9 oz) 66 kg (145 lb 8.1 oz)         Physical Exam  Gen-sitting up in bed in nad  Head-at/nc  EENT-eomi  Pulm-on room air; no distress        Lab Review  Comprehensive Metabolic Profile    Lab Results   Component Value Date/Time    NA 133 (L) 11/11/2023 06:18 AM    K 4.9 11/11/2023 06:18 AM    CL 96 (L) 11/11/2023 06:18 AM    CO2 27 11/11/2023 06:18 AM    GAP 10 11/11/2023 06:18 AM    BUN 25 11/11/2023 06:18 AM    CR 0.97 11/11/2023 06:18 AM    GLU 132 (H) 11/11/2023 06:18 AM    Lab Results   Component Value Date/Time    CA 9.1 11/11/2023 06:18 AM    ALBUMIN 4.0 10/22/2023 07:01 PM    TOTPROT 7.3 10/22/2023 07:01 PM    ALKPHOS 71 10/22/2023 07:01 PM    AST 19 10/22/2023 07:01 PM    ALT 21 10/22/2023 07:01 PM    TOTBILI 0.8 10/22/2023 07:01 PM    GFR >60 11/11/2023 06:18 AM    GFRAA >60 02/22/2019 06:28 PM        Lab Results   Component Value Date    CHOL 216 (H) 10/17/2023  TRIG 219 (H) 10/17/2023    HDL 36 (L) 10/17/2023    LDL 161 (H) 10/17/2023    VLDL 43.8 10/17/2023    NONHDLCHOL 180 10/17/2023      TSH   Date Value Ref Range Status   06/05/2023 0.40 0.35 - 5.00 ?IU/mL Final     Microalbumin/CR ratio Urine   Date Value Ref Range Status   05/06/2017 285.85 (H) <30 ug/mg Final     Comment:     NOTE NEW REFERENCE RANGES           Glucose testing    Recent Labs     11/10/23  1645 11/10/23  2043 11/11/23  0638 11/11/23  1200 11/11/23  1639 11/11/23  2119 11/12/23  0048 11/12/23  0539   GLUPOC 323* 282* 137* 188* 302* 226* 203* 126*       Hemoglobin A1C   Date Value Ref Range Status   10/17/2023 11.7 (H) 4.0 - 5.7 % Final     Comment:     The ADA recommends that most patients with type 1 and type 2 diabetes maintain an A1c level <7%.           Anette CHRISTELLA Caldron, MD   [phone]

## 2023-11-12 NOTE — Progress Notes
 Acute Inpatient Rehabilitation Behavior Summary of Shift    Name: Kevin Obrien   MRN: [id number]     DOB: 06/28/1950      Age: 73 y.o.  Admission Date: 10/28/2023     LOS: 15 days     Date of Service: 11/12/2023      Reason for increased observation? Suicidal ideation  One-on-one safety watch: Yes  Q15-30 minute safety watch: No  Suicide precaution: Yes  Telemonitoring: No    Behavior outbursts during this shift? No  Description and time(s) of event: N/A   Agitation during this shift? Yes  Description and time(s) of event: Patient agitated, trying to get out of wheelchair in unsafe manner. Patient refusing assistance from staff.      Active suicide ideation during this shift? No   Description and time(s) of event: N/A    Poor safety awareness or impulsivity during this shift? Yes  Description and time(s) of event: Patient getting out of chair in unsafe manner    Noteworthy visitor interactions during this shift? Daughters at the end of shift  Sleep/rest patterns observed during this shift? None  Therapeutic interventions:    Redirection and Reorientation  Additional Comments: None

## 2023-11-12 NOTE — Rehab Team Conference
 Team Conference Note     Date of Admission:  10/28/2023  Date of Team Conference:  11/12/2023   Kevin Obrien is a 73 y.o. male.     DOB: April 07, 1951                  MRN#:  [id number]    Team Conference  Attendees: Alyce Crest, Attending Physician; Corean Brewster MD, Resident Physician; Clarita Hummer SW, Social Worker; Silvano Perry RN, Teaching laboratory technician; Macye Witmer, OTR, Occupational Therapy; KasiDee Cox, DPT, Physical Therapy; Kenzie Mcanulty, SLP, Speech Therapy; Chiquita Oneda CARSON, Recreational Therapist; Stevphen Fret PharmD, Pharmacy;  Halimo Librarian, academic, Nursing    Medical Update: Kevin Obrien is a 73 y.o. male admitted to rehabilitation for stroke with resultant impairments including weakness, visual impairment, bilateral horizontal gaze palsy, diplopia, L facial paralysis (bells palsy), R facial weakness (brainstem stroke), dysphagia, dysarthria .    Patient will continue to receive Physical therapy, Occupational therapy, and Speech therapy each 60 minutes a day, 5 days a week for the duration of the rehabilitation stay within an interdisciplinary rehabilitation program with rehab nursing and PM&R physician oversight.    Medical updates this week:   -Bladder Management: UTI on treatment, bactrim 5/31-6/3; given urinary retention despite increased doxazosin dose, tentative plan for foley discussion following eval after UTI treatment  -Increased modafinil to 100mg  BID, with improvement in daytime alertness and participation in therapy  -Psychiatry consult for SI which continues, restarted Zoloft 50mg  6/2  -ongoing treatment of oral thrush  -L TMJ pain resolved  -endocrinology assistance for DM management planning    Medical barriers to discharge: Psychiatry recommendations regarding ongoing SI. Tube feeds plan and teaching. Final discharge diabetes management plan. Neurogenic bladder management plan- possible foley, to be discussed following UTI treatment. There are no medical barriers to discharge regarding PICC line, anticoagulation, ongoing labs, appointment timing, new oxygen, new dialysis, wound vacuum.     Follow-up Appointments (list specialities, not appointment details): Neurology, PM&R, Cardiology, possibly Urology      Team Goal:  Overall Team Goal  Patient Will Perform Basic Cares With: Partial/moderate assistance, Progressing  Patient Will Perform Household Mobility With: Partial/moderate assistance, At wheelchair level    Discharge Planning     Discharge Date:  11/19/2023    Patient lives with his significant other and 57 y.o. daughter in a single family home that accomodates 1 level living. 2 STE.  Patient is independent with all ADLs and IADLs at baseline.  DME: [facility]  Pt is self employed. Owns a cleaning business.  Girlfriend works outside of the home. Pt's adult's daughters work and can't provide consistent supervision. [name] does provide oversight and checks on him daily.  Per daughters, pt would not have consistent supervision. They asked about caregivers through insurance. Pt only has [place] Medicaid. For him to be assessed for [employer] and Elderly Waiver he would need to have medical Medicaid. They would not be able to pay out of pocket. IF pt needs consistent supervision/assistance will need to go to SNF.  Will need to discharge to SNF. Family has in-network SNF list. Once off CO, can start working on referrals to placement.    PT Discharge Recommendations: Consistent assistance, Home setting with home health, Versus, Skilled nursing facility  OT Discharge Recommendations: Consistent supervision, Home setting with home health, Versus, Skilled nursing facility  PT Equipment Recommendations:  (light weight versus custom wheelchair)  OT Equipment Recommendations: Tub transfer bench  SLP DC Recommendations: consistent supervision; assistance with  meds/finances; ongoing ST    Rehabilitation Plan     Progress  Weekly PT Progress: improved partiicpation this date.  Decreased assist for transfers and gait  Weekly OT Progress: progressing with stand pivot transfers, tooth brushing, sitting balance/trunk control  ACE: 30/100  O-Log: 12/30; scores continue to fluctuate with recent downtrend in scores  Will trial memory book use for family education/training and assist with pt functional recall; will require staff assist for recordings  Benefits from verbal + visual instruction for command following/sequencing; allow increased time for processing  Difficulty noted with coin ID from vision perspective with counting money task  Variable cues for sequencing tasks  VSS completed 5/29; ongoing moderate-severe oropharyngeal dysphagia  Participatory in PO trials thin/mildly thick liquids, pureed solids; ongoing overt s/s aspiration  Increased encouragement for participation with swallow exercises  Oral cares throughout the day + ice chip protocol  Progression in POC has been limited from reduced motivation, low endurance, and occasional aggressive/resistive behaviors  Good initiation today, better motivation  Progressing with stand pivoting and trunk control  Motivated to brush his teeth  Modified schedule has been helping with his fatigue    Barriers/Concerns  Weekly PT Barriers/Concerns: Continues to be 2 person assist for gait due to variable distance,  High burden of care, vision deficits impacting depth perception and moving around objects  Weekly OT Barriers/Concerns: continued L face/throat/neck pain, vision deficits, mood, maximal to dependent assist for most ADLs  NPO  Unknown who will provide assistance with medication/finance management upon discharge  High burden of care   educated pt on his meds  pt is not in pain. His bottom is red needing frequent turns. Tries to get out of bed  pt gets confused and at times becomes agressive  Inconsistent participation  Continues to be a two-person assistance, variable distances  Vision deficits    Plan  gait training (hand hold assist, vector to help with posture vs rifton), neurogym for higher reps (consider using mirror for visual feedback), standing/reaching activities to promote upright posture, trial stairs as appropriate, nustep for conditioning, outcome measures as appropriate, continued wc ultralight propulsion; standing at plinth  LUE NMR, BUE coordination/strengthening, visual scanning, dynamic sitting balance, functional transfers, standing balance, standing for ADLs as able, all ADLs     Dietitian: Tube feeding recommendations. Still silently aspirating on video swallow last week. Resistive to po trials.  Neuropsychology: Still having SI per Psychiatry, requiring CO. OLog scores fluctuating.   Therapeutic Recreation: Left the guitar in his room for him to play, mood appears to improve with guitar.    Weekly Team Goals  Tube feedings  Retention    DM education plan  Tube feeding recommendations    Keep his room free of clutter    Verbal/visual instruction prior to activity    Offer ice chips throughout the day    Timed voiding and straight cathing    Goals   Short Term Goals (Weekly)  Bed Mobility Goals: Patient will perform supine to sit with, Patient will perform sit to supine with  Patient Will Perform Supine to Sit With: Partial/moderate assistance  Patient Will Perform Sit to Supine With: Partial/moderate assistance  Transfer Goals: Patient will complete sit to and from stand transfer with  Patient Will Complete Sit to and From Stand Transfer With: Partial/moderate assistance  Patient Will Ambulate With: 50 feet, Partial/moderate assistance, With device  Patient Will Ascend/Descend: 4 steps, Dependence, 2 rails  Short Term Goals (Weekly)  ADL Goals: Toileting, Footwear,  Lower body dressing  Patient Will Perform Toileting With: Partial/moderate assistance, Progressing  Patient Will Perform Footwear With: Partial/moderate assistance, Progressing  Patient Will Perform Lower Body Dressing With: Partial/moderate assistance, Progressing  Self-Care Transfers: All self-care transfers  Patient Will Perform All Self-Care Transfers With: Partial/moderate assistance, Progressing     Quality Indicators   Swallowing/Nutritional Status: Tube/parenteral feeding  Bladder Continence: 3-Incontinent daily (At least 1 episode/day) (retention)  Bowel continence: 2-Frequently incontinent (2+ episodes of incontinence, at least one continent)    If you have questions about the above note, please talk to the Physical Medicine & Rehabilitation team or your Social Worker/Nurse Case Manager.    When applicable, [facility] will update your insurance representative weekly to ensure continuation of coverage throughout your stay. Any changes to your insurance authorization throughout your stay will be conveyed to you. The [facility] System Billing Department can be contacted Monday-Friday (7:30am-5:30pm) by phone at [phone] or [phone].

## 2023-11-12 NOTE — Progress Notes
 SPEECH-LANGUAGE PATHOLOGY       11/12/23 0945   Behavior   Comments* Pt seated in wheelchair in therapy gym and received from PT. Pt alert and cooperative. Pt returned to bed following session and red flag handoff completed w/ RN and CO following session.   Dysphagia Goals   Therapy Activities PO trials;Swallow exercises   Swallow exercises Expiratory muscle strength trainer (EMST);Effortful swallow  (lingual protrusion)   PO Trials Thin liquids   Therapy Activity Comments Pt reported completing oral cares prior to session in which RN confirmed. Pt initially agreeable to PO trials however then became resistant with report of being cold. Provided further education re: importance of participation in PO trials and swallow exercises however pt pleasantly continued to decline. Pt consumed PO trials thin liquids x2 via medicine cup for 5cc water. No anterior loss noted however suspect piecemeal swallow. Throat clear x1 demonstrated but no other overt s/s aspiration noted although pt has hx of silent aspiration.    Continued with EMST Lite at lowest setting. Pt demonstrated moderate difficulty with adequate labial seal around device primarily to left side likely related to recent dx of Bell's Palsy. SLP provided tactile cues in attempt to improve labial seal which inconsistently was successful. Pt completed 3x5. Increased need for instruction required between each set.    Orientation Goals   Therapy Activities Questions   Therapy Activity Comments Attempted O-Log however pt demonstrating significantly increased processing time vs no response with cueing hierarchy. Attempted to revist later in session however resulted in pt holding hand to R ear and refusal of responses.   Problem Solving Goals   Therapy Activities Problem solving/reasoning   Therapy Activity Comments Problem solving/reasoning targeted in conjunction with visual scanning with parquetry blocks. Pt able to complete puzzle x1 gived max cues for strategy in addition to modifications of limiting visual clutter. Task modified to reduce complexity by providing target shapes/blocks only required for each puzzle however pt continued to require max cues.          Assessment Improved alertness but ongoing confusion and increased needs for cues/assistance throughout.   Plan Cognition:  -- O-log  -- Simple attention/memory  -- Visual scanning  -- Medication management  -- Finance management  -- Consider WAB-Bedside     Dysphagia:  -- PO trials thin/mildly thick liquids, pureed solids  -- EMST-Lite  -- Swallow exercises as able (effortful swallow, masako, lingual protrusion/holds)   -- Continue IOPI as able     Miriam Dills, MA CCC-SLP

## 2023-11-12 NOTE — Progress Notes
 Acute Inpatient Rehabilitation Behavior Summary of Shift    Name: Kevin Obrien   MRN: [id number]     DOB: 1951-01-28      Age: 73 y.o.  Admission Date: 10/28/2023     LOS: 15 days     Date of Service: 11/12/2023      Reason for increased observation? Suicidal Ideation  One-on-one safety watch: Yes  Q15-30 minute safety watch: No  Suicide precaution: Yes  Telemonitoring: No    Behavior outbursts during this shift? No  Description and time(s) of event: N/A   Agitation during this shift? Yes  Description and time(s) of event: On few occasions, patient getting agitated not wanting some medications. Patient complied after education given. Family by bedside assisted to redirect patient.    Active suicide ideation during this shift? No   Description and time(s) of event: N/A    Poor safety awareness or impulsivity during this shift? No  Description and time(s) of event: N/A    Noteworthy visitor interactions during this shift? Daughter  Sleep/rest patterns observed during this shift? Patient took few naps between therapy.  Therapeutic interventions:    Distraction and Redirection  Additional Comments: None

## 2023-11-13 LAB — ECG 12-LEAD
P AXIS: 67 degrees
P-R INTERVAL: 140 ms
Q-T INTERVAL: 410 ms
QRS DURATION: 122 ms
QTC CALCULATION (BAZETT): 470 ms
R AXIS: 62 degrees
T AXIS: 68 degrees
VENTRICULAR RATE: 79 {beats}/min

## 2023-11-13 LAB — SODIUM-URINE RANDOM: ~~LOC~~ BKR UR SODIUM, RAN: 74 mmol/L (ref 7–56)

## 2023-11-13 LAB — OSMOLALITY: ~~LOC~~ BKR OSMOLALITY, SERUM: 283 mosm/kg (ref 280–307)

## 2023-11-13 LAB — POC GLUCOSE
~~LOC~~ BKR POC GLUCOSE: 241 mg/dL — ABNORMAL HIGH (ref 70–100)
~~LOC~~ BKR POC GLUCOSE: 134 mg/dL — ABNORMAL HIGH (ref 70–100)
~~LOC~~ BKR POC GLUCOSE: 189 mg/dL — ABNORMAL HIGH (ref 70–100)
~~LOC~~ BKR POC GLUCOSE: 198 mg/dL — ABNORMAL HIGH (ref 70–100)

## 2023-11-13 LAB — OSMOLALITY-URINE RANDOM: ~~LOC~~ BKR UR. OSMOLALITY: 359 mosm/kg (ref 50–1400)

## 2023-11-13 MED ORDER — TRAZODONE(#) 10MG/ML PO SUSP
12.5 mg | Freq: Two times a day (BID) | ORAL | 0 refills | Status: DC | PRN
Start: 2023-11-13 — End: 2023-11-22
  Administered 2023-11-14: 05:00:00 12.5 mg via ORAL

## 2023-11-13 MED ORDER — MIRTAZAPINE 15 MG PO TAB
7.5 mg | Freq: Every evening | ORAL | 0 refills | Status: DC
Start: 2023-11-13 — End: 2023-11-14
  Administered 2023-11-14: 01:00:00 7.5 mg via ORAL

## 2023-11-13 MED ORDER — SODIUM ZIRCONIUM CYCLOSILICATE 10 GRAM PO PWPK
10 g | Freq: Once | ORAL | 0 refills | Status: CP
Start: 2023-11-13 — End: ?
  Administered 2023-11-13: 16:00:00 10 g via ORAL

## 2023-11-13 NOTE — Progress Notes
 PHYSICAL THERAPY  NOTE      Name: Kevin Obrien   MRN: [id number]     DOB: May 09, 1951      Age: 73 y.o.  Admission Date: 10/28/2023     LOS: 16 days     Date of Service: 11/13/2023         11/13/23 1345   Type of Note   Type of Note Daily   Time Calculation   Start Time 1315   Stop Time 1345   Time Calculation 30   Made Up Minutes 30   Minutes Made Up From 11/13/23   $$ PT Therapeutic Activity 1 unit   $$ Neuromuscular Re-education 1 unit    Subjective   Subjective Pt in bed recieving tube feeding upon room entry. Agreeable to therapy this afternoon. Pt denies having pain. Verbalizes that he declined therapy earlier in day because he was grumpy and tired. Pt returned to room at end of session, daughter present and beginning rec therapy.   Precautions   Comments L sided weakness, visual impairment (horizontal gaze palsy), PEG   Sit to and from Stand   Sit to and from Stand Sit to stand;Stand to sit   Sit to Stand   Patient Needs Assistance With Extra time to complete activity;Financial planner;Facilitation of trunk;Cues for positioning of hands;Balance;Assist of two;Cues for positioning of feet   Assist Level Partial/moderate assistance   Equipment Hand hold assist   Comments Pt now consistently pushing up from handrails/seated surface when completing sit to stands.   Stand to Sit   Patient Needs Assistance With Facilitation of weight shift;Balance;Cues for sequencing;Extra time to complete activity;Financial planner;Facilitation of hip flexion;Facilitation of trunk;Cues for positioning of hands;Assist of two   Assist Level Partial/moderate assistance   Equipment Hand hold assist   Comments Requires assist to weight shift and hip position when returning to sitting.   Wheelchair Distance 1   Distance 1 From room to gym   Type Ultralight wheelchair   Patient Needs Assistance With Turns;Path navigation;Propulsion   Assist Level Partial/moderate assistance   Propulsion Method Bilateral lower extremity   Comments Pt is not attempting to propell with BUE; propelling with BLE, needs assist for turns/pathway navigation and cues to use both legs and attention to task   Therapeutic Interventions   Therapeutic Activities Sit to stand repetitions from elevated surface while holding balloon; completed x5 reps, lowered table and complete x5 reps; patient grew fatigued and with slowed repetitions towards end of activity, but requiring CGA for safety for repetitions.   Neuromuscular Reeducation Side stepping/ambulation at plinth collecting bean bags and returning to oppositie side of plinth and placing in bucket, requiring mod assist physically and significnat verbal/tactile cues for upright posture/foot placement and reminders of task at hand.   Assessment   Assessment Pt bed mobility, sit to/from stand and transfers continuing to improve, however still requiring mod cueing and assist for activities. Pt ambulation at plinth with cueing improves upright posture/decreased full body anterior lean, but requiring significant tactile/verbal cueing for foot placement. Pt seated balance significantly improving, progressing sit to stand reps without use of UE from elevated surface today. Will continue to progress independence with all functional activities.   Plan   PT Plan gait training (hand hold assist, vector to help with posture vs rifton), neurogym for higher reps (consider using mirror for visual feedback), standing/reaching activities to promote upright posture, trial stairs as appropriate, nustep for conditioning, outcome measures as appropriate, continued wc ultralight propulsion; standing  at plinth activities         Therapist: Morene Greet, PT, DPT [phone]   Date: 11/13/2023

## 2023-11-13 NOTE — Progress Notes
 REC THERAPY DAILY NOTE    Name: Kevin Obrien   MRN: [id number]     DOB: 10/16/1950      Age: 73 y.o.  Admission Date: 10/28/2023     LOS: 16 days     Date of Service: 11/13/2023      Daily Note    Patient seen x1 this date.  Documentation reflects all daily treatment sessions.    Pt attended activity/group: Yes  Pt participated: Fully  Pt's interactions: Patient handoff from PT, seated in wheelchair. Patient held guitar in lap. Played chords with BUE. Primarily utilized RUE to strum. Required encouragement to engage in activity. Per conversation with daughter; patient enjoys music (Beatles, ABBA), card games, and being outside. Daughter shared patient is confused due to difficulty remembering what had happened to him. Provided supportive listening. Patient verbalized in Spanish needing to use bathroom. LPN and PCA present to assist. Patient was overall pleasant and calm during recreation therapy session.     Activity was: Publishing copy    Plan: Listening to music    Therapist: Chiquita Emperor, [place]   Date: 11/13/2023
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
## 2023-11-13 NOTE — Progress Notes
 Acute Inpatient Rehabilitation Behavior Summary of Shift    Name: Kevin Obrien   MRN: [id number]     DOB: [birth year]      Age: 73 y.o.  Admission Date: 10/28/2023     LOS: 16 days     Date of Service: 11/13/2023      Reason for increased observation? Suicidal Ideation  One-on-one safety watch: Yes  Q15-30 minute safety watch: No  Suicide precaution: No  Telemonitoring: No    Behavior outbursts during this shift? Yes  Description and time(s) of event: Patient attempting to hit on staff during administration of medication.  Agitation during this shift? Yes  Description and time(s) of event: Patient refusing medication administration  and attempting to hit on staff.      Active suicide ideation during this shift? No   Description and time(s) of event: N/A    Poor safety awareness or impulsivity during this shift? Yes  Description and time(s) of event: Patient pulling on peg tube.    Noteworthy visitor interactions during this shift? Daughter  Sleep/rest patterns observed during this shift? Patient rested between therapy.  Therapeutic interventions:    Music, Redirection, and Reorientation  Additional Comments: None

## 2023-11-13 NOTE — Progress Notes
 Acute Inpatient Rehabilitation Behavior Summary of Shift    Name: Kevin Obrien   MRN: [id number]     DOB: [birth year]      Age: 73 y.o.  Admission Date: 10/28/2023     LOS: 16 days     Date of Service: 11/13/2023      Reason for increased observation? Suicidal ideation  One-on-one safety watch: Yes  Q15-30 minute safety watch: No  Suicide precaution: Yes  Telemonitoring: No      Behavior outbursts during this shift? Yes  Description and time(s) of event: Impulsive and tried to get out of bed.   Agitation during this shift? Yes  Description and time(s) of event: Refused cares, not redirectable, attempted to hit staff.      Active suicide ideation during this shift? No   Description and time(s) of event: na    Poor safety awareness or impulsivity during this shift? Yes  Description and time(s) of event: Impulsive and tried to get out of bed    Noteworthy visitor interactions during this shift? no  Sleep/rest patterns observed during this shift? Patient was awake most of the night, slept.    Therapeutic interventions:    Distraction, Mobilization, and Stop activity  Additional Comments: Family at bedside and help to redirect patient.

## 2023-11-13 NOTE — Progress Notes
 PHYSICAL THERAPY  NOTE      Name: Kevin Obrien   MRN: [id number]     DOB: 08-13-1950      Age: 73 y.o.  Admission Date: 10/28/2023     LOS: 16 days     Date of Service: 11/13/2023         11/13/23 1040   Type of Note   Type of Note Daily   Time Calculation   Start Time 1015   Stop Time 1040   Time Calculation 25   Missed Minutes 20   Reasons for Missed Minutes Refused   Plan to Make Up Missed Minutes Will try to make up this afternoon   $$ PT Therapeutic Activity 2 units   Subjective   Subjective Pt in supine rolled onto R side upon room entry. Pt eyes shut, remaining shut for ~15 minutes while attempting to encourage therapeutic activity. Pt non-verbal throughout, intermittently answering questions by nodding head yes/no. Pt left in room after multiple failed attempts at therapy with CO at bedside   Precautions   Comments L sided weakness, visual impairment (horizontal gaze palsy), PEG   Therapeutic Interventions   Therapeutic Activities Majority of session spent attempting to encourage therapy participation with theraputic behavior strategies as discussed with care team. Therapist attempted to engage patient with personal preferences, provided multiple options for therapeutic intervention and given time/space to verbally describe his thoughts/wants and needs. Pt eyes remained closed for ~15 minutes, only opening eyes when head of bed elevated, was non verbal and only response was yes/no head nods intermitently to questions. Pt became more interactive with engagement from spanish speaking CO at bedside. Pt did not attempt to get to EOB.   Assessment   Assessment Pt did not participate in therapy despite utilizing skilled intervention and strategies to encourage participation.Pt denies pain/fatigue by shaking head 'no' when asked, but otherwise does not communicate as to why he is declining therapy. Pt mood and participation in therapy continues to be inconsistent despine behavioral strategies attempting to be implemented. Will continue to provide intervention as pt is agreeable.   Plan   PT Plan gait training (hand hold assist, vector to help with posture vs rifton), neurogym for higher reps (consider using mirror for visual feedback), standing/reaching activities to promote upright posture, trial stairs as appropriate, nustep for conditioning, outcome measures as appropriate, continued wc ultralight propulsion; standing at plinth (lateral stepping specific)         Therapist: Morene Greet, PT, DPT [phone]   Date: 11/13/2023

## 2023-11-13 NOTE — Progress Notes
 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  male admitted to [facility] on 10/28/2023 with the following issues: stroke      Rehabilitation Plan  Tentative discharge date: 11/19/2023  Rehabilitation: Patient will continue with comprehensive therapies at modified therapy schedule including physical therapy, occupational therapy, speech & language pathology, specialized rehab nursing, neuropsychology and physiatry oversight.    Goals:  Patient Will Perform Basic Cares With: Partial/moderate assistance, Progressing  Patient Will Perform Household Mobility With: Partial/moderate assistance, At wheelchair level  Recommended therapy after discharge: Consistent assistance, Home setting with home health, Versus, Skilled nursing facility  PT recommended equipment: Too early to determine (light weight versus custom wheelchair)  OT recommended equipment: Tub transfer bench    Daily Functional Update:  Stand to Sit Assist Level: Dependent (11/12/2023  2:45 PM)  Equipment: Hand hold assist (11/12/2023  2:45 PM)     Stand Pivot Assist Level: Dependent (11/12/2023  2:45 PM)  Equipment: Hand hold assist (11/12/2023  2:45 PM)     Slideboard No data recorded  No data recorded   Gait Distance: 120 feet, rest break, 180 feet (11/12/2023  9:45 AM)    Assist Level: Dependent (11/12/2023  9:45 AM)    Equipment: Specialty walker with harness (Rifton) (Added theraband behind thighs and tied to front of rifton to cue anterior propolsion of legs and improved upright posture) (11/12/2023  9:45 AM)     Wheelchair Distance 1: From room to gym (11/12/2023  9:45 AM)    Assist Level: Partial/moderate assistance (11/12/2023  9:45 AM)     Toileting       Assist Level: Dependent (11/09/2023 10:15 AM)  Equipment: -- Jayson Steady) (11/07/2023 10:10 AM)     Toilet Transfer No data recorded   Upper Body Dressing Assist Level: Substantial/maximal assistance (10/29/2023  1:00 PM)     Lower Body Dressing Assist Level: Substantial/maximal assistance (11/08/2023  1:00 PM)       Diet: PO: Ice chips only (11/07/2023  1:30 PM)      Orientation Log & Cognitive Log Scores for the past 72 hrs (Last 3 readings):  Orientation Log Score (Out of 30)   11/11/23 1345 12/30             Current Medical Problems/Risks of Medical Complications/Management    Acute Right Pontomedullary Ischemic Stroke  Proximal Right Intracranial Vertebral Artery Occlusion  Diffuse Intracranial Atherosclerosis  Bilateral Horizontal Gaze Palsy  History of Forced Left Gaze Deviation  Left Facial Palsy  Impairments: left hemiparesis, expressive aphasia, oropharyngeal dysphagia, right facial weakness, left facial droop with dysarthria, diplopia, concern for attention/cognitive impairments, LUE ataxia, neurogenic bowel, neurogenic bladder  Impaired mobility and ADL, impaired swallow and cognition/communication               > consult PT/OT/SLP and neuropsychology to evaluate and treat               > Continue Modafinil 100mg  BID (0600, 1200) - continue to monitor response   > Zoloft 50mg  daily   > Videoswallow study 5/29- silently aspirated thin liquids and penetrated mildly thick liquids, too much oral weakness/uncoordination to consider dysphagia diet safely at this point; continue NPO at this time, continue to encourage ice chips throughout the day under supervision               > Sleep optimization: melatonin 5mg  qhs, previously on seroquel nightly               > follow up: Neurology, PM&R, Cardiology     Secondary Stroke Risk Optimization  > Antiplatelet/Anticoagulation: DAPT with aspirin 81mg  daily and clopidogrel 75mg  daily for 90 days followed by aspirin 81mg  indefinitely  > Blood Pressure Goal: <160 during acute hospitalization, long term goal of < 130/80               - Current antihypertensive medications: amlodipine 5mg  daily, lisinopril 20mg  daily, metoprolol 25mg  BID  > Lipids: LDL is 161, with goal of <70: rosuvastatin 40mg  daily (per Cardiology recs)  > Blood Glucose: Hgb A1c is 11.7%, with goal of <7  > Tobacco abstinence goal     Bladder Management  Neurogenic Bladder with Retention       Urinary tract infection  -Post-Void (PVR) Bladder Scan (mL): 250 milliliters (11/09/2023  6:35 PM)  -Straight Cath (mL): 400 (11/13/2023  9:00 AM)  - One episode of self-void 5/28; doxazosin increased to 3mg   > Monitor urinary retention per unit protocol. If unable to void or PVRs >300cc perform clean ISC (intermittent straight catheterization) for volumes > . Stop checking BVIs if 3 consecutive volumes <150cc.   > Discuss foley placement vs ISC following treatment of UTI - bactrim 160mg  BID 5/31-6/3 (urine cx +e. Coli, sensitive to bactrim)  > Doxazosin 4mg  QHS (last increase 6/3)    Suicidal Ideation  Pseudobulbar Affect  Depression  - family denies knowledge of previous or current SI  - Per Neurology: Consider starting Dextromethorphan-Quinidine in the outpatient setting (unfortunately unavailble on the formulary inpatient) for Pseudobulbar affect, will start Fluoxetine 20mg  in the meantime --> instead was started on Sertraline 50mg , will continue while in rehab  - Per neuropsychology eval on 5/28: Pt?s reported mood was sad. Pt endorsed sadness and passive SI and also made a gesture of shooting himself. He reported that the last time he thought about it was yesterday.    > neuropsychology and psychiatry following   > Neurology clinic follow up on dc  > Psychiatry: continue CO, restart zoloft 6/2, continue to follow. Appreciate further recommendations regarding frustrated mood/agitation   -- Obtained repeat EKG with tentative  plan to start trazodone 50mg  QHS with 25mg  dose available as needed BID however Qtc 470- border of prolonged.    -- Follow up psychiatry recommendations 6/4 - communicated recent events and concerns for frustration/agitation toward staff with psychiatry team     Dysphagia s/p PEG tube placement  Moderate Malnutrition  - PEG tube placement 5/15               > Dietician consult, appreciate tube feed recommendations- transition from continuous to bolus feeds 5/19. Appreciate most updated nutrition recommendations, 5/27:  > ADAT to least restrictive per SLP; currently strict NPO with ice chips only when brightly alert   > EN Recs: Bolus Feeds of Jevity 1.5 x5 cartons daily; administered as 1 + 1/4 cartons ( ) QID  Provides 1778kcal, 76g protein, 901mL free fluids  Recommend at minimum water flushes of 30mL before and after each bolus feed for tube patency.   Additional fluids per primary team, with consideration for water boluses of 100mL before and after bolus feeds (provides additional 800mL daily for total fluids of 1730ml/d)  > Per videoswallow study 5/29- silently aspirated thin liquids and penetrated mildly thick liquids, too much oral weakness/uncoordination to consider dysphagia diet safely at this point; continue NPO at this time, continue to encourage ice chips throughout the day under supervision    Left Bell's Palsy  Discharge of the left eye - Improved  Left TMJ pain - Resolved  Ear Wax B/L ear - Improved  - s/p 1 week debrox               > Alternate eye patches; eyepatch for the L eye overnight, artificial tears TID daily in the setting of left facial palsy- increase drops to the left eye in the setting of L eye discharge. Monitor for ssx of infection to the left eye and consider erythromycin ointment if infection suspected   > lidocaine patch and tylenol to be continued as pt reports these improve left sided jaw pain    Oral Thrush               > oral care TID   > Nystatin swish and spit started 5/20, ok'd by SLP     Hyponatremia  Hyperkalemia  - K 5.2 5/28 AM  - K 4.3 5/29 AM  - s/p lokelma x1 5/28  - K 6/4 5.3  - Na 6/4 131   > monitor intermittent BMP   > lokelma x1 6/4   > obtain serum osm, urine osm, urine Na    Daytime Sleepiness - Improved  Fatigue - Improved  Increased Somnolence - Resolved  - Concern for increased somnolence 5/20 leading to Neurology Consult- rec CTA H&N; discussed with team may attempt to liberalize BP I.e. decreasing amlodipine to 5mg  due to symptoms possibly 2/2 to hypoperfusion  - CTA Head and Neck - negative for acute change  - Infectious workup including UA, CXR- negative for acute concerns  > appreciate further neurology recommendations- if acute focal weakness, numbness, coma, etc. Stroke activate/send to ED as he is high risk to have progression of his stroke/basilar involvement. Signed off.   > monitor on modafinil BID - may adjust dosing/timing of dosing in the setting of afternoon agitation, AM refusal to participate in therapy     Bowel Management  At risk for Neurogenic Bowel  Loose stools (resolved)  Constipation  -Last Bowel Movement Date: 11/13/23 (11/13/2023  9:00 AM)  - Goal for a daily BM or every  24-48 hours.   - c diff negative 5/18  > adjust bowel regimen PRN   > loperamide PRN  > Senna 2 tabs QHS via g tube  > MiraLax daily via g tube     Hypertension  Orthostatic Hypotension  LVOT Obstruction  PFO  CAD s/p PCI               > metoprolol 25mg  BID               > amlodipine 5mg  daily, lisinopril 20mg  daily               > 30 day MCOT    > compression stockings to prevent sx orthostatic hypotension               > Follow up with outpatient Cardiology      Diabetes Mellitus               > Endocrinology consult for ongoing discharge regimen planning    Aspiration Pneumonia - Resolved  S/p AHRF  - increased O2 req overnight 5/13 with elevated procal, tachycardia, leukocytosis  - CXR 5/13 notable for mild basal opacities, favored to represent atelectasis  - s/p vanc x1 and zosyn x3 days  - sputum cx NGTD  - Augmentin to complete total 7 day course of antibiotics through 5/20 AM    Nutrition:   Body mass index is 24.21 kg/m?SABRA  Malnutrition Details:  Malnutrition present on admission  ICD-10 code E44: Chronic illness/Moderate non-severe malnutrition      Mild loss of muscle mass, Mild loss of body fat, Energy intake: Less than 75% of estimated energy requirement for 1 month or more        Loss of Subcutaneous Fat: Yes Mild Orbital, Triceps  Muscle Wasting: Yes Moderate Clavicle, Temple, Deltoid               Malnutrition Interventions: EN recs to meet 100% estimated kcal and protein needs    Wounds:   Wounds Moisture associated skin damage Posterior Gluteal Cleft (Active)   Initial Assess Date/Initial Assess Time: 11/08/23 0900   Wound Type: Moisture associated skin damage  Orientation: Posterior  Location: Gluteal Cleft      Assessments 11/08/2023  8:00 PM 11/13/2023  9:00 AM   Wound Assessment Moist;Pink;Red;Non-blanchable Moist;Red   Peri-wound Assessment Intact Intact   Wound Drainage Amount None None   Wound Dressing Status None/open to air None/open to air   Wound Care Treatment or ointment applied Treatment or ointment applied Wound Dressing and/or Treatment A & D ointment A & D ointment       No associated orders.       DVT ppx: enoxaparin (LOVENOX) syringe 40 mg  QDAY(21); patient will not require at the time of discharge    Code Status: Full Code    Current Medications:  Scheduled Meds:acetaminophen (TYLENOL) tablet 650 mg, 650 mg, Feeding Tube, Q6H*  amLODIPine (NORVASC) tablet 5 mg, 5 mg, Per NG tube, QDAY  artificial tears (PF) single dose ophthalmic solution 2 drop, 2 drop, Both Eyes, TID  aspirin chewable tablet 81 mg, 81 mg, Per NG tube, QDAY  clopiDOGreL (PLAVIX) tablet 75 mg, 75 mg, Per NG tube, QDAY  diclofenac sodium (VOLTAREN) 1 % topical gel 2 g, 2 g, Topical, QID  Diet Enteral Feeding Bolus, , SEE ADMIN INSTRUCTIONS, QID (3,87,83,79)   And  Water Bolus, 100 mL, PEG Tube, QID (3,87,83,79)  doxazosin (CARDURA) tablet 4 mg, 4 mg, Feeding  Tube, QHS  enoxaparin (LOVENOX) syringe 40 mg, 40 mg, Subcutaneous, QDAY(21)  insulin aspart (U-100) (NOVOLOG FLEXPEN U-100 INSULIN) injection PEN 0-12 Units, 0-12 Units, Subcutaneous, QID  insulin aspart (U-100) (NOVOLOG FLEXPEN U-100 INSULIN) injection PEN 17 Units, 17 Units, Subcutaneous, QID  insulin glargine (LANTUS SOLOSTAR U-100 INSULIN) injection PEN 16 Units, 16 Units, Subcutaneous, QDAY  lidocaine (LIDODERM) 5 % topical patch 1 patch, 1 patch, Topical, QDAY  lisinopriL (ZESTRIL) tablet 20 mg, 20 mg, Feeding Tube, QDAY  melatonin tablet 5 mg, 5 mg, Feeding Tube, QHS  metoprolol tartrate tablet 25 mg, 25 mg, Feeding Tube, BID  modafiniL (PROVIGIL) tablet 100 mg, 100 mg, Oral, BID  nystatin (MYCOSTATIN) oral suspension 500,000 Units, 500,000 Units, Swish & Spit, QID  petrolatum (STYE) ophthalmic ointment 0.25 inch, 0.25 inch, Both Eyes, TID  polyethylene glycol 3350 (MIRALAX) packet 17 g, 1 packet, Per G Tube, QDAY  rosuvastatin (CRESTOR) tablet 40 mg, 40 mg, Feeding Tube, QDAY  senna (SENOKOT) tablet 2 tablet, 2 tablet, Per G Tube, QHS  sertraline (ZOLOFT) tablet 50 mg, 50 mg, Oral, QDAY  thiamine mononitrate (vit B1) tablet 100 mg, 100 mg, Per NG tube, QDAY    Continuous Infusions:  PRN and Respiratory Meds:bisacodyL QDAY PRN, dextrose 50% (D50) IV PRN, diclofenac sodium QID PRN, lidocaine hcl PRN, loperamide TID PRN, oxyCODONE Q12H PRN, pancrelipase 20,880 Units/sodium bicarbonate 650 mg ([place] CLOG DESTROYER) PRN (On Call from Rx), phenoL PRN      Subjective     Last evening, patient reluctant to allow for assistance with mobility for his safety.  Overnight, patient reportedly did not get much sleep.  This morning, patient seen during speech therapy session in the room.  Patient sitting in wheelchair with eyes closed and head down.  Will softly shake head or nod head yes/no to some questions otherwise refuses to interact with both therapist and physician team.  Patient patient otherwise alert given with physical exam attempts he responds to touch and actively prevents limb movement.     Objective                          Vital Signs: Last Filed                 Vital Signs: 24 Hour Range   BP: 132/58 (06/04 0853)  Temp: 36.4 ?C (97.5 ?F) (06/04 0600)  Pulse: 76 (06/04 0853)  Respirations: 16 PER MINUTE (06/04 0600)  SpO2: 98 % (06/04 0600)  O2 Device: None (Room air) (06/04 0600) BP: (132-147)/(53-70)   Temp:  [36.4 ?C (97.5 ?F)-37.2 ?C (98.9 ?F)]   Pulse:  [68-81]   Respirations:  [16 PER MINUTE]   SpO2:  [94 %-98 %]   O2 Device: None (Room air)     Vitals:    10/30/23 1511 11/04/23 0401 11/11/23 9361   Weight: 59.4 kg (131 lb) 63.7 kg (140 lb 6.9 oz) 66 kg (145 lb 8.1 oz)       Intake/Output Summary:  (Last 24 hours)    Intake/Output Summary (Last 24 hours) at 11/13/2023 1230  Last data filed at 11/13/2023 0900  Gross per 24 hour   Intake 2306 ml   Output 1975 ml   Net 331 ml        Last Bowel Movement Date: 11/13/23    Labs--reviewed  Results for orders placed or performed during the hospital encounter of 10/28/23 (from the past 24 hours)   POC GLUCOSE  Collection Time: 11/12/23  4:55 PM   # # Low-High    Glucose, POC 278 (H) 70 - 100 mg/dL   POC GLUCOSE    Collection Time: 11/12/23  9:43 PM   # # Low-High    Glucose, POC 241 (H) 70 - 100 mg/dL   POC GLUCOSE    Collection Time: 11/13/23  6:26 AM   # # Low-High    Glucose, POC 134 (H) 70 - 100 mg/dL   CBC CELLULAR THERAPEUTICS    Collection Time: 11/13/23  6:31 AM   # # Sharleen    White Blood Cells 6.50 4.50 - 11.00 10*3/uL    Red Blood Cells 3.66 (L) 4.40 - 5.50 10*6/uL    Hemoglobin 11.0 (L) 13.5 - 16.5 g/dL    Hematocrit 69.1 (L) 40.0 - 50.0 %    MCV 84.3 80.0 - 100.0 fL    MCH 30.2 26.0 - 34.0 pg    MCHC 35.8 32.0 - 36.0 g/dL    RDW 87.0 88.9 - 84.9 %    Platelet Count 278 150 - 400 10*3/uL    MPV 8.8 7.0 - 11.0 fL   BASIC METABOLIC PANEL CELLULAR THERAPEUTICS    Collection Time: 11/13/23  6:31 AM   # # Low-High    Sodium 131 (L) 137 - 147 mmol/L    Potassium 5.3 (H) 3.5 - 5.1 mmol/L    Chloride 97 (L) 98 - 110 mmol/L    Glucose 124 (H) 70 - 100 mg/dL    Blood Urea Nitrogen 22 7 - 25 mg/dL    Creatinine 9.04 9.59 - 1.24 mg/dL    Calcium 9.2 8.5 - 89.3 mg/dL    CO2 26 21 - 30 mmol/L    Anion Gap 8 3 - 12    Glomerular Filtration Rate (GFR) >60 >60 mL/min         Physical Exam - exam limited due to patient refusal  General: No acute distress, sitting in bedside wheelchair with eyes closed, head down  HEENT: NCAT  Heart: Extremities warm and well perfused  Lungs: Normal work of breathing on room air  Abdomen: +Gtube visualized through shirt  Extremities: Peripheral edema is not present  Psych: frustrated mood, deferring exam and discussion  Neuro: Retracts extremities when attempting to perform exam. Previous exam notable for left facial paralysis, b/l horizontal gaze palsy. MMT notable for 5/5 shoulder abduction. Follows commands with cueing reminders I.e. was able to grasp fingers after asking him 3x     Labs & Therapy Notes Reviewed    Corean CHRISTELLA Brewster, DO   Physical Medicine & Rehabilitation  For patient care questions contact Rehab Medicine 3 FIRST CALL via Voalte.

## 2023-11-13 NOTE — Case Management (ED)
 Case Management Progress Note    NAME:Kevin Obrien                          MRN: [id number]              DOB:[birth year]          AGE: 73 y.o.  ADMISSION DATE: 10/28/2023             DAYS ADMITTED: LOS: 16 days      Today's Date: 11/13/2023    PLAN:   Discharge planning to SNF. Not appropriate to send referrals due to CO for SI, agitated and physically aggressive towards staff.    Expected Discharge Date: 11/19/2023   Is Patient Medically Stable: No, Please explain: Rehab goals  Are there Barriers to Discharge? no    INTERVENTION/DISPOSITION:  Discharge Planning                 Attended team conference on 11/12/2023. Pt continues to have CO, agitation, and physical aggressive towards staff. Will send referrals to SNF when appropriate.     Transportation                 Support                 Info or Referral                 Positive SDOH Domains and Potential Barriers                   Medication Needs                                                                                                                                                         Estate manager/land agent                 Other                 Discharge Disposition  [name] [name], LMSW   Available on voalte  [phone]

## 2023-11-13 NOTE — Progress Notes
 SPEECH-LANGUAGE PATHOLOGY       11/13/23 0900   Behavior   Comments* Pt in bed with nursing staff present finishing straight cath + brief change after bowel incontinence. Nursing staff reported restless night and somnolence this AM despite receiving Modafinil medication. Pt only responding primarily via head shakes/nods with no instances of verbal responses. Pt remained with eyes closed throughout session and did not actively participate. Pt transferred back to bed and hand off completed with CO + interdisciplinary team.   Dysphagia Goals   Therapy Activity Comments Attempted oral cares with toothbrush/toothpaste as pt initially agreeable. Set-up assist provided however pt remained with eyes closed and no attempts for initiation. SLP allowed increased processing time + wait time in attempt to implement strategies as discussed in behavioral huddle, however pt remained with no initiation/participation in task. Attempted to place toothbrush in hand in which pt accepted and held in air for ~10 seconds before intentionally dropping into lap. Attempted to provide hand under hand assist + total assist however pt refusing cares. Pt did accept toothette swab with Nystatin rinse given total assist from SLP. Anterior loss of oral secretions noted after completion.   Problem Solving Goals   Therapy Activities Sequencing   Therapy Activity Comments Pt agreeable to transferring out of bed to wheelchair for oral cares. Pt required max cues for sitting upright and scooting to edge of bed in addition to hand/feet placement on Camie Ip. Increased physical assist required with transfers x2. SLP provided simple 1-step instructions, simple/limited language, cues before action, and increased processing time in addition to Spanish translation as assisted by CO; all attempts unsuccessful in increasing participation.         Assessment Session largely impacted by pt somnolence + reduced participation in session.   Plan Cognition:  -- O-log  -- Simple attention/memory  -- Visual scanning  -- Medication management  -- Finance management  -- Consider WAB-Bedside     Dysphagia:  -- PO trials thin/mildly thick liquids, pureed solids  -- EMST-Lite  -- Swallow exercises as able (effortful swallow, masako, lingual protrusion/holds)   -- Continue IOPI as able     Miriam Dills, MA CCC-SLP

## 2023-11-13 NOTE — Progress Notes
 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)    Postherpetic neuralgia    Essential tremor    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    Poorly controlled diabetes mellitus (CMS-HCC)    Nephrotic range proteinuria    Ischemic stroke (CMS-HCC)    Moderate malnutrition    On enteral nutrition    Adjustment disorder with depressed mood    Expressive aphasia      Endocrine Problem List:  Type 2 Diabetes - uncontrolled   A1C:  11.7% on 5.8.2025   Diabetes provider:  PCP   PTA meds:  non-adherant  Diabetes complications to include:   CAD / CVA / neuropathy / nephropathy / retinopathy  On enteral feeds    Recommendations:  Decrease:  Glargine 16 units SQ daily at noon  Aspart 17 units SQ QID with bolus feeds  Aspart correction factor 4x daily prior to bolus feeds - mid dose  ________________________________________________________________________    HPI:  Kevin Obrien is a 73 y.o. male.      No missed feeds nor doses of aspart with bolus feeds.  Correction factor needs yesterday = 8 units.  Fasting glucose at goal.    Diet:  NPO x/ ice chips  Nutrition:    Diet Enteral Feeding Bolus  (Enteral Feeding Bolus With Water Bolus)     Discontinue     Question Answer Comment   Enteral Substance: Jevity 1.5    Amount Per Bolus: 1.25cartons or    Feeding Route: PEG/Gastrostomy       And Linked Group Details      Steroid:  none    Review of systems:  No fevers or hypoxia today.     PTA DM Meds:  Non-adherent to prandial insulin    Medications  Scheduled Meds:acetaminophen (TYLENOL) tablet 650 mg, 650 mg, Feeding Tube, Q6H*  amLODIPine (NORVASC) tablet 5 mg, 5 mg, Per NG tube, QDAY  artificial tears (PF) single dose ophthalmic solution 2 drop, 2 drop, Both Eyes, TID  aspirin chewable tablet 81 mg, 81 mg, Per NG tube, QDAY  clopiDOGreL (PLAVIX) tablet 75 mg, 75 mg, Per NG tube, QDAY  diclofenac sodium (VOLTAREN) 1 % topical gel 2 g, 2 g, Topical, QID  Diet Enteral Feeding Bolus, , SEE ADMIN INSTRUCTIONS, QID (3,87,83,79)   And  Water Bolus, 100 mL, PEG Tube, QID (3,87,83,79)  doxazosin (CARDURA) tablet 4 mg, 4 mg, Feeding Tube, QHS  enoxaparin (LOVENOX) syringe 40 mg, 40 mg, Subcutaneous, QDAY(21)  insulin aspart (U-100) (NOVOLOG FLEXPEN U-100 INSULIN) injection PEN 0-12 Units, 0-12 Units, Subcutaneous, QID  insulin aspart (U-100) (NOVOLOG FLEXPEN U-100 INSULIN) injection PEN 17 Units, 17 Units, Subcutaneous, QID  insulin glargine (LANTUS SOLOSTAR U-100 INSULIN) injection PEN 16 Units, 16  Units, Subcutaneous, QDAY  lidocaine (LIDODERM) 5 % topical patch 1 patch, 1 patch, Topical, QDAY  lisinopriL (ZESTRIL) tablet 20 mg, 20 mg, Feeding Tube, QDAY  melatonin tablet 5 mg, 5 mg, Feeding Tube, QHS  metoprolol tartrate tablet 25 mg, 25 mg, Feeding Tube, BID  modafiniL (PROVIGIL) tablet 100 mg, 100 mg, Oral, BID  nystatin (MYCOSTATIN) oral suspension 500,000 Units, 500,000 Units, Swish & Spit, QID  petrolatum (STYE) ophthalmic ointment 0.25 inch, 0.25 inch, Both Eyes, TID  polyethylene glycol 3350 (MIRALAX) packet 17 g, 1 packet, Per G Tube, QDAY  rosuvastatin (CRESTOR) tablet 40 mg, 40 mg, Feeding Tube, QDAY  senna (SENOKOT) tablet 2 tablet, 2 tablet, Per G Tube, QHS  sertraline (ZOLOFT) tablet 50 mg, 50 mg, Oral, QDAY  thiamine mononitrate (vit B1) tablet 100 mg, 100 mg, Per NG tube, QDAY    Continuous Infusions:  PRN and Respiratory Meds:bisacodyL QDAY PRN, dextrose 50% (D50) IV PRN, diclofenac sodium QID PRN, lidocaine hcl PRN, loperamide TID PRN, oxyCODONE Q12H PRN, pancrelipase 20,880 Units/sodium bicarbonate 650 mg ([place] CLOG DESTROYER) PRN (On Call from Rx), phenoL PRN             Objective                       Vital Signs: Last Filed                 Vital Signs: 24 Hour Range   BP: 137/70 (06/04 0600)  Temp: 36.4 ?C (97.5 ?F) (06/04 0600)  Pulse: 68 (06/04 0600)  Respirations: 16 PER MINUTE (06/04 0600)  SpO2: 98 % (06/04 0600)  O2 Device: None (Room air) (06/04 0600) BP: (126-147)/(52-70)   Temp:  [36.4 ?C (97.5 ?F)-37.2 ?C (98.9 ?F)]   Pulse:  [68-81]   Respirations:  [16 PER MINUTE]   SpO2:  [94 %-98 %]   O2 Device: None (Room air)     Vitals:    10/30/23 1511 11/04/23 0401 11/11/23 0638   Weight: 59.4 kg (131 lb) 63.7 kg (140 lb 6.9 oz) 66 kg (145 lb 8.1 oz)         Physical Exam  Unchanged        Lab Review  Comprehensive Metabolic Profile    Lab Results   Component Value Date/Time    NA 131 (L) 11/13/2023 06:31 AM    K 5.3 (H) 11/13/2023 06:31 AM    CL 97 (L) 11/13/2023 06:31 AM    CO2 26 11/13/2023 06:31 AM    GAP 8 11/13/2023 06:31 AM    BUN 22 11/13/2023 06:31 AM    CR 0.95 11/13/2023 06:31 AM    GLU 124 (H) 11/13/2023 06:31 AM    Lab Results   Component Value Date/Time    CA 9.2 11/13/2023 06:31 AM    ALBUMIN 4.0 10/22/2023 07:01 PM    TOTPROT 7.3 10/22/2023 07:01 PM    ALKPHOS 71 10/22/2023 07:01 PM    AST 19 10/22/2023 07:01 PM    ALT 21 10/22/2023 07:01 PM    TOTBILI 0.8 10/22/2023 07:01 PM    GFR >60 11/13/2023 06:31 AM    GFRAA >60 02/22/2019 06:28 PM        Lab Results   Component Value Date    CHOL 216 (H) 10/17/2023    TRIG 219 (H) 10/17/2023    HDL 36 (L) 10/17/2023    LDL 161 (H) 10/17/2023    VLDL 43.8 10/17/2023    NONHDLCHOL 180  10/17/2023      TSH   Date Value Ref Range Status   06/05/2023 0.40 0.35 - 5.00 ?IU/mL Final     Microalbumin/CR ratio Urine   Date Value Ref Range Status   05/06/2017 285.85 (H) <30 ug/mg Final     Comment:     NOTE NEW REFERENCE RANGES           Glucose testing    Recent Labs     11/11/23  1639 11/11/23  2119 11/12/23  0048 11/12/23  0539 11/12/23  1202 11/12/23  1655 11/12/23  2143 11/13/23  0626   GLUPOC 302* 226* 203* 126* 148* 278* 241* 134*       Hemoglobin A1C   Date Value Ref Range Status   10/17/2023 11.7 (H) 4.0 - 5.7 % Final     Comment:     The ADA recommends that most patients with type 1 and type 2 diabetes maintain an A1c level <7%.           Anette CHRISTELLA Caldron, MD   [phone]

## 2023-11-13 NOTE — Progress Notes
 OCCUPATIONAL THERAPY  NOTE   Name: Kevin Obrien   MRN: [id number]     DOB: 13-Sep-1950      Age: 73 y.o.  Admission Date: 10/28/2023     LOS: 16 days     Date of Service: 11/13/2023         11/13/23 1445   Type of Note   Type of Note Daily   Time Calculation   Start Time 1445   Stop Time 1545   Time Calculation 60   $$ Phys ADL Skills 4 units   Subjective   Subjective Pt seated in bed on arrival/exit with alarm on and needs met/in reach   Precautions   Comments L sided weakness, visual impairment (horizontal gaze palsy), PEG   Cognitive   Patient Behavior Calm;Cooperative   Grooming   Patient Needs Assistance With Shaving   Assist Level Dependent   Position Sitting in chair   Comments therapist assisted with shaving face for safety and visual deficits   Bathing   Bath/Shower Soap and water shower/bath   Patient Needs Assistance With Buttocks;Left arm;Right arm;Left lower leg and foot;Right lower leg and foot;Standing balance;Sitting balance   Assist Level Dependent   Position Standing for perineal hygiene only   Comments Ax2 for safety when standing   Tub/Shower Transfer   Patient Needs Assistance With Wheelchair management;Facilitation of hip flexion;Facilitation of trunk;Facilitation of weight shift;Increased time to complete;Balance;Verbal cues;Transferring on;Transferring off;Assist of two   Transfer Type Stand pivot   Assist Level Dependent   Equipment Shower chair   Comments ModA of OT, CGA to minimal assist of another   Upper Body Dressing   Patient Needs Assistance With Shirt: threading or unthreading RUE;Shirt: pulling down or up over trunk;Balance   Assist Level Substantial/maximal assistance   Position Sitting edge of bed   Comments assist to open/orient shirt, balance, threading RUE, and pulling down over trunk   Lower Body Dressing   Patient Needs Assistance With Underwear: threading or unthreading RLE;Underwear: threading or unthreading LLE;Pants: threading or unthreading RLE;Pants: threading or unthreading LLE;Balance   Assist Level Substantial/maximal assistance   Position Standing only to pull pants over hips   Comments assist to thread BLE. Pt assisting to pull over hips   Footwear   Patient Needs Assistance With Shoes: applying or removing left;Shoes: fastening or unfastening right   Assist Level Dependent   Position Sitting edge of bed   Comments Pt declining to attempt participation in task   Toileting   Patient Needs Assistance With Adjusting clothing before toileting;Wiping self;Adjusting clothing after toileting;Balance   Assist Level Dependent   Position Standing for pants management;Standing for wiping   Comments attempted, no output   Toilet Transfer   Patient Needs Assistance With Facilitation of trunk;Facilitation of weight shift;Increased time to complete;Balance;Verbal cues;Transferring on;Transferring off;Assist of two   Transfer Type Stand pivot   Assist Level Dependent   Equipment Grab bar   Sit to Lying   Patient Needs Assistance With Trunk   Assist Level Partial/moderate assistance   Equipment No rails;HOB flat   Lying to Sitting on Side of Bed   Patient Needs Assistance With Trunk;Extra time to complete the activity   Assist Level Partial/moderate assistance   Equipment No rails;HOB flat   Sit to Stand   Patient Needs Assistance With Extra time to complete activity;Financial planner;Facilitation of trunk;Cues for positioning of hands;Balance;Assist of two;Cues for positioning of feet   Assist Level Dependent   Equipment Hand hold assist  Comments ModA of OT, CGA to MinA of another   Stand to Sit   Patient Needs Assistance With Facilitation of weight shift;Balance;Cues for sequencing;Extra time to complete activity;Financial planner;Facilitation of hip flexion;Facilitation of trunk;Cues for positioning of hands;Assist of two   Assist Level Dependent   Equipment Hand hold assist   Comments assist to reach back for arm rest and square up to wheelchair prior to sitting Chair/Bed-to-Chair Transfer   Chair/Bed-to-Chair Transfer Stand pivot   Stand Pivot Transfer   Patient Needs Assistance With Extra time to complete activity;Financial planner;Facilitation of hip flexion;Facilitation of trunk;Facilitation of weight shift;Cues for positioning of feet;Cues for positioning of hands;Cues for sequencing;Balance;Facilitation of hip extension   Assist Level Dependent   Equipment Hand hold assist   Assessment   Assessment Patient completed ADLs this session. Patient benefited from having CO interpret through session as patient chooses to use mix of spanish and english throughout. Patient participating in ADLs, however continues to require assist to complete LB dressing and safety when standing. Continue progressing towards goals   Plan   OT Plan LUE NMR, BUE coordination/strengthening, visual scanning, sitting/standing balance, ADL participation   Recommendations   OT Discharge Recommendations Consistent supervision;Skilled nursing facility   OT Equipment Recommendations Tub transfer bench       Therapist: Phyliss Quale, OTD, OTR/L  Date: 11/13/2023

## 2023-11-14 LAB — POC GLUCOSE
~~LOC~~ BKR POC GLUCOSE: 221 mg/dL — ABNORMAL HIGH (ref 70–100)
~~LOC~~ BKR POC GLUCOSE: 108 mg/dL — ABNORMAL HIGH (ref 70–100)
~~LOC~~ BKR POC GLUCOSE: 149 mg/dL — ABNORMAL HIGH (ref 70–100)
~~LOC~~ BKR POC GLUCOSE: 221 mg/dL — ABNORMAL HIGH (ref 70–100)

## 2023-11-14 LAB — ECG 12-LEAD
P AXIS: 49 degrees
P-R INTERVAL: 136 ms
Q-T INTERVAL: 416 ms
QRS DURATION: 120 ms
QTC CALCULATION (BAZETT): 445 ms
R AXIS: 51 degrees
T AXIS: 60 degrees
VENTRICULAR RATE: 69 {beats}/min

## 2023-11-14 MED ORDER — MIRTAZAPINE 15 MG PO TAB
7.5 mg | Freq: Every evening | ORAL | 0 refills | Status: DC
Start: 2023-11-14 — End: 2023-11-19
  Administered 2023-11-14 – 2023-11-19 (×5): 7.5 mg via ORAL

## 2023-11-14 MED ORDER — PROPRANOLOL 10 MG PO TAB
10 mg | Freq: Two times a day (BID) | NASOGASTRIC | 0 refills | Status: DC
Start: 2023-11-14 — End: 2023-11-22
  Administered 2023-11-15 – 2023-11-22 (×14): 10 mg via NASOGASTRIC

## 2023-11-14 MED ORDER — MODAFINIL 100 MG PO TAB
200 mg | Freq: Every day | ORAL | 0 refills | Status: DC
Start: 2023-11-14 — End: 2023-11-19
  Administered 2023-11-15 – 2023-11-19 (×5): 200 mg via ORAL

## 2023-11-14 MED ORDER — MODAFINIL 100 MG PO TAB
100 mg | Freq: Every day | ORAL | 0 refills | Status: DC
Start: 2023-11-14 — End: 2023-11-19
  Administered 2023-11-15 – 2023-11-17 (×3): 100 mg via ORAL

## 2023-11-14 NOTE — Progress Notes
 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  male admitted to [facility] on 10/28/2023 with the following issues: stroke      Rehabilitation Plan  Tentative discharge date: 11/19/2023  Rehabilitation: Patient will continue with comprehensive therapies at modified therapy schedule including physical therapy, occupational therapy, speech & language pathology, specialized rehab nursing, neuropsychology and physiatry oversight.    Goals:  Patient Will Perform Basic Cares With: Partial/moderate assistance, Progressing  Patient Will Perform Household Mobility With: Partial/moderate assistance, At wheelchair level  Recommended therapy after discharge: Consistent assistance, Home setting with home health, Versus, Skilled nursing facility  PT recommended equipment: Too early to determine (light weight versus custom wheelchair)  OT recommended equipment: Tub transfer bench    Daily Functional Update:  Stand to Sit Assist Level: Dependent (11/13/2023  2:45 PM)  Equipment: Hand hold assist (11/13/2023  2:45 PM)     Stand Pivot Assist Level: Dependent (11/13/2023  2:45 PM)  Equipment: Hand hold assist (11/13/2023  2:45 PM)     Slideboard No data recorded  No data recorded   Gait Distance: 120 feet, rest break, 180 feet (11/12/2023  9:45 AM)    Assist Level: Dependent (11/12/2023  9:45 AM)    Equipment: Specialty walker with harness (Rifton) (Added theraband behind thighs and tied to front of rifton to cue anterior propolsion of legs and improved upright posture) (11/12/2023  9:45 AM)     Wheelchair Distance 1: From room to gym (11/13/2023  1:45 PM)    Assist Level: Partial/moderate assistance (11/13/2023  1:45 PM)     Toileting       Assist Level: Dependent (11/13/2023  2:45 PM)  Equipment: -- Jayson Steady) (11/07/2023 10:10 AM)     Toilet Transfer No data recorded   Upper Body Dressing Assist Level: Substantial/maximal assistance (11/13/2023  2:45 PM)     Lower Body Dressing Assist Level: Substantial/maximal assistance (11/13/2023  2:45 PM)       Diet: PO: Ice chips only (11/07/2023  1:30 PM)      Orientation Log & Cognitive Log Scores for the past 72 hrs (Last 3 readings):  Orientation Log Score (Out of 30)   11/11/23 1345 12/30             Current Medical Problems/Risks of Medical Complications/Management    Acute Right Pontomedullary Ischemic Stroke  Proximal Right Intracranial Vertebral Artery Occlusion  Diffuse Intracranial Atherosclerosis  Bilateral Horizontal Gaze Palsy  History of Forced Left Gaze Deviation  Left Facial Palsy  Impairments: left hemiparesis, expressive aphasia, oropharyngeal dysphagia, right facial weakness, left facial droop with dysarthria, diplopia, concern for attention/cognitive impairments, LUE ataxia, neurogenic bowel, neurogenic bladder  Impaired mobility and ADL, impaired swallow and cognition/communication               > consult PT/OT/SLP and neuropsychology to evaluate and treat               > Continue Modafinil 100mg  BID (0600, 1200) - increase AM dose to 200mg  at 0530, 100mg  1200 6/6.   > Psych recommendations: d/c Zoloft 6/4 per Psych, start mirtazapine 7.5 QHS 6/4, trazodone 12.5mg  BID PRN (EKG Qtc 6/5 <470)               > Sleep optimization: melatonin 5mg  qhs, previously on seroquel nightly               > follow up: Neurology, PM&R, Cardiology     Secondary Stroke Risk Optimization  > Antiplatelet/Anticoagulation: DAPT with aspirin 81mg  daily and clopidogrel 75mg  daily for 90 days followed by aspirin 81mg  indefinitely  > Blood Pressure Goal: <160 during acute hospitalization, long term goal of < 130/80               - Current antihypertensive medications: amlodipine 5mg  daily, lisinopril 20mg  daily, metoprolol 25mg  BID  > Lipids: LDL is 161, with goal of <70: rosuvastatin 40mg  daily (per Cardiology recs)  > Blood Glucose: Hgb A1c is 11.7%, with goal of <7  > Tobacco abstinence goal     Bladder Management  Neurogenic Bladder with Retention       Urinary tract infection  -Post-Void (PVR) Bladder Scan (mL): 250 milliliters (11/09/2023  6:35 PM)  -Straight Cath (mL): 600 (11/14/2023  5:00 AM)  - One episode of self-void 5/28; doxazosin increased to 3mg   - s/p bactrim 160mg  bid 5/31-6/1 - urine cx +e. Coli, sensitive to bactrim  > Monitor urinary retention per unit protocol. If unable to void or PVRs >300cc perform clean ISC (intermittent straight catheterization) for volumes > . Stop checking BVIs if 3 consecutive volumes <150cc.   > Discuss foley placement vs ISC following treatment of UTI / improvement in mood and participation (pt was noted to be pulling on PEG tube, will not place foley at this time in case he does so with foley catheter  > Doxazosin 4mg  QHS (last increase 6/3)    Suicidal Ideation  Pseudobulbar Affect  Depression  - family denies knowledge of previous or current SI  - Per Neurology: Consider starting Dextromethorphan-Quinidine in the outpatient setting (unfortunately unavailble on the formulary inpatient) for Pseudobulbar affect, will start Fluoxetine 20mg  in the meantime --> instead was started on Sertraline 50mg , will continue while in rehab  - Per neuropsychology eval on 5/28: Pt?s reported mood was sad. Pt endorsed sadness and passive SI and also made a gesture of shooting himself. He reported that the last time he thought about it was yesterday.    > neuropsychology and psychiatry following   > Neurology clinic follow up on dc  > Appreciate Psychiatry Recommendations-    -- EKG 6/5-  Qtc <470.   -- Continue Mirtazapine 7.5mg  QHS for mood, sleep, and appetite   -- Trazodone 12.5mg  BID PRN for agitation    -- Pt continues to endorse SI with plan  - Continue CO     Dysphagia s/p PEG tube placement  Moderate Malnutrition  - PEG tube placement 5/15               > Dietician consult, appreciate tube feed recommendations- transition from continuous to bolus feeds 5/19. Appreciate most updated nutrition recommendations, 5/27:  > ADAT to least restrictive per SLP; currently strict NPO with ice chips only when brightly alert   > EN Recs: Bolus Feeds of Jevity 1.5 x5 cartons daily; administered as 1 + 1/4 cartons ( ) QID  Provides 1778kcal, 76g protein, 901mL free fluids  Recommend at minimum water flushes of 30mL before and after each bolus feed for tube patency.   Additional fluids per primary team, with consideration for water boluses of 100mL before and after bolus feeds (provides additional 800mL daily for total fluids of 1748ml/d)  > Per videoswallow study 5/29- silently aspirated thin liquids and penetrated mildly thick liquids, too much oral weakness/uncoordination to consider dysphagia diet safely at this point; continue NPO at this time, continue to encourage ice chips throughout the day under supervision    Left Bell's Palsy  Discharge of the left eye - Improved  Left TMJ pain - Resolved  Ear Wax B/L ear - Improved  - s/p 1 week debrox               > Alternate eye patches; eyepatch for the L eye overnight, artificial tears TID daily in the setting of left facial palsy- increase drops to the left eye in the setting of L eye discharge. Monitor for ssx of infection to the left eye and consider erythromycin ointment if infection suspected   > lidocaine patch and tylenol to be continued as pt reports these improve left sided jaw pain    Oral Thrush               > oral care TID   > Nystatin swish and spit started 5/20, ok'd by SLP     Hyponatremia - Improving  Hyperkalemia - Resolved  - K 6/4 5.3  - Na 6/4 131  - s/p lokelma most recently 6/4  - serum osm WNL   > monitor intermittent BMP   > fu urine osm, urine Na    Daytime Sleepiness - Improved  Fatigue - Improved  Increased Somnolence - Resolved  - Concern for increased somnolence 5/20 leading to Neurology Consult- rec CTA H&N; discussed with team may attempt to liberalize BP I.e. decreasing amlodipine to 5mg  due to symptoms possibly 2/2 to hypoperfusion  - CTA Head and Neck - negative for acute change  - Infectious workup including UA, CXR- negative for acute concerns  > appreciate further neurology recommendations- if acute focal weakness, numbness, coma, etc. Stroke activate/send to ED as he is high risk to have progression of his stroke/basilar involvement. Signed off.   > monitor on modafinil BID - ongoing adjustments as noted above to optimize alertness and participation though seems to be largely psych-related. Will consider Neurology consult 6/6 with change in modafinil dose does not help.     Bowel Management  At risk for Neurogenic Bowel  Loose stools (resolved)  Constipation  -Last Bowel Movement Date: 11/14/23 (11/14/2023  5:00 AM)  - Goal for a daily BM or every 24-48  hours.   - c diff negative 5/18  > adjust bowel regimen PRN   > loperamide PRN  > Senna 2 tabs QHS via g tube  > MiraLax daily via g tube Hypertension  Orthostatic Hypotension  LVOT Obstruction  PFO  CAD s/p PCI               > metoprolol 25mg  BID               > amlodipine 5mg  daily, lisinopril 20mg  daily               > 30 day MCOT    > compression stockings to prevent sx orthostatic hypotension               > Follow up with outpatient Cardiology      Diabetes Mellitus               > Endocrinology consult for ongoing discharge regimen planning    Aspiration Pneumonia - Resolved  S/p AHRF  - increased O2 req overnight 5/13 with elevated procal, tachycardia, leukocytosis  - CXR 5/13 notable for mild basal opacities, favored to represent atelectasis  - s/p vanc x1 and zosyn x3 days  - sputum cx NGTD  - Augmentin to complete total 7 day course of antibiotics through 5/20 AM    Nutrition:   Body mass index is 24.21 kg/m?SABRA  Malnutrition Details:  Malnutrition present on admission  ICD-10 code E44: Chronic illness/Moderate non-severe malnutrition      Mild loss of muscle mass, Mild loss of body fat, Energy intake: Less than 75% of estimated energy requirement for 1 month or more        Loss of Subcutaneous Fat: Yes Mild Orbital, Triceps  Muscle Wasting: Yes Moderate Clavicle, Temple, Deltoid               Malnutrition Interventions: EN recs to meet 100% estimated kcal and protein needs    Wounds:   Wounds Moisture associated skin damage Posterior Gluteal Cleft (Active)   Initial Assess Date/Initial Assess Time: 11/08/23 0900   Wound Type: Moisture associated skin damage  Orientation: Posterior  Location: Gluteal Cleft      Assessments 11/08/2023  8:00 PM 11/13/2023  8:56 PM   Wound Assessment Moist;Pink;Red;Non-blanchable Moist;Red   Peri-wound Assessment Intact Intact   Wound Drainage Amount None None   Wound Dressing Status None/open to air None/open to air   Wound Care Treatment or ointment applied Treatment or ointment applied   Wound Dressing and/or Treatment A & D ointment A & D ointment       No associated orders.       DVT ppx: enoxaparin (LOVENOX) syringe 40 mg  QDAY(21); patient will not require at the time of discharge    Code Status: Full Code    Current Medications:  Scheduled Meds:acetaminophen (TYLENOL) tablet 650 mg, 650 mg, Feeding Tube, Q6H*  amLODIPine (NORVASC) tablet 5 mg, 5 mg, Per NG tube, QDAY  artificial tears (PF) single dose ophthalmic solution 2 drop, 2 drop, Both Eyes, TID  aspirin chewable tablet 81 mg, 81 mg, Per NG tube, QDAY  clopiDOGreL (PLAVIX) tablet 75 mg, 75 mg, Per NG tube, QDAY  diclofenac sodium (VOLTAREN) 1 % topical gel 2 g, 2 g, Topical, QID  Diet Enteral Feeding Bolus, , SEE ADMIN INSTRUCTIONS, QID (3,87,83,79)   And  Water Bolus, 100 mL, PEG Tube, QID (3,87,83,79)  doxazosin (CARDURA) tablet 4 mg, 4 mg, Feeding Tube, QHS  enoxaparin (LOVENOX) syringe 40 mg, 40 mg, Subcutaneous, QDAY(21)  insulin aspart (U-100) (NOVOLOG FLEXPEN U-100 INSULIN) injection PEN 0-12 Units, 0-12 Units, Subcutaneous, QID  insulin aspart (U-100) (NOVOLOG FLEXPEN U-100 INSULIN) injection PEN 17 Units, 17 Units, Subcutaneous, QID  insulin glargine (LANTUS SOLOSTAR U-100 INSULIN) injection PEN 16 Units, 16 Units, Subcutaneous, QDAY  lidocaine (LIDODERM) 5 % topical patch 1 patch, 1 patch, Topical, QDAY  lisinopriL (ZESTRIL) tablet 20 mg, 20 mg, Feeding Tube, QDAY  melatonin tablet 5 mg, 5 mg, Feeding Tube, QHS  metoprolol tartrate tablet 25 mg, 25 mg, Feeding Tube, BID  mirtazapine (REMERON) tablet 7.5 mg, 7.5 mg, Oral, QHS  modafiniL (PROVIGIL) tablet 100 mg, 100 mg, Oral, BID  nystatin (MYCOSTATIN) oral suspension 500,000 Units, 500,000 Units, Swish & Spit, QID  petrolatum (STYE) ophthalmic ointment 0.25 inch, 0.25 inch, Both Eyes, TID  polyethylene glycol 3350 (MIRALAX) packet 17 g, 1 packet, Per G Tube, QDAY  rosuvastatin (CRESTOR) tablet 40 mg, 40 mg, Feeding Tube, QDAY  senna (SENOKOT) tablet 2 tablet, 2 tablet, Per G Tube, QHS  thiamine mononitrate (vit B1) tablet 100 mg, 100 mg, Per NG tube, QDAY    Continuous Infusions:  PRN and Respiratory Meds:bisacodyL QDAY PRN, dextrose 50% (D50) IV PRN, diclofenac sodium QID PRN, lidocaine hcl PRN, loperamide TID PRN, oxyCODONE Q12H PRN, pancrelipase 20,880 Units/sodium bicarbonate 650 mg ([place] CLOG DESTROYER) PRN (On Call from Rx), phenoL PRN, trazodone(#) BID PRN      Subjective     Seen this afternoon at bedside during recreational therapy and again following EKG. During rec therapy pt alert, interactive, responding to simple questions with yes/no, following simple exam commands. Denies pain. Denies concerns. Following EKG, about an hour or two after initial visit in the early afternoon during rec therapy, pt again alert. Pt followed simple commands with lifting each lower extremity and resisting with MMT of bilateral knee extension. Pt seems more fatigued at this time but still alert with eyes open and interactive. Last bowel movement 6/5.      Objective                          Vital Signs: Last Filed                 Vital Signs: 24 Hour Range   BP: 120/53 (06/05 0524)  Temp: 36.3 ?C (97.4 ?F) (06/05 9475)  Pulse: 71 (06/05 0524)  Respirations: 23 PER MINUTE (06/05 0524)  SpO2: 100 % (06/05 0524)  O2 Device: None (Room air) (06/05 0524) BP: (120-144)/(50-59)   Temp:  [36.3 ?C (97.4 ?F)-36.9 ?C (98.5 ?F)]   Pulse:  [71-76]   Respirations:  [18 PER MINUTE-23 PER MINUTE]   SpO2:  [97 %-100 %]   O2 Device: None (Room air)     Vitals:    10/30/23 1511 11/04/23 0401 11/11/23 9361   Weight: 59.4 kg (131 lb) 63.7 kg (140 lb 6.9 oz) 66 kg (145 lb 8.1 oz)       Intake/Output Summary:  (Last 24 hours)    Intake/Output Summary (Last 24 hours) at 11/14/2023 0754  Last data filed at 11/14/2023 0500  Gross per 24 hour   Intake 1752 ml   Output 2550 ml   Net -798 ml        Last Bowel Movement Date: 11/14/23    Labs--reviewed  Results for orders placed or performed during the hospital encounter of 10/28/23 (from the past 24 hours)  POC GLUCOSE    Collection Time: 11/13/23 12:47 PM   # # Low-High    Glucose, POC 189 (H) 70 - 100 mg/dL   OSMOLALITY-URINE RANDOM    Collection Time: 11/13/23  2:33 PM   # # Low-High    Osmolality-Urine 359 50 - 1,400 mOsm/kg   SODIUM-URINE RANDOM    Collection Time: 11/13/23  2:33 PM   # # Low-High    Urine Sodium 74 mmol/L   POC GLUCOSE    Collection Time: 11/13/23  4:49 PM   # # Low-High    Glucose, POC 198 (H) 70 - 100 mg/dL   POC GLUCOSE    Collection Time: 11/13/23  8:05 PM   # # Low-High    Glucose, POC 221 (H) 70 - 100 mg/dL   BASIC METABOLIC PANEL CELLULAR THERAPEUTICS    Collection Time: 11/14/23  5:58 AM   # # Low-High    Sodium 132 (L) 137 - 147 mmol/L    Potassium 4.8 3.5 - 5.1 mmol/L    Chloride 98 98 - 110 mmol/L    Glucose 108 (H) 70 - 100 mg/dL    Blood Urea Nitrogen 22 7 - 25 mg/dL    Creatinine 9.13 9.59 - 1.24 mg/dL    Calcium 9.1 8.5 - 89.3 mg/dL    CO2 25 21 - 30 mmol/L    Anion Gap 9 3 - 12    Glomerular Filtration Rate (GFR) >60 >60 mL/min   POC GLUCOSE    Collection Time: 11/14/23  6:30 AM   # # Low-High    Glucose, POC 108 (H) 70 - 100 mg/dL         Physical Exam  General: No acute distress, alert elderly male lying in bed   HEENT: NCAT, conjunctivae clear- no notable drainage  Heart: Extremities warm and well perfused  Lungs: Normal work of breathing on room air  Abdomen: +Gtube, abdomen non tender to palpation   Extremities: Peripheral edema is not present  Neuro: During rec therapy pt alert, interactive, responding to simple questions with yes/no, following simple exam commands including lifting each leg against gravity, participating in RUE fist bump. Following EKG, about an hour or two after initial visit, pt again alert. Pt followed simple commands with lifting each lower extremity and resisting with MMT of bilateral knee extension (5/5 bilaterally). Bilateral upper extremity shoulder abduction at least antigravity, finger grasp at least 4/5 bilaterally. Pt seems more fatigued during later afternoon visit but still alert with eyes open and interactive. Ongoing left facial paralysis, b/l horizontal gaze palsy.     Labs & Therapy Notes Reviewed    Corean CHRISTELLA Brewster, DO   Physical Medicine & Rehabilitation  For patient care questions contact Rehab Medicine 3 FIRST CALL via Voalte.

## 2023-11-14 NOTE — Progress Notes
 Acute Inpatient Rehabilitation Behavior Summary of Shift    Name: Kevin Obrien   MRN: [id number]     DOB: 10/04/1950      Age: 73 y.o.  Admission Date: 10/28/2023     LOS: 17 days     Date of Service: 11/14/2023      Reason for increased observation? Suicidal Ideation  One-on-one safety watch: Yes  Q15-30 minute safety watch: No  Suicide precaution: No  Telemonitoring: No    Behavior outbursts during this shift? No  Description and time(s) of event: na     Agitation during this shift? Yes  Description and time(s) of event: @ 0022 Patient in bed but restless, continuously turning and attempting to get out of bed    Active suicide ideation during this shift? No   Description and time(s) of event: NA    Poor safety awareness or impulsivity during this shift? No  Description and time(s) of event: NA    Noteworthy visitor interactions during this shift? No  Sleep/rest patterns observed during this shift? Patient able to fall and stayed at sleep after receiving PRN dose of trazodone at 0028   Therapeutic interventions:    Distraction and Redirection  Additional Comments: NA

## 2023-11-14 NOTE — Progress Notes
 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)    Postherpetic neuralgia    Essential tremor    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    Poorly controlled diabetes mellitus (CMS-HCC)    Nephrotic range proteinuria    Ischemic stroke (CMS-HCC)    Moderate malnutrition    On enteral nutrition    Adjustment disorder with depressed mood    Expressive aphasia      Endocrine Problem List:  Type 2 Diabetes - uncontrolled   A1C:  11.7% on 5.8.2025   Diabetes provider:  PCP   PTA meds:  non-adherant  Diabetes complications to include:   CAD / CVA / neuropathy / nephropathy / retinopathy  On enteral feeds    Recommendations:  Glargine 16 units SQ daily at noon  Aspart 17 units SQ QID with bolus feeds  Aspart correction factor 4x daily prior to bolus feeds - mid dose  ________________________________________________________________________    HPI:  Kevin Obrien is a 73 y.o. male.      No missed feeds nor doses of aspart with bolus feeds.  Correction factor needs yesterday = 6 units.  Fasting glucose at goal.    Family at the bedside today.    Diet:  NPO x/ ice chips  Nutrition:    Diet Enteral Feeding Bolus  (Enteral Feeding Bolus With Water Bolus)     Discontinue     Question Answer Comment   Enteral Substance: Jevity 1.5    Amount Per Bolus: 1.25cartons or    Feeding Route: PEG/Gastrostomy       And Linked Group Details      Steroid:  none    Review of systems:  No fevers or hypoxia today.     PTA DM Meds:  Non-adherent to prandial insulin    Medications  Scheduled Meds:acetaminophen (TYLENOL) tablet 650 mg, 650 mg, Feeding Tube, Q6H*  amLODIPine (NORVASC) tablet 5 mg, 5 mg, Per NG tube, QDAY  artificial tears (PF) single dose ophthalmic solution 2 drop, 2 drop, Both Eyes, TID  aspirin chewable tablet 81 mg, 81 mg, Per NG tube, QDAY  clopiDOGreL (PLAVIX) tablet 75 mg, 75 mg, Per NG tube, QDAY  diclofenac sodium (VOLTAREN) 1 % topical gel 2 g, 2 g, Topical, QID  Diet Enteral Feeding Bolus, , SEE ADMIN INSTRUCTIONS, QID (3,87,83,79)   And  Water Bolus, 100 mL, PEG Tube, QID (3,87,83,79)  doxazosin (CARDURA) tablet 4 mg, 4 mg, Feeding Tube, QHS  enoxaparin (LOVENOX) syringe 40 mg, 40 mg, Subcutaneous, QDAY(21)  insulin aspart (U-100) (NOVOLOG FLEXPEN U-100 INSULIN) injection PEN 0-12 Units, 0-12 Units, Subcutaneous, QID  insulin aspart (U-100) (NOVOLOG FLEXPEN U-100 INSULIN) injection PEN 17 Units, 17 Units, Subcutaneous, QID  insulin glargine (LANTUS SOLOSTAR U-100  INSULIN) injection PEN 16 Units, 16 Units, Subcutaneous, QDAY  lidocaine (LIDODERM) 5 % topical patch 1 patch, 1 patch, Topical, QDAY  lisinopriL (ZESTRIL) tablet 20 mg, 20 mg, Feeding Tube, QDAY  melatonin tablet 5 mg, 5 mg, Feeding Tube, QHS  metoprolol tartrate tablet 25 mg, 25 mg, Feeding Tube, BID  mirtazapine (REMERON) tablet 7.5 mg, 7.5 mg, Oral, QHS  modafiniL (PROVIGIL) tablet 100 mg, 100 mg, Oral, BID  nystatin (MYCOSTATIN) oral suspension 500,000 Units, 500,000 Units, Swish & Spit, QID  petrolatum (STYE) ophthalmic ointment 0.25 inch, 0.25 inch, Both Eyes, TID  polyethylene glycol 3350 (MIRALAX) packet 17 g, 1 packet, Per G Tube, QDAY  rosuvastatin (CRESTOR) tablet 40 mg, 40 mg, Feeding Tube, QDAY  senna (SENOKOT) tablet 2 tablet, 2 tablet, Per G Tube, QHS  thiamine mononitrate (vit B1) tablet 100 mg, 100 mg, Per NG tube, QDAY    Continuous Infusions:  PRN and Respiratory Meds:bisacodyL QDAY PRN, dextrose 50% (D50) IV PRN, diclofenac sodium QID PRN, lidocaine hcl PRN, loperamide TID PRN, oxyCODONE Q12H PRN, pancrelipase 20,880 Units/sodium bicarbonate 650 mg (Lane CLOG DESTROYER) PRN (On Call from Rx), phenoL PRN, trazodone(#) BID PRN             Objective                       Vital Signs: Last Filed                 Vital Signs: 24 Hour Range   BP: 162/57 (06/05 0845)  Temp: 36.3 ?C (97.4 ?F) (06/05 9475)  Pulse: 82 (06/05 0845)  Respirations: 23 PER MINUTE (06/05 0524)  SpO2: 100 % (06/05 0524)  O2 Device: None (Room air) (06/05 0524) BP: (120-162)/(50-59)   Temp:  [36.3 ?C (97.4 ?F)-36.9 ?C (98.5 ?F)]   Pulse:  [71-82]   Respirations:  [18 PER MINUTE-23 PER MINUTE]   SpO2:  [97 %-100 %]   O2 Device: None (Room air)     Vitals:    10/30/23 1511 11/04/23 0401 11/11/23 9361   Weight: 59.4 kg (131 lb) 63.7 kg (140 lb 6.9 oz) 66 kg (145 lb 8.1 oz)         Physical Exam  Gen-sitting up in chair in nad  Head-at/nc  EENT-generally, eyes closed throughout visit  Pulm-no distress; on room air  Abd-g tube in place        Lab Review  Comprehensive Metabolic Profile    Lab Results   Component Value Date/Time    NA 132 (L) 11/14/2023 05:58 AM    K 4.8 11/14/2023 05:58 AM    CL 98 11/14/2023 05:58 AM    CO2 25 11/14/2023 05:58 AM    GAP 9 11/14/2023 05:58 AM    BUN 22 11/14/2023 05:58 AM    CR 0.86 11/14/2023 05:58 AM    GLU 108 (H) 11/14/2023 05:58 AM    Lab Results   Component Value Date/Time    CA 9.1 11/14/2023 05:58 AM    ALBUMIN 4.0 10/22/2023 07:01 PM    TOTPROT 7.3 10/22/2023 07:01 PM    ALKPHOS 71 10/22/2023 07:01 PM    AST 19 10/22/2023 07:01 PM    ALT 21 10/22/2023 07:01 PM    TOTBILI 0.8 10/22/2023 07:01 PM    GFR >60 11/14/2023 05:58 AM    GFRAA >60 02/22/2019 06:28 PM        Lab Results   Component Value Date    CHOL 216 (H)  10/17/2023    TRIG 219 (H) 10/17/2023    HDL 36 (L) 10/17/2023    LDL 161 (H) 10/17/2023    VLDL 43.8 10/17/2023    NONHDLCHOL 180 10/17/2023      TSH   Date Value Ref Range Status 06/05/2023 0.40 0.35 - 5.00 ?IU/mL Final     Microalbumin/CR ratio Urine   Date Value Ref Range Status   05/06/2017 285.85 (H) <30 ug/mg Final     Comment:     NOTE NEW REFERENCE RANGES           Glucose testing    Recent Labs     11/12/23  1202 11/12/23  1655 11/12/23  2143 11/13/23  0626 11/13/23  1247 11/13/23  1649 11/13/23  2005 11/14/23  0630   GLUPOC 148* 278* 241* 134* 189* 198* 221* 108*       Hemoglobin A1C   Date Value Ref Range Status   10/17/2023 11.7 (H) 4.0 - 5.7 % Final     Comment:     The ADA recommends that most patients with type 1 and type 2 diabetes maintain an A1c level <7%.           Anette CHRISTELLA Caldron, MD   [phone]

## 2023-11-14 NOTE — Progress Notes
 PHYSICAL THERAPY  NOTE      Name: Kevin Obrien   MRN: [id number]     DOB: [birth year]      Age: 73 y.o.  Admission Date: 10/28/2023     LOS: 17 days     Date of Service: 11/14/2023         11/14/23 1415   Type of Note   Type of Note Daily   Time Calculation   Start Time 1400   Stop Time 1415   Time Calculation 15   Rehab Medical Excuse/No PT Tx 30 minutes   Reasons for Missed Minutes Refused   Subjective   Subjective Pt sitting up in bed, alert attempting to find something on TV with CO at bedside. Pt responding to questions from therapist however declines to participate in therapy verbalizing that he is waiting on his daughter to arrive and he wants therapy to return tomorrow. Further details of attempt at encouraging participation is below.   Precautions   Comments L sided weakness, visual impairment (horizontal gaze palsy), PEG   Therapeutic Interventions   Therapeutic Activities Attempted therapeutic strategies to encourage patient participation including explaining plans/goals of therapy session, standing on right side, giving patient options and reminding patient of why he was there and explaining that increased independence occurs by working with therapy. Pt awaiting arrival of daughter stating that I need to take care of my daughter. Therapist requested patient to explain, but patient would not elaborate on what he meant.   Assessment   Assessment Pt alert and communicating verbally upon therapist arrival in room. Therapist introduces self, goals for therapy today and encouraged patient participation. Pt verbalized in english to therapist and to CO in spanish that he does not want to participate in therapy at this time. Therapist attempted engagement with therapuetic behavior strategies, however patient requesting therapist to return tomorrow, despite reminders of where he was, why he is there and that participation in therapy would help him in his recovery from ischemic stroke. PT services to continue to follow while admitted.   Plan   PT Plan gait training (hand hold assist, vector to help with posture vs rifton), neurogym for higher reps (consider using mirror for visual feedback), standing/reaching activities to promote upright posture, trial stairs as appropriate, nustep for conditioning, outcome measures as appropriate, continued wc ultralight propulsion; standing at plinth activities   Recommendations   PT Discharge Recommendations Consistent assistance;Home setting with home health;Versus;Skilled nursing facility   PT Equipment Recommendations Too early to determine  (light weight versus custom wheelchair)         Therapist: Morene Greet, PT, DPT [phone]   Date: 11/14/2023

## 2023-11-14 NOTE — Progress Notes
 Acute Inpatient Rehabilitation Behavior Summary of Shift    Name: Kevin Obrien   MRN: [id number]     DOB: 07-14-1950      Age: 72 y.o.  Admission Date: 10/28/2023     LOS: 17 days     Date of Service: 11/14/2023      Reason for increased observation? impulsivity  One-on-one safety watch: No  Q15-30 minute safety watch: Yes  Suicide precaution: No  Telemonitoring: No    Behavior outbursts during this shift? No  Description and time(s) of event: N/A   Agitation during this shift? No  Description and time(s) of event: N/A      Active suicide ideation during this shift? No   Description and time(s) of event: N/A    Poor safety awareness or impulsivity during this shift?N/A No  Description and time(s) of event: N/A    Noteworthy visitor interactions during this shift? Pt seems to refuse care and become resistant when going into and coming out of sleep.  Pt takes a while to fully awaken and once fully aware pt is pleasant and compliant.  Sleep/rest patterns observed during this shift? Pt would sleep often through day.  Pt is more alert when seated in armchair and facing window.    Therapeutic interventions:    Mobilization, Music, Redirection, and Reorientation  Additional Comments: None

## 2023-11-14 NOTE — Progress Notes
 REC THERAPY DAILY NOTE    Name: Kevin Obrien   MRN: [id number]     DOB: 1951-02-09      Age: 73 y.o.  Admission Date: 10/28/2023     LOS: 17 days     Date of Service: 11/14/2023      Daily Note    Patient seen x1 this date.  Documentation reflects all daily treatment sessions.    Pt attended activity/group: Yes  Pt participated: Fully  Pt's interactions: Patient seated upright in bed, listened to music by The Beatles. Patient was calm during activity. Intermittently sang along. Became tearful when listening to [facility]. Shared the song reminded him of coming to the [place] . Resident MD present for rounding. When asked how he was, he stated happy.  Continued to engage in activity until CO arrived.     Activity was: Listening to music    Plan: Spend time outside, playing guitar    Therapist: Chiquita Emperor, [place]   Date: 11/14/2023

## 2023-11-14 NOTE — Progress Notes
 Psychiatric Consultation Progress Note    LOS: 17 days    Current Psychotropic Meds:   Mirtazapine 7.5 mg per G tube Q HS  Trazodone 12.5 mg per G tube BID PRN for agitation  Modafinil 100 mg per G tube BID    Psychiatric Assessment:  Adjustment disorder with mixed anxiety and depressed mood    Relevant Medical Problems:  Acute ischemic stroke, right pontomedullary  Expressive aphasia    Recommendations:  Continue mirtazapine 7.5 mg PO QHS for mood, sleep, and appetite.  Continue trazodone 12.5 mg PO BID PRN for agitation  EKG ordered to monitor QTc and replace electrolytes as needed.  Patient denies SI and does not have a plan or intention of harming himself or ending his own life. OK to discontinue CO and transition to 1:1 safety watch in the setting of impulsivity and elopement risk.   Neurorehabilitation Psychology consulted. Agree with and appreciate recommendations.  Psychiatry will continue to follow    Delirium recommendations:  - Avoid benzodiazepines, opiates and anticholinergics if possible as can contribute/cause delirium.   - Frequent reorientation, use of clocks/calendars, verbal reminders of current location and situation.   - A calm, comfortable environment that includes familiar objects from home.   - Involvement of family members.   - Uninterrupted periods of sleep at night, with low levels of noise and minimal light.   - Open blinds during the day to promote daytime alertness and a regular sleep-wake cycle       Reviewed EMR.  Reviewed Laboratory studies. QTc 445 on 6/5    Seen and discussed with: [name] Baseman, APRN    ATTESTATION     I personally performed or re-performed the history, physical exam and treatment for the E/M. I discussed the case with the Nurse Practitioner Student, and concur with the Nurse Practitioner Student documentation of history, physical exam and treatment plan unless otherwise noted.     [name] Baseman, DNP, APRN, PMHNP-BC, PMGT-BC  Nurse Practitioner, Psychiatry  Available via [employer]    Please feel free to contact us  with any additional questions or concerns by paging the consult team between 8am and 5pm on weekdays and between 8am and 3pm on weekends at [phone]. Otherwise, page the psychiatry resident on call.    --------------------------------------------------------------------------------------------------------------------------------  Subjective:  Kevin Obrien is a 73 year-old male seen at bedside for follow-up evaluation.  Patient is fatigued, frequently falling asleep during conversation and needing to be awakened. His behavior is calm and cooperative. He denies suicidal ideation. Patient does not have a plan or intention to harm himself or end his own life. He denies HI or AVH.     Review of Systems   Neurological:  Positive for focal weakness.   Psychiatric/Behavioral:  Positive for depression and suicidal ideas. Negative for hallucinations. The patient is nervous/anxious and has insomnia.         Objective:                   Vital Signs:  Current                Vital Signs: 24 Hour Range   BP: (P) 112/40 (06/05 1431)  Temp: (P) 36.9 ?C (98.4 ?F) (06/05 1431)  Pulse: (P) 70 (06/05 1431)  Respirations: (P) 20 PER MINUTE (06/05 1431)  SpO2: (P) 97 % (06/05 1431)  O2 Device: (P) None (Room air) (06/05 1431) BP: (120-162)/(50-59)   Temp:  [36.3 ?C (97.4 ?F)-36.9 ?C (98.5 ?F)]  Pulse:  [70-82]   Respirations:  [18 PER MINUTE-23 PER MINUTE]   SpO2:  [97 %-100 %]   O2 Device: (P) None (Room air)    Intensity Pain Scale (Self Report): (not recorded)      Scheduled Medications:  acetaminophen (TYLENOL) tablet 650 mg, 650 mg, Feeding Tube, Q6H*  amLODIPine (NORVASC) tablet 5 mg, 5 mg, Per NG tube, QDAY  artificial tears (PF) single dose ophthalmic solution 2 drop, 2 drop, Both Eyes, TID  aspirin chewable tablet 81 mg, 81 mg, Per NG tube, QDAY  clopiDOGreL (PLAVIX) tablet 75 mg, 75 mg, Per NG tube, QDAY  diclofenac sodium (VOLTAREN) 1 % topical gel 2 g, 2 g, Topical, QID  Diet Enteral Feeding Bolus, , SEE ADMIN INSTRUCTIONS, QID (3,87,83,79)   And  Water Bolus, 100 mL, PEG Tube, QID (3,87,83,79)  doxazosin (CARDURA) tablet 4 mg, 4 mg, Feeding Tube, QHS  enoxaparin (LOVENOX) syringe 40 mg, 40 mg, Subcutaneous, QDAY(21)  insulin aspart (U-100) (NOVOLOG FLEXPEN U-100 INSULIN) injection PEN 0-12 Units, 0-12 Units, Subcutaneous, QID  insulin aspart (U-100) (NOVOLOG FLEXPEN U-100 INSULIN) injection PEN 17 Units, 17 Units, Subcutaneous, QID  insulin glargine (LANTUS SOLOSTAR U-100 INSULIN) injection PEN 16 Units, 16 Units, Subcutaneous, QDAY  lidocaine (LIDODERM) 5 % topical patch 1 patch, 1 patch, Topical, QDAY  lisinopriL (ZESTRIL) tablet 20 mg, 20 mg, Feeding Tube, QDAY  melatonin tablet 5 mg, 5 mg, Feeding Tube, QHS  metoprolol tartrate tablet 25 mg, 25 mg, Feeding Tube, BID  mirtazapine (REMERON) tablet 7.5 mg, 7.5 mg, Oral, QHS  modafiniL (PROVIGIL) tablet 100 mg, 100 mg, Oral, BID  nystatin (MYCOSTATIN) oral suspension 500,000 Units, 500,000 Units, Swish & Spit, QID  petrolatum (STYE) ophthalmic ointment 0.25 inch, 0.25 inch, Both Eyes, TID  polyethylene glycol 3350 (MIRALAX) packet 17 g, 1 packet, Per G Tube, QDAY  rosuvastatin (CRESTOR) tablet 40 mg, 40 mg, Feeding Tube, QDAY  senna (SENOKOT) tablet 2 tablet, 2 tablet, Per G Tube, QHS  thiamine mononitrate (vit B1) tablet 100 mg, 100 mg, Per NG tube, QDAY        PRN Medications:  bisacodyL QDAY PRN, dextrose 50% (D50) IV PRN, diclofenac sodium QID PRN, lidocaine hcl PRN Given at 11/03/23 0013, loperamide TID PRN 2 mg at 10/29/23 1834, oxyCODONE Q12H PRN 2.5 mg at 11/13/23 0238, pancrelipase 20,880 Units/sodium bicarbonate 650 mg (Hydro CLOG DESTROYER) PRN (On Call from Rx), phenoL PRN 2 spray at 11/10/23 1834, trazodone(#) BID PRN 12.5 mg at 11/14/23 0028      Mental Status Exam:  General/Constitutional: appears stated age, good hygiene, dressed in personal attire  Speech/Motor: dysarthric, quiet volume  Mood/Affect:  shakes head, dysthymic  Thought Process: Limited communication  Thought Content: Denies SI/HI  Perception: Denies AVH, not observed to respond to internal stimuli  Insight/Judgment: Limited/limited    Orientation: Oriented to person only  Recent and Remote Memory: Limited recall  Attention span and concentration: Limited  Fund of knowledge and vocabulary: expressive aphasia    Focused Physical Exam:  Musculoskeletal: no obvious MSK abnormalities  Neuro: Bilateral gaze palsy, left facial weakness    [name] Baseman, DNP, APRN, PMHNP-BC, PMGT-BC  Nurse Practitioner, Psychiatry  Available via [employer]ate    Total Time Today was 35 minutes in the following activities: Preparing to see the patient, Obtaining and/or reviewing separately obtained history, Performing a medically appropriate examination and/or evaluation, and Documenting clinical information in the electronic or other health record

## 2023-11-14 NOTE — Progress Notes
 SPEECH-LANGUAGE PATHOLOGY       11/14/23 1000   Behavior   Comments* Pt asleep in bed upon entry with CO at bedside. Per CO + OT report, pt minimally responsive to verbal/tactile stimuli and remained with eyes present. Pt presented with similar presentation and  primarily remained with eyes closed and no verbal responses noted. Communicated to interdisciplinary team.   Attention Goals   Therapy Activity Comments Attempted to awake pt in which pt responsive to verbal/tactile stimuli however primarily remained with eyes closed with attempts. No verbal responses noted but occasional head shakes/nods. Prior to attempted task, pt nodding head yes in response to proposed activities but then no attempts for initiation with cues/increased time. Attempted to awake pt with warm washcloth and verbal instruction prior to applying to face however resulted in pt swatting hands at SLP. Attempted oral cares with toothettes however pt actively/forcefully closing lips together upon attempts. Of noted, utilized strategies as previously discussed in behavior huddle however unsuccessful this date.   Missed Treatment   Missed Minutes 50   Reasons for Missed Minutes Refused; communicated with interdisciplinary team in which Dr. Laurance medically excused missed minutes this AM.         Assessment Not progressing towards goals 2/2 minimal participation upon attempts, increased lethargy/somnolence, and refusal.   Plan Cognition:  -- O-log  -- Simple attention/memory  -- Visual scanning  -- Medication management  -- Finance management  -- Consider WAB-Bedside     Dysphagia:  -- PO trials thin/mildly thick liquids, pureed solids  -- EMST-Lite  -- Swallow exercises as able (effortful swallow, masako, lingual protrusion/holds)   -- Continue IOPI as able     Miriam Dills, MA CCC-SLP

## 2023-11-14 NOTE — Progress Notes
 OCCUPATIONAL THERAPY  NOTE   Name: Kevin Obrien   MRN: [id number]     DOB: Feb 23, 1951      Age: 73 y.o.  Admission Date: 10/28/2023     LOS: 17 days     Date of Service: 11/14/2023       11/14/23 0901   Type of Note   Type of Note Daily   Time Calculation   Start Time 0901   Stop Time 0916   Time Calculation 15   Rehab Medical Excuse/No OT Tx 30 minutes   Reasons for Missed Minutes Refused  (communicated with interdisciplinary team in which Dr. Laurance medically excused missed minutes this AM.)   $$ Therapeutic Activity 1 unit - 15 min   Subjective   Subjective Patient asleep in bed on OT arrival with CO at bedside. Patient difficult to arouse and minimally responsive to verbal/tactile stimuli. Communicated to interdisciplinary team   Therapeutic Interventions   Other Attempted to wake patient in which patient unresponsive to verbal/tactile stimuli and eyes remained closed with attempts. Patient with some groaning as responses and shaking head no. Attempted to wake patient with warm wash cloth and shaking head no at OT. Attempted oral cares in bed with toothbrush however patient forcing lips together with attempts.   Assessment   Assessment Patient unable to wake to participate in session and declining to wake with shaking head no despite attempts and education on importance of rehab and participating in therapy. Therapist utilizing all strategies discussed in behavior huddle, however unsuccessful this session.   Plan   OT Plan LUE NMR, BUE coordination/strengthening, visual scanning, sitting/standing balance, ADL participation   Recommendations   OT Discharge Recommendations Consistent supervision;Skilled nursing facility         Therapist: Phyliss Quale, OTD, OTR/L  Date: 11/14/2023

## 2023-11-15 LAB — POC GLUCOSE
~~LOC~~ BKR POC GLUCOSE: 114 mg/dL — ABNORMAL HIGH (ref 70–100)
~~LOC~~ BKR POC GLUCOSE: 119 mg/dL — ABNORMAL HIGH (ref 70–100)
~~LOC~~ BKR POC GLUCOSE: 139 mg/dL — ABNORMAL HIGH (ref 70–100)
~~LOC~~ BKR POC GLUCOSE: 234 mg/dL — ABNORMAL HIGH (ref 70–100)

## 2023-11-15 MED ORDER — WATER BOLUS (ENAR)
30 mL | Freq: Four times a day (QID) | GASTROSTOMY | 0 refills | Status: DC
Start: 2023-11-15 — End: 2023-11-22

## 2023-11-15 MED ORDER — TUBE FEED BOLUS (ENAR)
Freq: Four times a day (QID) | 0 refills | Status: DC
Start: 2023-11-15 — End: 2023-11-22

## 2023-11-15 NOTE — Progress Notes
 OCCUPATIONAL THERAPY  NOTE   Name: Kevin Obrien   MRN: [id number]     DOB: [birth year]      Age: 73 y.o.  Admission Date: 10/28/2023     LOS: 18 days     Date of Service: 11/15/2023       11/15/23 1400   Type of Note   Type of Note Daily   Time Calculation   Start Time 1400   Stop Time 1430   Time Calculation 30   Missed Minutes 15 Minutes   Reasons for Missed Minutes Nursing  (Nursing completing straight cath)   Plan to Make Up Missed Minutes Will attempt to make up later today - if not able to, will add to weekend hours   $$ Phys ADL Skills 2 units   Subjective   Subjective Pt supine in bed following straight cath and agreeable to OT. Pt seated in wheelchair in therapy gym on exit and hand off to PT   Precautions   Comments L sided weakness, visual impairment (horizontal gaze palsy), PEG   Lower Body Dressing   Patient Needs Assistance With Underwear: pulling up or down;Pants: pulling up or down   Assist Level Substantial/maximal assistance   Position Supine   Comments pt rolling in don tabbed brief. pt bridging hips to assist to pull over hips   Footwear   Patient Needs Assistance With Shoes: applying or removing right;Shoes: applying or removing left   Assist Level Dependent   Position Sitting in chair   Comments total assist to don shoes   Rolling Left and Right   Patient Needs Assistance With Trunk   Assist Level Partial/moderate assistance   Equipment With rails   Lying to Sitting on Side of Bed   Patient Needs Assistance With Trunk;Extra time to complete the activity   Assist Level Partial/moderate assistance   Equipment HOB elevated;With rails   Sit to and from Stand   Sit to and from Stand Sit to stand;Stand to sit   Sit to Stand   Patient Needs Assistance With Extra time to complete activity;Facilitation of trunk;Balance;Facilitation of hip extension   Assist Level Partial/moderate assistance   Equipment Hand hold assist   Comments ModA   Stand to Sit   Patient Needs Assistance With Facilitation of weight shift;Balance;Extra time to complete activity;Financial planner;Facilitation of hip flexion;Facilitation of trunk;Cues for positioning of hands   Assist Level Partial/moderate assistance   Equipment Hand hold assist   Comments cues to reach for wheelchair arm rests prior to sitting. cues to fully square up to chair prior to sitting   Stand Pivot Transfer   Patient Needs Assistance With Extra time to complete activity;Financial planner;Facilitation of hip flexion;Facilitation of trunk;Facilitation of weight shift;Cues for positioning of hands;Cues for sequencing;Balance;Facilitation of hip extension   Assist Level Partial/moderate assistance   Equipment Hand hold assist   Comments bed > wheelchair   Assessment   Assessment Patient pleasant and agreeable to therapy this session. Completes glute bridge to don pants. Progressing with stand pivot transfers this date. Patient continues to require assist to don shoes. Continue progressing towards goals   Plan   OT Plan LUE NMR, BUE coordination/strengthening, visual scanning, sitting/standing balance, ADL participation   Recommendations   OT Discharge Recommendations Consistent supervision;Skilled nursing facility         Therapist: Phyliss Quale, OTD, OTR/L  Date: 11/15/2023

## 2023-11-15 NOTE — Progress Notes
 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)    Postherpetic neuralgia    Essential tremor    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    Poorly controlled diabetes mellitus (CMS-HCC)    Nephrotic range proteinuria    Ischemic stroke (CMS-HCC)    Moderate malnutrition    On enteral nutrition    Adjustment disorder with depressed mood    Expressive aphasia      Endocrine Problem List:  Type 2 Diabetes - uncontrolled   A1C:  11.7% on 5.8.2025   Diabetes provider:  PCP   PTA meds:  non-adherant  Diabetes complications to include:   CAD / CVA / neuropathy / nephropathy / retinopathy  On enteral feeds    Recommendations:  Glargine 16 units SQ daily at noon  Aspart 17 units SQ QID with bolus feeds  Aspart correction factor 4x daily prior to bolus feeds - mid dose  ________________________________________________________________________    HPI:  Kevin Obrien is a 73 y.o. male.      No missed feeds nor doses of aspart with bolus feeds.  Correction factor needs yesterday = 4 units.  Fasting glucose at goal.    Family at the bedside this morning.    Diet:  NPO x/ ice chips  Nutrition:    Diet Enteral Feeding Bolus  (Enteral Feeding Bolus With Water Bolus)     Discontinue     Question Answer Comment   Enteral Substance: Jevity 1.5    Amount Per Bolus: 1.25cartons or    Feeding Route: PEG/Gastrostomy       And Linked Group Details      Steroid: none    Review of systems:  Slept well per family report.     PTA DM Meds:  Non-adherent to prandial insulin    Medications  Scheduled Meds:acetaminophen (TYLENOL) tablet 650 mg, 650 mg, Feeding Tube, Q6H*  amLODIPine (NORVASC) tablet 5 mg, 5 mg, Per NG tube, QDAY  artificial tears (PF) single dose ophthalmic solution 2 drop, 2 drop, Both Eyes, TID  aspirin chewable tablet 81 mg, 81 mg, Per NG tube, QDAY  clopiDOGreL (PLAVIX) tablet 75 mg, 75 mg, Per NG tube, QDAY  diclofenac sodium (VOLTAREN) 1 % topical gel 2 g, 2 g, Topical, QID  Diet Enteral Feeding Bolus, , SEE ADMIN INSTRUCTIONS, QID (3,87,83,79)   And  Water Bolus, 100 mL, PEG Tube, QID (3,87,83,79)  doxazosin (CARDURA) tablet 4 mg, 4 mg, Feeding Tube, QHS  enoxaparin (LOVENOX) syringe 40 mg, 40 mg, Subcutaneous, QDAY(21)  insulin aspart (U-100) (NOVOLOG FLEXPEN U-100 INSULIN) injection PEN 0-12 Units, 0-12 Units, Subcutaneous, QID  insulin aspart (U-100) (NOVOLOG FLEXPEN U-100 INSULIN) injection PEN 17 Units, 17 Units, Subcutaneous, QID  insulin glargine (LANTUS SOLOSTAR U-100  INSULIN) injection PEN 16 Units, 16 Units, Subcutaneous, QDAY  lidocaine (LIDODERM) 5 % topical patch 1 patch, 1 patch, Topical, QDAY  lisinopriL (ZESTRIL) tablet 20 mg, 20 mg, Feeding Tube, QDAY  melatonin tablet 5 mg, 5 mg, Feeding Tube, QHS  mirtazapine (REMERON) tablet 7.5 mg, 7.5 mg, Oral, QHS  modafiniL (PROVIGIL) tablet 200 mg, 200 mg, Oral, QDAY   And  modafiniL (PROVIGIL) tablet 100 mg, 100 mg, Oral, QDAY  nystatin (MYCOSTATIN) oral suspension 500,000 Units, 500,000 Units, Swish & Spit, QID  petrolatum (STYE) ophthalmic ointment 0.25 inch, 0.25 inch, Both Eyes, TID  polyethylene glycol 3350 (MIRALAX) packet 17 g, 1 packet, Per G Tube, QDAY  propranoloL (INDERAL) tablet 10 mg, 10 mg, Per NG tube, BID  rosuvastatin (CRESTOR) tablet 40 mg, 40 mg, Feeding Tube, QDAY  senna (SENOKOT) tablet 2 tablet, 2 tablet, Per G Tube, QHS  thiamine mononitrate (vit B1) tablet 100 mg, 100 mg, Per NG tube, QDAY    Continuous Infusions:  PRN and Respiratory Meds:bisacodyL QDAY PRN, dextrose 50% (D50) IV PRN, diclofenac sodium QID PRN, lidocaine hcl PRN, loperamide TID PRN, oxyCODONE Q12H PRN, pancrelipase 20,880 Units/sodium bicarbonate 650 mg ([place] CLOG DESTROYER) PRN (On Call from Rx), phenoL PRN, trazodone(#) BID PRN             Objective                       Vital Signs: Last Filed                 Vital Signs: 24 Hour Range   BP: 107/52 (06/06 0408)  Temp: 36.3 ?C (97.3 ?F) (06/06 0408)  Pulse: 70 (06/06 0408)  Respirations: 18 PER MINUTE (06/06 0408)  SpO2: 98 % (06/06 0408)  O2 Device: None (Room air) (06/06 0408) BP: (107-162)/(40-67)   Temp:  [36.3 ?C (97.3 ?F)-36.9 ?C (98.4 ?F)]   Pulse:  [70-82]   Respirations:  [18 PER MINUTE-20 PER MINUTE]   SpO2:  [97 %-98 %]   O2 Device: None (Room air)     Vitals:    10/30/23 1511 11/04/23 0401 11/11/23 9361   Weight: 59.4 kg (131 lb) 63.7 kg (140 lb 6.9 oz) 66 kg (145 lb 8.1 oz)         Physical Exam  Gen-resting in nad  Head-at/nc  EENT-eyes closed throughout visit  Pulm-no distress; on room air  Abd-g tube in place        Lab Review  Comprehensive Metabolic Profile    Lab Results   Component Value Date/Time    NA 132 (L) 11/14/2023 05:58 AM    K 4.8 11/14/2023 05:58 AM    CL 98 11/14/2023 05:58 AM    CO2 25 11/14/2023 05:58 AM    GAP 9 11/14/2023 05:58 AM    BUN 22 11/14/2023 05:58 AM    CR 0.86 11/14/2023 05:58 AM    GLU 108 (H) 11/14/2023 05:58 AM    Lab Results   Component Value Date/Time    CA 9.1 11/14/2023 05:58 AM    ALBUMIN 4.0 10/22/2023 07:01 PM    TOTPROT 7.3 10/22/2023 07:01 PM    ALKPHOS 71 10/22/2023 07:01 PM    AST 19 10/22/2023 07:01 PM    ALT 21 10/22/2023 07:01 PM    TOTBILI 0.8 10/22/2023 07:01 PM    GFR >60 11/14/2023 05:58 AM    GFRAA >60 02/22/2019 06:28 PM        Lab Results  Component Value Date    CHOL 216 (H) 10/17/2023    TRIG 219 (H) 10/17/2023    HDL 36 (L) 10/17/2023    LDL 161 (H) 10/17/2023    VLDL 43.8 10/17/2023 NONHDLCHOL 180 10/17/2023      TSH   Date Value Ref Range Status   06/05/2023 0.40 0.35 - 5.00 ?IU/mL Final     Microalbumin/CR ratio Urine   Date Value Ref Range Status   05/06/2017 285.85 (H) <30 ug/mg Final     Comment:     NOTE NEW REFERENCE RANGES           Glucose testing    Recent Labs     11/13/23  1247 11/13/23  1649 11/13/23  2005 11/14/23  0630 11/14/23  1230 11/14/23  1614 11/14/23  2041 11/15/23  0604   GLUPOC 189* 198* 221* 108* 149* 221* 119* 114*       Hemoglobin A1C   Date Value Ref Range Status   10/17/2023 11.7 (H) 4.0 - 5.7 % Final     Comment:     The ADA recommends that most patients with type 1 and type 2 diabetes maintain an A1c level <7%.           Anette CHRISTELLA Caldron, MD   [phone]

## 2023-11-15 NOTE — Progress Notes
 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  male admitted to [facility] on 10/28/2023 with the following issues: stroke      Rehabilitation Plan  Tentative discharge date: 11/19/2023  Rehabilitation: Patient will continue with comprehensive therapies at modified therapy schedule including physical therapy, occupational therapy, speech & language pathology, specialized rehab nursing, neuropsychology and physiatry oversight.    Goals:  Patient Will Perform Basic Cares With: Partial/moderate assistance, Progressing  Patient Will Perform Household Mobility With: Partial/moderate assistance, At wheelchair level  Recommended therapy after discharge: Consistent assistance, Home setting with home health, Versus, Skilled nursing facility  PT recommended equipment: Too early to determine (light weight versus custom wheelchair)  OT recommended equipment: Tub transfer bench    Daily Functional Update:  Stand to Sit Assist Level: Dependent (11/13/2023  2:45 PM)  Equipment: Hand hold assist (11/13/2023  2:45 PM)     Stand Pivot Assist Level: Dependent (11/13/2023  2:45 PM)  Equipment: Hand hold assist (11/13/2023  2:45 PM)     Slideboard No data recorded  No data recorded   Gait Distance: 120 feet, rest break, 180 feet (11/12/2023  9:45 AM)    Assist Level: Dependent (11/12/2023  9:45 AM)    Equipment: Specialty walker with harness (Rifton) (Added theraband behind thighs and tied to front of rifton to cue anterior propolsion of legs and improved upright posture) (11/12/2023  9:45 AM)     Wheelchair Distance 1: From room to gym (11/13/2023  1:45 PM)    Assist Level: Partial/moderate assistance (11/13/2023  1:45 PM)     Toileting       Assist Level: Dependent (11/13/2023  2:45 PM)  Equipment: -- Jayson Steady) (11/07/2023 10:10 AM)     Toilet Transfer No data recorded   Upper Body Dressing Assist Level: Substantial/maximal assistance (11/13/2023  2:45 PM)     Lower Body Dressing Assist Level: Substantial/maximal assistance (11/13/2023  2:45 PM)       Diet: PO: Ice chips only (11/07/2023  1:30 PM)      No data found.        Current Medical Problems/Risks of  Medical Complications/Management    Acute Right Pontomedullary Ischemic Stroke  Proximal Right Intracranial Vertebral Artery Occlusion  Diffuse Intracranial Atherosclerosis  Bilateral Horizontal Gaze Palsy  History of Forced Left Gaze Deviation  Left Facial Palsy  Impairments: left hemiparesis, expressive aphasia, oropharyngeal dysphagia, right facial weakness, left facial droop with dysarthria, diplopia, concern for attention/cognitive impairments, LUE ataxia, neurogenic bowel, neurogenic bladder  Impaired mobility and ADL, impaired swallow and cognition/communication               > consult PT/OT/SLP and neuropsychology to evaluate and treat  > Agitation prevention: Metoprolol switched to propranolol 10MG  bid.  Working on improved sleep/wake cycle with adjustments to modafinil (increase AM dose to 200mg  at 0530, 100mg  1200 6/6) and Remeron.  > Agitation abortive: Trazodone 12.5 mg as needed.  If second line is needed, will reach out to psychiatry regarding secondary agent such as Seroquel or increasing trazodone as his Qtc has improved on 6/5 EKG.                   > Sleep optimization: melatonin 5mg  qhs, previously on seroquel nightly               > follow up: Neurology, PM&R, Cardiology     Secondary Stroke Risk Optimization  > Antiplatelet/Anticoagulation: DAPT with aspirin 81mg  daily and clopidogrel 75mg  daily for 90 days followed by aspirin 81mg  indefinitely  > Blood Pressure Goal: <160 during acute hospitalization, long term goal of < 130/80               - Current antihypertensive medications: amlodipine 5mg  daily, lisinopril 20mg  daily, propranolol 10mg  BID (started 6/5)  > Lipids: LDL is 161, with goal of <70: rosuvastatin 40mg  daily (per Cardiology recs)  > Blood Glucose: Hgb A1c is 11.7%, with goal of <7  > Tobacco abstinence goal     Bladder Management  Neurogenic Bladder with Retention       Urinary tract infection  -Post-Void (PVR) Bladder Scan (mL): 250 milliliters (11/09/2023  6:35 PM)  -Straight Cath (mL): 525 (11/15/2023  3:55 AM)  - One episode of self-void 5/28; doxazosin increased to 3mg   - s/p bactrim 160mg  bid 5/31-6/1 - urine cx +e. Coli, sensitive to bactrim  > Monitor urinary retention per unit protocol. If unable to void or PVRs >300cc perform clean ISC (intermittent straight catheterization) for volumes > . Stop checking BVIs if 3 consecutive volumes <150cc.   > Discuss foley placement vs ISC following treatment of UTI / improvement in mood and participation (pt was noted to be pulling on PEG tube, will not place foley at this time in case he does so with foley catheter)  > Doxazosin 4mg  QHS (last increase 6/3)    Suicidal Ideation  Pseudobulbar Affect  Depression  - family denies knowledge of previous or current SI  - Per Neurology: Consider starting Dextromethorphan-Quinidine in the outpatient setting (unfortunately unavailble on the formulary inpatient) for Pseudobulbar affect, will start Fluoxetine 20mg  in the meantime --> instead was started on Sertraline 50mg , will continue while in rehab  - Per neuropsychology eval on 5/28: Pt?s reported mood was sad. Pt endorsed sadness and passive SI and also made a gesture of shooting himself. He reported that the last time he thought about it was yesterday.    > neuropsychology and psychiatry following   > Neurology clinic follow up on dc  > Appreciate Psychiatry Recommendations-    -- EKG 6/5- Qtc <470.   -- Continue Mirtazapine  7.5mg  QHS for mood, sleep, and appetite   -- Trazodone 12.5mg  BID PRN for agitation    -- Pt continues to endorse SI with plan  - Continue CO     Dysphagia s/p PEG tube placement  Moderate Malnutrition  - PEG tube placement 5/15               > Dietician consult, appreciate tube feed recommendations- transition from continuous to bolus feeds 5/19. Appreciate most updated nutrition recommendations, 5/27:  > ADAT to least restrictive per SLP; currently strict NPO with ice chips only when brightly alert   > EN Recs: Bolus Feeds of Jevity 1.5 x5 cartons daily; administered as 1 + 1/4 cartons ( ) QID  Provides 1778kcal, 76g protein, 901mL free fluids  Recommend at minimum water flushes of 30mL before and after each bolus feed for tube patency.   Additional fluids per primary team, with consideration for water boluses of 100mL before and after bolus feeds (provides additional 800mL daily for total fluids of 1718ml/d)  > Per videoswallow study 5/29- silently aspirated thin liquids and penetrated mildly thick liquids, too much oral weakness/uncoordination to consider dysphagia diet safely at this point; continue NPO at this time, continue to encourage ice chips throughout the day under supervision    Left Bell's Palsy  Discharge of the left eye - Improved  Left TMJ pain - Resolved  Ear Wax B/L ear - Improved  - s/p 1 week debrox               > Alternate eye patches; eyepatch for the L eye overnight, artificial tears TID daily in the setting of left facial palsy- increase drops to the left eye in the setting of L eye discharge. Monitor for ssx of infection to the left eye and consider erythromycin ointment if infection suspected   > lidocaine patch and tylenol to be continued as pt reports these improve left sided jaw pain    Oral Thrush               > oral care TID   > Nystatin swish and spit started 5/20, ok'd by SLP     Hyponatremia - Stable  Hyperkalemia - Resolved  - K 6/4 5.3  - Na 6/4 131  - s/p lokelma most recently 6/4  - serum osm WNL  - TSH and LFTs 6/6 WNL   > monitor intermittent BMP for improvement in Na   > if hyponatremia worsens, consider IM consult    Daytime Sleepiness - Improved  Fatigue - Improved  Increased Somnolence - Resolved  - Concern for increased somnolence 5/20 leading to Neurology Consult- rec CTA H&N; discussed with team may attempt to liberalize BP I.e. decreasing amlodipine to 5mg  due to symptoms possibly 2/2 to hypoperfusion  - CTA Head and Neck - negative for acute change  - Infectious workup including UA, CXR- negative for acute concerns  > appreciate further neurology recommendations- if acute focal weakness, numbness, coma, etc. Stroke activate/send to ED as he is high risk to have progression of his stroke/basilar involvement. Signed off.   > monitor on modafinil BID - ongoing adjustments as noted above to optimize alertness and participation though seems to be largely psych-related. Trial of modafinil 200mg  in the AM and 100mg  midday 6/6.    Bowel Management  At risk for Neurogenic Bowel  Loose stools   Constipation  -Last Bowel Movement Date: 11/14/23 (11/14/2023 11:35 PM)  - Goal for a daily BM or every  24-48 hours.   - c diff negative 5/18  > adjust bowel regimen PRN   > loperamide PRN  > discontinue senna and miralax 6/6 for loose stools     Hypertension  Orthostatic Hypotension  LVOT Obstruction  PFO  CAD s/p PCI               > metoprolol 25mg  BID - change to propranolol 10mg  BID 6/5               > amlodipine 5mg  daily, lisinopril 20mg  daily               > 30 day MCOT    > compression stockings to prevent sx orthostatic hypotension               > Follow up with outpatient Cardiology      Diabetes Mellitus               > Endocrinology consult for ongoing discharge regimen planning    Aspiration Pneumonia - Resolved  S/p AHRF  - increased O2 req overnight 5/13 with elevated procal, tachycardia, leukocytosis  - CXR 5/13 notable for mild basal opacities, favored to represent atelectasis  - s/p vanc x1 and zosyn x3 days  - sputum cx NGTD  - Augmentin to complete total 7 day course of antibiotics through 5/20 AM    Nutrition:   Body mass index is 24.21 kg/m?SABRA  Malnutrition Details:  Malnutrition present on admission  ICD-10 code E44: Chronic illness/Moderate non-severe malnutrition      Mild loss of muscle mass, Mild loss of body fat, Energy intake: Less than 75% of estimated energy requirement for 1 month or more        Loss of Subcutaneous Fat: Yes Mild Orbital, Triceps  Muscle Wasting: Yes Moderate Clavicle, Temple, Deltoid               Malnutrition Interventions: EN recs to meet 100% estimated kcal and protein needs    Wounds:          DVT ppx: enoxaparin (LOVENOX) syringe 40 mg  QDAY(21); patient will not require at the time of discharge    Code Status: Full Code    Current Medications:  Scheduled Meds:acetaminophen (TYLENOL) tablet 650 mg, 650 mg, Feeding Tube, Q6H*  amLODIPine (NORVASC) tablet 5 mg, 5 mg, Per NG tube, QDAY  artificial tears (PF) single dose ophthalmic solution 2 drop, 2 drop, Both Eyes, TID  aspirin chewable tablet 81 mg, 81 mg, Per NG tube, QDAY  clopiDOGreL (PLAVIX) tablet 75 mg, 75 mg, Per NG tube, QDAY  diclofenac sodium (VOLTAREN) 1 % topical gel 2 g, 2 g, Topical, QID  Diet Enteral Feeding Bolus, , SEE ADMIN INSTRUCTIONS, QID (3,87,83,79)   And  Water Bolus, 100 mL, PEG Tube, QID (3,87,83,79)  doxazosin (CARDURA) tablet 4 mg, 4 mg, Feeding Tube, QHS  enoxaparin (LOVENOX) syringe 40 mg, 40 mg, Subcutaneous, QDAY(21)  insulin aspart (U-100) (NOVOLOG FLEXPEN U-100 INSULIN) injection PEN 0-12 Units, 0-12 Units, Subcutaneous, QID  insulin aspart (U-100) (NOVOLOG FLEXPEN U-100 INSULIN) injection PEN 17 Units, 17 Units, Subcutaneous, QID  insulin glargine (LANTUS SOLOSTAR U-100 INSULIN) injection PEN 16 Units, 16 Units, Subcutaneous, QDAY  lidocaine (LIDODERM) 5 % topical patch 1 patch, 1 patch, Topical, QDAY  lisinopriL (ZESTRIL) tablet 20 mg, 20 mg, Feeding Tube, QDAY  melatonin tablet 5 mg, 5 mg, Feeding Tube, QHS  mirtazapine (REMERON) tablet 7.5 mg, 7.5 mg, Oral, QHS  modafiniL (PROVIGIL) tablet 200  mg, 200 mg, Oral, QDAY   And  modafiniL (PROVIGIL) tablet 100 mg, 100 mg, Oral, QDAY  nystatin (MYCOSTATIN) oral suspension 500,000 Units, 500,000 Units, Swish & Spit, QID  petrolatum (STYE) ophthalmic ointment 0.25 inch, 0.25 inch, Both Eyes, TID  polyethylene glycol 3350 (MIRALAX) packet 17 g, 1 packet, Per G Tube, QDAY  propranoloL (INDERAL) tablet 10 mg, 10 mg, Per NG tube, BID  rosuvastatin (CRESTOR) tablet 40 mg, 40 mg, Feeding Tube, QDAY  senna (SENOKOT) tablet 2 tablet, 2 tablet, Per G Tube, QHS  thiamine mononitrate (vit B1) tablet 100 mg, 100 mg, Per NG tube, QDAY    Continuous Infusions:  PRN and Respiratory Meds:bisacodyL QDAY PRN, dextrose 50% (D50) IV PRN, diclofenac sodium QID PRN, lidocaine hcl PRN, loperamide TID PRN, oxyCODONE Q12H PRN, pancrelipase 20,880 Units/sodium bicarbonate 650 mg ([place] CLOG DESTROYER) PRN (On Call from Rx), phenoL PRN, trazodone(#) BID PRN      Subjective     Seen at bedside this AM. Alert, interactive, pleasant. Shakes hands with physician team. Nods his head yes to being able to sleep overnight well. He denies any pain. Regarding bedside frog, able to state his daughter gave him this. He is receptive to physical examination and answering orientation questions. Follows simple commands with moving extremities. Oriented to person, does answer year and place correctly if given 2 choices.     Objective                          Vital Signs: Last Filed                 Vital Signs: 24 Hour Range   BP: 107/52 (06/06 0408)  Temp: 36.3 ?C (97.3 ?F) (06/06 0408)  Pulse: 70 (06/06 0408)  Respirations: 18 PER MINUTE (06/06 0408)  SpO2: 98 % (06/06 0408)  O2 Device: None (Room air) (06/06 0408) BP: (107-162)/(40-67)   Temp:  [36.3 ?C (97.3 ?F)-36.9 ?C (98.4 ?F)]   Pulse:  [70-82]   Respirations:  [18 PER MINUTE-20 PER MINUTE]   SpO2:  [97 %-98 %]   O2 Device: None (Room air)     Vitals:    10/30/23 1511 11/04/23 0401 11/11/23 0638   Weight: 59.4 kg (131 lb) 63.7 kg (140 lb 6.9 oz) 66 kg (145 lb 8.1 oz)       Intake/Output Summary:  (Last 24 hours)    Intake/Output Summary (Last 24 hours) at 11/15/2023 0806  Last data filed at 11/15/2023 9394  Gross per 24 hour   Intake 2218 ml   Output 2125 ml   Net 93 ml        Last Bowel Movement Date: 11/14/23    Labs--reviewed  Results for orders placed or performed during the hospital encounter of 10/28/23 (from the past 24 hours)   POC GLUCOSE    Collection Time: 11/14/23 12:30 PM   # # Low-High    Glucose, POC 149 (H) 70 - 100 mg/dL   POC GLUCOSE    Collection Time: 11/14/23  4:14 PM   # # Low-High    Glucose, POC 221 (H) 70 - 100 mg/dL   POC GLUCOSE    Collection Time: 11/14/23  8:41 PM   # # Low-High    Glucose, POC 119 (H) 70 - 100 mg/dL   POC GLUCOSE    Collection Time: 11/15/23  6:04 AM   # # Low-High    Glucose, POC 114 (H) 70 - 100  mg/dL   CBC CELLULAR THERAPEUTICS    Collection Time: 11/15/23  6:30 AM   # # Sharleen    White Blood Cells 8.10 4.50 - 11.00 10*3/uL    Red Blood Cells 3.97 (L) 4.40 - 5.50 10*6/uL    Hemoglobin 12.0 (L) 13.5 - 16.5 g/dL    Hematocrit 66.2 (L) 40.0 - 50.0 %    MCV 84.9 80.0 - 100.0 fL    MCH 30.2 26.0 - 34.0 pg    MCHC 35.6 32.0 - 36.0 g/dL    RDW 87.1 88.9 - 84.9 %    Platelet Count 286 150 - 400 10*3/uL    MPV 8.6 7.0 - 11.0 fL         Physical Exam  General: No acute distress, alert elderly male lying in bed   HEENT: NCAT, conjunctivae clear- minimal beige colored buildup of the medial left eye  Heart: Extremities warm and well perfused  Lungs: Normal work of breathing on room air  Abdomen: +Gtube, abdomen soft, non tender to palpation   Extremities: Peripheral edema is not present  Psych: pleasant and interactive this AM  Neuro: Alert, oriented to person [place], requires 2 choices to answer year 2025 vs [address] hospital vs shopping mall. Follows simple commands including motor exam notable for at least antigravity hip flexion, knee extension, shoulder abduction.  Noted ongoing left facial paralyis and bilateral horizontal gaze palsy. Reaches for hand for handshake at beginning and end of visit.     Labs & Therapy Notes Reviewed    Corean CHRISTELLA Brewster, DO   Physical Medicine & Rehabilitation  For patient care questions contact Rehab Medicine 3 FIRST CALL via Voalte.

## 2023-11-15 NOTE — Progress Notes
 Acute Inpatient Rehabilitation Behavior Summary of Shift    Name: Mose Colaizzi   MRN: [id number]     DOB: 03/21/1951      Age: 73 y.o.  Admission Date: 10/28/2023     LOS: 18 days     Date of Service: 11/15/2023      Reason for increased observation? Impulsivity and lack of safety awareness.   One-on-one safety watch: Yes  Q15-30 minute safety watch: No  Suicide precaution: No  Telemonitoring: No    Behavior outbursts during this shift? Yes  Description and time(s) of event: pt tried to hit staff for waking him up in the morning. He was agitated and aggressive.    Agitation during this shift? Yes  Description and time(s) of event: when being woken up and when trying to give eye drops/ stye cream, and when trying to teach the patient about straight cath refusal risks.      Active suicide ideation during this shift? No   Description and time(s) of event: N/A    Poor safety awareness or impulsivity during this shift? Yes  Description and time(s) of event: trying to get out of bed in the evening.    Noteworthy visitor interactions during this shift? N/A  Sleep/rest patterns observed during this shift? Hard to wake up in the morning, deep sleep.   Therapeutic interventions:    Distraction, Music, Redirection, and Reorientation  Additional Comments: N/A

## 2023-11-15 NOTE — Progress Notes
 SPEECH-LANGUAGE PATHOLOGY  DAILY TREATMENT NOTE         11/15/23 1045   Behavior   Comments* Pt sitting upright in bed and participating in therapy given encouragement throughout session.   Dysphagia Goals   Therapy Activities PO trials;Swallow exercises   Therapy Activity Comments Pt was given trials of thin liquids by spoon, mildly thick liquids by spoon and pureed solids. Pt demo'd functional oral stage w/all consistencies and no overt s/s aspiration w/any consistency. Pt was given trials x5 of each consistency.      Orientation Goals   Therapy Activities Orientation log score   Orientation Log Score (Out of 30) 6/30   Therapy Activity Comments Attempted to orient pt to environmental cues before and throughout administration of O-log, including use the clock on the wall, ipad for time, and other various cues. Pt had difficulty stating his DOB, as well as city, name of hospital, date and day of the week. Pt was asked various questions re: general and biographical info. Pt initially said he had two daughters, however w/further prompting, he was able to state he had three daughters. Pt perseverated on asking were Lola was throughout session. Pt's CO reported to him that she would be here later today.      Attention Goals   Therapy Activities Sustained attention   Therapy Activity Comments Pt was asked to match similar objects. Pt demo'd poor performance in matching similar objects, often forgetting to match the object based on color AND shape. Pt required cueing throughout task to achieve 60% accy.     Visual Scanning Goals   Therapy Activities Scanning and identifying objects in immediate environment;Activities   Therapy Activity Comments Pt was asked to scan the ipad to find matching shapes. Pt had difficulty finding the matching shape. Transitioned to placing items throughout the room and asking pt to locate them. Pt had a difficult time locating items in his room, such as various toiletries and his guitar. Pt required max cueing to complete this task.         Assessment Improved progressing towards goals today compared to previous session as he was more alert and willing to participate.     Plan Cognition:  -- O-log  -- Simple attention/memory  -- Visual scanning  -- Medication management  -- Finance management  -- Consider WAB-Bedside     Dysphagia:  -- PO trials thin/mildly thick liquids, pureed solids  -- EMST-Lite  -- Swallow exercises as able (effortful swallow, masako, lingual protrusion/holds)   -- Continue IOPI as able         Therapist: Pleasant Finer, M.Ed., L/CCC-SLP  Date: 11/15/2023

## 2023-11-15 NOTE — Progress Notes
 CLINICAL NUTRITION                                                        Clinical Nutrition Follow-Up Assessment     Name: Kevin Obrien   MRN: [id number]     DOB: August 01, 1950      Age: 73 y.o.  Admission Date: 10/28/2023     LOS: 18 days     Date of Service: 11/15/2023    Recommendation:  EN Recs: Bolus Feeds of Jevity 1.5/Isosource 1.5 x5 cartons daily; administered as 1.25 cartons (296mL) QID.   Provides 1778kcal, 76g protein, free fluids  Reduce free water flushes to minimal of 30mL pre/post feeds due to ongoing/worsening hyponatremia   Additional fluids per primary team, with consideration for water boluses of 100mL Before and after bolus feeds for hydration needs (total fluids from EN +minimal flushes 1152mL/day)   Additional fluids per primary team, with consideration for water boluses of 100mL before and after each feed for hydration needs (total fluids from EN + bolus water flushes 1739ml/d)    Added DM resources to dc paperwork     The nutrition-related order modifications are made in communication with the primary service, who remains responsible for the orders and overall care of the patient.    Comments:  Clinical Nutrition F/u re: EN tolerance and adequacy. Currently NPO with ice chips and bolus feeds of Jevity 1.5 x5 cartons daily +H2O flushes of 100ml pre/post bolus. 4 day EN average, meeting 100% of estimated protein and 100% of estimated kcal needs.  No noted GI distress of nausea, vomiting, constipation, or abdominal pain, however small, loose BMS persist ~4x/day. Nutrition related labs reviewed, of note: Na 132L; hypernatremia persists and worsening. Nutr related meds reviewed, of note: glargine 16 units SQ daily at noon+ Aspart 17 units SQ QID with bolus feeds + Aspart correction factor 4x daily prior to bolus feeds - mid dose; mirtazapine 7. D) inadequate oral intake persists, addressed. Altered nutr related labs - hyponatremia. I) Reduce water flushes to minimal. Defer to primary team for fluid mgt. M/E) Plan for ongoing monitoring/evaluation of: EN provision and tolerance, swallow function, weight trends, labs, GI function, clinical course, and GOC.    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      Mild loss of muscle mass, Mild loss of body fat, Energy intake: Less than 75% of estimated energy requirement for 1 month or more      Malnutrition Interventions: Continue to provide EN to meet 100% estimated protein and kcal needs    Nutrition Focused Physical Exam:  Loss of Subcutaneous Fat: Yes; Severity: Mild; Location: Orbital, Triceps  Muscle Wasting: Yes; Severity: Moderate; Location: Clavicle, Temple, Deltoid  Physical Assessment:  Ascites: No  Pressure Injury: none noted     Comment: lbm 6/5    Nutrition Diagnosis:  Inadequate oral intake persists, addressed   Altered nutrition-related laboratory values, specify: (Na)  Etiology: potenitally excess fluid provision  Signs & Symptoms: pt with Na consistently downtrending 132L today             Intervention / Plan:  Adjust free water flushes dt hyponatremia worsening  Add DM resources to dc paper work  Monitor/evaluation of: EN provision and adequacy, swallow ability, weight trends, labs, GI function, clinical course, and GOC.    [name], RDN, LD  Available on Voalte   [phone]

## 2023-11-15 NOTE — Progress Notes
 REC THERAPY DAILY NOTE    Name: Kevin Obrien   MRN: [id number]     DOB: [birth year]      Age: 73 y.o.  Admission Date: 10/28/2023     LOS: 18 days     Date of Service: 11/15/2023      Daily Note    Patient seen x1 this date.  Documentation reflects all daily treatment sessions.    Pt attended activity/group: Yes  Pt participated: Fully  Pt's interactions: Patient declined out of bed activity. Attempted to engage in listening to music or playing guitar. Patient declined and held hand up to gesture no. Utilized Research officer, [employer] for communication. Patient did not verbalize, only communicated through hand gestures. To avoid further agitating patient, session concluded. Patient remained in bed, assisted with turning on TV. CO present on departure. Provided handout about stroke resource guide at bedside for family.     Plan: Spend time outside, playing guitar     Therapist: Chiquita Emperor, [place]   Date: 11/15/2023

## 2023-11-15 NOTE — Progress Notes
 Patient/family declined:  Other: pt refusing straight cath until he feels he is ready, as well as eye drops and stye cream randomly.    Patient/family educated on importance of intervention to their safety/quality of care. Patient/family continues to decline care.    Reason Why Patient/Family declined:  Pt does not want it done until he feels his bladder is full enough. Pt does not like the eye medication because it hurts.     Individualized safety and/or care plan implemented. If additional safety measures implemented, please list them.     Escalated to:  Unit coordinator/charge nurse.

## 2023-11-15 NOTE — Progress Notes
 PHYSICAL THERAPY  NOTE      Name: Kevin Obrien   MRN: [id number]     DOB: 1950-10-03      Age: 73 y.o.  Admission Date: 10/28/2023     LOS: 18 days     Date of Service: 11/15/2023         11/15/23 1430   Type of Note   Type of Note Daily   Time Calculation   Start Time 1430   Stop Time 1530   Time Calculation 60   $$ Gait / Mobility 1 unit    $$ Therapeutic Exercise  1 unit    $$ Wh/chr Mngt/Mob 2 units    Subjective   Subjective Patient sitting in wheelchair before and after session. Agreeable to PT. Denies pain. In room at end of session, agreeable to sit up for 30 min before timed void, CO present.   Pain   Pain Scale No Pain   Precautions   Comments L sided weakness, visual impairment (horizontal gaze palsy), PEG   Sit to and from Stand   Sit to and from Stand Sit to stand;Stand to sit   Sit to Stand   Patient Needs Assistance With Extra time to complete activity;Facilitation of trunk;Balance;Facilitation of hip extension   Assist Level Partial/moderate assistance   Equipment Hand hold assist   Stand to Sit   Patient Needs Assistance With Facilitation of weight shift;Balance;Extra time to complete activity;Financial planner;Facilitation of hip flexion;Facilitation of trunk;Cues for positioning of hands   Assist Level Partial/moderate assistance   Equipment Hand hold assist   Stand Pivot Transfer   Patient Needs Assistance With Extra time to complete activity;Financial planner;Facilitation of hip flexion;Facilitation of trunk;Facilitation of weight shift;Cues for positioning of hands;Cues for sequencing;Balance;Facilitation of hip extension   Assist Level Partial/moderate assistance   Equipment Hand hold assist   Comments wheelchair<>nustep   Gait Distance 1   Distance 20 + 40   Patient Needs Assistance With Facilitation of weight shift;Cues for step length;Balance;Facilitation of trunk   Assist Level Dependent   Equipment Body weight supported   Patterns Decreased gait velocity;Narrow base of support;Shuffling;Trunk lean forward   Deviation Left Decreased hip extension;Decreased knee flexion in swing;Decreased stride length;No heel strike/foot flat;Left scissoring   Deviation Right Decreased hip extension;Decreased knee flexion in swing;Decreased stride length;No heel strike/foot flat;Right scissoring   Activity Limited By Weakness   Comments mod to max A of PT, two chairs set out for safety   Wheelchair Distance 1   Distance 1 200   Type Ultralight wheelchair   Patient Needs Assistance With Turns;Path navigation;Propulsion   Assist Level Partial/moderate assistance   Propulsion Method Bilateral lower extremity   Comments multiple short breaks required, improved motivation with PT propelling on stool racing   Therapeutic Interventions   NuStep 4-6 Minutes;Level 1  (using BUE and BLE to propel)   Assessment   Assessment Patient with improved participation this afternoon. Continues to require mod A for transfers and dependent for short gait distances. Did increase total wheelchair propulsion distance and anticipate patient will continue to benefit from ultralight wheelchair for efficiency. Patient enjoyed nustep, listening to the Beatles to improve participation.   Plan   PT Plan gait training Ax2 (hand hold assist, vector to help with posture vs rifton), neurogym for higher reps (consider using mirror for visual feedback), standing/reaching activities to promote upright posture, trial stairs as appropriate, nustep for conditioning, outcome measures as appropriate, continued wc ultralight propulsion; standing at plinth activities  Recommendations   PT Discharge Recommendations Consistent assistance;Home setting with home health;Versus;Skilled nursing facility   PT Equipment Recommendations Too early to determine  (light weight versus custom wheelchair)         Therapist: Tereasa Free, PT, DPT  Date: 11/15/2023

## 2023-11-16 LAB — POC GLUCOSE
~~LOC~~ BKR POC GLUCOSE: 172 mg/dL — ABNORMAL HIGH (ref 70–100)
~~LOC~~ BKR POC GLUCOSE: 106 mg/dL — ABNORMAL HIGH (ref 70–100)
~~LOC~~ BKR POC GLUCOSE: 174 mg/dL — ABNORMAL HIGH (ref 70–100)
~~LOC~~ BKR POC GLUCOSE: 178 mg/dL — ABNORMAL HIGH (ref 70–100)

## 2023-11-16 MED ORDER — SODIUM ZIRCONIUM CYCLOSILICATE 10 GRAM PO PWPK
10 g | Freq: Once | ORAL | 0 refills | Status: CP
Start: 2023-11-16 — End: ?
  Administered 2023-11-16: 14:00:00 10 g via ORAL

## 2023-11-16 NOTE — Progress Notes
 Acute Inpatient Rehabilitation Behavior Summary of Shift    Name: Kevin Obrien   MRN: [id number]     DOB: July 23, 1950      Age: 73 y.o.  Admission Date: 10/28/2023     LOS: 19 days     Date of Service: 11/16/2023      Reason for increased observation? Impulsivity; poor safety awareness  One-on-one safety watch: Yes  Q15-30 minute safety watch: No  Suicide precaution: No  Telemonitoring: No    Behavior outbursts during this shift? No  Description and time(s) of event:    Agitation during this shift? Yes  Description and time(s) of event: some agitation with morning tube feed, just trying to swat away the syringe      Active suicide ideation during this shift? No   Description and time(s) of event:     Poor safety awareness or impulsivity during this shift? Yes  Description and time(s) of event: while changing patient of incontinent bm's pt does attempt to place hand in groin area and requires redirection    Noteworthy visitor interactions during this shift? Daughters and wife at bedside until ten and then just 1 daughter at bedside the rest of the night  Sleep/rest patterns observed during this shift? Patient slept well all night   Therapeutic interventions:    Redirection, Reorientation, and Stop activity  Additional Comments:

## 2023-11-16 NOTE — Progress Notes
 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  male admitted to [facility] on 10/28/2023 with the following issues: stroke      Rehabilitation Plan  Tentative discharge date: 11/19/2023  Rehabilitation: Patient will continue with comprehensive therapies at modified therapy schedule including physical therapy, occupational therapy, speech & language pathology, specialized rehab nursing, neuropsychology and physiatry oversight.    Goals:  Patient Will Perform Basic Cares With: Partial/moderate assistance, Progressing  Patient Will Perform Household Mobility With: Partial/moderate assistance, At wheelchair level  Recommended therapy after discharge: Consistent assistance, Home setting with home health, Versus, Skilled nursing facility  PT recommended equipment: Too early to determine (light weight versus custom wheelchair)  OT recommended equipment: Tub transfer bench    Daily Functional Update:  Stand to Sit Assist Level: Partial/moderate assistance (11/15/2023  2:30 PM)  Equipment: Hand hold assist (11/15/2023  2:30 PM)     Stand Pivot Assist Level: Partial/moderate assistance (11/15/2023  2:30 PM)  Equipment: Hand hold assist (11/15/2023  2:30 PM)     Slideboard No data recorded  No data recorded   Gait Distance: 20 + 40 (11/15/2023  2:30 PM)    Assist Level: Dependent (11/15/2023  2:30 PM)    Equipment: Body weight supported (11/15/2023  2:30 PM)     Wheelchair Distance 1: 200 (11/15/2023  2:30 PM)    Assist Level: Partial/moderate assistance (11/15/2023  2:30 PM)     Toileting       Assist Level: Dependent (11/13/2023  2:45 PM)  Equipment: -- Jayson Steady) (11/07/2023 10:10 AM)     Toilet Transfer No data recorded   Upper Body Dressing Assist Level: Substantial/maximal assistance (11/13/2023  2:45 PM)     Lower Body Dressing Assist Level: Substantial/maximal assistance (11/15/2023  2:00 PM)       Diet: PO: Ice chips only (11/07/2023  1:30 PM)      Orientation Log & Cognitive Log Scores for the past 72 hrs (Last 3 readings):   Orientation Log Score (Out of 30)   11/15/23 1045 6/30           Current Medical Problems/Risks  of Medical Complications/Management    Acute Right Pontomedullary Ischemic Stroke  Proximal Right Intracranial Vertebral Artery Occlusion  Diffuse Intracranial Atherosclerosis  Bilateral Horizontal Gaze Palsy  History of Forced Left Gaze Deviation  Left Facial Palsy  Impairments: left hemiparesis, expressive aphasia, oropharyngeal dysphagia, right facial weakness, left facial droop with dysarthria, diplopia, concern for attention/cognitive impairments, LUE ataxia, neurogenic bowel, neurogenic bladder  Impaired mobility and ADL, impaired swallow and cognition/communication               > consult PT/OT/SLP and neuropsychology to evaluate and treat  > Agitation prevention: Metoprolol switched to propranolol 10MG  bid.  Working on improved sleep/wake cycle with adjustments to modafinil (increase AM dose to 200mg  at 0530, 100mg  1200 6/6) and Remeron.  > Agitation abortive: Trazodone 12.5 mg as needed.  If second line is needed, will reach out to psychiatry regarding secondary agent such as Seroquel or increasing trazodone as his Qtc has improved on 6/5 EKG.                   > Sleep optimization: melatonin 5mg  qhs, previously on seroquel nightly               > follow up: Neurology, PM&R, Cardiology     Secondary Stroke Risk Optimization  > Antiplatelet/Anticoagulation: DAPT with aspirin 81mg  daily and clopidogrel 75mg  daily for 90 days followed by aspirin 81mg  indefinitely  > Blood Pressure Goal: <160 during acute hospitalization, long term goal of < 130/80               - Current antihypertensive medications: amlodipine 5mg  daily, lisinopril 20mg  daily, propranolol 10mg  BID (started 6/5)  > Lipids: LDL is 161, with goal of <70: rosuvastatin 40mg  daily (per Cardiology recs)  > Blood Glucose: Hgb A1c is 11.7%, with goal of <7  > Tobacco abstinence goal     Bladder Management  Neurogenic Bladder with Retention       Urinary tract infection  -Post-Void (PVR) Bladder Scan (mL): 304 milliliters (11/15/2023  9:10 AM)  -Straight Cath (mL): 0 (11/16/2023  6:14 AM)  - One episode of self-void 5/28; doxazosin increased to 3mg   - s/p bactrim 160mg  bid 5/31-6/1 - urine cx +e. Coli, sensitive to bactrim  > Monitor urinary retention per unit protocol. If unable to void or PVRs >300cc perform clean ISC (intermittent straight catheterization) for volumes > . Stop checking BVIs if 3 consecutive volumes <150cc.   > Discuss foley placement vs ISC following treatment of UTI / improvement in mood and participation (pt was noted to be pulling on PEG tube, will not place foley at this time in case he does so with foley catheter)  > Doxazosin 4mg  QHS (last increase 6/3)    Suicidal Ideation  Pseudobulbar Affect  Depression  - family denies knowledge of previous or current SI  - Per Neurology: Consider starting Dextromethorphan-Quinidine in the outpatient setting (unfortunately unavailble on the formulary inpatient) for Pseudobulbar affect, will start Fluoxetine 20mg  in the meantime --> instead was started on Sertraline 50mg , will continue while in rehab  - Per neuropsychology eval on 5/28: Pt?s reported mood was sad. Pt endorsed sadness and passive SI and also made a gesture of shooting himself. He reported that the last time he thought about it was yesterday.    > neuropsychology and psychiatry following   > Neurology clinic follow up on dc  > Appreciate Psychiatry Recommendations-    -- EKG 6/5- Qtc <470.   -- Continue  Mirtazapine 7.5mg  QHS for mood, sleep, and appetite   -- Trazodone 12.5mg  BID PRN for agitation    -- Pt continues to endorse SI with plan  - Continue CO     Dysphagia s/p PEG tube placement  Moderate Malnutrition  - PEG tube placement 5/15               > Dietician consult, appreciate tube feed recommendations- transition from continuous to bolus feeds 5/19. Appreciate most updated nutrition recommendations, 5/27:  > ADAT to least restrictive per SLP; currently strict NPO with ice chips only when brightly alert   > EN Recs: Bolus Feeds of Jevity 1.5 x5 cartons daily; administered as 1 + 1/4 cartons ( ) QID  Provides 1778kcal, 76g protein, 901mL free fluids  Recommend at minimum water flushes of 30mL before and after each bolus feed for tube patency.   Additional fluids per primary team, with consideration for water boluses of 100mL before and after bolus feeds (provides additional 800mL daily for total fluids of 1775ml/d)  > Per videoswallow study 5/29- silently aspirated thin liquids and penetrated mildly thick liquids, too much oral weakness/uncoordination to consider dysphagia diet safely at this point; continue NPO at this time, continue to encourage ice chips throughout the day under supervision    Left Bell's Palsy  Discharge of the left eye - Improved  Left TMJ pain - Resolved  Ear Wax B/L ear - Improved  - s/p 1 week debrox               > Alternate eye patches; eyepatch for the L eye overnight, artificial tears TID daily in the setting of left facial palsy- increase drops to the left eye in the setting of L eye discharge. Monitor for ssx of infection to the left eye and consider erythromycin ointment if infection suspected   > lidocaine patch and tylenol to be continued as pt reports these improve left sided jaw pain    Oral Thrush               > oral care TID   > Nystatin swish and spit started 5/20, ok'd by SLP     Hyponatremia - Stable  Hyperkalemia - Resolved  - K 6/4 5.3  - Na 6/4 131  - s/p lokelma most recently 6/4  - serum osm WNL  - TSH and LFTs 6/6 WNL   > monitor intermittent BMP for improvement in Na   > if hyponatremia worsens, consider IM consult    Daytime Sleepiness - Improved  Fatigue - Improved  Increased Somnolence - Resolved  - Concern for increased somnolence 5/20 leading to Neurology Consult- rec CTA H&N; discussed with team may attempt to liberalize BP I.e. decreasing amlodipine to 5mg  due to symptoms possibly 2/2 to hypoperfusion  - CTA Head and Neck - negative for acute change  - Infectious workup including UA, CXR- negative for acute concerns  > appreciate further neurology recommendations- if acute focal weakness, numbness, coma, etc. Stroke activate/send to ED as he is high risk to have progression of his stroke/basilar involvement. Signed off.   > monitor on modafinil BID - ongoing adjustments as noted above to optimize alertness and participation though seems to be largely psych-related. Trial of modafinil 200mg  in the AM and 100mg  midday 6/6.    Bowel Management  At risk for Neurogenic Bowel  Loose stools   Constipation  -Last Bowel Movement Date: 11/16/23 (11/16/2023  6:14 AM)  - Goal for a daily BM  or every 24-48 hours.   - c diff negative 5/18  > adjust bowel regimen PRN   > loperamide PRN  > discontinue senna and miralax 6/6 for loose stools     Hypertension  Orthostatic Hypotension  LVOT Obstruction  PFO  CAD s/p PCI               > metoprolol 25mg  BID - change to propranolol 10mg  BID 6/5               > amlodipine 5mg  daily, lisinopril 20mg  daily               > 30 day MCOT    > compression stockings to prevent sx orthostatic hypotension               > Follow up with outpatient Cardiology      Diabetes Mellitus               > Endocrinology consult for ongoing discharge regimen planning    Aspiration Pneumonia - Resolved  S/p AHRF  - increased O2 req overnight 5/13 with elevated procal, tachycardia, leukocytosis  - CXR 5/13 notable for mild basal opacities, favored to represent atelectasis  - s/p vanc x1 and zosyn x3 days  - sputum cx NGTD  - Augmentin to complete total 7 day course of antibiotics through 5/20 AM    Nutrition:   Body mass index is 24.21 kg/m?SABRA  Malnutrition Details:  Malnutrition present on admission  ICD-10 code E44: Chronic illness/Moderate non-severe malnutrition      Mild loss of muscle mass, Mild loss of body fat, Energy intake: Less than 75% of estimated energy requirement for 1 month or more        Loss of Subcutaneous Fat: Yes Mild Orbital, Triceps  Muscle Wasting: Yes Moderate Clavicle, Temple, Deltoid               Malnutrition Interventions: Continue to provide EN to meet 100% estimated protein and kcal needs    Wounds:          DVT ppx: enoxaparin (LOVENOX) syringe 40 mg  QDAY(21); patient will not require at the time of discharge    Code Status: Full Code    Current Medications:  Scheduled Meds:acetaminophen (TYLENOL) tablet 650 mg, 650 mg, Feeding Tube, Q6H*  amLODIPine (NORVASC) tablet 5 mg, 5 mg, Per NG tube, QDAY  artificial tears (PF) single dose ophthalmic solution 2 drop, 2 drop, Both Eyes, TID  aspirin chewable tablet 81 mg, 81 mg, Per NG tube, QDAY  clopiDOGreL (PLAVIX) tablet 75 mg, 75 mg, Per NG tube, QDAY  diclofenac sodium (VOLTAREN) 1 % topical gel 2 g, 2 g, Topical, QID  Diet Enteral Feeding Bolus, , SEE ADMIN INSTRUCTIONS, QID (3,87,83,79)   And  Water Bolus, 30 mL, PEG Tube, QID (3,87,83,79)  doxazosin (CARDURA) tablet 4 mg, 4 mg, Feeding Tube, QHS  enoxaparin (LOVENOX) syringe 40 mg, 40 mg, Subcutaneous, QDAY(21)  insulin aspart (U-100) (NOVOLOG FLEXPEN U-100 INSULIN) injection PEN 0-12 Units, 0-12 Units, Subcutaneous, QID  insulin aspart (U-100) (NOVOLOG FLEXPEN U-100 INSULIN) injection PEN 17 Units, 17 Units, Subcutaneous, QID  insulin glargine (LANTUS SOLOSTAR U-100 INSULIN) injection PEN 16 Units, 16 Units, Subcutaneous, QDAY  lidocaine (LIDODERM) 5 % topical patch 1 patch, 1 patch, Topical, QDAY  lisinopriL (ZESTRIL) tablet 20 mg, 20 mg, Feeding Tube, QDAY  melatonin tablet 5 mg, 5 mg, Feeding Tube, QHS  mirtazapine (REMERON) tablet 7.5 mg, 7.5 mg, Oral, QHS  modafiniL (PROVIGIL) tablet 200 mg, 200 mg, Oral, QDAY   And  modafiniL (PROVIGIL) tablet 100 mg, 100 mg, Oral, QDAY  nystatin (MYCOSTATIN) oral suspension 500,000 Units, 500,000 Units, Swish & Spit, QID  petrolatum (STYE) ophthalmic ointment 0.25 inch, 0.25 inch, Both Eyes, TID  propranoloL (INDERAL) tablet 10 mg, 10 mg, Per NG tube, BID  rosuvastatin (CRESTOR) tablet 40 mg, 40 mg, Feeding Tube, QDAY  thiamine mononitrate (vit B1) tablet 100 mg, 100 mg, Per NG tube, QDAY    Continuous Infusions:  PRN and Respiratory Meds:bisacodyL QDAY PRN, dextrose 50% (D50) IV PRN, diclofenac sodium QID PRN, lidocaine hcl PRN, loperamide TID PRN, oxyCODONE Q12H PRN, pancrelipase 20,880 Units/sodium bicarbonate 650 mg ([place] CLOG DESTROYER) PRN (On Call from Rx), phenoL PRN, trazodone(#) BID PRN      Subjective     NAEON. No acute complaints or concerns in morning. Reports much improved sleep. Pain well controlled with multimodal regimen. Tolerating therapy activities well, behavior improved with adequate spacing between therapies. No new psych recs, tolerating modafinil well, will continue to monitor behavior. VS WNL, stable hyponatremia at 133 today. K 5.2 today, stable mild hyperkalemia. Will order once dose Lokelma. Continued loose BMs today, will continue to monitor as stool softeners were DCed yesterday.      Objective                          Vital Signs: Last Filed                 Vital Signs: 24 Hour Range   BP: 134/49 (06/07 0450)  Temp: 36.7 ?C (98 ?F) (06/07 0450)  Pulse: 64 (06/07 0450)  Respirations: 16 PER MINUTE (06/07 0450)  SpO2: 94 % (06/07 0450)  O2 Device: None (Room air) (06/07 0450) BP: (134-147)/(49-63)   Temp:  [36.7 ?C (98 ?F)-36.8 ?C (98.2 ?F)]   Pulse:  [64-76]   Respirations:  [16 PER MINUTE-18 PER MINUTE]   SpO2:  [94 %-97 %]   O2 Device: None (Room air)     Vitals:    10/30/23 1511 11/04/23 0401 11/11/23 0638   Weight: 59.4 kg (131 lb) 63.7 kg (140 lb 6.9 oz) 66 kg (145 lb 8.1 oz)       Intake/Output Summary:  (Last 24 hours)    Intake/Output Summary (Last 24 hours) at 11/16/2023 0845  Last data filed at 11/16/2023 0615  Gross per 24 hour   Intake 832 ml   Output 1400 ml   Net -568 ml        Last Bowel Movement Date: 11/16/23    Labs--reviewed  Results for orders placed or performed during the hospital encounter of 10/28/23 (from the past 24 hours)   POC GLUCOSE    Collection Time: 11/15/23  1:13 PM   # # Low-High    Glucose, POC 139 (H) 70 - 100 mg/dL   POC GLUCOSE    Collection Time: 11/15/23  4:29 PM   # # Low-High    Glucose, POC 234 (H) 70 - 100 mg/dL   POC GLUCOSE    Collection Time: 11/15/23  8:54 PM   # # Low-High    Glucose, POC 172 (H) 70 - 100 mg/dL   POC GLUCOSE    Collection Time: 11/16/23  6:19 AM   # # Low-High    Glucose, POC 106 (H) 70 - 100 mg/dL   BASIC METABOLIC PANEL CELLULAR THERAPEUTICS    Collection Time: 11/16/23  6:47 AM   # # Low-High    Sodium 133 (L) 137 - 147 mmol/L    Potassium 5.2 (H) 3.5 - 5.1 mmol/L    Chloride 98 98 - 110 mmol/L    Glucose 112 (H) 70 - 100 mg/dL    Blood Urea Nitrogen 32 (H) 7 - 25 mg/dL    Creatinine 9.07 9.59 - 1.24 mg/dL    Calcium 89.9 8.5 - 89.3 mg/dL    CO2 25 21 - 30 mmol/L    Anion Gap 10 3 - 12    Glomerular Filtration Rate (GFR) >60 >60 mL/min         Physical Exam  General: No acute distress, alert elderly male lying in bed, family at bedside  HEENT: NCAT  Heart: Extremities warm and well perfused  Lungs: Normal work of breathing on room air  Abdomen: +Gtube, non-distended.   Extremities: Peripheral edema is not present  Psych: pleasant and interactive this AM  Neuro: Somnolent this morning.    Labs & Therapy Notes Reviewed    Ladonna Idell LULLA Orlan, MD   Physical Medicine & Rehabilitation  For patient care questions contact Rehab Medicine 3 FIRST CALL via Voalte.

## 2023-11-16 NOTE — Progress Notes
 PHYSICAL THERAPY       11/16/23 1430   Type of Note   Type of Note Daily   Time Calculation   Start Time 1411   Stop Time 1430   Time Calculation 19   Missed Minutes 21   Reasons for Missed Minutes Refused   Plan to Make Up Missed Minutes ST to make up minutes during today's session   $$ PT Therapeutic Activity 1 unit   History   Reason For Admission Presented to [facility] on 10/17/2023 with left gaze deviation, facial weakness and dysarthria.  CT head was negative for acute changes.  CTA head and neck without LVO.  Patient was not a TNK or EVT candidate. 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 Neurology recommend DAPT with aspirin and Plavix.  He is currently on permissive hypertension.   Previous Medical History Accidental fall, Angina pectoris, Arthritis, BPH (benign prostatic hyperplasia), Cataract, Constipation, Coronary artery disease, DM (diabetes mellitus) (CMS-HCC), ED (erectile dysfunction), Heart attack (CMS-HCC), Hyperlipidemia, Hypertension, Joint pain, Neuropathy, and Shingles.   Subjective   Subjective Patient supine upon arrival, agreeable to therapy session. Patient in rehab gym in wheelchair at end of session, ST present for scheduled treatment.   Pain   Pain Scale No Pain   Precautions   Comments L sided weakness, visual impairment (horizontal gaze palsy), PEG   Lying to Sitting on Side of Bed   Patient Needs Assistance With Trunk;Bilateral lower extremities;Extra time to complete the activity   Assist Level Substantial/maximal assistance   Equipment HOB elevated;With rails   Comments more assist required to transition edge of bed, patient with variable engagement to task noted   Sit to Stand   Patient Needs Assistance With Facilitation of trunk;Facilitation of weight shift;Balance   Assist Level Partial/moderate assistance   Equipment Hand hold assist   Comments moderate assistance   Stand to Sit   Patient Needs Assistance With Facilitation of weight shift;Balance;Extra time to complete activity;Financial planner;Facilitation of hip flexion;Facilitation of trunk;Cues for positioning of hands   Assist Level Partial/moderate assistance   Equipment Hand hold assist   Comments minimal assistance   Stand Pivot Transfer   Patient Needs Assistance With Extra time to complete activity;Financial planner;Facilitation of hip flexion;Facilitation of trunk;Facilitation of weight shift;Cues for positioning of hands;Cues for sequencing;Balance;Facilitation of hip extension   Assist Level Partial/moderate assistance   Equipment Hand hold assist   Comments bed to wheelchair transfer with minimal to moderate assistance.   Gait Distance 1   Distance 25   Patient Needs Assistance With Facilitation of weight shift;Cues for step length;Balance;Facilitation of trunk   Assist Level Dependent   Equipment Hand hold assist   Patterns Decreased gait velocity;Narrow base of support;Shuffling;Trunk lean forward   Deviation Left Decreased hip extension;Decreased knee flexion in swing;Decreased stride length;No heel strike/foot flat;Left scissoring   Deviation Right Decreased hip extension;Decreased knee flexion in swing;Decreased stride length;No heel strike/foot flat;Right scissoring   Activity Limited By Weakness;Complaint of fatigue   Comments moderate assistance x 2 persons for short distance walk in therapy gym. No evidence of knee buckling, small steps noted as distance progressed.   Wheelchair Distance 1   Distance 1 70   Type Ultralight wheelchair   Patient Needs Assistance With Turns;Path navigation;Propulsion   Assist Level Partial/moderate assistance   Propulsion Method Bilateral upper extremity   Comments minimal assist to close standby; able to maneuver in open spaces/hallway with increased time.   Assessment   Assessment Patient more motivated  to work with PT upon second attempt in afternoon. Variable interest/engagement to tasks noted despite PT attempt to find meaningful/salient tasks to complete.   Plan   PT Plan gait training Ax2 (hand hold assist, vector to help with posture vs rifton), neurogym for higher reps (consider using mirror for visual feedback), standing/reaching activities to promote upright posture, trial stairs as appropriate, nustep for conditioning, outcome measures as appropriate, continued wc ultralight propulsion; standing at plinth activities   Recommendations   PT Discharge Recommendations Consistent assistance;Home setting with home health;Versus;Skilled nursing facility   PT Equipment Recommendations Too early to determine  (lightweight vs custom wheelchair)     Cheron Plain, PT, DPT

## 2023-11-16 NOTE — Progress Notes
 SPEECH-LANGUAGE PATHOLOGY     11/16/23 1430   Behavior   Comments* Pt sitting in wheelchair in the rehab gym completing PT session upon arrival of SLP. Pt requested that session be done in the gym this date. Pt agreeable to scheduled therapy and participated with encouragement. Pt returned to room with call light in reach and all needs met. Pt's friend and nursing staff present upon completion of the session. Safety handoff completed in person. Additional minutes made up this session from PT session earlier this date.   Dysphagia Goals   Therapy Activities PO trials;Swallow exercises   Swallow exercises Effortful swallow   PO Trials Thin liquids;Mildly thick liquids   Therapy Activity Comments Pt was given trials of thin liquids by spoon and mildly thick liquids by spoon. Immediate cough in 1/10 trials of thin liquids and multiple swallows noted for all trials. Pt has hx of silent aspiration. Anterior bolus loss noted x7 trials.     Attempted to provide PO trials of mildly thick liquids this date. Pt initially agreeable however expectorated bolus when provided via spoon x3 despite prompting and encoragement. Pt did not provide response when asked for reasoning. Resumed trials of thin liquid.     Completed effortful swallow exercises x10 with mod cues and encouragement for completing exercises and clarifying directions.   Orientation Goals   Therapy Activities Orientation log score   Orientation Log Score (Out of 30) 5/30   Therapy Activity Comments -1 score from previous administration. Pt oriented to self independently. Pt required multiple choice cueing for city, kind of place, year, etiology, and pathology. Pt unable to state name of hospital, month, DoW or clock time despite cues.     Pt required encouragement to answer this date. SLP attempted to orient pt with external cues (clock, phone, outside window) however minimally effective. ? effort contributing factor on performance.       Assessment Pt noted to be minimally verbal this date however responded in repetition when given mod cues and encouragement   Plan Cognition:  -- O-log  -- Simple attention/memory  -- Visual scanning  -- Medication management  -- Finance management  -- Consider WAB-Bedside     Dysphagia:  -- PO trials thin/mildly thick liquids, pureed solids  -- EMST-Lite  -- Swallow exercises as able (effortful swallow, masako, lingual protrusion/holds)   -- Continue IOPI as able     [place] Teresia Myint, M.A., CCC-SLP

## 2023-11-16 NOTE — Progress Notes
 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)    Postherpetic neuralgia    Essential tremor    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    Poorly controlled diabetes mellitus (CMS-HCC)    Nephrotic range proteinuria    Ischemic stroke (CMS-HCC)    Moderate malnutrition    On enteral nutrition    Adjustment disorder with depressed mood    Expressive aphasia      Endocrine Problem List:  Type 2 Diabetes - uncontrolled   A1C:  11.7% on 5.8.2025   Diabetes provider:  PCP   PTA meds:  non-adherant  Diabetes complications to include:   CAD / CVA / neuropathy / nephropathy / retinopathy  On enteral feeds    Recommendations:  Glargine 16 units SQ daily at noon  Aspart 17 units SQ QID with bolus feeds  Aspart correction factor 4x daily prior to bolus feeds - mid dose  ________________________________________________________________________    HPI:  Kevin Obrien is a 73 y.o. male.      Chart reviewed including diabetes flow sheet and POC glucose values:    Glucose, POC   Date/Time Value Ref Range Status   11/16/2023 0619 106 (H) 70 - 100 mg/dL Final   93/93/7974 7945 172 (H) 70 - 100 mg/dL Final   93/93/7974 8370 234 (H) 70 - 100 mg/dL Final   93/93/7974 8686 139 (H) 70 - 100 mg/dL Final   93/93/7974 9395 114 (H) 70 - 100 mg/dL Final   93/94/7974 7958 119 (H) 70 - 100 mg/dL Final   93/94/7974 8385 221 (H) 70 - 100 mg/dL Final   93/94/7974 8769 149 (H) 70 - 100 mg/dL Final   93/94/7977 7993 158 (H) 70 - 100 MG/DL Final   93/94/7977 8081 257 (H) 70 - 100 MG/DL Final   93/94/7977 8242 397 (H) 70 - 100 MG/DL Final   89/88/7978 8567 105 (H) 70 - 100 MG/DL Final   90/85/7979 8280 129 (H) 70 - 100 MG/DL Final   90/85/7979 9340 126 (H) 70 - 100 MG/DL Final   90/85/7979 9655 182 (H) 70 - 100 MG/DL Final   88/78/7980 9048 266  Final       Diet:  NPO x/ ice chips  Nutrition:    Diet Enteral Feeding Bolus  (Enteral Feeding Bolus With Water Bolus)     Discontinue     Question Answer Comment   Enteral Substance: Jevity 1.5    Amount Per Bolus: 1.25cartons or    Feeding Route: PEG/Gastrostomy       And Linked Group Details      Steroid:  none    Review of systems:  Slept well per family report.     PTA DM Meds:  Non-adherent  to prandial insulin    Medications  Scheduled Meds:acetaminophen (TYLENOL) tablet 650 mg, 650 mg, Feeding Tube, Q6H*  amLODIPine (NORVASC) tablet 5 mg, 5 mg, Per NG tube, QDAY  artificial tears (PF) single dose ophthalmic solution 2 drop, 2 drop, Both Eyes, TID  aspirin chewable tablet 81 mg, 81 mg, Per NG tube, QDAY  clopiDOGreL (PLAVIX) tablet 75 mg, 75 mg, Per NG tube, QDAY  diclofenac sodium (VOLTAREN) 1 % topical gel 2 g, 2 g, Topical, QID  Diet Enteral Feeding Bolus, , SEE ADMIN INSTRUCTIONS, QID (3,87,83,79)   And  Water Bolus, 30 mL, PEG Tube, QID (3,87,83,79)  doxazosin (CARDURA) tablet 4 mg, 4 mg, Feeding Tube, QHS  enoxaparin (LOVENOX) syringe 40 mg, 40 mg, Subcutaneous, QDAY(21)  insulin aspart (U-100) (NOVOLOG FLEXPEN U-100 INSULIN) injection PEN 0-12 Units, 0-12 Units, Subcutaneous, QID  insulin aspart (U-100) (NOVOLOG FLEXPEN U-100 INSULIN) injection PEN 17 Units, 17 Units, Subcutaneous, QID  insulin glargine (LANTUS SOLOSTAR U-100 INSULIN) injection PEN 16 Units, 16 Units, Subcutaneous, QDAY  lidocaine (LIDODERM) 5 % topical patch 1 patch, 1 patch, Topical, QDAY  lisinopriL (ZESTRIL) tablet 20 mg, 20 mg, Feeding Tube, QDAY  melatonin tablet 5 mg, 5 mg, Feeding Tube, QHS  mirtazapine (REMERON) tablet 7.5 mg, 7.5 mg, Oral, QHS  modafiniL (PROVIGIL) tablet 200 mg, 200 mg, Oral, QDAY   And  modafiniL (PROVIGIL) tablet 100 mg, 100 mg, Oral, QDAY  nystatin (MYCOSTATIN) oral suspension 500,000 Units, 500,000 Units, Swish & Spit, QID  petrolatum (STYE) ophthalmic ointment 0.25 inch, 0.25 inch, Both Eyes, TID  propranoloL (INDERAL) tablet 10 mg, 10 mg, Per NG tube, BID  rosuvastatin (CRESTOR) tablet 40 mg, 40 mg, Feeding Tube, QDAY  thiamine mononitrate (vit B1) tablet 100 mg, 100 mg, Per NG tube, QDAY    Continuous Infusions:  PRN and Respiratory Meds:bisacodyL QDAY PRN, dextrose 50% (D50) IV PRN, diclofenac sodium QID PRN, lidocaine hcl PRN, loperamide TID PRN, oxyCODONE Q12H PRN, pancrelipase 20,880 Units/sodium bicarbonate 650 mg ([place] CLOG DESTROYER) PRN (On Call from Rx), phenoL PRN, trazodone(#) BID PRN             Objective                       Vital Signs: Last Filed                 Vital Signs: 24 Hour Range   BP: 134/49 (06/07 0450)  Temp: 36.7 ?C (98 ?F) (06/07 0450)  Pulse: 64 (06/07 0450)  Respirations: 16 PER MINUTE (06/07 0450)  SpO2: 94 % (06/07 0450)  O2 Device: None (Room air) (06/07 0450) BP: (134-147)/(49-63)   Temp:  [36.7 ?C (98 ?F)-36.8 ?C (98.2 ?F)]   Pulse:  [64-76]   Respirations:  [16 PER MINUTE-18 PER MINUTE]   SpO2:  [94 %-97 %]   O2 Device: None (Room air)     Vitals:    10/30/23 1511 11/04/23 0401 11/11/23 0638   Weight: 59.4 kg (131 lb) 63.7 kg (140 lb 6.9 oz) 66 kg (145 lb 8.1 oz)         Physical Exam  Deferred today        Lab Review  Comprehensive Metabolic Profile    Lab Results   Component Value Date/Time    NA 132 (L) 11/15/2023 06:30 AM    K 4.9 11/15/2023 06:30 AM    CL 99 11/15/2023 06:30 AM    CO2 24 11/15/2023 06:30 AM    GAP  9 11/15/2023 06:30 AM    BUN 26 (H) 11/15/2023 06:30 AM    CR 0.86 11/15/2023 06:30 AM    GLU 112 (H) 11/15/2023 06:30 AM    Lab Results   Component Value Date/Time    CA 9.3 11/15/2023 06:30 AM ALBUMIN 3.6 11/15/2023 06:30 AM    TOTPROT 6.8 11/15/2023 06:30 AM    ALKPHOS 77 11/15/2023 06:30 AM    AST 15 11/15/2023 06:30 AM    ALT 23 11/15/2023 06:30 AM    TOTBILI 0.3 11/15/2023 06:30 AM    GFR >60 11/15/2023 06:30 AM    GFRAA >60 02/22/2019 06:28 PM        Lab Results   Component Value Date    CHOL 216 (H) 10/17/2023    TRIG 219 (H) 10/17/2023    HDL 36 (L) 10/17/2023    LDL 161 (H) 10/17/2023    VLDL 43.8 10/17/2023    NONHDLCHOL 180 10/17/2023      TSH   Date Value Ref Range Status   11/15/2023 1.02 0.35 - 5.00 ?IU/mL Final     Microalbumin/CR ratio Urine   Date Value Ref Range Status   05/06/2017 285.85 (H) <30 ug/mg Final     Comment:     NOTE NEW REFERENCE RANGES           Glucose testing    Recent Labs     11/14/23  1230 11/14/23  1614 11/14/23  2041 11/15/23  0604 11/15/23  1313 11/15/23  1629 11/15/23  2054 11/16/23  0619   GLUPOC 149* 221* 119* 114* 139* 234* 172* 106*       Hemoglobin A1C   Date Value Ref Range Status   10/17/2023 11.7 (H) 4.0 - 5.7 % Final     Comment:     The ADA recommends that most patients with type 1 and type 2 diabetes maintain an A1c level <7%.           Anette CHRISTELLA Caldron, MD   [phone]

## 2023-11-16 NOTE — Progress Notes
 Acute Inpatient Rehabilitation Behavior Summary of Shift    Name: Kevin Obrien   MRN: [id number]     DOB: 11-16-1950      Age: 73 y.o.  Admission Date: 10/28/2023     LOS: 19 days     Date of Service: 11/16/2023      Reason for increased observation? Impulsive, poor safety awareness  One-on-one safety watch: Yes  Q15-30 minute safety watch: No  Suicide precaution: No  Telemonitoring: No    Behavior outbursts during this shift? Yes  Description and time(s) of event: crying throughout the day   Agitation during this shift? Yes  Description and time(s) of event: refusing afternoon therapy, saying no to everything after that and wanting us  out of his room.       Active suicide ideation during this shift? No   Description and time(s) of event: N/A    Poor safety awareness or impulsivity during this shift? Yes  Description and time(s) of event: Wanting to get out of bed    Noteworthy visitor interactions during this shift? N/A  Sleep/rest patterns observed during this shift? N/A  Therapeutic interventions:    Distraction, Redirection, and Reorientation  Additional Comments: N/A

## 2023-11-16 NOTE — Progress Notes
 OCCUPATIONAL THERAPY     11/16/23 0945   Type of Note   Type of Note Daily   Time Calculation   Start Time 0945   Stop Time 1045   Time Calculation 60   $$ Phys ADL Skills 4 units   Subjective   Subjective pt is agreeable to shower. requires rest break after showering and dressing prior to continuing session   Pain   Pain Scale No Pain   Cognitive   Orientation Place;Time;Situation   Patient Behavior Calm   Family Behavior Supportive   Cognition Decrease attention/ concentration;Poor Safety Awareness   Oral Hygiene   Assist Level Supervision or touching assistance   Position Sitting in chair   Comments pt seated in wheelchair and leaning heavily onto counter intermitently. [place] all aspects of task with increased time. increased time to locate items   Grooming   Patient Needs Assistance With Applying deodorant   Assist Level Setup or clean-up assistance   Position Sitting in chair   Bathing   Bath/Shower Soap and water shower/bath   Patient Needs Assistance With Buttocks;Left lower leg and foot;Right lower leg and foot;Standing balance   Assist Level Partial/moderate assistance   Position Standing for perineal hygiene only   Comments intermittent verbal cues throughout, assist for buttocks and BLE   Tub/Shower Transfer   Transfer Type Stand pivot   Assist Level Partial/moderate assistance   Equipment Grab bar   Comments cues to use grab bar, minimal assist for steadying and weight shift   Upper Body Dressing   Patient Needs Assistance With Shirt: threading or unthreading RUE;Shirt: threading or unthreading LUE;Shirt: threading or unthreading head;Shirt: pulling down or up over trunk   Assist Level Substantial/maximal assistance   Position Sitting edge of bed   Comments pt requires increased assist for dressing tasks this session due to wanting to lay down. daughter present and provides additional encouragement and assist   Lower Body Dressing   Patient Needs Assistance With Underwear: threading or unthreading RLE;Underwear: threading or unthreading LLE;Underwear: pulling up or down;Pants: threading or unthreading RLE;Pants: threading or unthreading LLE;Pants: pulling up or down   Assist Level Dependent   Comments assist for all aspects, pt is able to participate in pulling pants and underwear up in standing - daughter provides assist for thoroughness while therapist steadies   Footwear   Patient Needs Assistance With Socks: applying or removing right;Socks: applying or removing left   Assist Level Dependent   Position Sitting edge of bed   Daily Care   Urine Function Straight cath   Straight Cath (mL) 500   Urine Color Yellow   Urine Description Clear;Sediment   Urinary Catheter / Perineal Care Assist   Last Bowel Movement Date 11/16/23   Assessment   Assessment pt generally agreeable for ADL this date, benefitted from brief rest break   Plan   OT Plan LUE NMR, BUE coordination/strengthening, visual scanning, sitting/standing balance, ADL participation     [employer], OTR/L [phone]

## 2023-11-16 NOTE — Progress Notes
 PHYSICAL THERAPY       11/16/23 1320   Type of Note   Type of Note Daily   Time Calculation   Start Time 1300   Stop Time 1320   Time Calculation 20   Missed Minutes 40   Reasons for Missed Minutes Refused   Plan to Make Up Missed Minutes PT to try back; working with ST for additional minutes   $$ PT Therapeutic Activity 1 unit   Subjective   Subjective Patient supine upon arrival of PT and scheduled rehab technician, nursing staff present. Patient declining therapy at this time, stating NO to any intervention, activity, idea PT presented. Per nursing staff, patient very motivated earlier to go to the gym, unsure why declining. PT/rehab tech attempted to assist patient edge of bed, patient holding up hand in STOP gesture and continues to say NO. Nursing staff, bedside nurse tried to encourage patient, continued to decline and insist he is to wait for his brother [place]. PT called daughter [name] and put on speaker phone to try and encourage patient to work with therapy. Patient declined, continued to verbalize NO and desire to wait for brother. PT will continue to follow and provide intervention as patient agreeable. Bed alarm on, bedside nurse and nursing staff present at end of encounter.     Cheron Plain, PT, DPT

## 2023-11-17 ENCOUNTER — Inpatient Hospital Stay: Admit: 2023-11-17 | Discharge: 2023-11-17 | Payer: MEDICARE

## 2023-11-17 ENCOUNTER — Inpatient Hospital Stay: Admit: 2023-11-17 | Discharge: 2023-11-18 | Payer: MEDICARE

## 2023-11-17 DIAGNOSIS — I69354 Hemiplegia and hemiparesis following cerebral infarction affecting left non-dominant side: Secondary | ICD-10-CM

## 2023-11-17 LAB — URINALYSIS MICROSCOPIC REFLEX TO CULTURE
~~LOC~~ BKR RBC, UA: 2 /HPF — AB (ref 5.0–8.0)
~~LOC~~ BKR SQUAMOUS EPI CELLS: 0 /HPF

## 2023-11-17 LAB — CBC
~~LOC~~ BKR HEMATOCRIT: 36 % — ABNORMAL LOW (ref 40.0–50.0)
~~LOC~~ BKR HEMOGLOBIN: 12 g/dL — ABNORMAL LOW (ref 13.5–16.5)
~~LOC~~ BKR MCH: 30 pg (ref 26.0–34.0)
~~LOC~~ BKR MCHC: 35 g/dL (ref 32.0–36.0)
~~LOC~~ BKR MCV: 86 fL (ref 80.0–100.0)
~~LOC~~ BKR MPV: 8.2 fL (ref 7.0–11.0)
~~LOC~~ BKR PLATELET COUNT: 271 10*3/uL (ref 150–400)
~~LOC~~ BKR RBC COUNT: 4.1 10*6/uL — ABNORMAL LOW (ref 4.40–5.50)
~~LOC~~ BKR RDW: 12 % (ref 11.0–15.0)
~~LOC~~ BKR WBC COUNT: 8 10*3/uL (ref 4.50–11.00)

## 2023-11-17 LAB — POC GLUCOSE
~~LOC~~ BKR POC GLUCOSE: 123 mg/dL — ABNORMAL HIGH (ref 70–100)
~~LOC~~ BKR POC GLUCOSE: 140 mg/dL — ABNORMAL HIGH (ref 70–100)
~~LOC~~ BKR POC GLUCOSE: 154 mg/dL — ABNORMAL HIGH (ref 70–100)
~~LOC~~ BKR POC GLUCOSE: 178 mg/dL — ABNORMAL HIGH (ref 70–100)
~~LOC~~ BKR POC GLUCOSE: 182 mg/dL — ABNORMAL HIGH (ref 70–100)

## 2023-11-17 LAB — URINALYSIS DIPSTICK REFLEX TO CULTURE
~~LOC~~ BKR NITRITE: NEGATIVE
~~LOC~~ BKR URINE BLOOD: NEGATIVE

## 2023-11-17 MED ORDER — SULFAMETHOXAZOLE-TRIMETHOPRIM 800-160 MG PO TAB
1 | Freq: Two times a day (BID) | GASTROSTOMY | 0 refills | Status: DC
Start: 2023-11-17 — End: 2023-11-19
  Administered 2023-11-18 – 2023-11-19 (×3): 1 via GASTROSTOMY

## 2023-11-17 NOTE — Progress Notes
 Acute Inpatient Rehabilitation Behavior Summary of Shift    Name: Kevin Obrien   MRN: [id number]     DOB: [birth year]      Age: 73 y.o.  Admission Date: 10/28/2023     LOS: 20 days     Date of Service: 11/17/2023      Reason for increased observation? Poor safety awareness and impulsiveness  One-on-one safety watch: No  Q15-30 minute safety watch: No  Suicide precaution: No  Telemonitoring: Yes    Behavior outbursts during this shift? Yes  Description and time(s) of event: patient agitated and refusing all care. When I give him time to relax and I come back, he accepts some of the care. The patient also cried throughout the day, depressed that he is in the hospital, as well as confused with reality.   Agitation during this shift? Yes  Description and time(s) of event: patient will get agitated with care and pain. He does not want any care that causes pain.     Active suicide ideation during this shift? No   Description and time(s) of event: n/a    Poor safety awareness or impulsivity during this shift? Yes  Description and time(s) of event: The patient tried to leave his bed this evening because he wanted to go home.     Noteworthy visitor interactions during this shift? N/a  Sleep/rest patterns observed during this shift? The patient rested most of the day. Very difficult to wake in the mornings.  Therapeutic interventions:    Distraction, Mobilization, Music, Redirection, and Reorientation  Additional Comments: n/a

## 2023-11-17 NOTE — Progress Notes
 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  male admitted to [facility] on 10/28/2023 with the following issues: stroke      Rehabilitation Plan  Tentative discharge date: 11/19/2023  Rehabilitation: Patient will continue with comprehensive therapies at modified therapy schedule including physical therapy, occupational therapy, speech & language pathology, specialized rehab nursing, neuropsychology and physiatry oversight.    Goals:  Patient Will Perform Basic Cares With: Partial/moderate assistance, Progressing  Patient Will Perform Household Mobility With: Partial/moderate assistance, At wheelchair level  Recommended therapy after discharge: Consistent assistance, Home setting with home health, Versus, Skilled nursing facility  PT recommended equipment: Too early to determine (lightweight vs custom wheelchair)  OT recommended equipment: Tub transfer bench    Daily Functional Update:  Stand to Sit Assist Level: Partial/moderate assistance (11/16/2023  2:30 PM)  Equipment: Hand hold assist (11/16/2023  2:30 PM)     Stand Pivot Assist Level: Partial/moderate assistance (11/16/2023  2:30 PM)  Equipment: Hand hold assist (11/16/2023  2:30 PM)     Slideboard No data recorded  No data recorded   Gait Distance: 25 (11/16/2023  2:30 PM)    Assist Level: Dependent (11/16/2023  2:30 PM)    Equipment: Hand hold assist (11/16/2023  2:30 PM)     Wheelchair Distance 1: 70 (11/16/2023  2:30 PM)    Assist Level: Partial/moderate assistance (11/16/2023  2:30 PM)     Toileting       Assist Level: Dependent (11/13/2023  2:45 PM)  Equipment: -- Jayson Steady) (11/07/2023 10:10 AM)     Toilet Transfer No data recorded   Upper Body Dressing Assist Level: Substantial/maximal assistance (11/16/2023  9:45 AM)     Lower Body Dressing Assist Level: Dependent (11/16/2023  9:45 AM)       Diet: PO: Ice chips only (11/07/2023  1:30 PM)      Orientation Log & Cognitive Log Scores for the past 72 hrs (Last 3 readings):   Orientation Log Score (Out of 30)   11/16/23 1430 5/30   11/15/23 1045 6/30           Current Medical  Problems/Risks of Medical Complications/Management    Acute Right Pontomedullary Ischemic Stroke  Proximal Right Intracranial Vertebral Artery Occlusion  Diffuse Intracranial Atherosclerosis  Bilateral Horizontal Gaze Palsy  History of Forced Left Gaze Deviation  Left Facial Palsy  Impairments: left hemiparesis, expressive aphasia, oropharyngeal dysphagia, right facial weakness, left facial droop with dysarthria, diplopia, concern for attention/cognitive impairments, LUE ataxia, neurogenic bowel, neurogenic bladder  Impaired mobility and ADL, impaired swallow and cognition/communication               > consult PT/OT/SLP and neuropsychology to evaluate and treat  > Agitation prevention: Metoprolol switched to propranolol 10MG  bid.  Working on improved sleep/wake cycle with adjustments to modafinil (increase AM dose to 200mg  at 0530, 100mg  1200 6/6) and Remeron.  > Agitation abortive: Trazodone 12.5 mg as needed.  If second line is needed, will reach out to psychiatry regarding secondary agent such as Seroquel or increasing trazodone as his Qtc has improved on 6/5 EKG.                   > Sleep optimization: melatonin 5mg  qhs, previously on seroquel nightly               > follow up: Neurology, PM&R, Cardiology     Secondary Stroke Risk Optimization  > Antiplatelet/Anticoagulation: DAPT with aspirin 81mg  daily and clopidogrel 75mg  daily for 90 days followed by aspirin 81mg  indefinitely  > Blood Pressure Goal: <160 during acute hospitalization, long term goal of < 130/80               - Current antihypertensive medications: amlodipine 5mg  daily, lisinopril 20mg  daily, propranolol 10mg  BID (started 6/5)  > Lipids: LDL is 161, with goal of <70: rosuvastatin 40mg  daily (per Cardiology recs)  > Blood Glucose: Hgb A1c is 11.7%, with goal of <7  > Tobacco abstinence goal     Bladder Management  Neurogenic Bladder with Retention       Urinary tract infection  -Post-Void (PVR) Bladder Scan (mL): 304 milliliters (11/15/2023  9:10 AM)  -Straight Cath (mL): 300 (11/17/2023  5:45 AM)  - One episode of self-void 5/28; doxazosin increased to 3mg   - s/p bactrim 160mg  bid 5/31-6/1 - urine cx +e. Coli, sensitive to bactrim  > Monitor urinary retention per unit protocol. If unable to void or PVRs >300cc perform clean ISC (intermittent straight catheterization) for volumes > . Stop checking BVIs if 3 consecutive volumes <150cc.   > Discuss foley placement vs ISC following treatment of UTI / improvement in mood and participation (pt was noted to be pulling on PEG tube, will not place foley at this time in case he does so with foley catheter)  > Doxazosin 4mg  QHS (last increase 6/3)    Suicidal Ideation  Pseudobulbar Affect  Depression  - family denies knowledge of previous or current SI  - Per Neurology: Consider starting Dextromethorphan-Quinidine in the outpatient setting (unfortunately unavailble on the formulary inpatient) for Pseudobulbar affect, will start Fluoxetine 20mg  in the meantime --> instead was started on Sertraline 50mg , will continue while in rehab  - Per neuropsychology eval on 5/28: Pt?s reported mood was sad. Pt endorsed sadness and passive SI and also made a gesture of shooting himself. He reported that the last time he thought about it was yesterday.    > neuropsychology and psychiatry following   > Neurology clinic follow up on dc  > Appreciate Psychiatry Recommendations-    -- EKG 6/5- Qtc <470.   --  Continue Mirtazapine 7.5mg  QHS for mood, sleep, and appetite   -- Trazodone 12.5mg  BID PRN for agitation    -- Pt continues to endorse SI with plan  - Continue CO     Dysphagia s/p PEG tube placement  Moderate Malnutrition  - PEG tube placement 5/15               > Dietician consult, appreciate tube feed recommendations- transition from continuous to bolus feeds 5/19. Appreciate most updated nutrition recommendations, 5/27:  > ADAT to least restrictive per SLP; currently strict NPO with ice chips only when brightly alert   > EN Recs: Bolus Feeds of Jevity 1.5 x5 cartons daily; administered as 1 + 1/4 cartons ( ) QID  Provides 1778kcal, 76g protein, 901mL free fluids  Recommend at minimum water flushes of 30mL before and after each bolus feed for tube patency.   Additional fluids per primary team, with consideration for water boluses of 100mL before and after bolus feeds (provides additional 800mL daily for total fluids of 1771ml/d)  > Per videoswallow study 5/29- silently aspirated thin liquids and penetrated mildly thick liquids, too much oral weakness/uncoordination to consider dysphagia diet safely at this point; continue NPO at this time, continue to encourage ice chips throughout the day under supervision    Left Bell's Palsy  Discharge of the left eye - Improved  Left TMJ pain - Resolved  Ear Wax B/L ear - Improved  - s/p 1 week debrox               > Alternate eye patches; eyepatch for the L eye overnight, artificial tears TID daily in the setting of left facial palsy- increase drops to the left eye in the setting of L eye discharge. Monitor for ssx of infection to the left eye and consider erythromycin ointment if infection suspected   > lidocaine patch and tylenol to be continued as pt reports these improve left sided jaw pain    Oral Thrush               > oral care TID   > Nystatin swish and spit started 5/20, ok'd by SLP     Hyponatremia - Stable  Hyperkalemia - Resolved  - K 6/4 5.3  - Na 6/4 131  - s/p lokelma most recently 6/4  - serum osm WNL  - TSH and LFTs 6/6 WNL   > monitor intermittent BMP for improvement in Na   > if hyponatremia worsens, consider IM consult    Daytime Sleepiness - Improved  Fatigue - Improved  Increased Somnolence - Resolved  - Concern for increased somnolence 5/20 leading to Neurology Consult- rec CTA H&N; discussed with team may attempt to liberalize BP I.e. decreasing amlodipine to 5mg  due to symptoms possibly 2/2 to hypoperfusion  - CTA Head and Neck - negative for acute change  - Infectious workup including UA, CXR- negative for acute concerns  > appreciate further neurology recommendations- if acute focal weakness, numbness, coma, etc. Stroke activate/send to ED as he is high risk to have progression of his stroke/basilar involvement. Signed off.   > monitor on modafinil BID - ongoing adjustments as noted above to optimize alertness and participation though seems to be largely psych-related. Trial of modafinil 200mg  in the AM and 100mg  midday 6/6.    Bowel Management  At risk for Neurogenic Bowel  Loose stools   Constipation  -Last Bowel Movement Date: 11/17/23 (11/17/2023  4:30 AM)  - Goal for a daily  BM or every 24-48 hours.   - c diff negative 5/18  > adjust bowel regimen PRN   > loperamide PRN  > discontinue senna and miralax 6/6 for loose stools     Hypertension  Orthostatic Hypotension  LVOT Obstruction  PFO  CAD s/p PCI               > metoprolol 25mg  BID - change to propranolol 10mg  BID 6/5               > amlodipine 5mg  daily, lisinopril 20mg  daily               > 30 day MCOT    > compression stockings to prevent sx orthostatic hypotension               > Follow up with outpatient Cardiology      Diabetes Mellitus               > Endocrinology consult for ongoing discharge regimen planning    Aspiration Pneumonia - Resolved  S/p AHRF  - increased O2 req overnight 5/13 with elevated procal, tachycardia, leukocytosis  - CXR 5/13 notable for mild basal opacities, favored to represent atelectasis  - s/p vanc x1 and zosyn x3 days  - sputum cx NGTD  - Augmentin to complete total 7 day course of antibiotics through 5/20 AM    Nutrition:   Body mass index is 24.21 kg/m?SABRA  Malnutrition Details:  Malnutrition present on admission  ICD-10 code E44: Chronic illness/Moderate non-severe malnutrition      Mild loss of muscle mass, Mild loss of body fat, Energy intake: Less than 75% of estimated energy requirement for 1 month or more        Loss of Subcutaneous Fat: Yes Mild Orbital, Triceps  Muscle Wasting: Yes Moderate Clavicle, Temple, Deltoid               Malnutrition Interventions: Continue to provide EN to meet 100% estimated protein and kcal needs    Wounds:          DVT ppx: enoxaparin (LOVENOX) syringe 40 mg  QDAY(21); patient will not require at the time of discharge    Code Status: Full Code    Current Medications:  Scheduled Meds:acetaminophen (TYLENOL) tablet 650 mg, 650 mg, Feeding Tube, Q6H*  amLODIPine (NORVASC) tablet 5 mg, 5 mg, Per NG tube, QDAY  artificial tears (PF) single dose ophthalmic solution 2 drop, 2 drop, Both Eyes, TID  aspirin chewable tablet 81 mg, 81 mg, Per NG tube, QDAY  clopiDOGreL (PLAVIX) tablet 75 mg, 75 mg, Per NG tube, QDAY  diclofenac sodium (VOLTAREN) 1 % topical gel 2 g, 2 g, Topical, QID  Diet Enteral Feeding Bolus, , SEE ADMIN INSTRUCTIONS, QID (3,87,83,79)   And  Water Bolus, 30 mL, PEG Tube, QID (3,87,83,79)  doxazosin (CARDURA) tablet 4 mg, 4 mg, Feeding Tube, QHS  enoxaparin (LOVENOX) syringe 40 mg, 40 mg, Subcutaneous, QDAY(21)  insulin aspart (U-100) (NOVOLOG FLEXPEN U-100 INSULIN) injection PEN 0-12 Units, 0-12 Units, Subcutaneous, QID  insulin aspart (U-100) (NOVOLOG FLEXPEN U-100 INSULIN) injection PEN 17 Units, 17 Units, Subcutaneous, QID  insulin glargine (LANTUS SOLOSTAR U-100 INSULIN) injection PEN 16 Units, 16 Units, Subcutaneous, QDAY  lidocaine (LIDODERM) 5 % topical patch 1 patch, 1 patch, Topical, QDAY  lisinopriL (ZESTRIL) tablet 20 mg, 20 mg, Feeding Tube, QDAY  melatonin tablet 5 mg, 5 mg, Feeding Tube, QHS  mirtazapine (REMERON) tablet 7.5 mg, 7.5 mg, Oral, QHS  modafiniL (PROVIGIL) tablet 200 mg, 200 mg, Oral, QDAY   And  modafiniL (PROVIGIL) tablet 100 mg, 100 mg, Oral, QDAY  nystatin (MYCOSTATIN) oral suspension 500,000 Units, 500,000 Units, Swish & Spit, QID  petrolatum (STYE) ophthalmic ointment 0.25 inch, 0.25 inch, Both Eyes, TID  propranoloL (INDERAL) tablet 10 mg, 10 mg, Per NG tube, BID  rosuvastatin (CRESTOR) tablet 40 mg, 40 mg, Feeding Tube, QDAY  thiamine mononitrate (vit B1) tablet 100 mg, 100 mg, Per NG tube, QDAY    Continuous Infusions:  PRN and Respiratory Meds:bisacodyL QDAY PRN, dextrose 50% (D50) IV PRN, diclofenac sodium QID PRN, lidocaine hcl PRN, loperamide TID PRN, oxyCODONE Q12H PRN, pancrelipase 20,880 Units/sodium bicarbonate 650 mg ([place] CLOG DESTROYER) PRN (On Call from Rx), phenoL PRN, trazodone(#) BID PRN      Subjective     Bloody stool overnight, painless and frank blood. Will obtain CBC today to trend. VS WNL, no acute concerns for active hemorrhage. Also noted cloudy urine since yesterday, will obtain UA. Reports of some mild wheezing per family, will also order CXR. Otherwise no acute complaints or concerns in morning. Reports much improved sleep. Pain well controlled with multimodal regimen. Tolerating therapy activities well, behavior improved with adequate spacing between therapies. Tolerating modafinil well, will continue to monitor behavior. BMP stable trend.      Objective                          Vital Signs: Last Filed                 Vital Signs: 24 Hour Range   BP: 117/51 (06/08 0355)  Temp: 36.3 ?C (97.4 ?F) (06/08 0355)  Pulse: 72 (06/08 0355)  Respirations: 18 PER MINUTE (06/08 0355)  SpO2: 95 % (06/08 0355)  O2 Device: None (Room air) (06/08 0355) BP: (117-143)/(46-71)   Temp:  [36.3 ?C (97.4 ?F)-36.6 ?C (97.9 ?F)]   Pulse:  [72-81]   Respirations:  [18 PER MINUTE]   SpO2:  [95 %-96 %]   O2 Device: None (Room air)     Vitals:    10/30/23 1511 11/04/23 0401 11/11/23 0638   Weight: 59.4 kg (131 lb) 63.7 kg (140 lb 6.9 oz) 66 kg (145 lb 8.1 oz)       Intake/Output Summary:  (Last 24 hours)    Intake/Output Summary (Last 24 hours) at 11/17/2023 0818  Last data filed at 11/17/2023 9370  Gross per 24 hour   Intake 712 ml   Output 1800 ml   Net -1088 ml        Last Bowel Movement Date: 11/17/23    Labs--reviewed  Results for orders placed or performed during the hospital encounter of 10/28/23 (from the past 24 hours)   POC GLUCOSE    Collection Time: 11/16/23 11:38 AM   # # Low-High    Glucose, POC 174 (H) 70 - 100 mg/dL   POC GLUCOSE    Collection Time: 11/16/23  4:13 PM   # # Low-High    Glucose, POC 178 (H) 70 - 100 mg/dL   POC GLUCOSE    Collection Time: 11/16/23 10:55 PM   # # Low-High    Glucose, POC 123 (H) 70 - 100 mg/dL   POC GLUCOSE    Collection Time: 11/17/23  6:27 AM   # # Low-High    Glucose, POC 154 (H) 70 - 100 mg/dL         Physical Exam  General: No acute distress, alert elderly male lying in bed, family at bedside  HEENT: NCAT  Heart: Extremities warm and well perfused  Lungs: Normal work of breathing on room air  Abdomen: +Gtube, soft, non-distended, non-tender  Extremities: Peripheral edema is not present  Psych: pleasant and interactive this AM  Neuro: Somnolent this morning.    Labs & Therapy Notes Reviewed    Ladonna Idell LULLA Orlan, MD   Physical Medicine & Rehabilitation  For patient care questions contact Rehab Medicine 3 FIRST CALL via Voalte.

## 2023-11-17 NOTE — Progress Notes
 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)    Postherpetic neuralgia    Essential tremor    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    Poorly controlled diabetes mellitus (CMS-HCC)    Nephrotic range proteinuria    Ischemic stroke (CMS-HCC)    Moderate malnutrition    On enteral nutrition    Adjustment disorder with depressed mood    Expressive aphasia      Endocrine Problem List:  Type 2 Diabetes - uncontrolled   A1C:  11.7% on 5.8.2025   Diabetes provider:  PCP   PTA meds:  non-adherant  Diabetes complications to include:   CAD / CVA / neuropathy / nephropathy / retinopathy  On enteral feeds    Recommendations:  Glargine 16 units SQ daily at noon  Aspart 17 units SQ QID with bolus feeds  Aspart correction factor 4x daily prior to bolus feeds - mid dose  ________________________________________________________________________    HPI:  Kevin Obrien is a 73 y.o. male.      Chart reviewed including diabetes flow sheet and POC glucose values:    Glucose, POC   Date/Time Value Ref Range Status   11/17/2023 0627 154 (H) 70 - 100 mg/dL Final   93/92/7974 7744 123 (H) 70 - 100 mg/dL Final   93/92/7974 8386 178 (H) 70 - 100 mg/dL Final   93/92/7974 8861 174 (H) 70 - 100 mg/dL Final   93/92/7974 9380 106 (H) 70 - 100 mg/dL Final   93/93/7974 7945 172 (H) 70 - 100 mg/dL Final   93/93/7974 8370 234 (H) 70 - 100 mg/dL Final   93/93/7974 8686 139 (H) 70 - 100 mg/dL Final   93/94/7977 7993 158 (H) 70 - 100 MG/DL Final   93/94/7977 8081 257 (H) 70 - 100 MG/DL Final   93/94/7977 8242 397 (H) 70 - 100 MG/DL Final   89/88/7978 8567 105 (H) 70 - 100 MG/DL Final   90/85/7979 8280 129 (H) 70 - 100 MG/DL Final   90/85/7979 9340 126 (H) 70 - 100 MG/DL Final   90/85/7979 9655 182 (H) 70 - 100 MG/DL Final   88/78/7980 9048 266  Final       Diet:  NPO x/ ice chips  Nutrition:    Diet Enteral Feeding Bolus  (Enteral Feeding Bolus With Water Bolus)     Discontinue     Question Answer Comment   Enteral Substance: Jevity 1.5    Amount Per Bolus: 1.25cartons or    Feeding Route: PEG/Gastrostomy       And Linked Group Details      Steroid:  none    Review of systems:  Slept well per family report.     PTA DM Meds:  Non-adherent  to prandial insulin    Medications  Scheduled Meds:acetaminophen (TYLENOL) tablet 650 mg, 650 mg, Feeding Tube, Q6H*  amLODIPine (NORVASC) tablet 5 mg, 5 mg, Per NG tube, QDAY  artificial tears (PF) single dose ophthalmic solution 2 drop, 2 drop, Both Eyes, TID  aspirin chewable tablet 81 mg, 81 mg, Per NG tube, QDAY  clopiDOGreL (PLAVIX) tablet 75 mg, 75 mg, Per NG tube, QDAY  diclofenac sodium (VOLTAREN) 1 % topical gel 2 g, 2 g, Topical, QID  Diet Enteral Feeding Bolus, , SEE ADMIN INSTRUCTIONS, QID (3,87,83,79)   And  Water Bolus, 30 mL, PEG Tube, QID (3,87,83,79)  doxazosin (CARDURA) tablet 4 mg, 4 mg, Feeding Tube, QHS  enoxaparin (LOVENOX) syringe 40 mg, 40 mg, Subcutaneous, QDAY(21)  insulin aspart (U-100) (NOVOLOG FLEXPEN U-100 INSULIN) injection PEN 0-12 Units, 0-12 Units, Subcutaneous, QID  insulin aspart (U-100) (NOVOLOG FLEXPEN U-100 INSULIN) injection PEN 17 Units, 17 Units, Subcutaneous, QID  insulin glargine (LANTUS SOLOSTAR U-100 INSULIN) injection PEN 16 Units, 16 Units, Subcutaneous, QDAY  lidocaine (LIDODERM) 5 % topical patch 1 patch, 1 patch, Topical, QDAY  lisinopriL (ZESTRIL) tablet 20 mg, 20 mg, Feeding Tube, QDAY  melatonin tablet 5 mg, 5 mg, Feeding Tube, QHS  mirtazapine (REMERON) tablet 7.5 mg, 7.5 mg, Oral, QHS  modafiniL (PROVIGIL) tablet 200 mg, 200 mg, Oral, QDAY   And  modafiniL (PROVIGIL) tablet 100 mg, 100 mg, Oral, QDAY  nystatin (MYCOSTATIN) oral suspension 500,000 Units, 500,000 Units, Swish & Spit, QID  petrolatum (STYE) ophthalmic ointment 0.25 inch, 0.25 inch, Both Eyes, TID  propranoloL (INDERAL) tablet 10 mg, 10 mg, Per NG tube, BID  rosuvastatin (CRESTOR) tablet 40 mg, 40 mg, Feeding Tube, QDAY  thiamine mononitrate (vit B1) tablet 100 mg, 100 mg, Per NG tube, QDAY    Continuous Infusions:  PRN and Respiratory Meds:bisacodyL QDAY PRN, dextrose 50% (D50) IV PRN, diclofenac sodium QID PRN, lidocaine hcl PRN, loperamide TID PRN, oxyCODONE Q12H PRN, pancrelipase 20,880 Units/sodium bicarbonate 650 mg ([place] CLOG DESTROYER) PRN (On Call from Rx), phenoL PRN, trazodone(#) BID PRN             Objective                       Vital Signs: Last Filed                 Vital Signs: 24 Hour Range   BP: 117/51 (06/08 0355)  Temp: 36.3 ?C (97.4 ?F) (06/08 0355)  Pulse: 72 (06/08 0355)  Respirations: 18 PER MINUTE (06/08 0355)  SpO2: 95 % (06/08 0355)  O2 Device: None (Room air) (06/08 0355) BP: (117-143)/(46-71)   Temp:  [36.3 ?C (97.4 ?F)-36.6 ?C (97.9 ?F)]   Pulse:  [72-81]   Respirations:  [18 PER MINUTE]   SpO2:  [95 %-96 %]   O2 Device: None (Room air)     Vitals:    10/30/23 1511 11/04/23 0401 11/11/23 0638   Weight: 59.4 kg (131 lb) 63.7 kg (140 lb 6.9 oz) 66 kg (145 lb 8.1 oz)         Physical Exam  Deferred today        Lab Review  Comprehensive Metabolic Profile    Lab Results   Component Value Date/Time    NA 133 (L) 11/16/2023 06:47 AM    K 5.2 (H) 11/16/2023 06:47 AM    CL 98 11/16/2023 06:47 AM    CO2 25 11/16/2023 06:47 AM    GAP 10  11/16/2023 06:47 AM    BUN 32 (H) 11/16/2023 06:47 AM    CR 0.92 11/16/2023 06:47 AM    GLU 112 (H) 11/16/2023 06:47 AM    Lab Results   Component Value Date/Time    CA 10.0 11/16/2023 06:47 AM ALBUMIN 3.6 11/15/2023 06:30 AM    TOTPROT 6.8 11/15/2023 06:30 AM    ALKPHOS 77 11/15/2023 06:30 AM    AST 15 11/15/2023 06:30 AM    ALT 23 11/15/2023 06:30 AM    TOTBILI 0.3 11/15/2023 06:30 AM    GFR >60 11/16/2023 06:47 AM    GFRAA >60 02/22/2019 06:28 PM        Lab Results   Component Value Date    CHOL 216 (H) 10/17/2023    TRIG 219 (H) 10/17/2023    HDL 36 (L) 10/17/2023    LDL 161 (H) 10/17/2023    VLDL 43.8 10/17/2023    NONHDLCHOL 180 10/17/2023      TSH   Date Value Ref Range Status   11/15/2023 1.02 0.35 - 5.00 ?IU/mL Final     Microalbumin/CR ratio Urine   Date Value Ref Range Status   05/06/2017 285.85 (H) <30 ug/mg Final     Comment:     NOTE NEW REFERENCE RANGES           Glucose testing    Recent Labs     11/15/23  1313 11/15/23  1629 11/15/23  2054 11/16/23  0619 11/16/23  1138 11/16/23  1613 11/16/23  2255 11/17/23  0627   GLUPOC 139* 234* 172* 106* 174* 178* 123* 154*       Hemoglobin A1C   Date Value Ref Range Status   10/17/2023 11.7 (H) 4.0 - 5.7 % Final     Comment:     The ADA recommends that most patients with type 1 and type 2 diabetes maintain an A1c level <7%.           Anette CHRISTELLA Caldron, MD   [phone]

## 2023-11-18 LAB — POC GLUCOSE
~~LOC~~ BKR POC GLUCOSE: 312 mg/dL — ABNORMAL HIGH (ref 70–100)
~~LOC~~ BKR POC GLUCOSE: 143 mg/dL — ABNORMAL HIGH (ref 70–100)

## 2023-11-18 LAB — IRON + BINDING CAPACITY + %SAT+ FERRITIN: ~~LOC~~ BKR IRON: 104 g/dL (ref 50–185)

## 2023-11-18 NOTE — Progress Notes
 PHYSICAL THERAPY  NOTE      Name: Kevin Obrien   MRN: [id number]     DOB: [birth year]      Age: 73 y.o.  Admission Date: 10/28/2023     LOS: 21 days     Date of Service: 11/18/2023         11/18/23 1000   Type of Note   Type of Note Daily   Time Calculation   Start Time 1000   Stop Time 1045   Time Calculation 45   $$ Gait / Mobility 1 unit    $$ PT Therapeutic Activity 1 unit   $$ Wh/chr Mngt/Mob 1 unit    Subjective   Subjective Patient in bed upon arrival, agreeable to PT. Very tearful throughout session, reports he wants to go home. Sitting in wheelchair with SLP at end of encounter.   Pain   Pain Scale No Pain   Precautions   Comments L sided weakness, visual impairment (horizontal gaze palsy), PEG   Cognitive   Orientation Person   Patient Behavior Cooperative   Family Behavior Supportive   Cognition Poor Safety Awareness   Lying to Sitting on Side of Bed   Patient Needs Assistance With Trunk;Extra time to complete the activity   Assist Level Substantial/maximal assistance   Equipment No rails;HOB flat   Comments once sitting edge of bed, patient lost balance backward and requied max A to sit back up   Sit to and from Stand   Sit to and from Stand Sit to stand;Stand to sit   Sit to Stand   Patient Needs Assistance With Facilitation of trunk;Facilitation of weight shift;Balance   Assist Level Partial/moderate assistance   Equipment Standard wheelchair  (vertical rail)   Stand to Sit   Patient Needs Assistance With Facilitation of weight shift;Balance;Extra time to complete activity;Financial planner;Facilitation of hip flexion;Facilitation of trunk;Cues for positioning of hands   Assist Level Partial/moderate assistance   Equipment Standard wheelchair   Gait   Distance Distance 1   Gait Distance 1   Distance 20 + 40   Patient Needs Assistance With Facilitation of weight shift;Cues for step length;Balance;Facilitation of trunk   Assist Level Dependent   Equipment Wheelchair follow  (vertical rails along fence on rooftop)   Patterns Decreased gait velocity;Narrow base of support;Shuffling;Trunk lean forward   Deviation Left Decreased hip extension;Decreased knee flexion in swing;Decreased stride length;No heel strike/foot flat;Left scissoring   Deviation Right Decreased hip extension;Decreased knee flexion in swing;Decreased stride length;No heel strike/foot flat;Right scissoring   Activity Limited By Weakness;Complaint of fatigue   Wheelchair Distance 1   Distance 1 50x2   Type Ultralight wheelchair   Patient Needs Assistance With Turns;Path navigation;Propulsion   Assist Level Partial/moderate assistance   Propulsion Method Bilateral upper extremity   Comments cues for propulsion technique   Therapeutic Interventions   Therapeutic Activities patient very tearful throughout session, took outside in wheelchair for fresh air and sunshine which he reports made him feel better   Assessment   Assessment Patient participated well this morning, despite crying majority of the session. Continues to require variable assist for bed mobility, transfers, and short distance gait. Patient also requiring min to mod A for wheelchair propulsion, as he has tendency to perform short propulsions. Anticipate patient would require more assist on more uneven terrain or over thresholds; will benefit from ongoing training.   Weekly PT Progress Ax1 bed mobility and transfers, Ax2 for short disatnce gait, workong on wheelchair propulsion in  ultralight wheelchair   Weekly PT Barriers/Concerns inconsistent participation, variable need for assist, impaired safety awareness, high risk for falls   Plan   PT Plan gait training Ax2 (hand hold assist, vector to help with posture vs rifton), neurogym for higher reps (consider using mirror for visual feedback), standing/reaching activities to promote upright posture, trial stairs as appropriate, nustep for conditioning, outcome measures as appropriate, continued wc ultralight propulsion; standing at plinth activities   Recommendations   PT Discharge Recommendations Consistent assistance;Home setting with home health;Versus;Skilled nursing facility   PT Equipment Recommendations Too early to determine  (lightweight vs custom wheelchair if dc home)   Overall Team Goal   Patient Will Perform Household Mobility With Partial/moderate assistance;At wheelchair level         Therapist: Tereasa Free, PT, DPT  Date: 11/18/2023

## 2023-11-18 NOTE — Progress Notes
 Acute Inpatient Rehabilitation Behavior Summary of Shift    Name: Kevin Obrien   MRN: [id number]     DOB: [birth year]      Age: 73 y.o.  Admission Date: 10/28/2023     LOS: 21 days     Date of Service: 11/18/2023      Reason for increased observation? Poor safety awareness, impulsivity  One-on-one safety watch: No  Q15-30 minute safety watch: No  Suicide precaution: No  Telemonitoring: Yes    Behavior outbursts during this shift? No  Description and time(s) of event: N/A   Agitation during this shift? No  Description and time(s) of event: N/A      Active suicide ideation during this shift? No   Description and time(s) of event: N/A    Poor safety awareness or impulsivity during this shift? Yes  Description and time(s) of event: Patient attempted to get out of bed without assistance 2 times during the shift. Bed alarm went off and tele sitter notified the nursing. Family is at the bedside , helpful with redirecting the pt when awake.    Noteworthy visitor interactions during this shift? 2 family members at the bedside the entire night  Sleep/rest patterns observed during this shift? Patient slept well most of the night. Easily awaken for cares and goes back to sleep right away.  Therapeutic interventions:    Redirection and Reorientation  Additional Comments: n/a

## 2023-11-18 NOTE — Progress Notes
 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  male admitted to [facility] on 10/28/2023 with the following issues: stroke      Rehabilitation Plan  Tentative discharge date: 11/22/2023  Rehabilitation: Patient will continue with comprehensive therapies at modified therapy schedule including physical therapy, occupational therapy, speech & language pathology, specialized rehab nursing, neuropsychology and physiatry oversight.    Goals:  Patient Will Perform Basic Cares With: Partial/moderate assistance, Progressing  Patient Will Perform Household Mobility With: Partial/moderate assistance, At wheelchair level  Recommended therapy after discharge: Consistent assistance, Home setting with home health, Versus, Skilled nursing facility  PT recommended equipment: Too early to determine (lightweight vs custom wheelchair)  OT recommended equipment: Tub transfer bench    Daily Functional Update:  Stand to Sit Assist Level: Partial/moderate assistance (11/16/2023  2:30 PM)  Equipment: Hand hold assist (11/16/2023  2:30 PM)     Stand Pivot Assist Level: Partial/moderate assistance (11/16/2023  2:30 PM)  Equipment: Hand hold assist (11/16/2023  2:30 PM)     Slideboard No data recorded  No data recorded   Gait Distance: 25 (11/16/2023  2:30 PM)    Assist Level: Dependent (11/16/2023  2:30 PM)    Equipment: Hand hold assist (11/16/2023  2:30 PM)     Wheelchair Distance 1: 70 (11/16/2023  2:30 PM)    Assist Level: Partial/moderate assistance (11/16/2023  2:30 PM)     Toileting       Assist Level: Dependent (11/13/2023  2:45 PM)  Equipment: -- Jayson Steady) (11/07/2023 10:10 AM)     Toilet Transfer No data recorded   Upper Body Dressing Assist Level: Substantial/maximal assistance (11/16/2023  9:45 AM)     Lower Body Dressing Assist Level: Dependent (11/16/2023  9:45 AM)       Diet: PO: Ice chips only (11/07/2023  1:30 PM)      Orientation Log & Cognitive Log Scores for the past 72 hrs (Last 3 readings):   Orientation Log Score (Out of 30)   11/16/23 1430 5/30           Current Medical Problems/Risks of Medical Complications/Management  Acute Right Pontomedullary Ischemic Stroke  Proximal Right Intracranial Vertebral Artery Occlusion  Diffuse Intracranial Atherosclerosis  Bilateral Horizontal Gaze Palsy  History of Forced Left Gaze Deviation  Left Facial Palsy  Impairments: left hemiparesis, expressive aphasia, oropharyngeal dysphagia, right facial weakness, left facial droop with dysarthria, diplopia, concern for attention/cognitive impairments, LUE ataxia, neurogenic bowel, neurogenic bladder  Impaired mobility and ADL, impaired swallow and cognition/communication               > consult PT/OT/SLP and neuropsychology to evaluate and treat  > Agitation prevention: Metoprolol switched to propranolol 10MG  bid.  Working on improved sleep/wake cycle with adjustments to modafinil (increase AM dose to 200mg  at 0530, 100mg  1200 6/6) and Remeron.  > Agitation abortive: Trazodone 12.5 mg as needed.  If second line is needed, will reach out to psychiatry regarding secondary agent such as Seroquel or increasing trazodone as his Qtc has improved on 6/5 EKG.                   > Sleep optimization: melatonin 5mg  qhs, previously on seroquel nightly               > follow up: Neurology, PM&R, Cardiology     Secondary Stroke Risk Optimization  > Antiplatelet/Anticoagulation: DAPT with aspirin 81mg  daily and clopidogrel 75mg  daily for 90 days followed by aspirin 81mg  indefinitely  > Blood Pressure Goal: <160 during acute hospitalization, long term goal of < 130/80               - Current antihypertensive medications: amlodipine 5mg  daily, lisinopril 20mg  daily, propranolol 10mg  BID (started 6/5)  > Lipids: LDL is 161, with goal of <70: rosuvastatin 40mg  daily (per Cardiology recs)  > Blood Glucose: Hgb A1c is 11.7%, with goal of <7  > Tobacco abstinence goal     Urinary tract infection  Neurogenic Bladder with Retention       -Post-Void (PVR) Bladder Scan (mL): 304 milliliters (11/15/2023  9:10 AM)  -Straight Cath (mL): 300 (11/18/2023  2:00 AM)  - One episode of self-void 5/28; doxazosin increased to 3mg   - s/p bactrim 160mg  bid 5/31-6/1 - urine cx +e. Coli, sensitive to bactrim  -Patient with worsening cloudy urine 6/7, urinalysis obtained 6/8 notable for 3+ leukocytes, Bactrim restarted for a nongated course of 5-7 days.  Culture pending  > Monitor urinary retention per unit protocol. If unable to void or PVRs >300cc perform clean ISC (intermittent straight catheterization) for volumes > . Stop checking BVIs if 3 consecutive volumes <150cc.   > Discuss foley placement vs ISC following treatment of UTI / improvement in mood and participation (pt was noted to be pulling on PEG tube, will not place foley at this time in case he does so with foley catheter)  > Doxazosin 4mg  QHS (last increase 6/3)  > Bactrim started 6/8  > Monitor urine culture    Suicidal Ideation  Pseudobulbar Affect  Depression  - family denies knowledge of previous or current SI  - Per Neurology: Consider starting Dextromethorphan-Quinidine in the outpatient setting (unfortunately unavailble on the formulary inpatient) for Pseudobulbar affect, will start Fluoxetine 20mg  in the meantime --> instead was started on Sertraline 50mg , will continue while in rehab  - Per neuropsychology eval on 5/28: Pt?s reported mood was sad. Pt endorsed sadness and passive SI and also made a gesture of shooting himself. He reported that the last time he thought about it was yesterday.    > neuropsychology and psychiatry  following   > Neurology clinic follow up on dc  > Appreciate Psychiatry Recommendations-    -- EKG 6/5- Qtc <470.   -- Continue Mirtazapine 7.5mg  QHS for mood, sleep, and appetite   -- Trazodone 12.5mg  BID PRN for agitation    -- Pt denies SI-okay to discontinue CO, transition to one-to-one safety watch in the setting of impulsivity and elopement risk as indicated     Dysphagia s/p PEG tube placement  Moderate Malnutrition  - PEG tube placement 5/15               > Dietician consult, appreciate tube feed recommendations- transition from continuous to bolus feeds 5/19. Appreciate most updated nutrition recommendations, 5/27:  > ADAT to least restrictive per SLP; currently strict NPO with ice chips only when brightly alert   > EN Recs: Bolus Feeds of Jevity 1.5 x5 cartons daily; administered as 1 + 1/4 cartons ( ) QID  Provides 1778kcal, 76g protein, 901mL free fluids  Recommend at minimum water flushes of 30mL before and after each bolus feed for tube patency.   Additional fluids per primary team, with consideration for water boluses of 100mL before and after bolus feeds (provides additional 800mL daily for total fluids of 1767ml/d)  > Per videoswallow study 5/29- silently aspirated thin liquids and penetrated mildly thick liquids, too much oral weakness/uncoordination to consider dysphagia diet safely at this point; continue NPO at this time, continue to encourage ice chips throughout the day under supervision    Left Bell's Palsy  Discharge of the left eye - Improved  Left TMJ pain - Resolved  Ear Wax B/L ear - Improved  - s/p 1 week debrox               > Alternate eye patches; eyepatch for the L eye overnight, artificial tears TID daily in the setting of left facial palsy- increase drops to the left eye in the setting of L eye discharge. Monitor for ssx of infection to the left eye and consider erythromycin ointment if infection suspected   > lidocaine patch and tylenol to be continued as pt reports these improve left sided jaw pain    Oral Thrush               > oral care TID   > Nystatin swish and spit started 5/20, ok'd by SLP     Hyponatremia - Stable  Hyperkalemia - Resolved  - K 6/4 5.3  - Na 6/4 131  - s/p lokelma most recently 6/4  - serum osm WNL  - TSH and LFTs 6/6 WNL   > monitor intermittent BMP for improvement in Na   > if hyponatremia worsens, consider IM consult    Daytime Sleepiness - Improved  Fatigue - Improved  Increased Somnolence - Resolved  - Concern for increased somnolence 5/20 leading to Neurology Consult- rec CTA H&N; discussed with team may attempt to liberalize BP I.e. decreasing amlodipine to 5mg  due to symptoms possibly 2/2 to hypoperfusion  - CTA Head and Neck - negative for acute change  - Infectious workup including UA, CXR- negative for acute concerns  > appreciate further neurology recommendations- if acute focal weakness, numbness, coma, etc. Stroke activate/send to ED as he is high risk to have progression of his stroke/basilar involvement. Signed off.   > monitor on modafinil BID - ongoing adjustments as noted above to optimize alertness and participation though seems to be largely psych-related. Trial of modafinil 200mg  in the AM and  100mg  midday 6/6.    Bowel Management  At risk for Neurogenic Bowel  Loose stools   Constipation  -Last Bowel Movement Date: 11/17/23 (11/17/2023  8:25 PM)  - Goal for a daily BM or every 24-48 hours.   - c diff negative 5/18  > adjust bowel regimen PRN   > loperamide PRN  > discontinue senna and miralax 6/6 for loose stools     Hypertension  Orthostatic Hypotension  LVOT Obstruction  PFO  CAD s/p PCI               > metoprolol 25mg  BID - change to propranolol 10mg  BID 6/5               > amlodipine 5mg  daily, lisinopril 20mg  daily               > 30 day MCOT    > compression stockings to prevent sx orthostatic hypotension               > Follow up with outpatient Cardiology      Diabetes Mellitus               > Endocrinology consult for ongoing discharge regimen planning    Aspiration Pneumonia - Resolved  S/p AHRF  - increased O2 req overnight 5/13 with elevated procal, tachycardia, leukocytosis  - CXR 5/13 notable for mild basal opacities, favored to represent atelectasis  - s/p vanc x1 and zosyn x3 days  - sputum cx NGTD  - Augmentin to complete total 7 day course of antibiotics through 5/20 AM    Nutrition:   Body mass index is 23.96 kg/m?SABRA  Malnutrition Details:  Malnutrition present on admission  ICD-10 code E44: Chronic illness/Moderate non-severe malnutrition      Mild loss of muscle mass, Mild loss of body fat, Energy intake: Less than 75% of estimated energy requirement for 1 month or more        Loss of Subcutaneous Fat: Yes Mild Orbital, Triceps  Muscle Wasting: Yes Moderate Clavicle, Temple, Deltoid               Malnutrition Interventions: Continue to provide EN to meet 100% estimated protein and kcal needs    Wounds:          DVT ppx: enoxaparin (LOVENOX) syringe 40 mg  QDAY(21); patient will not require at the time of discharge    Code Status: Full Code    Current Medications:  Scheduled Meds:acetaminophen (TYLENOL) tablet 650 mg, 650 mg, Feeding Tube, Q6H*  amLODIPine (NORVASC) tablet 5 mg, 5 mg, Per NG tube, QDAY  artificial tears (PF) single dose ophthalmic solution 2 drop, 2 drop, Both Eyes, TID  aspirin chewable tablet 81 mg, 81 mg, Per NG tube, QDAY  clopiDOGreL (PLAVIX) tablet 75 mg, 75 mg, Per NG tube, QDAY  diclofenac sodium (VOLTAREN) 1 % topical gel 2 g, 2 g, Topical, QID  Diet Enteral Feeding Bolus, , SEE ADMIN INSTRUCTIONS, QID (3,87,83,79)   And  Water Bolus, 30 mL, PEG Tube, QID (3,87,83,79)  doxazosin (CARDURA) tablet 4 mg, 4 mg, Feeding Tube, QHS  enoxaparin (LOVENOX) syringe 40 mg, 40 mg, Subcutaneous, QDAY(21)  insulin aspart (U-100) (NOVOLOG FLEXPEN U-100 INSULIN) injection PEN 0-12 Units, 0-12 Units, Subcutaneous, QID  insulin aspart (U-100) (NOVOLOG FLEXPEN U-100 INSULIN) injection PEN 17 Units, 17 Units, Subcutaneous, QID  insulin glargine (LANTUS SOLOSTAR U-100 INSULIN) injection PEN 16 Units, 16 Units, Subcutaneous, QDAY  lidocaine (LIDODERM) 5 % topical patch 1  patch, 1 patch, Topical, QDAY  lisinopriL (ZESTRIL) tablet 20 mg, 20 mg, Feeding Tube, QDAY  melatonin tablet 5 mg, 5 mg, Feeding Tube, QHS  mirtazapine (REMERON) tablet 7.5 mg, 7.5 mg, Oral, QHS  modafiniL (PROVIGIL) tablet 200 mg, 200 mg, Oral, QDAY   And  modafiniL (PROVIGIL) tablet 100 mg, 100 mg, Oral, QDAY  nystatin (MYCOSTATIN) oral suspension 500,000 Units, 500,000 Units, Swish & Spit, QID  petrolatum (STYE) ophthalmic ointment 0.25 inch, 0.25 inch, Both Eyes, TID  propranoloL (INDERAL) tablet 10 mg, 10 mg, Per NG tube, BID  rosuvastatin (CRESTOR) tablet 40 mg, 40 mg, Feeding Tube, QDAY  thiamine mononitrate (vit B1) tablet 100 mg, 100 mg, Per NG tube, QDAY  trimethoprim-sulfamethoxazole (BACTRIM DS) 160-800 mg tablet 1 tablet, 1 tablet, Feeding Tube, BID    Continuous Infusions:  PRN and Respiratory Meds:bisacodyL QDAY PRN, dextrose 50% (D50) IV PRN, diclofenac sodium QID PRN, lidocaine hcl PRN, loperamide TID PRN, oxyCODONE Q12H PRN, pancrelipase 20,880 Units/sodium bicarbonate 650 mg ([place] CLOG DESTROYER) PRN (On Call from Rx), phenoL PRN, trazodone(#) BID PRN      Subjective     No acute overnight events.  Patient seen at bedside with daughter present.  Patient is alert and interactive today, oncoming physician team with fist bump.  Patient reports being able to sleep last night.  Patient denies pain.  Patient denies questions or concerns today.  Daughter at bedside inquires about anemia and discussed possible iron deficiency.  Otherwise there have not been any more bloody stools.  Discussed monitor antibiotics course for treatment of persistent UTI.    Objective                          Vital Signs: Last Filed                 Vital Signs: 24 Hour Range   BP: 107/37 (06/09 0617)  Temp: 36.3 ?C (97.4 ?F) (06/09 0617)  Pulse: 68 (06/09 0617)  Respirations: 16 PER MINUTE (06/09 0617)  SpO2: 97 % (06/09 0617)  O2 Device: None (Room air) (06/09 0617) BP: (107-171)/(37-58)   Temp:  [36.3 ?C (97.4 ?F)]   Pulse:  [68-78]   Respirations:  [16 PER MINUTE]   SpO2:  [97 %]   O2 Device: None (Room air)     Vitals:    11/04/23 0401 11/11/23 0638 11/18/23 0500   Weight: 63.7 kg (140 lb 6.9 oz) 66 kg (145 lb 8.1 oz) 65.3 kg (143 lb 15.4 oz)       Intake/Output Summary:  (Last 24 hours)    Intake/Output Summary (Last 24 hours) at 11/18/2023 1107  Last data filed at 11/18/2023 9385  Gross per 24 hour   Intake 1072 ml   Output 1360 ml   Net -288 ml        Last Bowel Movement Date: 11/17/23    Labs--reviewed  Results for orders placed or performed during the hospital encounter of 10/28/23 (from the past 24 hours)   POC GLUCOSE    Collection Time: 11/17/23 11:27 AM   # # Low-High    Glucose, POC 178 (H) 70 - 100 mg/dL   CBC    Collection Time: 11/17/23 12:14 PM   # # Low-High    White Blood Cells 8.00 4.50 - 11.00 10*3/uL    Red Blood Cells 4.18 (L) 4.40 - 5.50 10*6/uL    Hemoglobin 12.8 (L) 13.5 - 16.5 g/dL    Hematocrit  36.1 (L) 40.0 - 50.0 %    MCV 86.4 80.0 - 100.0 fL    MCH 30.7 26.0 - 34.0 pg    MCHC 35.5 32.0 - 36.0 g/dL    RDW 87.0 88.9 - 84.9 %    Platelet Count 271 150 - 400 10*3/uL    MPV 8.2 7.0 - 11.0 fL   POC GLUCOSE    Collection Time: 11/17/23 12:43 PM   # # Low-High    Glucose, POC 140 (H) 70 - 100 mg/dL   URINALYSIS DIPSTICK REFLEX TO CULTURE    Collection Time: 11/17/23  1:57 PM    Specimen: Midstream; Urine   # # Low-High    Color,UA Yellow     Turbidity,UA 1+ (A) Clear    Specific Gravity-Urine 1.016 1.005 - 1.030    pH,UA 6.0 5.0 - 8.0    Protein,UA Negative Negative    Glucose,UA Negative Negative    Ketones,UA Negative Negative    Bilirubin,UA Negative Negative    Blood,UA Negative Negative    Urobilinogen,UA Increased (A) Normal    Nitrite,UA Negative Negative    Leukocytes,UA 3+ (A) Negative   URINALYSIS MICROSCOPIC REFLEX TO CULTURE    Collection Time: 11/17/23  1:57 PM    Specimen: Midstream; Urine   # # Low-High    WBCs,UA Packed (A) None, 0 - 2  /HPF    RBCs,UA 2 - 10 (A) None, 0 - 2  /HPF    Mucous,UA 1+ (A) None, Trace /LPF    Bacteria,UA Packed (A) None /HPF    WBC Clumps Present (A) None /HPF    Squamous Epithelial Cells 0 - 2 None, 0 - 2 , 2 - 5 /HPF   POC GLUCOSE    Collection Time: 11/17/23  6:05 PM   # # Low-High    Glucose, POC 182 (H) 70 - 100 mg/dL   POC GLUCOSE    Collection Time: 11/17/23  8:56 PM   # # Low-High    Glucose, POC 312 (H) 70 - 100 mg/dL   POC GLUCOSE    Collection Time: 11/18/23  6:17 AM   # # Low-High    Glucose, POC 143 (H) 70 - 100 mg/dL   CBC CELLULAR THERAPEUTICS    Collection Time: 11/18/23  6:29 AM   # # Sharleen    White Blood Cells 7.00 4.50 - 11.00 10*3/uL    Red Blood Cells 4.06 (L) 4.40 - 5.50 10*6/uL    Hemoglobin 12.3 (L) 13.5 - 16.5 g/dL    Hematocrit 65.0 (L) 40.0 - 50.0 %    MCV 85.9 80.0 - 100.0 fL    MCH 30.4 26.0 - 34.0 pg    MCHC 35.4 32.0 - 36.0 g/dL    RDW 87.0 88.9 - 84.9 %    Platelet Count 261 150 - 400 10*3/uL    MPV 8.4 7.0 - 11.0 fL   BASIC METABOLIC PANEL CELLULAR THERAPEUTICS    Collection Time: 11/18/23  6:29 AM   # # Low-High    Sodium 135 (L) 137 - 147 mmol/L    Potassium 4.6 3.5 - 5.1 mmol/L    Chloride 100 98 - 110 mmol/L    Glucose 144 (H) 70 - 100 mg/dL    Blood Urea Nitrogen 35 (H) 7 - 25 mg/dL    Creatinine 9.01 9.59 - 1.24 mg/dL    Calcium 9.6 8.5 - 89.3 mg/dL    CO2 25 21 - 30 mmol/L    Anion Gap  10 3 - 12    Glomerular Filtration Rate (GFR) >60 >60 mL/min         Physical Exam  General: No acute distress, alert elderly male lying in bed, daughter at bedside  HEENT: NCAT  Heart: Extremities warm and well perfused  Lungs: Normal work of breathing on room air  Abdomen: +Gtube, soft, non-distended, non-tender  Extremities: Peripheral edema is not present  Psych: pleasant and interactive this AM  Neuro: Alert, interactive. Bilateral shoulder abduction at least antigravity. Coordinated movements with fist bump. Noted ongoing left facial paralyis and bilateral horizontal gaze palsy.    Labs & Therapy Notes Reviewed    Corean CHRISTELLA Brewster, DO   Physical Medicine & Rehabilitation  For patient care questions contact Rehab Medicine 3 FIRST CALL via Voalte.

## 2023-11-18 NOTE — Progress Notes
 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     Glucose, POC   Date/Time Value Ref Range Status   11/18/2023 0617 143 (H) 70 - 100 mg/dL Final   93/91/7974 7943 312 (H) 70 - 100 mg/dL Final   93/91/7974 8194 182 (H) 70 - 100 mg/dL Final   93/91/7974 8756 140 (H) 70 - 100 mg/dL Final   93/91/7974 8872 178 (H) 70 - 100 mg/dL Final   93/91/7974 9372 154 (H) 70 - 100 mg/dL Final   93/92/7974 7744 123 (H) 70 - 100 mg/dL Final   93/92/7974 8386 178 (H) 70 - 100 mg/dL Final   93/94/7977 7993 158 (H) 70 - 100 MG/DL Final   93/94/7977 8081 257 (H) 70 - 100 MG/DL Final   93/94/7977 8242 397 (H) 70 - 100 MG/DL Final   89/88/7978 8567 105 (H) 70 - 100 MG/DL Final   90/85/7979 8280 129 (H) 70 - 100 MG/DL Final   90/85/7979 9340 126 (H) 70 - 100 MG/DL Final   90/85/7979 9655 182 (H) 70 - 100 MG/DL Final   88/78/7980 9048 266  Final     EN Recs: Bolus Feeds of Jevity 1.5/Isosource 1.5 x5 cartons daily; administered as 1.25 cartons ( ) QID.     Recommendations:     Glucose trends reviewed. BG largely at goal, isolated hyperglycemia yesterday seems to have resolved    Lantus 16 units daily  Novolog 17 units qid with bolus TF, hold if tube feeds are stopped or held  Novolog mid-dose CF, ACHS      Thank you for this consult, we will follow.  Please page me directly during 8am-5pm for questions, or the on-call endocrine fellow after 5pm.     Rockey Senters DO  Assistant Professor  Endocrinology, Diabetes & Metabolism  Pager: [phone] or available on Voalte     History of Present Illness    Alija Riano is a 73 y.o. year old male.  Resting in bed, largely non-verbal but he does mouth yes/no/thank you. Glucose trends reviewed.     Estimated Creatinine Clearance: 61.1 mL/min (based on SCr of 0.98 mg/dL).    Allergies     Allergies   Allergen Reactions    Seasonal Allergies RHINITIS       Medications   Scheduled Meds:acetaminophen (TYLENOL) tablet 650 mg, 650 mg, Feeding Tube, Q6H*  amLODIPine (NORVASC) tablet 5 mg, 5 mg, Per NG tube, QDAY  artificial tears (PF) single dose ophthalmic solution 2 drop, 2 drop, Both Eyes, TID  aspirin chewable tablet 81 mg, 81 mg, Per NG tube, QDAY  clopiDOGreL (PLAVIX) tablet 75 mg, 75 mg, Per NG tube, QDAY  diclofenac sodium (VOLTAREN) 1 % topical gel 2 g, 2 g, Topical, QID  Diet Enteral Feeding Bolus, , SEE ADMIN INSTRUCTIONS, QID (3,87,83,79)   And  Water Bolus, 30 mL, PEG Tube, QID (3,87,83,79)  doxazosin (CARDURA) tablet 4 mg, 4 mg, Feeding Tube, QHS  enoxaparin (LOVENOX) syringe 40 mg,  40 mg, Subcutaneous, QDAY(21)  insulin aspart (U-100) (NOVOLOG FLEXPEN U-100 INSULIN) injection PEN 0-12 Units, 0-12 Units, Subcutaneous, QID  insulin aspart (U-100) (NOVOLOG FLEXPEN U-100 INSULIN) injection PEN 17 Units, 17 Units, Subcutaneous, QID  insulin glargine (LANTUS SOLOSTAR U-100 INSULIN) injection PEN 16 Units, 16 Units, Subcutaneous, QDAY  lidocaine (LIDODERM) 5 % topical patch 1 patch, 1 patch, Topical, QDAY  lisinopriL (ZESTRIL) tablet 20 mg, 20 mg, Feeding Tube, QDAY  melatonin tablet 5 mg, 5 mg, Feeding Tube, QHS  mirtazapine (REMERON) tablet 7.5 mg, 7.5 mg, Oral, QHS  modafiniL (PROVIGIL) tablet 200 mg, 200 mg, Oral, QDAY   And  modafiniL (PROVIGIL) tablet 100 mg, 100 mg, Oral, QDAY  petrolatum (STYE) ophthalmic ointment 0.25 inch, 0.25 inch, Both Eyes, TID  propranoloL (INDERAL) tablet 10 mg, 10 mg, Per NG tube, BID  rosuvastatin (CRESTOR) tablet 40 mg, 40 mg, Feeding Tube, QDAY  thiamine mononitrate (vit B1) tablet 100 mg, 100 mg, Per NG tube, QDAY  trimethoprim-sulfamethoxazole (BACTRIM DS) 160-800 mg tablet 1 tablet, 1 tablet, Feeding Tube, BID    Continuous Infusions:  PRN and Respiratory Meds:bisacodyL QDAY PRN, dextrose 50% (D50) IV PRN, diclofenac sodium QID PRN, lidocaine hcl PRN, loperamide TID PRN, oxyCODONE Q12H PRN, pancrelipase 20,880 Units/sodium bicarbonate 650 mg ([place] CLOG DESTROYER) PRN (On Call from Rx), phenoL PRN, trazodone(#) BID PRN      Physical Examination                          Vital Signs: Last                  Vital Signs: 24 Hour Range   BP: 107/37 (06/09 0617)  Temp: 36.3 ?C (97.4 ?F) (06/09 0617)  Pulse: 68 (06/09 0617)  Respirations: 16 PER MINUTE (06/09 0617)  SpO2: 97 % (06/09 0617)  O2 Device: None (Room air) (06/09 0617) BP: (107-132)/(37-55)   Temp:  [36.3 ?C (97.4 ?F)]   Pulse:  [68]   Respirations:  [16 PER MINUTE]   SpO2:  [97 %]   O2 Device: None (Room air)     GEN: Alert, cooperative, no acute distress.  HEAD: Normocephalic, atraumatic  PULM: No respiratory distress.  ABD: Non-distended.  SKIN: No rashes noted.  EXT: No edema, no cyanosis.     Lab Review     Point of Care Testing  (Last 24 hours)  Glucose: (!) 144 (11/18/23 0629)  POC Glucose (Download): (!) 143 (11/18/23 0617)    Recent Labs     11/16/23  0647 11/17/23  0653 11/18/23  0629   NA 133* 133* 135*   K 5.2* 4.6 4.6   CL 98 98 100   CO2 25 26 25    GAP 10 9 10    BUN 32* 31* 35*   CR 0.92 0.86 0.98   GLU 112* 143* 144*   CA 10.0 9.8 9.6       Recent Labs     11/17/23  1214 11/18/23  0629   WBC 8.00 7.00   HGB 12.8* 12.3*   HCT 36.1* 34.9*   PLTCT 271 261      Estimated Creatinine Clearance: 61.1 mL/min (based on SCr of 0.98 mg/dL).  Vitals:    11/04/23 0401 11/11/23 9361 11/18/23 0500   Weight: 63.7 kg (140 lb 6.9 oz) 66 kg (145 lb 8.1 oz) 65.3 kg (143 lb 15.4 oz)        Thyroid Studies    Lab Results  Component Value Date/Time    TSH 1.02 11/15/2023 06:30 AM    No results found for: DERINDA DIRE, [facility] Pertinent radiology images reviewed.    Rockey CHRISTELLA Senters, DO   Endocrine Faculty  11/18/2023

## 2023-11-18 NOTE — Unmapped
 Patient/family declined:  Other: feeds, insulin (glucose checks),straight cath.    Patient/family educated on importance of intervention to their safety/quality of care. Patient/family continues to decline care.    Reason Why Patient/Family declined:  patient had a decrease in mood through out shift that increased his level of refusal.    Individualized safety and/or care plan implemented. If additional safety measures implemented, please list them.     Escalated to:  Provider: Dr Laurance.

## 2023-11-18 NOTE — Progress Notes
 OCCUPATIONAL THERAPY  NOTE   Name: Kevin Obrien   MRN: [id number]     DOB: Sep 09, 1950      Age: 73 y.o.  Admission Date: 10/28/2023     LOS: 21 days     Date of Service: 11/18/2023       11/18/23 1300   Type of Note   Type of Note Daily   Time Calculation   Start Time 1300   Stop Time 1320   Time Calculation 20   Missed Minutes 25 Minutes   Reasons for Missed Minutes Refused   Plan to Make Up Missed Minutes Will attempt to make up later today - if not able to, will add to tomorrows schedule   $$ Therapeutic Activity 1 unit - 15 min   Subjective   Subjective Pt seated wheelchair on arrival and initially agreeable to OT. Pt agreeable to change into clean clothes, however once therapist removed blanket, pt declining to participate in therapy reporting come see me tomorrow and  I have something to do tonight. Pt then declining to get back into bed despite reporting he is tired and would like to lay down to rest. Therapist updating RN and physician. Hand off with RN on exit   Precautions   Comments L sided weakness, visual impairment (horizontal gaze palsy), PEG   Cognitive   Patient Behavior Uncooperative;Irritable   Cognition Poor Safety Awareness   Assessment   Weekly OT Progress Min to Mod assist for bathing and UB dressing, supervision for toothbrushing   Weekly OT Barriers/Concerns total assist for LB dressing and footwear, varying assist and participation, impaired safety awareness   Plan   OT Plan LUE NMR, BUE coordination/strengthening, visual scanning, sitting/standing balance, ADL participation   Recommendations   OT Discharge Recommendations Consistent supervision;Skilled nursing facility   OT Equipment Recommendations Tub transfer bench   Short Term Goals (Weekly)   ADL Goals Toileting;Footwear;Lower body dressing   Patient Will Perform Toileting With Partial/moderate assistance;Progressing   Patient Will Perform Footwear With Partial/moderate assistance;Progressing   Patient Will Perform Lower Body Dressing With Partial/moderate assistance;Progressing   Self-Care Transfers All self-care transfers   Patient Will Perform All Self-Care Transfers With Partial/moderate assistance;Progressing   Overall Team Goal   Patient Will Perform Basic Cares With Partial/moderate assistance;Ongoing         Therapist: Phyliss Quale, OTD, OTR/L  Date: 11/18/2023

## 2023-11-19 LAB — POC GLUCOSE
~~LOC~~ BKR POC GLUCOSE: 260 mg/dL — ABNORMAL HIGH (ref 70–100)
~~LOC~~ BKR POC GLUCOSE: 147 mg/dL — ABNORMAL HIGH (ref 70–100)
~~LOC~~ BKR POC GLUCOSE: 159 mg/dL — ABNORMAL HIGH (ref 70–100)
~~LOC~~ BKR POC GLUCOSE: 186 mg/dL — ABNORMAL HIGH (ref 70–100)
~~LOC~~ BKR POC GLUCOSE: 191 mg/dL — ABNORMAL HIGH (ref 70–100)

## 2023-11-19 LAB — CULTURE-URINE W/SENSITIVITY: ~~LOC~~ BKR URINE CULTURE: >10 — AB

## 2023-11-19 MED ORDER — MIRTAZAPINE 15 MG PO TAB
15 mg | Freq: Every evening | ORAL | 0 refills | Status: DC
Start: 2023-11-19 — End: 2023-11-22
  Administered 2023-11-20 – 2023-11-21 (×3): 15 mg via ORAL

## 2023-11-19 MED ORDER — CEFTRIAXONE INJ 2GM IVP
2 g | INTRAVENOUS | 0 refills | Status: DC
Start: 2023-11-19 — End: 2023-11-21
  Administered 2023-11-19 – 2023-11-21 (×3): 2 g via INTRAVENOUS

## 2023-11-19 MED ORDER — MODAFINIL 100 MG PO TAB
200 mg | Freq: Every day | ORAL | 0 refills | Status: DC
Start: 2023-11-19 — End: 2023-11-22
  Administered 2023-11-20 – 2023-11-21 (×2): 200 mg via ORAL

## 2023-11-19 MED ORDER — MODAFINIL 100 MG PO TAB
100 mg | Freq: Every day | ORAL | 0 refills | Status: DC
Start: 2023-11-19 — End: 2023-11-22
  Administered 2023-11-19 – 2023-11-21 (×3): 100 mg via ORAL

## 2023-11-19 MED ORDER — SODIUM ZIRCONIUM CYCLOSILICATE 10 GRAM PO PWPK
10 g | Freq: Once | ORAL | 0 refills | Status: CP
Start: 2023-11-19 — End: ?
  Administered 2023-11-19: 14:00:00 10 g via ORAL

## 2023-11-19 MED ORDER — SODIUM CHLORIDE 0.9% IV SOLP
500 mL | Freq: Once | INTRAVENOUS | 0 refills | Status: DC
Start: 2023-11-19 — End: 2023-11-20

## 2023-11-19 MED ADMIN — WATER FOR INJECTION, STERILE IJ SOLN [79513]: 20 mL | INTRAMUSCULAR | @ 16:00:00 | Stop: 2023-11-19 | NDC 00409488717

## 2023-11-19 NOTE — Progress Notes
 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     Glucose, POC   Date/Time Value Ref Range Status   11/19/2023 1228 191 (H) 70 - 100 mg/dL Final   93/89/7974 9348 159 (H) 70 - 100 mg/dL Final   93/89/7974 9382 147 (H) 70 - 100 mg/dL Final   93/90/7974 7963 260 (H) 70 - 100 mg/dL Final   93/90/7974 9382 143 (H) 70 - 100 mg/dL Final   93/91/7974 7943 312 (H) 70 - 100 mg/dL Final   93/91/7974 8194 182 (H) 70 - 100 mg/dL Final   93/91/7974 8756 140 (H) 70 - 100 mg/dL Final   93/94/7977 7993 158 (H) 70 - 100 MG/DL Final   93/94/7977 8081 257 (H) 70 - 100 MG/DL Final   93/94/7977 8242 397 (H) 70 - 100 MG/DL Final   89/88/7978 8567 105 (H) 70 - 100 MG/DL Final   90/85/7979 8280 129 (H) 70 - 100 MG/DL Final   90/85/7979 9340 126 (H) 70 - 100 MG/DL Final   90/85/7979 9655 182 (H) 70 - 100 MG/DL Final   88/78/7980 9048 266  Final     EN Recs: Bolus Feeds of Jevity 1.5/Isosource 1.5 x5 cartons daily; administered as 1.25 cartons ( ) QID.     Recommendations:     Glucose trends reviewed. Remains at goal. Mild high again last night, wonder if this is related to trials of po intake.     Lantus 16 units daily  Novolog 17 units qid with bolus TF, hold if tube feeds are stopped or held  Novolog mid-dose CF, ACHS      Thank you for this consult, we will follow.  Please page me directly during 8am-5pm for questions, or the on-call endocrine fellow after 5pm.     Rockey Senters DO  Assistant Professor  Endocrinology, Diabetes & Metabolism  Pager: [phone] or available on Voalte     History of Present Illness    Kevin Obrien is a 73 y.o. year old male.  Resting in bed, no acute events, glucose trends reviewed.   Continues ongoing work with SLP, up to po trials of thin/mildly thick liquids and pureed solids    Estimated Creatinine Clearance: 44.7 mL/min (A) (based on SCr of 1.35 mg/dL (H)).    Allergies     Allergies   Allergen Reactions    Seasonal Allergies RHINITIS       Medications   Scheduled Meds:acetaminophen (TYLENOL) tablet 650 mg, 650 mg, Feeding Tube, Q6H*  amLODIPine (NORVASC) tablet 5 mg, 5 mg, Per NG tube, QDAY  artificial tears (PF) single dose ophthalmic solution 2 drop, 2 drop, Both Eyes, TID  aspirin chewable tablet 81 mg, 81 mg, Per NG tube, QDAY  cefTRIAXone (ROCEPHIN) IVP 2 g, 2 g, Intravenous, Q24H*  clopiDOGreL (PLAVIX) tablet 75 mg, 75 mg, Per NG tube, QDAY  diclofenac sodium (VOLTAREN) 1 % topical gel 2 g, 2 g, Topical, QID  Diet Enteral Feeding Bolus, , SEE ADMIN  INSTRUCTIONS, QID (3,87,83,79)   And  Water Bolus, 30 mL, PEG Tube, QID (3,87,83,79)  doxazosin (CARDURA) tablet 4 mg, 4 mg, Feeding Tube, QHS  enoxaparin (LOVENOX) syringe 40 mg, 40 mg, Subcutaneous, QDAY(21)  insulin aspart (U-100) (NOVOLOG FLEXPEN U-100 INSULIN) injection PEN 0-12 Units, 0-12 Units, Subcutaneous, QID  insulin aspart (U-100) (NOVOLOG FLEXPEN U-100 INSULIN) injection PEN 17 Units, 17 Units, Subcutaneous, QID  insulin glargine (LANTUS SOLOSTAR U-100 INSULIN) injection PEN 16 Units, 16 Units, Subcutaneous, QDAY  lidocaine (LIDODERM) 5 % topical patch 1 patch, 1 patch, Topical, QDAY  lisinopriL (ZESTRIL) tablet 20 mg, 20 mg, Feeding Tube, QDAY  melatonin tablet 5 mg, 5 mg, Feeding Tube, QHS  mirtazapine (REMERON) tablet 15 mg, 15 mg, Oral, QHS  [START ON 11/20/2023] modafiniL (PROVIGIL) tablet 200 mg, 200 mg, Oral, QDAY   And  modafiniL (PROVIGIL) tablet 100 mg, 100 mg, Oral, QDAY  petrolatum (STYE) ophthalmic ointment 0.25 inch, 0.25 inch, Both Eyes, TID  propranoloL (INDERAL) tablet 10 mg, 10 mg, Per NG tube, BID  rosuvastatin (CRESTOR) tablet 40 mg, 40 mg, Feeding Tube, QDAY  thiamine mononitrate (vit B1) tablet 100 mg, 100 mg, Per NG tube, QDAY    Continuous Infusions:  PRN and Respiratory Meds:bisacodyL QDAY PRN, dextrose 50% (D50) IV PRN, diclofenac sodium QID PRN, lidocaine hcl PRN, loperamide TID PRN, oxyCODONE Q12H PRN, pancrelipase 20,880 Units/sodium bicarbonate 650 mg ([place] CLOG DESTROYER) PRN (On Call from Rx), phenoL PRN, trazodone(#) BID PRN      Physical Examination                          Vital Signs: Last                  Vital Signs: 24 Hour Range   BP: 108/43 (06/10 0912)  Temp: 36.8 ?C (98.3 ?F) (06/10 9451)  Pulse: 68 (06/10 0912)  Respirations: 16 PER MINUTE (06/10 0548)  SpO2: 96 % (06/10 0548)  O2 Device: None (Room air) (06/10 0548) BP: (108-127)/(43-50)   Temp:  [36.8 ?C (98.3 ?F)]   Pulse:  [68-79]   Respirations:  [16 PER MINUTE]   SpO2:  [96 %]   O2 Device: None (Room air)     GEN: Alert, cooperative, no acute distress.  HEAD: Normocephalic, atraumatic  PULM: No respiratory distress.  ABD: Non-distended.  SKIN: No rashes noted.  EXT: No edema, no cyanosis.     Lab Review     Point of Care Testing  (Last 24 hours)  Glucose: (!) 133 (11/19/23 0618)  POC Glucose (Download): (!) 191 (11/19/23 1228)    Recent Labs     11/17/23  0653 11/18/23  0629 11/19/23  0618   NA 133* 135* 135*   K 4.6 4.6 5.5*   CL 98 100 99   CO2 26 25 27    GAP 9 10 9    BUN 31* 35* 47*   CR 0.86 0.98 1.35*   GLU 143* 144* 133*   CA 9.8 9.6 9.5       Recent Labs     11/17/23  1214 11/18/23  0629   WBC 8.00 7.00   HGB 12.8* 12.3*   HCT 36.1* 34.9*   PLTCT 271 261      Estimated Creatinine Clearance: 44.7 mL/min (A) (based on SCr of 1.35 mg/dL (H)).  Vitals:    11/11/23 0638 11/18/23 0500 11/19/23 0548   Weight: 66 kg (145 lb 8.1 oz) 65.3 kg (143 lb  15.4 oz) 65.8 kg (145 lb 1 oz)        Thyroid Studies    Lab Results   Component Value Date/Time    TSH 1.02 11/15/2023 06:30 AM    No results found for: DERINDA DIRE, [facility]     Pertinent radiology images reviewed.    Rockey CHRISTELLA Senters, DO   Endocrine Faculty  11/19/2023

## 2023-11-19 NOTE — Progress Notes
 Acute Inpatient Rehabilitation Behavior Summary of Shift    Name: Kevin Obrien   MRN: [id number]     DOB: 07-14-1950      Age: 73 y.o.  Admission Date: 10/28/2023     LOS: 22 days     Date of Service: 11/19/2023      Reason for increased observation? Poor safety awareness, impulsivity  One-on-one safety watch: No  Q15-30 minute safety watch: No  Suicide precaution: No  Telemonitoring: Yes    Behavior outbursts during this shift? No  Description and time(s) of event: n/a   Agitation during this shift? No  Description and time(s) of event: n/a      Active suicide ideation during this shift? No   Description and time(s) of event: n/a    Poor safety awareness or impulsivity during this shift? No  Description and time(s) of event: n/a    Noteworthy visitor interactions during this shift? Patient's daughter was here to visit at the start of the shift,left around 0100.  Sleep/rest patterns observed during this shift? Patient slept well during the shift, easily awaken for cares, and goes back to sleep right after.  Therapeutic interventions:    Redirection and Reorientation  Additional Comments: na

## 2023-11-19 NOTE — Progress Notes
 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  male admitted to [facility] on 10/28/2023 with the following issues: stroke      Rehabilitation Plan  Tentative discharge date: 11/22/2023  Rehabilitation: Patient will continue with comprehensive therapies at modified therapy schedule including physical therapy, occupational therapy, speech & language pathology, specialized rehab nursing, neuropsychology and physiatry oversight.    Goals:  Patient Will Perform Basic Cares With: Partial/moderate assistance, Ongoing  Patient Will Perform Household Mobility With: Partial/moderate assistance, At wheelchair level  Recommended therapy after discharge: Consistent assistance, Home setting with home health, Versus, Skilled nursing facility  PT recommended equipment: Too early to determine (lightweight vs custom wheelchair if dc home)  OT recommended equipment: Tub transfer bench    Daily Functional Update:  Stand to Sit Assist Level: Partial/moderate assistance (11/18/2023 10:00 AM)  Equipment: Standard wheelchair (vertical rail) (11/18/2023 10:00 AM)     Stand Pivot Assist Level: Partial/moderate assistance (11/16/2023  2:30 PM)  Equipment: Hand hold assist (11/16/2023  2:30 PM)     Slideboard No data recorded  No data recorded   Gait Distance: 20 + 40 (11/18/2023 10:00 AM)    Assist Level: Dependent (11/18/2023 10:00 AM)    Equipment: Wheelchair follow (vertical rails along fence on rooftop) (11/18/2023 10:00 AM)     Wheelchair Distance 1: 50x2 (11/18/2023 10:00 AM)    Assist Level: Partial/moderate assistance (11/18/2023 10:00 AM)     Toileting       Assist Level: Dependent (11/13/2023  2:45 PM)  Equipment: -- Jayson Steady) (11/07/2023 10:10 AM)     Toilet Transfer No data recorded   Upper Body Dressing Assist Level: Substantial/maximal assistance (11/16/2023  9:45 AM)     Lower Body Dressing Assist Level: Dependent (11/16/2023  9:45 AM)       Diet: PO: Ice chips only (11/07/2023  1:30 PM)      Orientation Log & Cognitive Log Scores for the past 72 hrs (Last 3 readings):   Orientation Log Score (Out of 30)   11/18/23 1045 7/30   11/16/23 1430 5/30  Current Medical Problems/Risks of Medical Complications/Management    Acute Right Pontomedullary Ischemic Stroke  Proximal Right Intracranial Vertebral Artery Occlusion  Diffuse Intracranial Atherosclerosis  Bilateral Horizontal Gaze Palsy  History of Forced Left Gaze Deviation  Left Facial Palsy  Impairments: left hemiparesis, expressive aphasia, oropharyngeal dysphagia, right facial weakness, left facial droop with dysarthria, diplopia, concern for attention/cognitive impairments, LUE ataxia, neurogenic bowel, neurogenic bladder  Impaired mobility and ADL, impaired swallow and cognition/communication               > consult PT/OT/SLP and neuropsychology to evaluate and treat  > Agitation prevention: Metoprolol switched to propranolol 10MG  bid.  Working on improved sleep/wake cycle with adjustments to modafinil (increase AM dose to 200mg  at 0530, 100mg  1000 6/10) and Remeron.  > Agitation abortive: Trazodone 12.5 mg as needed.  If second line is needed, will reach out to psychiatry regarding secondary agent such as Seroquel or increasing trazodone as his Qtc has improved on 6/5 EKG.                   > Sleep optimization: melatonin 5mg  qhs, previously on seroquel nightly               > follow up: Neurology, PM&R, Cardiology     Secondary Stroke Risk Optimization  > Antiplatelet/Anticoagulation: DAPT with aspirin 81mg  daily and clopidogrel 75mg  daily for 90 days followed by aspirin 81mg  indefinitely  > Blood Pressure Goal: <160 during acute hospitalization, long term goal of < 130/80               - Current antihypertensive medications: amlodipine 5mg  daily, lisinopril 20mg  daily, propranolol 10mg  BID (started 6/5)  > Lipids: LDL is 161, with goal of <70: rosuvastatin 40mg  daily (per Cardiology recs)  > Blood Glucose: Hgb A1c is 11.7%, with goal of <7  > Tobacco abstinence goal     Urinary tract infection  Neurogenic Bladder with Retention       -Post-Void (PVR) Bladder Scan (mL): 304 milliliters (11/15/2023  9:10 AM)  -Straight Cath (mL): 300 (11/19/2023  6:20 AM)  - One episode of self-void 5/28; doxazosin increased to 3mg   - s/p bactrim 160mg  bid 5/31-6/1 - urine cx +e. Coli, sensitive to bactrim  -Patient with worsening cloudy urine 6/7, urinalysis obtained 6/8 notable for 3+ leukocytes, Bactrim restarted for a nongated course of 5-7 days.  Culture pending  > Monitor urinary retention per unit protocol. If unable to void or PVRs >300cc perform clean ISC (intermittent straight catheterization) for volumes > . Stop checking BVIs if 3 consecutive volumes <150cc.   > Discuss foley placement vs ISC following treatment of UTI / improvement in mood and participation (pt was noted to be pulling on PEG tube, will not place foley at this time in case he does so with foley catheter)  > Doxazosin 4mg  QHS (last increase 6/3)  > Bactrim started 6/8 - cx e.coli, resistent to bactrim. Start Ceftriaxone (6/10-6/12).  > Monitor urine culture    AKI  Hyperkalemia  - Cr 1.35 from 0.98 6/10  - K- 5.5 6/10   > monitor daily BMP   > s/p lokelma x1 6/10   > consult IM for assistance with AKI management    Suicidal Ideation  Pseudobulbar Affect  Depression  - family denies knowledge of previous or current SI  - Per Neurology: Consider starting Dextromethorphan-Quinidine in the outpatient setting (unfortunately unavailble on the formulary inpatient) for Pseudobulbar affect, will start Fluoxetine 20mg  in  the meantime --> instead was started on Sertraline 50mg , will continue while in rehab  - Per neuropsychology eval on 5/28: Pt?s reported mood was sad. Pt endorsed sadness and passive SI and also made a gesture of shooting himself. He reported that the last time he thought about it was yesterday.    > neuropsychology and psychiatry following   > Neurology clinic follow up on dc  > Appreciate Psychiatry Recommendations-    -- EKG 6/5- Qtc <470.   -- Continue Mirtazapine 7.5mg  QHS for mood, sleep, and appetite   -- Trazodone 12.5mg  BID PRN for agitation - discuss with psych possibly scheduling trazodone at night for improved sleep   -- Pt denies SI-okay to discontinue CO, transition to one-to-one safety watch in the setting of impulsivity and elopement risk as indicated     Dysphagia s/p PEG tube placement  Moderate Malnutrition  - PEG tube placement 5/15               > Dietician consult, appreciate tube feed recommendations- transition from continuous to bolus feeds 5/19. Appreciate most updated nutrition recommendations, 5/27:  > ADAT to least restrictive per SLP; currently strict NPO with ice chips only when brightly alert   > EN Recs: Bolus Feeds of Jevity 1.5 x5 cartons daily; administered as 1 + 1/4 cartons ( ) QID  Provides 1778kcal, 76g protein, 901mL free fluids  Recommend at minimum water flushes of 30mL before and after each bolus feed for tube patency.   Additional fluids per primary team, with consideration for water boluses of 100mL before and after bolus feeds (provides additional 800mL daily for total fluids of 1755ml/d)  > Per videoswallow study 5/29- silently aspirated thin liquids and penetrated mildly thick liquids, too much oral weakness/uncoordination to consider dysphagia diet safely at this point; continue NPO at this time, continue to encourage ice chips throughout the day under supervision    Left Bell's Palsy  Discharge of the left eye - Improved  Left TMJ pain - Resolved  Ear Wax B/L ear - Improved  - s/p 1 week debrox               > Alternate eye patches; eyepatch for the L eye overnight, artificial tears TID daily in the setting of left facial palsy- increase drops to the left eye in the setting of L eye discharge. Monitor for ssx of infection to the left eye and consider erythromycin ointment if infection suspected   > lidocaine patch and tylenol to be continued as pt reports these improve left sided jaw pain    Oral Thrush - Resolved               > oral care TID   > Nystatin swish and spit started 5/20, ok'd by SLP - d/c'd 6/9    Daytime Sleepiness - Improved  Fatigue - Improved  Increased Somnolence - Resolved  - Concern for increased somnolence 5/20 leading to Neurology Consult- rec CTA H&N; discussed with team may attempt to liberalize BP I.e. decreasing amlodipine to 5mg  due to symptoms possibly 2/2 to hypoperfusion  - CTA Head and Neck - negative for acute change  - Infectious workup including UA, CXR- negative for acute concerns  > appreciate further neurology recommendations- if acute focal weakness, numbness, coma, etc. Stroke activate/send to ED as he is high risk to have progression of his stroke/basilar involvement. Signed off.   > monitor on modafinil BID - ongoing adjustments as noted above to optimize alertness  and participation though seems to be largely psych-related. Trial of modafinil 200mg  in the AM and 100mg  at ~1000 (new time change 6/10).    Bowel Management  At risk for Neurogenic Bowel  Loose stools   Constipation  -Last Bowel Movement Date: 11/18/23 (11/18/2023  8:35 PM)  - Goal for a daily BM or every 24-48 hours.   - c diff negative 5/18  > adjust bowel regimen PRN   > loperamide PRN     Hypertension  Orthostatic Hypotension  LVOT Obstruction  PFO  CAD s/p PCI               > metoprolol 25mg  BID - change to propranolol 10mg  BID 6/5               > amlodipine 5mg  daily, lisinopril 20mg  daily               > 30 day MCOT    > compression stockings to prevent sx orthostatic hypotension               > Follow up with outpatient Cardiology      Diabetes Mellitus               > Endocrinology consult for ongoing discharge regimen planning    Aspiration Pneumonia - Resolved  S/p AHRF  - increased O2 req overnight 5/13 with elevated procal, tachycardia, leukocytosis  - CXR 5/13 notable for mild basal opacities, favored to represent atelectasis  - s/p vanc x1 and zosyn x3 days  - sputum cx NGTD  - Augmentin to complete total 7 day course of antibiotics through 5/20 AM    Nutrition:   Body mass index is 24.14 kg/m?SABRA  Malnutrition Details:  Malnutrition present on admission  ICD-10 code E44: Chronic illness/Moderate non-severe malnutrition      Mild loss of muscle mass, Mild loss of body fat, Energy intake: Less than 75% of estimated energy requirement for 1 month or more        Loss of Subcutaneous Fat: Yes Mild Orbital, Triceps  Muscle Wasting: Yes Moderate Clavicle, Temple, Deltoid               Malnutrition Interventions: Continue to provide EN to meet 100% estimated protein and kcal needs    Wounds:          DVT ppx: enoxaparin (LOVENOX) syringe 40 mg  QDAY(21); patient will not require at the time of discharge    Code Status: Full Code    Current Medications:  Scheduled Meds:acetaminophen (TYLENOL) tablet 650 mg, 650 mg, Feeding Tube, Q6H*  amLODIPine (NORVASC) tablet 5 mg, 5 mg, Per NG tube, QDAY  artificial tears (PF) single dose ophthalmic solution 2 drop, 2 drop, Both Eyes, TID  aspirin chewable tablet 81 mg, 81 mg, Per NG tube, QDAY  clopiDOGreL (PLAVIX) tablet 75 mg, 75 mg, Per NG tube, QDAY  diclofenac sodium (VOLTAREN) 1 % topical gel 2 g, 2 g, Topical, QID  Diet Enteral Feeding Bolus, , SEE ADMIN INSTRUCTIONS, QID (3,87,83,79)   And  Water Bolus, 30 mL, PEG Tube, QID (3,87,83,79)  doxazosin (CARDURA) tablet 4 mg, 4 mg, Feeding Tube, QHS  enoxaparin (LOVENOX) syringe 40 mg, 40 mg, Subcutaneous, QDAY(21)  insulin aspart (U-100) (NOVOLOG FLEXPEN U-100 INSULIN) injection PEN 0-12 Units, 0-12 Units, Subcutaneous, QID  insulin aspart (U-100) (NOVOLOG FLEXPEN U-100 INSULIN) injection PEN 17 Units, 17 Units, Subcutaneous, QID  insulin glargine (LANTUS SOLOSTAR U-100 INSULIN) injection PEN 16 Units, 16 Units,  Subcutaneous, QDAY  lidocaine (LIDODERM) 5 % topical patch 1 patch, 1 patch, Topical, QDAY  lisinopriL (ZESTRIL) tablet 20 mg, 20 mg, Feeding Tube, QDAY  melatonin tablet 5 mg, 5 mg, Feeding Tube, QHS  mirtazapine (REMERON) tablet 7.5 mg, 7.5 mg, Oral, QHS  modafiniL (PROVIGIL) tablet 200 mg, 200 mg, Oral, QDAY   And  modafiniL (PROVIGIL) tablet 100 mg, 100 mg, Oral, QDAY  petrolatum (STYE) ophthalmic ointment 0.25 inch, 0.25 inch, Both Eyes, TID  propranoloL (INDERAL) tablet 10 mg, 10 mg, Per NG tube, BID  rosuvastatin (CRESTOR) tablet 40 mg, 40 mg, Feeding Tube, QDAY  sodium zirconium cyclosilicate (LOKELMA) packet 10 g, 10 g, Oral, ONCE  thiamine mononitrate (vit B1) tablet 100 mg, 100 mg, Per NG tube, QDAY    Continuous Infusions:  PRN and Respiratory Meds:bisacodyL QDAY PRN, dextrose 50% (D50) IV PRN, diclofenac sodium QID PRN, lidocaine hcl PRN, loperamide TID PRN, oxyCODONE Q12H PRN, pancrelipase 20,880 Units/sodium bicarbonate 650 mg ([place] CLOG DESTROYER) PRN (On Call from Rx), phenoL PRN, trazodone(#) BID PRN      Subjective     No acute overnight events. Pt seen at bedside, seemingly asleep however visualize movement of lower extremities spontaneously noted. Allowing nursing team to proceed with cares. Last bowel movement 6/9. Will adjust modafinil timing. Intermittently participating with therapy and nursing cares.     Objective                          Vital Signs: Last Filed                 Vital Signs: 24 Hour Range   BP: 127/46 (06/10 0548)  Temp: 36.8 ?C (98.3 ?F) (06/10 9451)  Pulse: 74 (06/10 0548)  Respirations: 16 PER MINUTE (06/10 0548)  SpO2: 96 % (06/10 0548)  O2 Device: None (Room air) (06/10 0548) BP: (124-127)/(46-50)   Temp:  [36.8 ?C (98.3 ?F)]   Pulse:  [74-79]   Respirations:  [16 PER MINUTE]   SpO2:  [96 %]   O2 Device: None (Room air)     Vitals:    11/11/23 0638 11/18/23 0500 11/19/23 0548   Weight: 66 kg (145 lb 8.1 oz) 65.3 kg (143 lb 15.4 oz) 65.8 kg (145 lb 1 oz)       Intake/Output Summary:  (Last 24 hours)    Intake/Output Summary (Last 24 hours) at 11/19/2023 0845  Last data filed at 11/19/2023 0620  Gross per 24 hour   Intake 1416 ml   Output 1000 ml   Net 416 ml        Last Bowel Movement Date: 11/18/23    Labs--reviewed  Results for orders placed or performed during the hospital encounter of 10/28/23 (from the past 24 hours)   POC GLUCOSE    Collection Time: 11/18/23  8:36 PM   # # Low-High    Glucose, POC 260 (H) 70 - 100 mg/dL   POC GLUCOSE    Collection Time: 11/19/23  6:17 AM   # # Low-High    Glucose, POC 147 (H) 70 - 100 mg/dL   BASIC METABOLIC PANEL CELLULAR THERAPEUTICS    Collection Time: 11/19/23  6:18 AM   # # Low-High    Sodium 135 (L) 137 - 147 mmol/L    Potassium 5.5 (H) 3.5 - 5.1 mmol/L    Chloride 99 98 - 110 mmol/L    Glucose 133 (H) 70 - 100 mg/dL    Blood  Urea Nitrogen 47 (H) 7 - 25 mg/dL    Creatinine 8.64 (H) 0.40 - 1.24 mg/dL    Calcium 9.5 8.5 - 89.3 mg/dL    CO2 27 21 - 30 mmol/L    Anion Gap 9 3 - 12    Glomerular Filtration Rate (GFR) 55 (L) >60 mL/min   POC GLUCOSE    Collection Time: 11/19/23  6:51 AM   # # Low-High    Glucose, POC 159 (H) 70 - 100 mg/dL         Physical Exam  General: No acute distress, alert elderly male lying in bed  HEENT: NCAT  Heart: Extremities warm and well perfused  Lungs: Normal work of breathing on room air  Abdomen: +Gtube  Extremities: +PIV on RUE  Psych: pleasant and interactive this AM  Neuro: Seemingly asleep- spontaneous movement of bilateral lower extremities noted however not opening eyes to name or questions. Previous exam notable for: Bilateral shoulder abduction at least antigravity. Coordinated movements with fist bump. Noted ongoing left facial paralyis and bilateral horizontal gaze palsy.    Labs & Therapy Notes Reviewed    Corean CHRISTELLA Brewster, DO   Physical Medicine & Rehabilitation  For patient care questions contact Rehab Medicine 3 FIRST CALL via Voalte.

## 2023-11-19 NOTE — Progress Notes
 Psychiatric Consultation Progress Note    LOS: 22 days    Current Psychotropic Meds:   Mirtazapine 7.5 mg per G tube Q HS  Trazodone 12.5 mg per G tube BID PRN for agitation  Modafinil 100 mg per G tube BID    Psychiatric Assessment:  Adjustment disorder with mixed anxiety and depressed mood     Relevant Medical Problems:  Acute ischemic stroke, right pontomedullary  Expressive aphasia    Recommendations:  Increase mirtazapine from 7.5 mg to 15 mg QHS for mood, sleep, and appetite (ordered).  Continue trazodone 12.5 mg BID PRN for agitation  Continue modafinil 200 mg Q AM and modafinil 100 mg Q afternoon for daytime alertness   Patient denies SI and does not have a plan or intention of harming himself or ending his own life. Continue telesitter per nursing discretion in the setting of impulsivity and elopement risk.   Neurorehabilitation Psychology consulted. Agree with and appreciate recommendations.  Psychiatry will continue to follow    Delirium recommendations:  - Avoid benzodiazepines, opiates and anticholinergics if possible as can contribute/cause delirium.   - Frequent reorientation, use of clocks/calendars, verbal reminders of current location and situation.   - A calm, comfortable environment that includes familiar objects from home.   - Involvement of family members.   - Uninterrupted periods of sleep at night, with low levels of noise and minimal light.   - Open blinds during the day to promote daytime alertness and a regular sleep-wake cycle    Reviewed EMR.  Reviewed Laboratory studies.       [name] Baseman, DNP, APRN, PMHNP-BC, PMGT-BC  Nurse Practitioner, Psychiatry  Available via Volate    Please feel free to contact us  with any additional questions or concerns by paging the consult team between 8am and 5pm on weekdays and between 8am and 3pm on weekends at [phone]. Otherwise, page the psychiatry resident on call.    --------------------------------------------------------------------------------------------------------------------------------  Subjective:  Kevin Obrien is a 73 year-old male seen at the bedside for follow-up evaluation. Patient is awake, resting in bed. His behavior is calm and cooperative. He is oriented to person only. Patient endorses that his mood is good and has been really good for a while. He denies feeling depressed or anxious today, however, notes reflect that he did exhibit frustrations and sadness yesterday with his family and staff. Patient believes that he slept well overnight. He denies SI/HI/AVH.     Review of Systems:  Unable to assess due to patient factors    Objective:                   Vital Signs:  Current                Vital Signs: 24 Hour Range   BP: 126/52 (06/10 2214)  Temp: 36.5 ?C (97.7 ?F) (06/10 1500)  Pulse: 85 (06/10 2214)  Respirations: 16 PER MINUTE (06/10 0548)  SpO2: 97 % (06/10 1500)  O2 Device: None (Room air) (06/10 1500) BP: (108-127)/(43-52)   Temp:  [36.5 ?C (97.7 ?F)-36.8 ?C (98.3 ?F)]   Pulse:  [68-85]   Respirations:  [16 PER MINUTE]   SpO2:  [96 %-97 %]   O2 Device: None (Room air)    Intensity Pain Scale (Self Report): (not recorded)      Scheduled Medications:  acetaminophen (TYLENOL) tablet 650 mg, 650 mg, Feeding Tube, Q6H*  amLODIPine (NORVASC) tablet 5 mg, 5 mg, Per NG tube, QDAY  artificial tears (PF)  single dose ophthalmic solution 2 drop, 2 drop, Both Eyes, TID  aspirin chewable tablet 81 mg, 81 mg, Per NG tube, QDAY  cefTRIAXone (ROCEPHIN) IVP 2 g, 2 g, Intravenous, Q24H*  clopiDOGreL (PLAVIX) tablet 75 mg, 75 mg, Per NG tube, QDAY  diclofenac sodium (VOLTAREN) 1 % topical gel 2 g, 2 g, Topical, QID  Diet Enteral Feeding Bolus, , SEE ADMIN INSTRUCTIONS, QID (3,87,83,79)   And  Water Bolus, 30 mL, PEG Tube, QID (3,87,83,79)  doxazosin (CARDURA) tablet 4 mg, 4 mg, Feeding Tube, QHS  enoxaparin (LOVENOX) syringe 40 mg, 40 mg, Subcutaneous, QDAY(21)  insulin aspart (U-100) (NOVOLOG FLEXPEN U-100 INSULIN) injection PEN 0-12 Units, 0-12 Units, Subcutaneous, QID  insulin aspart (U-100) (NOVOLOG FLEXPEN U-100 INSULIN) injection PEN 17 Units, 17 Units, Subcutaneous, QID  insulin glargine (LANTUS SOLOSTAR U-100 INSULIN) injection PEN 16 Units, 16 Units, Subcutaneous, QDAY  lidocaine (LIDODERM) 5 % topical patch 1 patch, 1 patch, Topical, QDAY  melatonin tablet 5 mg, 5 mg, Feeding Tube, QHS  mirtazapine (REMERON) tablet 15 mg, 15 mg, Oral, QHS  [START ON 11/20/2023] modafiniL (PROVIGIL) tablet 200 mg, 200 mg, Oral, QDAY   And  modafiniL (PROVIGIL) tablet 100 mg, 100 mg, Oral, QDAY  petrolatum (STYE) ophthalmic ointment 0.25 inch, 0.25 inch, Both Eyes, TID  propranoloL (INDERAL) tablet 10 mg, 10 mg, Per NG tube, BID  rosuvastatin (CRESTOR) tablet 40 mg, 40 mg, Feeding Tube, QDAY  sodium chloride 0.9% infusion, 500 mL, Intravenous, ONCE  thiamine mononitrate (vit B1) tablet 100 mg, 100 mg, Per NG tube, QDAY        PRN Medications:  bisacodyL QDAY PRN, dextrose 50% (D50) IV PRN, diclofenac sodium QID PRN, lidocaine hcl PRN Given at 11/03/23 0013, loperamide TID PRN 2 mg at 11/16/23 2255, oxyCODONE Q12H PRN 2.5 mg at 11/13/23 0238, pancrelipase 20,880 Units/sodium bicarbonate 650 mg ([place] CLOG DESTROYER) PRN (On Call from Rx), phenoL PRN 2 spray at 11/10/23 1834, trazodone(#) BID PRN 12.5 mg at 11/14/23 0028      Mental Status Exam:  General/Constitutional: appears stated age, good hygiene, dressed in personal attire  Speech/Motor: dysarthric, quiet volume  Mood/Affect:  good/restricted, mood incongruent   Thought Process: Limited communication  Thought Content: Denies SI/HI  Perception: Denies AVH, not observed to respond to internal stimuli  Insight/Judgment: Limited/limited    Orientation: Oriented to person only  Recent and Remote Memory: Limited recall  Attention span and concentration: Limited  Fund of knowledge and vocabulary: expressive aphasia     Focused Physical Exam:  Musculoskeletal: no obvious MSK abnormalities  Neuro: Bilateral gaze palsy, left facial weakness    [name] Baseman, DNP, APRN, PMHNP-BC, PMGT-BC  Nurse Practitioner, Psychiatry  Available via Volate    Total Time Today was 35 minutes in the following activities: Preparing to see the patient, Obtaining and/or reviewing separately obtained history, Performing a medically appropriate examination and/or evaluation, Counseling and educating the patient/family/caregiver, Documenting clinical information in the electronic or other health record, Independently interpreting results (not separately reported) and communicating results to the patient/family/caregiver, and Care coordination (not separately reported)

## 2023-11-19 NOTE — Progress Notes
 PHYSICAL THERAPY  NOTE      Name: Kevin Obrien   MRN: [id number]     DOB: August 04, 1950      Age: 73 y.o.  Admission Date: 10/28/2023     LOS: 22 days     Date of Service: 11/19/2023         11/19/23 1130   Type of Note   Type of Note Daily   Time Calculation   Start Time 1135   Stop Time 1155   Time Calculation 20   $$ PT Therapeutic Activity 1 unit   Subjective   Subjective Patient sitting in wheelchair upon arrival, requesting to get back in bed. Agreeable to attempt to use restroom for timed void before returning to bed. Supine with all needs met at end of encounter, RN updated.   Verbal Description Scale   Verbal Pain Description No Pain   Precautions   Comments L sided weakness, visual impairment (horizontal gaze palsy), PEG   Sit to Lying   Patient Needs Assistance With Trunk   Assist Level Partial/moderate assistance   Equipment No rails;HOB flat   Sit to and from Stand   Sit to and from Stand Sit to stand;Stand to sit   Sit to Stand   Patient Needs Assistance With Facilitation of trunk;Facilitation of weight shift;Balance   Assist Level Partial/moderate assistance   Equipment Grab bar;Hand hold assist   Stand to Sit   Patient Needs Assistance With Facilitation of weight shift;Balance;Extra time to complete activity;Financial planner;Facilitation of hip flexion;Facilitation of trunk;Cues for positioning of hands   Assist Level Partial/moderate assistance   Equipment Grab bar;Hand hold assist   Stand Pivot Transfer   Patient Needs Assistance With Extra time to complete activity;Financial planner;Facilitation of hip flexion;Facilitation of trunk;Facilitation of weight shift;Cues for positioning of hands;Cues for sequencing;Balance;Facilitation of hip extension   Assist Level Partial/moderate assistance   Equipment Grab bar;Hand hold assist   Comments toilet<>wheelchair>bed; min A with BUE support on grab bar, mod A with handhold assist   Toileting   Patient Needs Assistance With Adjusting clothing before toileting;Wiping self;Adjusting clothing after toileting;Balance   Assist Level Substantial/maximal assistance   Position Standing for pants management;Standing for wiping   Equipment Grab bar   Comments patient attempting posterior hygiene with wipes despite no output for cleanliness with UE support on grab bar and min A from PT for balance; alt UE support on grab bar when pulling up briefs, PT assisting in back   Toilet Transfer   Patient Needs Assistance With Facilitation of trunk;Facilitation of weight shift;Increased time to complete;Balance;Verbal cues;Transferring on;Transferring off   Transfer Type Stand pivot   Assist Level Substantial/maximal assistance   Equipment Grab bar   Daily Care   Bladder Management Comments attempted timed void, no output   Therapeutic Interventions   Therapeutic Activities timed toileting   Assessment   Assessment Patient with improved toilet hygiene and manging of brief, able to perform Ax1. Agreeable to session and motivated by returning to bed.   Plan   PT Plan gait training Ax2 (hand hold assist, vector to help with posture vs rifton), neurogym for higher reps (consider using mirror for visual feedback), standing/reaching activities to promote upright posture, trial stairs as appropriate, nustep for conditioning, outcome measures as appropriate, continued wc ultralight propulsion; standing at plinth activities   Recommendations   PT Discharge Recommendations Consistent assistance;Home setting with home health;Versus;Skilled nursing facility   PT Equipment Recommendations Too early to determine  (lightweight vs custom wheelchair  if dc home)         Therapist: Tereasa Free, PT, DPT  Date: 11/19/2023

## 2023-11-19 NOTE — Progress Notes
 SPEECH-LANGUAGE PATHOLOGY     11/19/23 0945   Behavior   Comments* Pt asleep in bed upon arrival of SLP. Initially awoke to auditory and tactile stimuli however resistent to start of therapy. SLP provided wait time and encouragement for participating in therapy. Pt eventually agreeable to PO trial of liquids this date. Nursing staff assisted with transfering pt from bed to wheelchair. Pt left seated in wheelchair watching TV with call light in reach and all needs met. Safety handoff completed in person with nursing staff.   Dysphagia Goals   Therapy Activities Swallow exercises;PO trials   Swallow exercises Effortful swallow   PO Trials Ice chips;Thin liquids;Mildly thick liquids   Therapy Activity Comments Pt participated in trials of ice chips x5, thin x7, and mildly thick liquids x3. Cough response x2 noted during trials of thick liquids however pt with hx of silent aspiration.     Conservative trials of mildly thick liquids provided this date 2/2 safety concerns. Slowed swallow initiation noted with mildly thick. Oral care completed prior and following trials given max encouragement.    Effortful swallow completed x5 during trials of liquids/ice chips. Attempted more repetitions of swallow however pt refused.   Orientation Goals   Therapy Activities Environmental prompts/cues   Therapy Activity Comments Reviewed orientation questions: date, place, and day of week to assist with orientation upon awaking. Pt verbalized yes repeatedly throughout review of orientation questions however did not attempt to repeat.   Problem Solving Goals   Therapy Activities Sequencing   Therapy Activity Comments Sequencing targeted with oral care prior to and following  PO trials. Initially, pt able to complete oral care with set up assistance and mod cues for next steps. Following PO trials, pt required max encouragement/cues for completing oral care.       Assessment Pt appeared to benefit from frequent breaks and reduced volume of voice for participating in therapy.   Plan Cognition:  -- O-log  -- Simple attention/memory  -- Visual scanning  -- Medication management  -- Finance management  -- Consider WAB-Bedside     Dysphagia:  -- PO trials thin/mildly thick liquids (consider trialing motivating drink choices to increase participation), pureed solids   -- EMST-Lite  -- Swallow exercises as able (effortful swallow, masako, lingual protrusion/holds)   -- Continue IOPI as able     [place] Hudsen Fei, M.A., CCC-SLP

## 2023-11-19 NOTE — Progress Notes
 Patient/family declined:  Other: Evening tube feed, medications charted in the [facility], pt refused help from nurses, read more in the behavioral note.    Patient/family educated on importance of intervention to their safety/quality of care. Patient/family continues to decline care.    Reason Why Patient/Family declined:  pt is confused and agitated.    Individualized safety and/or care plan implemented. If additional safety measures implemented, please list them.     Escalated to:  Unit coordinator/charge nurse and Provider: Dr Andria.

## 2023-11-19 NOTE — Progress Notes
 Acute Inpatient Rehabilitation Behavior Summary of Shift    Name: Kevin Obrien   MRN: [id number]     DOB: 01/27/1951      Age: 73 y.o.  Admission Date: 10/28/2023     LOS: 22 days     Date of Service: 11/19/2023      Reason for increased observation? Patient is impulsive and has poor safety awareness.  One-on-one safety watch: No  Q15-30 minute safety watch: No  Suicide precaution: No  Telemonitoring: Yes    Behavior outbursts during this shift? Yes  Description and time(s) of event: in the morning the patient was cooperative, but emotional. He was crying and depressed during therapies and nursing care. He was compliant with most care until the evening. At 1655 the patient pulled out their IV and refused all care, tube feeds, insulin, and medications. the patient tried to get out of bed, he wanted to leave, the staff redirected him into the chair, but he refused help, so he stand pivot/climbed into the chair directly next to the bed with supervision. The patient refused to be assisted with any care for the rest of the shift.   Agitation during this shift? Yes  Description and time(s) of event: described above      Active suicide ideation during this shift? No   Description and time(s) of event:     Poor safety awareness or impulsivity during this shift? Yes  Description and time(s) of event: described above.    Noteworthy visitor interactions during this shift? N/A  Sleep/rest patterns observed during this shift? N/A  Therapeutic interventions:    Distraction, Music, Redirection, and Reorientation  Additional Comments: n/a

## 2023-11-19 NOTE — Progress Notes
 PHYSICAL THERAPY  NOTE      Name: Kevin Obrien   MRN: [id number]     DOB: 1950-08-28      Age: 73 y.o.  Admission Date: 10/28/2023     LOS: 22 days     Date of Service: 11/19/2023         11/19/23 1400   Type of Note   Type of Note Daily   Time Calculation   Start Time 1400   Stop Time 1500   Time Calculation 60   $$ Gait / Mobility 2 units    $$ PT Therapeutic Activity 1 unit   $$ Therapeutic Exercise  1 unit    Subjective   Subjective Patient in bed before and after session. Agreeable to PT. Tearfully stated you guys help fix me.   Verbal Description Scale   Verbal Pain Description No Pain   Precautions   Comments L sided weakness, visual impairment (horizontal gaze palsy), PEG   Lying to Sitting on Side of Bed   Patient Needs Assistance With Trunk;Extra time to complete the activity   Assist Level Substantial/maximal assistance   Equipment No rails;HOB flat   Sit to Lying   Patient Needs Assistance With Trunk   Assist Level Substantial/maximal assistance   Equipment No rails;HOB flat   Comments assist to boost, increased time to scoot hips toward [facility] and laid down before being properly positioned   Sit to and from Stand   Sit to and from Stand Sit to stand;Stand to sit   Sit to Stand   Patient Needs Assistance With Facilitation of trunk;Facilitation of weight shift;Balance   Assist Level Partial/moderate assistance   Equipment Hand hold assist   Stand to Sit   Patient Needs Assistance With Facilitation of weight shift;Balance;Extra time to complete activity;Financial planner;Facilitation of hip flexion;Facilitation of trunk;Cues for positioning of hands   Assist Level Partial/moderate assistance   Equipment Hand hold assist   Stand Pivot Transfer   Patient Needs Assistance With Extra time to complete activity;Financial planner;Facilitation of hip flexion;Facilitation of trunk;Facilitation of weight shift;Cues for positioning of hands;Cues for sequencing;Balance;Facilitation of hip extension   Assist Level Partial/moderate assistance   Equipment Bed rail;Standard wheelchair;Hand hold assist   Comments nearing CGA when able to reach forward for armrest or bed rail, min A to help get hips over to sitting surface   Gait Distance 1   Distance 60 + 20x3   Patient Needs Assistance With Facilitation of weight shift;Cues for step length;Balance;Facilitation of trunk   Assist Level Dependent   Equipment Wheelchair follow   Patterns Decreased gait velocity;Narrow base of support;Shuffling;Trunk lean forward   Deviation Left Decreased hip extension;Decreased knee flexion in swing;Decreased stride length;No heel strike/foot flat;Left scissoring   Deviation Right Decreased hip extension;Decreased knee flexion in swing;Decreased stride length;No heel strike/foot flat;Right scissoring   Activity Limited By Weakness;Complaint of fatigue   Therapeutic Interventions   NuStep 7-9 Minutes;Level 1  (propelling with BUE and BLE, maintaining ~50spm)   Assessment   Assessment Patient making steady gains with stand pivot transfers when able to pull up and use armrest or rail for balance. Steadily increasing gait distance but still reqiuring close wheelchair follow and demo forward trunk and narrow base of support, intermittently scissoring BLE with fatigue. Toelrated increased time on nustep, enjoyed listening to the beatles.   Plan   PT Plan gait training Ax2 (hand hold assist, vector to help with posture vs rifton), neurogym for higher reps (consider using mirror for  visual feedback), standing/reaching activities to promote upright posture, trial stairs as appropriate, nustep for conditioning, outcome measures as appropriate, continued wc ultralight propulsion; standing at plinth activities   Recommendations   PT Discharge Recommendations Consistent assistance;Home setting with home health;Versus;Skilled nursing facility   PT Equipment Recommendations Too early to determine  (lightweight vs custom wheelchair if dc home) Therapist: Tereasa Free, PT, DPT  Date: 11/19/2023

## 2023-11-19 NOTE — Rehab Team Conference
 Team Conference Note     Date of Admission:  10/28/2023  Date of Team Conference:  11/19/2023   Kevin Obrien is a 73 y.o. male.     DOB: 09-Sep-1950                  MRN#:  [id number]    Team Conference  Attendees: Alyce Crest, Attending Physician; Corean Brewster MD, Resident Physician; Clarita Hummer SW, Social Worker; Silvano Perry RN, Teaching laboratory technician; Phyliss Quale, OTR, Occupational Therapy; KasiDee Cox, DPT, Physical Therapy; Devon Pedro, SLP, Speech Therapy; Milbert Hence PharmD, Pharmacy; Lesley Acre RN, Nursing    Medical Update: Kevin Obrien is a 73 y.o. male admitted to rehabilitation for stroke with resultant impairments including weakness, visual impairment, bilateral horizontal gaze palsy, diplopia, L facial paralysis (bells palsy), R facial weakness (brainstem stroke), dysphagia, dysarthria .    Patient will continue to receive Physical therapy, Occupational therapy, and Speech therapy each 60 minutes a day, 5 days a week for the duration of the rehabilitation stay within an interdisciplinary rehabilitation program with rehab nursing and PM&R physician oversight.    Medical updates this week:   -Bladder Management: persistent UTI per UA 6/8- started on Bactrim 6/9 with tentative plan for 5-7 day course vs ceftriaxone x3 days (concern IV may contribute to agitation); given urinary retention despite increased doxazosin dose, tentative plan for foley discussion following eval after UTI treatment  -Increased modafinil to 200mg  qAM, 100mg  qday however ongoing fluctuations in participation with cares and therapy likely psych-related as neuro exam remains stable  -Appreciate psych recs, consulted for SI- pt denied SI 6/6. Appreciate updated recs.  -Endocrinology assistance for DM management planning    Medical barriers to discharge: Requirement of CO, ongoing therapy and treatment refusals. Tube feeds plan and teaching. Final discharge diabetes management plan. Neurogenic bladder management plan- possible foley, to be discussed following UTI treatment. There are no medical barriers to discharge regarding PICC line, anticoagulation, ongoing labs, appointment timing, new oxygen, new dialysis, wound vacuum.     Follow-up Appointments (list specialities, not appointment details): Neurology, PM&R, Cardiology, possibly Urology      Team Goal:  Overall Team Goal  Patient Will Perform Basic Cares With: Partial/moderate assistance, Ongoing  Patient Will Perform Household Mobility With: Partial/moderate assistance, At wheelchair level    Discharge Planning     Discharge Date:  11/27/2023    Patient lives with his significant other and 35 y.o. daughter in a single family home that accomodates 1 level living. 2 STE.  Patient is independent with all ADLs and IADLs at baseline.  DME: [facility]  Pt is self employed. Owns a cleaning business.  Girlfriend works outside of the home. Pt's adult's daughters work and can't provide consistent supervision. [name] does provide oversight and checks on him daily.  Per daughters, pt would not have consistent supervision. They asked about caregivers through insurance. Pt only has [place] Medicaid. For him to be assessed for Frail and Elderly Waiver he would need to have medical Medicaid. They would not be able to pay out of pocket. IF pt needs consistent supervision/assistance will need to go to SNF.  Will need to discharge to SNF. Family has in-network SNF list. Family has requested referrals to [employer] and [employer] when appropriate. Pt will need to be off CO/Telesitter and less agitated before sending referrals.    PT Discharge Recommendations: Consistent assistance, Skilled nursing facility  OT Discharge Recommendations: Consistent supervision, Skilled nursing facility  PT  Equipment Recommendations: Too early to determine (lightweight vs custom wheelchair if dc home)  OT Equipment Recommendations: Tub transfer bench  SLP DC Recommendations: consistent supervision; assistance with meds/finances; ongoing ST    Rehabilitation Plan     Progress  Weekly PT Progress: Ax1 bed mobility and transfers, Ax2 for short disatnce gait, workong on wheelchair propulsion in ultralight wheelchair  Weekly OT Progress: Min to Mod assist for bathing and UB dressing, supervision for toothbrushing  ACE: 30/100  O-Log: 7/30; decrease in scores over past several attempts (6, 5, 7) compared to previous week, ? Motivation/effort contributing factor  Will trial memory book use for family education/training and assist with pt functional recall; will require staff assist for recordings  Benefits from verbal + visual instruction for command following/sequencing; allow increased time for processing  Difficulty noted with coin ID from vision perspective with counting money task  Variable cues for sequencing tasks  VSS completed 5/29; ongoing moderate-severe oropharyngeal dysphagia  Inconsistent participation in PO trials thin/mildly thick liquids, pureed solids; ongoing overt s/s aspiration  Increased encouragement for participation with swallow exercises  Oral cares throughout the day + ice chip protocol  Progression in POC has been limited from reduced motivation, low endurance, and occasional aggressive/resistive behaviors  Enjoys doing ADLs  Enjoys brushing his teeth  Aggression improving  Improved sleep    Barriers/Concerns  Weekly PT Barriers/Concerns: inconsistent participation, variable need for assist, impaired safety awareness, high risk for falls  Weekly OT Barriers/Concerns: total assist for LB dressing and footwear, varying assist and participation, impaired safety awareness  NPO  Unknown who will provide assistance with medication/finance management upon discharge  High burden of care   Educated on safety precs, need for straigh cath  Denies pain during the shift.No open wounds, pink in the coccyx area, A&D applied, Q2 turns. No impulsive/safety behavior during this shift.Telesitter in the room  confusion requiring frequent redirection/reorientation while awake.  Impaired safety awareness  Very high risk for falls    Plan  gait training Ax2 (hand hold assist, vector to help with posture vs rifton), neurogym for higher reps (consider using mirror for visual feedback), standing/reaching activities to promote upright posture, trial stairs as appropriate, nustep for conditioning, outcome measures as appropriate, continued wc ultralight propulsion; standing at plinth activities  LUE NMR, BUE coordination/strengthening, visual scanning, sitting/standing balance, ADL participation     Dietitian: Tube feed plan? Inconsistent participation with po trials. Poor motivation.   Neuropsychology: Touch base with Psychiatry today. Denying SI. Crying all session yesterday. I want to go home. OLog scores variable between 5-7/30.  Therapeutic Recreation: No update.    Weekly Team Goals  New AKI - Medicine consult  Tube feed plan  Persistent UTI  Intermittent catheterization  Intermittent participation    Frequent reorientation  Needs to be off the telesitter and be participatory prior to sending referrals to SNF    Final DM recs?    Family meeting Friday to discuss goals of care    Caregiver Training Plan: Family supportive, but planning for SNF    Goals   Short Term Goals (Weekly)  Bed Mobility Goals: Patient will perform supine to sit with, Patient will perform sit to supine with  Patient Will Perform Supine to Sit With: Partial/moderate assistance  Patient Will Perform Sit to Supine With: Partial/moderate assistance  Transfer Goals: Patient will complete sit to and from stand transfer with  Patient Will Complete Sit to and From Stand Transfer With: Partial/moderate assistance  Patient Will Ambulate  With: 50 feet, Partial/moderate assistance, With device  Patient Will Ascend/Descend: 4 steps, Dependence, 2 rails  Short Term Goals (Weekly)  ADL Goals: Toileting, Footwear, Lower body dressing  Patient Will Perform Toileting With: Partial/moderate assistance, Progressing  Patient Will Perform Footwear With: Partial/moderate assistance, Progressing  Patient Will Perform Lower Body Dressing With: Partial/moderate assistance, Progressing  Self-Care Transfers: All self-care transfers  Patient Will Perform All Self-Care Transfers With: Partial/moderate assistance, Progressing     Quality Indicators     Swallowing/Nutritional Status: Tube/parenteral feeding  Bladder Continence: 9-Not applicable (Indwelling catheter or ostomy) (ISC Q4-6 HRS)  Bowel continence: 2-Frequently incontinent (2+ episodes of incontinence, at least one continent)    If you have questions about the above note, please talk to the Physical Medicine & Rehabilitation team or your Social Worker/Nurse Case Manager.    When applicable, [facility] will update your insurance representative weekly to ensure continuation of coverage throughout your stay. Any changes to your insurance authorization throughout your stay will be conveyed to you. The [facility] System Billing Department can be contacted Monday-Friday (7:30am-5:30pm) by phone at [phone] or [phone].

## 2023-11-19 NOTE — Consults
 General Medicine Initial Consult Note    Name: Kevin Obrien        MRN: [id number]          DOB: 16-Jun-1950            Age: 73 y.o.  Admission Date: 10/28/2023       LOS: 22 days    Date of Service: 11/19/2023    Reason for Consult:  Pt with UTI, resistent to bactrim. Starting Ceftriaxone 6/10-6/12.  Please assist with management of AKI.    Consult type: Co-Management w/Signed Orders    Assessment     73 y.o. male with past medical history of BPH, diabetes mellitus, hypertension, hyperlipidemia, coronary artery disease status post PCI, Acute r pontomedullary infarct admitted to rehab. Internal medicine consulted for UTI, AKI.       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  Suspect secondary to Bactrim use.  This is elevation of creatinine secondary to trimethoprim.  - Bactrim has been stopped.  Agree with Rocephin for now.  Administering 500 cc normal saline for now.  Ordered.    Acute hyperkalemia potassium 5.5.  -Patient on lisinopril.  Hold for now.  - Recheck in the morning.  Administer IV fluids.      Urinary tract infection:  -11/17/23 urine cultures with E coli.   Agree with holding Bactrim.  Continue with ceftriaxone for now.    Htn: - BP low normal.   - monitor with current meds for now.     Continue current meds.     Recommendations:  Throughout.    Balinda Maxon MD,FACP  Pager [phone]  Consult pager [phone]    Thank you for the consult.  We will continue to follow.    Please direct initial questions to the primary team.  General Medicine consults can be contacted via Voalte using Med Consults 1 or 2 First Call 24 hours a day  ______________________________________________________________________    Chief Complaint:    History of Present Illness: Kevin Obrien is a 73 y.o. male with past medical history of BPH, diabetes mellitus, hypertension, hyperlipidemia, coronary artery disease status post PCI, Acute r pontomedullary infarct admitted to rehab. Internal medicine consulted for UTI, AKI.  Patient on my evaluation is poor historian.  Per bedside nursing assessment was his baseline.  He did interact.  He was alert.  He denied any chest pain, shortness of breath fever chills.  Per nursing no oral intake.  He gets his feeds through G-tube.  Intermittently refusing feeds.  Denies any dizziness lightheadedness currently.    Past Medical  History:    Accidental fall    Angina pectoris    Arthritis    BPH (benign prostatic hyperplasia)    Cataract    Constipation    Coronary artery disease    DM (diabetes mellitus) (CMS-HCC)    ED (erectile dysfunction)    Heart attack (CMS-HCC)    Hyperlipidemia    Hypertension    Joint pain    Neuropathy    Shingles     Surgical History:   Procedure Laterality Date    CORONARY STENT PLACEMENT  2016    HX CHOLECYSTECTOMY  09/2015    COLONOSCOPY N/A 04/20/2016    Performed by Rogers Kipper, MD at [facility]    ESOPHAGOGASTRODUODENOSCOPY N/A 04/20/2016    Performed by Rogers Kipper, MD at [facility]    ESOPHAGOGASTRODUODENOSCOPY BIOPSY  04/20/2016    Performed by Rogers Kipper, MD at [facility] ENDO    UREA BREATH TEST N/A 11/13/2016    Performed by Ledora Catalina, MD at [facility] END[facility]     Social History     Tobacco Use Smoking status: Former     Current packs/day: 0.00     Average packs/day: 2.0 packs/day for 10.0 years (20.0 ttl pk-yrs)     Types: Cigarettes     Start date: 04/11/1961     Quit date: 04/12/1971     Years since quitting: 52.6    Smokeless tobacco: Never    Tobacco comments:     Former smoker/quit years ago   Vaping Use    Vaping status: Never Used   Substance and Sexual Activity    Alcohol use: Not Currently    Drug use: Never    Sexual activity: Not Currently     Partners: Female     Birth control/protection: None           Family history reviewed; non-contributory    Allergies:  Seasonal allergies    Scheduled Meds:acetaminophen (TYLENOL) tablet 650 mg, 650 mg, Feeding Tube, Q6H*  amLODIPine (NORVASC) tablet 5 mg, 5 mg, Per NG tube, QDAY  artificial tears (PF) single dose ophthalmic solution 2 drop, 2 drop, Both Eyes, TID  aspirin chewable tablet 81 mg, 81 mg, Per NG tube, QDAY  cefTRIAXone (ROCEPHIN) IVP 2 g, 2 g, Intravenous, Q24H*  clopiDOGreL (PLAVIX) tablet 75 mg, 75 mg, Per NG tube, QDAY  diclofenac sodium (VOLTAREN) 1 % topical gel 2 g, 2 g, Topical, QID  Diet Enteral Feeding Bolus, , SEE ADMIN INSTRUCTIONS, QID (3,87,83,79)   And  Water Bolus, 30 mL, PEG Tube, QID (3,87,83,79)  doxazosin (CARDURA) tablet 4 mg, 4 mg, Feeding Tube, QHS  enoxaparin (LOVENOX) syringe 40 mg, 40 mg, Subcutaneous, QDAY(21)  insulin aspart (U-100) (NOVOLOG FLEXPEN U-100 INSULIN) injection PEN 0-12 Units, 0-12 Units, Subcutaneous, QID  insulin aspart (U-100) (NOVOLOG FLEXPEN U-100 INSULIN) injection PEN 17 Units, 17 Units, Subcutaneous, QID  insulin glargine (LANTUS SOLOSTAR U-100 INSULIN) injection PEN 16 Units, 16 Units, Subcutaneous, QDAY  lidocaine (LIDODERM) 5 % topical patch 1 patch, 1 patch, Topical, QDAY  lisinopriL (ZESTRIL) tablet 20 mg, 20 mg, Feeding Tube, QDAY  melatonin tablet 5 mg, 5 mg, Feeding Tube, QHS  mirtazapine (REMERON) tablet 15 mg, 15 mg, Oral, QHS  [START ON 11/20/2023] modafiniL (PROVIGIL) tablet 200 mg, 200 mg, Oral, QDAY   And  modafiniL (PROVIGIL) tablet 100 mg, 100 mg, Oral, QDAY  petrolatum (STYE) ophthalmic ointment 0.25 inch, 0.25 inch, Both Eyes, TID  propranoloL (INDERAL) tablet 10 mg, 10 mg, Per NG tube,  BID  rosuvastatin (CRESTOR) tablet 40 mg, 40 mg, Feeding Tube, QDAY  thiamine mononitrate (vit B1) tablet 100 mg, 100 mg, Per NG tube, QDAY    Continuous Infusions:  PRN and Respiratory Meds:bisacodyL QDAY PRN, dextrose 50% (D50) IV PRN, diclofenac sodium QID PRN, lidocaine hcl PRN, loperamide TID PRN, oxyCODONE Q12H PRN, pancrelipase 20,880 Units/sodium bicarbonate 650 mg ([place] CLOG DESTROYER) PRN (On Call from Rx), phenoL PRN, trazodone(#) BID PRN    Review of Systems:  A ROS was performed and noted in the HPI.  All other systems negative.    Vital Signs:  Last Filed in 24 hours Vital Signs:  24 hour Range    BP: 108/43 (06/10 0912)  Temp: 36.8 ?C (98.3 ?F) (06/10 9451)  Pulse: 68 (06/10 0912)  Respirations: 16 PER MINUTE (06/10 0548)  SpO2: 96 % (06/10 0548)  O2 Device: None (Room air) (06/10 0548) BP: (108-127)/(43-50)   Temp:  [36.8 ?C (98.3 ?F)]   Pulse:  [68-79]   Respirations:  [16 PER MINUTE]   SpO2:  [96 %]   O2 Device: None (Room air)     Physical Exam:    General:  Alert, cooperative, no distress  Head:  Normocephalic, Atraumatic  Eyes:  No scleral icterus  Ext: warm, no pedal edema  Lungs:  symmetric chest rise, no tachypnea, CTA b/l  Heart:  normal pulse, regular S1+S2  Abdomen:  soft, non tender, non distended  Skin:   No rashes  Neurologic:  dysarthria , facial droop       Lab/Radiology/Other Diagnostic Tests:  24-hour labs:    Results for orders placed or performed during the hospital encounter of 10/28/23 (from the past 24 hours)   POC GLUCOSE    Collection Time: 11/18/23  8:36 PM   Result Value Ref Range    Glucose, POC 260 (H) 70 - 100 mg/dL   POC GLUCOSE    Collection Time: 11/19/23  6:17 AM   Result Value Ref Range    Glucose, POC 147 (H) 70 - 100 mg/dL   BASIC METABOLIC PANEL CELLULAR THERAPEUTICS    Collection Time: 11/19/23  6:18 AM   Result Value Ref Range    Sodium 135 (L) 137 - 147 mmol/L    Potassium 5.5 (H) 3.5 - 5.1 mmol/L    Chloride 99 98 - 110 mmol/L    Glucose 133 (H) 70 - 100 mg/dL    Blood Urea Nitrogen 47 (H) 7 - 25 mg/dL    Creatinine 8.64 (H) 0.40 - 1.24 mg/dL    Calcium 9.5 8.5 - 89.3 mg/dL    CO2 27 21 - 30 mmol/L    Anion Gap 9 3 - 12    Glomerular Filtration Rate (GFR) 55 (L) >60 mL/min   POC GLUCOSE    Collection Time: 11/19/23  6:51 AM   Result Value Ref Range    Glucose, POC 159 (H) 70 - 100 mg/dL   POC GLUCOSE    Collection Time: 11/19/23 12:28 PM   Result Value Ref Range    Glucose, POC 191 (H) 70 - 100 mg/dL     Pertinent radiology reviewed.    Balinda Maxon, MD

## 2023-11-19 NOTE — Progress Notes
 OCCUPATIONAL THERAPY  NOTE   Name: Kevin Obrien   MRN: [id number]     DOB: 10/04/1950      Age: 73 y.o.  Admission Date: 10/28/2023     LOS: 22 days     Date of Service: 11/19/2023         11/19/23 1300   Type of Note   Type of Note Daily   Time Calculation   Start Time 1300   Stop Time 1355   Time Calculation 55   $$ Phys ADL Skills 1 unit   $$ Therapeutic Activity 3 unit - 45 min   Subjective   Subjective Pt seated in bed on OT arrival with RN present. Pt seated in bed on exit with alarm on and needs met/in reach   Precautions   Comments L sided weakness, visual impairment (horizontal gaze palsy), PEG   Eating   Patient Needs Assistance With Scooping;Scanning to locate food on plate   Assist Level Partial/moderate assistance   Comments Patient eating x4 ice chips with spoon from cup. Required assist to orient spoon and cues to complete with increased time   Therapeutic Interventions   Therapeutic Activities Patient engaged in therapeutic participation of listening to music and engaged in conversation about where patient grew up, his family, and his interests. Patient very tearful at start of session when listening to The Beatles. Therapist providing therapeutic communication and strategies engaging patient in conversation about his interests. Therapist opened blinds in room and updated white board to provide strategies for staff to play The Beatles for engagement activities. Therapist will provide CD player in room and touch base with family and see if they can bring CD's from home for leisure and comfort.   Assessment   Assessment Patient very tearful at start of session. Patient responded well to therapeutic communication and strategies provided by therapist to increase mood and talking about patients interests and his family. Patient ate ice chips while seated in bed with moderate assist. Continue progressing towards goals   Plan   OT Plan LUE NMR, BUE coordination/strengthening, visual scanning, sitting/standing balance, ADL participation, CD player   Recommendations   OT Discharge Recommendations Consistent supervision;Skilled nursing facility         Therapist: Phyliss Quale, OTD, OTR/L  Date: 11/19/2023

## 2023-11-20 LAB — POC GLUCOSE
~~LOC~~ BKR POC GLUCOSE: 131 mg/dL — ABNORMAL HIGH (ref 70–100)
~~LOC~~ BKR POC GLUCOSE: 144 mg/dL — ABNORMAL HIGH (ref 70–100)
~~LOC~~ BKR POC GLUCOSE: 150 mg/dL — ABNORMAL HIGH (ref 70–100)
~~LOC~~ BKR POC GLUCOSE: 167 mg/dL — ABNORMAL HIGH (ref 70–100)

## 2023-11-20 MED ORDER — SODIUM CHLORIDE 0.9% IV BOLUS
500 mL | Freq: Once | INTRAVENOUS | 0 refills | Status: CP
Start: 2023-11-20 — End: ?
  Administered 2023-11-20: 20:00:00 500 mL via INTRAVENOUS

## 2023-11-20 NOTE — Progress Notes
 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)    Postherpetic neuralgia    Essential tremor    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    Poorly controlled diabetes mellitus (CMS-HCC)    Nephrotic range proteinuria    Ischemic stroke (CMS-HCC)    Moderate malnutrition    On enteral nutrition    Adjustment disorder with depressed mood    Expressive aphasia    Present on Admission:   Ischemic stroke (CMS-HCC)   Poorly controlled diabetes mellitus (CMS-HCC)   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   Essential tremor   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 male with past medical history of BPH, diabetes mellitus, hypertension, hyperlipidemia, coronary artery disease status post PCI, Acute r pontomedullary infarct admitted to rehab. Internal medicine consulted for UTI, AKI.        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  Suspect secondary to Bactrim use.    - Bactrim has been stopped.  Agree with Rocephin for now.  - did not receive IVF yesterday as he did not get IV. Plan for IVF today.   - monitor BMP in am.      Acute hyperkalemia - improved.  Urinary tract infection:  -11/17/23 urine cultures with E coli.   Agree with holding Bactrim.    - Continue with ceftriaxone for now.     Htn:   - BP stable.     Continue current meds.      Recommendations:  Throughout.     Balinda Maxon MD,FACP  Pager [phone]  Consult pager [phone]          ________________________________________________________________________    Subjective    Kevin Obrien is a 73 y.o. male.  Patient seen and examined. Daughter at bedside. Tolerating feeds but refuses intermittently. Patient denies chest pain, dyspnea, fever or chills.  Denies nausea, vomiting or diarrhea.          Objective                       Vital Signs: Last Filed                 Vital Signs: 24 Hour Range   BP: 125/46 (06/11 1014)  Temp: 36.3 ?C (97.4 ?F) (06/11 9252)  Pulse: 74 (06/11 1014)  Respirations: 16 PER MINUTE (06/11 0747)  SpO2: 97 % (06/11 0747)  O2 Device: None (Room air) (06/11 0747) BP: (107-126)/(42-52) Temp:  [36.3 ?C (97.4 ?F)]   Pulse:  [73-85]   Respirations:  [16 PER MINUTE]   SpO2:  [97 %-99 %]   O2 Device: None (Room air)         Intake/Output Summary:  (Last 24 hours)    Intake/Output Summary (Last 24 hours) at 11/20/2023 1532  Last data filed at 11/20/2023 1400  Gross per 24 hour   Intake 1186 ml   Output 1875 ml   Net -689 ml       Physical Exam     General:  Alert, cooperative, no distress  Head:  Normocephalic, Atraumatic  Eyes:  No scleral icterus  Ext: warm, no pedal edema  Lungs:  symmetric chest rise, no tachypnea, CTA b/l  Heart:  normal pulse, regular S1+S2  Abdomen:  soft, non tender, non distended  Skin:   No rashes  Neurologic:  dysarthria , facial droop          Medications  Scheduled IV PRN   acetaminophen (TYLENOL) tablet 650 mg, 650 mg, Feeding Tube, Q6H*  amLODIPine (NORVASC) tablet 5 mg, 5 mg, Per NG tube, QDAY  artificial tears (PF) single dose ophthalmic solution 2 drop, 2 drop, Both Eyes, TID  aspirin chewable tablet 81 mg, 81 mg, Per NG tube, QDAY  cefTRIAXone (ROCEPHIN) IVP 2 g, 2 g, Intravenous, Q24H*  clopiDOGreL (PLAVIX) tablet 75 mg, 75 mg, Per NG tube, QDAY  diclofenac sodium (VOLTAREN) 1 % topical gel 2 g, 2 g, Topical, QID  Diet Enteral Feeding Bolus, , SEE ADMIN INSTRUCTIONS, QID (3,87,83,79)   And  Water Bolus, 30 mL, PEG Tube, QID (3,87,83,79)  doxazosin (CARDURA) tablet 4 mg, 4 mg, Feeding Tube, QHS  enoxaparin (LOVENOX) syringe 40 mg, 40 mg, Subcutaneous, QDAY(21)  insulin aspart (U-100) (NOVOLOG FLEXPEN U-100 INSULIN) injection PEN 0-12 Units, 0-12 Units, Subcutaneous, QID  insulin aspart (U-100) (NOVOLOG FLEXPEN U-100 INSULIN) injection PEN 17 Units, 17 Units, Subcutaneous, QID  insulin glargine (LANTUS SOLOSTAR U-100 INSULIN) injection PEN 16 Units, 16 Units, Subcutaneous, QDAY  lidocaine (LIDODERM) 5 % topical patch 1 patch, 1 patch, Topical, QDAY  melatonin tablet 5 mg, 5 mg, Feeding Tube, QHS  mirtazapine (REMERON) tablet 15 mg, 15 mg, Oral, QHS  modafiniL (PROVIGIL)  tablet 200 mg, 200 mg, Oral, QDAY   And  modafiniL (PROVIGIL) tablet 100 mg, 100 mg, Oral, QDAY  petrolatum (STYE) ophthalmic ointment 0.25 inch, 0.25 inch, Both Eyes, TID  propranoloL (INDERAL) tablet 10 mg, 10 mg, Per NG tube, BID  rosuvastatin (CRESTOR) tablet 40 mg, 40 mg, Feeding Tube, QDAY  sodium chloride 0.9% IV bolus 500 mL, 500 mL, Intravenous, ONCE  thiamine mononitrate (vit B1) tablet 100 mg, 100 mg, Per NG tube, QDAY      bisacodyL QDAY PRN, dextrose 50% (D50) IV PRN, diclofenac sodium QID PRN, lidocaine hcl PRN, loperamide TID PRN, oxyCODONE Q12H PRN, pancrelipase 20,880 Units/sodium bicarbonate 650 mg ([place] CLOG DESTROYER) PRN (On Call from Rx), phenoL PRN, trazodone(#) BID PRN       Lab Review   24-hour labs:    Results for orders placed or performed during the hospital encounter of 10/28/23 (from the past 24 hours)   POC GLUCOSE    Collection Time: 11/19/23  4:04 PM   Result Value Ref Range    Glucose, POC 186 (H) 70 - 100 mg/dL   POC GLUCOSE    Collection Time: 11/19/23 10:11 PM   Result Value Ref Range    Glucose, POC 131 (H) 70 - 100 mg/dL   POC GLUCOSE    Collection Time: 11/20/23  5:38 AM   Result Value Ref Range    Glucose, POC 144 (H) 70 - 100 mg/dL   CBC CELLULAR THERAPEUTICS    Collection Time: 11/20/23  6:24 AM   Result Value Ref Range    White Blood Cells 6.80 4.50 - 11.00 10*3/uL    Red Blood Cells 3.81 (L) 4.40 - 5.50 10*6/uL    Hemoglobin 11.8 (L) 13.5 - 16.5 g/dL    Hematocrit 66.7 (L) 40.0 - 50.0 %    MCV 87.4 80.0 - 100.0 fL    MCH 31.1 26.0 - 34.0 pg    MCHC 35.6 32.0 - 36.0 g/dL    RDW 87.0 88.9 - 84.9 %    Platelet Count 207 150 - 400 10*3/uL    MPV 8.6 7.0 - 11.0 fL   BASIC METABOLIC PANEL CELLULAR THERAPEUTICS    Collection Time: 11/20/23  6:24 AM   Result Value Ref Range    Sodium 136 (L) 137 - 147 mmol/L    Potassium 4.6 3.5 - 5.1 mmol/L    Chloride 100 98 - 110 mmol/L    Glucose 179 (H) 70 - 100 mg/dL    Blood Urea Nitrogen 46 (H) 7 - 25 mg/dL    Creatinine 8.72 (H) 0.40 - 1.24 mg/dL    Calcium 9.5 8.5 - 89.3 mg/dL    CO2 25 21 - 30 mmol/L    Anion Gap 11 3 - 12    Glomerular Filtration Rate (GFR) 59 (L) >60 mL/min   POC GLUCOSE    Collection Time: 11/20/23 12:21 PM   Result Value Ref Range    Glucose, POC 150 (H) 70 - 100 mg/dL                  Point of Care Testing  (Last 24 hours)  Glucose: (!) 179 (11/20/23 0624)  POC Glucose (Download): (!) 150 (11/20/23 1221)

## 2023-11-20 NOTE — Progress Notes
 SPEECH-LANGUAGE PATHOLOGY     11/20/23 1120   Behavior   Comments* Pt resting in recliner upon arrival of therapy with daughter present, listening to music. Pt agreeable to scheduled therapy and participitary throughout. Pt left in wheelchair with all needs met and call light in reach. Daughter present in room upon completion of session. Safety handoff completed with nursing staff via Volte. Make up minutes provided from ST session earlier this date.   Dysphagia Goals   Therapy Activities Swallow exercises;PO trials   Swallow exercises Effortful swallow   PO Trials Ice chips;Thin liquids;Mildly thick liquids;Puree   Therapy Activity Comments Oral cares completed prior to ST session. Pt participated in trials of ice chips, thin liquids, mildly thick liquids, and puree. Cough response x3 noted during trials of thin liquids and multiple swallows noted with puree. Pt used head tilt back to assist with a/p transfer of bolus for mildly thick and puree. Pt with hx of silent aspiration. Slowed swallow initiation noted with mildly thick and puree. Oral care completed following trials.     Effortful swallow completed with trials of thin and mildly thick liquids. Pt cued to swallow 2-3 times after each trial. Completed x10 for max gestural and verbal cues to swallow multiple times.   Orientation Goals   Therapy Activities Questions   Therapy Activity Comments Reviewed orientation questions: date and place to assist with orientation. Pt repeated x3 however demonstrated poor carryover for recall later in the session.   Attention Goals   Therapy Activities Sustained attention   Therapy Activity Comments Sustained attention targeted with conversation re: music. Pt and pt's daughter listening to 22s music prior to ST session. Pt answered simple yes/no questions and simple open-ended questions re: the song and artist. Pt benefited from repetition of instructions and increased processing time for answering questions.       Assessment Pt with increased participation and alertness this session compared to previous ST session.    Plan Cognition:  -- O-log  -- Simple attention/memory  -- Visual scanning  -- Medication management  -- Finance management  -- Consider WAB-Bedside     Dysphagia:  -- PO trials thin/mildly thick liquids (consider trialing motivating drink choices to increase participation), pureed solids   -- EMST-Lite  -- Swallow exercises as able (effortful swallow, masako, lingual protrusion/holds)   -- Continue IOPI as able     Kevin Obrien, M.A., CCC-SLP

## 2023-11-20 NOTE — Progress Notes
 I have reviewed the notes, assessments, and/or procedures performed by Rexine Cater, RN and concur with her/his documentation unless otherwise noted.

## 2023-11-20 NOTE — Progress Notes
 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  male admitted to [facility] on 10/28/2023 with the following issues: stroke      Rehabilitation Plan  Tentative discharge date: 11/22/2023  Rehabilitation: Patient will continue with comprehensive therapies at modified therapy schedule including physical therapy, occupational therapy, speech & language pathology, specialized rehab nursing, neuropsychology and physiatry oversight.    Goals:  Patient Will Perform Basic Cares With: Partial/moderate assistance, Ongoing  Patient Will Perform Household Mobility With: Partial/moderate assistance, At wheelchair level  Recommended therapy after discharge: Consistent assistance, Home setting with home health, Versus, Skilled nursing facility  PT recommended equipment: Too early to determine (lightweight vs custom wheelchair if dc home)  OT recommended equipment: Tub transfer bench    Daily Functional Update:  Stand to Sit Assist Level: Partial/moderate assistance (11/19/2023  2:00 PM)  Equipment: Hand hold assist (11/19/2023  2:00 PM)     Stand Pivot Assist Level: Partial/moderate assistance (11/19/2023  2:00 PM)  Equipment: Bed rail; Standard wheelchair; Hand hold assist (11/19/2023  2:00 PM)     Slideboard No data recorded  No data recorded   Gait Distance: 60 + 20x3 (11/19/2023  2:00 PM)    Assist Level: Dependent (11/19/2023  2:00 PM)    Equipment: Wheelchair follow (11/19/2023  2:00 PM)     Wheelchair Distance 1: 50x2 (11/18/2023 10:00 AM)    Assist Level: Partial/moderate assistance (11/18/2023 10:00 AM)     Toileting       Assist Level: Substantial/maximal assistance (11/19/2023 11:30 AM)  Equipment: Grab bar (11/19/2023 11:30 AM)     Toilet Transfer No data recorded   Upper Body Dressing Assist Level: Substantial/maximal assistance (11/16/2023  9:45 AM)     Lower Body Dressing Assist Level: Dependent (11/16/2023  9:45 AM)       Diet: PO: Ice chips only (11/07/2023  1:30 PM)      Orientation Log & Cognitive Log Scores for the past 72 hrs (Last 3 readings):   Orientation Log Score (Out of 30)   11/18/23 1045 7/30           Current Medical Problems/Risks  of Medical Complications/Management    Acute Right Pontomedullary Ischemic Stroke  Proximal Right Intracranial Vertebral Artery Occlusion  Diffuse Intracranial Atherosclerosis  Bilateral Horizontal Gaze Palsy  History of Forced Left Gaze Deviation  Left Facial Palsy  Impairments: left hemiparesis, expressive aphasia, oropharyngeal dysphagia, right facial weakness, left facial droop with dysarthria, diplopia, concern for attention/cognitive impairments, LUE ataxia, neurogenic bowel, neurogenic bladder  Impaired mobility and ADL, impaired swallow and cognition/communication               > consult PT/OT/SLP and neuropsychology to evaluate and treat  > Agitation prevention: Metoprolol switched to propranolol 10MG  bid.  Working on improved sleep/wake cycle with adjustments to modafinil (increase AM dose to 200mg  at 0530, 100mg  1000 6/10) and Remeron increased to 15mg  6/10.  > Agitation abortive: Trazodone 12.5 mg BID as needed.  If second line is needed, will reach out to psychiatry regarding secondary agent such as Seroquel or increasing trazodone as his Qtc has improved on 6/5 EKG.                   > Sleep optimization: melatonin 5mg  qhs, previously on seroquel nightly               > follow up: Neurology, PM&R, Cardiology     Secondary Stroke Risk Optimization  > Antiplatelet/Anticoagulation: DAPT with aspirin 81mg  daily and clopidogrel 75mg  daily for 90 days followed by aspirin 81mg  indefinitely  > Blood Pressure Goal: <160 during acute hospitalization, long term goal of < 130/80               - Current antihypertensive medications: amlodipine 5mg  daily, lisinopril 20mg  daily, propranolol 10mg  BID (started 6/5)  > Lipids: LDL is 161, with goal of <70: rosuvastatin 40mg  daily (per Cardiology recs)  > Blood Glucose: Hgb A1c is 11.7%, with goal of <7  > Tobacco abstinence goal     Urinary tract infection  Neurogenic Bladder with Retention       -Post-Void (PVR) Bladder Scan (mL): 319 milliliters (11/19/2023  4:09 PM)  -Straight Cath (mL): 200 (11/20/2023  5:41 AM)  - One episode of self-void 5/28; doxazosin increased to 3mg   - s/p bactrim 160mg  bid 5/31-6/1 - urine cx +e. Coli, sensitive to bactrim  -Patient with worsening cloudy urine 6/7, urinalysis obtained 6/8 notable for 3+ leukocytes, Bactrim restarted for a nongated course of 5-7 days.  Culture pending  > Monitor urinary retention per unit protocol. If unable to void or PVRs >300cc perform clean ISC (intermittent straight catheterization) for volumes > . Stop checking BVIs if 3 consecutive volumes <150cc.   > Discuss foley placement vs ISC following treatment of UTI / improvement in mood and participation (pt was noted to be pulling on PEG tube, will not place foley at this time in case he does so with foley catheter)  > Doxazosin 4mg  QHS (last increase 6/3)  > Bactrim started 6/8 - cx e.coli, resistent to bactrim. Start Ceftriaxone (6/10-6/12)- pt no longer with IV and therefore did not receive IVF as recommended by IM, or IV antibiotics yet 6/11. Will attempt IV placement with IV therapy. Will consider adjusting antibiotics to PO form and adjusting fluids to increase intake via PEG tube in the setting of AKI.     AKI  Hyperkalemia  - Cr 1.35 from 0.98 6/10  - K- 5.5 6/10 s/p lokelma x1   > monitor daily BMP   > consult IM for assistance with AKI management- note above comment  on IV removal and tentative plan for replacement vs working with dietician to possibly increase fluid intake     Suicidal Ideation  Pseudobulbar Affect  Depression  - family denies knowledge of previous or current SI  - Per Neurology: Consider starting Dextromethorphan-Quinidine in the outpatient setting (unfortunately unavailble on the formulary inpatient) for Pseudobulbar affect, will start Fluoxetine 20mg  in the meantime --> instead was started on Sertraline 50mg , will continue while in rehab  - Per neuropsychology eval on 5/28: Pt?s reported mood was sad. Pt endorsed sadness and passive SI and also made a gesture of shooting himself. He reported that the last time he thought about it was yesterday.    > neuropsychology and psychiatry following   > Neurology clinic follow up on dc  > Appreciate Psychiatry Recommendations-    -- EKG 6/5- Qtc <470.   -- Continue Mirtazapine 7.5mg  QHS for mood, sleep, and appetite- increase to 15mg  QHS 6/10   -- Trazodone 12.5mg  BID PRN for agitation - discuss with psych possibly scheduling trazodone at night for improved sleep   -- Pt denies SI-okay to discontinue CO, transition to one-to-one safety watch in the setting of impulsivity and elopement risk as indicated     Dysphagia s/p PEG tube placement  Moderate Malnutrition  - PEG tube placement 5/15               > Dietician consult, appreciate tube feed recommendations- transition from continuous to bolus feeds 5/19. Appreciate most updated nutrition recommendations, 5/27:  > ADAT to least restrictive per SLP; currently strict NPO with ice chips only when brightly alert   > EN Recs: Bolus Feeds of Jevity 1.5 x5 cartons daily; administered as 1 + 1/4 cartons ( ) QID  Provides 1778kcal, 76g protein, 901mL free fluids  Recommend at minimum water flushes of 30mL before and after each bolus feed for tube patency.   Additional fluids per primary team, with consideration for water boluses of 100mL before and after bolus feeds (provides additional 800mL daily for total fluids of 1725ml/d)  > Per videoswallow study 5/29- silently aspirated thin liquids and penetrated mildly thick liquids, too much oral weakness/uncoordination to consider dysphagia diet safely at this point; continue NPO at this time, continue to encourage ice chips throughout the day under supervision    Left Bell's Palsy  Discharge of the left eye - Improved  Left TMJ pain - Resolved  Ear Wax B/L ear - Improved  - s/p 1 week debrox               > Alternate eye patches; eyepatch for the L eye overnight, artificial tears TID daily in the setting of left facial palsy- increase drops to the left eye in the setting of L eye discharge. Monitor for ssx of infection to the left eye and consider erythromycin ointment if infection suspected   > lidocaine patch and tylenol to be continued as pt reports these improve left sided jaw pain    Oral Thrush - Resolved               > oral care TID   > Nystatin swish and spit started 5/20, ok'd by SLP - d/c'd 6/9    Daytime Sleepiness - Improved  Fatigue - Improved  Increased Somnolence - Resolved  - Concern for increased somnolence 5/20 leading to Neurology Consult- rec CTA H&N; discussed with team may attempt to liberalize BP I.e. decreasing amlodipine to 5mg  due to symptoms possibly 2/2 to hypoperfusion  -  CTA Head and Neck - negative for acute change  - Infectious workup including UA, CXR- negative for acute concerns  > appreciate further neurology recommendations- if acute focal weakness, numbness, coma, etc. Stroke activate/send to ED as he is high risk to have progression of his stroke/basilar involvement. Signed off.   > monitor on modafinil BID - ongoing adjustments as noted above to optimize alertness and participation though seems to be largely psych-related. Trial of modafinil 200mg  in the AM and 100mg  at ~1000 (new time change 6/10).    Bowel Management  At risk for Neurogenic Bowel  Loose stools   Constipation  -Last Bowel Movement Date: 11/18/23 (11/19/2023 10:00 PM)  - Goal for a daily BM or every 24-48 hours.   - c diff negative 5/18  > adjust bowel regimen PRN   > loperamide PRN     Hypertension  Orthostatic Hypotension  LVOT Obstruction  PFO  CAD s/p PCI               > metoprolol 25mg  BID - change to propranolol 10mg  BID 6/5               > amlodipine 5mg  daily, lisinopril 20mg  daily               > 30 day MCOT    > compression stockings to prevent sx orthostatic hypotension               > Follow up with outpatient Cardiology      Diabetes Mellitus               > Endocrinology consult for ongoing discharge regimen planning    Aspiration Pneumonia - Resolved  S/p AHRF  - increased O2 req overnight 5/13 with elevated procal, tachycardia, leukocytosis  - CXR 5/13 notable for mild basal opacities, favored to represent atelectasis  - s/p vanc x1 and zosyn x3 days  - sputum cx NGTD  - Augmentin to complete total 7 day course of antibiotics through 5/20 AM    Nutrition:   Body mass index is 24.58 kg/m?SABRA  Malnutrition Details:  Malnutrition present on admission  ICD-10 code E44: Chronic illness/Moderate non-severe malnutrition      Mild loss of muscle mass, Mild loss of body fat, Energy intake: Less than 75% of estimated energy requirement for 1 month or more Loss of Subcutaneous Fat: Yes Mild Orbital, Triceps  Muscle Wasting: Yes Moderate Clavicle, Temple, Deltoid               Malnutrition Interventions: Continue to provide EN to meet 100% estimated protein and kcal needs    Wounds:          DVT ppx: enoxaparin (LOVENOX) syringe 40 mg  QDAY(21); patient will not require at the time of discharge    Code Status: Full Code    Current Medications:  Scheduled Meds:acetaminophen (TYLENOL) tablet 650 mg, 650 mg, Feeding Tube, Q6H*  amLODIPine (NORVASC) tablet 5 mg, 5 mg, Per NG tube, QDAY  artificial tears (PF) single dose ophthalmic solution 2 drop, 2 drop, Both Eyes, TID  aspirin chewable tablet 81 mg, 81 mg, Per NG tube, QDAY  cefTRIAXone (ROCEPHIN) IVP 2 g, 2 g, Intravenous, Q24H*  clopiDOGreL (PLAVIX) tablet 75 mg, 75 mg, Per NG tube, QDAY  diclofenac sodium (VOLTAREN) 1 % topical gel 2 g, 2 g, Topical, QID  Diet Enteral Feeding Bolus, , SEE ADMIN INSTRUCTIONS, QID (3,87,83,79)   And  Water Bolus, 30 mL, PEG  Tube, QID (3,87,83,79)  doxazosin (CARDURA) tablet 4 mg, 4 mg, Feeding Tube, QHS  enoxaparin (LOVENOX) syringe 40 mg, 40 mg, Subcutaneous, QDAY(21)  insulin aspart (U-100) (NOVOLOG FLEXPEN U-100 INSULIN) injection PEN 0-12 Units, 0-12 Units, Subcutaneous, QID  insulin aspart (U-100) (NOVOLOG FLEXPEN U-100 INSULIN) injection PEN 17 Units, 17 Units, Subcutaneous, QID  insulin glargine (LANTUS SOLOSTAR U-100 INSULIN) injection PEN 16 Units, 16 Units, Subcutaneous, QDAY  lidocaine (LIDODERM) 5 % topical patch 1 patch, 1 patch, Topical, QDAY  melatonin tablet 5 mg, 5 mg, Feeding Tube, QHS  mirtazapine (REMERON) tablet 15 mg, 15 mg, Oral, QHS  modafiniL (PROVIGIL) tablet 200 mg, 200 mg, Oral, QDAY   And  modafiniL (PROVIGIL) tablet 100 mg, 100 mg, Oral, QDAY  petrolatum (STYE) ophthalmic ointment 0.25 inch, 0.25 inch, Both Eyes, TID  propranoloL (INDERAL) tablet 10 mg, 10 mg, Per NG tube, BID  rosuvastatin (CRESTOR) tablet 40 mg, 40 mg, Feeding Tube, QDAY  sodium chloride 0.9% infusion, 500 mL, Intravenous, ONCE  thiamine mononitrate (vit B1) tablet 100 mg, 100 mg, Per NG tube, QDAY    Continuous Infusions:  PRN and Respiratory Meds:bisacodyL QDAY PRN, dextrose 50% (D50) IV PRN, diclofenac sodium QID PRN, lidocaine hcl PRN, loperamide TID PRN, oxyCODONE Q12H PRN, pancrelipase 20,880 Units/sodium bicarbonate 650 mg ([place] CLOG DESTROYER) PRN (On Call from Rx), phenoL PRN, trazodone(#) BID PRN      Subjective     Pt seen at bedside following shower with OT. Daughter [name] at bedside.  Patient more interactive this morning, in better spirits.  Patient reports adequate sleep last night.  Denies pain.  Denies further acute concerns.  Discussed importance of speech therapy. Pt says yes when asked if he is ready for therapy the rest of the day today. Last bowel movement 6/9. Discussed with [name] plan for antibiotics and fluids whether by IV if tolerated or through tube.     Objective                          Vital Signs: Last Filed                 Vital Signs: 24 Hour Range   BP: 125/46 (06/11 1014)  Temp: 36.3 ?C (97.4 ?F) (06/11 9252)  Pulse: 74 (06/11 1014)  Respirations: 16 PER MINUTE (06/11 0747)  SpO2: 97 % (06/11 0747)  O2 Device: None (Room air) (06/11 0747) BP: (107-126)/(42-52)   Temp:  [36.3 ?C (97.4 ?F)-36.5 ?C (97.7 ?F)]   Pulse:  [71-85]   Respirations:  [16 PER MINUTE]   SpO2:  [97 %-99 %]   O2 Device: None (Room air)     Vitals:    11/18/23 0500 11/19/23 0548 11/20/23 0531   Weight: 65.3 kg (143 lb 15.4 oz) 65.8 kg (145 lb 1 oz) 67 kg (147 lb 11.3 oz)       Intake/Output Summary:  (Last 24 hours)    Intake/Output Summary (Last 24 hours) at 11/20/2023 1123  Last data filed at 11/20/2023 0541  Gross per 24 hour   Intake 832 ml   Output 1500 ml   Net -668 ml        Last Bowel Movement Date: 11/18/23    Labs--reviewed  Results for orders placed or performed during the hospital encounter of 10/28/23 (from the past 24 hours)   POC GLUCOSE    Collection Time: 11/19/23 12:28 PM # # Low-High    Glucose, POC 191 (H) 70 -  100 mg/dL   POC GLUCOSE    Collection Time: 11/19/23  4:04 PM   # # Low-High    Glucose, POC 186 (H) 70 - 100 mg/dL   POC GLUCOSE    Collection Time: 11/19/23 10:11 PM   # # Low-High    Glucose, POC 131 (H) 70 - 100 mg/dL   POC GLUCOSE    Collection Time: 11/20/23  5:38 AM   # # Low-High    Glucose, POC 144 (H) 70 - 100 mg/dL   CBC CELLULAR THERAPEUTICS    Collection Time: 11/20/23  6:24 AM   # # Sharleen    White Blood Cells 6.80 4.50 - 11.00 10*3/uL    Red Blood Cells 3.81 (L) 4.40 - 5.50 10*6/uL    Hemoglobin 11.8 (L) 13.5 - 16.5 g/dL    Hematocrit 66.7 (L) 40.0 - 50.0 %    MCV 87.4 80.0 - 100.0 fL    MCH 31.1 26.0 - 34.0 pg    MCHC 35.6 32.0 - 36.0 g/dL    RDW 87.0 88.9 - 84.9 %    Platelet Count 207 150 - 400 10*3/uL    MPV 8.6 7.0 - 11.0 fL   BASIC METABOLIC PANEL CELLULAR THERAPEUTICS    Collection Time: 11/20/23  6:24 AM   # # Sharleen    Sodium 136 (L) 137 - 147 mmol/L    Potassium 4.6 3.5 - 5.1 mmol/L    Chloride 100 98 - 110 mmol/L    Glucose 179 (H) 70 - 100 mg/dL    Blood Urea Nitrogen 46 (H) 7 - 25 mg/dL    Creatinine 8.72 (H) 0.40 - 1.24 mg/dL    Calcium 9.5 8.5 - 89.3 mg/dL    CO2 25 21 - 30 mmol/L    Anion Gap 11 3 - 12    Glomerular Filtration Rate (GFR) 59 (L) >60 mL/min         Physical Exam  General: No acute distress, alert elderly male sitting in bedside wheelchair  HEENT: NCAT  Heart: Extremities warm and well perfused  Lungs: Normal work of breathing on room air  Abdomen: +Gtube  Extremities: no peripheral edema  Psych: pleasant and interactive this AM  Neuro: Alert, interactive, able to answer simple questions yes or no or okay. Spontaneous movement of bilateral upper extremities noted.  Coordinated movements with left handed hand-shake. Noted ongoing left facial paralyis and bilateral horizontal gaze palsy.    Labs & Therapy Notes Reviewed    Corean CHRISTELLA Brewster, DO   Physical Medicine & Rehabilitation  For patient care questions contact Rehab Medicine 3 FIRST CALL via Voalte.

## 2023-11-20 NOTE — Progress Notes
 PHYSICAL THERAPY  NOTE      Name: Kevin Obrien   MRN: [id number]     DOB: 04-19-1951      Age: 73 y.o.  Admission Date: 10/28/2023     LOS: 23 days     Date of Service: 11/20/2023         11/20/23 1330   Type of Note   Type of Note Daily   Time Calculation   Start Time 1330   Stop Time 1415   Time Calculation 45   $$ Gait / Mobility 2 units    $$ Wh/chr Mngt/Mob 1 unit    Subjective   Subjective Patient sitting in recliner before and after session. Agreeable to PT. RN handoff at end of session.   Pain   Pain Scale No Pain   Precautions   Comments L sided weakness, visual impairment (horizontal gaze palsy), PEG   Sit to and from Stand   Sit to and from Stand Sit to stand;Stand to sit   Sit to Stand   Patient Needs Assistance With Facilitation of trunk;Facilitation of weight shift;Balance;Cues for positioning of hands;Cues for positioning of feet;Financial planner;Extra time to complete activity   Assist Level Partial/moderate assistance   Equipment Hand hold assist  (stair rails)   Comments mod A   Stand to Sit   Patient Needs Assistance With Facilitation of weight shift;Balance;Extra time to complete activity;Financial planner;Facilitation of hip flexion;Facilitation of trunk;Cues for positioning of hands;Cues for positioning of feet   Assist Level Substantial/maximal assistance   Equipment Hand hold assist   Chair/Bed-to-Chair Transfer   Chair/Bed-to-Chair Transfer Stand pivot   Stand Pivot Transfer   Patient Needs Assistance With Extra time to complete activity;Financial planner;Facilitation of hip flexion;Facilitation of trunk;Facilitation of weight shift;Cues for positioning of hands;Cues for sequencing;Balance;Facilitation of hip extension;Cues for positioning of feet   Assist Level Substantial/maximal assistance   Equipment Hand hold assist;Standard wheelchair   Comments min to max assist, does best when reaching for furthest armrest/rail; significant difficulty to turn and sit 180 deg after descending stairs requiring max cues and hand over hand assist   Gait Distance 1   Distance 400   Patient Needs Assistance With Facilitation of weight shift;Cues for step length;Balance;Facilitation of trunk   Assist Level Dependent   Equipment Specialty walker with harness ([place])   Patterns Decreased gait velocity;Narrow base of support;Shuffling;Trunk lean forward   Deviation Left Decreased hip extension;Decreased knee flexion in swing;Decreased stride length;No heel strike/foot flat;Left scissoring   Deviation Right Decreased hip extension;Decreased knee flexion in swing;Decreased stride length;No heel strike/foot flat;Right scissoring   Activity Limited By Weakness;Complaint of fatigue   Comments 3 seated breaks in sling   Stairs   Stairs # of Stairs Climbed (Set 1)   # of Stairs Climbed (Set 1)   # of Stairs Climbed 2x4   Patient Needs Assistance With Facilitation of weight shift;Balance;Cues for positioning of hands;Cues for positioning of feet;Extra time to complete the activity   Assist Level Substantial/maximal assistance   Equipment Two rails   Technique Non-reciprocal   Activity Limited By Complaint of fatigue;Weakness   Comments increased cues for fooot placement, likely limited by visual deficits   Wheelchair Distance 1   Distance 1 100'x2   Type Ultralight wheelchair   Patient Needs Assistance With Turns;Path navigation;Propulsion   Assist Level Substantial/maximal assistance   Propulsion Method Bilateral lower extremity   Comments improved propulsion and initiation for straight aways requiring primarily supervision with cues; would still be  considered mod-max A around objects/narrow spaces   Therapeutic Interventions   Modalities biofreeze and STM to certical extensors and upper traps   Assessment   Assessment Patient with good participation this afternoon, enjoying listening to Beatles during gait training. Was able to progress stairs Ax1 with two rails but requiring max assist with cues for sequencing and upright posture as patient nearly going to sit on stairs due to difficulty to turn and sit in chair. Increased total gait distance on rifton; with fatigue, LLE demo forefoor strike and decreased step length. Patient with ongoing forward flexed posture causing neck discomfort/tightness; relieved with biofreeze and STM to cervical extensors and upper traps. Improved wheelchair propulsion on straightaways, requiring less cues for initiation and propulsion techinque. Does best propelling with BLE.   Plan   PT Plan gait training Ax2 (hand hold assist, vector to help with posture vs rifton with Ax1), neurogym for higher reps (consider using mirror for visual feedback), standing/reaching activities to promote upright posture, stairs as appropriate - consider Ax2, nustep for conditioning, outcome measures as appropriate, continued wc ultralight propulsion; standing at plinth activities   Recommendations   PT Discharge Recommendations Consistent assistance;Home setting with home health;Versus;Skilled nursing facility   PT Equipment Recommendations Too early to determine  (would likely benefit from custom wc if dc home, sara steady)         Therapist: Tereasa Free, PT, DPT  Date: 11/20/2023

## 2023-11-20 NOTE — Progress Notes
 Acute Inpatient Rehabilitation Behavior Summary of Shift    Name: Kevin Obrien   MRN: [id number]     DOB: Aug 21, 1950      Age: 73 y.o.  Admission Date: 10/28/2023     LOS: 23 days     Date of Service: 11/20/2023      Reason for increased observation? Impulsivity, poor safety awareness, pulling at lines  One-on-one safety watch: no  Q15-30 minute safety watch: No  Suicide precaution: No  Telemonitoring: Yes    Behavior outbursts during this shift? No  Description and time(s) of event:    Agitation during this shift? Yes  Description and time(s) of event: with some care patient did show some agitation but family at bedside helped calm patient       Active suicide ideation during this shift? No   Description and time(s) of event:     Poor safety awareness or impulsivity during this shift? Yes  Description and time(s) of event: patient pulling at PEG towards the beginning of night stating he wanted it out and was tired of having it and just wanted food. With redirection from staff and family patient was able to stop activity.    Noteworthy visitor interactions during this shift? Daughter and wife at bedside  Sleep/rest patterns observed during this shift? Patient able to sleep all night after receiving medication   Therapeutic interventions:    Distraction, Redirection, Reorientation, and Stop activity  Additional Comments:

## 2023-11-20 NOTE — Case Management (ED)
 Case Management Progress Note    NAME:Kevin Obrien                          MRN: [id number]              DOB:Jul 06, 1950          AGE: 73 y.o.  ADMISSION DATE: 10/28/2023             DAYS ADMITTED: LOS: 23 days      Today's Date: 11/19/2023    PLAN:   Family meeting Friday at 12:45 in multipurpose room.    Discharge planning on-going. Will need placement.     Will send referrals as appropriate.    Expected Discharge Date: 11/22/2023   Is Patient Medically Stable: No, Please explain: Rehab goals. Infection  Are there Barriers to Discharge? yes (Agitation, telesitter, refusing care)    INTERVENTION/DISPOSITION:  Discharge Planning                 Attended team conference. Plan for family meeting.    SW met with pt at bedside after therapies. He was very interactive in conversation with SW today. He remains confused. We did have a pleasant visit about his goals to go home and being with his teenage daughter. He also talks about his adult daughters and wanting them to help him. SW and pt had long conversation about his goals and what he needs to do to help him meet his goals of eventually going home. A couple of times he can remember what he needed to do when using teach back method, but would forget and need to be reminded again.    SW reached out to pt's daughter, [name], to schedule family meeting for Friday. They are available at 12:45 on Friday. SW scheduled meeting and send out meeting maker.  Transportation                 Support                 Info or Referral                 Positive SDOH Domains and Potential Barriers                   Medication Needs                                                                                                                                                         Estate manager/land agent                 Other  Discharge Disposition Kevin Obrien    Kevin Obrien, LMSW   Available on voalte  [phone]

## 2023-11-20 NOTE — Progress Notes
 OCCUPATIONAL THERAPY  NOTE   Name: Kevin Obrien   MRN: [id number]     DOB: 1951-04-10      Age: 73 y.o.  Admission Date: 10/28/2023     LOS: 23 days     Date of Service: 11/20/2023         11/20/23 0915   Type of Note   Type of Note Daily   Time Calculation   Start Time 0915   Stop Time 1000   Time Calculation 45   $$ Phys ADL Skills 3 units   Subjective   Subjective Pt seated in bed on arrival requesting a shower. Pt seated in recliner chair on exit with alarm on and needs met/in reach   Precautions   Comments L sided weakness, visual impairment (horizontal gaze palsy), PEG   Cognitive   Orientation Person;Place;Situation   Patient Behavior Cooperative;Calm;Tearful   Cognition Follows Commands   Bathing   Bath/Shower Soap and water shower/bath   Patient Needs Assistance With Left arm;Right arm;Standing balance   Assist Level Partial/moderate assistance   Position Seated in shower   Comments intermittent verbal cues for sequencing - assist for BUE and drying BLE   Tub/Shower Transfer   Patient Needs Assistance With Financial planner;Facilitation of hip flexion;Facilitation of trunk;Facilitation of weight shift;Increased time to complete;Balance;Verbal cues;Transferring on;Transferring off;Assist of two   Transfer Type Stand pivot   Assist Level Partial/moderate assistance   Equipment Grab bar   Upper Body Dressing   Patient Needs Assistance With Shirt: threading or unthreading RUE;Shirt: threading or unthreading LUE;Shirt: pulling down or up over trunk;Balance   Assist Level Substantial/maximal assistance   Position Sitting edge of bed   Comments assist for balance throughout - increased time and assist to pull down over trunk   Lower Body Dressing   Patient Needs Assistance With Underwear: threading or unthreading RLE;Underwear: threading or unthreading LLE;Pants: threading or unthreading RLE;Pants: threading or unthreading LLE;Pants: closure (button, zipper);Balance   Assist Level Substantial/maximal assistance   Position Standing only to pull pants over hips   Comments pt pulls brief and pants over hips with increased time   Footwear   Patient Needs Assistance With Socks: applying or removing right;Socks: applying or removing left   Assist Level Substantial/maximal assistance   Position Sitting in chair   Comments Pt attempting to doff socks and unable to pull over heel requiring assist   Lying to Sitting on Side of Bed   Patient Needs Assistance With Trunk;Extra time to complete the activity   Assist Level Partial/moderate assistance   Equipment HOB elevated;With rails   Comments ModA   Sit to and from Stand   Sit to and from Stand Sit to stand;Stand to sit   Sit to Stand   Patient Needs Assistance With Facilitation of trunk;Facilitation of weight shift;Balance   Assist Level Partial/moderate assistance   Equipment Hand hold assist   Stand to Sit   Patient Needs Assistance With Facilitation of weight shift;Balance;Extra time to complete activity;Financial planner;Facilitation of hip flexion;Facilitation of trunk;Cues for positioning of hands;Assist of two   Assist Level Dependent   Equipment Hand hold assist   Comments ModA of OT and assist of tech to get hips over to sitting surface   Chair/Bed-to-Chair Transfer   Chair/Bed-to-Chair Transfer Squat pivot;Ambulation transfer   Stand Pivot Transfer   Patient Needs Assistance With Extra time to complete activity;Financial planner;Facilitation of hip flexion;Facilitation of trunk;Facilitation of weight shift;Cues for positioning of hands;Cues for sequencing;Balance;Facilitation of hip extension  Assist Level Dependent   Equipment Hand hold assist;Standard wheelchair   Comments ModA of OT to maintain upright posture and assist of tech to get hips over to sitting surface   Ambulation Transfer   Patient Needs Assistance With Extra time to complete the activity;Facilitation of trunk;Facilitation of weight shift;Cues for positioning of feet;Cues for positioning of hands;Cues for sequencing;Balance;Assist of two   Assist Level Dependent   Equipment Hand hold assist   Comments bed > recliner chair; Ax2, cues and assist to maintain upright posture   Assessment   Assessment Patient engaged and motivated to participate in occupational therapy. Completes shower with moderate assist and patient remaining engaged throughout task. Patient completes dressing and demo's good attention to task and participation throughout requiring assist due to visual difficulties. Patient requesting to sit up in recliner chair at end of session and a little tearful on OT exit. Therapist communicating to RN that patient enjoys listening to The Beatles when feeling sad. Patient progressing towards goals   Plan   OT Plan LUE NMR, BUE coordination/strengthening, visual scanning, sitting/standing balance, ADL participation   Recommendations   OT Discharge Recommendations Consistent supervision;Skilled nursing facility         Therapist: Phyliss Quale, OTD, OTR/L  Date: 11/20/2023

## 2023-11-21 ENCOUNTER — Inpatient Hospital Stay: Admit: 2023-11-22 | Payer: MEDICARE

## 2023-11-21 DIAGNOSIS — R4701 Aphasia: Secondary | ICD-10-CM

## 2023-11-21 DIAGNOSIS — I639 Cerebral infarction, unspecified: Secondary | ICD-10-CM

## 2023-11-21 DIAGNOSIS — E0800 Diabetes mellitus due to underlying condition with hyperosmolarity without nonketotic hyperglycemic-hyperosmolar coma (NKHHC): Secondary | ICD-10-CM

## 2023-11-21 DIAGNOSIS — K5731 Diverticulosis of large intestine without perforation or abscess with bleeding: Secondary | ICD-10-CM

## 2023-11-21 DIAGNOSIS — K921 Melena: Secondary | ICD-10-CM

## 2023-11-21 LAB — PROTIME INR (PT)
~~LOC~~ BKR INR: 1.1 (ref 0.9–1.2)
~~LOC~~ BKR PROTIME: 12 s (ref 9.9–14.2)

## 2023-11-21 LAB — CBC AND DIFF
~~LOC~~ BKR ABSOLUTE BASO COUNT: 0 10*3/uL (ref 0.00–0.20)
~~LOC~~ BKR ABSOLUTE EOS COUNT: 0.2 10*3/uL (ref 0.00–0.45)
~~LOC~~ BKR ABSOLUTE LYMPH COUNT: 1.5 10*3/uL (ref 1.00–4.80)
~~LOC~~ BKR ABSOLUTE MONO COUNT: 0.5 10*3/uL (ref 0.00–0.80)
~~LOC~~ BKR ABSOLUTE NEUTROPHIL: 5.1 10*3/uL (ref 1.80–7.00)
~~LOC~~ BKR BASOPHILS %: 0.5 % (ref 0.0–2.0)
~~LOC~~ BKR EOSINOPHILS %: 2.5 % (ref 0.0–5.0)
~~LOC~~ BKR HEMATOCRIT: 30 % — ABNORMAL LOW (ref 40.0–50.0)
~~LOC~~ BKR HEMOGLOBIN: 11 g/dL — ABNORMAL LOW (ref 13.5–16.5)
~~LOC~~ BKR LYMPHOCYTES %: 20 % — ABNORMAL LOW (ref 24.0–44.0)
~~LOC~~ BKR MCH: 31 pg (ref 26.0–34.0)
~~LOC~~ BKR MCHC: 36 g/dL — ABNORMAL HIGH (ref 32.0–36.0)
~~LOC~~ BKR MCV: 87 fL (ref 80.0–100.0)
~~LOC~~ BKR MONOCYTES %: 6.5 % (ref 4.0–12.0)
~~LOC~~ BKR MPV: 8.6 fL (ref 7.0–11.0)
~~LOC~~ BKR NEUTROPHILS %: 70 % (ref 41.0–77.0)
~~LOC~~ BKR PLATELET COUNT: 199 10*3/uL (ref 150–400)
~~LOC~~ BKR RBC COUNT: 3.5 10*6/uL — ABNORMAL LOW (ref 4.40–5.50)
~~LOC~~ BKR RDW: 12 % (ref 11.0–15.0)
~~LOC~~ BKR WBC COUNT: 7.2 10*3/uL (ref 4.50–11.00)

## 2023-11-21 LAB — ECG 12-LEAD
P AXIS: 43 degrees
P-R INTERVAL: 134 ms
Q-T INTERVAL: 396 ms
QRS DURATION: 120 ms
QTC CALCULATION (BAZETT): 465 ms
R AXIS: -67 degrees
T AXIS: 47 degrees
VENTRICULAR RATE: 83 {beats}/min

## 2023-11-21 LAB — POC GLUCOSE
~~LOC~~ BKR POC GLUCOSE: 170 mg/dL — ABNORMAL HIGH (ref 70–100)
~~LOC~~ BKR POC GLUCOSE: 121 mg/dL — ABNORMAL HIGH (ref 70–100)
~~LOC~~ BKR POC GLUCOSE: 169 mg/dL — ABNORMAL HIGH (ref 70–100)
~~LOC~~ BKR POC GLUCOSE: 205 mg/dL — ABNORMAL HIGH (ref 70–100)

## 2023-11-21 MED ORDER — MELATONIN 5 MG PO TAB
5 mg | Freq: Every evening | GASTROSTOMY | 0 refills | Status: SS
Start: 2023-11-21 — End: ?

## 2023-11-21 MED ORDER — SODIUM ZIRCONIUM CYCLOSILICATE 10 GRAM PO PWPK
10 g | Freq: Once | ORAL | 0 refills | Status: CP
Start: 2023-11-21 — End: ?
  Administered 2023-11-21: 16:00:00 10 g via ORAL

## 2023-11-21 MED ORDER — PEG-ELECTROLYTE SOLN 420 GRAM PO SOLR
0 refills | Status: SS
Start: 2023-11-21 — End: ?

## 2023-11-21 MED ORDER — ENOXAPARIN 40 MG/0.4 ML SC SYRG
40 mg | Freq: Every day | SUBCUTANEOUS | 0 refills | Status: SS
Start: 2023-11-21 — End: ?

## 2023-11-21 MED ORDER — DEXTROSE 50 % IN WATER (D50W) IV SYRG
12.5-25 g | INTRAVENOUS | 0 refills | Status: SS | PRN
Start: 2023-11-21 — End: ?

## 2023-11-21 MED ORDER — WHITE PETROLATUM-MINERAL OIL 57.7-31.9 % OP OINT
.25 [in_us] | Freq: Three times a day (TID) | OPHTHALMIC | 0 refills | Status: SS
Start: 2023-11-21 — End: ?

## 2023-11-21 MED ORDER — ARTIFICIAL TEARS (PF) SINGLE DOSE DROPS GROUP
2 [drp] | Freq: Three times a day (TID) | OPHTHALMIC | 0 refills | Status: SS
Start: 2023-11-21 — End: ?

## 2023-11-21 MED ORDER — TRAZODONE(#) 10MG/ML PO SUSP
12.5 mg | Freq: Two times a day (BID) | ORAL | 0 refills | Status: SS | PRN
Start: 2023-11-21 — End: ?

## 2023-11-21 MED ORDER — DOXAZOSIN 4 MG PO TAB
4 mg | Freq: Every evening | GASTROSTOMY | 0 refills | Status: SS
Start: 2023-11-21 — End: ?

## 2023-11-21 MED ORDER — ROSUVASTATIN 40 MG PO TAB
40 mg | Freq: Every day | GASTROSTOMY | 0 refills | Status: SS
Start: 2023-11-21 — End: ?

## 2023-11-21 MED ORDER — LIDOCAINE HCL 2 % MM JELP
TOPICAL | 0 refills | Status: SS | PRN
Start: 2023-11-21 — End: ?

## 2023-11-21 MED ORDER — MIRTAZAPINE 15 MG PO TAB
15 mg | Freq: Every evening | ORAL | 0 refills | Status: SS
Start: 2023-11-21 — End: ?

## 2023-11-21 MED ORDER — PEG-ELECTROLYTE SOLN 420 GRAM PO SOLR
2 L | Freq: Two times a day (BID) | ORAL | 0 refills | Status: DC
Start: 2023-11-21 — End: 2023-11-22
  Administered 2023-11-21: 22:00:00 2 L via ORAL

## 2023-11-21 MED ORDER — AMLODIPINE 5 MG PO TAB
5 mg | Freq: Every day | NASOGASTRIC | 0 refills | Status: SS
Start: 2023-11-21 — End: ?

## 2023-11-21 MED ORDER — THIAMINE MONONITRATE (VIT B1) 100 MG PO TAB
100 mg | Freq: Every day | NASOGASTRIC | 0 refills | Status: SS
Start: 2023-11-21 — End: ?

## 2023-11-21 MED ORDER — BISACODYL 5 MG PO TBEC
10 mg | ORAL | 0 refills | Status: DC | PRN
Start: 2023-11-21 — End: 2023-11-22

## 2023-11-21 MED ORDER — ACETAMINOPHEN 325 MG PO TAB
650 mg | GASTROSTOMY | 0 refills | Status: SS | PRN
Start: 2023-11-21 — End: ?

## 2023-11-21 MED ORDER — MODAFINIL 100 MG PO TAB
ORAL | 0 refills | Status: SS
Start: 2023-11-21 — End: ?

## 2023-11-21 MED ORDER — INSULIN ASPART 100 UNIT/ML SC FLEXPEN
0-12 [IU] | Freq: Four times a day (QID) | SUBCUTANEOUS | 0 refills | Status: SS
Start: 2023-11-21 — End: ?

## 2023-11-21 MED ORDER — SENNOSIDES 8.6 MG PO TAB
1 | Freq: Two times a day (BID) | GASTROSTOMY | 0 refills | Status: SS
Start: 2023-11-21 — End: ?

## 2023-11-21 MED ORDER — PROPRANOLOL 10 MG PO TAB
10 mg | Freq: Two times a day (BID) | NASOGASTRIC | 0 refills | Status: SS
Start: 2023-11-21 — End: ?

## 2023-11-21 MED ORDER — INSULIN ASPART 100 UNIT/ML SC FLEXPEN
17 [IU] | Freq: Four times a day (QID) | SUBCUTANEOUS | 0 refills | Status: SS
Start: 2023-11-21 — End: ?

## 2023-11-21 MED ORDER — PHENOL 1.4 % MM SPRA
2 | OROMUCOSAL | 0 refills | Status: SS | PRN
Start: 2023-11-21 — End: ?

## 2023-11-21 MED ORDER — SIMETHICONE 80 MG PO CHEW
80 mg | GASTROSTOMY | 0 refills | Status: DC | PRN
Start: 2023-11-21 — End: 2023-11-22

## 2023-11-21 MED ORDER — ASPIRIN 81 MG PO CHEW
81 mg | Freq: Every day | NASOGASTRIC | 0 refills | Status: SS
Start: 2023-11-21 — End: ?

## 2023-11-21 MED ORDER — OXYCODONE 5 MG PO TAB
2.5 mg | Freq: Two times a day (BID) | ORAL | 0 refills | Status: SS | PRN
Start: 2023-11-21 — End: ?

## 2023-11-21 MED ORDER — LIDOCAINE 5 % TP PTMD
1 | Freq: Every day | TOPICAL | 0 refills | Status: SS
Start: 2023-11-21 — End: ?

## 2023-11-21 MED ORDER — SENNOSIDES 8.6 MG PO TAB
1 | Freq: Two times a day (BID) | GASTROSTOMY | 0 refills | Status: DC
Start: 2023-11-21 — End: 2023-11-22
  Administered 2023-11-21: 14:00:00 1 via GASTROSTOMY

## 2023-11-21 MED ORDER — CLOPIDOGREL 75 MG PO TAB
75 mg | Freq: Every day | NASOGASTRIC | 0 refills | Status: SS
Start: 2023-11-21 — End: ?

## 2023-11-21 MED ORDER — DICLOFENAC SODIUM 1 % TP GEL
2 g | Freq: Four times a day (QID) | TOPICAL | 0 refills | Status: SS
Start: 2023-11-21 — End: ?

## 2023-11-21 MED ORDER — PEG-ELECTROLYTE SOLN 420 GRAM PO SOLR
2 L | ORAL | 0 refills | Status: DC | PRN
Start: 2023-11-21 — End: 2023-11-22

## 2023-11-21 MED ORDER — INSULIN GLARGINE 100 UNIT/ML (3 ML) SC INJ PEN
16 [IU] | Freq: Every day | SUBCUTANEOUS | 0 refills | Status: SS
Start: 2023-11-21 — End: ?

## 2023-11-21 NOTE — Progress Notes
 SPEECH-LANGUAGE PATHOLOGY       11/21/23 1100   Behavior   Comments* Pt resting in bed and remained in bed throughout session 2/2 RN needing to gather labs/vitals and pt report of fatigue. Pt participatory with encouragement. All reported needs met at SLP exit and call light within reach. Handoff completed with nursing staff following session.   Dysphagia Goals   Therapy Activities PO trials;Swallow exercises   Swallow exercises Effortful swallow;Pitch glide   PO Trials Ice chips   Therapy Activity Comments Pt initially agreeable to oral cares utilizing suction toothbrush/toothpaste while seated upright in bed, however after set-up provided, pt shaking head no. When asking, pt reported increased fatigue but agreeable to use of toothette swabs with mouthwash.    Pt denied PO trials ice chips, thin liquids (excluding hot coffee), and pureed/soft solids. Pt agreeable to PO trials hot coffee via cup while seated upright at 90 degrees in bed. Occasional hand under hand assist required for guiding cup to mouth in addition to occasional tactile cues for bolus size modifications + rate control. Occasional L-sided anterior loss noted with appropriate sensory awareness. Piecemeal vs multiple swallows noted. Pt presented with intermittent vocal wetness/throat clearing in addition to reflexive cough x3. Of note, pt additionally presented with nonproductive congestive cough intermittently prior to PO trials.     Attempted swallow exercises following PO trials however pt unable to consecutively complete effortful swallow and declining ice chips + additional PO trials for targeting. Attempted to introduce pitch glides this date however resulted in no response/initiation; anticipate fatigue contributing factor this date.   Attention Goals   Therapy Activities Sustained attention   Therapy Activity Comments Sustained attention targeted with participating in conversation topics of travel and music. SLP standing on pt R side in which pt response to and participatory in conversation. Pt demonstrated sustained attention to conversation with environmental/noise modifications without need for cues/redirection. Adequate topic maintenance noted. As conversation progressed, pt noted with increased fatigue and required use of choice questions to facilitate attention/processing with verbal response compared to open-ended questions.   Problem Solving Goals   Therapy Activities Sequencing   Therapy Activity Comments Sequencing targeted with donning/doffing pants/brief with bowel incontinence during session. RN present to provide assistance. Attempted verbal sequencing however limited response noted although pt engaging in action prior to SLP instruction occasionally. Pt continues to demonstrate improved response with functional tasks when providing verbal instruction prior to initiation of action/task with ongoing cues throughout sequencing + working memory of task.         Assessment Improved participation this session. 15 minutes medically excused 2/2 medical work-up for documented/noted bloody stools this date.   Plan Cognition:  -- O-log  -- Simple attention/memory  -- Visual scanning  -- Medication management  -- Finance management  -- Consider WAB-Bedside     Dysphagia:  -- PO trials thin/mildly thick liquids (consider trialing motivating drink choices to increase participation), pureed solids   -- EMST-Lite  -- Swallow exercises as able (effortful swallow, masako, lingual protrusion/holds)   -- Continue IOPI as able     Miriam Dills, MA CCC-SLP

## 2023-11-21 NOTE — Progress Notes
 Neurorehabilitation Psychology  10:17-10:46 Patient was seen for follow-up with no family or significant others present. Rehab physician was briefly present.    Patient was alert and open/responsive to inquiry. He was not oriented to location initially and when asked several minutes later with options provided, but identified location. Speech was mumbled at times and provider asked patient to repeat himself on occasion, and thought processes appeared linear. Eye contact was within social norms and non-verbal behaviors were within social norms. Patient shook providers hand multiple times. Mood was dysthymic and affect was congruent as patient was intermittently tearful.    Patient reported he has been good; however, noting he has felt lonely. Patient asked this provider when his daughter would visit and provider shared that they did not know when their daughter planned to visit next. He was not oriented to date. Unprompted, patient asked provider what today's date was. When discussing patient recently declining nursing cares and therapies, patient acknowledged he has done this. When asked why he has been refusing, patient reported he cannot remember.     Supportive therapy was provided regarding patient feeling lonely. Discussed his interest in guitar and singing, as well as listening to the Beatles. Discussed his recent medical complications. Will continue to follow and assess cognition, mood, pain management, and coping.    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 Provider did not administer cognitive screening. Will attempt to return to administer.  2. Patient may benefit from encouragement to engage in conversation with staff and visitors and participate in activities that he enjoys (listening to music).   3. Will continue to follow.    Morna Budge, Ph.D.  Licensed Psychologist  On Voalte
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
## 2023-11-21 NOTE — Consults
 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 male with history of BPH, diabetes mellitus, hypertension, hyperlipidemia, coronary artery disease status post PCI, Acute r pontomedullary infarct admitted to rehab. GI was consulted for hematochezia,     The patient was recently diagnosed with a stroke and has been managed with dual antiplatelet therapy (aspirin and Plavix). Over the past few days, he has experienced hematochezia, with passage of blood clots and maroon-colored stools noted yesterday. He denies melena, nausea, or vomiting.    He also reported abdominal pain, which has improved since initiating antibiotics for a recently diagnosed urinary tract infection.          Assessment/ Plan:     73 y.o. male with history of BPH, diabetes mellitus, hypertension, hyperlipidemia, coronary artery disease status post PCI, Acute r pontomedullary infarct admitted to rehab. GI was consulted for hematochezia.    # Hematochezia  # Recent stroke on aspirin and Plavix  # G tube   # History of H. 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  H. pylori stool antigen in 2018 was positive.  > Colonoscopy 2017: Moderate diverticulosis in sigmoid colon  > Hemoglobin 11 baseline around 14    Recommendations:  - Plan for  diagnostic colonoscopy to further evaluate ongoing hematochezia and identify potential lower gastrointestinal sources of bleeding.  - Also plan for EGD with biopsies to assess for H. pylori infection, given the patient?s history of H. pylori-associated gastritis and intestinal metaplasia.  - Continue tube feeding.  Start bowel prep through the gastric tube.  N.p.o. at midnight.   - CBC every 8 hours and transfuse as needed     Patient seen/discussed with Dr. Tobie Jonah Ellouise Valente, MD  Gastroenterology and Hepatology    -----------------------------  PMH:  Past Medical History:    Accidental fall    Angina pectoris    Arthritis    BPH (benign prostatic hyperplasia)    Cataract    Constipation    Coronary artery disease    DM (diabetes mellitus) (CMS-HCC)    ED (erectile dysfunction)    Heart attack (CMS-HCC)    Hyperlipidemia    Hypertension    Joint pain    Neuropathy  Shingles       Current medications:  No current facility-administered medications on file prior to encounter.     No current outpatient medications on file prior to encounter.       PSH:  Surgical History:   Procedure Laterality Date    CORONARY STENT PLACEMENT  2016    HX CHOLECYSTECTOMY  09/2015    COLONOSCOPY N/A 04/20/2016    Performed by Rogers Kipper, MD at [facility]    ESOPHAGOGASTRODUODENOSCOPY N/A 04/20/2016    Performed by Rogers Kipper, MD at [facility] ENDO    ESOPHAGOGASTRODUODENOSCOPY BIOPSY  04/20/2016    Performed by Rogers Kipper, MD at [facility] ENDO    UREA BREATH TEST N/A 11/13/2016    Performed by Ledora Catalina, MD at [facility] ENDO       SH:  Social History     Socioeconomic History    Marital status: Separated    Number of children: 3   Tobacco Use    Smoking status: Former     Current packs/day: 0.00     Average packs/day: 2.0 packs/day for 10.0 years (20.0 ttl pk-yrs)     Types: Cigarettes     Start date: 04/11/1961     Quit date: 04/12/1971     Years since quitting: 52.6    Smokeless tobacco: Never    Tobacco comments:     Former smoker/quit years ago   Vaping Use    Vaping status: Never Used   Substance and Sexual Activity    Alcohol use: Not Currently    Drug use: Never    Sexual activity: Not Currently     Partners: Female     Birth control/protection: None       FH:  Family History   Problem Relation Name Age of Onset    Diabetes Mother AMPARO CUBIAS     Diabetes Father JOSE Ciulla     Cancer-Lung Father JOSE Cuadras     Melanoma Neg Hx      Cataract Neg Hx      Glaucoma Neg Hx      Macular Degen Neg Hx         Review of Systems:  10 point ROS negative except as above.  Please see HPI for additional pertinent documentation    Physical Exam:  Vitals:    11/21/23 0536 11/21/23 0844 11/21/23 1048 11/21/23 1400   BP: 104/40 (!) 111/39 133/50 (!) 142/48   BP Source: Arm, Left Upper Arm, Left Upper Arm, Right Upper Arm, Left Upper   Pulse: 75 80 85 88   Temp:   36.4 ?C (97.6 ?F) 36.6 ?C (97.8 ?F)   SpO2: 98% 95% 99% 98%   O2 Device: None (Room air) None (Room air) None (Room air) None (Room air)   O2 Liter Flow:       Weight:       Height:         Constitutional- Vitals above, no acute distress.   Head - Normocephalic, atraumatic.   Eyes -  EOMI grossly. No icterus or injection.   Ears, nose, mouth, throat- No oral ulcer or bleeding.   Neck - No swelling or tracheal deviation.   Respiratory - Symmetric chest rise, no increased work of breathing.  Cardiovascular - Peripheral pulses intact, no pedal edema.  Gastrointestinal - Soft, nondistended, nontender to palpation.  Skin - No exposed rash, lesion.  Neurologic - No CN deficit, normal fluid speech  Psychiatric - Judgement intact, thought content appropriate.  Labs/Imaging:  Pertient labs/imaging were reviewed on initiation of progress note.

## 2023-11-21 NOTE — Progress Notes
 Called report to [place], RN on [phone].

## 2023-11-21 NOTE — Discharge Instructions - Pharmacy
 Discharge Summary      Name: Kevin Obrien  Medical Record Number: [id number]        Account Number:  [id number]  Date Of Birth:  1950/12/19                         Age:  73 y.o.   Admit date:  10/28/2023                     Discharge date:  11/21/2023      Discharge Attending:  Dr. 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)    Weak urinary stream    Diabetic polyneuropathy associated with type 2 diabetes mellitus   (CMS-HCC)    Postherpetic neuralgia    Essential tremor    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    Poorly controlled diabetes mellitus (CMS-HCC)    Nephrotic range proteinuria    Ischemic stroke (CMS-HCC)    Moderate malnutrition    On enteral nutrition    Adjustment disorder with depressed mood    Expressive aphasia     Present on Admission:   Ischemic stroke (CMS-HCC)   Poorly controlled diabetes mellitus (CMS-HCC)   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   Essential tremor   Diabetic polyneuropathy associated with type 2 diabetes mellitus (CMS-HCC)   Weak urinary stream   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)   Third nerve palsy of left eye   Peripheral artery disease   Neuropathy   Abdominal pain   Hyperlipidemia   Diabetes mellitus (CMS-HCC)   Nephrotic range proteinuria   Postherpetic neuralgia   History of Helicobacter pylori infection   Moderate malnutrition   Acute ischemic stroke (CMS-HCC)      Significant Past Medical History        Accidental fall  Angina pectoris  Arthritis  BPH (benign prostatic hyperplasia)  Cataract  Constipation  Coronary artery disease  DM (diabetes mellitus) (CMS-HCC)  ED (erectile dysfunction)  Heart attack (CMS-HCC)  Hyperlipidemia  Hypertension  Joint pain  Neuropathy  Shingles    Allergies   Seasonal allergies    Brief Hospital Course   The patient was admitted and the following issues were addressed during this hospitalization: (with pertinent details including admission exam/imaging/labs).      Kevin Obrien is a 73 y.o. male with a notable past medical history of BPH, CAD, T2DM, HLD, HTN who presented for inpatient rehabilitation after acute right pontine-medullary CVA.  Acute care hospital course was notable for initiation of DAPT (aspirin and plavix) for 3 months followed by aspirin monotherapy per Neurology, severe dysphagia leading to PEG tube placement, new neurologic findings of bell's palsy, endocrinology consult in the setting of  uncontrolled type 2 diabetes (A1c 11.7%). Impairments on admission to inpatient rehabilitation included weakness, visual impairment, bilateral horizontal gaze palsy, diplopia, L facial paralysis (bells palsy), R facial weakness (brainstem stroke), dysphagia, dysarthria, impaired mobility and activities of daily living secondary to stroke. The prior functional status independent with mobility and ADLs. Patient was requiring Hoyer lift for transfers, moderate assist with bed mobility, total assist with hygiene, and remained NPO (Except for ice chips)    During patient's rehabilitation course they participated with PT, OT, SLP, and neuropsychology and made functional gains as detailed below.  Medical course while admitted to rehabilitation was notable for concern regarding post-stroke somnolence for which Neurology was consulted, endocrinology assistance for diabetes management (A1c 11.7%), nystatin swish and spit (5/20-6/9) for oral thrush, brief pain at TMJ which spontaneously resolved, psychiatry consult for mood and patient's fluctuating depression intermittently impacting ability to complete therapy, initiation of modafinil titrated up to 200mg  in the AM ~0530 and 100mg  ~1000 to improve alertness and participation in therapy in the setting of post-stroke sleepiness, urinalysis notable for UTI x2 most recently treated with ceftriaxone 6/10-6/12, AKI for which internal medicine was consulted s/p 500cc IV fluids, total of x3 bloody bowel movements with subsequent GI consult 6/12 and advisement per internal medicine team to directly transfer patient to [facility] for further monitoring and treatment. Patient continues to be NPO with ice chips per SLP evaluation. He continues to require intermittent straight catheterization secondary to neurogenic bladder leading to retention, for which doxazosin was started and titrated up to 4mg  dose. Of note, metoprolol was replaced with propranolol 10mg  bid given benefit in the setting of agitation prevention.     Rehab QI:     Evaluation QI Current QI   Sit to Stand Assist Level: Dependent Assist Level: Partial/moderate assistance   Gait Distance: 5 + 10 + 20 Distance: 400   Gait Assist Level: Dependent Assist Level: Dependent   Gait Equipment: Roller walker, Wheelchair follow Equipment: Specialty walker with harness ([place])   Wheelchair Distance 1: From room to therapy gym Distance 1: 15'   Wheelchair Assist Level: Substantial/maximal assistance Assist Level: Partial/moderate assistance   Stairs Assist Level: Dependent (minimal to moderate assist of 2) Assist Level: Substantial/maximal assistance   Eating Assist Level: Not attempted due to medical condition or safety concerns Assist Level: Partial/moderate assistance   Grooming Assist Level: Dependent Assist Level: Setup or clean-up assistance   Bathing Assist Level: Substantial/maximal assistance Assist Level: Partial/moderate assistance   Upper Body Dressing Assist Level: Substantial/maximal assistance Assist Level: Substantial/maximal assistance   Lower Body Dressing Assist Level: Substantial/maximal assistance Assist Level: Substantial/maximal assistance   Toileting Assist Level: Dependent Assist Level: Substantial/maximal assistance   Toilet Transfer    Assist Level: Dependent    Assist Level: Substantial/maximal assistance   Tub/Shower Transfer Assist Level: Dependent Assist Level: Partial/moderate assistance    Patient continent of bladder?: Yes, but no urine output Patient continent of bladder?: No    Patient continent of bowel?: No Patient continent of bowel?: No    PO: Ice chips only PO: Ice chips only    Orientation Log Score (Out of 30): 23/30 Orientation Log Score (Out of 30): 7/30                                                PT recommended equipment: Too early  to determine (would likely benefit from custom wc if dc home, sara steady)   OT recommended equipment: Tub transfer bench                   Items Needing Follow Up   Pending items or areas that need to be addressed at follow up:     - workup for GI bleed  - ongoing endocrinology management of type 2 diabetes  - nutrition consult for tube feeds 2/2 dysphagia following stroke  - recommend ongoing psychiatry consult for follow up of tearfulness, adjustment to new deficits  - family meeting was scheduled for 6/13 prior to pt discharge to acute care hospital; recommend eventual family meeting to assist family in understanding of patient's new functional status and safe discharge planning      Pending Labs and Follow Up Radiology    Pending labs and/or radiology review at this time of discharge are listed below: if this area is blank, there are no items for review.         Medications      Medication List      START taking these medications     acetaminophen 325 mg tablet; Commonly known as: TYLENOL; Dose: 650 mg;   Take two tablets via feeding tube every 6 hours as needed for Pain.   Indications: pain; For: pain; Refills: 0   amLODIPine 5 mg tablet; Commonly known as: NORVASC; Dose: 5 mg; one   tablet by Per NG tube route daily. Indications: high blood pressure; For:   high blood pressure; Refills: 0; Start taking on: November 22, 2023   artificial tears (PF) single dose ophthalmic solution; Dose: 2 drop;   Apply two drops to both eyes three times daily. Indications: dry eye; For:   dry eye; Refills: 0   aspirin 81 mg chewable tablet; Dose: 81 mg; one tablet by Per NG tube   route daily. Indications: stroke prevention; For: stroke prevention;   Refills: 0; Start taking on: November 22, 2023   clopiDOGreL 75 mg tablet; Commonly known as: PLAVIX; Dose: 75 mg; one   tablet by Per NG tube route daily. Indications: prevention for a blood   clot going to the brain; For: prevention for a blood clot going to the   brain; Refills: 0; Start taking on: November 22, 2023   dextrose 50% syringe; Commonly known as: D50; Dose: 12.5-25 g;   Administer 25-50 mL through vein as Needed (=< 70 mg/dL: See admin   instructions). Indications: hypoglycemia; For: hypoglycemia; Refills: 0   diclofenac sodium 1 % topical gel; Commonly known as: VOLTAREN; Dose: 2   g; Apply two g topically to affected area four times daily. Indications:   pain; For: pain; Refills: 0   doxazosin 4 mg tablet; Commonly known as: CARDURA; Dose: 4 mg; Take one   tablet via feeding tube at bedtime daily. Indications: enlarged prostate   with urination problem; For: enlarged prostate with urination problem;   Refills: 0   enoxaparin 40 mg injection syringe; Commonly known as: LOVENOX; Dose: 40   mg; Inject 0.4 mL under the skin daily. Indications: deep vein thrombosis   prevention; For: deep vein thrombosis prevention; Refills: 0   * insulin aspart (U-100) 100 unit/mL (3 mL) PEN; Commonly known as:   NOVOLOG FLEXPEN U-100 INSULIN; Dose: 0-12 Units; Inject zero Units to   twelve Units under the skin four times daily. Correction Factor if: BS   181-220=2 units, BS 221-260=4 units, BS 261-300=6  units, BS 301-350=8   units, BS 351-400=10 units, BS >400=12 units  Indications: type 2 diabetes   mellitus; For: type 2 diabetes mellitus; Refills: 0   * insulin aspart (U-100) 100 unit/mL (3 mL) PEN; Commonly known as:   NOVOLOG FLEXPEN U-100 INSULIN; Dose: 17 Units; Inject seventeen Units   under the skin four times daily. Indications: type 2 diabetes mellitus;   For: type 2 diabetes mellitus; Refills: 0   insulin glargine 100 unit/mL (3 mL) subcutaneous PEN; Commonly known as:   LANTUS SOLOSTAR U-100 INSULIN; Dose: 16 Units; Doctor's comments: The   pharmacist may select Lantus Solorstar or Annie Purpura based on what   is cheaper for the patient.; Inject sixteen Units under the skin daily.   Indications: type 2 diabetes mellitus; For: type 2 diabetes mellitus;   Refills: 0; Start taking on: November 22, 2023   lidocaine 5 % topical patch; Commonly known as: LIDODERM; Dose: 1 patch;   Apply one patch topically to affected area daily. Apply patch for 12   hours, then remove for 12 hours before repeating.  Indications:   neuropathic pain; For: neuropathic pain; Refills: 0; Start taking on: November 22, 2023   lidocaine hcl 2 % mucosal jelly; Commonly known as: XYLOCAINE; Apply    topically to affected area as Needed. Indications: local anesthesia for   urethral pain; For: local anesthesia for urethral pain; Refills: 0   melatonin 5 mg tablet; Dose: 5 mg; Take one tablet via feeding tube at   bedtime daily. Indications: difficulty sleeping; For: difficulty sleeping;   Refills: 0   mirtazapine 15 mg tablet; Commonly known as: REMERON; Dose: 15 mg; Take   one tablet by mouth at bedtime daily. Indications: major depressive   disorder; For: major depressive disorder; Refills: 0   modafiniL 100 mg tablet; Commonly known as: PROVIGIL; Take two tablets   by mouth daily AND one tablet daily. Indications: post-stroke sleepiness;   For: post-stroke sleepiness; Refills: 0; Start taking on: November 22, 2023   oxyCODONE 5 mg tablet; Commonly known as: ROXICODONE; Dose: 2.5 mg; Take   one-half tablet by mouth every 12 hours as needed. Indications: pain; For:   pain; Refills: 0   * peg-electrolyte solution 420 gram oral solution; Commonly known as:   NULYTELY; Mix as directed on package. Drink (8oz) every 10 minutes   until gone. Refrigerate once mixed.  Indications: emptying of the bowel;   For: emptying of the bowel; Refills: 0   * peg-electrolyte solution 420 gram oral solution; Commonly known as:   NULYTELY; Mix as directed on package. Drink (8oz) every 10 minutes   until gone. Refrigerate once mixed.  Indications: emptying of the bowel;   For: emptying of the bowel; Refills: 0   petrolatum 57.7-31.9 % Oint ophthalmic ointment; Commonly known as:   STYE; Dose: 0.25 inch; Apply one-quarter inch to both eyes three times   daily. Indications: dry eye; For: dry eye; Refills: 0   phenoL 1.4 % mouth spray; Commonly known as: CHLORASEPTIC; Dose: 2   spray; Take two sprays by mouth or throat as directed as Needed.   Indications: irritation of the mouth; For: irritation of the mouth;   Refills: 0   propranoloL 10 mg tablet; Commonly known as: INDERAL; Dose: 10 mg; one   tablet by Per NG tube route twice daily. Indications: high blood pressure;   For: high blood pressure; Refills: 0   rosuvastatin 40 mg tablet; Commonly known as:  CRESTOR; Dose: 40 mg; Take   one tablet via feeding tube daily. Indications: excessive fat in the   blood; For: excessive fat in the blood; Refills: 0; Start taking on: November 22, 2023   senna 8.6 mg tablet; Commonly known as: SENOKOT; Dose: 1 tablet; Take   one tablet via feeding tube twice daily. Indications: constipation; For:   constipation; Refills: 0   thiamine mononitrate (vit B1) 100 mg tablet; Dose: 100 mg; one tablet by   Per NG tube route daily. Indications: deficiency in thiamine or vitamin   B1; For: deficiency in thiamine or vitamin B1; Refills: 0; Start taking   on: November 22, 2023   trazodone(#) 10 mg/mL; Commonly known as: DESYREL; Dose: 12.5 mg; Take   1.25 mL by mouth twice daily as needed. Indications: PRN for agitation;   For: PRN for agitation; Refills: 0  * This list has 4 medication(s) that are the same as other medications   prescribed for you. Read the directions carefully, and ask your doctor or   other care provider to review them with you.       Return Appointments and Scheduled Appointments     Scheduled appointments:      Dec 25, 2023 3:30 PM  Follow-up visit with Morna CHRISTELLA Foyer, APRN-NP  Neurology: [facility] (Neurology) [address]  [place]  [place] [address]  [phone]          PCP follow-up: within 7-14 days of follow up    Consults, Procedures, Diagnostics, Micro, Pathology   Consults: GI, Internal Medicine, and Neurology  Surgical Procedures & Dates: None  Significant Diagnostic Studies, Micro and Procedures: noted in brief hospital course  Significant Pathology: noted in brief hospital course  Nutrition:  Malnutrition present on admission  ICD-10 code E44: Chronic illness/Moderate non-severe malnutrition      Mild loss of muscle mass, Mild loss of body fat, Energy intake: Less than 75% of estimated energy requirement for 1 month or more        Loss of Subcutaneous Fat: Yes Mild Orbital, Triceps  Muscle Wasting: Yes Moderate Clavicle, Temple, Deltoid               Malnutrition Interventions: Continue to provide EN to meet 100% estimated protein and kcal needs                  Discharge Disposition, Condition   Patient Disposition: ASC to [facility] unit (inpatient class) [394]  Condition at Discharge: Stable    Code Status   Full Code    Patient Instructions     Activity       Activity as Tolerated   As directed      as per therapists recommendation          Diet       Tube Feeding   As directed      Recommend Jevity 1.5 bolus feeds, 5 cartons/day administered as 1.25 cartons or 294mL QID  Recommend 30mL free H2O flush before and after each bolus          Wounds/Lines/Drains       Gastro-Jejunal Tube Care   As directed      *WASH HANDS PRIOR TO HANDLING.    *The tube has some slack to prevent skin breakdown, but should not slide in and out all the time.    *If you have increasing pain, redness, or swelling at the entry site, this can be a sign  of infection; you need to stop feeds and call your doctor immediately.   *Minor leakage around the tube is expected and not a sign of a major problem, but if you continue to have problems or questions, please call.   *It is okay to shower with the drain.  Make sure the entry site is dry before replacing a dressing.   *IF THE TUBE IS PULLED OUT, PLEASE GO TO THE EMEREGENCY ROOM.  A NEW TUBE NEEDS TO BE PLACED IMMEDIATELY.              Discharge education provided to patient., Signs and Symptoms:   Report these signs and symptoms       Report These Signs and Symptoms   As directed      Please contact your doctor if you have any of the following symptoms: temperature higher than 100.4 degrees F, uncontrolled pain, persistent nausea and/or vomiting, difficulty breathing, chest pain, or severe abdominal pain        , Education: , and Others Instructions:   Other Orders       Questions About Your Stay   Complete by: As directed      Discharging attending physician: SARMIENTO, KHULAN Z    Order comments: For questions or concerns regarding your hospital stay, call [phone].    Stroke Information   Complete by: As directed      Order comments: B.E. F.A.S.T. is an easy way to remember the sudden signs of stroke.    B.E. F.A.S.T. Stands for...  B  -  Balance  Is there a sudden difficulty with balance or coordination? Is walking or sitting difficult?  E  -  Eyes  Is there a sudden change in eyesight such as blurred vision, double vision, or a loss of vision in one or both eyes without pain?   F  -  Face   Does one side of the face droop or is it numb?  Ask the person to smile.  A  -  Arm   Is one arm weak or numb?  Ask the person to raise both arms.  Does one arm drift downward?  S  -  Speech   Is speech slurred or difficult to understand?  Are they unable to speak at all?  Ask the person to repeat is simple sentence like It is a bright and sunny day.    T  -  Time to call 911!  If a person shows any of these symptoms, even if they go away, call 911 to get them to the hospital immediately.  Time is very important.  The sooner they get to the hospital, the more likely they are to receive stroke reversing and potentially life-saving treatment.    Traits and lifestyle habits that increase your risk for stroke include prior stroke/TIA, age, high blood pressure, diabetes mellitus, high cholesterol, and poor diet.    Continue your medications as ordered after discharge. These medications will help reduce your risk of another stroke.    Abnormal lipids (fats in blood) Risk  Goal: Total Cholesterol: <200  LDL (bad cholesterol): <100  HDL (good cholesterol): >40 for men, >50 for women  Triglycerides: <150  Your Numbers: Cholesterol       Date                     Value               Ref Range  Status                10/17/2023               216 (H)             <200 mg/dL          Final                 09/01/2015               159                 <200 MG/DL          Final            ----------  LDL       Date                     Value               Ref Range           Status                10/17/2023               161 (H)             <100 mg/dL          Final                 09/01/2015               84                  <100 MG/DL          Final            ----------  HDL       Date                     Value               Ref Range           Status                10/17/2023               36 (L)              >40 mg/dL           Final                 09/01/2015               27 (L)              >40 MG/DL           Final ----------  Triglycerides       Date                     Value               Ref Range           Status                10/17/2023               219 (H)             <150 mg/dL  Final                 09/01/2015               299 (H)             <150 MG/DL          Final            ----------  Plan: Diets high in saturated fat, trans fat and cholesterol can raise blood cholesterol levels increasing your risk of stroke.  Take medication as prescribed and eat a heart healthy, low sodium diet.      It is important that you follow-up with your primary care physician 4 to 6 weeks after discharge. Be sure to keep all follow-up appointments. Please bring your discharge instructions with you to your follow-up appointments.    Should you have any questions once you have returned home, please feel free to contact the Stroke Program Coordinator at [phone].    Your last blood pressure was BP Readings from Last 1 Encounters:  11/21/23 : (!) 142/48   .    If you have diabetes, even if treated, you are at an increased risk of stroke.  Many people with diabetes also have high blood pressure, high cholesterol or are overweight.  This increases your risk even more.      Smoking doubles your risk of stroke.  Quitting can greatly reduce your risk.  For more information on smoking cessation call [phone] or visit [email or web address].              Additional Orders: Case Management, Supplies, Home Health     Home Health/DME       None              Signed:  Corean CHRISTELLA Brewster, DO  11/21/2023      cc:  Primary Care Physician:  Bernardino Bring R       Referring physicians:  No ref. provider found   Additional provider(s):        Did we miss something? If additional records are needed, please fax a request on office letterhead to [phone]. Please include the patient's name, date of birth, fax number and type of information needed. Additional request can be made by email at [email or web address]. For general questions of information about electronic records sharing, call [phone].

## 2023-11-21 NOTE — Progress Notes
 Acute Inpatient Rehabilitation Behavior Summary of Shift    Name: Kevin Obrien   MRN: [id number]     DOB: [birth year]      Age: 73 y.o.  Admission Date: 10/28/2023     LOS: 24 days     Date of Service: 11/21/2023      Reason for increased observation? Impulsivity, poor safety awareness, pulling at lines  One-on-one safety watch: No  Q15-30 minute safety watch: No  Suicide precaution: No  Telemonitoring: yes    Behavior outbursts during this shift? No  Description and time(s) of event:    Agitation during this shift? Yes  Description and time(s) of event: with care patient refused bladder scan, ISC and temperature to be taken this morning. Patient covered his head with blanket and pulled blanket up over stomach after tube feed.      Active suicide ideation during this shift? No   Description and time(s) of event:     Poor safety awareness or impulsivity during this shift? Yes  Description and time(s) of event: patient attempted to get out of bed around 0350, patients daughter called for assistance unsure if patient had gotten him self partially to side of bed or had help from family.    Noteworthy visitor interactions during this shift? Daughter at bedside  Sleep/rest patterns observed during this shift? Patient was awake partially due to abdominal discomfort and needing to use restroom  Therapeutic interventions:    Music, Redirection, Reorientation, and Stop activity  Additional Comments:

## 2023-11-21 NOTE — Progress Notes
 OCCUPATIONAL THERAPY  NOTE   Name: Kevin Obrien   MRN: [id number]     DOB: [birth year]      Age: 73 y.o.  Admission Date: 10/28/2023     LOS: 24 days     Date of Service: 11/21/2023       11/21/23 1300   Type of Note   Type of Note Daily   Time Calculation   Start Time 1300   Stop Time 1330   Time Calculation 30   Missed Minutes 15 Minutes   Reasons for Missed Minutes Refused  (reports not feeling well/several red stools today)   Subjective   Subjective Pt resting in bed and remained in bed throughout session due to pt report of fatigue. Pt engaged in therapeutic communication and listening to music for part of session and requesting to be alone despite encouragement. All needs met/in reach on OT exit and handoff with RN following session.   Precautions   Comments L sided weakness, visual impairment (horizontal gaze palsy), PEG   Cognitive   Orientation Person  (unable to verbalize birthday)   Patient Behavior Withdrawn;Calm   Cognition Decrease attention/ concentration;Poor Safety Awareness   Therapeutic Interventions   Therapeutic Activities Pt engaged in therapeutic participation of listening to music and engaged in conversation with encouragement. Therapist opened blinds in room and provided options of activities to participate in. Pt declining to participate in therapy while in bed or out of bed reporting not feeling well. Therapist reoriented patient to place, time, and situation. Patient requesting to bed alone and for therapist to come back tomorrow.   Assessment   Assessment Pt initially agreeable to therapy. Engaged in therapeutic conversation and listening to The Beatles at start of session. Pt declining to participate in therapy and requesting to be alone asking therapist to come back tomorrow.   Plan   OT Plan LUE NMR, BUE coordination/strengthening, visual scanning, sitting/standing balance, ADL participation   Recommendations   OT Discharge Recommendations Consistent assistance;Skilled nursing facility         Therapist: Phyliss Quale, OTD, OTR/L  Date: 11/21/2023

## 2023-11-21 NOTE — H&P (View-Only)
 Admission History and Physical Note      Name:  Kevin Obrien                       MRN:  [id number]                Admission Date: 11/21/2023                     Primary Care Physician: Bernardino Velma SAUNDERS      Chief Complaint: Hematochezia        Assessment/Plan:        This is a  73 y.o. male with past medical history of BPH, diabetes mellitus, hypertension, hyperlipidemia, coronary artery disease status post PCI, Acute right pontomedullary infarct admitted to rehab, transferred now for hematochezia.     Hematochezia  - Has bright red blood in stools   - HB on 6/12 AM: 11  - Iron studies 11/18/2023: Iron 104, % saturation 26, TIBC 398, ferritin 208, transferrin 267  - Currently on aspirin  and Plavix  for recent stroke  Plan:  - Gastroenterology following.  Plan for EGD and colonoscopy on 11/22/23  - Hold Plavix  for now  - Protonix  40 mg twice daily  - CBC every 8 hours  - Blood transfusion consent obtained  - Keep NPO    UTI  - Urine culture from 11/17/2023: >100,000 E coli resistant to Bactrim   - Currently on ceftriaxone   Plan:  - Continue Ceftriaxone     Hypertension  - Continue PTA amlodipine  5 mg, propranolol  10 mg twice daily    CAD status post PCI  - Continue PTA aspirin     Recent right pontomedullary CVA  - Hold PTA Plavix  as above  - Continue PTA aspirin , atorvastatin   - PT/OT    Type 2 diabetes  - Continue PTA Lantus  17 units  - LDCF    Hyperlipidemia  - Continue PTA rosuvastatin  40 mg        Diet: NPO  DVT PPx: Holding in setting of GI bleed  Code Status: Full code      Chrisandra Hives MD  Internal Medicine  Med Private Night 5    If any questions or concerns, Please contact Med Private Night    Note: For any questions/concerns; please page the Team Pager.      I spent a total of 77 minutes of direct patient care in addition to overall management include ordering/reviewing labs, radiology, serial assessment and monitoring regarding patient's condition, discussing with consulting services, disposition plan. Updating the patient and family with the plan of care.        History of Present Illness:     Kevin Obrien is a 73 y.o. male with significant history of 73 y.o. male with past medical history of BPH, diabetes mellitus, hypertension, hyperlipidemia, coronary artery disease status post PCI, Acute right pontomedullary infarct admitted to rehab, transferred now for hematochezia.       History is mainly obtained from patient's family present at bedside.  Patient's family report that he has been having bright red blood in his stools.  It was most severe yesterday but reports having a few episodes last week as well.  His family reports that 1 year ago, he had similar symptoms and was supposed to get a colonoscopy but they do not think he ever got it.  He denies any fevers, chills, abdominal pain, shortness of breath, nausea, vomiting, diarrhea, dysuria.    Review of  Systems:    Review of Systems   Constitutional:  Negative for chills and fever.   Cardiovascular:  Negative for chest pain and leg swelling.   Gastrointestinal:  Positive for blood in stool. Negative for abdominal pain, constipation, diarrhea, nausea and vomiting.   Genitourinary:  Negative for dysuria.   Neurological:  Negative for headaches.       Past Medical History:    Accidental fall    Angina pectoris    Arthritis    BPH (benign prostatic hyperplasia)    Cataract    Constipation    Coronary artery disease    DM (diabetes mellitus) (CMS-HCC)    ED (erectile dysfunction)    Heart attack (CMS-HCC)    Hyperlipidemia    Hypertension    Joint pain    Neuropathy    Shingles       Surgical History:   Procedure Laterality Date    CORONARY STENT PLACEMENT  2016    HX CHOLECYSTECTOMY  09/2015    COLONOSCOPY N/A 04/20/2016    Performed by Rogers Kipper, MD at [facility] ENDO    ESOPHAGOGASTRODUODENOSCOPY N/A 04/20/2016    Performed by Rogers Kipper, MD at [facility] ENDO    ESOPHAGOGASTRODUODENOSCOPY BIOPSY  04/20/2016    Performed by Rogers Kipper, MD at [facility] ENDO    UREA BREATH TEST N/A 11/13/2016    Performed by Ledora Catalina, MD at [facility] ENDO       Family History   Problem Relation Name Age of Onset    Diabetes Mother AMPARO CUBIAS     Diabetes Father JOSE Zelenak     Cancer-Lung Father JOSE Bergeron     Melanoma Neg Hx      Cataract Neg Hx      Glaucoma Neg Hx      Macular Degen Neg Hx         Social History     Tobacco Use    Smoking status: Former     Current packs/day: 0.00     Average packs/day: 2.0 packs/day for 10.0 years (20.0 ttl pk-yrs)     Types: Cigarettes     Start date: 04/11/1961     Quit date: 04/12/1971     Years since quitting: 52.6    Smokeless tobacco: Never    Tobacco comments:     Former smoker/quit years ago   Vaping Use    Vaping status: Never Used   Substance and Sexual Activity    Alcohol use: Not Currently    Drug use: Never    Sexual activity: Not Currently     Partners: Female     Birth control/protection: None        All the histories listed above; including Past Medical History, Past Surgical History, Family History and Social History have been reviewed.    Immunizations (includes history and patient reported):   Immunization History   Administered Date(s) Administered    COVID-19 (PFIZER), mRNA vacc, 30 mcg/0.3 mL (PF) 08/24/2019, 09/14/2019, 03/23/2020    DT Vaccine 02/12/2008    Flu Vaccine =>3 YO (Historical) 05/13/2012    Flu Vaccine =>65 YO High-Dose (PF) 03/28/2016, 05/01/2018    Pneumococcal Vaccine (23-Val Adult) 05/13/2012    Pneumococcal Vaccine(13-Val Peds/immunocompromised adult) 01/23/2016    Varicella-Zoster Vaccine - live (ZOSTAVAX) 02/27/2016           Allergies:  Seasonal allergies    Medications:  No current facility-administered medications for this encounter.       Physical Exam:  Vital Signs:  Last Filed In 24 Hours Vital Signs: 24 Hour Range   BP: 140/52 (06/12 2323)  Temp: 36.9 ?C (98.4 ?F) (06/12 2323)  Pulse: 68 (06/12 2323)  Respirations: 18 PER MINUTE (06/12 2323)  SpO2: 98 % (06/12 2323)  O2 Device: None (Room air) (06/12 2323)  Height: 165.1 cm (5' 5) (06/12 2323) BP: (104-144)/(39-54)   Temp:  [36.4 ?C (97.5 ?F)-36.9 ?C (98.4 ?F)]   Pulse:  [66-88]   Respirations:  [16 PER MINUTE-18 PER MINUTE]   SpO2:  [95 %-99 %]   O2 Device: None (Room air)            Physical Exam  Vitals reviewed.   Constitutional:       Appearance: Normal appearance.   HENT:      Head: Normocephalic and atraumatic.   Eyes:      Extraocular Movements: Extraocular movements intact.   Cardiovascular:      Rate and Rhythm: Normal rate and regular rhythm.      Heart sounds: Normal heart sounds.   Pulmonary:      Effort: Pulmonary effort is normal. No respiratory distress.      Breath sounds: No wheezing or rales.   Abdominal:      General: There is no distension.      Palpations: Abdomen is soft.      Tenderness: There is no abdominal tenderness.   Musculoskeletal:      Right lower leg: No edema.      Left lower leg: No edema.   Neurological:      Mental Status: He is alert.   Psychiatric:         Mood and Affect: Mood normal.           Lab/Radiology/Other Diagnostic Tests:  24-hour labs:  No results found for this visit on 11/21/23 (from the past 24 hours).    Pertinent radiology reviewed.

## 2023-11-21 NOTE — Progress Notes
 PHYSICAL THERAPY  NOTE      Name: Kevin Obrien   MRN: [id number]     DOB: Jun 21, 1950      Age: 73 y.o.  Admission Date: 10/28/2023     LOS: 24 days     Date of Service: 11/21/2023         11/21/23 0930   Type of Note   Type of Note Daily   Time Calculation   Start Time 0930   Stop Time 1015   Time Calculation 45   $$ PT Therapeutic Activity 3 units   Subjective   Subjective Patient in bed upon arrival, agreeable to PT. Back in bed for EKG at end of session, RN present.   Precautions   Comments L sided weakness, visual impairment (horizontal gaze palsy), PEG   Lying to Sitting on Side of Bed   Patient Needs Assistance With Trunk;Extra time to complete the activity   Assist Level Partial/moderate assistance   Equipment HOB elevated;With rails   Comments mod A   Sit to Lying   Patient Needs Assistance With Trunk;Bilateral lower extremities   Assist Level Substantial/maximal assistance   Equipment No rails;HOB flat   Sit to and from Stand   Sit to and from Stand Sit to stand;Stand to sit   Sit to Stand   Patient Needs Assistance With Facilitation of trunk;Facilitation of weight shift;Balance;Cues for positioning of hands;Cues for positioning of feet;Financial planner;Extra time to complete activity   Assist Level Partial/moderate assistance   Equipment Hand hold assist;Bed rail   Comments mod A   Stand to Sit   Patient Needs Assistance With Facilitation of weight shift;Balance;Extra time to complete activity;Financial planner;Facilitation of hip flexion;Facilitation of trunk;Cues for positioning of hands;Cues for positioning of feet   Assist Level Substantial/maximal assistance   Equipment Hand hold assist;Grab bar   Chair/Bed-to-Chair Transfer   Chair/Bed-to-Chair Transfer Stand pivot   Stand Pivot Transfer   Patient Needs Assistance With Extra time to complete activity;Financial planner;Facilitation of hip flexion;Facilitation of trunk;Facilitation of weight shift;Cues for positioning of hands;Cues for sequencing;Balance;Facilitation of hip extension;Cues for positioning of feet   Assist Level Substantial/maximal assistance   Equipment Hand hold assist;Standard wheelchair;Grab bar   Comments bed<>wheelchair<>toilet   Toileting   Patient Needs Assistance With Adjusting clothing before toileting;Wiping self;Adjusting clothing after toileting;Balance   Assist Level Substantial/maximal assistance   Position Standing for pants management;Standing for wiping   Equipment Grab bar   Toilet Transfer   Patient Needs Assistance With Facilitation of trunk;Facilitation of weight shift;Increased time to complete;Balance;Verbal cues;Transferring on;Transferring off   Transfer Type Stand pivot   Assist Level Substantial/maximal assistance   Equipment Grab bar   Daily Care   Patient continent of bowel? No   Device Toilet;Absorbent pad   Stool Occurrence 1   Accident  - Bowel Small   Stool Amount Small   Stool Appearance Bloody;Soft;Pasty   Stool Color Red   Last Bowel Movement Date 11/21/23   Wheelchair Distance 1   Distance 1 15'   Type Ultralight wheelchair   Patient Needs Assistance With Turns;Path navigation;Propulsion   Assist Level Partial/moderate assistance   Propulsion Method Bilateral lower extremity   Therapeutic Interventions   Therapeutic Activities toileting, max assist to change brief and don pants, sitting in wheelchair to brush teeth at sink with mod A   Assessment   Assessment Patient with good participation during session. Continues to require variable assist with transfers and increased time and cueing for safety and hand placement.  Noted to have significant blood in stool, notified RN and physician.   Plan   PT Plan gait training Ax2 (hand hold assist, vector to help with posture vs rifton with Ax1), neurogym for higher reps (consider using mirror for visual feedback), standing/reaching activities to promote upright posture, stairs as appropriate - consider Ax2, nustep for conditioning, outcome measures as appropriate, continued wc ultralight propulsion; standing at plinth activities   Recommendations   PT Discharge Recommendations Consistent assistance;Home setting with home health;Versus;Skilled nursing facility   PT Equipment Recommendations Too early to determine  (would likely benefit from custom wc if dc home, sara steady)         Therapist: Tereasa Free, PT, DPT  Date: 11/21/2023

## 2023-11-21 NOTE — Progress Notes
 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  male admitted to [facility] on 10/28/2023 with the following issues: stroke      Rehabilitation Plan  Tentative discharge date: 11/27/2023  Rehabilitation: Patient will continue with comprehensive therapies at modified therapy schedule including physical therapy, occupational therapy, speech & language pathology, specialized rehab nursing, neuropsychology and physiatry oversight.    Goals:  Patient Will Perform Basic Cares With: Partial/moderate assistance, Ongoing  Patient Will Perform Household Mobility With: Partial/moderate assistance, At wheelchair level  Recommended therapy after discharge: Consistent assistance, Home setting with home health, Versus, Skilled nursing facility  PT recommended equipment: Too early to determine (would likely benefit from custom wc if dc home, sara steady)  OT recommended equipment: Tub transfer bench    Daily Functional Update:  Stand to Sit Assist Level: Partial/moderate assistance (11/21/2023  9:30 AM)  Equipment: Hand hold assist; Bed rail (11/21/2023  9:30 AM)     Stand Pivot Assist Level: Substantial/maximal assistance (11/21/2023  9:30 AM)  Equipment: Hand hold assist; Standard wheelchair; Grab bar (11/21/2023  9:30 AM)     Slideboard No data recorded  No data recorded   Gait Distance: 400 (11/20/2023  1:30 PM)    Assist Level: Dependent (11/20/2023  1:30 PM)    Equipment: Specialty walker with harness ([place]if[place]) (11/20/2023  1:30 PM)     Wheelchair Distance 1: 15' (11/21/2023  9:30 AM)    Assist Level: Partial/moderate assistance (11/21/2023  9:30 AM)     Toileting       Assist Level: Substantial/maximal assistance (11/21/2023  9:30 AM)  Equipment: Grab bar (11/21/2023  9:30 AM)     Toilet Transfer No data recorded   Upper Body Dressing Assist Level: Substantial/maximal assistance (11/20/2023  9:15 AM)     Lower Body Dressing Assist Level: Substantial/maximal assistance (11/20/2023  9:15 AM)       Diet: PO: Ice chips only (11/07/2023  1:30 PM)      No data found.          Current Medical Problems/Risks of Medical Complications/Management    Acute Right Pontomedullary Ischemic Stroke  Proximal  Right Intracranial Vertebral Artery Occlusion  Diffuse Intracranial Atherosclerosis  Bilateral Horizontal Gaze Palsy  History of Forced Left Gaze Deviation  Left Facial Palsy  Impairments: left hemiparesis, expressive aphasia, oropharyngeal dysphagia, right facial weakness, left facial droop with dysarthria, diplopia, concern for attention/cognitive impairments, LUE ataxia, neurogenic bowel, neurogenic bladder  Impaired mobility and ADL, impaired swallow and cognition/communication               > consult PT/OT/SLP and neuropsychology to evaluate and treat  > Agitation prevention: Metoprolol switched to propranolol 10MG  bid.  Working on improved sleep/wake cycle with adjustments to modafinil (increase AM dose to 200mg  at 0530, 100mg  1000 6/10) and Remeron increased to 15mg  6/10.  > Agitation abortive: Trazodone 12.5 mg BID as needed.  If second line is needed, will reach out to psychiatry regarding secondary agent such as Seroquel or increasing trazodone as his Qtc has improved on 6/5 EKG.                   > Sleep optimization: melatonin 5mg  qhs, previously on seroquel nightly               > follow up: Neurology, PM&R, Cardiology     Secondary Stroke Risk Optimization  > Antiplatelet/Anticoagulation: DAPT with aspirin 81mg  daily and clopidogrel 75mg  daily for 90 days followed by aspirin 81mg  indefinitely  > Blood Pressure Goal: <160 during acute hospitalization, long term goal of < 130/80               - Current antihypertensive medications: amlodipine 5mg  daily, lisinopril 20mg  daily, propranolol 10mg  BID (started 6/5)  > Lipids: LDL is 161, with goal of <70: rosuvastatin 40mg  daily (per Cardiology recs)  > Blood Glucose: Hgb A1c is 11.7%, with goal of <7  > Tobacco abstinence goal     Blood in stool  Hematochezia   > GI consult   > Consider transfer to acute hospital for further monitoring and upper and lower scope     Urinary tract infection  Neurogenic Bladder with Retention       -Post-Void (PVR) Bladder Scan (mL): 319 milliliters (11/19/2023  4:09 PM)  -Straight Cath (mL): 415 (11/21/2023  9:00 AM)  - One episode of self-void 5/28; doxazosin increased to 3mg   - s/p bactrim 160mg  bid 5/31-6/1 - urine cx +e. Coli, sensitive to bactrim  -Patient with worsening cloudy urine 6/7, urinalysis obtained 6/8 notable for 3+ leukocytes, Bactrim restarted for a nongated course of 5-7 days.  Culture pending  > Monitor urinary retention per unit protocol. If unable to void or PVRs >300cc perform clean ISC (intermittent straight catheterization) for volumes > . Stop checking BVIs if 3 consecutive volumes <150cc.   > Discuss foley placement vs ISC following treatment of UTI / improvement in mood and participation (pt was noted to be pulling on PEG tube, will not place foley at this time in case he does so with foley catheter)  > Doxazosin 4mg  QHS (last increase 6/3)  > Bactrim started 6/8 - cx e.coli, resistent to bactrim. Ceftriaxone (6/10-6/12)    AKI  Hyperkalemia  - Cr 1.35 from 0.98 6/10  - K- 5.5 6/10 s/p lokelma x1   > monitor daily BMP   > consult IM for assistance with AKI management- appreciate recommendations- note above comment on IV removal and tentative plan for replacement vs working with dietician to possibly increase fluid intake     Suicidal Ideation  Pseudobulbar Affect  Depression  - family  denies knowledge of previous or current SI  - Per Neurology: Consider starting Dextromethorphan-Quinidine in the outpatient setting (unfortunately unavailble on the formulary inpatient) for Pseudobulbar affect, will start Fluoxetine 20mg  in the meantime --> instead was started on Sertraline 50mg , will continue while in rehab  - Per neuropsychology eval on 5/28: Pt?s reported mood was sad. Pt endorsed sadness and passive SI and also made a gesture of shooting himself. He reported that the last time he thought about it was yesterday.    > neuropsychology and psychiatry following   > Neurology clinic follow up on dc  > Appreciate Psychiatry Recommendations-    -- EKG 6/5- Qtc <470.   -- Continue Mirtazapine 7.5mg  QHS for mood, sleep, and appetite- increase to 15mg  QHS 6/10   -- Trazodone 12.5mg  BID PRN for agitation - discuss with psych possibly scheduling trazodone at night for improved sleep   -- Pt denies SI-okay to discontinue CO, transition to one-to-one safety watch in the setting of impulsivity and elopement risk as indicated     Dysphagia s/p PEG tube placement  Moderate Malnutrition  - PEG tube placement 5/15               > Dietician consult, appreciate tube feed recommendations- transition from continuous to bolus feeds 5/19. Appreciate most updated nutrition recommendations, 5/27:  > ADAT to least restrictive per SLP; currently strict NPO with ice chips only when brightly alert   > EN Recs: Bolus Feeds of Jevity 1.5 x5 cartons daily; administered as 1 + 1/4 cartons ( ) QID  Provides 1778kcal, 76g protein, 901mL free fluids  Recommend at minimum water flushes of 30mL before and after each bolus feed for tube patency.   Additional fluids per primary team, with consideration for water boluses of 100mL before and after bolus feeds (provides additional 800mL daily for total fluids of 1781ml/d)  > Per videoswallow study 5/29- silently aspirated thin liquids and penetrated mildly thick liquids, too much oral weakness/uncoordination to consider dysphagia diet safely at this point; continue NPO at this time, continue to encourage ice chips throughout the day under supervision    Left Bell's Palsy  Discharge of the left eye - Improved  Left TMJ pain - Resolved  Ear Wax B/L ear - Improved  - s/p 1 week debrox               > Alternate eye patches; eyepatch for the L eye overnight, artificial tears TID daily in the setting of left facial palsy- increase drops to the left eye in the setting of L eye discharge. Monitor for ssx of infection to the left eye and consider erythromycin ointment if infection suspected   > lidocaine patch and tylenol to be continued as pt reports these improve left sided jaw pain    Oral Thrush - Resolved               > oral care TID   > Nystatin swish and spit started 5/20, ok'd by SLP - d/c'd 6/9    Daytime Sleepiness - Improved  Fatigue - Improved  Increased Somnolence - Resolved  - Concern for increased somnolence 5/20 leading to Neurology Consult- rec CTA H&N; discussed with team may attempt to liberalize BP I.e. decreasing amlodipine to 5mg  due to symptoms possibly 2/2 to hypoperfusion  - CTA Head and Neck - negative for acute change  - Infectious workup including UA, CXR- negative for acute concerns  > appreciate further neurology recommendations- if acute focal weakness,  numbness, coma, etc. Stroke activate/send to ED as he is high risk to have progression of his stroke/basilar involvement. Signed off.   > monitor on modafinil BID - ongoing adjustments as noted above to optimize alertness and participation though seems to be largely psych-related. Trial of modafinil 200mg  in the AM and 100mg  at ~1000 (new time change 6/10).    Bowel Management  At risk for Neurogenic Bowel  Loose stools   Constipation  -Last Bowel Movement Date: 11/21/23 (11/21/2023 10:48 AM)  - Goal for a daily BM or every 24-48 hours.   - c diff negative 5/18  > adjust bowel regimen PRN   > loperamide PRN     Hypertension  Orthostatic Hypotension  LVOT Obstruction  PFO  CAD s/p PCI               > metoprolol 25mg  BID - change to propranolol 10mg  BID 6/5               > amlodipine 5mg  daily, lisinopril 20mg  daily               > 30 day MCOT    > compression stockings to prevent sx orthostatic hypotension               > Follow up with outpatient Cardiology      Diabetes Mellitus               > Endocrinology consult for ongoing discharge regimen planning    Aspiration Pneumonia - Resolved  S/p AHRF  - increased O2 req overnight 5/13 with elevated procal, tachycardia, leukocytosis  - CXR 5/13 notable for mild basal opacities, favored to represent atelectasis  - s/p vanc x1 and zosyn x3 days  - sputum cx NGTD  - Augmentin to complete total 7 day course of antibiotics through 5/20 AM    Nutrition:   Body mass index is 24.58 kg/m?SABRA  Malnutrition Details:  Malnutrition present on admission  ICD-10 code E44: Chronic illness/Moderate non-severe malnutrition      Mild loss of muscle mass, Mild loss of body fat, Energy intake: Less than 75% of estimated energy requirement for 1 month or more        Loss of Subcutaneous Fat: Yes Mild Orbital, Triceps  Muscle Wasting: Yes Moderate Clavicle, Temple, Deltoid               Malnutrition Interventions: Continue to provide EN to meet 100% estimated protein and kcal needs    Wounds:          DVT ppx: enoxaparin (LOVENOX) syringe 40 mg  QDAY(21); patient will not require at the time of discharge    Code Status: Full Code    Current Medications:  Scheduled Meds:acetaminophen (TYLENOL) tablet 650 mg, 650 mg, Feeding Tube, Q6H*  amLODIPine (NORVASC) tablet 5 mg, 5 mg, Per NG tube, QDAY  artificial tears (PF) single dose ophthalmic solution 2 drop, 2 drop, Both Eyes, TID  aspirin chewable tablet 81 mg, 81 mg, Per NG tube, QDAY  cefTRIAXone (ROCEPHIN) IVP 2 g, 2 g, Intravenous, Q24H*  clopiDOGreL (PLAVIX) tablet 75 mg, 75 mg, Per NG tube, QDAY  diclofenac sodium (VOLTAREN) 1 % topical gel 2 g, 2 g, Topical, QID  Diet Enteral Feeding Bolus, , SEE ADMIN INSTRUCTIONS, QID (3,87,83,79)   And  Water Bolus, 30 mL, PEG Tube, QID (3,87,83,79)  doxazosin (CARDURA) tablet 4 mg, 4 mg, Feeding Tube, QHS  enoxaparin (LOVENOX) syringe 40 mg, 40 mg, Subcutaneous,  QDAY(21)  insulin aspart (U-100) (NOVOLOG FLEXPEN U-100 INSULIN) injection PEN 0-12 Units, 0-12 Units, Subcutaneous, QID  insulin aspart (U-100) (NOVOLOG FLEXPEN U-100 INSULIN) injection PEN 17 Units, 17 Units, Subcutaneous, QID  insulin glargine (LANTUS SOLOSTAR U-100 INSULIN) injection PEN 16 Units, 16 Units, Subcutaneous, QDAY  lidocaine (LIDODERM) 5 % topical patch 1 patch, 1 patch, Topical, QDAY  melatonin tablet 5 mg, 5 mg, Feeding Tube, QHS  mirtazapine (REMERON) tablet 15 mg, 15 mg, Oral, QHS  modafiniL (PROVIGIL) tablet 200 mg, 200 mg, Oral, QDAY   And  modafiniL (PROVIGIL) tablet 100 mg, 100 mg, Oral, QDAY  petrolatum (STYE) ophthalmic ointment 0.25 inch, 0.25 inch, Both Eyes, TID  propranoloL (INDERAL) tablet 10 mg, 10 mg, Per NG tube, BID  rosuvastatin (CRESTOR) tablet 40 mg, 40 mg, Feeding Tube, QDAY  senna (SENOKOT) tablet 1 tablet, 1 tablet, Feeding Tube, BID  thiamine mononitrate (vit B1) tablet 100 mg, 100 mg, Per NG tube, QDAY    Continuous Infusions:  PRN and Respiratory Meds:bisacodyL QDAY PRN, dextrose 50% (D50) IV PRN, diclofenac sodium QID PRN, lidocaine hcl PRN, loperamide TID PRN, oxyCODONE Q12H PRN, pancrelipase 20,880 Units/sodium bicarbonate 650 mg ([place] CLOG DESTROYER) PRN (On Call from Rx), phenoL PRN, simethicone Q6H PRN, trazodone(#) BID PRN      Subjective     Overnight, pt with blood in stool. Again episode of hematochezia this AM. Vitals stable, hgb stable. Pt denies lightheadedness. Pt reports intermittent abdominal pain since last evening. Discussed GI consult for GI bleed evaluation. Pt emotional with tears during conversation. Daughter [name] at bedside and updated.     Objective                          Vital Signs: Last Filed                 Vital Signs: 24 Hour Range   BP: 133/50 (06/12 1048)  Temp: 36.4 ?C (97.6 ?F) (06/12 1048)  Pulse: 85 (06/12 1048)  Respirations: 16 PER MINUTE (06/12 1048)  SpO2: 99 % (06/12 1048)  O2 Device: None (Room air) (06/12 1048) BP: (104-133)/(39-50)   Temp:  [36.4 ?C (97.6 ?F)]   Pulse:  [75-85]   Respirations:  [16 PER MINUTE-18 PER MINUTE]   SpO2:  [95 %-99 %]   O2 Device: None (Room air)     Vitals:    11/18/23 0500 11/19/23 0548 11/20/23 0531   Weight: 65.3 kg (143 lb 15.4 oz) 65.8 kg (145 lb 1 oz) 67 kg (147 lb 11.3 oz)       Intake/Output Summary:  (Last 24 hours)    Intake/Output Summary (Last 24 hours) at 11/21/2023 1323  Last data filed at 11/21/2023 0900  Gross per 24 hour   Intake 1246 ml   Output 1215 ml   Net 31 ml        Last Bowel Movement Date: 11/21/23    Labs--reviewed  Results for orders placed or performed during the hospital encounter of 10/28/23 (from the past 24 hours)   POC GLUCOSE    Collection Time: 11/20/23  4:57 PM   # # Low-High    Glucose, POC 167 (H) 70 - 100 mg/dL   POC GLUCOSE    Collection Time: 11/20/23  8:43 PM   # # Low-High    Glucose, POC 170 (H) 70 - 100 mg/dL   POC GLUCOSE    Collection Time: 11/21/23  5:39 AM   # # Sharleen  Glucose, POC 121 (H) 70 - 100 mg/dL   BASIC METABOLIC PANEL CELLULAR THERAPEUTICS    Collection Time: 11/21/23  6:25 AM   # # Low-High    Sodium 135 (L) 137 - 147 mmol/L    Potassium 5.2 (H) 3.5 - 5.1 mmol/L    Chloride 102 98 - 110 mmol/L    Glucose 152 (H) 70 - 100 mg/dL    Blood Urea Nitrogen 45 (H) 7 - 25 mg/dL    Creatinine 8.98 9.59 - 1.24 mg/dL    Calcium 9.1 8.5 - 89.3 mg/dL    CO2 24 21 - 30 mmol/L    Anion Gap 9 3 - 12    Glomerular Filtration Rate (GFR) >60 >60 mL/min   CBC AND DIFF    Collection Time: 11/21/23 11:07 AM   # # Low-High    White Blood Cells 7.20 4.50 - 11.00 10*3/uL    Red Blood Cells 3.50 (L) 4.40 - 5.50 10*6/uL    Hemoglobin 11.0 (L) 13.5 - 16.5 g/dL    Hematocrit 69.5 (L) 40.0 - 50.0 %    MCV 87.0 80.0 - 100.0 fL    MCH 31.5 26.0 - 34.0 pg    MCHC 36.1 (H) 32.0 - 36.0 g/dL    RDW 87.3 88.9 - 84.9 %    Platelet Count 199 150 - 400 10*3/uL    MPV 8.6 7.0 - 11.0 fL    Neutrophils 70.2 41.0 - 77.0 %    Lymphocytes 20.3 (L) 24.0 - 44.0 %    Monocytes 6.5 4.0 - 12.0 %    Eosinophils 2.5 0.0 - 5.0 %    Basophils 0.5 0.0 - 2.0 %    Absolute Neutrophil Count 5.10 1.80 - 7.00 10*3/uL    Absolute Lymph Count 1.50 1.00 - 4.80 10*3/uL    Absolute Monocyte Count 0.50 0.00 - 0.80 10*3/uL    Absolute Eosinophil Count 0.20 0.00 - 0.45 10*3/uL    Absolute Basophil Count 0.00 0.00 - 0.20 10*3/uL   PROTIME INR (PT)    Collection Time: 11/21/23 11:07 AM   # # Low-High    Protime 12.9 9.9 - 14.2 Seconds    INR 1.1 0.9 - 1.2   POC GLUCOSE    Collection Time: 11/21/23 11:29 AM   # # Low-High    Glucose, POC 169 (H) 70 - 100 mg/dL         Physical Exam  General: Tearful, alert elderly male sitting in bedside wheelchair  HEENT: NCAT  Heart: Extremities warm and well perfused  Lungs: Normal work of breathing on room air  Abdomen: +Gtube. No tenderness to palpation.  Extremities: no peripheral edema  Psych: pleasant and interactive this AM  Neuro: Alert, interactive, able to answer simple questions yes or no or okay. Spontaneous movement of bilateral upper extremities noted.  Coordinated movements with left handed fist-bump. Noted ongoing left facial paralyis and bilateral horizontal gaze palsy.    Labs & Therapy Notes Reviewed    Corean CHRISTELLA Brewster, DO   Physical Medicine & Rehabilitation  For patient care questions contact Rehab Medicine 3 FIRST CALL via Voalte.

## 2023-11-22 ENCOUNTER — Encounter: Admit: 2023-11-22 | Discharge: 2023-11-22 | Payer: MEDICARE

## 2023-11-22 ENCOUNTER — Inpatient Hospital Stay: Admit: 2023-11-22 | Discharge: 2023-11-22 | Payer: MEDICARE

## 2023-11-22 DIAGNOSIS — E875 Hyperkalemia: Secondary | ICD-10-CM

## 2023-11-22 DIAGNOSIS — Z1629 Resistance to other single specified antibiotic: Secondary | ICD-10-CM

## 2023-11-22 DIAGNOSIS — I252 Old myocardial infarction: Secondary | ICD-10-CM

## 2023-11-22 DIAGNOSIS — R45851 Suicidal ideations: Secondary | ICD-10-CM

## 2023-11-22 DIAGNOSIS — I69398 Other sequelae of cerebral infarction: Secondary | ICD-10-CM

## 2023-11-22 DIAGNOSIS — Z91148 Patient's other noncompliance with medication regimen for other reason: Secondary | ICD-10-CM

## 2023-11-22 DIAGNOSIS — K5900 Constipation, unspecified: Secondary | ICD-10-CM

## 2023-11-22 DIAGNOSIS — H52203 Unspecified astigmatism, bilateral: Secondary | ICD-10-CM

## 2023-11-22 DIAGNOSIS — I69322 Dysarthria following cerebral infarction: Secondary | ICD-10-CM

## 2023-11-22 DIAGNOSIS — Z801 Family history of malignant neoplasm of trachea, bronchus and lung: Secondary | ICD-10-CM

## 2023-11-22 DIAGNOSIS — B0229 Other postherpetic nervous system involvement: Secondary | ICD-10-CM

## 2023-11-22 DIAGNOSIS — H524 Presbyopia: Secondary | ICD-10-CM

## 2023-11-22 DIAGNOSIS — T378X5A Adverse effect of other specified systemic anti-infectives and antiparasitics, initial encounter: Secondary | ICD-10-CM

## 2023-11-22 DIAGNOSIS — E785 Hyperlipidemia, unspecified: Secondary | ICD-10-CM

## 2023-11-22 DIAGNOSIS — E1142 Type 2 diabetes mellitus with diabetic polyneuropathy: Secondary | ICD-10-CM

## 2023-11-22 DIAGNOSIS — I69391 Dysphagia following cerebral infarction: Secondary | ICD-10-CM

## 2023-11-22 DIAGNOSIS — T368X5A Adverse effect of other systemic antibiotics, initial encounter: Secondary | ICD-10-CM

## 2023-11-22 DIAGNOSIS — Z794 Long term (current) use of insulin: Secondary | ICD-10-CM

## 2023-11-22 DIAGNOSIS — B370 Candidal stomatitis: Secondary | ICD-10-CM

## 2023-11-22 DIAGNOSIS — R451 Restlessness and agitation: Secondary | ICD-10-CM

## 2023-11-22 DIAGNOSIS — I2510 Atherosclerotic heart disease of native coronary artery without angina pectoris: Secondary | ICD-10-CM

## 2023-11-22 DIAGNOSIS — I6931 Attention and concentration deficit following cerebral infarction: Secondary | ICD-10-CM

## 2023-11-22 DIAGNOSIS — Z931 Gastrostomy status: Secondary | ICD-10-CM

## 2023-11-22 DIAGNOSIS — Z6821 Body mass index (BMI) 21.0-21.9, adult: Secondary | ICD-10-CM

## 2023-11-22 DIAGNOSIS — F4323 Adjustment disorder with mixed anxiety and depressed mood: Secondary | ICD-10-CM

## 2023-11-22 DIAGNOSIS — N400 Enlarged prostate without lower urinary tract symptoms: Secondary | ICD-10-CM

## 2023-11-22 DIAGNOSIS — G250 Essential tremor: Secondary | ICD-10-CM

## 2023-11-22 DIAGNOSIS — Z741 Need for assistance with personal care: Secondary | ICD-10-CM

## 2023-11-22 DIAGNOSIS — I69320 Aphasia following cerebral infarction: Secondary | ICD-10-CM

## 2023-11-22 DIAGNOSIS — R1312 Dysphagia, oropharyngeal phase: Secondary | ICD-10-CM

## 2023-11-22 DIAGNOSIS — N390 Urinary tract infection, site not specified: Secondary | ICD-10-CM

## 2023-11-22 DIAGNOSIS — T383X6D Underdosing of insulin and oral hypoglycemic [antidiabetic] drugs, subsequent encounter: Secondary | ICD-10-CM

## 2023-11-22 DIAGNOSIS — G4700 Insomnia, unspecified: Secondary | ICD-10-CM

## 2023-11-22 DIAGNOSIS — R338 Other retention of urine: Secondary | ICD-10-CM

## 2023-11-22 DIAGNOSIS — Z9049 Acquired absence of other specified parts of digestive tract: Secondary | ICD-10-CM

## 2023-11-22 DIAGNOSIS — E440 Moderate protein-calorie malnutrition: Secondary | ICD-10-CM

## 2023-11-22 DIAGNOSIS — E1151 Type 2 diabetes mellitus with diabetic peripheral angiopathy without gangrene: Secondary | ICD-10-CM

## 2023-11-22 DIAGNOSIS — Z833 Family history of diabetes mellitus: Secondary | ICD-10-CM

## 2023-11-22 DIAGNOSIS — Q2112 Patent foramen ovale: Secondary | ICD-10-CM

## 2023-11-22 DIAGNOSIS — I672 Cerebral atherosclerosis: Secondary | ICD-10-CM

## 2023-11-22 DIAGNOSIS — I10 Essential (primary) hypertension: Secondary | ICD-10-CM

## 2023-11-22 DIAGNOSIS — R809 Proteinuria, unspecified: Secondary | ICD-10-CM

## 2023-11-22 DIAGNOSIS — Z87891 Personal history of nicotine dependence: Secondary | ICD-10-CM

## 2023-11-22 DIAGNOSIS — R2681 Unsteadiness on feet: Secondary | ICD-10-CM

## 2023-11-22 DIAGNOSIS — Z8701 Personal history of pneumonia (recurrent): Secondary | ICD-10-CM

## 2023-11-22 DIAGNOSIS — R109 Unspecified abdominal pain: Secondary | ICD-10-CM

## 2023-11-22 DIAGNOSIS — Z8619 Personal history of other infectious and parasitic diseases: Secondary | ICD-10-CM

## 2023-11-22 DIAGNOSIS — H518 Other specified disorders of binocular movement: Secondary | ICD-10-CM

## 2023-11-22 DIAGNOSIS — H510 Palsy (spasm) of conjugate gaze: Secondary | ICD-10-CM

## 2023-11-22 DIAGNOSIS — E871 Hypo-osmolality and hyponatremia: Secondary | ICD-10-CM

## 2023-11-22 DIAGNOSIS — F482 Pseudobulbar affect: Secondary | ICD-10-CM

## 2023-11-22 DIAGNOSIS — R3912 Poor urinary stream: Secondary | ICD-10-CM

## 2023-11-22 DIAGNOSIS — H532 Diplopia: Secondary | ICD-10-CM

## 2023-11-22 DIAGNOSIS — N179 Acute kidney failure, unspecified: Secondary | ICD-10-CM

## 2023-11-22 DIAGNOSIS — I69393 Ataxia following cerebral infarction: Secondary | ICD-10-CM

## 2023-11-22 DIAGNOSIS — N319 Neuromuscular dysfunction of bladder, unspecified: Secondary | ICD-10-CM

## 2023-11-22 DIAGNOSIS — N401 Enlarged prostate with lower urinary tract symptoms: Secondary | ICD-10-CM

## 2023-11-22 DIAGNOSIS — B9620 Unspecified Escherichia coli [E. coli] as the cause of diseases classified elsewhere: Secondary | ICD-10-CM

## 2023-11-22 DIAGNOSIS — H4902 Third [oculomotor] nerve palsy, left eye: Secondary | ICD-10-CM

## 2023-11-22 DIAGNOSIS — Z955 Presence of coronary angioplasty implant and graft: Secondary | ICD-10-CM

## 2023-11-22 DIAGNOSIS — Z9181 History of falling: Secondary | ICD-10-CM

## 2023-11-22 DIAGNOSIS — Z7409 Other reduced mobility: Secondary | ICD-10-CM

## 2023-11-22 DIAGNOSIS — I69392 Facial weakness following cerebral infarction: Secondary | ICD-10-CM

## 2023-11-22 LAB — CBC AND DIFF
~~LOC~~ BKR ABSOLUTE BASO COUNT: 0.1 10*3/uL (ref 0.00–0.20)
~~LOC~~ BKR ABSOLUTE BASO COUNT: 0.1 10*3/uL (ref 0.00–0.20)
~~LOC~~ BKR ABSOLUTE BASO COUNT: 0 10*3/uL (ref 0.00–0.20)
~~LOC~~ BKR ABSOLUTE EOS COUNT: 0.2 10*3/uL (ref 0.00–0.45)
~~LOC~~ BKR ABSOLUTE EOS COUNT: 0.2 10*3/uL (ref 0.00–0.45)
~~LOC~~ BKR ABSOLUTE EOS COUNT: 0.1 10*3/uL (ref 0.00–0.45)
~~LOC~~ BKR ABSOLUTE LYMPH COUNT: 1.9 10*3/uL (ref 1.00–4.80)
~~LOC~~ BKR ABSOLUTE LYMPH COUNT: 1.5 10*3/uL (ref 1.00–4.80)
~~LOC~~ BKR ABSOLUTE MONO COUNT: 0.5 10*3/uL (ref 0.00–0.80)
~~LOC~~ BKR ABSOLUTE MONO COUNT: 0.5 10*3/uL (ref 0.00–0.80)
~~LOC~~ BKR ABSOLUTE NEUTROPHIL: 3.7 10*3/uL (ref 1.80–7.00)
~~LOC~~ BKR ABSOLUTE NEUTROPHIL: 6.2 10*3/uL (ref 1.80–7.00)
~~LOC~~ BKR BASOPHILS %: 0.8 % (ref 0.0–2.0)
~~LOC~~ BKR BASOPHILS %: 0.4 % (ref 0.0–2.0)
~~LOC~~ BKR EOSINOPHILS %: 3.2 % (ref 0.0–5.0)
~~LOC~~ BKR EOSINOPHILS %: 1.5 % (ref 0.0–5.0)
~~LOC~~ BKR HEMATOCRIT: 30 % — ABNORMAL LOW (ref 40.0–50.0)
~~LOC~~ BKR LYMPHOCYTES %: 30 % (ref 24.0–44.0)
~~LOC~~ BKR MCH: 30 pg — ABNORMAL HIGH (ref 26.0–34.0)
~~LOC~~ BKR MCHC: 34 g/dL (ref 32.0–36.0)
~~LOC~~ BKR MCV: 87 fL (ref 80.0–100.0)
~~LOC~~ BKR MONOCYTES %: 8.2 % (ref 4.0–12.0)
~~LOC~~ BKR MONOCYTES %: 5.9 % (ref 4.0–12.0)
~~LOC~~ BKR MPV: 8.5 fL (ref 7.0–11.0)
~~LOC~~ BKR PLATELET COUNT: 174 10*3/uL (ref 150–400)
~~LOC~~ BKR RBC COUNT: 3.4 10*6/uL — ABNORMAL LOW (ref 4.40–5.50)
~~LOC~~ BKR RBC COUNT: 3.2 10*6/uL — ABNORMAL LOW (ref 4.40–5.50)
~~LOC~~ BKR RDW: 13 % (ref 11.0–15.0)
~~LOC~~ BKR WBC COUNT: 6.4 10*3/uL (ref 4.50–11.00)
~~LOC~~ BKR WBC COUNT: 8.4 10*3/uL (ref 4.50–11.00)

## 2023-11-22 LAB — COMPREHENSIVE METABOLIC PANEL
~~LOC~~ BKR ALBUMIN: 3.9 g/dL (ref 3.5–5.0)
~~LOC~~ BKR POTASSIUM: 5.4 mmol/L — ABNORMAL HIGH (ref 3.5–5.1)

## 2023-11-22 LAB — POC GLUCOSE
~~LOC~~ BKR POC GLUCOSE: 95 mg/dL (ref 70–100)
~~LOC~~ BKR POC GLUCOSE: 120 mg/dL — ABNORMAL HIGH (ref 70–100)
~~LOC~~ BKR POC GLUCOSE: 128 mg/dL — ABNORMAL HIGH (ref 70–100)
~~LOC~~ BKR POC GLUCOSE: 129 mg/dL — ABNORMAL HIGH (ref 70–100)
~~LOC~~ BKR POC GLUCOSE: 150 mg/dL — ABNORMAL HIGH (ref 70–100)

## 2023-11-22 LAB — HEMOGLOBIN: ~~LOC~~ BKR HEMOGLOBIN: 10 g/dL — ABNORMAL LOW (ref 21–32)

## 2023-11-22 LAB — EGD REPORT

## 2023-11-22 LAB — COLONOSCOPY

## 2023-11-22 MED ORDER — SENNOSIDES-DOCUSATE SODIUM 8.6-50 MG PO TAB
1 | Freq: Every day | ORAL | 0 refills | Status: DC | PRN
Start: 2023-11-22 — End: 2023-11-27

## 2023-11-22 MED ORDER — MIRTAZAPINE 15 MG PO TAB
15 mg | Freq: Every evening | ORAL | 0 refills | Status: DC
Start: 2023-11-22 — End: 2023-11-27
  Administered 2023-11-22 – 2023-11-27 (×6): 15 mg via ORAL

## 2023-11-22 MED ORDER — TUBE FEED BOLUS (ENAR)
Freq: Four times a day (QID) | 0 refills | Status: DC
Start: 2023-11-22 — End: 2023-11-30

## 2023-11-22 MED ORDER — PROPRANOLOL 10 MG PO TAB
10 mg | Freq: Two times a day (BID) | NASOGASTRIC | 0 refills | Status: DC
Start: 2023-11-22 — End: 2023-11-23
  Administered 2023-11-22 – 2023-11-23 (×4): 10 mg via NASOGASTRIC

## 2023-11-22 MED ORDER — FENTANYL CITRATE (PF) 50 MCG/ML IJ SOLN
50 ug | INTRAVENOUS | 0 refills | Status: CN | PRN
Start: 2023-11-22 — End: ?

## 2023-11-22 MED ORDER — SODIUM ZIRCONIUM CYCLOSILICATE 10 GRAM PO PWPK
10 g | Freq: Once | ORAL | 0 refills | Status: CP
Start: 2023-11-22 — End: ?
  Administered 2023-11-22: 13:00:00 10 g via ORAL

## 2023-11-22 MED ORDER — TRAZODONE(#) 10MG/ML PO SUSP
25 mg | Freq: Two times a day (BID) | GASTROSTOMY | 0 refills | Status: DC | PRN
Start: 2023-11-22 — End: 2023-11-30
  Administered 2023-11-23: 03:00:00 25 mg via GASTROSTOMY

## 2023-11-22 MED ORDER — FENTANYL CITRATE (PF) 50 MCG/ML IJ SOLN
25 ug | INTRAVENOUS | 0 refills | Status: CN | PRN
Start: 2023-11-22 — End: ?

## 2023-11-22 MED ORDER — TRAZODONE(#) 10MG/ML PO SUSP
25 mg | Freq: Every evening | GASTROSTOMY | 0 refills | Status: DC
Start: 2023-11-22 — End: 2023-11-30
  Administered 2023-11-23 – 2023-11-29 (×7): 25 mg via GASTROSTOMY

## 2023-11-22 MED ORDER — PROPOFOL 10 MG/ML IV EMUL 20 ML (INFUSION)(AM)(OR)
INTRAVENOUS | 0 refills | Status: DC
Start: 2023-11-22 — End: 2023-11-22
  Administered 2023-11-22: 19:00:00 120 ug/kg/min via INTRAVENOUS

## 2023-11-22 MED ORDER — DEXTROSE 50 % IN WATER (D50W) IV SYRG
12.5-25 g | INTRAVENOUS | 0 refills | Status: DC | PRN
Start: 2023-11-22 — End: 2023-11-27

## 2023-11-22 MED ORDER — HALOPERIDOL LACTATE 5 MG/ML IJ SOLN
1 mg | Freq: Once | INTRAVENOUS | 0 refills | Status: CN | PRN
Start: 2023-11-22 — End: ?

## 2023-11-22 MED ORDER — HYDROMORPHONE (PF) 2 MG/ML IJ SYRG
.2 mg | INTRAVENOUS | 0 refills | Status: CN | PRN
Start: 2023-11-22 — End: ?

## 2023-11-22 MED ORDER — WHITE PETROLATUM-MINERAL OIL 57.7-31.9 % OP OINT
.25 [in_us] | Freq: Three times a day (TID) | OPHTHALMIC | 0 refills | Status: DC
Start: 2023-11-22 — End: 2023-11-30
  Administered 2023-11-23 – 2023-11-25 (×3): 0.25 [in_us] via OPHTHALMIC

## 2023-11-22 MED ORDER — ASPIRIN 81 MG PO CHEW
81 mg | Freq: Every day | NASOGASTRIC | 0 refills | Status: DC
Start: 2023-11-22 — End: 2023-11-30
  Administered 2023-11-22 – 2023-11-29 (×7): 81 mg via NASOGASTRIC

## 2023-11-22 MED ORDER — PANCRELIPASE-SODIUM BICARBONATE 20,880 K UNIT-650 MG
GASTROSTOMY | 0 refills | Status: DC | PRN
Start: 2023-11-22 — End: 2023-11-30

## 2023-11-22 MED ORDER — THIAMINE MONONITRATE (VIT B1) 100 MG PO TAB
100 mg | Freq: Every day | NASOGASTRIC | 0 refills | Status: DC
Start: 2023-11-22 — End: 2023-11-30
  Administered 2023-11-22 – 2023-11-29 (×7): 100 mg via NASOGASTRIC

## 2023-11-22 MED ORDER — PEG-ELECTROLYTE SOLN 420 GRAM PO SOLR
2 L | ORAL | 0 refills | Status: AC | PRN
Start: 2023-11-22 — End: ?

## 2023-11-22 MED ORDER — MEPERIDINE (PF) 25 MG/ML IJ SYRG
12.5 mg | INTRAVENOUS | 0 refills | Status: CN | PRN
Start: 2023-11-22 — End: ?

## 2023-11-22 MED ORDER — INSULIN ASPART 100 UNIT/ML SC FLEXPEN
0-6 [IU] | Freq: Before meals | SUBCUTANEOUS | 0 refills | Status: DC
Start: 2023-11-22 — End: 2023-11-27
  Administered 2023-11-23: 03:00:00 1 [IU] via SUBCUTANEOUS

## 2023-11-22 MED ORDER — LACTATED RINGERS IV SOLP
INTRAVENOUS | 0 refills | Status: DC
Start: 2023-11-22 — End: 2023-11-22
  Administered 2023-11-22: 19:00:00 1000.0000 mL via INTRAVENOUS

## 2023-11-22 MED ORDER — PHENOL 1.4 % MM SPRA
2 | OROMUCOSAL | 0 refills | Status: DC | PRN
Start: 2023-11-22 — End: 2023-11-30

## 2023-11-22 MED ORDER — AMLODIPINE 5 MG PO TAB
5 mg | Freq: Every day | NASOGASTRIC | 0 refills | Status: DC
Start: 2023-11-22 — End: 2023-11-30
  Administered 2023-11-22 – 2023-11-29 (×3): 5 mg via NASOGASTRIC

## 2023-11-22 MED ORDER — PANTOPRAZOLE 40 MG IV SOLR
40 mg | Freq: Two times a day (BID) | INTRAVENOUS | 0 refills | Status: DC
Start: 2023-11-22 — End: 2023-11-23
  Administered 2023-11-22 – 2023-11-23 (×4): 40 mg via INTRAVENOUS

## 2023-11-22 MED ORDER — MELATONIN 5 MG PO TAB
5 mg | Freq: Every evening | ORAL | 0 refills | Status: DC | PRN
Start: 2023-11-22 — End: 2023-11-27
  Administered 2023-11-23 – 2023-11-25 (×2): 5 mg via ORAL

## 2023-11-22 MED ORDER — PROPOFOL INJ 10 MG/ML IV VIAL
INTRAVENOUS | 0 refills | Status: DC
Start: 2023-11-22 — End: 2023-11-22

## 2023-11-22 MED ORDER — LIDOCAINE (PF) 20 MG/ML (2 %) IJ SOLN
INTRAVENOUS | 0 refills | Status: DC
Start: 2023-11-22 — End: 2023-11-22

## 2023-11-22 MED ORDER — ROSUVASTATIN 20 MG PO TAB
40 mg | Freq: Every day | ORAL | 0 refills | Status: DC
Start: 2023-11-22 — End: 2023-11-27
  Administered 2023-11-22 – 2023-11-27 (×5): 40 mg via ORAL

## 2023-11-22 MED ORDER — CEFTRIAXONE INJ 1GM IVP
1 g | INTRAVENOUS | 0 refills | Status: DC
Start: 2023-11-22 — End: 2023-11-23
  Administered 2023-11-22 – 2023-11-23 (×2): 1 g via INTRAVENOUS

## 2023-11-22 MED ORDER — INSULIN GLARGINE 100 UNIT/ML (3 ML) SC INJ PEN
17 [IU] | Freq: Every evening | SUBCUTANEOUS | 0 refills | Status: DC
Start: 2023-11-22 — End: 2023-11-29
  Administered 2023-11-23: 03:00:00 17 [IU] via SUBCUTANEOUS

## 2023-11-22 MED ORDER — PEG-ELECTROLYTE SOLN 420 GRAM PO SOLR
2 L | Freq: Two times a day (BID) | ORAL | 0 refills | Status: CP
Start: 2023-11-22 — End: ?
  Administered 2023-11-22: 07:00:00 2 L via ORAL

## 2023-11-22 MED ORDER — WATER BOLUS (ENAR)
100 mL | GASTROSTOMY | 0 refills | Status: DC
Start: 2023-11-22 — End: 2023-11-30

## 2023-11-22 MED ORDER — OXYCODONE 5 MG PO TAB
5 mg | ORAL | 0 refills | Status: CN | PRN
Start: 2023-11-22 — End: ?

## 2023-11-22 MED ORDER — TRAZODONE(#) 10MG/ML PO SUSP
12.5 mg | Freq: Two times a day (BID) | ORAL | 0 refills | Status: DC | PRN
Start: 2023-11-22 — End: 2023-11-22
  Administered 2023-11-22: 10:00:00 12.5 mg via ORAL

## 2023-11-22 MED ORDER — ACETAMINOPHEN 325 MG PO TAB
650 mg | ORAL | 0 refills | Status: DC | PRN
Start: 2023-11-22 — End: 2023-11-22

## 2023-11-22 MED ORDER — ACETAMINOPHEN 160 MG/5 ML PO SOLN
650 mg | GASTROSTOMY | 0 refills | Status: DC | PRN
Start: 2023-11-22 — End: 2023-11-30
  Administered 2023-11-22 – 2023-11-29 (×9): 650 mg via GASTROSTOMY

## 2023-11-22 MED ORDER — PHENYLEPHRINE HCL IN 0.9% NACL 1 MG/10 ML (100 MCG/ML) IV SYRG
INTRAVENOUS | 0 refills | Status: DC
Start: 2023-11-22 — End: 2023-11-22

## 2023-11-22 NOTE — Progress Notes
 OCCUPATIONAL THERAPY  ASSESSMENT NOTE      Name: Kevin Obrien   MRN: [id number]     DOB: [birth year]      Age: 73 y.o.  Admission Date: 11/21/2023     LOS: 1 day     Date of Service: 11/22/2023      Mobility  Patient Turn/Position: Self  Mobility Level [facility] Highest Level of Mobility (JH-HLM): Stand (1 or more minutes)  Distance Walked (feet): 5 ft  Level of Assistance: Assist X2  Assistive Device: Hand Held  Activity Limited By: Mental Status Variability;Weakness    Subjective  Reason for Admission and Past Medical Hx: Kevin Obrien is a 73 y.o. male and has a past medical history of Accidental fall, Angina pectoris, Arthritis, BPH (benign prostatic hyperplasia), Cataract, Constipation, Coronary artery disease, DM (diabetes mellitus) (CMS-HCC), ED (erectile dysfunction), Heart attack (CMS-HCC), Hyperlipidemia, Hypertension, Joint pain, Neuropathy, and Shingles. The patient presented to [facility] on 10/17/2023 with left gaze deviation, facial weakness and dysarthria. CT head was negative for acute changes. CTA head and neck without LVO. Patient was not a TNK or EVT candidate. 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 Neurology recommend DAPT with aspirin  and Plavix . He is currently on permissive hypertension. The patient's hospital course is functionally complicated by impaired mobility and ADLs.  Significant Hospital Events: 73 y.o. male with past medical history of BPH, diabetes mellitus, hypertension, hyperlipidemia, coronary artery disease status post PCI, Acute right pontomedullary infarct admitted to rehab, transferred now for hematochezia.  Mental / Cognitive: Alert;Oriented;To person;Cooperative;Inconsistent with command following;Confused (impulsive)  Pain: Complains of pain;Unable to rate pain  Pain level: Before activity;During activity;After activity  Pain Location: Abdomen  Persons Present: Occupational Dispensing optician (Provider at end of session)  Comments: Pt was supine in bed at beginning and end of OT evaluation with all needs within reach and precautions in place, RN notified and present at end of session. Pt tearful at end of session; does not state why, but expresses speech off task asking who is in the kitchen, etc. Pt was cooperative throughout.    Home Living Situation   Lives With: Family  Type of Home: House  Entry Stairs: 2  In-Home Stairs: Able to live on main level  Bathroom Setup: Tub/shower unit  Patient Owned Equipment: Cane: Single point  Comments: Pt denies prior use of DME.  (From chart review)    Prior Level of Function  Level Of Independence: Independent with ADL and household mobility with device  Comments: intermittent use of SPC per daughter  History of Falls in Past 3 Months: Yes  Occupation/Education: High school level education;Employed  (From chart review)    Precautions  Precautions:  (PEG tube)    Vision  Corrective Lenses: Wears glasses for reading  Comment: L eye with difficulty tracking especially across midline    ADL's  Where Assessed: Edge of Bed;Supine, Bed  LE Dressing Assist: Total Assist  LE Dressing Deficits: Don/Doff R Sock;Don/Doff L Sock  Toileting Assist: Total Assist  Toileting Deficits:  (Flexiseal and foley catherer present)  Comment: Flexiseal leaking slightly, total A for peri hygiene.    ADL Mobility  Bed Mobility: Supine to Sit: Minimal assist;Verbal cues  Bed Mobility: Sit to Supine: Minimal assist;x2 people;Verbal cues  Bed Mobility Comments: bed rails, HOB adjusted  Transfer Type: Sit to/from stand  Transfer: Assistance Level: To/from;Bed;Minimal assist;x2 people (x10 sit to stands)  Transfer: Assistive Device: Hand hold assist  End of Activity Status:  In bed;Instructed patient to request assist with mobility;Instructed patient to use call light;Nursing notified  Transfer Comments: Pt with trunk flexed forward throughout despite verbal and tactile cues. Pt also with impulsivity and reaching for drawers as if looking for something - when asked what he is looking for, pt unsure. Speech off task throughout.  Sitting Balance: Static sitting balance;Minimal assist;Dynamic sitting balance;x2 people  Standing Balance: Minimal assist;x2 people;Static standing balance;Dynamic standing balance  Gait Distance: 5 feet  Gait: Assistance Level: Moderate assist;x2 people;Management of lines;Safety considerations  Gait: Assistive Device: Hand hold assist  Gait Comments: assist for lines and balance    Activity Tolerance  Endurance: 3/5 Tolerates 25-30 Minutes Exercise w/Multiple Rests    Cognition  Overall Cognitive Status: Confused  Comprehension: Language Barrier  Expression: Expressive Aphasia  Social Interaction: Interacts in a Spontaneous,Cooperative Manner  Problem Solving: Cueing to Sequence Task;Direction Following Assist;Unable to [employer] Light;Decreased Judgment/Safety  Memory: Unable to Provide Accurate Prior Level of Functioning History  Orientation: To Person;Alert & Oriented x1  Attention: Awake/Alert  Cognition Comment: Pt confused and asking about who is in the kitchen, and speech off task throughout.    ROM  R UE ROM: WFL   R UE ROM Method: Active  L UE ROM: Not WFL   L UE ROM Method: Active  ROM Comments: not formally assessed - appears to have decreased B shoulder ROM during session when reaching for assistance to stand/ambulate.Will continue to assess    Sensory  Overall Sensory: Pt Perceives Pressure in Both UEs in Gross Exam    Strength / Tone  Strength Position Assessed: Seated  R UE Strength: WFL  L UE Strength: WFL    Education  Persons Educated: Patient  Barriers To Learning: Cognitive Deficits;Impaired Communication  Interventions: Repetition of Instructions  Teaching Methods: Verbal Instruction;Demonstration  Patient Response: Verbalized and Demo Understanding;More Instruction Required  Topics: Role of OT, Goals for Therapy  Goal Formulation: With Patient    Assessment  Assessment: Decreased ADL Status;Decreased UE ROM;Decreased UE Strength;Decreased Safe/Judg during ADL;Decreased Cognition;Decreased Endurance;Decreased Self-Care Trans;Decreased High-Level ADLs  Prognosis: Fair;w/Cont OT s/p Acute Discharge  Goal Formulation: Patient  Comments: Pt presents with weakness and impulsivity, decreased communication and mobility, and decreased ability to care for himself. Pt would greatly benefit from skilled OT services to maximize safety and independence in daily tasks.    AM-PAC 6 Clicks Daily Activity Inpatient  Putting on and taking off regular lower body clothes: Total  Bathing (Including washing, rinsing, drying): Total  Toileting, which includes using toilet, bedpan, or urinal: Total  Putting on and taking off regular upper body clothing: A Little  Taking care of personal grooming such as brushing teeth: A Little  Eating meals: Total  Daily Activity Raw Score: 10  Standardized (T-scale) Score: 27.31    Plan  OT Frequency: 5x/week  OT Plan for Next Visit: EOB activities, move to recliner, repeated STS    ADL Goals  Patient Will Perform All ADL's: w/ Stand By Assist    Functional Transfer Goals  Pt Will Perform All Functional Transfers: w/ Stand By Assist    OT Discharge Recommendations  Recommendation: Inpatient setting;Recommend rehab medicine consult  Patient Currently Requires Physical Assist With: All mobility;All personal care ADLs;All home functioning ADLs  Patient Currently Requires Supervision For: Making decisions about safety;Communication;ADLs;Mobility    Therapist: Aleck Lam, OTR/L [id number]  Date: 11/22/2023

## 2023-11-22 NOTE — Anesthesia Post-Procedure Evaluation
 Post-Anesthesia Evaluation    Name: Kevin Obrien      MRN: [id number]     DOB: [birth year]     Age: 73 y.o.     Sex: male   __________________________________________________________________________     Procedure Information       Anesthesia Start Date/Time: 11/22/23 1417    Procedures:       ESOPHAGOGASTRODUODENOSCOPY WITH BIOPSY - FLEXIBLE      COLONOSCOPY WITH BIOPSY - FLEXIBLE    Location: ENDO 3 / ENDO/GI    Surgeons: Bonnetta Camellia BROCKS, MD            Post-Anesthesia Vitals  BP: 130/60 (06/13 1530)  Pulse: 80 (06/13 1530)  Respirations: 10 PER MINUTE (06/13 1530)  SpO2: 99 % (06/13 1530)  O2 Device: None (Room air) (06/13 1530)   Vitals Value Taken Time   BP 130/60 11/22/23 15:30   Temp 36.6 ?C (97.8 ?F) 11/22/23 15:10   Pulse 80 11/22/23 15:30   Respirations 10 PER MINUTE 11/22/23 15:30   SpO2 99 % 11/22/23 15:30   O2 Device None (Room air) 11/22/23 15:30   ABP     ART BP           Post Anesthesia Evaluation Note    Evaluation location: Pre/Post  Patient participation: recovered; patient participated in evaluation  Level of consciousness: alert  Pain management: adequate    Hydration: normovolemia  Temperature: 36.0?C - 38.4?C  Airway patency: adequate    Perioperative Events       Post-op nausea and vomiting: no PONV    Postoperative Status  Cardiovascular status: hemodynamically stable  Respiratory status: spontaneous ventilation  Follow-up needed: none  Additional comments: All questions answered.        Perioperative Events  There were no known complications for this encounter.

## 2023-11-22 NOTE — Progress Notes
 General Progress Note    Name:  Kevin Obrien   Today's Date:  11/22/2023  Admission Date: 11/21/2023  LOS: 1 day                     Assessment/Plan:    Principal Problem:    Hematochezia  Active Problems:    Adjustment disorder with mixed anxiety and depressed mood    This is a  73 y.o. male with past medical history of BPH, diabetes mellitus, hypertension, hyperlipidemia, coronary artery disease status post PCI, Acute right pontomedullary infarct admitted to rehab, transferred now for hematochezia.      Hematochezia  GIB  - Has bright red blood in stools   - HB on 6/12 AM: 11  - Iron studies 11/18/2023: Iron 104, % saturation 26, TIBC 398, ferritin 208, transferrin 267  - Currently on aspirin  and Plavix  for recent stroke  Plan:  - Gastroenterology following.  Plan for EGD and colonoscopy on 11/22/23  - Hold Plavix  for now  - Protonix  40 mg twice daily  - CBC every 8 hours  - Blood transfusion consent obtained  - Keep NPO     UTI  S/p Foley cath  - Urine culture from 11/17/2023: >100,000 E coli resistant to Bactrim   - Currently on ceftriaxone   Plan:  - Continue Ceftriaxone      Hypertension  - Continue PTA amlodipine  5 mg, propranolol  10 mg twice daily     CAD status post PCI  - Continue PTA aspirin      Recent right pontomedullary CVA  Expressive aphasia  - Hold PTA Plavix  as above  - Continue PTA aspirin , atorvastatin   - PT/OT   -Psychiatric consultation for some agitation   Recommendations:  Continue mirtazapine  15 mg QHS for mood, sleep, and appetite   Change trazodone  25 mg BID PRN for agitation  Will schedule trazodone  25 mg QHS for insomnia  Patient denies SI and does not have a plan or intention of harming himself or ending his own life. Continue telesitter per nursing discretion in the setting of impulsivity and elopement risk.   Neurorehabilitation Psychology consulted. Agree with and appreciate recommendations.  Psychiatry will continue to follow    Type 2 diabetes  - Continue PTA Lantus  17 units  - LDCF     Hyperlipidemia  - Continue PTA rosuvastatin  40 mg           Diet: NPO  DVT PPx: Holding in setting of GI bleed  Code Status: Full code  Disposition : TBD    Nutrition: Dietitian Documentation             Loss of Subcutaneous Fat: Yes Moderate Orbital, Triceps  Muscle Wasting: Yes Moderate Clavicle, Temple            Wound: Wound Documentation             ________________________________________________________________________    Subjective  Kevin Obrien is a 73 y.o. male.  Patient is only oriented to self and date of birth and the year.  Poor insight and unable to get further information.        Medications  Scheduled Meds:[Transfer Hold] amLODIPine  (NORVASC ) tablet 5 mg, 5 mg, Per NG tube, QDAY  [Transfer Hold] aspirin  chewable tablet 81 mg, 81 mg, Per NG tube, QDAY  cefTRIAXone  (ROCEPHIN ) IVP 1 g, 1 g, Intravenous, Q24H*  [Transfer Hold] insulin  aspart (U-100) (NOVOLOG  FLEXPEN U-100 INSULIN ) injection PEN 0-6 Units, 0-6 Units, Subcutaneous, ACHS (22)  [Transfer Hold]  insulin  glargine (LANTUS  SOLOSTAR U-100 INSULIN ) injection PEN 17 Units, 17 Units, Subcutaneous, QHS(22)  [Transfer Hold] mirtazapine  (REMERON ) tablet 15 mg, 15 mg, Oral, QHS  [Transfer Hold] pantoprazole  (PROTONIX ) injection 40 mg, 40 mg, Intravenous, API(88-78)  [Transfer Hold] petrolatum  (STYE) ophthalmic ointment 0.25 inch, 0.25 inch, Both Eyes, TID  propranoloL  (INDERAL ) tablet 10 mg, 10 mg, Per NG tube, BID  [Transfer Hold] rosuvastatin  (CRESTOR ) tablet 40 mg, 40 mg, Oral, QDAY  [Transfer Hold] thiamine  mononitrate (vit B1) tablet 100 mg, 100 mg, Per NG tube, QDAY  trazodone (#) (DESYREL ) suspension 25 mg, 25 mg, Oral, QHS    Continuous Infusions:   lactated ringers  infusion       PRN and Respiratory Meds:[Transfer Hold] acetaminophen  Q4H PRN, dextrose  50% (D50) IV PRN, [Transfer Hold] melatonin QHS PRN, [Transfer Hold] peg-electrolyte solution PRN (On Call from Rx), [Transfer Hold] phenoL PRN, [Transfer Hold] sennosides-docusate sodium  QDAY PRN, trazodone (#) BID PRN        Objective:                          Vital Signs: Last Filed                 Vital Signs: 24 Hour Range   BP: 132/68 (06/13 1113)  Temp: 36.6 ?C (97.9 ?F) (06/13 1318)  Pulse: 102 (06/13 1113)  Respirations: 18 PER MINUTE (06/13 1113)  SpO2: 97 % (06/13 1113)  O2 Device: None (Room air) (06/13 1318)  Height: 165.1 cm (5' 5) (06/13 1318) BP: (115-168)/(48-68)   Temp:  [36.4 ?C (97.5 ?F)-36.9 ?C (98.4 ?F)]   Pulse:  [66-102]   Respirations:  [18 PER MINUTE]   SpO2:  [93 %-98 %]   O2 Device: None (Room air)   PAINAD Total Score: 1 (11/22/23 0856) Vitals:    11/21/23 2323 11/22/23 1318   Weight: 59.3 kg (130 lb 11.7 oz) 59 kg (130 lb)       Intake/Output Summary:  (Last 24 hours)    Intake/Output Summary (Last 24 hours) at 11/22/2023 1352  Last data filed at 11/22/2023 0900  Gross per 24 hour   Intake 180 ml   Output 3125 ml   Net -2945 ml           Physical Exam    General Appearance: Weak and ill-appearing and cachectic  Head: NC/AT  Lungs: No respiratory distress on room air  Heart: RRR,  Abdomen: Rectal tube having frank blood in it, Foley catheter in place with clear urine  Neurologic: Alert oriented only to self and year of the birth date      Lab Review  Pertinent labs reviewed    Point of Care Testing  (Last 24 hours)  Glucose: 96 (11/22/23 0154)  POC Glucose (Download): (!) 150 (Notified DR) (11/22/23 1313)    Radiology and other Diagnostics Review:    Pertinent radiology reviewed.    Olson Lucarelli, MD   Pager

## 2023-11-22 NOTE — Progress Notes
 CLINICAL NUTRITION                                                        Clinical Nutrition Initial Assessment    Name: Kevin Obrien   MRN: [id number]     DOB: 1951-05-05      Age: 73 y.o.  Admission Date: 11/21/2023     LOS: 1 day     Date of Service: 11/22/2023        Recommendation:  Resume bolus TF of 1.25 cartons Jevity 1.5 QID to total 5 cartons/day. Provide 100mL water  before and after each bolus feeding for hydration. This provides 1775kcal, 76g protein and total water  per day.    Comments:  73 year old male with history of BPH, diabetes mellitus, hypertension, hyperlipidemia, coronary artery disease status post PCI, Acute right pontomedullary infarct admitted to rehab, transferred now for hematochezia. Pt with PEG tube for feeding and is NPO per SLP recs. Information about recent TF orders obtained from rehab dietitian notes. Pt is pleasant but confused on this date.    Nutrition Assessment of Patient:  Admit Weight: 59.3 kg (bed scale);  ;    BMI (Calculated): 21.63;    Pertinent Allergies/Intolerances: none noted   Oral Diet Order: NPO;    Current EN Order: none ordered yet  Current Energy Intake: NPO  Estimated Calorie Needs: 1780kcal (30/kg admit wt)  Estimated Protein Needs: 71-83g (1.2-1.4/kg admit wt)    Malnutrition Assessment:   Malnutrition present on admission  ICD-10 code E43: Acute illness/Severe malnutrition    Weight loss: Greater than 5% x 1 month, Moderate loss of muscle mass, Moderate loss of body fat          Malnutrition Interventions: TF recs provided    Nutrition Focused Physical Exam:  Loss of Subcutaneous Fat: Yes; Severity: Moderate; Location: Orbital, Triceps  Muscle Wasting: Yes; Severity: Moderate; Location: Clavicle, Temple    Physical Assessment:        Pressure Injury: none noted          Nutrition Diagnosis:  Swallowing difficulty  Etiology: medical status  Signs & Symptoms: NPO with TF needs                      Intervention / Plan:  Monitor TF intake adequacy and tolerance, wt status, pertinent labs, skin integrity            Izetta Apley, MS, RD, LD, CNSC     Available on Voalte

## 2023-11-22 NOTE — Progress Notes
 Patient transferred to [facility] due to need for telemetry

## 2023-11-22 NOTE — Progress Notes
 NAME:Kevin Obrien                        MRN: [id number]                 DOB:28-Jan-1951          AGE: 73 y.o.  ADMISSION DATE: 11/21/2023             DAYS ADMITTED: LOS: 1 day       1. Dangerous outburst or aggression towards self or others during this shift: No   Brief description: N/A    2. Active suicidal and /or homicidal ideation during this shift: No    3. Has the patient been in restraints during this shift: Yes    4. Therapeutic Interventions Utilized: Ambulation   Brief description: Pt worked with OT.    5. Visitor Interaction: Both of his daughters.    6. Discharge Updates: EGD and colonoscopy done today.    7. Additional comments: Restraints discontinued at 1739 to do a trial. Patient has been pleasant throughout the shift.

## 2023-11-22 NOTE — Care Coordination-Inpatient
 Patient has been assigned to Med Private N  and will start taking calls after 8 AM. Before 8 AM, please page Med Private Night 5 [phone] with questions.     Iona Beard, MD  Internal medicine physician

## 2023-11-22 NOTE — Anesthesia Pre-Procedure Evaluation
 Anesthesia Pre-Procedure Evaluation    Name: Kevin Obrien      MRN: [id number]     DOB: 06-Apr-1951     Age: 73 y.o.     Sex: male   _________________________________________________________________________     Procedure Info:   Procedure Information       Date/Time: 11/22/23 1435    Procedures:       ESOPHAGOGASTRODUODENOSCOPY WITH BIOPSY - FLEXIBLE      COLONOSCOPY WITH BIOPSY - FLEXIBLE    Location: ENDO 4 / ENDO/GI    Surgeons: Ronalee Pacific, MD            Physical Assessment  Vital Signs (last filed in past 24 hours):  BP: 157/61 (06/13 0833)  Temp: 36.4 ?C (97.6 ?F) (06/13 9166)  Pulse: 84 (06/13 0833)  Respirations: 18 PER MINUTE (06/13 0833)  SpO2: 98 % (06/13 0833)  O2 Device: None (Room air) (06/13 0833)  Height: 165.1 cm (5' 5) (06/12 2323)  Weight: 59.3 kg (130 lb 11.7 oz) (06/12 2323)     Glucose, POC   Date/Time Value Ref Range Status   11/22/2023 0833 129 (H) 70 - 100 mg/dL Final   93/94/7977 7993 158 (H) 70 - 100 MG/DL Final      Patient History   Allergies   Allergen Reactions    Seasonal Allergies RHINITIS        Current Medications    Medication Directions   acetaminophen (TYLENOL) 325 mg tablet Take two tablets via feeding tube every 6 hours as needed for Pain. Indications: pain   amLODIPine (NORVASC) 5 mg tablet one tablet by Per NG tube route daily. Indications: high blood pressure   artificial tears (PF) single dose ophthalmic solution Apply two drops to both eyes three times daily. Indications: dry eye   aspirin 81 mg chewable tablet one tablet by Per NG tube route daily. Indications: stroke prevention   clopiDOGreL (PLAVIX) 75 mg tablet one tablet by Per NG tube route daily. Indications: prevention for a blood clot going to the brain   dextrose 50% (D50) syringe Administer 25-50 mL through vein as Needed (=< 70 mg/dL: See admin instructions). Indications: hypoglycemia   diclofenac sodium (VOLTAREN) 1 % topical gel Apply two g topically to affected area four times daily. Indications: pain doxazosin (CARDURA) 4 mg tablet Take one tablet via feeding tube at bedtime daily. Indications: enlarged prostate with urination problem   enoxaparin (LOVENOX) 40 mg injection syringe Inject 0.4 mL under the skin daily. Indications: deep vein thrombosis prevention   insulin aspart (U-100) (NOVOLOG FLEXPEN U-100 INSULIN) 100 unit/mL (3 mL) PEN Inject zero Units to twelve Units under the skin four times daily. Correction Factor if: BS 181-220=2 units, BS 221-260=4 units, BS 261-300=6 units, BS 301-350=8 units, BS 351-400=10 units, BS >400=12 units  Indications: type 2 diabetes mellitus   insulin aspart (U-100) (NOVOLOG FLEXPEN U-100 INSULIN) 100 unit/mL (3 mL) PEN Inject seventeen Units under the skin four times daily. Indications: type 2 diabetes mellitus   insulin glargine (LANTUS SOLOSTAR U-100 INSULIN) 100 unit/mL (3 mL) subcutaneous PEN Inject sixteen Units under the skin daily. Indications: type 2 diabetes mellitus   lidocaine (LIDODERM) 5 % topical patch Apply one patch topically to affected area daily. Apply patch for 12 hours, then remove for 12 hours before repeating.  Indications: neuropathic pain   lidocaine hcl (XYLOCAINE) 2 % mucosal jelly Apply  topically to affected area as Needed. Indications: local anesthesia for urethral pain   melatonin 5 mg  tablet Take one tablet via feeding tube at bedtime daily. Indications: difficulty sleeping   mirtazapine (REMERON) 15 mg tablet Take one tablet by mouth at bedtime daily. Indications: major depressive disorder   modafiniL (PROVIGIL) 100 mg tablet Take two tablets by mouth daily AND one tablet daily. Indications: post-stroke sleepiness   oxyCODONE (ROXICODONE) 5 mg tablet Take one-half tablet by mouth every 12 hours as needed. Indications: pain   peg-electrolyte solution (NULYTELY) 420 gram oral solution Mix as directed on package. Drink (8oz) every 10 minutes until gone. Refrigerate once mixed.  Indications: emptying of the bowel   peg-electrolyte solution (NULYTELY) 420 gram oral solution Mix as directed on package. Drink (8oz) every 10 minutes until gone. Refrigerate once mixed.  Indications: emptying of the bowel   petrolatum (STYE) 57.7-31.9 % oint ophthalmic ointment Apply one-quarter inch to both eyes three times daily. Indications: dry eye   phenoL (CHLORASEPTIC) 1.4 % mouth spray Take two sprays by mouth or throat as directed as Needed. Indications: irritation of the mouth   propranoloL (INDERAL) 10 mg tablet one tablet by Per NG tube route twice daily. Indications: high blood pressure   rosuvastatin (CRESTOR) 40 mg tablet Take one tablet via feeding tube daily. Indications: excessive fat in the blood   senna (SENOKOT) 8.6 mg tablet Take one tablet via feeding tube twice daily. Indications: constipation   thiamine mononitrate (vit B1) 100 mg tablet one tablet by Per NG tube route daily. Indications: deficiency in thiamine or vitamin B1   trazodone(#) (DESYREL) 10 mg/mL Take 1.25 mL by mouth twice daily as needed. Indications: PRN for agitation       Review of Systems/Medical History        PONV Screening: Non-smoker        Pulmonary           Cardiovascular       Recent diagnostic studies:          echocardiogram          ? Normal sized left ventricle. Normal overall LV wall thickness.  However moderate basal to mid septal hypertrophy present with maximal septal thickness measured at 1.36 cm  ? Hyperdynamic left ventricular systolic function with estimated ejection fraction of 70% by biplane Simpson's method. No regional wall motion abnormalities.   ? Systolic anterior motion of the mitral valve with dynamic LVOT obstruction present with peak gradient of 27 mmHg at rest.  Patient unable to reliably perform Valsalva.  ? Grade 1 diastolic dysfunction with normal estimated left atrial pressure.  ? Normal sized right ventricle. Normal right ventricular systolic function.  ? Normal sized left atrium. Normal sized right atrium..   ? Nonspecific thickening and mild calcification of the anterior mitral leaflet.  Focal thickening and calcification of the aortic valve.  ? PA systolic pressure could not be reliably estimated due to inadequate TR jet.   ? No pericardial effusion.         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          Cardiovascular Findings:       Rhythm: regular      Rate: normal    Pulmonary Findings:       Breath sounds clear to auscultation.    Abdominal Findings:       Not obese      Abdomen soft      Comments: G tube    Neurological Findings:         Altered mental status      Motor deficit      Comments: A&O x2, good strength in extremities, right facial droop    Constitutional findings:       No acute distress       Previous Airway Procedure Notes Displaying the 3 most recent records   No records found.         Patient Lines/Drains/Airways Status       Active Lines:       Name Placement date Placement time Site Days    Long Term GI Tube Gastrostomy/PEG Abdomen, left upper 18 FR 10/24/23  1040  -- 29    Indwelling Urinary Catheter 16 FR Standard 2-way 11/22/23  0400  -- less than 1    Fecal Containment Device  11/22/23  0400  -- less than 1    Peripheral IV 11/20/23 1200 Right Posterior Forearm 20 G 11/20/23  1200  -- 2                  Diagnostic Tests  Hematology:   Lab Results   Component Value Date    HGB 10.9 (L) 11/22/2023    HCT 31.5 (L) 11/22/2023    PLTCT 188 11/22/2023    WBC 11.10 (H) 11/22/2023    NEUT 80.1 (H) 11/22/2023    ANC 8.90 (H) 11/22/2023    ALC 1.40 11/22/2023    MONA 4.4 11/22/2023    AMC 0.50 11/22/2023    EOSA 2.0 11/22/2023    ABC 0.10 11/22/2023    MCV 86.6 11/22/2023    MCH 29.9 11/22/2023    MCHC 34.6 11/22/2023    MPV 8.2 11/22/2023    RDW 12.9 11/22/2023         General Chemistry:   Lab Results   Component Value Date    NA 139 11/22/2023    K 5.4 (H) 11/22/2023    CL 104 11/22/2023    CO2 24 11/22/2023    GAP 11 11/22/2023    BUN 31 (H) 11/22/2023    CR 0.75 11/22/2023    GLU 96 11/22/2023    CA 9.4 11/22/2023    ALBUMIN 3.9 11/22/2023    LACTIC 0.9 10/24/2023  MG 1.8 10/21/2023    TOTBILI 0.3 11/22/2023      Coagulation:   Lab Results   Component Value Date    PT 12.9 11/21/2023    PTT 27.6 10/17/2023    INR 1.1 11/21/2023       PAC Plan    Anesthesia Plan    ASA score: 4   Plan: MAC  NPO status: acceptable      Informed Consent  Anesthetic plan and risks discussed with patient.  Use of blood products discussed with patient            Alerts

## 2023-11-22 NOTE — Progress Notes
 EGD/Colonoscopy  Post Upper Endoscopy/Colonoscopy Instructions      -You may have a sore throat after the procedure for 2-3 days.  Try sucrets or lozenges to help ease the pain.  If it continues please contact us .    -If you feel feverish, have a temperature of 101 degrees or higher, persistent nausea and vomiting, abdominal pain or dark stools; please notify your nurse or GI physician.    -You may have abdominal cramping following the procedure this can be relieved by belching or passing air.    -If you have redness or swelling at the IV site, place a warm, wet washcloth over the affected areas for 15 minutes, 3-4 times a day until the redness subsides.  If symptoms continue for 2-3 days, contact your regular physician.    - If you have bleeding from your mouth or rectum, over 2 tablespoons and increasing, please notify your physician.  A small amount of bleeding is normal if a biopsy or polyps were taken.  If you are vomiting blood you need to seek immediate medical attention.    - You may resume all your routine medications, if medications need to be held your physician and/or nurse will notify you post procedure.    -Please note: As of April 5th, 2021, the Cures Act requires that all healthcare providers give patients immediate access to their health information. This information includes EHR (electronic health records), consultation notes, discharge summaries, images, lab reports, pathology reports, procedure notes, progress notes, and paper charts. This means you may receive test results in your MyChart before your provider has reviewed them with you. Your care team will follow up with you via MyChart or telephone after reviewing the tests to interpret your results for you.      SPECIFIC INSTRUCTIONS  INPATIENTS:  Ask for help when you get up in your room, as you may still be drowsy from your sedation.    OUTPATIENTS:  Because of sedation and lack of coordination, UNTIL TOMORROW, DO NOT:  Operate any motorized vehicle - this includes driving.  Sign any legal documents or conduct important business matters.  Use any dangerous machinery (chain saw, lawnmower, etc.).  Drink any alcoholic beverages.    **Should you have any questions or concerns after your procedure please call [phone] M-F 8am-5:00 pm. After 5:00 pm, holidays or weekends call [phone] and ask for the GI Doctor on call.

## 2023-11-23 ENCOUNTER — Encounter: Admit: 2023-11-23 | Discharge: 2023-11-23 | Payer: MEDICARE

## 2023-11-23 LAB — CBC AND DIFF
~~LOC~~ BKR ABSOLUTE BASO COUNT: 0 10*3/uL (ref 0.00–0.20)
~~LOC~~ BKR ABSOLUTE BASO COUNT: 0 10*3/uL (ref 0.00–0.20)
~~LOC~~ BKR ABSOLUTE EOS COUNT: 0.1 10*3/uL (ref 0.00–0.45)
~~LOC~~ BKR ABSOLUTE EOS COUNT: 0.1 10*3/uL (ref 0.00–0.45)
~~LOC~~ BKR ABSOLUTE LYMPH COUNT: 1.6 10*3/uL (ref 1.00–4.80)
~~LOC~~ BKR ABSOLUTE LYMPH COUNT: 1.8 10*3/uL (ref 1.00–4.80)
~~LOC~~ BKR ABSOLUTE MONO COUNT: 0.5 10*3/uL (ref 0.00–0.80)
~~LOC~~ BKR ABSOLUTE MONO COUNT: 0.5 10*3/uL (ref 0.00–0.80)
~~LOC~~ BKR ABSOLUTE NEUTROPHIL: 4.7 10*3/uL (ref 1.80–7.00)
~~LOC~~ BKR ABSOLUTE NEUTROPHIL: 3.7 10*3/uL (ref 1.80–7.00)
~~LOC~~ BKR BASOPHILS %: 0.7 % (ref 0.0–2.0)
~~LOC~~ BKR BASOPHILS %: 0.7 % (ref 0.0–2.0)
~~LOC~~ BKR EOSINOPHILS %: 1.9 % (ref 0.0–5.0)
~~LOC~~ BKR EOSINOPHILS %: 2.3 % (ref 0.0–5.0)
~~LOC~~ BKR HEMATOCRIT: 26 % — ABNORMAL LOW (ref 40.0–50.0)
~~LOC~~ BKR HEMATOCRIT: 27 % — ABNORMAL LOW (ref 40.0–50.0)
~~LOC~~ BKR HEMOGLOBIN: 9.2 g/dL — ABNORMAL LOW (ref 13.5–16.5)
~~LOC~~ BKR LYMPHOCYTES %: 22 % — ABNORMAL LOW (ref 24.0–44.0)
~~LOC~~ BKR LYMPHOCYTES %: 30 % (ref 24.0–44.0)
~~LOC~~ BKR MCH: 30 pg (ref 26.0–34.0)
~~LOC~~ BKR MCH: 30 pg (ref 26.0–34.0)
~~LOC~~ BKR MCHC: 35 g/dL (ref 32.0–36.0)
~~LOC~~ BKR MCHC: 35 g/dL (ref 32.0–36.0)
~~LOC~~ BKR MCV: 87 fL (ref 80.0–100.0)
~~LOC~~ BKR MCV: 87 fL (ref 80.0–100.0)
~~LOC~~ BKR MONOCYTES %: 7.7 % (ref 4.0–12.0)
~~LOC~~ BKR MONOCYTES %: 7.5 % (ref 4.0–12.0)
~~LOC~~ BKR MPV: 8.5 fL (ref 7.0–11.0)
~~LOC~~ BKR MPV: 8.4 fL (ref 7.0–11.0)
~~LOC~~ BKR NEUTROPHILS %: 67 % (ref 41.0–77.0)
~~LOC~~ BKR NEUTROPHILS %: 59 % (ref 41.0–77.0)
~~LOC~~ BKR PLATELET COUNT: 192 10*3/uL (ref 150–400)
~~LOC~~ BKR PLATELET COUNT: 190 10*3/uL (ref 150–400)
~~LOC~~ BKR RBC COUNT: 3 10*6/uL — ABNORMAL LOW (ref 4.40–5.50)
~~LOC~~ BKR RDW: 12 % (ref 11.0–15.0)
~~LOC~~ BKR RDW: 13 % (ref 11.0–15.0)
~~LOC~~ BKR WBC COUNT: 7.1 10*3/uL (ref 4.50–11.00)
~~LOC~~ BKR WBC COUNT: 6.1 10*3/uL — ABNORMAL HIGH (ref 4.50–11.00)

## 2023-11-23 LAB — BASIC METABOLIC PANEL
~~LOC~~ BKR ANION GAP: 8 10*3/uL — ABNORMAL HIGH (ref 3–12)
~~LOC~~ BKR BLD UREA NITROGEN: 19 mg/dL — ABNORMAL HIGH (ref 7–25)
~~LOC~~ BKR CALCIUM: 9 mg/dL — ABNORMAL HIGH (ref 8.5–10.6)
~~LOC~~ BKR CO2: 24 mmol/L — ABNORMAL HIGH (ref 21–30)
~~LOC~~ BKR CREATININE: 0.7 mg/dL — ABNORMAL LOW (ref 0.40–1.24)
~~LOC~~ BKR GLOMERULAR FILTRATION RATE (GFR): >60 mL/min — ABNORMAL HIGH (ref >60–4.80)

## 2023-11-23 LAB — POC GLUCOSE
~~LOC~~ BKR POC GLUCOSE: 140 mg/dL — ABNORMAL HIGH (ref 70–100)
~~LOC~~ BKR POC GLUCOSE: 263 mg/dL — ABNORMAL HIGH (ref 70–100)
~~LOC~~ BKR POC GLUCOSE: 264 mg/dL — ABNORMAL HIGH (ref 70–100)

## 2023-11-23 MED ORDER — CEFTRIAXONE INJ 1GM IVP
1 g | Freq: Once | INTRAVENOUS | 0 refills | Status: CP
Start: 2023-11-23 — End: ?
  Administered 2023-11-23: 17:00:00 1 g via INTRAVENOUS

## 2023-11-23 MED ORDER — LACTATED RINGERS IV BOLUS
500 mL | Freq: Once | INTRAVENOUS | 0 refills | Status: AC
Start: 2023-11-23 — End: ?

## 2023-11-23 MED ORDER — PHENYLEPHRINE-COCOA BUTTER 0.25-88.44 % RE SUPP
1 | Freq: Two times a day (BID) | RECTAL | 0 refills | Status: AC
Start: 2023-11-23 — End: ?
  Administered 2023-11-23 – 2023-11-27 (×8): 1 via RECTAL

## 2023-11-23 MED ORDER — PROPRANOLOL 10 MG PO TAB
10 mg | Freq: Two times a day (BID) | NASOGASTRIC | 0 refills | Status: DC
Start: 2023-11-23 — End: 2023-11-30
  Administered 2023-11-24 – 2023-11-29 (×11): 10 mg via NASOGASTRIC

## 2023-11-23 MED ORDER — OLANZAPINE 10 MG IM SOLR
5 mg | INTRAMUSCULAR | 0 refills | Status: DC | PRN
Start: 2023-11-23 — End: 2023-11-28
  Administered 2023-11-23: 22:00:00 5 mg via INTRAMUSCULAR

## 2023-11-23 MED ADMIN — WATER FOR INJECTION, STERILE IJ SOLN [79513]: 10 mL | INTRAMUSCULAR | @ 22:00:00 | Stop: 2023-11-23 | NDC 00409488717

## 2023-11-24 ENCOUNTER — Encounter: Admit: 2023-11-24 | Discharge: 2023-11-24 | Payer: MEDICARE

## 2023-11-24 LAB — POC GLUCOSE
~~LOC~~ BKR POC GLUCOSE: 119 mg/dL — ABNORMAL HIGH (ref 70–100)
~~LOC~~ BKR POC GLUCOSE: 100 mg/dL (ref 70–100)
~~LOC~~ BKR POC GLUCOSE: 103 mg/dL — ABNORMAL HIGH (ref 70–100)
~~LOC~~ BKR POC GLUCOSE: 238 mg/dL — ABNORMAL HIGH (ref 70–100)
~~LOC~~ BKR POC GLUCOSE: 248 mg/dL — ABNORMAL HIGH (ref 70–100)
~~LOC~~ BKR POC GLUCOSE: 99 mg/dL (ref 70–100)

## 2023-11-24 LAB — CBC
~~LOC~~ BKR MCH: 31 pg — ABNORMAL HIGH (ref 26.0–34.0)
~~LOC~~ BKR MCHC: 35 g/dL — ABNORMAL HIGH (ref 32.0–36.0)
~~LOC~~ BKR MCV: 88 fL — ABNORMAL HIGH (ref 80.0–100.0)
~~LOC~~ BKR MPV: 8.3 fL (ref 7.0–11.0)
~~LOC~~ BKR PLATELET COUNT: 216 10*3/uL (ref 150–400)
~~LOC~~ BKR RDW: 13 % — ABNORMAL HIGH (ref 11.0–15.0)

## 2023-11-24 MED ORDER — LACTATED RINGERS IV BOLUS
500 mL | Freq: Once | INTRAVENOUS | 0 refills | Status: CP
Start: 2023-11-24 — End: ?
  Administered 2023-11-24: 19:00:00 500 mL via INTRAVENOUS

## 2023-11-24 NOTE — Progress Notes
 PHYSICAL THERAPY  ASSESSMENT      Name: Kevin Obrien   MRN: [id number]     DOB: [birth year]      Age: 73 y.o.  Admission Date: 11/21/2023     LOS: 3 days     Date of Service: 11/24/2023      Mobility  Patient Turn/Position: Left  Mobility Level [facility] Highest Level of Mobility (JH-HLM): Sit edge on of bed  Level of Assistance: Assist X2  Assistive Device: Hand Held;Friction reducing slide sheet  Activity Limited By: Mental Status Variability;Fatigue;Lethargy;Dizziness;Weakness;Patient request to stop    Subjective  Significant Hospital Events: PMH of BPH, diabetes mellitus, HTN, HLD, CAD s/p PCI, Acute right pontomedullary infarct admitted to [facility] 5/19, transferred now for hematochezia.  Special Considerations: Feeding tube present  Mental / Cognitive: Alert;Oriented;To person;Cooperative;Inconsistent with command following;Confused (impulsive)  Pain: Complains of pain;Does not rate pain  Pain level: Before activity;During activity;After activity  Pain Location: Abdomen  Pain Description: Generalized pain-does not locate  Pain Interventions: Patient agrees to participate in therapy with current pain level;Patient assisted into position of comfort  Persons Present: Rehabilitation technician;Nursing Staff;Daughter (RN exits room at PT arrival, daughter presents at end of session)    Home Living Situation  Lives With: Family  Type of Home: House  Entry Stairs: 2  In-Home Stairs: Able to live on main level  Bathroom Setup: Tub/shower unit  Patient Owned Equipment: Cane: Single point    Prior Level of Function  Level Of Independence: Independent with ADL and household mobility with device  Comments: intermittent use of SPC per daughter  History of Falls in Past 3 Months: Yes  Occupation/Education: High school level education;Employed  Comments: Above information gained via EMR. Patient with recent CVA and subsequent discharge to [facility] 10/28/23, now readmitted 6/12.    ROM  ROM Comments: Limited formal assessment given decreased alertness, responses. Functionally able reach with BUEs for hand held support and railing during supine to sit as well as advance BLEs towards edge of bed with cues to sit up.    Strength  Overall Strength: Generalized weakness  Strength Comments: unable to formally assess due to decreased participation, cognition    Bed Mobility/Transfer  Bed Mobility: Supine to Sit: Maximum Assist;x2 People  Bed Mobility: Sit to Supine: Maximum Assist;x2 People  Comments: Patient remains seated x3 minutes with moderate to maximal assistance for sitting balance. Endorses dizziness upon reaching edge of bed. When asked, reports symptoms are better. Requests return to supine 2/2 fatigue.  End Of Activity Status: In Bed;Nursing Notified;Instructed Patient to Request Assist with Mobility;Instructed Patient to Use Call Light    Balance  Sitting Balance: Static Sitting Balance;Dynamic Sitting Balance;Maximal Assist;Moderate Assist    Activity/Exercise  Sit Edge Of Bed: 3 minutes  Sit Edge Of Bed Assist: Moderate  Assist;Maximum Assist;Variable;Verbal Cues    Education  Persons Educated: Patient/Family  Patient Barriers To Learning: Impaired Communication;Cognitive Deficits;Confusion;Decreased Alertness  Interventions: Repetition of Instructions  Teaching Methods: Verbal Instruction  Patient Response: More Instruction Required  Topics: Plan/Goals of PT Interventions;Use of Assistive Device/Orthosis;Mobility Progression;Safety Awareness;Up with Assist Only;Importance of Increasing Activity;Recommend Continued Therapy    Assessment/Progress  Impaired Mobility Due To: Cognitive Deficits;Safety Concerns;Decreased Activity Tolerance;Decreased Level of Alertness;Deconditioning;Medical Status Limitation;Decreased Strength  Impaired Strength Due To: Medical Status Limitation;Deconditioning;Decreased Level of Alertness;Decreased Activity Tolerance;Cognitive Deficits  Assessment/Progress: Should Improve w/ Continued PT    Patient requires maximal assistance of 2 for transfer to/from edge of bed this date. Limited largely by cognition and  decreased alertness. Wakes easily to agree to participate, demonstrates delayed responses and is withdrawn to lethargic throughout. Tolerates sitting x3 minutes prior to report of fatigue and request to return to supine. Per EMR, patient demonstrated limited participation with therapies in IPR secondary to cognition and behaviors. Recommend continued therapy - PT to follow.    AM-PAC 6 Clicks Basic Mobility Inpatient  Turning from your back to your side while in a flat bed without using bed rails: A Little  Moving from lying on your back to sitting on the side of a flat bed without using bedrails : Total  Moving to and from a bed to a chair (including a wheelchair): Total  Standing up from a chair using your arms (e.g. wheelchair, or bedside chair): Total  To walk in hospital room: Total  Climbing 3-5 steps with a railing: Total  Basic Mobility Inpatient Raw Score: 8  Standardized (T-scale) Score: 22.61  Mobility Goal [facility]: JH-HLM 3 Sit Edge of Bed    Goals  Goal Formulation: Patient Unable to Participate in Goal Setting  Time For Goal Achievement: 7 days  Patient Will Go Supine To/From Sit: w/ Moderate Assist  Patient Will Transfer Bed/Chair: w/ Assist of 2  Patient Will Transfer Sit to Stand: w/ Assist of 2    Plan  Treatment Interventions: Mobility training;Strengthening;Balance activities;Endurance training  Plan Frequency: 1-2 Days per Week  PT Plan for Next Visit: upright tolerance, bed mobility, sitting balance; transfer to chair as able pending cognition    PT Discharge Recommendations  Recommendation: Inpatient setting  Patient Currently Requires Physical Assist With: All mobility;All personal care ADLs;All home functioning ADLs    Therapist  Damien Bless, PT, DPT  Date  11/24/2023

## 2023-11-24 NOTE — Progress Notes
 General Progress Note    Name:  Kevin Obrien   Today's Date:  11/24/2023  Admission Date: 11/21/2023  LOS: 3 days                     Assessment/Plan:    Principal Problem:    Hematochezia  Active Problems:    Adjustment disorder with mixed anxiety and depressed mood    Severe malnutrition    This is a  73 y.o. male with past medical history of BPH, diabetes mellitus, hypertension, hyperlipidemia, coronary artery disease status post PCI, Acute right pontomedullary infarct admitted to rehab, transferred now for hematochezia.      Hematochezia -improving  Diverticulosis  - Has bright red blood in stools   - HB on 6/12 AM: 11  - Iron studies 11/18/2023: Iron 104, % saturation 26, TIBC 398, ferritin 208, transferrin 267  - Was currently on aspirin  and Plavix  for recent stroke  -EGD unremarkable.  Colonoscopy showed diverticulosis but no active bleeding  Plan:  - Gastroenterology consulted.   - Hold Plavix  for now  - Stopping Protonix    - Blood transfusion consent obtained       UTI -present on admission  S/p Foley cath -was placed on admission arrival  Urinary retention periodically since stroke  - Urine culture from 11/17/2023: >100,000 E coli resistant to Bactrim   - Status post 5 days ceftriaxone   -Foley catheter removal and trial with as needed bladder scan and straight cathing 6/15     Hypertension-now hypotensive  - Hold PTA amlodipine  5 mg, propranolol  10 mg twice daily with parameters     CAD status post PCI  - Continue PTA aspirin      Recent right pontomedullary CVA  Expressive aphasia  - Hold PTA Plavix  as above  - Continue PTA aspirin , atorvastatin   - PT/OT   -Psychiatric consultation for some agitation   Recommendations:  Continue mirtazapine  15 mg QHS for mood, sleep, and appetite   Change trazodone  25 mg BID PRN for agitation  Will schedule trazodone  25 mg QHS for insomnia  Patient denies SI and does not have a plan or intention of harming himself or ending his own life. Continue telesitter per nursing discretion in the setting of impulsivity and elopement risk.   Neurorehabilitation Psychology consulted. Agree with and appreciate recommendations.  Psychiatry will continue to follow  -Speech therapy to evaluate swallowing      --> Given that currently he has pulled PEG tube out will put mittens and also IR order to replace it, adding Zyprexa  IM as needed for agitation since we do not have a good route of med administration      Type 2 diabetes  - Continue PTA Lantus  17 units  - LDCF     Hyperlipidemia  - Continue PTA rosuvastatin  40 mg           Diet: NPO  DVT PPx: Holding in setting of GI bleed  Code Status: Full code  Disposition : Need to be working on placement likely in a few days to SNF      This visit is prolonged due to significant discussion about plan of care and answered all question of multiple family members at the bedside and reviewing the plan of care with nursing staff.  This more than 80 minutes for caring for this patient today.  Nutrition: Dietitian Documentation ICD-10 code E43: Acute illness/Severe malnutrition    Weight loss: Greater than 5% x 1 month, Moderate loss of  muscle mass, Moderate loss of body fat      Loss of Subcutaneous Fat: Yes Moderate Orbital, Triceps  Muscle Wasting: Yes Moderate Clavicle, Temple         Malnutrition Interventions: TF recs provided  Wound: Wound Documentation             ________________________________________________________________________    Subjective  Kevin Obrien is a 73 y.o. male.  No acute overnight event.          Medications  Scheduled Meds:[Held by Provider] amLODIPine  (NORVASC ) tablet 5 mg, 5 mg, Per NG tube, QDAY  aspirin  chewable tablet 81 mg, 81 mg, Per NG tube, QDAY  Diet Enteral Feeding Bolus, , SEE ADMIN INSTRUCTIONS, QID (1,87,83,79)   And  Water  Bolus, 100 mL, PEG Tube, Q4H  insulin  aspart (U-100) (NOVOLOG  FLEXPEN U-100 INSULIN ) injection PEN 0-6 Units, 0-6 Units, Subcutaneous, ACHS (22)  insulin  glargine (LANTUS  SOLOSTAR U-100 INSULIN ) injection PEN 17 Units, 17 Units, Subcutaneous, QHS(22)  mirtazapine  (REMERON ) tablet 15 mg, 15 mg, Oral, QHS  petrolatum  (STYE) ophthalmic ointment 0.25 inch, 0.25 inch, Both Eyes, TID  phenylephrine -cocoa butter (PREPARATION H(phenyleph,cocoa buttr)) hemorrhoidal prep rectal suppository 1 suppository, 1 suppository, Rectal, BID  propranoloL  (INDERAL ) tablet 10 mg, 10 mg, Per NG tube, BID  rosuvastatin  (CRESTOR ) tablet 40 mg, 40 mg, Oral, QDAY  thiamine  mononitrate (vit B1) tablet 100 mg, 100 mg, Per NG tube, QDAY  trazodone (#) (DESYREL ) suspension 25 mg, 25 mg, Feeding Tube, QHS    Continuous Infusions:      PRN and Respiratory Meds:acetaminophen  Q4H PRN, dextrose  50% (D50) IV PRN, melatonin QHS PRN, OLANZapine  Q8H PRN, pancrelipase  20,880 Units/sodium bicarbonate  650 mg ([place] CLOG DESTROYER) PRN (On Call from Rx), phenoL PRN, sennosides-docusate sodium  QDAY PRN, trazodone (#) BID PRN        Objective:                          Vital Signs: Last Filed                 Vital Signs: 24 Hour Range   BP: 138/50 (06/15 1518)  Temp: 36.4 ?C (97.5 ?F) (06/15 1518)  Pulse: 73 (06/15 1518)  Respirations: 16 PER MINUTE (06/15 1518)  SpO2: 99 % (06/15 1518)  O2 Device: None (Room air) (06/15 1518) BP: (106-152)/(45-59)   Temp:  [36.4 ?C (97.5 ?F)-36.8 ?C (98.3 ?F)]   Pulse:  [73-91]   Respirations:  [16 PER MINUTE-18 PER MINUTE]   SpO2:  [93 %-99 %]   O2 Device: None (Room air)   PAINAD Total Score: 4 (11/23/23 2130) Vitals:    11/21/23 2323 11/22/23 1318   Weight: 59.3 kg (130 lb 11.7 oz) 59 kg (130 lb)       Intake/Output Summary:  (Last 24 hours)    Intake/Output Summary (Last 24 hours) at 11/24/2023 1807  Last data filed at 11/24/2023 0335  Gross per 24 hour   Intake 0 ml   Output 650 ml   Net -650 ml           Physical Exam    General Appearance: Weak and ill-appearing and cachectic  Head: NC/AT  Lungs: No respiratory distress on room air  Heart: RRR,  Abdomen: Rectal tube having frank blood in it, Foley catheter in place with clear urine  Neurologic: Disoriented and not following commands      Lab Review  Pertinent labs reviewed    Point of Care Testing  (Last 24  hours)  Glucose: 98 (11/24/23 0634)  POC Glucose (Download): (!) 103 (11/24/23 1739)    Radiology and other Diagnostics Review:    Pertinent radiology reviewed.    Chen Holzman, MD   Pager

## 2023-11-25 ENCOUNTER — Encounter: Admit: 2023-11-25 | Discharge: 2023-11-25 | Payer: MEDICARE

## 2023-11-25 ENCOUNTER — Inpatient Hospital Stay: Admit: 2023-11-25 | Discharge: 2023-11-25 | Payer: MEDICARE

## 2023-11-25 LAB — POC GLUCOSE
~~LOC~~ BKR POC GLUCOSE: 109 mg/dL — ABNORMAL HIGH (ref 70–100)
~~LOC~~ BKR POC GLUCOSE: 89 mg/dL (ref 70–100)
~~LOC~~ BKR POC GLUCOSE: 90 mg/dL (ref 70–100)

## 2023-11-25 MED ORDER — DIATRIZOATE MEG-DIATRIZOAT SOD 66-10 % PO SOLN
30 mL | Freq: Once | GASTROSTOMY | 0 refills | Status: CP
Start: 2023-11-25 — End: ?
  Administered 2023-11-25: 19:00:00 30 mL via GASTROSTOMY

## 2023-11-25 NOTE — Unmapped
 Immediate Post Procedure Note    Date:  11/25/2023                                                                         Attending Physician:   Dr. Juliene Endow, MD   Performing Provider:  Sherwin LILLETTE Kast, APRN-NP    Procedure(s):  Gtube exchange  Pre/Post procedure diagnosis:   Findings:  18 Fr gtube placed via patent tract  Anesthesia: None  Sedation/Medication Plan: None    Time out performed: Consent obtained, correct patient verified, correct procedure verified, correct site verified, patient marked as necessary.  Estimated Blood Loss:  None/Negligible  Specimen(s) Removed/Disposition:  None  Complications: None  Post-Procedure Condition: Stable  Comments: KUB with gastrografin  pending for placement verification     Sherwin LILLETTE Kast, APRN-NP  Pgr [phone]

## 2023-11-25 NOTE — Case Management (ED)
 Case Management Progress Note    NAME:Kevin Obrien                          MRN: [id number]              DOB:09/11/1950          AGE: 73 y.o.  ADMISSION DATE: 11/21/2023             DAYS ADMITTED: LOS: 4 days      Today's Date: 11/25/2023    PLAN: Discharge to SNF once medically stable    Expected Discharge Date: 11/26/2023   Is Patient Medically Stable: No, Please explain: see provider note  Are there Barriers to Discharge? no    INTERVENTION/DISPOSITION:  Met with pt and family at bedside to discuss discharge to SNF.  Family has toured [address] and [facility] and would like referrals sent to both.  [facility] is family's first choice if they are abel to accept.  SW sent referrals.      Discharge Planning                 Transportation                 Support                 Info or Referral                 Positive SDOH Domains and Potential Barriers                   Medication Needs                                                                                                                                                         Financial                 Legal                 Other                 Discharge Disposition  Kevin Obrien  Available on Voalte  [phone]

## 2023-11-25 NOTE — Progress Notes
 Patient arrives to [facility] minor procedure room to have G-tube assessed and exchanged.  Site and focused assessment completed.  Standard 74fr G-tube exchanged by APRN Haroldine without complication, pain free.  G-tube placement confirmed by contrasted AP/Lat ABD xray read by Dr. Alphonzo. Report given to bedside RN, Cade.

## 2023-11-25 NOTE — Progress Notes
 OCCUPATIONAL THERAPY  NOTE   Name: Alexey Rhoads   MRN: [id number]     DOB: [birth year]      Age: 73 y.o.  Admission Date: 11/21/2023     LOS: 4 days     Date of Service: 11/25/2023      Attempted to see patient at scheduled time with rehab tech. Patient declines therapy due to pain. Ensured all needs were met. OT will continue to follow.    Therapist: Jillyn Harlem, OTR/L [phone]  Date: 11/25/2023

## 2023-11-25 NOTE — Progress Notes
 Gastroenterology Consultation - Follow Up      HISTORY OF PRESENT ILLNESS  Kevin Obrien is a 73 y.o. male with a history of BPH, diabetes mellitus, hypertension, hyperlipidemia, coronary artery disease status post PCI, Acute r pontomedullary infarct admitted to rehab. GI was consulted for hematochezia. The patient was recently diagnosed with a stroke and has been managed with dual antiplatelet therapy (aspirin  and Plavix ). Over the past few days, he has experienced hematochezia, with passage of blood clots and maroon-colored stools noted yesterday. He denies melena, nausea, or vomiting. He also reported abdominal pain, which has improved since initiating antibiotics for a recently diagnosed urinary tract infection. Labs show Hgb 11, baseline around 14. GI recommended further evaluation with EGD/colonoscopy.      24 Hour Events/Subjective:   S/p EGD/colonoscopy Friday - see summaries below. Path pending. No further overt GI bleeding reported. Hgb stable. Patient denies nausea/vomiting or abdominal pain.     REVIEW OF MEDICAL RECORDS  Past Medical History:    Accidental fall    Angina pectoris    Arthritis    BPH (benign prostatic hyperplasia)    Cataract    Constipation    Coronary artery disease    DM (diabetes mellitus) (CMS-HCC)    ED (erectile dysfunction)    Heart attack (CMS-HCC)    Hyperlipidemia    Hypertension    Joint pain    Neuropathy    Shingles     Surgical History:   Procedure Laterality Date    CORONARY STENT PLACEMENT  2016    HX CHOLECYSTECTOMY  09/2015    COLONOSCOPY N/A 04/20/2016    Performed by Rogers Kipper, MD at [facility] ENDO    ESOPHAGOGASTRODUODENOSCOPY N/A 04/20/2016    Performed by Rogers Kipper, MD at [facility] ENDO    ESOPHAGOGASTRODUODENOSCOPY BIOPSY  04/20/2016    Performed by Rogers Kipper, MD at [facility] ENDO    UREA BREATH TEST N/A 11/13/2016    Performed by Ledora Catalina, MD at [facility] ENDO    ESOPHAGOGASTRODUODENOSCOPY WITH BIOPSY - FLEXIBLE N/A 11/22/2023    Performed by Bonnetta Camellia BROCKS, MD at [facility] ENDO    COLONOSCOPY WITH BIOPSY - FLEXIBLE N/A 11/22/2023    Performed by Bonnetta Camellia BROCKS, MD at [facility] ENDO     Social History     Socioeconomic History    Marital status: Separated    Number of children: 3   Tobacco Use    Smoking status: Former     Current packs/day: 0.00     Average packs/day: 2.0 packs/day for 10.0 years (20.0 ttl pk-yrs)     Types: Cigarettes     Start date: 04/11/1961     Quit date: 04/12/1971     Years since quitting: 52.6    Smokeless tobacco: Never    Tobacco comments:     Former smoker/quit years ago   Vaping Use    Vaping status: Never Used   Substance and Sexual Activity    Alcohol use: Not Currently    Drug use: Never    Sexual activity: Not Currently     Partners: Female     Birth control/protection: None     Family History   Problem Relation Name Age of Onset    Diabetes Mother AMPARO CUBIAS     Diabetes Father JOSE Dedominicis     Cancer-Lung Father JOSE Glogowski     Melanoma Neg Hx      Cataract Neg Hx      Glaucoma Neg Hx  Macular Degen Neg Hx       Allergies   Allergen Reactions    Seasonal Allergies RHINITIS     Current Facility-Administered Medications   Medication Dose Route Frequency Provider Last Rate Last Admin    acetaminophen  oral solution 650 mg  650 mg PEG Tube Q4H PRN Provider, Pharmacy   650 mg at 11/23/23 0832    [Held by Provider] amLODIPine  (NORVASC ) tablet 5 mg  5 mg Per NG tube QDAY Samanta, Ipsita, MD   5 mg at 11/23/23 9170    aspirin  chewable tablet 81 mg  81 mg Per NG tube QDAY Samanta, Ipsita, MD   81 mg at 11/24/23 1023    dextrose  50% (D50) syringe 25-50 mL  12.5-25 g Intravenous PRN Samanta, Ipsita, MD        Diet Enteral Feeding Bolus   SEE ADMIN INSTRUCTIONS QID (1,87,83,79) Folscroft, Lauraine BIRCH, MD   Given at 11/23/23 1219    And    Water  Bolus  100 mL PEG Tube Q4H Folscroft, Lauraine BIRCH, MD   100 mL at 11/23/23 1219    insulin  aspart (U-100) (NOVOLOG  FLEXPEN U-100 INSULIN ) injection PEN 0-6 Units  0-6 Units Subcutaneous ACHS (22) Samanta, Ipsita, MD   3 Units at 11/23/23 1743    insulin  glargine (LANTUS  SOLOSTAR U-100 INSULIN ) injection PEN 17 Units  17 Units Subcutaneous QHS(22) Samanta, Ipsita, MD   17 Units at 11/24/23 2133    melatonin tablet 5 mg  5 mg Oral QHS PRN Samanta, Ipsita, MD   5 mg at 11/25/23 0348    mirtazapine  (REMERON ) tablet 15 mg  15 mg Oral QHS Samanta, Ipsita, MD   15 mg at 11/24/23 2112    OLANZapine  (ZyPREXA ) injection 5 mg  5 mg Intramuscular Q8H PRN Sattarin, Arash, MD   5 mg at 11/23/23 1650    pancrelipase  20,880 Units/sodium bicarbonate  650 mg ([place] CLOG DESTROYER)   Feeding Tube PRN (On Call from Rx) Folscroft, Lauraine BIRCH, MD        petrolatum  (STYE) ophthalmic ointment 0.25 inch  0.25 inch Both Eyes TID Samanta, Ipsita, MD   0.25 inch at 11/24/23 2116    phenoL (CHLORASEPTIC) spray 2 spray  2 spray Mouth/Throat PRN Samanta, Ipsita, MD        phenylephrine -cocoa butter (PREPARATION H(phenyleph,cocoa buttr)) hemorrhoidal prep rectal suppository 1 suppository  1 suppository Rectal BID Sattarin, Arash, MD   1 suppository at 11/24/23 2113    propranoloL  (INDERAL ) tablet 10 mg  10 mg Per NG tube BID Sattarin, Arash, MD   10 mg at 11/24/23 2113    rosuvastatin  (CRESTOR ) tablet 40 mg  40 mg Oral QDAY Samanta, Ipsita, MD   40 mg at 11/24/23 1023    sennosides-docusate sodium  (SENOKOT-S) tablet 1 tablet  1 tablet Oral QDAY PRN Samanta, Ipsita, MD        thiamine  mononitrate (vit B1) tablet 100 mg  100 mg Per NG tube QDAY Samanta, Ipsita, MD   100 mg at 11/24/23 1023    trazodone (#) (DESYREL ) suspension 25 mg  25 mg Feeding Tube BID PRN Fox, Clare E, APRN-NP   25 mg at 11/22/23 2218    trazodone (#) (DESYREL ) suspension 25 mg  25 mg Feeding Tube QHS Brinda Freud E, APRN-NP   25 mg at 11/24/23 2112     REVIEW OF SYSTEMS  HEENT: No bleeding gums   Respiratory: No wheezing   Gastrointestinal: See HPI above.     PHYSICAL EXAMINATION  Vital Signs: BP 138/64 (  BP Source: Arm, Left Upper)  - Pulse 80  - Temp 37.1 ?C (98.8 ?F)  - Ht 165.1 cm (5' 5)  - Wt 59 kg (130 lb)  - SpO2 96%  - BMI 21.63 kg/m?   Body mass index is 21.63 kg/m?SABRA  Constitutional: In no acute distress   Eyes: Normal conjunctivae   Ears, Nose, Throat, and Mouth: Normal hearing and appropriate speech  Respiratory: Non-labored breathing  Cardiovascular: Normal heart rate   Gastrointestinal: Abdomen soft, non-distended, and non-tender; +BS   Neurologic: Awake and alert     RECENT LABS: Pertinent labs reviewed.    RADIOLOGY: No pertinent recent radiology images/reports to review.     GI PROCEDURES:    EGD (11/22/2023)  - Z-line regular, 40 cm from the incisors.   - Normal esophagus.   - Intact gastrostomy with a patent G-tube   present characterized by healthy appearing   mucosa with adjacent retained T-tag.   - Normal examined duodenum.   - Biopsies were taken with a cold forceps for   Helicobacter pylori testing.     Colonoscopy (11/22/2023)  - Diverticulosis throughout the left colon.   Fresh blood clots and liquid blood throughout   the left colon into her lesser extent in the   transverse colon. Normal terminal ileum.   Otherwise normal exam including retroflexion in   the rectum.   - No specimens collected.   - Bleeding diverticular. Appears to have ceased.     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  H. pylori stool antigen in 2018 was positive.    Colonoscopy 2017: Moderate diverticulosis in sigmoid colon    ASSESSMENT AND PLAN  # Hematochezia - likely resolved diverticular bleed  # Recent stroke on aspirin  and Plavix   # G tube   # History of H. pylori infection  # UTI  # AKI    Recommendations:  - Await pathology results (performing endoscopist to follow up)  - Trend H/H, transfuse pRBCs per protocol if Hgb <7gm/dl  - Monitor for signs/symptoms of GI bleeding   - Repeat colonoscopy is not recommended for screening purposes    Total Time Today was 35 minutes in the following activities: Preparing to see the patient, Obtaining and/or reviewing separately obtained history, Performing a medically appropriate examination and/or evaluation, Counseling and educating the patient/family/caregiver, Documenting clinical information in the electronic or other health record, Independently interpreting results (not separately reported) and communicating results to the patient/family/caregiver, and Care coordination (not separately reported)     Patient discussed and plan agreed upon with Dr. Srinivasan. GI will sign off. Thank you for this consultation. It has been a pleasure to be a part of the care team. Please feel free to reach back out to the GI consult team with any further questions or concerns.    Nat Rush MSN, APRN, FNP-C   [facility]  Division of Gastroenterology  Available via [place] when on service, otherwise please contact GI fellow

## 2023-11-25 NOTE — Progress Notes
 Patient profile updated with listed emergency contact via telephone, except Abuse.

## 2023-11-25 NOTE — Progress Notes
 General Progress Note    Name:  Dean Goldner   Today's Date:  11/25/2023  Admission Date: 11/21/2023  LOS: 4 days                     Assessment/Plan:    Principal Problem:    Hematochezia  Active Problems:    Adjustment disorder with mixed anxiety and depressed mood    Severe malnutrition    Diverticular hemorrhage    This is a  73 y.o. male with past medical history of BPH, diabetes mellitus, hypertension, hyperlipidemia, coronary artery disease status post PCI, Acute right pontomedullary infarct admitted to rehab, transferred now for hematochezia.      Hematochezia -improving  Diverticulosis  - Has bright red blood in stools   - HB on 6/12 AM: 11  - Iron studies 11/18/2023: Iron 104, % saturation 26, TIBC 398, ferritin 208, transferrin 267  - Was currently on aspirin  and Plavix  for recent stroke  -EGD unremarkable.  Colonoscopy showed diverticulosis but no active bleeding  Plan:  - Gastroenterology consulted.   - Hold Plavix  for now  - Stopping Protonix    - Blood transfusion consent obtained       UTI - present on admission  S/p Foley cath -was placed on admission arrival  Urinary retention periodically since stroke  - Urine culture from 11/17/2023: >100,000 E coli resistant to Bactrim   - Status post 5 days ceftriaxone   -Foley catheter removal and trial with as needed bladder scan and straight cathing 6/15     Hypertension-now hypotensive  - Hold PTA amlodipine  5 mg, propranolol  10 mg twice daily with parameters     CAD status post PCI  - Continue PTA aspirin        Recent right pontomedullary CVA  Expressive aphasia  - Hold PTA Plavix  as above  - Continue PTA aspirin , atorvastatin   - PT/OT   -Psychiatric consultation for some agitation   Recommendations:  Continue mirtazapine  15 mg QHS for mood, sleep, and appetite   Change trazodone  25 mg BID PRN for agitation  Will schedule trazodone  25 mg QHS for insomnia  Patient denies SI and does not have a plan or intention of harming himself or ending his own life. Continue telesitter per nursing discretion in the setting of impulsivity and elopement risk.   Neurorehabilitation Psychology consulted. Agree with and appreciate recommendations.  Psychiatry will continue to follow  -Speech therapy to evaluate swallowing    Acute on subacute encephalopathy -family feels is more confused and weaker in bilateral upper extremity since last week, repeat CT head     --> Given that currently he has pulled PEG tube out will put mittens and also IR order to replace it, adding Zyprexa  IM as needed for agitation since we do not have a good route of med administration      Type 2 diabetes  - Continue PTA Lantus  17 units  - LDCF     Hyperlipidemia  - Continue PTA rosuvastatin  40 mg           Diet: NPO  DVT PPx: Holding in setting of GI bleed  Code Status: Full code  Disposition : Need to be working on placement likely in a few days to SNF    Spent more than caring for this patient today.     Nutrition: Dietitian Documentation ICD-10 code E43: Acute illness/Severe malnutrition    Weight loss: Greater than 5% x 1 month, Moderate loss of muscle mass, Moderate  loss of body fat      Loss of Subcutaneous Fat: Yes Moderate Orbital, Triceps  Muscle Wasting: Yes Moderate Clavicle, Temple         Malnutrition Interventions: TF recs provided  Wound: Wound Documentation             ________________________________________________________________________    Subjective  Curlee Dotter Iannello is a 73 y.o. male.  No acute overnight event.          Medications  Scheduled Meds:[Held by Provider] amLODIPine  (NORVASC ) tablet 5 mg, 5 mg, Per NG tube, QDAY  aspirin  chewable tablet 81 mg, 81 mg, Per NG tube, QDAY  Diet Enteral Feeding Bolus, , SEE ADMIN INSTRUCTIONS, QID (1,87,83,79)   And  Water  Bolus, 100 mL, PEG Tube, Q4H  insulin  aspart (U-100) (NOVOLOG  FLEXPEN U-100 INSULIN ) injection PEN 0-6 Units, 0-6 Units, Subcutaneous, ACHS (22)  insulin  glargine (LANTUS  SOLOSTAR U-100 INSULIN ) injection PEN 17 Units, 17 Units, Subcutaneous, QHS(22)  mirtazapine  (REMERON ) tablet 15 mg, 15 mg, Oral, QHS  petrolatum  (STYE) ophthalmic ointment 0.25 inch, 0.25 inch, Both Eyes, TID  phenylephrine -cocoa butter (PREPARATION H(phenyleph,cocoa buttr)) hemorrhoidal prep rectal suppository 1 suppository, 1 suppository, Rectal, BID  propranoloL  (INDERAL ) tablet 10 mg, 10 mg, Per NG tube, BID  rosuvastatin  (CRESTOR ) tablet 40 mg, 40 mg, Oral, QDAY  thiamine  mononitrate (vit B1) tablet 100 mg, 100 mg, Per NG tube, QDAY  trazodone (#) (DESYREL ) suspension 25 mg, 25 mg, Feeding Tube, QHS    Continuous Infusions:      PRN and Respiratory Meds:acetaminophen  Q4H PRN, dextrose  50% (D50) IV PRN, melatonin QHS PRN, OLANZapine  Q8H PRN, pancrelipase  20,880 Units/sodium bicarbonate  650 mg ([place] CLOG DESTROYER) PRN (On Call from Rx), phenoL PRN, sennosides-docusate sodium  QDAY PRN, trazodone (#) BID PRN        Objective:                          Vital Signs: Last Filed                 Vital Signs: 24 Hour Range   BP: 147/62 (06/16 1544)  Temp: 37.2 ?C (99 ?F) (06/16 1544)  Pulse: 103 (06/16 1544)  Respirations: 18 PER MINUTE (06/16 1544)  SpO2: 98 % (06/16 1544)  O2 Device: None (Room air) (06/16 0715) BP: (137-156)/(56-64)   Temp:  [36.4 ?C (97.5 ?F)-37.2 ?C (99 ?F)]   Pulse:  [74-103]   Respirations:  [16 PER MINUTE-18 PER MINUTE]   SpO2:  [96 %-100 %]   O2 Device: None (Room air)   PAINAD Total Score: 0 (11/24/23 2352) Vitals:    11/21/23 2323 11/22/23 1318   Weight: 59.3 kg (130 lb 11.7 oz) 59 kg (130 lb)       Intake/Output Summary:  (Last 24 hours)    Intake/Output Summary (Last 24 hours) at 11/25/2023 1643  Last data filed at 11/25/2023 1544  Gross per 24 hour   Intake 0 ml   Output 850 ml   Net -850 ml           Physical Exam    General Appearance: Weak and ill-appearing and cachectic  Head: NC/AT  Lungs: No respiratory distress on room air  Heart: RRR,  Abdomen: Rectal tube having frank blood in it, Foley catheter in place with clear urine  Neurologic: Disoriented and not following commands      Lab Review  Pertinent labs reviewed    Point of Care Testing  (Last 24 hours)  POC  Glucose (Download): 89 (11/25/23 1116)    Radiology and other Diagnostics Review:    Pertinent radiology reviewed.    Matthieu Loftus, MD   Pager

## 2023-11-26 LAB — POC GLUCOSE
~~LOC~~ BKR POC GLUCOSE: 261 mg/dL — ABNORMAL HIGH (ref 70–100)
~~LOC~~ BKR POC GLUCOSE: 132 mg/dL — ABNORMAL HIGH (ref 70–100)
~~LOC~~ BKR POC GLUCOSE: 272 mg/dL — ABNORMAL HIGH (ref 70–100)
~~LOC~~ BKR POC GLUCOSE: 427 mg/dL — ABNORMAL HIGH (ref 70–100)

## 2023-11-26 NOTE — Progress Notes
 PHYSICAL THERAPY  PROGRESS NOTE          Name: Kevin Obrien   MRN: [id number]     DOB: [birth year]      Age: 73 y.o.  Admission Date: 11/21/2023     LOS: 5 days     Date of Service: 11/26/2023    Mobility  Patient Turn/Position: Supine  Mobility Level [facility] Highest Level of Mobility (JH-HLM): Sit edge on of bed  Level of Assistance: Assist X2  Assistive Device: Friction reducing slide sheet  Activity Limited By: Mental Status Variability    Subjective  Significant Hospital Events: PMH of BPH, diabetes mellitus, HTN, HLD, CAD s/p PCI, Acute right pontomedullary infarct admitted to [facility] 5/19, transferred now for hematochezia.  Special Considerations: Feeding tube present  Mental / Cognitive: Oriented;To person;Inconsistent with command following;Confused;Lethargic;Tangential (intermittently tearful)  Pain: Complains of pain;Does not rate pain  Pain level: Before activity;During activity;After activity  Pain Location: Posterior;Head  Pain Interventions: Patient assisted into position of comfort;Patient agrees to participate in therapy with modifications to session;Patient pre-medicated  Persons Present: Rehabilitation technician    Home Living Situation  Lives With: Family  Type of Home: House  Entry Stairs: 2  In-Home Stairs: Able to live on main level  Bathroom Setup: Tub/shower unit  Patient Owned Equipment: Cane: Single point  Comments: Pt denies prior use of DME.    Prior Level of Function  Level Of Independence: Independent with ADL and household mobility with device  Comments: intermittent use of SPC per daughter  History of Falls in Past 3 Months: Yes  Occupation/Education: High school level education;Employed  Comments: Above information gained via EMR. Patient with recent CVA and subsequent discharge to [facility] 10/28/23, now readmitted 6/12.    Precautions  Precautions:  (PEG tube)    Strength  Strength Comments: unable to formally assess due to decreased participation, cognition      Bed Mobility/Transfer  Bed Mobility: Supine to Sit: Maximum Assist;x2 People  Bed Mobility: Sit to Supine: Maximum Assist;x2 People    Balance  Sitting Balance: Static Sitting Balance;Maximal Assist;Moderate Assist    Activity/Exercise  Sit Edge Of Bed: 2 minutes  Sit Edge Of Bed Assist: Maximum Assist;Moderate  Assist;Variable    Assessment/Progress  Impaired Mobility Due To: Cognitive Deficits;Safety Concerns;Decreased Activity Tolerance;Decreased Level of Alertness;Deconditioning;Medical Status Limitation;Decreased Strength  Impaired Strength Due To: Medical Status Limitation;Deconditioning;Decreased Level of Alertness;Decreased Activity Tolerance;Cognitive Deficits  Assessment/Progress: Should Improve w/ Continued PT  Comments: Pt presented with decreased alertness this date. Pt tolerated sitting edge of bed briefly. Pt tearful and holding head. Pt assisted back to bed. Pt continues to require assist of 2 for mobilty and requires skilled therapy.    AM-PAC 6 Clicks Basic Mobility Inpatient  Turning from your back to your side while in a flat bed without using bed rails: A lot  Moving from lying on your back to sitting on the side of a flat bed without using bedrails : A Lot  Moving to and from a bed to a chair (including a wheelchair): Total  Standing up from a chair using your arms (e.g. wheelchair, or bedside chair): Total  To walk in hospital room: Total  Climbing 3-5 steps with a railing: Total  Basic Mobility Inpatient Raw Score: 8  Standardized (T-scale) Score: 22.61  AM-PAC Basic Mobility Functional Stage: -11.95-33 Limited Movement  Mobility Goal [facility]: JH-HLM 3 Sit Edge of Bed    Goals  Goal Formulation: Patient Unable to Participate in Goal  Setting  Time For Goal Achievement: 7 days  Patient Will Go Supine To/From Sit: w/ Moderate Assist  Patient Will Transfer Bed/Chair: w/ Assist of 2  Patient Will Transfer Sit to Stand: w/ Assist of 2    Plan  Treatment Interventions: Mobility training;Strengthening;Balance activities;Endurance training  Plan Frequency: 1-2 Days per Week  PT Plan for Next Visit: upright tolerance, bed mobility, sitting balance; transfer to chair as able pending cognition    PT Discharge Recommendations  Recommendation: Inpatient setting  Patient Currently Requires Physical Assist With: All mobility;All personal care ADLs;All home functioning ADLs    Therapist: Rocky Piety, PT  Date: 11/26/2023

## 2023-11-26 NOTE — Progress Notes
 CLINICAL NUTRITION                                                        Clinical Nutrition Follow-Up Assessment     Name: Kevin Obrien   MRN: [id number]     DOB: 26-Apr-1951      Age: 73 y.o.  Admission Date: 11/21/2023     LOS: 5 days     Date of Service: 11/26/2023    Recommendation:  Continue bolus TF of 1.25 cartons Jevity 1.5 QID to total 5 cartons/day. Provide 100mL water  before and after each bolus feeding for hydration. This provides 1775kcal, 76g protein and total water  per day.     Comments:  73 year old male with history of BPH, diabetes mellitus, hypertension, hyperlipidemia, coronary artery disease status post PCI, Acute right pontomedullary infarct admitted to rehab, transferred now for hematochezia. Pt with PEG tube for feeding and is NPO per SLP recs. Information about recent TF orders obtained from rehab dietitian notes. Pt with AMS.   (6/17) Pt pulled out his PEG tube on 6/14 and a new tube was placed in IR on 6/16. Pt missed several bolus feeds and only met about 25% of his nutrition needs over the past 3 days.  Per bedside RN, Pt is now tolerating TFs well.                       Nutrition Assessment of Patient:  Admit Weight: 59.3 kg (bed scale);  ;    BMI (Calculated): 21.63;    Pertinent Allergies/Intolerances: none     Oral Diet Order: NPO;    Current EN Order: Jevity 1.5 boluse feeds 1.25 cartons 4 times a day (total 5 cartons)  Current Energy Intake: Inadequate    Intake Comment: 3 day avg  Intake (calories) Daily Average : 450 kilocalories (25% of estimated needs from EN)  Intake (protein) Daily Average : 19 grams (27% of estimated needs from EN)    Weight Used for Calculation: 59.3 kg  Estimated Calorie Needs: 1780kcal (30/kg admit wt)  Estimated Protein Needs: 71-83g (1.2-1.4/kg admit wt)    Malnutrition Assessment:  Malnutrition present on admission  ICD-10 code E43: Acute illness/Severe malnutrition    Weight loss: Greater than 5% x 1 month, Moderate loss of muscle mass, Moderate loss of body fat              Malnutrition Interventions: Nutrition needs provided by EN    Nutrition Focused Physical Exam:  Loss of Subcutaneous Fat: Yes; Severity: Moderate; Location: Orbital, Triceps  Muscle Wasting: Yes; Severity: Moderate; Location: Clavicle, Temple  Physical Assessment:        Pressure Injury: none noted     Comment: last BM 6/16    Nutrition Diagnosis:  Swallowing difficulty  Etiology: medical status  Signs & Symptoms: NPO with TF needs                      Intervention / Plan:  Will monitor EN adequacy/ tolerance, and adjust EN recs as needed.  Will monitor GI function, labs, and weights.       Delon Carter MS, RD, LD, CNSC  Available on Voalte Me   Office [phone]

## 2023-11-26 NOTE — Progress Notes
 SPEECH-LANGUAGE PATHOLOGY  CLINICAL SWALLOW ASSESSMENT     Name: Kevin Obrien   MRN: [id number]     DOB: February 28, 1951      Age: 73 y.o.  Admission Date: 11/21/2023     LOS: 5 days     Date of Service: 11/26/2023        Evaluation Summary   Clinical swallow evaluation completed.   Pt well known to this department w/ his most recently completed videoswallow study on 11/07/2023 w/ moderate-severe oropharyngeal dysphagia observed.  Clinical swallow eval this date indicates presence of severe dysphagia this date.  Anticipate pt's current mental status further impacting swallow this date.  See below for details.  Clinical Impression: Severe dysphagia  Sources of Dysphagia:  impaired motor programming, weakness, pt's AMS all c/w pt's recent brain injury and current medical status  Discussed pt w/ RN and messaged Dr. Thedora re: rec pt remain NPO (at time of eval diet orders in pt's chart for FLD)     Recommendations  Swallow Recommendations  NPO: Continue long term non-oral nutrition  Medications:  (PEG)  Oral Hygiene: 4 times per day, Complete oral care to minimize the risk of aspirating oral bacteria  Ongoing dysphagia tx.    PO Presentation  Presentations: Therapist Fed  Thin Liquid: 1 Tsp  Other Consistencies: Puree, Ice chips    Clinical Interpretation of Oral Stage  Withdraw Bolus:  (very slowed labial seal for w/draw)  Form Bolus: Slowed, Absent formation of bolus (absent w/ puree- oral care completed; consistent lingual pumping w/ limited p.o. presentations)  Masticate Bolus: Did not trial masticated solids due to safety concerns (Comment)  Transfer Bolus: Suspect early spillover  Anterior Bolus Spillage: On left (trace w/ thin water )  Oral Residue: None    Clinical Interpretation of Pharyngeal Stage  Swallow Initiation:  (present)  Laryngeal Elevation:  (present)  Pharyngeal Stage Summary: The pt tolerated conservative p.o. presentations free of overt s/s aspiration.  Highly suspect presence of silent aspiration. Swallow initiation consistently suspected to be delayed (up to 20 seconds w/ ice chips & water ) & not apprecated w/ single bolus of puree (oral care completed but bolus not present in oral cavity- anticipate spillover to pharynx).    Oral Mech Exam  Oral Mech WFL: No  Oral Mech Exam Summary: Given max cues pt did not participate in oral mech examination w/ exception of limited jaw ROM when asked to open mouth widely.  Upper dentition present.    Attempted Swallow Strategies  Small Bites/Sips: Not effective    Objective  Reason for Admission and Past Medical Hx: [name] is a 73 y.o. male and has a past medical history of Accidental fall, Angina pectoris, Arthritis, BPH (benign prostatic hyperplasia), Cataract, Constipation, Coronary artery disease, DM (diabetes mellitus) (CMS-HCC), ED (erectile dysfunction), Heart attack (CMS-HCC), Hyperlipidemia, Hypertension, Joint pain, Neuropathy, and Shingles. The patient presented to [facility] on 10/17/2023 with left gaze deviation, facial weakness and dysarthria. CT head was negative for acute changes. CTA head and neck without LVO. Patient was not a TNK or EVT candidate. 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 Neurology recommend DAPT with aspirin  and Plavix . He is currently on permissive hypertension. The patient's hospital course is functionally complicated by impaired mobility and ADLs.  Significant Hospital Events: PMH of BPH, diabetes mellitus, HTN, HLD, CAD s/p PCI, Acute right pontomedullary infarct admitted to [facility] 5/19, transferred now for hematochezia.    11/07/23 VSS  Penetration Events: Inconsistent penetration of varying  depths for thin/mildly thick liquids during the swallow  Aspiration Events: Thin liquids via cup before/during the swallow that was silent in nature; cued cough ineffective for clearing aspirated material  Pharyngeal Residue: Mild-moderate vallecular/pyriform sinus; amount inconsistently increased with viscosity (occasionally able to complete dry swallow to reduce residual amount)  Esophageal Observations: Consideration of reduced UES opening  Strategies: Controlled volumes (5cc and 10cc) appeared to improved bolus withdraw however did not fully eliminate penetration/aspiration; straw inconsistent with improved efficiency/coordination of AP transit; attempted chin tuck as pt occasionally to naturally demonstrate but unsuccessful completion  Sources of Dysphagia: Weakness from recent CVA; impaired motor processing + incoordination; oral weakness s/p CVA + dx of Bell's Palsy    Special Considerations: Feeding tube present    Imaging  CT HEAD WO CONTRAST  Result Date: 11/26/2023  1. Hypodensity lower pons/medulla compatible with late subacute infarct present acutely on prior MRI Oct 29, 2023. 2. Old left basal ganglia/corona radiata infarct. 3. Small right periventricular lacunar infarct. 4. Mild cerebral volume loss. 5. Deep white matter hypodensity consistent with microvascular gliosis. 6. Arterial calcification.  Finalized by Arlyss Ruth, M.D. on 11/26/2023 7:07 AM. Dictated by Arlyss Ruth, M.D. on 11/26/2023 6:59 AM.    Nutrition  Current Form Of Nutrition: NPO, PEG    Education  Persons Educated: Patient  Barriers To Learning: Cognitive Deficits, Impaired Communication  Interventions: Staff Educated  Teaching Methods: Verbal  Topics: Dysphagia  Patient Response: Unable to Verbalize Understanding  Goal Formulation: Patient Unable to participate in Goal Setting    Assessment/Prognosis  Plan: 3-5 x/week  Prognosis: Poor  NOMS Dysphagia Rating: 2-Moderately-Severe Dysphagia -Not able to swallow safely by mouth for nutrition/hydration but may take some consistency w/ consistent max cues in therapy only. Alternative method of feeding required.    Clinical Swallow Goals  Goal : The pt will initiate a pharyngeal swallow w/ p.o. trials 7/10 trials w/ use of sensory stimulation (carbonated, cold, sour).  Goal : The pt will participate in ongoing swallow eval given consistent cues to maintain attention.    Therapist:Larell Baney Millard-Harsche, MA, L/CCC-SLP, BCS-S         Voalte: [id number]  Date:11/26/2023

## 2023-11-26 NOTE — Case Management (ED)
 Case Management Progress Note    NAME:Kevin Obrien                          MRN: [id number]              DOB:April 09, 1951          AGE: 73 y.o.  ADMISSION DATE: 11/21/2023             DAYS ADMITTED: LOS: 5 days      Today's Date: 11/26/2023    PLAN: Discharge to SNF once accepted and insurance authorizes    Expected Discharge Date: 11/27/2023   Is Patient Medically Stable: Yes   Are there Barriers to Discharge? no    INTERVENTION/DISPOSITION:  Pt denied by [name] due to mental status/behaviors.  Pt denied by [facility] because they no longer have a contract with his insurance.  Updated pt's dtr [name].  She will review list of facilities this evening and provide SW with more facility choices.      Discharge Planning                 Transportation                 Support                 Info or Referral                 Positive SDOH Domains and Potential Barriers                   Medication Needs                                                                                                                                                         Financial                 Legal                 Other                 Discharge Disposition  Kevin Obrien  Available on Voalte  [phone]

## 2023-11-26 NOTE — Progress Notes
 General Progress Note    Name:  Kevin Obrien   Today's Date:  11/26/2023  Admission Date: 11/21/2023  LOS: 5 days                     Assessment/Plan:    Principal Problem:    Hematochezia  Active Problems:    Adjustment disorder with mixed anxiety and depressed mood    Severe malnutrition    Diverticular hemorrhage    This is a  73 y.o. male with past medical history of BPH, diabetes mellitus, hypertension, hyperlipidemia, coronary artery disease status post PCI, Acute right pontomedullary infarct admitted to rehab, transferred now for hematochezia.      Hematochezia -improving  Diverticulosis  - Has bright red blood in stools   - HB on 6/12 AM: 11  - Iron studies 11/18/2023: Iron 104, % saturation 26, TIBC 398, ferritin 208, transferrin 267  - Was currently on aspirin  and Plavix  for recent stroke  -EGD unremarkable.  Colonoscopy showed diverticulosis but no active bleeding  Plan:  - Gastroenterology consulted.   - Hold Plavix  for now  - Stopping Protonix    - Blood transfusion consent obtained       UTI - present on admission  S/p Foley cath -was placed on admission arrival  Urinary retention periodically since stroke  - Urine culture from 11/17/2023: >100,000 E coli resistant to Bactrim   - Status post 5 days ceftriaxone   -Foley catheter removal and trial with as needed bladder scan and straight cathing 6/15     Hypertension-now hypotensive  - Hold PTA amlodipine  5 mg, propranolol  10 mg twice daily with parameters     CAD status post PCI  - Continue PTA aspirin        Recent right pontomedullary CVA  Expressive aphasia  - Hold PTA Plavix  as above  - Continue PTA aspirin , atorvastatin   - PT/OT   -Psychiatric consultation for some agitation   Recommendations:  Continue mirtazapine  15 mg QHS for mood, sleep, and appetite   Change trazodone  25 mg BID PRN for agitation  Will schedule trazodone  25 mg QHS for insomnia  Patient denies SI and does not have a plan or intention of harming himself or ending his own life. Continue telesitter per nursing discretion in the setting of impulsivity and elopement risk.   Neurorehabilitation Psychology consulted. Agree with and appreciate recommendations.  Psychiatry will continue to follow  -Speech therapy to evaluate swallowing -strict n.p.o. given aspiration risk    Acute on subacute encephalopathy -family feels is more confused and weaker in bilateral upper extremity since last week, repeat CT head no acute changes and showed evolution of prior recent stroke.  Patient appears to be back to baseline.          Type 2 diabetes  - Continue PTA Lantus  17 units  - LDCF     Hyperlipidemia  - Continue PTA rosuvastatin  40 mg           Diet: NPO  DVT PPx: Holding in setting of GI bleed  Code Status: Full code  Disposition : Need to be working on placement likely in a few days to SNF    Spent more than caring for this patient today.     Nutrition: Dietitian Documentation ICD-10 code E43: Acute illness/Severe malnutrition    Weight loss: Greater than 5% x 1 month, Moderate loss of muscle mass, Moderate loss of body fat      Loss of Subcutaneous Fat: Yes Moderate  Orbital, Triceps  Muscle Wasting: Yes Moderate Clavicle, Temple         Malnutrition Interventions: Nutrition needs provided by EN  Wound: Wound Documentation             ________________________________________________________________________    Subjective  Kevin Obrien is a 73 y.o. male.  No acute overnight event.          Medications  Scheduled Meds:[Held by Provider] amLODIPine  (NORVASC ) tablet 5 mg, 5 mg, Per NG tube, QDAY  aspirin  chewable tablet 81 mg, 81 mg, Per NG tube, QDAY  Diet Enteral Feeding Bolus, , SEE ADMIN INSTRUCTIONS, QID (1,87,83,79)   And  Water  Bolus, 100 mL, PEG Tube, Q4H  insulin  aspart (U-100) (NOVOLOG  FLEXPEN U-100 INSULIN ) injection PEN 0-6 Units, 0-6 Units, Subcutaneous, ACHS (22)  insulin  glargine (LANTUS  SOLOSTAR U-100 INSULIN ) injection PEN 17 Units, 17 Units, Subcutaneous, QHS(22)  mirtazapine  (REMERON ) tablet 15 mg, 15 mg, Oral, QHS  petrolatum  (STYE) ophthalmic ointment 0.25 inch, 0.25 inch, Both Eyes, TID  phenylephrine -cocoa butter (PREPARATION H(phenyleph,cocoa buttr)) hemorrhoidal prep rectal suppository 1 suppository, 1 suppository, Rectal, BID  propranoloL  (INDERAL ) tablet 10 mg, 10 mg, Per NG tube, BID  rosuvastatin  (CRESTOR ) tablet 40 mg, 40 mg, Oral, QDAY  thiamine  mononitrate (vit B1) tablet 100 mg, 100 mg, Per NG tube, QDAY  trazodone (#) (DESYREL ) suspension 25 mg, 25 mg, Feeding Tube, QHS    Continuous Infusions:      PRN and Respiratory Meds:acetaminophen  Q4H PRN, dextrose  50% (D50) IV PRN, melatonin QHS PRN, OLANZapine  Q8H PRN, pancrelipase  20,880 Units/sodium bicarbonate  650 mg (Gay CLOG DESTROYER) PRN (On Call from Rx), phenoL PRN, sennosides-docusate sodium  QDAY PRN, trazodone (#) BID PRN        Objective:                          Vital Signs: Last Filed                 Vital Signs: 24 Hour Range   BP: 155/75 (06/17 1425)  Temp: 36.5 ?C (97.7 ?F) (06/17 1425)  Pulse: 105 (06/17 1425)  Respirations: 20 PER MINUTE (06/17 1425)  SpO2: 100 % (06/17 1425)  O2 Device: None (Room air) (06/17 1425) BP: (126-155)/(50-75)   Temp:  [36.3 ?C (97.4 ?F)-38.5 ?C (101.3 ?F)]   Pulse:  [76-114]   Respirations:  [18 PER MINUTE-20 PER MINUTE]   SpO2:  [96 %-100 %]   O2 Device: None (Room air)     Vitals:    11/21/23 2323 11/22/23 1318   Weight: 59.3 kg (130 lb 11.7 oz) 59 kg (130 lb)       Intake/Output Summary:  (Last 24 hours)    Intake/Output Summary (Last 24 hours) at 11/26/2023 1500  Last data filed at 11/26/2023 1209  Gross per 24 hour   Intake 2120 ml   Output 830 ml   Net 1290 ml           Physical Exam    General Appearance: Weak and ill-appearing and cachectic  Head: NC/AT  Lungs: No respiratory distress on room air  Heart: RRR,  Abdomen: Rectal tube having frank blood in it, Foley catheter in place with clear urine  Neurologic: Disoriented and not following commands      Lab Review  Pertinent labs reviewed    Point of Care Testing  (Last 24 hours)  POC Glucose (Download): (!) 272 (11/26/23 1143)    Radiology and other Diagnostics Review:  Pertinent radiology reviewed.    Jaymison Luber, MD   Pager

## 2023-11-27 LAB — SURGICAL PATHOLOGY

## 2023-11-27 LAB — POC GLUCOSE
~~LOC~~ BKR POC GLUCOSE: 265 mg/dL — ABNORMAL HIGH (ref 70–100)
~~LOC~~ BKR POC GLUCOSE: 175 mg/dL — ABNORMAL HIGH (ref 70–100)
~~LOC~~ BKR POC GLUCOSE: 357 mg/dL — ABNORMAL HIGH (ref 70–100)
~~LOC~~ BKR POC GLUCOSE: 469 mg/dL — ABNORMAL HIGH (ref 70–100)
~~LOC~~ BKR POC GLUCOSE: 410 mg/dL — ABNORMAL HIGH (ref 70–100)

## 2023-11-27 MED ORDER — INSULIN ASPART 100 UNIT/ML SC FLEXPEN
0-12 [IU] | Freq: Before meals | SUBCUTANEOUS | 0 refills | Status: DC
Start: 2023-11-27 — End: 2023-11-29
  Administered 2023-11-27: 21:00:00 12 [IU] via SUBCUTANEOUS

## 2023-11-27 MED ORDER — DEXTROSE 50 % IN WATER (D50W) IV SYRG
12.5-25 g | INTRAVENOUS | 0 refills | Status: DC | PRN
Start: 2023-11-27 — End: 2023-11-30

## 2023-11-27 MED ORDER — INSULIN ASPART 100 UNIT/ML SC FLEXPEN
0-12 [IU] | Freq: Four times a day (QID) | SUBCUTANEOUS | 0 refills | Status: DC
Start: 2023-11-27 — End: 2023-11-27

## 2023-11-27 MED ORDER — INSULIN ASPART 100 UNIT/ML SC FLEXPEN
10 [IU] | Freq: Four times a day (QID) | SUBCUTANEOUS | 0 refills | Status: DC
Start: 2023-11-27 — End: 2023-11-27

## 2023-11-27 MED ORDER — MIRTAZAPINE 15 MG PO TAB
15 mg | Freq: Every evening | GASTROSTOMY | 0 refills | Status: DC
Start: 2023-11-27 — End: 2023-11-30
  Administered 2023-11-28 – 2023-11-29 (×2): 15 mg via GASTROSTOMY

## 2023-11-27 MED ORDER — CLOPIDOGREL 75 MG PO TAB
75 mg | Freq: Every day | ORAL | 0 refills | Status: DC
Start: 2023-11-27 — End: 2023-11-28
  Administered 2023-11-28: 13:00:00 75 mg via ORAL

## 2023-11-27 MED ORDER — DOXAZOSIN 4 MG PO TAB
4 mg | Freq: Every evening | GASTROSTOMY | 0 refills | Status: DC
Start: 2023-11-27 — End: 2023-11-30
  Administered 2023-11-27 – 2023-11-29 (×2): 4 mg via GASTROSTOMY

## 2023-11-27 MED ORDER — INSULIN ASPART 100 UNIT/ML SC FLEXPEN
10 [IU] | Freq: Four times a day (QID) | SUBCUTANEOUS | 0 refills | Status: DC
Start: 2023-11-27 — End: 2023-11-30

## 2023-11-27 MED ORDER — INSULIN ASPART 100 UNIT/ML SC FLEXPEN
0-12 [IU] | Freq: Every day | SUBCUTANEOUS | 0 refills | Status: DC
Start: 2023-11-27 — End: 2023-11-27

## 2023-11-27 MED ORDER — ROSUVASTATIN 20 MG PO TAB
40 mg | Freq: Every day | GASTROSTOMY | 0 refills | Status: DC
Start: 2023-11-27 — End: 2023-11-30
  Administered 2023-11-28 – 2023-11-29 (×2): 40 mg via GASTROSTOMY

## 2023-11-27 MED ORDER — MELATONIN 5 MG PO TAB
5 mg | Freq: Every evening | GASTROSTOMY | 0 refills | Status: DC | PRN
Start: 2023-11-27 — End: 2023-11-30

## 2023-11-27 MED ORDER — SENNOSIDES-DOCUSATE SODIUM 8.6-50 MG PO TAB
1 | Freq: Every day | GASTROSTOMY | 0 refills | Status: DC | PRN
Start: 2023-11-27 — End: 2023-11-30

## 2023-11-27 NOTE — Progress Notes
 OCCUPATIONAL THERAPY  PROGRESS NOTE      Name: Kevin Obrien   MRN: [id number]     DOB: [birth year]      Age: 73 y.o.  Admission Date: 11/21/2023     LOS: 6 days     Date of Service: 11/27/2023      Mobility  Patient Turn/Position: Supine  Mobility Level [facility] Highest Level of Mobility (JH-HLM): Stand (1 or more minutes)  Level of Assistance: Assist X2  Assistive Device: Hand Held  Activity Limited By: Mental Status Variability;Weakness    Subjective  Significant Hospital Events: PMH of BPH, diabetes mellitus, HTN, HLD, CAD s/p PCI, Acute right pontomedullary infarct admitted to [facility] 5/19, transferred now for hematochezia.  Special Considerations: Feeding tube present  Mental / Cognitive: Oriented;To person;Inconsistent with command following;Confused;Lethargic;Tangential (intermittently tearful)  Pain: Complains of pain;Does not rate pain  Pain level: Before activity;During activity;After activity  Pain Location: Posterior;Head  Pain Interventions: Patient assisted into position of comfort;Patient agrees to participate in therapy with modifications to session  Persons Present: Rehabilitation technician;Daughter    Home Living Situation  Lives With: Family  Type of Home: House  Entry Stairs: 2  In-Home Stairs: Able to live on main level  Bathroom Setup: Tub/shower unit  Patient Owned Equipment: Cane: Single point  Comments: Pt denies prior use of DME.    Prior Level of Function  Level Of Independence: Independent with ADL and household mobility with device  Comments: intermittent use of SPC per daughter  History of Falls in Past 3 Months: Yes  Occupation/Education: High school level education;Employed  Comments: Above information gained via EMR. Patient with recent CVA and subsequent discharge to [facility] 10/28/23, now readmitted 6/12.    ADL's  Where Assessed: Supine, Bed;Edge of Bed  LE Dressing Assist: Total Assist  LE Dressing Deficits: Don/Doff R Sock;Don/Doff L Sock  Toileting Assist: Total Assist  Toileting Deficits: Perineal Hygiene;Steadying  Comment: BM noted on chux when standing. Total assist for pericare.    ADL Mobility  Bed Mobility: Supine to Sit: Maximum assist;x2 people  Bed Mobility: Sit to Supine: Maximum assist;x2 people  Transfer Type: Sit to/from stand (x2)  Transfer: Assistance Level: To/from;Bed;Moderate assist;x2 people  Transfer: Assistive Device: Hand hold assist  Transfer: Type of Assistance: For balance;For safety considerations;For strength deficit;Verbal cues  End of Activity Status: In bed;Instructed patient to request assist with mobility;Instructed patient to use call light;Nursing notified  Transfer Comments: Pt sits edge of bed with forward lean. Minimal correction despite verbal cues and physical assist. When standing pt able to take approx 4 side steps to R towards HOB  Sitting Balance: Static sitting balance;Minimal assist;Moderate assist  Standing Balance: Static standing balance;Dynamic standing balance;Minimal assist;Moderate assist;x2 people    Activity Tolerance  Endurance: 3/5 Tolerates 25-30 Minutes Exercise w/Multiple Rests    Cognition  Overall Cognitive Status: Confused  Comprehension: Language Barrier  Problem Solving: Cueing to Sequence Task;Direction Following Assist;Decreased Judgment/Safety  Attention: Awake/Alert    Education  Persons Educated: Patient/Family  Barriers To Learning: Cognitive Deficits;Impaired Communication  Interventions: Repetition of Instructions  Teaching Methods: Verbal Instruction;Demonstration  Patient Response: Verbalized and Demo Understanding;More Instruction Required  Topics: Role of OT, Goals for Therapy  Goal Formulation: With Patient/Family    Assessment  Assessment: Decreased ADL Status;Decreased UE ROM;Decreased UE Strength;Decreased Safe/Judg during ADL;Decreased Cognition;Decreased Endurance;Decreased Self-Care Trans;Decreased High-Level ADLs  Prognosis: Fair;w/Cont OT s/p Acute Discharge  Goal Formulation: Patient    AM-PAC 6 Clicks Daily Activity Inpatient  Putting on  and taking off regular lower body clothes: Total  Bathing (Including washing, rinsing, drying): Total  Toileting, which includes using toilet, bedpan, or urinal: Total  Putting on and taking off regular upper body clothing: A Little  Taking care of personal grooming such as brushing teeth: A Little  Eating meals: Total  Daily Activity Raw Score: 10  Standardized (T-scale) Score: 27.31    Plan  OT Frequency: 3-5x/week  OT Plan for Next Visit: EOB activities, move to recliner, repeated STS    ADL Goals  Patient Will Perform All ADL's: w/ Stand By Assist    Functional Transfer Goals  Pt Will Perform All Functional Transfers: w/ Stand By Assist    OT Discharge Recommendations  Recommendation: Inpatient setting      Therapist: Saddie Jerilynn MEMO, OTR/L [id number]  Date: 11/27/2023

## 2023-11-27 NOTE — Progress Notes
 Psychiatric Consultation Progress Note    LOS: 6 days    Current Psychotropic Meds:   Melatonin 5 mg QHS PRN insomnia  Mirtazapine  15 mg QHS   Olanzapine  5 mg IM Q 8 hours PRN for severe agitation   Trazodone  25 mg QHS   Trazodone  25 mg BID PRN agitation    Psychiatric Assessment:  Adjustment disorder with mixed anxiety and depressed mood     Relevant Medical Problems:  Acute ischemic stroke, right pontomedullary  Expressive aphasia  Hematochezia     Recommendations:  No indication to change to change psychotropic medication regimen today (6/18):  Continue mirtazapine  15 mg PO QHS for mood, sleep, and appetite   Continue trazodone  25 mg PO QHS for insomnia  PRNs for Agitation:  Continue trazodone  25 mg PO BID PRN first-line for agitation  Continue olanzapine  5 mg IM Q 8 hours PRN second-line for severe agitation in which patient is at risk of harming self or others  SW with primary team assisting with discharge planning. Per SW note, patient with discharge to skilled nursing facility once accepted and insurance authorizes. No psychiatric barriers to discharge plan.   Psychiatry will continue to follow.    Delirium recommendations:  - Avoid benzodiazepines, opiates and anticholinergics if possible as can contribute/cause delirium.   - Frequent reorientation, use of clocks/calendars, verbal reminders of current location and situation.   - A calm, comfortable environment that includes familiar objects from home.   - Involvement of family members.   - Uninterrupted periods of sleep at night, with low levels of noise and minimal light.   - Open blinds during the day to promote daytime alertness and a regular sleep-wake cycle    Reviewed EMR.  Reviewed Laboratory studies.       [name] Baseman, DNP, APRN, PMHNP-BC, PMGT-BC  Nurse Practitioner, Psychiatry  Available via Volate    Please feel free to contact us  with any additional questions or concerns by paging the consult team between 8am and 5pm on weekdays and between 8am and 3pm on weekends at [phone]. Otherwise, page the psychiatry resident on call.    --------------------------------------------------------------------------------------------------------------------------------  Subjective:  Kevin Obrien is a 73 year-old male seen at the bedside for follow-up evaluation. Patient is alert and awake, resting in bed. His behavior is calm and cooperative. He is intermittently tearful throughout encounter. Patient endorses that his mood is ok. He verbalizes the he is not experiencing symptoms of anxiety or depression. Patient does acknowledge frustration about his current situation with being hospitalized, needing to depend on others, and not making as much progress as he would like to in terms of his recovery. He slept well overnight. Patient denies SI/HI/AVH.     Per chart review, patient has not required PRN medications for agitation in greater than 48 hours.     Review of Systems   Respiratory:  Negative for cough and shortness of breath.    Cardiovascular:  Negative for chest pain.   Gastrointestinal:  Negative for nausea and vomiting.   Neurological:  Negative for dizziness and headaches.   Psychiatric/Behavioral:  Positive for memory loss. Negative for depression, hallucinations and suicidal ideas. The patient is not nervous/anxious and does not have insomnia.         Objective:                   Vital Signs:  Current                Vital Signs:  24 Hour Range   BP: 161/63 (06/18 0750)  Temp: 36.9 ?C (98.5 ?F) (06/18 0750)  Pulse: 91 (06/18 0750)  Respirations: 20 PER MINUTE (06/18 0750)  SpO2: 97 % (06/18 0750)  O2 Device: None (Room air) (06/18 0750) BP: (126-173)/(50-75)   Temp:  [36.3 ?C (97.4 ?F)-37.1 ?C (98.8 ?F)]   Pulse:  [76-105]   Respirations:  [20 PER MINUTE]   SpO2:  [97 %-100 %]   O2 Device: None (Room air)    Intensity Pain Scale (Self Report): (not recorded)      Scheduled Medications:  [Held by Provider] amLODIPine  (NORVASC ) tablet 5 mg, 5 mg, Per NG tube, QDAY  aspirin  chewable tablet 81 mg, 81 mg, Per NG tube, QDAY  Diet Enteral Feeding Bolus, , SEE ADMIN INSTRUCTIONS, QID (1,87,83,79)   And  Water  Bolus, 100 mL, PEG Tube, Q4H  insulin  aspart (U-100) (NOVOLOG  FLEXPEN U-100 INSULIN ) injection PEN 0-6 Units, 0-6 Units, Subcutaneous, ACHS (22)  insulin  glargine (LANTUS  SOLOSTAR U-100 INSULIN ) injection PEN 17 Units, 17 Units, Subcutaneous, QHS(22)  mirtazapine  (REMERON ) tablet 15 mg, 15 mg, Oral, QHS  petrolatum  (STYE) ophthalmic ointment 0.25 inch, 0.25 inch, Both Eyes, TID  phenylephrine -cocoa butter (PREPARATION H(phenyleph,cocoa buttr)) hemorrhoidal prep rectal suppository 1 suppository, 1 suppository, Rectal, BID  propranoloL  (INDERAL ) tablet 10 mg, 10 mg, Per NG tube, BID  rosuvastatin  (CRESTOR ) tablet 40 mg, 40 mg, Oral, QDAY  thiamine  mononitrate (vit B1) tablet 100 mg, 100 mg, Per NG tube, QDAY  trazodone (#) (DESYREL ) suspension 25 mg, 25 mg, Feeding Tube, QHS        PRN Medications:  acetaminophen  Q4H PRN 650 mg at 11/26/23 2115, dextrose  50% (D50) IV PRN, melatonin QHS PRN 5 mg at 11/25/23 0348, OLANZapine  Q8H PRN 5 mg at 11/23/23 1650, pancrelipase  20,880 Units/sodium bicarbonate  650 mg ([place] CLOG DESTROYER) PRN (On Call from Rx), phenoL PRN, sennosides-docusate sodium  QDAY PRN, trazodone (#) BID PRN 25 mg at 11/22/23 2218      Mental Status Exam:  General/Constitutional: appears stated age, good hygiene, dressed in personal attire  Speech/Motor: dysarthric, quiet volume  Mood/Affect:  good/restricted, mood incongruent   Thought Process: Limited communication  Thought Content: Denies SI/HI  Perception: Denies AVH, not observed to respond to internal stimuli  Insight/Judgment: Limited/limited     Orientation: Oriented to person only  Recent and Remote Memory: Limited recall  Attention span and concentration: Limited  Fund of knowledge and vocabulary: expressive aphasia     Focused Physical Exam:  Musculoskeletal: no obvious MSK abnormalities  Neuro: Bilateral gaze palsy, left facial weakness    [name] Baseman, DNP, APRN, PMHNP-BC, PMGT-BC  Nurse Practitioner, Psychiatry  Available via Volate    Total Time Today was 35 minutes in the following activities: Preparing to see the patient, Obtaining and/or reviewing separately obtained history, Performing a medically appropriate examination and/or evaluation, Counseling and educating the patient/family/caregiver, Documenting clinical information in the electronic or other health record, Independently interpreting results (not separately reported) and communicating results to the patient/family/caregiver, and Care coordination (not separately reported)

## 2023-11-27 NOTE — Case Management (ED)
 Case Management Progress Note    NAME:Kevin Obrien                          MRN: [id number]              DOB:11-15-1950          AGE: 73 y.o.  ADMISSION DATE: 11/21/2023             DAYS ADMITTED: LOS: 6 days      Today's Date: 11/27/2023    PLAN: Discharge to SNF    Expected Discharge Date: 11/28/2023   Is Patient Medically Stable: Yes   Are there Barriers to Discharge? no    INTERVENTION/DISPOSITION:  [name], [name], requested SW to additional facility referrals to the following: [facility], [facility], [facility]  Referrals sent.  Pt accepted at [facility]  Additionally, [facility] changed their minds and are also willing to accept him at their [place] location now as well.  Family deciding which location they want him to go to.      Discharge Planning                 Transportation                 Support                 Info or Referral                 Positive SDOH Domains and Potential Barriers                   Medication Needs                                                                                                                                                         Financial                 Legal                 Other                 Discharge Disposition  Tully Dutch  Available on Voalte  [phone]

## 2023-11-28 LAB — BASIC METABOLIC PANEL
~~LOC~~ BKR ANION GAP: 12 (ref 3–12)
~~LOC~~ BKR BLD UREA NITROGEN: 28 mg/dL — ABNORMAL HIGH (ref 7–25)
~~LOC~~ BKR CALCIUM: 9.6 mg/dL (ref 8.5–10.6)
~~LOC~~ BKR CHLORIDE: 102 mmol/L (ref 98–110)
~~LOC~~ BKR CO2: 26 mmol/L (ref 21–30)
~~LOC~~ BKR CREATININE: 1 mg/dL (ref 0.40–1.24)
~~LOC~~ BKR GLOMERULAR FILTRATION RATE (GFR): >60 mL/min (ref >60)
~~LOC~~ BKR GLUCOSE, RANDOM: 424 mg/dL — ABNORMAL HIGH (ref 70–100)
~~LOC~~ BKR POTASSIUM: 4.1 mmol/L (ref 3.5–5.1)
~~LOC~~ BKR SODIUM, SERUM: 140 mmol/L (ref 137–147)

## 2023-11-28 LAB — POC GLUCOSE
~~LOC~~ BKR POC GLUCOSE: 304 mg/dL — ABNORMAL HIGH (ref 70–100)
~~LOC~~ BKR POC GLUCOSE: 331 mg/dL — ABNORMAL HIGH (ref 70–100)
~~LOC~~ BKR POC GLUCOSE: 249 mg/dL — ABNORMAL HIGH (ref 70–100)
~~LOC~~ BKR POC GLUCOSE: 331 mg/dL — ABNORMAL HIGH (ref 70–100)
~~LOC~~ BKR POC GLUCOSE: 356 mg/dL — ABNORMAL HIGH (ref 70–100)

## 2023-11-28 MED ORDER — CLOPIDOGREL 75 MG PO TAB
75 mg | Freq: Every day | GASTROSTOMY | 0 refills | Status: DC
Start: 2023-11-28 — End: 2023-11-30
  Administered 2023-11-29: 14:00:00 75 mg via GASTROSTOMY

## 2023-11-28 MED ORDER — MODAFINIL 100 MG PO TAB
200 mg | Freq: Every day | GASTROSTOMY | 0 refills | Status: DC
Start: 2023-11-28 — End: 2023-11-30
  Administered 2023-11-29: 14:00:00 200 mg via GASTROSTOMY

## 2023-11-28 MED ORDER — MODAFINIL 100 MG PO TAB
100 mg | Freq: Every day | GASTROSTOMY | 0 refills | Status: DC
Start: 2023-11-28 — End: 2023-11-30
  Administered 2023-11-28 – 2023-11-29 (×2): 100 mg via GASTROSTOMY

## 2023-11-28 MED ORDER — MODAFINIL 200 MG PO TAB
200 mg | Freq: Every day | ORAL | 0 refills | Status: DC
Start: 2023-11-28 — End: 2023-11-28

## 2023-11-28 MED ORDER — DEXTROSE 50 % IN WATER (D50W) IV SYRG
12.5-25 g | INTRAVENOUS | 0 refills | Status: DC | PRN
Start: 2023-11-28 — End: 2023-11-30

## 2023-11-28 MED ORDER — MODAFINIL 100 MG PO TAB
100 mg | Freq: Every day | ORAL | 0 refills | Status: DC
Start: 2023-11-28 — End: 2023-11-28

## 2023-11-28 MED ORDER — INSULIN ASPART 100 UNIT/ML SC FLEXPEN
0-24 [IU] | Freq: Before meals | SUBCUTANEOUS | 0 refills | Status: DC
Start: 2023-11-28 — End: 2023-11-30
  Administered 2023-11-29: 07:00:00 12 [IU] via SUBCUTANEOUS
  Administered 2023-11-29: 21:00:00 24 [IU] via SUBCUTANEOUS

## 2023-11-28 NOTE — Progress Notes
 General Progress Note    Name:  Kevin Obrien   Today's Date:  11/28/2023  Admission Date: 11/21/2023  LOS: 7 days                     Assessment/Plan:    Principal Problem:    Hematochezia  Active Problems:    Adjustment disorder with mixed anxiety and depressed mood    Severe malnutrition    Diverticular hemorrhage    This is a  73 y.o. male with past medical history of BPH, diabetes mellitus, hypertension, hyperlipidemia, coronary artery disease status post PCI, Acute right pontomedullary infarct admitted to rehab, transferred now for hematochezia.      Hematochezia -resolved  Diverticulosis   - Has bright red blood in stools   - HB on 6/12 AM: 11  - Iron studies 11/18/2023: Iron 104, % saturation 26, TIBC 398, ferritin 208, transferrin 267  - Was currently on aspirin  and Plavix  for recent stroke  -EGD unremarkable.  Colonoscopy showed diverticulosis but no active bleeding  Plan:  - Gastroenterology consulted.   - ok to resume Plavix  after discussion with GI 6/19  -If he has rebleed can consider to switch to single agent only   - Stopping Protonix    - Blood transfusion consent obtained       UTI - present on admission s/p tx  S/p Foley cath -was placed on admission arrival  Urinary retention periodically since stroke  - Urine culture from 11/17/2023: >100,000 E coli resistant to Bactrim   - Status post 5 days ceftriaxone   -Foley catheter removal and trial with as needed bladder scan and straight cathing 6/15     Hypertension-now hypotensive  - Hold PTA amlodipine  5 mg, propranolol  10 mg twice daily with parameters     CAD status post PCI  - Continue PTA aspirin        Recent right pontomedullary CVA  Expressive aphasia  Poststroke sleepiness during the day  Poststroke encephalopathy  - Hold PTA Plavix  as above  - Continue PTA aspirin , atorvastatin   - PT/OT   -Psychiatric consultation for some agitation   Recommendations:  Continue mirtazapine  15 mg QHS for mood, sleep, and appetite   Change trazodone  25 mg BID PRN for agitation  Will schedule trazodone  25 mg QHS for insomnia  Patient denies SI and does not have a plan or intention of harming himself or ending his own life. Continue telesitter per nursing discretion in the setting of impulsivity and elopement risk.   Neurorehabilitation Psychology consulted. Agree with and appreciate recommendations.  Psychiatry will continue to follow  -Speech therapy to evaluate swallowing -strict n.p.o. given aspiration risk  - Restarting PTA modafinil  which was started 200 mg in the morning and 100 mg in the afternoons to help alertness during the day    Acute on subacute encephalopathy -family feels is more confused and weaker in bilateral upper extremity since last week, repeat CT head no acute changes and showed evolution of prior recent stroke.  Patient appears to be back to baseline.          Type 2 diabetes  - Continue PTA Lantus  17 units  - LDCF     Hyperlipidemia  - Continue PTA rosuvastatin  40 mg           Diet: NPO  DVT PPx: Holding in setting of GI bleed  Code Status: Full code  Disposition : Need to be working on placement likely in a few days to SNF  Spent more than caring for this patient today.     Nutrition: Dietitian Documentation ICD-10 code E43: Acute illness/Severe malnutrition    Weight loss: Greater than 5% x 1 month, Moderate loss of muscle mass, Moderate loss of body fat      Loss of Subcutaneous Fat: Yes Moderate Orbital, Triceps  Muscle Wasting: Yes Moderate Clavicle, Temple         Malnutrition Interventions: Nutrition needs provided by EN  Wound: Wound Documentation             ________________________________________________________________________    Subjective  Kevin Obrien is a 73 y.o. male.  No acute overnight event.          Medications  Scheduled Meds:amLODIPine  (NORVASC ) tablet 5 mg, 5 mg, Per NG tube, QDAY  aspirin  chewable tablet 81 mg, 81 mg, Per NG tube, QDAY  [START ON 11/29/2023] clopiDOGreL  (PLAVIX ) tablet 75 mg, 75 mg, Per G Tube, QDAY  Diet Enteral Feeding Bolus, , SEE ADMIN INSTRUCTIONS, QID (1,87,83,79)   And  Water  Bolus, 100 mL, PEG Tube, Q4H  doxazosin  (CARDURA ) tablet 4 mg, 4 mg, Feeding Tube, QHS  insulin  aspart (U-100) (NOVOLOG  FLEXPEN U-100 INSULIN ) injection PEN 0-12 Units, 0-12 Units, Subcutaneous, ACHS (22)  insulin  aspart (U-100) (NOVOLOG  FLEXPEN U-100 INSULIN ) injection PEN 10 Units, 10 Units, Subcutaneous, QID  insulin  glargine (LANTUS  SOLOSTAR U-100 INSULIN ) injection PEN 17 Units, 17 Units, Subcutaneous, QHS(22)  mirtazapine  (REMERON ) tablet 15 mg, 15 mg, PEG Tube, QHS  petrolatum  (STYE) ophthalmic ointment 0.25 inch, 0.25 inch, Both Eyes, TID  propranoloL  (INDERAL ) tablet 10 mg, 10 mg, Per NG tube, BID  rosuvastatin  (CRESTOR ) tablet 40 mg, 40 mg, Per G Tube, QDAY  thiamine  mononitrate (vit B1) tablet 100 mg, 100 mg, Per NG tube, QDAY  trazodone (#) (DESYREL ) suspension 25 mg, 25 mg, Feeding Tube, QHS    Continuous Infusions:      PRN and Respiratory Meds:acetaminophen  Q4H PRN, dextrose  50% (D50) IV PRN, melatonin QHS PRN, pancrelipase  20,880 Units/sodium bicarbonate  650 mg ([place] CLOG DESTROYER) PRN (On Call from Rx), phenoL PRN, sennosides-docusate sodium  QDAY PRN, trazodone (#) BID PRN        Objective:                          Vital Signs: Last Filed                 Vital Signs: 24 Hour Range   BP: 127/46 (06/19 1105)  Temp: 37.1 ?C (98.8 ?F) (06/19 1105)  Pulse: 95 (06/19 1105)  Respirations: 18 PER MINUTE (06/19 1105)  SpO2: 95 % (06/19 1105)  O2 Device: None (Room air) (06/19 1105) BP: (120-151)/(45-69)   Temp:  [36.3 ?C (97.3 ?F)-37.4 ?C (99.4 ?F)]   Pulse:  [85-110]   Respirations:  [18 PER MINUTE]   SpO2:  [94 %-97 %]   O2 Device: None (Room air)   PAINAD Total Score: 0 (11/28/23 0826) Vitals:    11/21/23 2323 11/22/23 1318   Weight: 59.3 kg (130 lb 11.7 oz) 59 kg (130 lb)       Intake/Output Summary:  (Last 24 hours)    Intake/Output Summary (Last 24 hours) at 11/28/2023 1453  Last data filed at 11/28/2023 1200  Gross per 24 hour   Intake 1890 ml   Output 600 ml   Net 1290 ml           Physical Exam    General Appearance: Weak and ill-appearing and  cachectic  Head: NC/AT  Lungs: No respiratory distress on room air  Heart: RRR,  Abdomen: Rectal tube having frank blood in it, Foley catheter in place with clear urine  Neurologic: Disoriented and not following commands      Lab Review  Pertinent labs reviewed    Point of Care Testing  (Last 24 hours)  Glucose: (!) 424 (11/28/23 1324)  POC Glucose (Download): (!) 356 (11/28/23 1103)    Radiology and other Diagnostics Review:    Pertinent radiology reviewed.    Anhelica Fowers, MD   Pager

## 2023-11-28 NOTE — Case Management (ED)
 Case Management Progress Note    NAME:Kevin Obrien                          MRN: [id number]              DOB:[birth year]          AGE: 73 y.o.  ADMISSION DATE: 11/21/2023             DAYS ADMITTED: LOS: 7 days      Today's Date: 11/28/2023    PLAN: Discharge to [facility] once insurance auth is received    Expected Discharge Date: 11/29/2023   Is Patient Medically Stable: Yes   Are there Barriers to Discharge? no    INTERVENTION/DISPOSITION:  Discussed with pt's dtr, [name], and family has chosen for pt to go to [facility] at discharge.  Updated [facility] liaison, Franky, and he started insurance auth.      Discharge Planning                 Transportation                 Support                 Info or Referral                 Positive SDOH Domains and Potential Barriers                   Medication Needs                                                                                                                                                         Financial                 Legal                 Other                 Discharge Disposition  Selected Continued Care - Admitted Since 11/21/2023       [place] Destination Coordination complete.      Service Provider Services Address Phone Fax Patient Preferred    [name] [address] [facility] [address], [place] [address] [phone] --                      Tully Dutch  Available on [place]  [phone]

## 2023-11-28 NOTE — Care Plan
 Problem: Discharge Planning  Goal: Participation in plan of care  Outcome: Goal Ongoing  Goal: Knowledge regarding plan of care  Outcome: Goal Ongoing  Goal: Prepared for discharge  Outcome: Goal Ongoing     Problem: Glucose Management  Goal: Absence of hyperglycemia  Outcome: Goal Ongoing  Goal: Absence of Hypoglycemia  Outcome: Goal Ongoing  Goal: Glucose level within specified parameters  Outcome: Goal Ongoing     Problem: Neurological Status, Impaired/Altered  Goal: Progress toward maximizing functional outcomes  Outcome: Goal Ongoing  Goal: Cognitive status restored to baseline  Outcome: Goal Ongoing     Problem: Skin Integrity  Goal: Skin integrity intact  Outcome: Goal Ongoing  Goal: Healing of skin (Wound & Incision)  Outcome: Goal Ongoing  Goal: Healing of skin (Pressure Injury)  Outcome: Goal Ongoing     Problem: Injury-Risk of, Non-Violent Physical Restraints  Goal: Absence of Injury while physically restrained (Non-Violent)  Outcome: Goal Ongoing     Problem: Nutrition Deficit  Goal: Adequate nutritional intake  Outcome: Goal Ongoing

## 2023-11-28 NOTE — Progress Notes
 SPEECH-LANGUAGE PATHOLOGY  DAILY TREATMENT NOTE      Patient seen 1x this date.  Documentation reflects all daily treatment sessions.    SUMMARY OF THERAPY SESSION:   Dysphagia therapy completed.    Severe oropharyngeal dysphagia persist.  Sources of dysphagia:  impaired motor programming, weakness, pt's AMS all c/w pt's recent brain injury and current medical status   Discussed pt w/ RN who communicated he reported to physician pt more obtunded this date.  Of note, this dept will complete a cognitive-communication eval w/ pt in upcoming sessions as mental status improves.     Recommendations  Swallow Recommendations  NPO: Continue long term non-oral nutrition  Medications:  (PEG)  Oral Hygiene: 4 times per day, Complete oral care to minimize the risk of aspirating oral bacteria  Ongoing dysphagia tx.  Goal : The pt will initiate a pharyngeal swallow w/ p.o. trials 7/10 trials w/ use of sensory stimulation (carbonated, cold, sour).  Met  Comment: Pt initiated a pharyngeal swallow w/ 8/8 p.o. presentations.    PO Presentation  Presentations: Therapist Fed  Thin Liquid: Tsp  Other Consistencies: not appropriate for additional consistencies     Clinical Interpretation of Oral Stage  Withdraw Bolus:  significantly decreased jaw & labial opening   Form Bolus: slowed, repetitive, delayed initiation  Masticate Bolus: Did not trial masticated solids due to safety concerns   Transfer Bolus: Suspect early spillover; repetitive tongue motion  Anterior Bolus Spillage: significant amount of bolus lost anteriorly even w/ PT holding pts head up  Oral Residue: None     Clinical Interpretation of Pharyngeal Stage  Swallow Initiation:  (present)- w/ lemon ice tsp presentations up to 25 seconds; swallow intiation timing appeared improved w/ tsp presentations of carbonated liquid 8-12 seconds  Laryngeal Elevation:  (present)  Pharyngeal Stage Summary: The pt tolerated conservative p.o. presentations free of overt s/s aspiration, however, secondary to severity of impairments listed above highly anticipate presence of aspiration.  Additionally, pt w/ h/o completing a videoswallow study on 5/29 which identified silent aspiration.  Continue to address this goal to ensure accuracy    Goal : The pt will participate in ongoing swallow eval given consistent cues to maintain attention.  Not addressed  Comment:   Continue to address this goal    Goal: The pt will participate in a cognitive-communication eval given consistent cues to maintain attention.  Will address in upcoming sessions.  Given max cues pt only followed 2/13 one step commands; no phonation initiated; made no attempt to verbally communicate; shook head yes x2 in response to questions    PLAN / RECOMMENDATIONS:  Continue treatment 2-3x/week and Patient would benefit from further speech therapy post acute hospitalization.     Therapist: Arlean Bolls, MA, L/CCC-SLP, BCS-S         Voalte: [phone]  Date: 11/28/2023

## 2023-11-28 NOTE — Progress Notes
 PHYSICAL THERAPY  PROGRESS NOTE          Name: Kevin Obrien   MRN: [id number]     DOB: May 12, 1951      Age: 73 y.o.  Admission Date: 11/21/2023     LOS: 7 days     Date of Service: 11/28/2023    Mobility  Patient Turn/Position: Supine  Mobility Level [facility] Highest Level of Mobility (JH-HLM): Sit edge on of bed  Level of Assistance: Assist X2  Assistive Device: Friction reducing slide sheet  Activity Limited By: Mental Status Variability;Weakness    Subjective  Significant Hospital Events: PMH of BPH, diabetes mellitus, HTN, HLD, CAD s/p PCI, Acute right pontomedullary infarct admitted to [facility] 5/19, transferred now for hematochezia.  Mental / Cognitive: Inconsistent with command following;Lethargic;Arousable (Briefly opens eyes)  Pain: Demonstrates no signs of pain  Persons Present: Rehabilitation technician;Speech Therapist    Home Living Situation  Lives With: Family  Type of Home: House  Entry Stairs: 2  In-Home Stairs: Able to live on main level  Bathroom Setup: Tub/shower unit  Patient Owned Equipment: Cane: Single point  Comments: Pt denies prior use of DME.    Prior Level of Function  Level Of Independence: Independent with ADL and household mobility with device  Comments: intermittent use of SPC per daughter  History of Falls in Past 3 Months: Yes  Occupation/Education: High school level education;Employed  Comments: Above information gained via EMR. Patient with recent CVA and subsequent discharge to [facility] 10/28/23, now readmitted 6/12.    Sensation/Tone/Coordination  Head Control: Able to Assist With Head/Neck Control  Posture: Rounded shoulders;Kyphosis;Flexed trunk;Forward head    Bed Mobility/Transfer  Bed Mobility: Supine to Sit: Maximum Assist;x2 People  Bed Mobility: Sit to Supine: Maximum Assist;x2 People  Comments: Pt able to sit edge of bed for ~33minutes. SLP present and evaluating swallow, pt occasionally improved alertness and able to control head better.    Balance  Sitting Balance: Static Sitting Balance;Maximal Assist    Activity/Exercise  Sit Edge Of Bed: 5 minutes  Sit Edge Of Bed Assist: Maximum Assist    Assessment/Progress  Impaired Mobility Due To: Cognitive Deficits;Safety Concerns;Decreased Activity Tolerance;Decreased Level of Alertness;Deconditioning;Medical Status Limitation;Decreased Strength  Impaired Strength Due To: Medical Status Limitation;Deconditioning;Decreased Level of Alertness;Decreased Activity Tolerance;Cognitive Deficits  Assessment/Progress: Should Improve w/ Continued PT  Comments: Pt presented with decreased alertness this date. Pt tolerated sitting edge of bed briefly. Pt tearful and holding head. Pt assisted back to bed. Pt continues to require assist of 2 for mobilty and requires skilled therapy.    AM-PAC 6 Clicks Basic Mobility Inpatient  Turning from your back to your side while in a flat bed without using bed rails: A lot  Moving from lying on your back to sitting on the side of a flat bed without using bedrails : A Lot  Moving to and from a bed to a chair (including a wheelchair): Total  Standing up from a chair using your arms (e.g. wheelchair, or bedside chair): Total  To walk in hospital room: Total  Climbing 3-5 steps with a railing: Total  Basic Mobility Inpatient Raw Score: 8  Standardized (T-scale) Score: 22.61  AM-PAC Basic Mobility Functional Stage: -11.95-33 Limited Movement  Mobility Goal [facility]: JH-HLM 3 Sit Edge of Bed    Goals  Goal Formulation: With Patient/Family  Time For Goal Achievement: 7 days  Patient Will Go Supine To/From Sit: w/ Moderate Assist  Patient Will Transfer Bed/Chair: w/ Assist of  2  Patient Will Transfer Sit to Stand: w/ Assist of 2  Patient Will Ambulate: 51-100 Feet, w/ Cane, w/ Stand By Assist  Patient Will Go Up / Down Stairs: 1-2 Stairs, w/ Stand By Assist    Plan  Treatment Interventions: Mobility training;Strengthening;Balance activities;Endurance training  Plan Frequency: 1-2 Days per Week  PT Plan for Next Visit: upright tolerance, bed mobility, sitting balance; transfer to chair as able pending cognition    PT Discharge Recommendations  Recommendation: Inpatient setting  Patient Currently Requires Physical Assist With: All mobility;All personal care ADLs;All home functioning ADLs    Therapist: Rocky Piety, PT  Date: 11/28/2023

## 2023-11-29 ENCOUNTER — Inpatient Hospital Stay: Admit: 2023-11-29 | Discharge: 2023-11-29 | Payer: MEDICARE

## 2023-11-29 DIAGNOSIS — I2510 Atherosclerotic heart disease of native coronary artery without angina pectoris: Secondary | ICD-10-CM

## 2023-11-29 DIAGNOSIS — N390 Urinary tract infection, site not specified: Secondary | ICD-10-CM

## 2023-11-29 DIAGNOSIS — Z6821 Body mass index (BMI) 21.0-21.9, adult: Secondary | ICD-10-CM

## 2023-11-29 DIAGNOSIS — N179 Acute kidney failure, unspecified: Secondary | ICD-10-CM

## 2023-11-29 DIAGNOSIS — Z794 Long term (current) use of insulin: Secondary | ICD-10-CM

## 2023-11-29 DIAGNOSIS — Z8619 Personal history of other infectious and parasitic diseases: Secondary | ICD-10-CM

## 2023-11-29 DIAGNOSIS — F4323 Adjustment disorder with mixed anxiety and depressed mood: Secondary | ICD-10-CM

## 2023-11-29 DIAGNOSIS — Z9049 Acquired absence of other specified parts of digestive tract: Secondary | ICD-10-CM

## 2023-11-29 DIAGNOSIS — I252 Old myocardial infarction: Secondary | ICD-10-CM

## 2023-11-29 DIAGNOSIS — I69898 Other sequelae of other cerebrovascular disease: Secondary | ICD-10-CM

## 2023-11-29 DIAGNOSIS — Z781 Physical restraint status: Secondary | ICD-10-CM

## 2023-11-29 DIAGNOSIS — E43 Unspecified severe protein-calorie malnutrition: Secondary | ICD-10-CM

## 2023-11-29 DIAGNOSIS — N400 Enlarged prostate without lower urinary tract symptoms: Secondary | ICD-10-CM

## 2023-11-29 DIAGNOSIS — Z7982 Long term (current) use of aspirin: Secondary | ICD-10-CM

## 2023-11-29 DIAGNOSIS — Z833 Family history of diabetes mellitus: Secondary | ICD-10-CM

## 2023-11-29 DIAGNOSIS — E119 Type 2 diabetes mellitus without complications: Secondary | ICD-10-CM

## 2023-11-29 DIAGNOSIS — E785 Hyperlipidemia, unspecified: Secondary | ICD-10-CM

## 2023-11-29 DIAGNOSIS — G4700 Insomnia, unspecified: Secondary | ICD-10-CM

## 2023-11-29 DIAGNOSIS — Z79899 Other long term (current) drug therapy: Secondary | ICD-10-CM

## 2023-11-29 DIAGNOSIS — Z7902 Long term (current) use of antithrombotics/antiplatelets: Secondary | ICD-10-CM

## 2023-11-29 DIAGNOSIS — Z955 Presence of coronary angioplasty implant and graft: Secondary | ICD-10-CM

## 2023-11-29 DIAGNOSIS — R441 Visual hallucinations: Secondary | ICD-10-CM

## 2023-11-29 DIAGNOSIS — Z87891 Personal history of nicotine dependence: Secondary | ICD-10-CM

## 2023-11-29 DIAGNOSIS — R64 Cachexia: Secondary | ICD-10-CM

## 2023-11-29 DIAGNOSIS — I10 Essential (primary) hypertension: Secondary | ICD-10-CM

## 2023-11-29 DIAGNOSIS — I69320 Aphasia following cerebral infarction: Secondary | ICD-10-CM

## 2023-11-29 DIAGNOSIS — Z431 Encounter for attention to gastrostomy: Secondary | ICD-10-CM

## 2023-11-29 DIAGNOSIS — Z1629 Resistance to other single specified antibiotic: Secondary | ICD-10-CM

## 2023-11-29 DIAGNOSIS — G9349 Other encephalopathy: Secondary | ICD-10-CM

## 2023-11-29 LAB — POC GLUCOSE
~~LOC~~ BKR POC GLUCOSE: 402 mg/dL — ABNORMAL HIGH (ref 70–100)
~~LOC~~ BKR POC GLUCOSE: 468 mg/dL — ABNORMAL HIGH (ref 70–100)
~~LOC~~ BKR POC GLUCOSE: 178 mg/dL — ABNORMAL HIGH (ref 70–100)
~~LOC~~ BKR POC GLUCOSE: 352 mg/dL — ABNORMAL HIGH (ref 70–100)
~~LOC~~ BKR POC GLUCOSE: 357 mg/dL — ABNORMAL HIGH (ref 70–100)
~~LOC~~ BKR POC GLUCOSE: 406 mg/dL — ABNORMAL HIGH (ref 70–100)

## 2023-11-29 LAB — URINALYSIS DIPSTICK REFLEX TO CULTURE
~~LOC~~ BKR LEUKOCYTES: NEGATIVE
~~LOC~~ BKR NITRITE: NEGATIVE
~~LOC~~ BKR URINE BILE: NEGATIVE
~~LOC~~ BKR URINE BLOOD: NEGATIVE
~~LOC~~ BKR URINE KETONE: NEGATIVE /HPF — AB
~~LOC~~ BKR URINE SPEC GRAVITY: 1 (ref 1.005–1.030)

## 2023-11-29 LAB — URINALYSIS MICROSCOPIC REFLEX TO CULTURE
~~LOC~~ BKR RBC, UA: 0 /HPF — AB
~~LOC~~ BKR WBC, UA: 2 /HPF — AB (ref 5.0–8.0)

## 2023-11-29 MED ORDER — INSULIN ASPART 100 UNIT/ML SC FLEXPEN
12 [IU] | Freq: Four times a day (QID) | SUBCUTANEOUS | 0 refills | 30.00000 days | Status: DC
Start: 2023-11-29 — End: 2023-11-29

## 2023-11-29 MED ORDER — INSULIN ASPART 100 UNIT/ML SC FLEXPEN
10 [IU] | Freq: Once | SUBCUTANEOUS | 0 refills | Status: CP
Start: 2023-11-29 — End: ?

## 2023-11-29 MED ORDER — INSULIN GLARGINE 100 UNIT/ML (3 ML) SC INJ PEN
20 [IU] | Freq: Every evening | SUBCUTANEOUS | 0 refills | Status: DC
Start: 2023-11-29 — End: 2023-11-30

## 2023-11-29 MED ORDER — MODAFINIL 100 MG PO TAB
ORAL_TABLET | GASTROSTOMY | 0 refills | Status: SS
Start: 2023-11-29 — End: ?

## 2023-11-29 MED ORDER — INSULIN ASPART 100 UNIT/ML SC FLEXPEN
12 [IU] | Freq: Four times a day (QID) | SUBCUTANEOUS | 0 refills | Status: SS
Start: 2023-11-29 — End: ?

## 2023-11-29 MED ORDER — MIRTAZAPINE 15 MG PO TAB
15 mg | Freq: Every evening | GASTROSTOMY | 0 refills | Status: SS
Start: 2023-11-29 — End: ?

## 2023-11-29 MED ORDER — AMOXICILLIN-POT CLAVULANATE 400-57 MG/5 ML PO SUSR
875 mg | Freq: Two times a day (BID) | NASOGASTRIC | 0 refills | Status: SS
Start: 2023-11-29 — End: ?

## 2023-11-29 MED ORDER — INSULIN GLARGINE 100 UNIT/ML (3 ML) SC INJ PEN
20 [IU] | Freq: Every day | SUBCUTANEOUS | 0 refills | Status: SS
Start: 2023-11-29 — End: ?

## 2023-11-29 MED ORDER — MODAFINIL 100 MG PO TAB
GASTROSTOMY | 0 refills | 30.00000 days | Status: DC
Start: 2023-11-29 — End: 2023-11-29

## 2023-11-29 MED ORDER — TRAZODONE(#) 10MG/ML PO SUSP
0 refills | Status: SS
Start: 2023-11-29 — End: ?

## 2023-11-29 MED ORDER — SENNOSIDES-DOCUSATE SODIUM 8.6-50 MG PO TAB
1 | Freq: Every day | GASTROSTOMY | 0 refills | Status: SS | PRN
Start: 2023-11-29 — End: ?

## 2023-11-29 MED ORDER — AMOXICILLIN-POT CLAVULANATE 400-57 MG/5 ML PO SUSR
875 mg | Freq: Two times a day (BID) | NASOGASTRIC | 0 refills | Status: DC
Start: 2023-11-29 — End: 2023-11-30
  Administered 2023-11-29: 20:00:00 875 mg via NASOGASTRIC

## 2023-11-29 NOTE — Case Management (ED)
 Case Management Progress Note    NAME:Kevin Obrien                          MRN: [id number]              DOB:[birth year]          AGE: 73 y.o.  ADMISSION DATE: 11/21/2023             DAYS ADMITTED: LOS: 8 days      Today's Date: 11/29/2023    PLAN: [name] at 6:30-7pm via stretcher thru [employer], [phone]    Expected Discharge Date: 11/29/2023 6:30 PM  Is Patient Medically Stable: Yes   Are there Barriers to Discharge? no    INTERVENTION/DISPOSITION:  Pt stable for discharge.  Updated facility liaison, Beverley, [phone], and he set up stretcher transport  Updated bedside RN, team, and pt's family.  Tasked CMA to deliver transfer packet.  Orders faxed to facility      Discharge Planning              Discharge Planning: Skilled Nursing Facility  Transportation                 Support                 Info or Referral                 Positive SDOH Domains and Potential Barriers                   Medication Needs                                                                                                                                                         Financial                 Legal                 Other                 Discharge Disposition  Selected Continued Care - Admitted Since 11/21/2023       [place] Destination Coordination complete.      Service Provider Services Address Phone Fax Patient Preferred    [name] [address] [facility] [address], [place] [address] [phone] --                      Tully Dutch  Available on [place]  [phone]

## 2023-11-29 NOTE — Progress Notes
 OCCUPATIONAL THERAPY  PROGRESS NOTE      Name: Kevin Obrien   MRN: [id number]     DOB: 08-Jan-1951      Age: 73 y.o.  Admission Date: 11/21/2023     LOS: 8 days     Date of Service: 11/29/2023      Mobility  Patient Turn/Position: Self  Mobility Level [facility] Highest Level of Mobility (JH-HLM): Bed activities/dependent transfers  Level of Assistance: Assist X2  Assistive Device: Lift - total body  Activity Limited By: Mental Status Variability;Weakness    Subjective  Significant Hospital Events: PMH of BPH, diabetes mellitus, HTN, HLD, CAD s/p PCI, Acute right pontomedullary infarct admitted to [facility] 5/19, transferred now for hematochezia.  Special Considerations: Feeding tube present  Mental / Cognitive: Lethargic;Inconsistent with command following (briefly opens eyes, alertness increases once in chair)  Pain: Demonstrates no signs of pain  Persons Present: Rehabilitation technician;Daughter    Home Living Situation  Lives With: Family  Type of Home: House  Entry Stairs: 2  In-Home Stairs: Able to live on main level  Bathroom Setup: Tub/shower unit  Patient Owned Equipment: Cane: Single point  Comments: Pt denies prior use of DME.    Prior Level of Function  Level Of Independence: Independent with ADL and household mobility with device  Comments: intermittent use of SPC per daughter  History of Falls in Past 3 Months: Yes  Occupation/Education: High school level education;Employed  Comments: Above information gained via EMR. Patient with recent CVA and subsequent discharge to [facility] 10/28/23, now readmitted 6/12.    ADL's  Where Assessed: Supine, Bed  Grooming Assist: Total Assist  Grooming Deficits: Wash/Dry Face  Comment: provided cold washcloth to increase level of alertness for therapy    ADL Mobility  Bed Mobility Comments: Max x2 to roll for hoyer sling placement  Transfer Type: Horizontal  Transfer: Assistance Level: From;Bed;To;Bedside chair;Dependent assist;x2 people  Transfer: Assistive Device: Lift - total body  Transfer: Type of Assistance: For balance;For safety considerations;For strength deficit  End of Activity Status: Up in chair;Nursing notified;Instructed patient to use call light;Instructed patient to request assist with mobility (daughter present, hoyer sling under patient)  Transfer Comments: Pt more alert once up in recliner.    Activity Tolerance  Endurance: 2/5 Tolerates 10-20 Minutes Exercise w/Multiple Rests    Cognition  Overall Cognitive Status: Impaired  Comprehension: Language Barrier  Expression:  (no verbalizations this date, patient would move R hand and nod head)    Education  Persons Educated: Patient/Family  Barriers To Learning: Cognitive Deficits;Impaired Communication;Language Barrier  Interventions: Repetition of Instructions  Teaching Methods: Verbal Instruction  Patient Response: More Instruction Required  Topics: Role of OT, Goals for Therapy  Goal Formulation: With Patient/Family    Assessment  Assessment: Decreased ADL Status;Decreased UE ROM;Decreased UE Strength;Decreased Safe/Judg during ADL;Decreased Cognition;Decreased Endurance;Decreased Self-Care Trans;Decreased High-Level ADLs  Prognosis: Fair;w/Cont OT s/p Acute Discharge  Goal Formulation: Pt/family    AM-PAC 6 Clicks Daily Activity Inpatient  Putting on and taking off regular lower body clothes: Total  Bathing (Including washing, rinsing, drying): Total  Toileting, which includes using toilet, bedpan, or urinal: Total  Putting on and taking off regular upper body clothing: A Lot  Taking care of personal grooming such as brushing teeth: A Lot  Eating meals: Total  Daily Activity Raw Score: 8  Standardized (T-scale) Score: 22.86    Plan  OT Frequency: 3-5x/week  OT Plan for Next Visit: EOB activities, move to recliner, repeated STS  ADL Goals  Patient Will Perform All ADL's: w/ Stand By Assist    Functional Transfer Goals  Pt Will Perform All Functional Transfers: w/ Stand By Assist    OT Discharge Recommendations  Recommendation: Inpatient setting  Patient Currently Requires Physical Assist With: All mobility;All personal care ADLs;All home functioning ADLs      Therapist: Saddie Jerilynn MEMO, [place] [id number]  Date: 11/29/2023

## 2023-11-29 NOTE — Progress Notes
 Chaplain Note:    Patient is Catholic.  Chaplain visited to offer prayer, support and sacramental care.   Patient appeared calm and hopeful but concerned about his health and asked for prayer and sacramental care for healing.  Patient shared about his illness and concerns.  Chaplain offered active listening, support, prayer and provided the Sacrament of the Sick / Last Rites for peace, hope and healing.  Patient appreciated for visit.  Continue available for support and prayer.     The spiritual care team is available as needed, 24/7, through the campus switchboard [phone]). For a response within 24 hours, please submit an order in [facility] for a chaplain consult.

## 2023-11-29 NOTE — Discharge Instructions - Pharmacy
 Discharge Summary      Name: Kevin Obrien  Medical Record Number: [id number]        Account Number:  [id number]  Date Of Birth:  07-19-1950                         Age:  73 y.o.  Admit date:  11/21/2023                     Discharge date:  11/29/2023       Discharge Attending:  Clif Serio, MD  Discharge Summary Completed By: Vanetta Sleight, MD    Service: Med Private [phone]    Reason for hospitalization:  Hematochezia [K92.1]    Primary Discharge Diagnosis:   Hematochezia    Hospital Diagnoses:  Hematochezia    Significant Past Medical History        Accidental fall  Angina pectoris  Arthritis  BPH (benign prostatic hyperplasia)  Cataract  Constipation  Coronary artery disease  DM (diabetes mellitus) (CMS-HCC)  ED (erectile dysfunction)  Heart attack (CMS-HCC)  Hyperlipidemia  Hypertension  Joint pain  Neuropathy  Shingles    Allergies   Seasonal allergies    Brief Hospital Course   The patient was admitted and the following issues were addressed during this hospitalization: (with pertinent details including admission exam/imaging/labs).        This is a  73 y.o. male with past medical history of BPH, diabetes mellitus, hypertension, hyperlipidemia, coronary artery disease status post PCI, Acute right pontomedullary infarct admitted to rehab, transferred now for hematochezia.      Hematochezia -resolved  Diverticulosis  -likely the source of bleed  - Had bright red blood in stools   - Iron studies 11/18/2023: Iron 104, % saturation 26, TIBC 398, ferritin 208, transferrin 267  - Was currently on aspirin  and Plavix  for recent stroke  -EGD unremarkable.  Colonoscopy showed diverticulosis but no active bleeding  -Bleeding has slowed down and resolved eventually, also added hemorrhoid suppositories which helped  Plan:  - Gastroenterology consulted.   - ok to resume Plavix  after discussion with GI 6/19  -If he has rebleed can consider to switch to single agent only   - Stopping Protonix    -patient hemoglobin remained stable and no further bleeding        UTI - present on admission s/p tx  S/p Foley cath -was placed on admission arrival  Urinary retention periodically since stroke  - Urine culture from 11/17/2023: >100,000 E coli resistant to Bactrim   - Status post 5 days ceftriaxone   -Foley catheter removal and trial with as needed bladder scan and straight cathing 6/15     Concern for aspiration versus early pneumonia  -White blood cell count elevating last day of discharge and prior day he had episode of aspiration on his saliva per family  - Chest x-ray shows right lower lobe consolation early pneumonia versus aspiration  -Empiric treatment with 5 days of Augmentin  still okay to discharge    Hypertension  - PTA meds     CAD status post PCI  - Continue PTA aspirin         Recent right pontomedullary CVA  Expressive aphasia  Poststroke sleepiness during the day  Poststroke encephalopathy  - Hold PTA Plavix  as above  - Continue PTA aspirin , atorvastatin   - PT/OT   -Psychiatric consultation for some agitation   Recommendations:  Continue mirtazapine  15 mg nightly for mood, sleep, and  appetite   Change trazodone  12.5 mg twice a day PRN for agitation (reduced from 25 mg twice daily to help him perk up during the day for rehab therapy)  Will schedule trazodone  25 mg nightly at bedtime for insomnia  -Speech therapy to evaluate swallowing -strict n.p.o. given aspiration risk  - Restarting PTA modafinil  which was started 200 mg in the morning and 100 mg in the afternoons to help alertness during the day     Acute on subacute encephalopathy -family feels is more confused and weaker in bilateral upper extremity since last week, repeat CT head no acute changes and showed evolution of prior recent stroke.  Patient appears to be back to baseline.   improving but fluctuates day-to-day.              Type 2 diabetes  - Continue PTA Lantus  17 units  - LDCF     Hyperlipidemia  - Continue PTA rosuvastatin  40 mg             Active Wounds Items Needing Follow Up   Pending items or areas that need to be addressed at follow up: Consider repeating hemoglobin and kidney function in a week or 2.  Follow outpatient with PCP after rehab and neurology.    Pending Labs and Follow Up Radiology    Pending labs and/or radiology review at this time of discharge are listed below: if this area is blank, there are no items for review.   Pending Labs       Order Current Status    EXTRA CLEAR URINE TUBE In process    EXTRA URINE GRAY TOP In process    URINALYSIS, COMPLETE W/REFLEX TO CULTURE In process              Medications      Medication List      START taking these medications     amoxicillin /K clavulanate 400 mg/5 mL oral suspension; Commonly known   as: AUGMENTIN ; Dose: 875 mg; 10.9 mL by Per NG tube route twice daily for   7 days. Take with food.  Indications: a lower respiratory infection; For:   a lower respiratory infection; Refills: 0   sennosides-docusate sodium  8.6/50 mg tablet; Commonly known as:   SENOKOT-S; Dose: 1 tablet; one tablet by Per G Tube route daily as needed   (Constipation - First Line). Indications: constipation; For: constipation;   Refills: 0     CHANGE how you take these medications     insulin  aspart (U-100) 100 unit/mL (3 mL) PEN; Commonly known as:   NOVOLOG  FLEXPEN U-100 INSULIN ; Dose: 12 Units; Inject twelve Units under   the skin four times daily. PLUS Correction Factor if: BS 181-220=2 units,   BS 221-260=4 units, BS 261-300=6 units, BS 301-350=8 units, BS 351-400=10   units, BS >400=12 units  Indications: type 2 diabetes mellitus; For: type   2 diabetes mellitus; Refills: 0; What changed: how much to take,   additional instructions, Another medication with the same name was   removed. Continue taking this medication, and follow the directions you   see here.   insulin  glargine 100 unit/mL (3 mL) subcutaneous PEN; Commonly known as:   LANTUS  SOLOSTAR U-100 INSULIN ; Dose: 20 Units; Doctor's comments: The pharmacist may select Lantus  Solorstar or Basaglar  Kary based on what   is cheaper for the patient.; Inject twenty Units under the skin daily.   Indications: type 2 diabetes mellitus; For: type 2 diabetes mellitus;   Refills: 0;  What changed: how much to take   mirtazapine  15 mg tablet; Commonly known as: REMERON ; Dose: 15 mg; Take   one tablet via feeding tube at bedtime daily. Indications: major   depressive disorder; For: major depressive disorder; Refills: 0; What   changed: how to take this   modafiniL  100 mg tablet; Commonly known as: PROVIGIL ; Take two tablets   via feeding tube daily AND one tablet daily. Indications: post-stroke   sleepiness; For: post-stroke sleepiness; Quantity: 90 tablet; Refills: 0;   What changed: See the new instructions.   trazodone (#) 10 mg/mL; Commonly known as: DESYREL ; Take 1.25ml (12.5 mg)   twice daily as needed and 2.5ml (25 mg) nightly at bed time  Indications:   PRN for agitation; For: PRN for agitation; Refills: 0; What changed: how   much to take, how to take this, when to take this, reasons to take this,   additional instructions     CONTINUE taking these medications     acetaminophen  325 mg tablet; Commonly known as: TYLENOL ; Dose: 650 mg;   Take two tablets via feeding tube every 6 hours as needed for Pain.   Indications: pain; For: pain; Refills: 0   amLODIPine  5 mg tablet; Commonly known as: NORVASC ; Dose: 5 mg; one   tablet by Per NG tube route daily. Indications: high blood pressure; For:   high blood pressure; Refills: 0   artificial tears (PF) single dose ophthalmic solution; Dose: 2 drop;   Apply two drops to both eyes three times daily. Indications: dry eye; For:   dry eye; Refills: 0   aspirin  81 mg chewable tablet; Dose: 81 mg; one tablet by Per NG tube   route daily. Indications: stroke prevention; For: stroke prevention;   Refills: 0   clopiDOGreL  75 mg tablet; Commonly known as: PLAVIX ; Dose: 75 mg; one   tablet by Per NG tube route daily. Indications: prevention for a blood   clot going to the brain; For: prevention for a blood clot going to the   brain; Refills: 0   dextrose  50% syringe; Commonly known as: D50; Dose: 12.5-25 g;   Administer 25-50 mL through vein as Needed (=< 70 mg/dL: See admin   instructions). Indications: hypoglycemia; For: hypoglycemia; Refills: 0   diclofenac  sodium 1 % topical gel; Commonly known as: VOLTAREN ; Dose: 2   g; Apply two g topically to affected area four times daily. Indications:   pain; For: pain; Refills: 0   doxazosin  4 mg tablet; Commonly known as: CARDURA ; Dose: 4 mg; Take one   tablet via feeding tube at bedtime daily. Indications: enlarged prostate   with urination problem; For: enlarged prostate with urination problem;   Refills: 0   lidocaine  5 % topical patch; Commonly known as: LIDODERM ; Dose: 1 patch;   Apply one patch topically to affected area daily. Apply patch for 12   hours, then remove for 12 hours before repeating.  Indications:   neuropathic pain; For: neuropathic pain; Refills: 0   lidocaine  hcl 2 % mucosal jelly; Commonly known as: XYLOCAINE ; Apply    topically to affected area as Needed. Indications: local anesthesia for   urethral pain; For: local anesthesia for urethral pain; Refills: 0   melatonin 5 mg tablet; Dose: 5 mg; Take one tablet via feeding tube at   bedtime daily. Indications: difficulty sleeping; For: difficulty sleeping;   Refills: 0   petrolatum  57.7-31.9 % Oint ophthalmic ointment; Commonly known as:   STYE; Dose: 0.25 inch; Apply one-quarter inch  to both eyes three times   daily. Indications: dry eye; For: dry eye; Refills: 0   phenoL 1.4 % mouth spray; Commonly known as: CHLORASEPTIC; Dose: 2   spray; Take two sprays by mouth or throat as directed as Needed.   Indications: irritation of the mouth; For: irritation of the mouth;   Refills: 0   propranoloL  10 mg tablet; Commonly known as: INDERAL ; Dose: 10 mg; one   tablet by Per NG tube route twice daily. Indications: high blood pressure; For: high blood pressure; Refills: 0   rosuvastatin  40 mg tablet; Commonly known as: CRESTOR ; Dose: 40 mg; Take   one tablet via feeding tube daily. Indications: excessive fat in the   blood; For: excessive fat in the blood; Refills: 0   thiamine  mononitrate (vit B1) 100 mg tablet; Dose: 100 mg; one tablet by   Per NG tube route daily. Indications: deficiency in thiamine  or vitamin   B1; For: deficiency in thiamine  or vitamin B1; Refills: 0     STOP taking these medications     enoxaparin  40 mg injection syringe; Commonly known as: LOVENOX    oxyCODONE  5 mg tablet; Commonly known as: ROXICODONE    peg-electrolyte solution 420 gram oral solution; Commonly known as:   NULYTELY    senna 8.6 mg tablet; Commonly known as: SENOKOT       Return Appointments and Scheduled Appointments     Scheduled appointments:      Dec 25, 2023 3:30 PM  Follow-up visit with Morna CHRISTELLA Foyer, APRN-NP  Neurology: [facility] (Neurology) [address]  [place]  [place] [address]  [phone]          Contact information for after-discharge care                [place]       [facility] [address]    Phone: [phone]    Fax: [phone]    Where: [address], [place] [address]    Service: Skilled Nursing                  Consults, Procedures, Diagnostics, Micro, Pathology   Consults: GI, psychiatry  Surgical Procedures & Dates: as above  Significant Diagnostic Studies, Micro and Procedures: noted in brief hospital course  Significant Pathology: noted in brief hospital course  Nutrition: Dietitian Documentation Malnutrition present on admission  ICD-10 code E43: Acute illness/Severe malnutrition    Weight loss: Greater than 5% x 1 month, Moderate loss of muscle mass, Moderate loss of body fat          Loss of Subcutaneous Fat: Yes Moderate Orbital, Triceps  Muscle Wasting: Yes Moderate Clavicle, Temple                 Malnutrition Interventions: Nutrition needs provided by EN    Discharge Disposition, Condition   Patient Disposition: Skilled Nursing Facility [03]  Condition at Discharge: Serious    Code Status     Code Status History       Date Active Date Inactive Code Status Order ID          10/28/2023 1315 11/21/2023 2323 Full Code [id number]  Andria Corean CHRISTELLA, DO Inpatient    10/28/2023 1315 10/28/2023 1315 Full Code [id number]  Andria Corean CHRISTELLA, DO Inpatient    Only showing the last 2 code statuses.            Patient Instructions        Other Diet    Strict  NPO unless reevaluated by Speech for aspiration.    If you have questions about your diet after you go home, you can call a dietitian at [phone].     Tube Feeding    Formula: Jevity 1.5   Schedule: Bolus amount 1.5 carton  How often: four times a day  Flush/Rinse: Use water  100 milliliters at one time.  Give four times a day.     Activity as Tolerated    It is important to keep increasing your activity level after you leave the hospital.  Moving around can help prevent blood clots, lung infection (pneumonia) and other problems.  Gradually increasing the number of times you are up moving around will help you return to your normal activity level more quickly.  Continue to increase the number of times you are up to the chair and walking daily to return to your normal activity level. Begin to work toward your normal activity level at discharge     Report These Signs and Symptoms    Please contact your doctor if you have any of the following symptoms: temperature higher than 100.4 degrees F, uncontrolled pain, persistent nausea and/or vomiting, difficulty breathing, chest pain, severe abdominal pain, headache, unable to urinate, unable to have bowel movement or drainage with a foul odor     Questions About Your Stay    If you have an emergency after discharge, please dial 9-1-1.    You may contact your discharging physician up to 7 days after discharge for questions about your hospitalization, discharge instructions, or medications by calling [phone] during regular business hours (8AM-4PM) and asking to speak to the doctor listed on the discharge information.       If you are not calling during business hours, ask for the on-call doctor.    If you have new  or worsening symptoms, you may be directed to your primary care provider (PCP) for ongoing questions, an Emergency Department, or an Urgent Care Clinic for a more immediate evaluation.    For all calls or questions more than 7 days after discharge, please contact your primary care provider (PCP).    For medications after discharge: pain (opioid) medicine cannot be refilled or prescribed by calling your discharging physician.  These medications need to be filled by your primary care provider (PCP).  Regular refill requests should be directed to your primary care provider (PCP).     Discharging attending physician: Riel Hirschman [[id number]]      Complete if patient is going to a Skilled Nursing Facility     I certify that the patient requires skilled care Yes    The patient's stay is expected to be less than 30 days Yes    I will be in charge of patient in nursing home No        Additional Orders: Case Management, Supplies, Home Health     Home Health/DME       None            Spent more than discharging this patient today.     Signed:  Anyra Kaufman, MD  11/29/2023      cc:  Primary Care Physician:  Bernardino Velma SAUNDERS   Verified    Referring physicians:  No ref. provider found   Additional provider(s):          Did we miss something? If additional records are needed, please fax a request on office letterhead to [phone]. Please include the patient's name, date of birth,  fax number and type of information needed. Additional request can be made by email at [email or web address]. For general questions of information about electronic records sharing, call [phone].

## 2023-11-29 NOTE — Progress Notes
 [facility] discharged on 11/29/2023.    Discharge instructions reviewed with patient and family.      This RN gave report via telephone to [employer], Charity fundraiser at [employer]. This RN removed patient's PIV. This RN went over discharge instructions with patient and patient's daughter at bedside. Patient and patient's daughter at bedside have no further questions/concerns regarding discharge instructions. Patient left unit via stretcher [id number] with [facility].

## 2023-11-29 NOTE — Case Management (ED)
 CMA Note:       Printed and placed transfer packet in the pt's wall unit per request from [facility].    Linard Millers  Case Management Assistant  For additional assistance please contact SWCM  *

## 2023-11-29 NOTE — Progress Notes
 Psychiatric Consultation Progress Note    LOS: 8 days    Current Psychotropic Meds:   Melatonin 5 mg QHS PRN insomnia  Mirtazapine  15 mg QHS   Olanzapine  5 mg IM Q 8 hours PRN for severe agitation   Trazodone  25 mg QHS   Trazodone  25 mg BID PRN agitation    Psychiatric Assessment:  Adjustment disorder with mixed anxiety and depressed mood     Relevant Medical Problems:  Acute ischemic stroke, right pontomedullary  Expressive aphasia  Hematochezia     Recommendations:  No indication to change to change psychotropic medication regimen today (6/20):  Continue mirtazapine  15 mg PO QHS for mood, sleep, and appetite   Continue trazodone  25 mg PO QHS for insomnia  PRNs for Agitation:  Continue trazodone  25 mg PO BID PRN first-line for agitation  SW with primary team assisting with discharge planning. Per SW note, patient with discharge to skilled nursing facility once accepted and insurance authorizes. No psychiatric barriers to discharge plan.   Psychiatry will continue to follow.    Delirium recommendations:  - Avoid benzodiazepines, opiates and anticholinergics if possible as can contribute/cause delirium.   - Frequent reorientation, use of clocks/calendars, verbal reminders of current location and situation.   - A calm, comfortable environment that includes familiar objects from home.   - Involvement of family members.   - Uninterrupted periods of sleep at night, with low levels of noise and minimal light.   - Open blinds during the day to promote daytime alertness and a regular sleep-wake cycle    Reviewed EMR.  Reviewed Laboratory studies.       [name] Baseman, DNP, APRN, PMHNP-BC, PMGT-BC  Nurse Practitioner, Psychiatry  Available via [employer]    Please feel free to contact us  with any additional questions or concerns by paging the consult team between 8am and 5pm on weekdays and between 8am and 3pm on weekends at [phone]. Otherwise, page the psychiatry resident on call.    --------------------------------------------------------------------------------------------------------------------------------  Subjective:  Kevin Obrien 73 year-old male seen at the bedside for follow-up evaluation. Patient is alert and awake, resting in bed. His behavior is calm and cooperative. Patient is intermittently awake during the interview but rousable and able to answer assessment questions. Patient denies SI/HI/AVH. Denies Anxiety and depression. Patient slept well last night.      Per chart review, patient has not required PRN medications for agitation in greater than 48 hours.     Review of Systems   Respiratory:  Negative for cough and shortness of breath.    Cardiovascular:  Negative for chest pain.   Gastrointestinal:  Negative for nausea and vomiting.   Neurological:  Negative for dizziness and headaches.   Psychiatric/Behavioral:  Positive for memory loss. Negative for depression, hallucinations and suicidal ideas. The patient is not nervous/anxious and does not have insomnia.      Objective:                   Vital Signs:  Current                Vital Signs: 24 Hour Range   BP: 145/53 (06/20 1119)  Temp: 37.4 ?C (99.3 ?F) (06/20 1119)  Pulse: 107 (06/20 1119)  Respirations: 16 PER MINUTE (06/20 1119)  SpO2: 93 % (06/20 1119)  O2 Device: None (Room air) (06/20 1119) BP: (121-146)/(53-64)   Temp:  [36.4 ?C (97.5 ?F)-37.9 ?C (100.2 ?F)]   Pulse:  [104-118]   Respirations:  [id number]  PER MINUTE-18 PER MINUTE]   SpO2:  [92 %-98 %]   O2 Device: None (Room air)    Intensity Pain Scale (Self Report): (not recorded)      Scheduled Medications:  amLODIPine  (NORVASC ) tablet 5 mg, 5 mg, Per NG tube, QDAY  aspirin  chewable tablet 81 mg, 81 mg, Per NG tube, QDAY  clopiDOGreL  (PLAVIX ) tablet 75 mg, 75 mg, Per G Tube, QDAY  Diet Enteral Feeding Bolus, , SEE ADMIN INSTRUCTIONS, QID (1,87,83,79)   And  Water  Bolus, 100 mL, PEG Tube, Q4H  doxazosin  (CARDURA ) tablet 4 mg, 4 mg, Feeding Tube, QHS  insulin  aspart (U-100) (NOVOLOG  FLEXPEN U-100 INSULIN ) injection PEN 0-24 Units, 0-24 Units, Subcutaneous, ACHS (22)  insulin  aspart (U-100) (NOVOLOG  FLEXPEN U-100 INSULIN ) injection PEN 10 Units, 10 Units, Subcutaneous, QID  insulin  glargine (LANTUS  SOLOSTAR U-100 INSULIN ) injection PEN 17 Units, 17 Units, Subcutaneous, QHS(22)  mirtazapine  (REMERON ) tablet 15 mg, 15 mg, PEG Tube, QHS  modafiniL  (PROVIGIL ) tablet 200 mg, 200 mg, Per G Tube, QDAY   And  modafiniL  (PROVIGIL ) tablet 100 mg, 100 mg, Feeding Tube, QDAY  petrolatum  (STYE) ophthalmic ointment 0.25 inch, 0.25 inch, Both Eyes, TID  propranoloL  (INDERAL ) tablet 10 mg, 10 mg, Per NG tube, BID  rosuvastatin  (CRESTOR ) tablet 40 mg, 40 mg, Per G Tube, QDAY  thiamine  mononitrate (vit B1) tablet 100 mg, 100 mg, Per NG tube, QDAY  trazodone (#) (DESYREL ) suspension 25 mg, 25 mg, Feeding Tube, QHS        PRN Medications:  acetaminophen  Q4H PRN 650 mg at 11/28/23 2018, dextrose  50% (D50) IV PRN, dextrose  50% (D50) IV PRN, melatonin QHS PRN, pancrelipase  20,880 Units/sodium bicarbonate  650 mg ([place] CLOG DESTROYER) PRN (On Call from Rx), phenoL PRN, sennosides-docusate sodium  QDAY PRN, trazodone (#) BID PRN 25 mg at 11/22/23 2218      Mental Status Exam:  General/Constitutional: appears stated age, good hygiene, dressed in personal attire  Speech/Motor: dysarthric, quiet volume  Mood/Affect:  ok/restricted, mood incongruent   Thought Process: Limited communication  Thought Content: Denies SI/HI  Perception: Denies AVH, not observed to respond to internal stimuli  Insight/Judgment: Limited/limited     Orientation: Oriented to person only  Recent and Remote Memory: Limited recall  Attention span and concentration: Limited  Fund of knowledge and vocabulary: expressive aphasia     Focused Physical Exam:  Musculoskeletal: no obvious MSK abnormalities  Neuro: Bilateral gaze palsy, left facial weakness    [name] Baseman, DNP, APRN, PMHNP-BC, PMGT-BC  Nurse Practitioner, Psychiatry  Available via Volate    Total Time Today was 35 minutes in the following activities: Preparing to see the patient, Obtaining and/or reviewing separately obtained history, Performing a medically appropriate examination and/or evaluation, Counseling and educating the patient/family/caregiver, Documenting clinical information in the electronic or other health record, Independently interpreting results (not separately reported) and communicating results to the patient/family/caregiver, and Care coordination (not separately reported)

## 2023-12-01 ENCOUNTER — Encounter: Admit: 2023-12-01 | Discharge: 2023-12-01 | Payer: MEDICARE

## 2023-12-01 ENCOUNTER — Inpatient Hospital Stay: Admit: 2023-12-01 | Discharge: 2023-12-01 | Payer: MEDICARE

## 2023-12-01 ENCOUNTER — Emergency Department: Admit: 2023-12-01 | Discharge: 2023-12-01 | Payer: MEDICARE

## 2023-12-01 DIAGNOSIS — J9601 Acute respiratory failure with hypoxia: Principal | ICD-10-CM

## 2023-12-01 DIAGNOSIS — J9600 Acute respiratory failure, unspecified whether with hypoxia or hypercapnia: Secondary | ICD-10-CM

## 2023-12-01 DIAGNOSIS — I639 Cerebral infarction, unspecified: Secondary | ICD-10-CM

## 2023-12-01 DIAGNOSIS — R7989 Other specified abnormal findings of blood chemistry: Secondary | ICD-10-CM

## 2023-12-01 DIAGNOSIS — A419 Sepsis, unspecified organism: Secondary | ICD-10-CM

## 2023-12-01 DIAGNOSIS — J189 Pneumonia, unspecified organism: Secondary | ICD-10-CM

## 2023-12-01 DIAGNOSIS — N179 Acute kidney failure, unspecified: Secondary | ICD-10-CM

## 2023-12-01 DIAGNOSIS — G9340 Encephalopathy, unspecified: Secondary | ICD-10-CM

## 2023-12-01 DIAGNOSIS — I214 Non-ST elevation (NSTEMI) myocardial infarction: Secondary | ICD-10-CM

## 2023-12-01 LAB — CBC AND DIFF
~~LOC~~ BKR ABSOLUTE BASO COUNT: 0 10*3/uL — AB (ref 0.00–0.20)
~~LOC~~ BKR ABSOLUTE EOS COUNT: 0 10*3/uL (ref 0.00–0.45)
~~LOC~~ BKR ABSOLUTE MONO COUNT: 0.8 10*3/uL (ref 0.00–0.80)
~~LOC~~ BKR ABSOLUTE NEUTROPHIL: 9.3 10*3/uL — ABNORMAL HIGH (ref 1.80–7.00)
~~LOC~~ BKR BASOPHILS %: 0.3 % — AB (ref 0.0–2.0)
~~LOC~~ BKR EOSINOPHILS %: 0 % — ABNORMAL HIGH (ref 0.0–5.0)
~~LOC~~ BKR HEMATOCRIT: 21 % — ABNORMAL LOW (ref 40.0–50.0)
~~LOC~~ BKR MCV: 85 fL — ABNORMAL HIGH (ref 80.0–100.0)
~~LOC~~ BKR MDW (MONOCYTE DISTRIBUTION WIDTH): 29 — ABNORMAL HIGH (ref <=20.6)
~~LOC~~ BKR MONOCYTES %: 7.2 % — ABNORMAL HIGH (ref 4.0–12.0)
~~LOC~~ BKR MPV: 9.9 fL — AB (ref 7.0–11.0)
~~LOC~~ BKR PLATELET COUNT: 165 10*3/uL (ref 150–400)
~~LOC~~ BKR RBC COUNT: 2.5 10*6/uL — ABNORMAL LOW (ref 4.40–5.50)
~~LOC~~ BKR RDW: 13 % — ABNORMAL LOW (ref 11.0–15.0)
~~LOC~~ BKR WBC COUNT: 11 10*3/uL (ref 4.50–11.00)

## 2023-12-01 LAB — POC HEMATOCRIT&HEMOGLOBIN
~~LOC~~ BKR POC HEMATOCRIT: 28 % — ABNORMAL LOW (ref 40–50)
~~LOC~~ BKR POC HEMATOCRIT: 21 % — ABNORMAL LOW (ref 40–50)
~~LOC~~ BKR POC HEMOGLOBIN: 9.5 g/dL — ABNORMAL LOW (ref 13.5–16.5)
~~LOC~~ BKR POC HEMOGLOBIN: 7.1 g/dL — ABNORMAL LOW (ref 13.5–16.5)

## 2023-12-01 LAB — POC POTASSIUM
~~LOC~~ BKR POC POTASSIUM: 4.2 mmol/L (ref 3.5–5.1)
~~LOC~~ BKR POC POTASSIUM: 4.2 mmol/L (ref 3.5–5.1)

## 2023-12-01 LAB — KC ED MAIN ECG TRIAGE ONLY
P AXIS: 66 degrees
P-R INTERVAL: 134 ms
Q-T INTERVAL: 404 ms
QRS DURATION: 122 ms
QTC CALCULATION (BAZETT): 536 ms
R AXIS: -75 degrees
T AXIS: 58 degrees
VENTRICULAR RATE: 106 {beats}/min

## 2023-12-01 LAB — CONFIRMATION BLOOD TYPE

## 2023-12-01 LAB — POC CREATININE: ~~LOC~~ BKR POC CREATININE: 4.6 mg/dL — ABNORMAL HIGH (ref 0.4–1.24)

## 2023-12-01 LAB — MAGNESIUM: ~~LOC~~ BKR MAGNESIUM: 2 mg/dL (ref 1.6–2.6)

## 2023-12-01 LAB — POC BLOOD GAS VEN
~~LOC~~ BKR POC BICARB, VEN: 21 mmol/L
~~LOC~~ BKR POC BICARB, VEN: 21 mmol/L
~~LOC~~ BKR POC CO2, VEN: 35 mmHg — ABNORMAL LOW (ref 36–50)
~~LOC~~ BKR POC CO2, VEN: 34 mmHg — ABNORMAL LOW (ref 36–50)
~~LOC~~ BKR POC O2 SAT, VEN: 66 % — ABNORMAL LOW (ref 55–71)
~~LOC~~ BKR POC PH, VEN: 7.3 (ref 7.30–7.40)
~~LOC~~ BKR POC PH, VEN: 7.4 (ref 7.30–7.40)

## 2023-12-01 LAB — LIPASE: ~~LOC~~ BKR LIPASE: 17 U/L (ref 11–82)

## 2023-12-01 LAB — POC SODIUM: ~~LOC~~ BKR POC SODIUM: 129 mmol/L — ABNORMAL LOW (ref 137–147)

## 2023-12-01 LAB — NT-PRO-BNP: ~~LOC~~ BKR NT-PRO-BNP: 729 pg/mL — ABNORMAL HIGH (ref <450)

## 2023-12-01 LAB — COMPREHENSIVE METABOLIC PANEL
~~LOC~~ BKR GLOMERULAR FILTRATION RATE (GFR): 13 mL/min — ABNORMAL LOW (ref >60–4.80)
~~LOC~~ BKR POTASSIUM: 4.3 mmol/L — ABNORMAL LOW (ref 3.5–5.1)

## 2023-12-01 LAB — HIGH SENSITIVITY TROPONIN I 0 HOUR: ~~LOC~~ BKR HIGH SENSITIVITY TROPONIN I 0 HOUR: 277 ng/L — ABNORMAL HIGH (ref <20)

## 2023-12-01 LAB — POC LACTATE: ~~LOC~~ BKR POC LACTIC ACID: 2.2 mmol/L — ABNORMAL HIGH (ref 0.5–2.0)

## 2023-12-01 MED ORDER — PIPERACILLIN/TAZOBACTAM 4.5 G/100ML NS IVPB (MB+)
4.5 g | Freq: Two times a day (BID) | INTRAVENOUS | 0 refills | Status: DC
Start: 2023-12-01 — End: 2023-12-07
  Administered 2023-12-02 – 2023-12-06 (×18): 4.5 g via INTRAVENOUS

## 2023-12-01 MED ORDER — GLUCOSE 4 GRAM PO CHEW
4-16 g | ORAL | 0 refills | Status: DC | PRN
Start: 2023-12-01 — End: 2023-12-02

## 2023-12-01 MED ORDER — SENNOSIDES-DOCUSATE SODIUM 8.6-50 MG PO TAB
1 | Freq: Every day | GASTROSTOMY | 0 refills | Status: DC
Start: 2023-12-01 — End: 2023-12-02

## 2023-12-01 MED ORDER — ASPIRIN 81 MG PO CHEW
81 mg | Freq: Every day | ORAL | 0 refills | Status: DC
Start: 2023-12-01 — End: 2023-12-02

## 2023-12-01 MED ORDER — PIPERACILLIN/TAZOBACTAM 4.5 G/100ML NS IVPB (MB+)
4.5 g | Freq: Once | INTRAVENOUS | 0 refills | Status: CP
Start: 2023-12-01 — End: ?
  Administered 2023-12-01 (×2): 4.5 g via INTRAVENOUS

## 2023-12-01 MED ORDER — ROSUVASTATIN 20 MG PO TAB
40 mg | Freq: Every day | GASTROSTOMY | 0 refills | Status: DC
Start: 2023-12-01 — End: 2023-12-02

## 2023-12-01 MED ORDER — LACTATED RINGERS IV SOLP
INTRAVENOUS | 0 refills | Status: DC
Start: 2023-12-01 — End: 2023-12-02
  Administered 2023-12-02: 04:00:00 1000.0000 mL via INTRAVENOUS

## 2023-12-01 MED ORDER — THIAMINE MONONITRATE (VIT B1) 100 MG PO TAB
100 mg | Freq: Every day | NASOGASTRIC | 0 refills | Status: DC
Start: 2023-12-01 — End: 2023-12-02

## 2023-12-01 MED ORDER — ROCURONIUM 10 MG/ML IV SOLN
65 mg | Freq: Once | INTRAVENOUS | 0 refills | Status: CP
Start: 2023-12-01 — End: ?
  Administered 2023-12-01: 23:00:00 65 mg via INTRAVENOUS

## 2023-12-01 MED ORDER — LACTATED RINGERS IV BOLUS
2000 mL | Freq: Once | INTRAVENOUS | 0 refills | Status: CP
Start: 2023-12-01 — End: ?

## 2023-12-01 MED ORDER — INSULIN REGULAR IN 0.9 % NACL 100 UNIT/100 ML (1 UNIT/ML) IV SOLN
0-75 [IU]/h | INTRAVENOUS | 0 refills | Status: DC
Start: 2023-12-01 — End: 2023-12-06
  Administered 2023-12-02: 07:00:00 11.5 [IU]/h via INTRAVENOUS
  Administered 2023-12-02: 14:00:00 25 [IU]/h via INTRAVENOUS
  Administered 2023-12-02: 23:00:00 10 [IU]/h via INTRAVENOUS
  Administered 2023-12-02: 18:00:00 24 [IU]/h via INTRAVENOUS
  Administered 2023-12-02: 02:00:00 8 [IU]/h via INTRAVENOUS
  Administered 2023-12-03: 17:00:00 40 [IU]/h via INTRAVENOUS
  Administered 2023-12-03: 08:00:00 18 [IU]/h via INTRAVENOUS
  Administered 2023-12-03: 14:00:00 28 [IU]/h via INTRAVENOUS
  Administered 2023-12-03: 20:00:00 42 [IU]/h via INTRAVENOUS
  Administered 2023-12-03: 22:00:00 44 [IU]/h via INTRAVENOUS
  Administered 2023-12-04: 12:00:00 13 [IU]/h via INTRAVENOUS
  Administered 2023-12-04: 05:00:00 40 [IU]/h via INTRAVENOUS
  Administered 2023-12-04: 03:00:00 47 [IU]/h via INTRAVENOUS
  Administered 2023-12-04: 01:00:00 44 [IU]/h via INTRAVENOUS
  Administered 2023-12-04: 07:00:00 25 [IU]/h via INTRAVENOUS
  Administered 2023-12-05: 01:00:00 6.5 [IU]/h via INTRAVENOUS
  Administered 2023-12-05: 20:00:00 3.4 [IU]/h via INTRAVENOUS

## 2023-12-01 MED ORDER — VANCOMYCIN 1,250 MG IVPB (VIAL/BAG ADAPTER)
20 mg/kg | Freq: Once | INTRAVENOUS | 0 refills | Status: CP
Start: 2023-12-01 — End: ?
  Administered 2023-12-02 (×2): 1250 mg via INTRAVENOUS

## 2023-12-01 MED ORDER — KETAMINE 10 MG/ML IJ SOLN
65 mg | Freq: Once | INTRAVENOUS | 0 refills | Status: CP
Start: 2023-12-01 — End: ?
  Administered 2023-12-01: 22:00:00 65 mg via INTRAVENOUS

## 2023-12-01 MED ORDER — LINEZOLID IN DEXTROSE 5% 600 MG/300 ML IV PGBK
600 mg | Freq: Two times a day (BID) | INTRAVENOUS | 0 refills | Status: DC
Start: 2023-12-01 — End: 2023-12-02
  Administered 2023-12-02: 12:00:00 600 mg via INTRAVENOUS

## 2023-12-01 MED ORDER — DEXTROSE 5% IN WATER IV BOLUS
80-300 mL | INTRAVENOUS | 0 refills | Status: DC | PRN
Start: 2023-12-01 — End: 2023-12-02

## 2023-12-01 MED ORDER — ASPIRIN 325 MG PO TAB
325 mg | Freq: Once | GASTROSTOMY | 0 refills | Status: CP
Start: 2023-12-01 — End: ?
  Administered 2023-12-02: 03:00:00 325 mg via GASTROSTOMY

## 2023-12-01 MED ORDER — DEXTROSE 50 % IN WATER (D50W) IV SYRG
5-100 mL | INTRAVENOUS | 0 refills | Status: DC | PRN
Start: 2023-12-01 — End: 2023-12-02

## 2023-12-01 MED ORDER — IPRATROPIUM-ALBUTEROL 0.5 MG-3 MG(2.5 MG BASE)/3 ML IN NEBU
3 mL | RESPIRATORY_TRACT | 0 refills | Status: DC | PRN
Start: 2023-12-01 — End: 2023-12-07
  Administered 2023-12-02 – 2023-12-06 (×28): 3 mL via RESPIRATORY_TRACT

## 2023-12-01 MED ORDER — HEPARIN, PORCINE (PF) 5,000 UNIT/0.5 ML IJ SYRG
5000 [IU] | Freq: Two times a day (BID) | SUBCUTANEOUS | 0 refills | Status: DC
Start: 2023-12-01 — End: 2023-12-02
  Administered 2023-12-02 (×2): 5000 [IU] via SUBCUTANEOUS

## 2023-12-01 MED ORDER — FENTANYL CITRATE (PF) 50 MCG/ML IJ SOLN
12.5 ug | INTRAVENOUS | 0 refills | Status: DC | PRN
Start: 2023-12-01 — End: 2023-12-05

## 2023-12-01 MED ORDER — PANTOPRAZOLE 40 MG IV SOLR
40 mg | Freq: Every day | INTRAVENOUS | 0 refills | Status: DC
Start: 2023-12-01 — End: 2023-12-06
  Administered 2023-12-02 – 2023-12-05 (×4): 40 mg via INTRAVENOUS

## 2023-12-01 MED ORDER — PROPOFOL 10 MG/ML IV EMUL
5-70 ug/kg/min | INTRAVENOUS | 0 refills | Status: DC
Start: 2023-12-01 — End: 2023-12-03
  Administered 2023-12-01 (×2): 10 ug/kg/min via INTRAVENOUS

## 2023-12-01 MED ORDER — PANCRELIPASE-SODIUM BICARBONATE 20,880 K UNIT-650 MG
GASTROSTOMY | 0 refills | Status: DC | PRN
Start: 2023-12-01 — End: 2023-12-07

## 2023-12-01 MED ADMIN — LACTATED RINGERS IV SOLP [4318]: 2000 mL | INTRAVENOUS | @ 23:00:00 | Stop: 2023-12-02 | NDC 00338011704

## 2023-12-01 NOTE — ED Notes
 Dr. Vicci at bedside updating family.

## 2023-12-01 NOTE — H&P (View-Only)
 Critical Care   Admission History and Physical Assessment         Name:  Kevin Obrien                                             MRN:  [id number]   Admission Date:  12/01/2023  Principal Problem:    Acute respiratory failure (CMS-HCC)  Active Problems:    Essential hypertension    Dyslipidemia    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)    Ischemic stroke (CMS-HCC)    Acute encephalopathy    On mechanically assisted ventilation (CMS-HCC)    Aspiration pneumonia (CMS-HCC)    NSTEMI (non-ST elevated myocardial infarction) (CMS-HCC)    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 (CMS-HCC)    Anemia    History of gastrointestinal diverticular hemorrhage    Acute kidney injury    Elevated liver enzymes    Coronary artery disease    History of coronary artery stent placement    Diarrhea    Elevated creatine kinase level    Lactic acidosis    Sepsis, unspecified organism (CMS-HCC)    Elevated brain natriuretic peptide (BNP) level    Prolonged QT interval    Transaminitis    Azotemia    Hypochloremia                       Assessment and Plan     Hospital Course:   Kevin Obrien is a 73 y.o. male with PMH of recent Acute R pontomedullary infarct, CAD s/p PCI, GI bleed, BPH, DM2, shingles, HTN, and HLD, who presented to [place] ED from SNF on 6/22 after found to be obtunded by his daughters. Since DC from [place] to [facility] 6/20, he was felt to have continuously declining mental status w/ new O2 requirements of 3LNC, difficulty managing glucose and worsening UOP. Of note, recent admits 5/8-5/17 (s/p fall)-> IP rehab 5/19-6/12, readmitted from rehab for hematochezia 6/12-6/20, again dc'd to [facility] 6/20.     On arrival to [place], his GCS 3, intubated for airway protection/respiratory failure. Initial labs w/ BG 557, Trop 2778, BNP 7292, Cr 4.37, lactate 2.4, AG 15, Hgb 7.7, UA w/ UTI. CT head w/ new large bilateral subacute occipitotemporal and moderate R basal ganglial infarcts; CT chest w/ bilateral lower lobe PNAs. Insulin  gtt started. 1u PRBC for goal Hgb >8 in setting of NSTEMI. Given 2L IVF, Vanc, Zosyn , for UTI/PNA coverage. Admitted to MICU for management of acute respiratory failure, new subacute CVAs, sepsis, hyperglycemia, and NSTEMI.     On arrival to the MICU, repeat labs sent to include broadened infectious w/u. Noted to have diarrhea on arrival, stool panel/CDiff sent w/ c/f ileal inflammatory enteritis vs infection but negative thus far. Neurology consulted, plan for SBP >130, Na ~140, Hgb >8, MRI/MRA head/neck, repeat CT head when glucose <200, and EEG in AM. Follow culture data, continue broad abx.     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   - 5/8 CT Head: Multifocal stenosis of the anterior, middle, and posterior cerebral  arteries most likely secondary to significant intracranial atherosclerosis. No large vessel central occlusion is identified. 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   - 5/8 CTA neck: Bilateral atherosclerotic plaque at the carotid bulbs and origins of  the ICAs without significant/high-grade stenosis of the ICAs by NASCET criteria. 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   - 5/9 MRI Head WO: Small acute right pontomedullary brainstem infarct. Stable old left basal ganglia infarct and old left thalamic and right supratentorial white matter lacunar infarcts. Stable generalized cerebral volume loss and moderate patchy   supratentorial white matter FLAIR hyperintensities, likely due to chronic microvascular ischemia. Intracranial right vertebral stenotic-occlusive disease, better assessed on CTA.   - 5/20 CT Head: Redemonstrated right V4 segment occlusion just distal to the PICA origin. Otherwise grossly similar multifocal intracranial arterial stenoses, greatest of moderate-severe degree involving the bilateral MCA and PCA vasculature. Evolving recent brainstem infarcts better demonstrated on comparison MRI. Otherwise unchanged mild burden of chronic small vessel ischemia with  small chronic left corona radiata-anterior basal ganglia and right  periatrial lacunar type infarcts.   - 5/20 CTA neck: Atherosclerotic stenosis of the bilateral proximal internal carotid  arteries (40% on the left and 20% on the right). 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 Multifocal mild and osseous of the left cervical vertebral artery.   - 6/16 CT Head: Hypodensity lower pons/medulla compatible with late subacute infarct present acutely on prior MRI Oct 29, 2023. Old left basal ganglia/corona radiata infarct. Small right periventricular lacunar infarct. Mild cerebral volume loss. Deep white matter hypodensity consistent with microvascular gliosis. Arterial calcification.   - 6/22 CT Head WO: Development of large subacute appearing bilateral occipitotemporal and moderate sized right corona radiata/capsular/basal ganglia infarcts. No evidence of hemorrhagic conversion. No midline shift or herniation. Stable chronic pontomedullary infarcts, left basal gallbladder-capsular infarct, and old lacunar infarcts, better demonstrated on prior MRI. 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   - UDS, ETOH, Salicylate, Tylenol  negative  - Sedated on arrival. BUE w/ spontaneous, non-purposeful movements. BLEs w/ triple flexion. Eyes: R 2mm, L 3mm.  PLAN   - Neuro following  - SBP goal >120-130  - Corrected Na goal 140  - MRI/MRA Brain, MRA Neck w/ con w/ c/f worsening atherosclerosis vs thrombi  - STAT CT Head at 0400 for stability if/when BG reaches <200 per Neuro, will decide on hypertonics/ higher Na goal  - EEG in AM  - HOB >30 degrees  - Blood Cx x3 w/ c/f endocarditis  - TTE w/ bubble study  - Send A1c, lipid panel  - Bowel regimen to reduce ICP elevation/risk  - Continue PTA Plavix , ASA w/ new infarcts  - Hold PTA Trazodone , Melatonin, Mirtazipine; Consider restarting Modafinil  in AM    Sedated on Mechanical Ventilation   - Propofol  ordered, paused at this time to obtain neuro exam  - Fentanyl  12.5mcg q2hrs  - wean and limit sedation as able   - Triglycerides daily while on  propofol      PULM  Acute Respiratory Failure  Aspiration PNA  Mechanically Ventilated  - C/f atelectasis vs aspiration PNA noted in previous admission 5/8-5/17  - CXR 6/20 prior to discharge to SNF w/ RLL opacities w/ c/f PNA  - Required 1-3LNC 6/21. On arrival to ED, GCS 3 and was intubated for airway protection/respiratory failure.  - CXR: atelectasis, RLL consolidation c/f PNA  - CT Chest: Bilateral lower lobe consolidations with additional scattered patchy   nodular opacities consistent with bilateral lower lobe pneumonia.     - ABG: 7.38/34/87  - VENT: 400/18/5/50%, sats >92%  PLAN   - Wean Fio2 as able for goal sat > 92%   - duonebs q4hrs PRN  - PD&V q4hrs and PRN    CV  Sepsis  NSTEMI likely 2/2 demand ischemia  Lactic Acidosis  Elevated BNP  Prolonged QT  Patent PFO  - likely 2/2 aspiration PNA and UTI  - s/p 2L IVF in ED, s/p 1u PRBC  - Trop 2778->2309->2037, BNP 7292, LA 2.4->2.6->3.6  - EKG SR rate 94, QT 550  - Prior Echo 10/17/23: LV EF 65%, trivial MV regurg, Trace TV regurg, R->L shunt c/w PFO  - Bedside US : Grossly preserved function   - BP 100-110's, HR 90-100's, MAPs >65  PLAN   - Goal Map > 65, SBP > 120-130 per neuro  - NICOM w/ NS w/ c/f cerebral edema as glucose corrects  - Hgb >8  - Trend trop/lactate q4hrs  - Obtain Echo as above  - ASA 325 x1 then 81mg  daily  - Hold on cards at this time  - Avoid QT prolonging meds    CAD s/p PCI  HTN  HLD  - PTA Amlodipine  5mg  daily, Propranolol  10mg  BID, Crestor  40mg  daily  PLAN  - Hold PTA meds w/ c/f shock and higher SBP goal    GI  Mild Transaminitis  Diarrhea  Recent GI bleed  - CT A/P: Mild thickening of distal ileum suggesting nonspecific infectious or inflammatory enteritis. No significant ascites. Moderate diverticulosis coli, most prominent in the proximal sigmoid.  - AST 77, ALT 102, ALP 69, Tbili 0.4  - Last BM on arrival to MICU  PLAN   - NPO  - Start Tfs in AM  - Dietary consult in AM  - Bowel regimen    RENAL  AKI  Azotemia  Elevated CPK likely 2/2 prolonged bed bound?  Hypochloremia  - Likely 2/2 decreased PO PEG intake, infection, and diarrhea  - Baseline Cr 0.8  - FENa 1%, indeterminate  - Cr 4.33, BUN 74, CO2 20, AG 16, Na 137 (corrected), CK 2533  PLAN   - Monitor I/O  - Check Urine Lytes  - Daily CMP, Mg, Phos  - BMP q6hrs w/ pseudohypoNa  - CPK BID  - Renally dose meds where able    ENDO  DMT2, likely 2/2 under treatment  - PTA Aspart SSI, Lanus 20u daily  - SBG: 400's-557  PLAN   - Hold PTA regimen  - Check Hgba1c  - Insulin  gtt, Endotool    ID  PNA  UTI  - MRSA/RVP negative  - Stool panel/CDiff negative  - Fecal Leuks positive  - UA Trace leuks, 20-50 WBCs, many bacteria  - WBC 11, Temp 36.5C  PLAN:  - Continue renally dose Zosyn   - Send procal  - Follow Blood cx, Sputum cx, Urine cx    HEME  Anemia likely 2/2 chronic and sepsis  -  Hgb 7.7, plt 165  PLAN  - Monitor for bleeding  - Daily CBCs  - Start ppx Heparin     MSK  DTI, sacrum/coccyx and L heel  PLAN  - Wound team consult placed    FEN  - IVF: None  - Diet: NPO  - Replace lytes PRN     Prophylaxis Review:  Lines: PIV x4  Tubes/Drains: PEG  Urinary Catheter:  Yes  VTE PPX: Heparin   GI ppx:  Protonix   PT/OT: Yes    Code status: Full Code  Disposition: Admit to ICU    Patient critically ill with above diagnoses. I spent 150 minutes providing critical care services including:  performing a physical examination  serially reviewing laboratory, telemetry, hemodynamic, oximetry, and respiratory data  reviewing radiographic images  reviewing medications  managing fluids/electrolytes, antibiotics, ICU prophylaxis  developing the overall plan of care    Patient seen and discussed with Dr. Robbi Aliment    Chauncey Alderman APRN-NP  Pulmonary/Critical Care  M2 Team Pager [phone]  ___________________________________________________________________________  Primary Care Physician: Bernardino Velma SAUNDERS      CHIEF COMPLAINT:  AMS    HISTORY OF PRESENT ILLNESS: Tallen Schnorr is a 73 y.o. male with PMH of recent Acute R pontomedullary infarct, CAD s/p PCI, GI bleed, BPH, DM2, shingles, HTN, and HLD, who presented to [place] ED from SNF on 6/22 after found to be obtunded by his daughters. Since DC from [place] to [facility] 6/20, he was felt to have continuously declining mental status w/ new O2 requirements of 3LNC, difficulty managing glucose and worsening UOP. Of note, recent admits 5/8-5/17 (s/p fall)-> IP rehab 5/19-6/12, readmitted from rehab for hematochezia 6/12-6/20, again dc'd to [facility] 6/20.     On arrival to [place], his GCS 3, intubated for airway protection/respiratory failure. Initial labs w/ BG 557, Trop 2778, BNP 7292, Cr 4.37, lactate 2.4, AG 15, Hgb 7.7, UA w/ UTI. CT head w/ new large bilateral subacute occipitotemporal and moderate R basal ganglial infarcts; CT chest w/ bilateral lower lobe PNAs. Insulin  gtt started. 1u PRBC for goal Hgb >8 in setting of NSTEMI. Given 2L IVF, Vanc, Zosyn , for UTI/PNA coverage. Admitted to MICU for management of acute respiratory failure, new subacute CVAs, sepsis, hyperglycemia, and NSTEMI.     PMH:  Past Medical History:    Accidental fall    Angina pectoris    Arthritis    BPH (benign prostatic hyperplasia)    Cataract    Constipation    Coronary artery disease    DM (diabetes mellitus) (CMS-HCC)    ED (erectile dysfunction)    Heart attack (CMS-HCC)    Hyperlipidemia    Hypertension    Joint pain    Neuropathy    Shingles        PSH:  Surgical History:   Procedure Laterality Date    CORONARY STENT PLACEMENT  2016    HX CHOLECYSTECTOMY  09/2015    COLONOSCOPY N/A 04/20/2016    Performed by Rogers Kipper, MD at [facility] ENDO    ESOPHAGOGASTRODUODENOSCOPY N/A 04/20/2016    Performed by Rogers Kipper, MD at [facility] ENDO    ESOPHAGOGASTRODUODENOSCOPY BIOPSY  04/20/2016    Performed by Rogers Kipper, MD at [facility] ENDO    UREA BREATH TEST N/A 11/13/2016    Performed by Ledora Catalina, MD at [facility] ENDO    ESOPHAGOGASTRODUODENOSCOPY WITH BIOPSY - FLEXIBLE N/A 11/22/2023    Performed by Bonnetta Camellia BROCKS, MD at [facility] ENDO    COLONOSCOPY WITH  BIOPSY - FLEXIBLE N/A 11/22/2023    Performed by Bonnetta Camellia BROCKS, MD at [facility] ENDO        SOCIAL HISTORY:  Social History     Socioeconomic History    Marital status: Separated    Number of children: 3   Tobacco Use    Smoking status: Former     Current packs/day: 0.00     Average packs/day: 2.0 packs/day for 10.0 years (20.0 ttl pk-yrs)     Types: Cigarettes     Start date: 04/11/1961     Quit date: 04/12/1971     Years since quitting: 52.6    Smokeless tobacco: Never    Tobacco comments:     Former smoker/quit years ago   Vaping Use    Vaping status: Never Used   Substance and Sexual Activity    Alcohol use: Not Currently    Drug use: Never    Sexual activity: Not Currently     Partners: Female     Birth control/protection: None        FAMILY HISTORY:  Family History   Problem Relation Name Age of Onset    Diabetes Mother AMPARO CUBIAS Diabetes Father JOSE Erker     Cancer-Lung Father JOSE Caserta     Melanoma Neg Hx      Cataract Neg Hx      Glaucoma Neg Hx      Macular Degen Neg Hx          IMMUNIZATIONS:   Immunization History   Administered Date(s) Administered    COVID-19 (PFIZER), mRNA vacc, 30 mcg/0.3 mL (PF) 08/24/2019, 09/14/2019, 03/23/2020    DT Vaccine 02/12/2008    Flu Vaccine =>3 YO (Historical) 05/13/2012    Flu Vaccine =>65 YO High-Dose (PF) 03/28/2016, 05/01/2018    Pneumococcal Vaccine (23-Val Adult) 05/13/2012    Pneumococcal Vaccine(13-Val Peds/immunocompromised adult) 01/23/2016    Varicella-Zoster Vaccine - live (ZOSTAVAX) 02/27/2016           ALLERGIES:  Seasonal allergies    HOME MEDICATIONS:  (Not in a hospital admission)                         Vital Signs: Last Filed                  Vital Signs: 24 Hour Range   BP: 111/60 (06/22 1900)  Pulse: 91 (06/22 1900)  Respirations: 18 PER MINUTE (06/22 1900)  SpO2: 95 % (06/22 1900)  O2%: 60 % (06/22 1814)  O2 Device: Ventilator (06/22 1814)  O2 Liter Flow: 15 Lpm (06/22 1720)  Height: 172.7 cm (5' 8) (06/22 1720) BP: (106-128)/(58-67)   Pulse:  [90-109]   Respirations:  [18 PER MINUTE-29 PER MINUTE]   SpO2:  [92 %-100 %]   O2%:  [60 %]   O2 Device: Ventilator  O2 Liter Flow: 15 Lpm     Vitals:    12/01/23 1720   Weight: 63.5 kg (139 lb 15.9 oz)           ROS:    Review of systems not obtained due to patient factors.    Physical Exam:    General appearance: Obtunded, non-cooperative and no acute distress  Head: Normocephalic, without obvious abnormality, atraumatic  Eyes: conjunctivae/corneas clear. R pupil 2mm RRL, L pupil 3mm RRL  Throat: Lips, mucosa, and tongue normal.  Gums normal; edentulous mandible. ETT present.  Neck: supple, symmetrical, trachea midline and no adenopathy  Lungs:  Decreased throughout, especially in bases R>L. Rhonchi in bilateral lower lobes. No wheeze noted. Mechanically ventilated.  Heart: regular rate and rhythm, S1, S2 normal, no murmur, click, rub or gallop  Abdomen: soft, distended, non-tender. Bowel sounds normal. PEG present.  Extremities: extremities normal, atraumatic, no cyanosis or edema  Neurologic: Obtunded, moves BUEs spontaneously w/o purpose. Sedated on vent.  Not following commands. Overbreathing vent.  Peripheral pulses:  2+ and symmetric  Cap Refill:  < 2 sec  Skin: Skin color, texture, turgor normal. DTI present on sacrum/coccyx and L heel.      Artificial Airway   Endotracheal Tube       Ventilator/Respiratory Therapy  Yes: Invasive Mode: V/AC+  Set VT (mL):  [400 milliliters]   Expired VT Spontaneous (mL):  [0 milliliters]   Expiratory VT (mL):  [495 mL-528 mL]   Set RR:  [18 breaths/minutes]   Total Respiratory Rate:  [27 breaths/minutes-29 breaths/minutes]   Minute Volume (Lpm):  [13.8 liters/minutes-14.3 liters/minutes]   %MVspontaneous:  [0 %]   PIP Actual (cmH2O):  [5.5 cm H20-5.8 cm H20]   PEEP/CPAP (cmH2O):  [5 cm H2O]   Mean Airway Pressure (cmH2O):  [5 cm H2O-6 cm H2O]      Vent Weaning   Per protocol    Active Wounds                              Laboratory:    Recent Labs     12/01/23  1721 12/01/23  1729   NA  --  128*   K  --  4.3   CL  --  92*   CO2  --  21   GAP  --  15*   BUN  --  80*   CR 4.6* 4.37*   GLU  --  557*   CA  --  7.9*   ALBUMIN  --  2.9*   MG  --  2.0       Recent Labs     11/29/23  0633 12/01/23  1729   WBC 12.40* 11.00   HGB 9.5* 7.7*   HCT 27.3* 21.5*   PLTCT 235 165   AST  --  77*   ALT  --  102*   ALKPHOS  --  69      Estimated Creatinine Clearance: 13.1 mL/min (A) (based on SCr of 4.37 mg/dL (H)).  Vitals:    12/01/23 1720   Weight: 63.5 kg (139 lb 15.9 oz)    No results for input(s): PHART, PO2ART in the last 72 hours.    Invalid input(s): PC02A              Radiology and Other Diagnostic Procedures Review:    Reviewed     Malnutrition Details:

## 2023-12-02 ENCOUNTER — Encounter: Admit: 2023-12-02 | Discharge: 2023-12-02 | Payer: MEDICARE

## 2023-12-02 ENCOUNTER — Inpatient Hospital Stay: Admit: 2023-12-02 | Discharge: 2023-12-02 | Payer: MEDICARE

## 2023-12-02 DIAGNOSIS — R4182 Altered mental status, unspecified: Secondary | ICD-10-CM

## 2023-12-02 LAB — LACTIC ACID (BG - RAPID LACTATE)
~~LOC~~ BKR LACTIC ACID(SYRINGE): 2.6 mmol/L — ABNORMAL HIGH (ref 0.5–2.0)
~~LOC~~ BKR LACTIC ACID(SYRINGE): 3.6 mmol/L — ABNORMAL HIGH (ref 0.5–2.0)
~~LOC~~ BKR LACTIC ACID(SYRINGE): 2.4 mmol/L — ABNORMAL HIGH (ref 0.5–2.0)
~~LOC~~ BKR LACTIC ACID(SYRINGE): 2.5 mmol/L — ABNORMAL HIGH (ref 0.5–2.0)
~~LOC~~ BKR LACTIC ACID(SYRINGE): 3.1 mmol/L — ABNORMAL HIGH (ref 0.5–2.0)
~~LOC~~ BKR LACTIC ACID(SYRINGE): 2.8 mmol/L — ABNORMAL HIGH (ref 0.5–2.0)

## 2023-12-02 LAB — BLOOD GASES, ARTERIAL
~~LOC~~ BKR BASE DEFICIT-ART: 4.1 mmol/L
~~LOC~~ BKR PH-ART: 7.3 (ref 7.35–7.45)
~~LOC~~ BKR PH-ART: 7.4 mL/min (ref 7.35–7.45)
~~LOC~~ BKR PO2-ART: 102 mmHg — ABNORMAL HIGH (ref 80–100)

## 2023-12-02 LAB — 2D + DOPPLER ECHO
AORTIC VALVE STROKE VOLUME INDEX: 37
AV INDEX (NATIVE): 0.8
AV PEAK VELOCITY: 1.3 m/s
BSA: 1.7 m2
DOP CALC LVOT AREA: 3.4 cm2
DOP CALC LVOT DIAMETER: 2.1 cm
DOP CALC LVOT PEAK VEL VTI: 19 cm
DOP CALC LVOT PEAK VEL: 1.1 m/s
DOP CALC LVOT STROKE VOLUME: 65 cm3
E WAVE DECELARTION TIME: 178 ms
E/A RATIO: 1.5
ECHO EF: 45 %
EJECTION FRACTION: 62 %
FRACTIONAL SHORTENING: 36 % (ref 28–44)
INTERVENTRICULAR SEPTUM: 1.1 cm (ref 0.6–1)
LATERAL E/E' RATIO: 15
LEFT ATRIUM INDEX: 26 mL/m2 (ref 16–34)
LEFT ATRIUM SIZE: 3.5 cm (ref 3–4)
LEFT ATRIUM VOLUME: 46 mL (ref 18–58)
LEFT INTERNAL DIMENSION IN SYSTOLE: 2.6 cm (ref 2.5–4)
LEFT VENTRICLE DIASTOLIC VOLUME INDEX: 63 mL/m2 (ref 34–74)
LEFT VENTRICLE DIASTOLIC VOLUME: 113 mL — ABNORMAL HIGH (ref 62–150)
LEFT VENTRICLE MASS INDEX: 96 g/m2 (ref 49–115)
LEFT VENTRICLE SYSTOLIC VOLUME: 31 mL/m2 — ABNORMAL LOW (ref 21–61)
LEFT VENTRICLE SYSTOLIC VOLUME: 55 mL (ref 21–61)
LEFT VENTRICULAR INTERNAL DIMENSION IN DIASTOLE: 4.1 cm (ref 4.2–5.8)
LEFT VENTRICULAR MASS: 172 g (ref 88–224)
MEDIAL E/E' RATIO: 17
MV PEAK A VEL: 0.9 m/s
MV PEAK E VEL PW: 1.3 m/s
POSTERIOR WALL: 1.3 cm (ref 0.6–1)
RIGHT ATRIAL AREA: 11 cm2 (ref <18)
RIGHT HEART SYSTOLIC MMODE TAPSE: 1.9 cm (ref >1.7)
RIGHT HEART SYSTOLIC TDI S': 0.1 m/s
RIGHT VENTRICULAR BASAL DIAMETER: 3 cm (ref 2.5–4.1)
RIGHT VENTRICULAR MID DIAMETER: 2.7 cm (ref 1.9–3.5)
RV SYSTOLIC PRESSURE: 25
SINUS: 3 cm (ref 2.8–4)
TDI LATERAL E': 0 m/s
TDI MEDIAL E': 0 m/s
TR PEAK VELOCITY: 2.5 m/s

## 2023-12-02 LAB — HIGH SENSITIVITY TROPONIN I 2 HOUR
~~LOC~~ BKR HIGH SENSITIVITY TROPONIN I 2 HOUR: 230 ng/L — ABNORMAL HIGH (ref <20)
~~LOC~~ BKR HIGH SENSITIVITY TROPONIN I 2 HOUR: 221 ng/L — ABNORMAL HIGH (ref <20)
~~LOC~~ BKR HIGH SENSITIVITY TROPONIN I DELTA VALUE: -46
~~LOC~~ BKR HIGH SENSITIVITY TROPONIN I DELTA VALUE: 177

## 2023-12-02 LAB — ECG 12-LEAD
P AXIS: 71 degrees — ABNORMAL HIGH (ref 70–100)
P-R INTERVAL: 128 ms — ABNORMAL HIGH (ref 0.5–2.0)
QRS DURATION: 126 ms — ABNORMAL LOW (ref 137–147)
QTC CALCULATION (BAZETT): 550 ms — ABNORMAL LOW (ref 98–110)
R AXIS: -34 degrees — ABNORMAL HIGH (ref 7–25)
T AXIS: 56 degrees — ABNORMAL HIGH (ref 0.40–1.24)
VENTRICULAR RATE: 94 {beats}/min

## 2023-12-02 LAB — HEPATITIS PANEL, ACUTE

## 2023-12-02 LAB — POC GLUCOSE
~~LOC~~ BKR POC GLUCOSE: 478 mg/dL — ABNORMAL HIGH (ref 70–100)
~~LOC~~ BKR POC GLUCOSE: 497 mg/dL — ABNORMAL HIGH (ref 70–100)
~~LOC~~ BKR POC GLUCOSE: 453 mg/dL — ABNORMAL HIGH (ref 70–100)
~~LOC~~ BKR POC GLUCOSE: 475 mg/dL — ABNORMAL HIGH (ref 70–100)
~~LOC~~ BKR POC GLUCOSE: 427 mg/dL — ABNORMAL HIGH (ref 70–100)
~~LOC~~ BKR POC GLUCOSE: 446 mg/dL — ABNORMAL HIGH (ref 70–100)
~~LOC~~ BKR POC GLUCOSE: 350 mg/dL — ABNORMAL HIGH (ref 70–100)
~~LOC~~ BKR POC GLUCOSE: 261 mg/dL — ABNORMAL HIGH (ref 70–100)
~~LOC~~ BKR POC GLUCOSE: 250 mg/dL — ABNORMAL HIGH (ref 70–100)
~~LOC~~ BKR POC GLUCOSE: 241 mg/dL — ABNORMAL HIGH (ref 70–100)
~~LOC~~ BKR POC GLUCOSE: 272 mg/dL — ABNORMAL HIGH (ref 70–100)
~~LOC~~ BKR POC GLUCOSE: 272 mg/dL — ABNORMAL HIGH (ref 70–100)
~~LOC~~ BKR POC GLUCOSE: 250 mg/dL — ABNORMAL HIGH (ref 70–100)
~~LOC~~ BKR POC GLUCOSE: 247 mg/dL — ABNORMAL HIGH (ref 70–100)
~~LOC~~ BKR POC GLUCOSE: 218 mg/dL — ABNORMAL HIGH (ref 70–100)
~~LOC~~ BKR POC GLUCOSE: 202 mg/dL — ABNORMAL HIGH (ref 70–100)
~~LOC~~ BKR POC GLUCOSE: 184 mg/dL — ABNORMAL HIGH (ref 70–100)
~~LOC~~ BKR POC GLUCOSE: 173 mg/dL — ABNORMAL HIGH (ref 70–100)
~~LOC~~ BKR POC GLUCOSE: 162 mg/dL — ABNORMAL HIGH (ref 70–100)
~~LOC~~ BKR POC GLUCOSE: 147 mg/dL — ABNORMAL HIGH (ref 70–100)
~~LOC~~ BKR POC GLUCOSE: 136 mg/dL — ABNORMAL HIGH (ref 70–100)

## 2023-12-02 LAB — PREPARE RBC - SUNQUEST
ANTIBODY SCREEN: NEGATIVE
BLOOD EXPIRATION DATE: 202
CODING STATUS: 620
ISSUE DATE TIME: 202
PRODUCT CODE: 0
UNIT DIVISION: 0
UNITS ORDERED: 1

## 2023-12-02 LAB — PTT (APTT): ~~LOC~~ BKR PTT: 28 s (ref 24.0–36.5)

## 2023-12-02 LAB — BASIC METABOLIC PANEL: ~~LOC~~ BKR POTASSIUM: 4.6 mmol/L (ref 3.5–5.1)

## 2023-12-02 LAB — CRYPTOSPORIDIUM, FECAL: ~~LOC~~ BKR CRYPTO EIA: NEGATIVE

## 2023-12-02 LAB — TYPE & CROSSMATCH: ~~LOC~~ BKR ANTIBODY SCREEN: NEGATIVE

## 2023-12-02 LAB — HEMOGLOBIN A1C: ~~LOC~~ BKR HEMOGLOBIN A1C: 8.9 % — ABNORMAL HIGH (ref 4.0–5.7)

## 2023-12-02 LAB — SALICYLATE LEVEL

## 2023-12-02 LAB — OPIATES-URINE RANDOM: ~~LOC~~ BKR OPIATES: NEGATIVE

## 2023-12-02 LAB — URINALYSIS MICROSCOPIC REFLEX TO CULTURE
~~LOC~~ BKR RBC, UA: 11 /HPF — AB (ref 24.0–44.0)
~~LOC~~ BKR WBC, UA: 20 /HPF — AB (ref 3.5–5.0)

## 2023-12-02 LAB — LEUKOCYTES, FECAL: ~~LOC~~ BKR FECAL LEUKOCYTES: POSITIVE — AB

## 2023-12-02 LAB — ACETAMINOPHEN LEVEL: ~~LOC~~ BKR ACETAMINOPHEN: <10 g/mL (ref <=20.0)

## 2023-12-02 LAB — RVP VIRAL PANEL PCR

## 2023-12-02 LAB — FENTANYL URINE: ~~LOC~~ BKR FENTANYL URINE: NEGATIVE

## 2023-12-02 LAB — LIPID PROFILE: ~~LOC~~ BKR CHOLESTEROL: 56 mg/dL (ref <200)

## 2023-12-02 LAB — TRIGLYCERIDE: ~~LOC~~ BKR TRIGLYCERIDES: 193 mg/dL — ABNORMAL HIGH (ref <150)

## 2023-12-02 LAB — COMPREHENSIVE METABOLIC PANEL
~~LOC~~ BKR ALK PHOSPHATASE: 71 U/L (ref 25–110)
~~LOC~~ BKR ALT: 93 U/L — ABNORMAL HIGH (ref 7–56)
~~LOC~~ BKR ANION GAP: 19 10*3/uL — ABNORMAL HIGH (ref 3–12)
~~LOC~~ BKR CHLORIDE: 94 mmol/L — ABNORMAL LOW (ref 98–110)
~~LOC~~ BKR CO2: 18 mmol/L — ABNORMAL LOW (ref 21–30)

## 2023-12-02 LAB — POC LACTATE: ~~LOC~~ BKR POC LACTIC ACID: 2.4 mmol/L — ABNORMAL HIGH (ref 0.5–2.0)

## 2023-12-02 LAB — HIGH SENSITIVITY TROPONIN I 4 HR
~~LOC~~ BKR HI SEN TNI DELTA 4-2: 242
~~LOC~~ BKR HIGH SENSITIVITY TROPONIN I 4 HOUR: 245 ng/L — ABNORMAL HIGH (ref <20)

## 2023-12-02 LAB — ALCOHOL LEVEL: ~~LOC~~ BKR ALCOHOL: <10 mg/dL (ref <10)

## 2023-12-02 LAB — AMPHETAMINES-URINE RANDOM: ~~LOC~~ BKR AMPHETAMINES: NEGATIVE

## 2023-12-02 LAB — CANNABINOIDS-URINE RANDOM: ~~LOC~~ BKR CANNABINOIDS (THC): NEGATIVE

## 2023-12-02 LAB — GIARDIA SCREEN,FECAL: ~~LOC~~ BKR GIARDIA EIA: NEGATIVE

## 2023-12-02 LAB — IONIZED CALCIUM: ~~LOC~~ BKR IONIZED CALCIUM: 1 mmol/L (ref 1.00–1.30)

## 2023-12-02 LAB — MRSA BY PCR (NASAL)

## 2023-12-02 LAB — BETA HYDROXYBUTYRATE (KETONES)
~~LOC~~ BKR BETA HYDROXYBUTYRATE: 0.2 mmol/L (ref <0.3)
~~LOC~~ BKR BETA HYDROXYBUTYRATE: 0.2 mmol/L (ref <0.3)
~~LOC~~ BKR BETA HYDROXYBUTYRATE: 0.2 mmol/L (ref <0.3)

## 2023-12-02 LAB — BARBITURATES-URINE RANDOM: ~~LOC~~ BKR BARBITURATES: NEGATIVE

## 2023-12-02 LAB — CREATININE-URINE RANDOM: ~~LOC~~ BKR UR CREATININE, RAN: 92 mg/dL

## 2023-12-02 LAB — SODIUM-URINE RANDOM: ~~LOC~~ BKR UR SODIUM, RAN: 29 mmol/L

## 2023-12-02 LAB — COCAINE-URINE RANDOM: ~~LOC~~ BKR COCAINE: NEGATIVE

## 2023-12-02 LAB — PROTIME INR (PT): ~~LOC~~ BKR PROTIME: 13 s (ref 9.9–14.2)

## 2023-12-02 LAB — PHENCYCLIDINES-URINE RANDOM: ~~LOC~~ BKR PHENCYCLIDINE (PCP): NEGATIVE

## 2023-12-02 LAB — CBC AND DIFF: ~~LOC~~ BKR MDW (MONOCYTE DISTRIBUTION WIDTH): 26 — ABNORMAL HIGH (ref 3–<=20.6)

## 2023-12-02 LAB — BENZODIAZEPINES-URINE RANDOM: ~~LOC~~ BKR BENZODIAZEPINES: NEGATIVE

## 2023-12-02 LAB — C DIFFICILE BY PCR: ~~LOC~~ BKR C DIFF TOXIN B: NEGATIVE

## 2023-12-02 LAB — MAGNESIUM  CELLULAR THERAPEUTICS: ~~LOC~~ BKR MAGNESIUM: 2 mg/dL — ABNORMAL HIGH (ref 1.6–2.6)

## 2023-12-02 LAB — CULTURE - RESPIRATORY

## 2023-12-02 LAB — CREATINE KINASE-CPK
~~LOC~~ BKR CK TOTAL: 253 U/L — ABNORMAL HIGH (ref 35–232)
~~LOC~~ BKR CK TOTAL: 312 U/L — ABNORMAL HIGH (ref 35–232)

## 2023-12-02 LAB — HIGH SENSITIVITY TROPONIN I, RANDOM: ~~LOC~~ BKR HIGH SENSITIVITY TROPONIN I: 209 ng/L — ABNORMAL HIGH (ref 26.0–<20)

## 2023-12-02 LAB — CULTURE-URINE W/SENSITIVITY

## 2023-12-02 LAB — UREA NITROGEN-URINE RANDOM: ~~LOC~~ BKR UR UREA NIT, RAN: 210 mg/dL

## 2023-12-02 MED ORDER — SODIUM CHLORIDE 0.9% IV BOLUS
500 mL | Freq: Once | INTRAVENOUS | 0 refills | Status: CP
Start: 2023-12-02 — End: ?
  Administered 2023-12-02: 08:00:00 500 mL via INTRAVENOUS

## 2023-12-02 MED ORDER — SODIUM CHLORIDE 0.9% IV BOLUS
500 mL | Freq: Once | INTRAVENOUS | 0 refills | Status: CP
Start: 2023-12-02 — End: ?

## 2023-12-02 MED ORDER — ASPIRIN 81 MG PO CHEW
81 mg | Freq: Every day | GASTROSTOMY | 0 refills | Status: DC
Start: 2023-12-02 — End: 2023-12-07
  Administered 2023-12-02 – 2023-12-06 (×5): 81 mg via GASTROSTOMY

## 2023-12-02 MED ORDER — CLOPIDOGREL 75 MG PO TAB
75 mg | Freq: Every day | GASTROSTOMY | 0 refills | Status: DC
Start: 2023-12-02 — End: 2023-12-07
  Administered 2023-12-02 – 2023-12-06 (×5): 75 mg via GASTROSTOMY

## 2023-12-02 MED ORDER — SENNOSIDES 8.8 MG/5 ML PO SYRP
8.8 mg | Freq: Two times a day (BID) | ORAL | 0 refills | Status: DC
Start: 2023-12-02 — End: 2023-12-02

## 2023-12-02 MED ORDER — POLYETHYLENE GLYCOL 3350 17 GRAM PO PWPK
1 | Freq: Every day | GASTROSTOMY | 0 refills | Status: DC
Start: 2023-12-02 — End: 2023-12-07

## 2023-12-02 MED ORDER — SODIUM CHLORIDE 0.9 % IJ SOLN
10 mL | Freq: Once | INTRAVENOUS | 0 refills | Status: CP
Start: 2023-12-02 — End: ?
  Administered 2023-12-02: 13:00:00 10 mL via INTRAVENOUS

## 2023-12-02 MED ORDER — POLYETHYLENE GLYCOL 3350 17 GRAM PO PWPK
1 | Freq: Every day | ORAL | 0 refills | Status: DC
Start: 2023-12-02 — End: 2023-12-02

## 2023-12-02 MED ORDER — CLOPIDOGREL 75 MG PO TAB
75 mg | Freq: Every day | ORAL | 0 refills | Status: DC
Start: 2023-12-02 — End: 2023-12-02

## 2023-12-02 MED ORDER — TUBE FEED (ENAR)
0 refills | Status: DC
Start: 2023-12-02 — End: 2023-12-05

## 2023-12-02 MED ORDER — PERFLUTREN LIPID MICROSPHERES 1.1 MG/ML IV SUSP
1-10 mL | Freq: Once | INTRAVENOUS | 0 refills | Status: CP | PRN
Start: 2023-12-02 — End: ?
  Administered 2023-12-02: 13:00:00 2 mL via INTRAVENOUS

## 2023-12-02 MED ORDER — ACETAMINOPHEN 325 MG PO TAB
650 mg | GASTROSTOMY | 0 refills | Status: DC | PRN
Start: 2023-12-02 — End: 2023-12-07
  Administered 2023-12-02 – 2023-12-06 (×8): 650 mg via GASTROSTOMY

## 2023-12-02 MED ORDER — LORAZEPAM 2 MG/ML IJ SOLN GROUP
2 mg | Freq: Once | INTRAVENOUS | 0 refills | Status: CP
Start: 2023-12-02 — End: ?
  Administered 2023-12-02: 09:00:00 2 mg via INTRAVENOUS

## 2023-12-02 MED ORDER — HEPARIN, PORCINE (PF) 5,000 UNIT/0.5 ML IJ SYRG
5000 [IU] | SUBCUTANEOUS | 0 refills | Status: DC
Start: 2023-12-02 — End: 2023-12-07
  Administered 2023-12-02 – 2023-12-06 (×12): 5000 [IU] via SUBCUTANEOUS

## 2023-12-02 MED ORDER — SENNOSIDES 8.8 MG/5 ML PO SYRP
8.8 mg | Freq: Two times a day (BID) | GASTROSTOMY | 0 refills | Status: DC
Start: 2023-12-02 — End: 2023-12-07

## 2023-12-02 MED ORDER — ACETAMINOPHEN 325 MG PO TAB
650 mg | ORAL | 0 refills | Status: DC | PRN
Start: 2023-12-02 — End: 2023-12-02

## 2023-12-02 MED ORDER — SODIUM CHLORIDE 0.9% IV BOLUS
500 mL | Freq: Once | INTRAVENOUS | 0 refills | Status: CP
Start: 2023-12-02 — End: ?
  Administered 2023-12-02: 07:00:00 500 mL via INTRAVENOUS

## 2023-12-02 MED ORDER — THIAMINE MONONITRATE (VIT B1) 100 MG PO TAB
100 mg | Freq: Every day | GASTROSTOMY | 0 refills | Status: DC
Start: 2023-12-02 — End: 2023-12-07
  Administered 2023-12-02 – 2023-12-06 (×5): 100 mg via GASTROSTOMY

## 2023-12-02 MED ORDER — NOREPINEPHRINE BITARTRATE-D5W 4 MG/250 ML (16 MCG/ML) IV SOLN
0-.5 ug/kg/min | INTRAVENOUS | 0 refills | Status: DC
Start: 2023-12-02 — End: 2023-12-06
  Administered 2023-12-02: 07:00:00 0.02 ug/kg/min via INTRAVENOUS
  Administered 2023-12-03: 01:00:00 0.12 ug/kg/min via INTRAVENOUS
  Administered 2023-12-03: 11:00:00 0.1 ug/kg/min via INTRAVENOUS
  Administered 2023-12-04: 03:00:00 0.05 ug/kg/min via INTRAVENOUS
  Administered 2023-12-05: 03:00:00 0.03 ug/kg/min via INTRAVENOUS

## 2023-12-02 MED ADMIN — SODIUM CHLORIDE 0.9% IV SOLP [27838]: 250 mL | INTRAVENOUS | @ 11:00:00 | Stop: 2023-12-02 | NDC 00338004902

## 2023-12-02 MED ADMIN — SODIUM CHLORIDE 0.9% IV SOLP [27838]: 500 mL | INTRAVENOUS | @ 16:00:00 | Stop: 2023-12-02 | NDC 00338004904

## 2023-12-02 NOTE — Procedures
 INPATIENT >1 HOUR EEG REPORT    Kevin Obrien  13-Oct-1950  [id number]    Date of service: 12/02/23    History: This is a 73 y.o. male presenting with pontomedullary stroke, sepsis, and encephalopathy.    Pertinent medications: propofol  gtt    Introduction:  This study was performed using digital electroencephalographic recording equipment. International 10-20 electrode placement was used along with FT9 and FT10 electrodes. The record was obtained with the patient in the coma state.  Photic stimulation is performed.  Hyperventilation is not performed.  The study is performed from 0908 to 1014 on 12/02/23.    Description:   The posterior dominant rhythm is not present.  The EEG is further described as containing a reactive and intermittently discontinuous background consisting of generalized delta>theta frequencies with drowsiness causing low-amplitude attenuations lasting 2-3 seconds and bursts of generalized theta activity lasting up to 4 seconds.     No sleep architecture is seen.    There are frequent left>right leg as well as occasional upper extremity twitches seen without electrographic correlate. At [phone] there is an episode of repetitive left arm movement (shoulder abduction) that is also without electrographic correlate.    Technical interpretation:   This EEG is abnormal due to generalized slowing in a burst-attenuation pattern.     Clinical correlation:   This abnormal >1 hour EEG is indicative of a moderate-severe encephalopathy at least in part due to sedating medication effect, though multifocal, toxic/metabolic, anoxic, and degenerative etiologies can be considered. Clinical correlation is recommended.  There is no electrographic correlate to leg and arm movements seen during the recording.    Alyce Dancer, DO  Clinical Epilepsy Fellow  Available on Voalte (preferred)  Pager [phone]

## 2023-12-02 NOTE — Progress Notes
 Adult Mechanical Ventilator Liberation    Name: Kevin Obrien   MRN: [id number]     DOB: 03-03-1951      Age: 73 y.o.  Admission Date: 12/01/2023     LOS: 1 day     Date of Service: 12/02/2023        Adult Mechanical Ventilator Liberation: Twice daily     Weaning Readiness Screen Met (RT Only):: No, no evidence of improvement of reversal of cause of respiratory failure (e.g. fluid overload, pneumonia, ARDS, etc.)

## 2023-12-02 NOTE — Progress Notes
 Principal Problem:    Acute respiratory failure (CMS-HCC)  Active Problems:    Essential hypertension    Dyslipidemia    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)    Ischemic stroke (CMS-HCC)    Acute encephalopathy    On mechanically assisted ventilation (CMS-HCC)    Aspiration pneumonia (CMS-HCC)    NSTEMI (non-ST elevated myocardial infarction) (CMS-HCC)    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 (CMS-HCC)    Anemia    History of gastrointestinal diverticular hemorrhage    Acute kidney injury    Elevated liver enzymes    Coronary artery disease    History of coronary artery stent placement    Diarrhea    Elevated creatine kinase level    Lactic acidosis    Sepsis, unspecified organism (CMS-HCC)    Elevated brain natriuretic peptide (BNP) level    Prolonged QT interval     Kevin Obrien is a 73 y.o. male with PMH of recent Acute R pontomedullary infarct, CAD s/p PCI, hx GI bleed, DM2 admitted from SNF on 6/22 with acute/subacute large right occipitotemporal and left temporoparietal occipital stroke, aspiration pneumonia, AKI, and septic shock. On vent support with acute encephalopathy needing airway protection. Concern for NSTEMI with regional wall motion abnormality on echo. Trop trending down. On ASA and plavix . Given trop trending down we are holding on starting heparin  drip for now. Risk of heparin  drip outweight benefit at this point with risk of hemorrhagic conversion. Will not be candidate for cardiac cath at this time with current sepsis.  Continue zosyn  and linezolid  for pneumonia. Continue vasopressor for BP targets as per neuro team. Fluid resuscitation via NICOM. Oliguric renal failure likely secondary to sepsis. Monitor chemistry and trend CPK. Overall prognosis appears poor which I discussed with the family. Discussions regarding goals of care ongoing.     The patient is critically ill with problems listed above.   I spent 120 mins time in addition to time spent by the NP performing critical care services including radiology review, medication and record review, abx review, vasopressor mngt, fluid and electrolyte mngt, serial assessment and coordination of care.     [name], MD

## 2023-12-02 NOTE — Unmapped
 Procedure Note    Kevin Obrien is a 73 y.o. male.      Arterial Line    Date/Time: 12/02/2023 5:03 AM    Performed by: Elsa Chauncey RAMAN, APRN-NP  Authorized by: Elsa Chauncey RAMAN, APRN-NP  Relevant documents: relevant documents present and verified  Test results: test results available and properly labeled  Site marked: the operative site was marked  Imaging studies: imaging studies available  Required items: required blood products, implants, devices, and special equipment available  Patient identity confirmed: arm band, hospital-assigned identification number and provided demographic data  Time out: Immediately prior to procedure a time out was called to verify the correct patient, procedure, equipment, support staff and site/side marked as required.  Preparation: Patient was prepped and draped in the usual sterile fashion.  Indications: hemodynamic monitoring  Location: right radial    Sedation:  Patient sedated: yes  Sedation type: Continuously sedated under mechanical ventilation protocol  Allen's test normal: yes  Needle gauge: 20  Seldinger technique: Seldinger technique used  Number of attempts: 1  Post-procedure: dressing applied  Post-procedure CMS: normal and unchanged  Patient tolerance: patient tolerated the procedure well with no immediate complications              Chauncey RAMAN Elsa, APRN-NP

## 2023-12-02 NOTE — Consults
 Neurology Consult Note  Name: Kevin Obrien   MRN: [id number]     DOB: 04-04-1951      Age: 73 y.o.  Admission Date: 12/01/2023     LOS: 1 day     Date of Service: 12/02/2023       Reason for Consult:      New bilateral subacute CVAs, recent pontomedullary CVA     Consult type: Co-Management w/Signed Orders    Assessment: Kevin Obrien is a 73 y.o.  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    Assessment & Plan  Acute respiratory failure (CMS-HCC)    Essential hypertension    Dyslipidemia    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)    Ischemic stroke (CMS-HCC)    Acute encephalopathy    On mechanically assisted ventilation (CMS-HCC)    Aspiration pneumonia (CMS-HCC)    NSTEMI (non-ST elevated myocardial infarction) (CMS-HCC)    Anemia    Acute kidney injury    Elevated liver enzymes      Present on Admission:   Acute respiratory failure (CMS-HCC)   Ischemic stroke (CMS-HCC)   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)   Essential hypertension   Dyslipidemia    Neuroexam: GCS 3, intubated pinpoint pupils sluggishly reactive, corneal intact, unable to perform cough/gag.  No spontaneous movements or grimace to noxious stimulation.  Diffuse hyperreflexia with triple flexion bilaterally.    Neuroimaging:    CT head W/O: Development of large subacute appearing bilateral occipitotemporal and moderate sized right corona radiata/capsular/basal ganglia infarcts. No evidence of hemorrhagic conversion. No midline shift or herniation.  Stable chronic pontomedullary infarcts, left basal gallbladder-capsular infarct, and old lacunar infarcts, better demonstrated on prior MRI.     Labs: (on presentation)  - WBC 11, Hgb 7.7, Na 128 (corrected 138), HCO3 21, AG 15, creatinine 4.3, glucose, LA 2.6    Impression: Patient with a recent pontomedullary stroke and extensive ICAD who presents with stepwise, slowly progressive encephalopathy over the last week in the context of acute blood loss anemia and SIRS/sepsis.  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  However, with the extent of these strokes as well as the systemic manifestations, other etiologies might be infectious (endocarditis?),  Or hematological.     Recommendations  > For the acute management;   - The timing of strokes is unclear, but most likely 3-7 days out.  Neuroimaging is reassuring as there is no mass effect for now, favoring a delayed presentation of these strokes.   - However, hyperglycemia might be masking the impact of cerebral edema, and he might have rebound cerebral edema as blood sugars corrected due to osmolar pressure changes  -Recommend lowering blood sugar gently over the next 2 to 3 hours, once it's less than 200, please obtain stability CT head noncontrast to decide on osmolar therapy would recommend keeping corrected sodium around 140 for now       - maintain normothermia - consider scheduled Tylenol  - [fever's increase cerebral metabolic demand]       - keep head-of-bed elevated 30, keep head/neck midline to improve venous return via jugular  veins, avoid C-collars or jugular central lines if possible       - scheduled laxatives to reduce constipation - consider scheduled senna/docusate/miralax /Milk of Mag  > Management of the undifferentiated shock per  ICU  - Recommend aggressive hydration  - SBP goals of 120-130  - HgB > 8 (as part of his stroke pathophysiology is hypoperfusion)  > Please obtain MRI head WO and MRA H/N  > Stroke workup to include TTE with bubble studies A1c, and FLP  > Consider obtaining 3 sets of blood cultures to rule out endocarditis  > Will consider infectious/hematological workup depending on the initial workup and clinical trajectory  > Continue Plavix  75 mg daily for now (if no active life-threatening GI bleeding)  > Prognosis is guarded                Thank you for allowing us  to participate in the care of your patient. Please contact Neurology team 1 on Voalte with any questions or concerns.    Patient was discussed with Dr. Mamie over the phone, to be staffed formally next a.m.     Llana Deshazo Q. Francie, MD  PGY-4 neurology   [phone], available on Voalte     This note was composed with the aid of voice-to-text software transcription Dragon. Please pardon any errors in spelling or grammar.   ______________________________________________________________________________________________  Subjective    Chief complaint confusion    History Of Present Illness  Kevin Obrien is a 73 y.o.  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    History was taken from the family at bedside. Patient was discharged from the stroke service on 5/19.  Since discharge, he been having fluctuating fatigue and encephalopathy.  He was then admitted in 6/12 due to worsening hematochezia and UTI.  His CT head on 6/16 was normal, it seems that during the admission, his mentation have been slowly getting worse especially after 6/19, but he was waking up momentarily, and making some contractions even if he was confused. He was [facility] on 6/[facility] at SNF, but since discharge he had continuously declining mentation and oxygen requirement.  He was intubated for airway protection.  Evaluated patient at bedside, while propofol  was off.  He was not arousable, pupils are sluggishly reactive.  No withdrawal to pain, bilateral triple flexion    Medical/Surgical/Social/Family History{  Past Medical History:    Accidental fall    Angina pectoris    Arthritis    BPH (benign prostatic hyperplasia)    Cataract    Constipation    Coronary artery disease    DM (diabetes mellitus) (CMS-HCC)    ED (erectile dysfunction)    Heart attack (CMS-HCC)    Hyperlipidemia    Hypertension    Joint pain    Neuropathy    Shingles     Surgical History:   Procedure Laterality Date    CORONARY STENT PLACEMENT  2016    HX CHOLECYSTECTOMY  09/2015    COLONOSCOPY N/A 04/20/2016    Performed by Rogers Kipper, MD at [facility] ENDO    ESOPHAGOGASTRODUODENOSCOPY N/A 04/20/2016    Performed by Rogers Kipper, MD at [facility] ENDO    ESOPHAGOGASTRODUODENOSCOPY BIOPSY  04/20/2016    Performed by Rogers Kipper, MD at [facility] ENDO  UREA BREATH TEST N/A 11/13/2016    Performed by Ledora Catalina, MD at [facility] ENDO    ESOPHAGOGASTRODUODENOSCOPY WITH BIOPSY - FLEXIBLE N/A 11/22/2023    Performed by Bonnetta Camellia BROCKS, MD at [facility] ENDO    COLONOSCOPY WITH BIOPSY - FLEXIBLE N/A 11/22/2023    Performed by Bonnetta Camellia BROCKS, MD at [facility] ENDO     Social History     Tobacco Use    Smoking status: Former     Current packs/day: 0.00     Average packs/day: 2.0 packs/day for 10.0 years (20.0 ttl pk-yrs)     Types: Cigarettes     Start date: 04/11/1961     Quit date: 04/12/1971     Years since quitting: 52.6    Smokeless tobacco: Never    Tobacco comments:     Former smoker/quit years ago   Vaping Use    Vaping status: Never Used   Substance and Sexual Activity    Alcohol use: Not Currently    Drug use: Never    Sexual activity: Not Currently     Partners: Female     Birth control/protection: None           Family History   Problem Relation Name Age of Onset    Diabetes Mother AMPARO CUBIAS     Diabetes Father JOSE Zogg     Cancer-Lung Father JOSE Siddall Melanoma Neg Hx      Cataract Neg Hx      Glaucoma Neg Hx      Macular Degen Neg Hx       Allergies: Seasonal allergies  Current Medications    Medication Directions   acetaminophen  (TYLENOL ) 325 mg tablet Take two tablets via feeding tube every 6 hours as needed for Pain. Indications: pain  Patient taking differently: two tablets by Per G Tube route every 6 hours as needed for Pain. Indications: pain   amLODIPine  (NORVASC ) 5 mg tablet one tablet by Per NG tube route daily. Indications: high blood pressure  Patient taking differently: one tablet by Per G Tube route daily. Indications: high blood pressure   amoxicillin /K clavulanate (AUGMENTIN ) 400 mg/5 mL oral suspension 10.9 mL by Per NG tube route twice daily for 7 days. Take with food.  Indications: a lower respiratory infection  Patient taking differently: 875 mg by Per G Tube route twice daily. Take with food.  Indications: a lower respiratory infection   artificial tears (PF) single dose ophthalmic solution Apply two drops to both eyes three times daily. Indications: dry eye   aspirin  81 mg chewable tablet one tablet by Per NG tube route daily. Indications: stroke prevention  Patient taking differently: one tablet by Per G Tube route daily. Indications: stroke prevention   clopiDOGreL  (PLAVIX ) 75 mg tablet one tablet by Per NG tube route daily. Indications: prevention for a blood clot going to the brain  Patient taking differently: one tablet by Per G Tube route daily. Indications: prevention for a blood clot going to the brain   dextrose  50% (D50) syringe Administer 25-50 mL through vein as Needed (=< 70 mg/dL: See admin instructions). Indications: hypoglycemia   diclofenac  sodium (VOLTAREN ) 1 % topical gel Apply two g topically to affected area four times daily. Indications: pain   doxazosin  (CARDURA ) 4 mg tablet Take one tablet via feeding tube at bedtime daily. Indications: enlarged prostate with urination problem  Patient taking differently: one tablet by Per G Tube route at bedtime daily. Indications: enlarged prostate with urination problem  insulin  aspart (U-100) (NOVOLOG  FLEXPEN U-100 INSULIN ) 100 unit/mL (3 mL) PEN Inject twelve Units under the skin four times daily. PLUS Correction Factor if: BS 181-220=2 units, BS 221-260=4 units, BS 261-300=6 units, BS 301-350=8 units, BS 351-400=10 units, BS >400=12 units  Indications: type 2 diabetes mellitus   insulin  glargine (LANTUS  SOLOSTAR U-100 INSULIN ) 100 unit/mL (3 mL) subcutaneous PEN Inject twenty Units under the skin daily. Indications: type 2 diabetes mellitus  Patient taking differently: Inject twenty Units under the skin at bedtime daily. Indications: type 2 diabetes mellitus   Lactose-Free Food with Fiber (JEVITY 1.5 CAL) 0.06 gram-1.5 kcal/mL liqd Take 445 mL by mouth four times daily.   lidocaine  (LIDODERM ) 5 % topical patch Apply one patch topically to affected area daily. Apply patch for 12 hours, then remove for 12 hours before repeating.  Indications: neuropathic pain   lidocaine  hcl (XYLOCAINE ) 2 % mucosal jelly Apply  topically to affected area as Needed. Indications: local anesthesia for urethral pain  Patient taking differently: Apply  topically to affected area every 24 hours as needed. Indications: local anesthesia for urethral pain   melatonin 5 mg tablet Take one tablet via feeding tube at bedtime daily. Indications: difficulty sleeping  Patient taking differently: one tablet by Per G Tube route at bedtime daily. Indications: difficulty sleeping   mirtazapine  (REMERON ) 15 mg tablet Take one tablet via feeding tube at bedtime daily. Indications: major depressive disorder  Patient taking differently: one tablet by Per G Tube route at bedtime daily. Indications: major depressive disorder   modafiniL  (PROVIGIL ) 100 mg tablet Take two tablets via feeding tube daily AND one tablet daily. Indications: post-stroke sleepiness   petrolatum  (STYE) 57.7-31.9 % oint ophthalmic ointment Apply one-quarter inch to both eyes three times daily. Indications: dry eye   phenoL (CHLORASEPTIC) 1.4 % mouth spray Take two sprays by mouth or throat as directed as Needed. Indications: irritation of the mouth  Patient taking differently: Take two sprays by mouth or throat as directed every 4 hours as needed. Indications: irritation of the mouth   propranoloL  (INDERAL ) 10 mg tablet one tablet by Per NG tube route twice daily. Indications: high blood pressure  Patient taking differently: one tablet by Per G Tube route twice daily. Indications: high blood pressure   rosuvastatin  (CRESTOR ) 40 mg tablet Take one tablet via feeding tube daily. Indications: excessive fat in the blood  Patient taking differently: one tablet by Per G Tube route at bedtime daily. Indications: excessive fat in the blood   sennosides-docusate sodium  (SENOKOT-S) 8.6/50 mg tablet one tablet by Per G Tube route daily as needed (Constipation - First Line). Indications: constipation   thiamine  mononitrate (vit B1) 100 mg tablet one tablet by Per NG tube route daily. Indications: deficiency in thiamine  or vitamin B1  Patient taking differently: one tablet by Per G Tube route daily. Indications: deficiency in thiamine  or vitamin B1   trazodone (#) (DESYREL ) 10 mg/mL Take 1.25ml (12.5 mg) twice daily as needed and 2.5ml (25 mg) nightly at bed time  Indications: PRN for agitation       Objective                        Vital Signs: Last Filed                 Vital Signs: 24 Hour Range   BP: 91/49 (06/22 2322)  Temp: 37.6 ?C (99.7 ?F) (06/22 2322)  Pulse: 86 (06/22 2322)  Respirations: 23  PER MINUTE (06/22 2322)  SpO2: 97 % (06/22 2322)  O2%: 40 % (06/22 2300)  O2 Device: Ventilator (06/22 2300)  O2 Liter Flow: 15 Lpm (06/22 1720)  Height: 172.7 cm (5' 8) (06/22 1720) BP: (91-128)/(49-67)   Temp:  [36.5 ?C (97.7 ?F)-37.6 ?C (99.7 ?F)]   Pulse:  [86-109]   Respirations:  [18 PER MINUTE-29 PER MINUTE]   SpO2:  [92 %-100 %]   O2%:  [40 %-60 %]   O2 Device: Ventilator  O2 Liter Flow: 15 Lpm     Vitals:    12/01/23 1720   Weight: 63.5 kg (139 lb 15.9 oz)         Intake/Output Summary:  (Last 24 hours)    Intake/Output Summary (Last 24 hours) at 12/02/2023 0003  Last data filed at 12/01/2023 2300  Gross per 24 hour   Intake 2233.28 ml   Output 235 ml   Net 1998.28 ml     Stool Occurrence: 1        Physical Exam  General physical exam:  HEENT: normocephalic  CV: Tachycardic  Chest: Intubated      Physical Exam:    Blood pressure 91/49, pulse 86, temperature 37.6 ?C (99.7 ?F), height 172.7 cm (5' 8), weight 63.5 kg (139 lb 15.9 oz), SpO2 97%.    Glasgow coma score:              E: 1 - Does not open eyes          M: 1 - No motor response to pain              V: 1 - Makes no noise      Neuro: (Performed after intubation and propofol  being off for 20 minutes)   Mental Status: GCS 3, not moving to noxious stimuli   Cranial Nerves: Cranial nerves 2-12 INTACT by inspection.      - Pupil exam: Size: Pinpoint, sluggishly reactive    - EOM: No doll's eye                 - Corneal reflex: R - present L - present    - Grimace/facial movement: absent      - Gag reflex: NA (due to type of the ET tube at the time of my assessment)    Motor:         RUE: Strength: 0/5                     RLE: Strength: 0/5            LUE: Strength: 0/5         LLE: Strength: 0/5   Sensory: No grimacing/withdrawal to noxious stimulation   Coordination: NA   DTRs: +3 allover, triple flexion bilaterally   Gait: NA      Lab/Radiology/Other Diagnostic Tests:  Results for orders placed or performed during the hospital encounter of 12/01/23 (from the past 24 hours)   POC GLUCOSE    Collection Time: 12/01/23  5:19 PM   Result Value Ref Range    Glucose, POC 489 (H) 70 - 100 mg/dL   POC CREATININE    Collection Time: 12/01/23  5:21 PM   Result Value Ref Range    Creatinine, POC 4.6 (H) 0.4 - 1.24 mg/dL   POC LACTATE    Collection Time: 12/01/23  5:22 PM   Result Value Ref Range    LACTIC ACID POC 2.2 (H) 0.5 - 2.0  mmol/L   POC BLOOD GAS VEN    Collection Time: 12/01/23  5:23 PM   Result Value Ref Range    PH-VEN-POC 7.38 7.30 - 7.40    PCO2-VEN-POC 35 (L) 36 - 50 mm[Hg]    PO2-VEN-POC 29 (L) 33 - 48 mm[Hg]    Base Def-VEN-POC 4 mmol/L    O2 Sat-VEN-POC 55 55 - 71 %    Bicarbonate-VEN- POC 21 mmol/L   POC HEMATOCRIT&HEMOGLOBIN    Collection Time: 12/01/23  5:23 PM   Result Value Ref Range    Hemoglobin POC 9.5 (L) 13.5 - 16.5 g/dL    Hematocrit POC 28 (L) 40 - 50 %   POC SODIUM    Collection Time: 12/01/23  5:23 PM   Result Value Ref Range    SODIUM-POC 129 (L) 137 - 147 mmol/L   POC POTASSIUM    Collection Time: 12/01/23  5:23 PM   Result Value Ref Range    Potassium-POC 4.2 3.5 - 5.1 mmol/L   POC BLOOD GAS VEN    Collection Time: 12/01/23  5:25 PM   Result Value Ref Range    PH-VEN-POC 7.40 7.30 - 7.40    PCO2-VEN-POC 34 (L) 36 - 50 mm[Hg]    PO2-VEN-POC 34 33 - 48 mm[Hg]    Base Def-VEN-POC 4 mmol/L    O2 Sat-VEN-POC 66 55 - 71 %    Bicarbonate-VEN- POC 21 mmol/L   POC HEMATOCRIT&HEMOGLOBIN    Collection Time: 12/01/23  5:25 PM   Result Value Ref Range    Hemoglobin POC 7.1 (L) 13.5 - 16.5 g/dL    Hematocrit POC 21 (L) 40 - 50 %   POC SODIUM    Collection Time: 12/01/23  5:25 PM   Result Value Ref Range    SODIUM-POC 128 (L) 137 - 147 mmol/L   POC POTASSIUM    Collection Time: 12/01/23  5:25 PM   Result Value Ref Range    Potassium-POC 4.2 3.5 - 5.1 mmol/L   CBC AND DIFF    Collection Time: 12/01/23  5:29 PM   Result Value Ref Range    White Blood Cells 11.00 4.50 - 11.00 10*3/uL    Red Blood Cells 2.52 (L) 4.40 - 5.50 10*6/uL    Hemoglobin 7.7 (L) 13.5 - 16.5 g/dL    Hematocrit 78.4 (L) 40.0 - 50.0 %    MCV 85.1 80.0 - 100.0 fL    MCH 30.6 26.0 - 34.0 pg    MCHC 35.9 32.0 - 36.0 g/dL    RDW 86.1 88.9 - 84.9 %    Platelet Count 165 150 - 400 10*3/uL    MPV 9.9 7.0 - 11.0 fL    Neutrophils 84.6 (H) 41.0 - 77.0 %    Lymphocytes 7.9 (L) 24.0 - 44.0 %    Monocytes 7.2 4.0 - 12.0 %    Eosinophils 0.0 0.0 - 5.0 %    Basophils 0.3 0.0 - 2.0 % Absolute Neutrophil Count 9.30 (H) 1.80 - 7.00 10*3/uL    Absolute Lymph Count 0.90 (L) 1.00 - 4.80 10*3/uL    Absolute Monocyte Count 0.80 0.00 - 0.80 10*3/uL    Absolute Eosinophil Count 0.00 0.00 - 0.45 10*3/uL    Absolute Basophil Count 0.00 0.00 - 0.20 10*3/uL    MDW (Monocyte Distribution Width) 29.5 (H) <=20.6   COMPREHENSIVE METABOLIC PANEL    Collection Time: 12/01/23  5:29 PM   Result Value Ref Range    Sodium 128 (L) 137 - 147  mmol/L    Potassium 4.3 3.5 - 5.1 mmol/L    Chloride 92 (L) 98 - 110 mmol/L    Glucose 557 (HH) 70 - 100 mg/dL    Blood Urea Nitrogen 80 (H) 7 - 25 mg/dL    Creatinine 5.62 (H) 0.40 - 1.24 mg/dL    Calcium 7.9 (L) 8.5 - 10.6 mg/dL    Total Protein 6.3 6.0 - 8.0 g/dL    Total Bilirubin 0.4 0.2 - 1.3 mg/dL    Albumin 2.9 (L) 3.5 - 5.0 g/dL    Alk Phosphatase 69 25 - 110 U/L    AST 77 (H) 7 - 40 U/L    ALT 102 (H) 7 - 56 U/L    CO2 21 21 - 30 mmol/L    Anion Gap 15 (H) 3 - 12    Glomerular Filtration Rate (GFR) 13 (L) >60 mL/min   TYPE & CROSSMATCH    Collection Time: 12/01/23  5:29 PM   Result Value Ref Range    ABO/RH(D) AB POS     Antibody Screen NEG     Crossmatch Expires 12/04/2023,2359     Units Ordered 1     Electronic Crossmatch YES     Record Check FOUND    NT-PRO-BNP    Collection Time: 12/01/23  5:29 PM   Result Value Ref Range    NT-Pro-BNP 7,292 (H) <450 pg/mL   MAGNESIUM     Collection Time: 12/01/23  5:29 PM   Result Value Ref Range    Magnesium  2.0 1.6 - 2.6 mg/dL   LIPASE     Collection Time: 12/01/23  5:29 PM   Result Value Ref Range    Lipase  17 11 - 82 U/L   HIGH SENSITIVITY TROPONIN I 0 HOUR    Collection Time: 12/01/23  5:29 PM   Result Value Ref Range    hs Troponin I 0 Hour 2,778 (H) <20 ng/L   BETA HYDROXYBUTYRATE (KETONES)    Collection Time: 12/01/23  5:29 PM   Result Value Ref Range    Beta Hydroxybutyrate 0.2 <0.3 mmol/L   PREPARE RBC - SUNQUEST    Collection Time: 12/01/23  5:29 PM   Result Value Ref Range    Units Ordered 1     Crossmatch Expires 12/04/2023,2359     Record Check FOUND     ABO/RH(D) AB POS     Antibody Screen NEG     Electronic Crossmatch YES     Unit Number [id number]     Blood Component Type RBC,ADSOL,LEUKO REDUCED     Unit Division 00     Status OF Unit ISSUED     ISSUE DATE TIME [id number]     PRODUCT CODE [id number]     BLOOD TYPE A POS     CODING STATUS 6200     BLOOD EXPIRATION DATE [id number]     Transfusion Status OK TO TRANSFUSE     Crossmatch Result COMPATIBLE,ELECTRONIC    CONFIRMATION BLOOD TYPE    Collection Time: 12/01/23  5:34 PM   Result Value Ref Range    ABO/RH(D) AB POS    BETA HYDROXYBUTYRATE (KETONES)    Collection Time: 12/01/23  6:27 PM   Result Value Ref Range    Beta Hydroxybutyrate 0.2 <0.3 mmol/L   CREATINE KINASE-CPK    Collection Time: 12/01/23  6:27 PM   Result Value Ref Range    Creatine Kinase 2,533 (H) 35 - 232 U/L   HIGH SENSITIVITY TROPONIN I 2 HOUR  Collection Time: 12/01/23  7:23 PM   Result Value Ref Range    hs Troponin I 2 Hour 2,309 (H) <20 ng/L    hs Troponin I 2-0hr Delta Value -469    ACETAMINOPHEN  LEVEL    Collection Time: 12/01/23  7:23 PM   Result Value Ref Range    Acetaminophen  <10.0 <=20.0 ?g/mL   BETA HYDROXYBUTYRATE (KETONES)    Collection Time: 12/01/23  7:23 PM   Result Value Ref Range    Beta Hydroxybutyrate 0.2 <0.3 mmol/L   POC GLUCOSE    Collection Time: 12/01/23  7:26 PM   Result Value Ref Range    Glucose, POC 478 (H) 70 - 100 mg/dL   URINALYSIS DIPSTICK REFLEX TO CULTURE    Collection Time: 12/01/23  7:29 PM    Specimen: Midstream; Urine   Result Value Ref Range    Color,UA Amber     Turbidity,UA 2+ (A) Clear    Specific Gravity-Urine 1.022 1.005 - 1.030    pH,UA 5.0 5.0 - 8.0    Protein,UA 3+ (A) Negative    Glucose,UA 3+ (A) Negative    Ketones,UA Negative Negative    Bilirubin,UA Negative Negative    Blood,UA 2+ (A) Negative    Urobilinogen,UA Normal Normal    Nitrite,UA Negative Negative    Leukocytes,UA Trace (A) Negative   URINALYSIS MICROSCOPIC REFLEX TO CULTURE Collection Time: 12/01/23  7:29 PM    Specimen: Midstream; Urine   Result Value Ref Range    WBCs,UA 20 - 50 (A) None, 0 - 2  /HPF    RBCs,UA 11 - 20 (A) None, 0 - 2  /HPF    Mucous,UA Trace None, Trace /LPF    Bacteria,UA Many (A) None /HPF    Amorphous Sediment,UA Few (A) None /HPF    Hyaline Cast 2 - 5 (A) None, 0 - 2  /LPF    Transitional Epithelial 0 - 2 None, 0 - 2  /HPF   POC LACTATE    Collection Time: 12/01/23  7:29 PM   Result Value Ref Range    LACTIC ACID POC 2.4 (H) 0.5 - 2.0 mmol/L   SODIUM-URINE RANDOM    Collection Time: 12/01/23  7:29 PM   Result Value Ref Range    Urine Sodium 29 mmol/L   CREATININE-URINE RANDOM    Collection Time: 12/01/23  7:29 PM   Result Value Ref Range    Urine Creatinine 92 mg/dL   UREA NITROGEN-URINE RANDOM    Collection Time: 12/01/23  7:29 PM   Result Value Ref Range    Urine Urea Nitrogen 210 mg/dL   AMPHETAMINES-URINE RANDOM    Collection Time: 12/01/23  7:29 PM   Result Value Ref Range    Amphetamines Negative Negative   BARBITURATES-URINE RANDOM    Collection Time: 12/01/23  7:29 PM   Result Value Ref Range    Barbiturates,Urine Negative Negative   BENZODIAZEPINES-URINE RANDOM    Collection Time: 12/01/23  7:29 PM   Result Value Ref Range    Benzodiazepines Negative Negative   CANNABINOIDS-URINE RANDOM    Collection Time: 12/01/23  7:29 PM   Result Value Ref Range    THC Negative Negative   COCAINE-URINE RANDOM    Collection Time: 12/01/23  7:29 PM   Result Value Ref Range    Cocaine-Urine Negative Negative   OPIATES-URINE RANDOM    Collection Time: 12/01/23  7:29 PM   Result Value Ref Range    Opiates-Urine Negative Negative   PHENCYCLIDINES-URINE  RANDOM    Collection Time: 12/01/23  7:29 PM   Result Value Ref Range    Phencyclidine (PCP) Negative Negative   FENTANYL  URINE    Collection Time: 12/01/23  7:29 PM   Result Value Ref Range    Fentanyl  Negative Negative   LACTIC ACID (BG - RAPID LACTATE)    Collection Time: 12/01/23  8:41 PM   Result Value Ref Range Lactic Acid,BG 2.6 (H) 0.5 - 2.0 mmol/L   IONIZED CALCIUM    Collection Time: 12/01/23  8:41 PM   Result Value Ref Range    Ionized Calcium 1.06 1.00 - 1.30 mmol/L   BASIC METABOLIC PANEL    Collection Time: 12/01/23  8:41 PM   Result Value Ref Range    Sodium 129 (L) 137 - 147 mmol/L    Potassium 4.6 3.5 - 5.1 mmol/L    Chloride 93 (L) 98 - 110 mmol/L    Glucose 502 (HH) 70 - 100 mg/dL    Blood Urea Nitrogen 74 (H) 7 - 25 mg/dL    Creatinine 5.66 (H) 0.40 - 1.24 mg/dL    Calcium 7.7 (L) 8.5 - 10.6 mg/dL    CO2 20 (L) 21 - 30 mmol/L    Anion Gap 16 (H) 3 - 12    Glomerular Filtration Rate (GFR) 14 (L) >60 mL/min   HIGH SENSITIVITY TROPONIN I 0 HOUR    Collection Time: 12/01/23  8:41 PM   Result Value Ref Range    hs Troponin I 0 Hour 2,037 (H) <20 ng/L   SALICYLATE LEVEL    Collection Time: 12/01/23  8:41 PM   Result Value Ref Range    Salicylate <2.5 2.0 - 29.0 mg/dL   C DIFFICILE BY PCR    Collection Time: 12/01/23  8:44 PM    Specimen: Bowel Contents; Feces   Result Value Ref Range    C Diff Toxin PCR Negative Negative, Test Invalid   CRYPTOSPORIDIUM, FECAL    Collection Time: 12/01/23  8:44 PM    Specimen: Bowel Contents; Feces   Result Value Ref Range    Cryptosporidium Negative Negative, Test Invalid   LEUKOCYTES, FECAL    Collection Time: 12/01/23  8:44 PM    Specimen: Bowel Contents; Feces   Result Value Ref Range    FECAL LEUKOCYTES Positive (A) Negative, Test Lavenia HANSON [facility]    Collection Time: 12/01/23  8:44 PM    Specimen: Bowel Contents; Feces   Result Value Ref Range    Giardia EIA Negative Negative, Test Invalid   POC GLUCOSE    Collection Time: 12/01/23  8:49 PM   Result Value Ref Range    Glucose, POC 497 (H) 70 - 100 mg/dL   ALCOHOL LEVEL    Collection Time: 12/01/23  8:56 PM   Result Value Ref Range    Alcohol <10 <10 mg/dL   MRSA BY PCR (NASAL)    Collection Time: 12/01/23  9:03 PM    Specimen: Nasal; Swab   Result Value Ref Range    MRSA Pneumonia Screen Not Detected Not Detected, Test Invalid   RVP VIRAL PANEL PCR    Collection Time: 12/01/23  9:03 PM    Specimen: Nasal; Swab   Result Value Ref Range    Adenovirus Not Detected Not Detected, Test Invalid    Coronavirus 229E Not Detected Not Detected, Test Invalid    Coronavirus HKU1 Not Detected Not Detected, Test Invalid    Coronavirus NL63 Not Detected Not Detected,  Test Invalid    Coronavirus OC43 Not Detected Not Detected, Test Invalid    Human Metapneumovirus Not Detected Not Detected, Test Invalid    Human Rhinovirus/Enterovirus Not Detected Not Detected, Test Invalid    Influenza A Not Detected Not Detected, Test Invalid    Influenza B Virus Not Detected Not Detected, Test Invalid    Parainfluenza virus 1 Not Detected Not Detected, Test Invalid    Parainfluenza virus 2 Not Detected Not Detected, Test Invalid    Parainfluenza virus 3 Not Detected Not Detected, Test Invalid    Parainfluenza virus 4 Not Detected Not Detected, Test Invalid    RESP SYNCTIAL VIRUS RVP Not Detected Not Detected, Test Invalid    Bordetella parapertussis Not Detected Not Detected, Test Invalid    Bordetella pertussis PCR Not Detected Not Detected, Test Invalid    Chlamydia pneumoniae PCR Not Detected Not Detected, Test Invalid    Mycoplasma pneumoniae Not Detected Not Detected, Test Invalid    SARS-COV 2 Not Detected Not Detected   CULTURE - RESPIRATORY    Collection Time: 12/01/23  9:19 PM    Specimen: Lung; Sputum   Result Value Ref Range    Respiratory Culture No culture performed     Gram Stain       Specimen contains >25 squamous epithelial cells indicating contamination with saliva. Growth will represent normal mouth flora. Specimen does not meet criteria for culture.   PROTIME INR (PT)    Collection Time: 12/01/23  9:21 PM   Result Value Ref Range    Protime 13.4 9.9 - 14.2 Seconds    INR 1.2 0.9 - 1.2   PTT (APTT)    Collection Time: 12/01/23  9:21 PM   Result Value Ref Range    APTT 28.3 24.0 - 36.5 Seconds   BLOOD GASES, ARTERIAL    Collection Time: 12/01/23  9:25 PM   Result Value Ref Range    pH 7.38 7.35 - 7.45    pCO2-Arterial 34 (L) 35 - 45 mmHg    pO2-Arterial 87 80 - 100 mmHg    Base Deficit-Arterial 4.1 mmol/L    O2 SAT-Arterial 97.4 95.0 - 99.0 %    Bicarbonate-ART-Cal 21.0 21.0 - 28.0 mmol/L    FiO2 Value Arterial 50 %   POC GLUCOSE    Collection Time: 12/01/23  9:30 PM   Result Value Ref Range    Glucose, POC 475 (H) 70 - 100 mg/dL   HIGH SENSITIVITY TROPONIN I 2 HOUR    Collection Time: 12/01/23  9:56 PM   Result Value Ref Range    hs Troponin I 2 Hour 2,214 (H) <20 ng/L    hs Troponin I 2-0hr Delta Value 177    LACTIC ACID (BG - RAPID LACTATE)    Collection Time: 12/01/23  9:56 PM   Result Value Ref Range    Lactic Acid,BG 3.6 (H) 0.5 - 2.0 mmol/L   TRIGLYCERIDE    Collection Time: 12/01/23  9:56 PM   Result Value Ref Range    Triglycerides 193 (H) <150 mg/dL   POC GLUCOSE    Collection Time: 12/01/23  9:56 PM   Result Value Ref Range    Glucose, POC 453 (H) 70 - 100 mg/dL   POC GLUCOSE    Collection Time: 12/01/23 10:41 PM   Result Value Ref Range    Glucose, POC 446 (H) 70 - 100 mg/dL   POC GLUCOSE    Collection Time: 12/01/23 10:58 PM   Result Value  Ref Range    Glucose, POC 427 (H) 70 - 100 mg/dL     Radiology  (Last 24 hours)                 12/01/23 1802  CT HEAD WO CONTRAST Final result    Impression:          1.  Development of large subacute appearing bilateral occipitotemporal and moderate sized right corona radiata/capsular/basal ganglia infarcts. No evidence of hemorrhagic conversion. No midline shift or herniation.   2.  Stable chronic pontomedullary infarcts, left basal gallbladder-capsular infarct, and old lacunar infarcts, better demonstrated on prior MRI.   3.  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       Dr. Octavio discussed these findings with Dr. Vicci by telephone at 6:08 PM on 12/01/2023 following consultation with Dr. Lillian. By my electronic signature, I attest that I have personally reviewed the images for this examination and formulated the interpretations and opinions expressed in this report            Finalized by Alm Lillian, M.D. on 12/01/2023 6:13 PM. Dictated by Ozell Octavio, MD on 12/01/2023 6:00 PM.       12/01/23 1802  CT CHEST WO CONTRAST Final result    Impression:          CHEST:       1.  Bilateral lower lobe consolidations with additional scattered patchy nodular opacities consistent with bilateral lower lobe pneumonia.     2.  No acute thoracic injury.       ABDOMEN AND PELVIS:       1. Mild thickening of distal ileum suggesting nonspecific infectious or inflammatory enteritis. No significant ascites.   2. Moderate diverticulosis coli, most prominent in the proximal sigmoid.        By my electronic signature, I attest that I have personally reviewed the images for this examination and formulated the interpretations and opinions expressed in this report            Finalized by Floria Saris, MD, PhD on 12/01/2023 6:36 PM. Dictated by Ozell Octavio, MD on 12/01/2023 6:10 PM.       12/01/23 1802  CT ABD/PELV WO CONTRAST Final result    Impression:          CHEST:       1.  Bilateral lower lobe consolidations with additional scattered patchy nodular opacities consistent with bilateral lower lobe pneumonia.     2.  No acute thoracic injury.       ABDOMEN AND PELVIS:       1. Mild thickening of distal ileum suggesting nonspecific infectious or inflammatory enteritis. No significant ascites.   2. Moderate diverticulosis coli, most prominent in the proximal sigmoid.        By my electronic signature, I attest that I have personally reviewed the images for this examination and formulated the interpretations and opinions expressed in this report            Finalized by Floria Saris, MD, PhD on 12/01/2023 6:36 PM. Dictated by Ozell Octavio, MD on 12/01/2023 6:10 PM.       12/01/23 1749  CHEST SINGLE VIEW Final result    Impression:          Increased patchy right basilar opacities which are now more confluent, likely from pneumonia or aspiration.       Low lung volumes with additional  increased streaky left basilar opacities, likely atelectasis.       Endotracheal tube with tip just above the clavicular heads and well above the carina. Gastric tube coursing below the diaphragm, distal tip not visualized and sidehole overlying the region of the proximal stomach.       Heart size is normal. Coronary artery stenting. Significant pneumothorax or pleural effusion on supine exam.       Marked right glenohumeral arthrosis.            Finalized by Alm Schwab, M.D. on 12/01/2023 5:50 PM. Dictated by Alm Schwab, M.D. on 12/01/2023 5:42 PM.               I personally reviewed the following MRI, CT   24-hour labs:    Results for orders placed or performed during the hospital encounter of 12/01/23 (from the past 24 hours)   POC GLUCOSE    Collection Time: 12/01/23  5:19 PM   Result Value Ref Range    Glucose, POC 489 (H) 70 - 100 mg/dL   POC CREATININE    Collection Time: 12/01/23  5:21 PM   Result Value Ref Range    Creatinine, POC 4.6 (H) 0.4 - 1.24 mg/dL   POC LACTATE    Collection Time: 12/01/23  5:22 PM   Result Value Ref Range    LACTIC ACID POC 2.2 (H) 0.5 - 2.0 mmol/L   POC BLOOD GAS VEN    Collection Time: 12/01/23  5:23 PM   Result Value Ref Range    PH-VEN-POC 7.38 7.30 - 7.40    PCO2-VEN-POC 35 (L) 36 - 50 mm[Hg]    PO2-VEN-POC 29 (L) 33 - 48 mm[Hg]    Base Def-VEN-POC 4 mmol/L    O2 Sat-VEN-POC 55 55 - 71 %    Bicarbonate-VEN- POC 21 mmol/L   POC HEMATOCRIT&HEMOGLOBIN    Collection Time: 12/01/23  5:23 PM   Result Value Ref Range    Hemoglobin POC 9.5 (L) 13.5 - 16.5 g/dL    Hematocrit POC 28 (L) 40 - 50 %   POC SODIUM    Collection Time: 12/01/23  5:23 PM   Result Value Ref Range    SODIUM-POC 129 (L) 137 - 147 mmol/L   POC POTASSIUM    Collection Time: 12/01/23  5:23 PM   Result Value Ref Range    Potassium-POC 4.2 3.5 - 5.1 mmol/L   POC BLOOD GAS VEN    Collection Time: 12/01/23  5:25 PM   Result Value Ref Range    PH-VEN-POC 7.40 7.30 - 7.40    PCO2-VEN-POC 34 (L) 36 - 50 mm[Hg]    PO2-VEN-POC 34 33 - 48 mm[Hg]    Base Def-VEN-POC 4 mmol/L    O2 Sat-VEN-POC 66 55 - 71 %    Bicarbonate-VEN- POC 21 mmol/L   POC HEMATOCRIT&HEMOGLOBIN    Collection Time: 12/01/23  5:25 PM   Result Value Ref Range    Hemoglobin POC 7.1 (L) 13.5 - 16.5 g/dL    Hematocrit POC 21 (L) 40 - 50 %   POC SODIUM    Collection Time: 12/01/23  5:25 PM   Result Value Ref Range    SODIUM-POC 128 (L) 137 - 147 mmol/L   POC POTASSIUM    Collection Time: 12/01/23  5:25 PM   Result Value Ref Range    Potassium-POC 4.2 3.5 - 5.1 mmol/L   CBC AND DIFF    Collection Time: 12/01/23  5:29 PM   Result  Value Ref Range    White Blood Cells 11.00 4.50 - 11.00 10*3/uL    Red Blood Cells 2.52 (L) 4.40 - 5.50 10*6/uL    Hemoglobin 7.7 (L) 13.5 - 16.5 g/dL    Hematocrit 78.4 (L) 40.0 - 50.0 %    MCV 85.1 80.0 - 100.0 fL    MCH 30.6 26.0 - 34.0 pg    MCHC 35.9 32.0 - 36.0 g/dL    RDW 86.1 88.9 - 84.9 %    Platelet Count 165 150 - 400 10*3/uL    MPV 9.9 7.0 - 11.0 fL    Neutrophils 84.6 (H) 41.0 - 77.0 %    Lymphocytes 7.9 (L) 24.0 - 44.0 %    Monocytes 7.2 4.0 - 12.0 %    Eosinophils 0.0 0.0 - 5.0 %    Basophils 0.3 0.0 - 2.0 %    Absolute Neutrophil Count 9.30 (H) 1.80 - 7.00 10*3/uL    Absolute Lymph Count 0.90 (L) 1.00 - 4.80 10*3/uL    Absolute Monocyte Count 0.80 0.00 - 0.80 10*3/uL    Absolute Eosinophil Count 0.00 0.00 - 0.45 10*3/uL    Absolute Basophil Count 0.00 0.00 - 0.20 10*3/uL    MDW (Monocyte Distribution Width) 29.5 (H) <=20.6   COMPREHENSIVE METABOLIC PANEL    Collection Time: 12/01/23  5:29 PM   Result Value Ref Range    Sodium 128 (L) 137 - 147 mmol/L    Potassium 4.3 3.5 - 5.1 mmol/L    Chloride 92 (L) 98 - 110 mmol/L    Glucose 557 (HH) 70 - 100 mg/dL    Blood Urea Nitrogen 80 (H) 7 - 25 mg/dL    Creatinine 5.62 (H) 0.40 - 1.24 mg/dL    Calcium 7.9 (L) 8.5 - 10.6 mg/dL    Total Protein 6.3 6.0 - 8.0 g/dL    Total Bilirubin 0.4 0.2 - 1.3 mg/dL    Albumin 2.9 (L) 3.5 - 5.0 g/dL    Alk Phosphatase 69 25 - 110 U/L    AST 77 (H) 7 - 40 U/L    ALT 102 (H) 7 - 56 U/L    CO2 21 21 - 30 mmol/L    Anion Gap 15 (H) 3 - 12    Glomerular Filtration Rate (GFR) 13 (L) >60 mL/min   TYPE & CROSSMATCH    Collection Time: 12/01/23  5:29 PM   Result Value Ref Range    ABO/RH(D) AB POS     Antibody Screen NEG     Crossmatch Expires 12/04/2023,2359     Units Ordered 1     Electronic Crossmatch YES     Record Check FOUND    NT-PRO-BNP    Collection Time: 12/01/23  5:29 PM   Result Value Ref Range    NT-Pro-BNP 7,292 (H) <450 pg/mL   MAGNESIUM     Collection Time: 12/01/23  5:29 PM   Result Value Ref Range    Magnesium  2.0 1.6 - 2.6 mg/dL   LIPASE     Collection Time: 12/01/23  5:29 PM   Result Value Ref Range    Lipase  17 11 - 82 U/L   HIGH SENSITIVITY TROPONIN I 0 HOUR    Collection Time: 12/01/23  5:29 PM   Result Value Ref Range    hs Troponin I 0 Hour 2,778 (H) <20 ng/L   BETA HYDROXYBUTYRATE (KETONES)    Collection Time: 12/01/23  5:29 PM   Result Value Ref Range    Beta Hydroxybutyrate 0.2 <0.3  mmol/L   PREPARE RBC - SUNQUEST    Collection Time: 12/01/23  5:29 PM   Result Value Ref Range    Units Ordered 1     Crossmatch Expires 12/04/2023,2359     Record Check FOUND     ABO/RH(D) AB POS     Antibody Screen NEG     Electronic Crossmatch YES     Unit Number [id number]     Blood Component Type RBC,ADSOL,LEUKO REDUCED     Unit Division 00     Status OF Unit TRANSFUSED     ISSUE DATE TIME [id number]     PRODUCT CODE [id number]     BLOOD TYPE A POS     CODING STATUS 6200     BLOOD EXPIRATION DATE [id number]     Transfusion Status OK TO TRANSFUSE     Crossmatch Result COMPATIBLE,ELECTRONIC    CONFIRMATION BLOOD TYPE    Collection Time: 12/01/23  5:34 PM   Result Value Ref Range    ABO/RH(D) AB POS    BETA HYDROXYBUTYRATE (KETONES)    Collection Time: 12/01/23  6:27 PM   Result Value Ref Range    Beta Hydroxybutyrate 0.2 <0.3 mmol/L   CREATINE KINASE-CPK    Collection Time: 12/01/23  6:27 PM   Result Value Ref Range    Creatine Kinase 2,533 (H) 35 - 232 U/L   HIGH SENSITIVITY TROPONIN I 2 HOUR    Collection Time: 12/01/23  7:23 PM   Result Value Ref Range    hs Troponin I 2 Hour 2,309 (H) <20 ng/L    hs Troponin I 2-0hr Delta Value -469    ACETAMINOPHEN  LEVEL    Collection Time: 12/01/23  7:23 PM   Result Value Ref Range    Acetaminophen  <10.0 <=20.0 ?g/mL   BETA HYDROXYBUTYRATE (KETONES)    Collection Time: 12/01/23  7:23 PM   Result Value Ref Range    Beta Hydroxybutyrate 0.2 <0.3 mmol/L   POC GLUCOSE    Collection Time: 12/01/23  7:26 PM   Result Value Ref Range    Glucose, POC 478 (H) 70 - 100 mg/dL   URINALYSIS DIPSTICK REFLEX TO CULTURE    Collection Time: 12/01/23  7:29 PM    Specimen: Midstream; Urine   Result Value Ref Range    Color,UA Amber     Turbidity,UA 2+ (A) Clear    Specific Gravity-Urine 1.022 1.005 - 1.030    pH,UA 5.0 5.0 - 8.0    Protein,UA 3+ (A) Negative    Glucose,UA 3+ (A) Negative    Ketones,UA Negative Negative    Bilirubin,UA Negative Negative    Blood,UA 2+ (A) Negative    Urobilinogen,UA Normal Normal    Nitrite,UA Negative Negative    Leukocytes,UA Trace (A) Negative   URINALYSIS MICROSCOPIC REFLEX TO CULTURE    Collection Time: 12/01/23  7:29 PM    Specimen: Midstream; Urine   Result Value Ref Range    WBCs,UA 20 - 50 (A) None, 0 - 2  /HPF    RBCs,UA 11 - 20 (A) None, 0 - 2  /HPF    Mucous,UA Trace None, Trace /LPF    Bacteria,UA Many (A) None /HPF    Amorphous Sediment,UA Few (A) None /HPF    Hyaline Cast 2 - 5 (A) None, 0 - 2  /LPF    Transitional Epithelial 0 - 2 None, 0 - 2  /HPF   POC LACTATE    Collection Time: 12/01/23  7:29 PM   Result  Value Ref Range    LACTIC ACID POC 2.4 (H) 0.5 - 2.0 mmol/L   SODIUM-URINE RANDOM    Collection Time: 12/01/23  7:29 PM   Result Value Ref Range    Urine Sodium 29 mmol/L   CREATININE-URINE RANDOM    Collection Time: 12/01/23 7:29 PM   Result Value Ref Range    Urine Creatinine 92 mg/dL   UREA NITROGEN-URINE RANDOM    Collection Time: 12/01/23  7:29 PM   Result Value Ref Range    Urine Urea Nitrogen 210 mg/dL   AMPHETAMINES-URINE RANDOM    Collection Time: 12/01/23  7:29 PM   Result Value Ref Range    Amphetamines Negative Negative   BARBITURATES-URINE RANDOM    Collection Time: 12/01/23  7:29 PM   Result Value Ref Range    Barbiturates,Urine Negative Negative   BENZODIAZEPINES-URINE RANDOM    Collection Time: 12/01/23  7:29 PM   Result Value Ref Range    Benzodiazepines Negative Negative   CANNABINOIDS-URINE RANDOM    Collection Time: 12/01/23  7:29 PM   Result Value Ref Range    THC Negative Negative   COCAINE-URINE RANDOM    Collection Time: 12/01/23  7:29 PM   Result Value Ref Range    Cocaine-Urine Negative Negative   OPIATES-URINE RANDOM    Collection Time: 12/01/23  7:29 PM   Result Value Ref Range    Opiates-Urine Negative Negative   PHENCYCLIDINES-URINE RANDOM    Collection Time: 12/01/23  7:29 PM   Result Value Ref Range    Phencyclidine (PCP) Negative Negative   FENTANYL  URINE    Collection Time: 12/01/23  7:29 PM   Result Value Ref Range    Fentanyl  Negative Negative   LACTIC ACID (BG - RAPID LACTATE)    Collection Time: 12/01/23  8:41 PM   Result Value Ref Range    Lactic Acid,BG 2.6 (H) 0.5 - 2.0 mmol/L   IONIZED CALCIUM    Collection Time: 12/01/23  8:41 PM   Result Value Ref Range    Ionized Calcium 1.06 1.00 - 1.30 mmol/L   BASIC METABOLIC PANEL    Collection Time: 12/01/23  8:41 PM   Result Value Ref Range    Sodium 129 (L) 137 - 147 mmol/L    Potassium 4.6 3.5 - 5.1 mmol/L    Chloride 93 (L) 98 - 110 mmol/L    Glucose 502 (HH) 70 - 100 mg/dL    Blood Urea Nitrogen 74 (H) 7 - 25 mg/dL    Creatinine 5.66 (H) 0.40 - 1.24 mg/dL    Calcium 7.7 (L) 8.5 - 10.6 mg/dL    CO2 20 (L) 21 - 30 mmol/L    Anion Gap 16 (H) 3 - 12    Glomerular Filtration Rate (GFR) 14 (L) >60 mL/min   HIGH SENSITIVITY TROPONIN I 0 HOUR    Collection Time: 12/01/23  8:41 PM   Result Value Ref Range    hs Troponin I 0 Hour 2,037 (H) <20 ng/L   SALICYLATE LEVEL    Collection Time: 12/01/23  8:41 PM   Result Value Ref Range    Salicylate <2.5 2.0 - 29.0 mg/dL   C DIFFICILE BY PCR    Collection Time: 12/01/23  8:44 PM    Specimen: Bowel Contents; Feces   Result Value Ref Range    C Diff Toxin PCR Negative Negative, Test Invalid   CRYPTOSPORIDIUM, FECAL    Collection Time: 12/01/23  8:44 PM    Specimen: Bowel  Contents; Feces   Result Value Ref Range    Cryptosporidium Negative Negative, Test Invalid   LEUKOCYTES, FECAL    Collection Time: 12/01/23  8:44 PM    Specimen: Bowel Contents; Feces   Result Value Ref Range    FECAL LEUKOCYTES Positive (A) Negative, Test Lavenia HANSON [facility]    Collection Time: 12/01/23  8:44 PM    Specimen: Bowel Contents; Feces   Result Value Ref Range    Giardia EIA Negative Negative, Test Invalid   POC GLUCOSE    Collection Time: 12/01/23  8:49 PM   Result Value Ref Range    Glucose, POC 497 (H) 70 - 100 mg/dL   ALCOHOL LEVEL    Collection Time: 12/01/23  8:56 PM   Result Value Ref Range    Alcohol <10 <10 mg/dL   MRSA BY PCR (NASAL)    Collection Time: 12/01/23  9:03 PM    Specimen: Nasal; Swab   Result Value Ref Range    MRSA Pneumonia Screen Not Detected Not Detected, Test Invalid   RVP VIRAL PANEL PCR    Collection Time: 12/01/23  9:03 PM    Specimen: Nasal; Swab   Result Value Ref Range    Adenovirus Not Detected Not Detected, Test Invalid    Coronavirus 229E Not Detected Not Detected, Test Invalid    Coronavirus HKU1 Not Detected Not Detected, Test Invalid    Coronavirus NL63 Not Detected Not Detected, Test Invalid    Coronavirus OC43 Not Detected Not Detected, Test Invalid    Human Metapneumovirus Not Detected Not Detected, Test Invalid    Human Rhinovirus/Enterovirus Not Detected Not Detected, Test Invalid    Influenza A Not Detected Not Detected, Test Invalid    Influenza B Virus Not Detected Not Detected, Test Invalid Parainfluenza virus 1 Not Detected Not Detected, Test Invalid    Parainfluenza virus 2 Not Detected Not Detected, Test Invalid    Parainfluenza virus 3 Not Detected Not Detected, Test Invalid    Parainfluenza virus 4 Not Detected Not Detected, Test Invalid    RESP SYNCTIAL VIRUS RVP Not Detected Not Detected, Test Invalid    Bordetella parapertussis Not Detected Not Detected, Test Invalid    Bordetella pertussis PCR Not Detected Not Detected, Test Invalid    Chlamydia pneumoniae PCR Not Detected Not Detected, Test Invalid    Mycoplasma pneumoniae Not Detected Not Detected, Test Invalid    SARS-COV 2 Not Detected Not Detected   CULTURE - RESPIRATORY    Collection Time: 12/01/23  9:19 PM    Specimen: Lung; Sputum   Result Value Ref Range    Respiratory Culture No culture performed     Gram Stain       Specimen contains >25 squamous epithelial cells indicating contamination with saliva. Growth will represent normal mouth flora. Specimen does not meet criteria for culture.   PROTIME INR (PT)    Collection Time: 12/01/23  9:21 PM   Result Value Ref Range    Protime 13.4 9.9 - 14.2 Seconds    INR 1.2 0.9 - 1.2   PTT (APTT)    Collection Time: 12/01/23  9:21 PM   Result Value Ref Range    APTT 28.3 24.0 - 36.5 Seconds   BLOOD GASES, ARTERIAL    Collection Time: 12/01/23  9:25 PM   Result Value Ref Range    pH 7.38 7.35 - 7.45    pCO2-Arterial 34 (L) 35 - 45 mmHg    pO2-Arterial 87 80 -  100 mmHg    Base Deficit-Arterial 4.1 mmol/L    O2 SAT-Arterial 97.4 95.0 - 99.0 %    Bicarbonate-ART-Cal 21.0 21.0 - 28.0 mmol/L    FiO2 Value Arterial 50 %   POC GLUCOSE    Collection Time: 12/01/23  9:30 PM   Result Value Ref Range    Glucose, POC 475 (H) 70 - 100 mg/dL   HIGH SENSITIVITY TROPONIN I 2 HOUR    Collection Time: 12/01/23  9:56 PM   Result Value Ref Range    hs Troponin I 2 Hour 2,214 (H) <20 ng/L    hs Troponin I 2-0hr Delta Value 177    LACTIC ACID (BG - RAPID LACTATE)    Collection Time: 12/01/23  9:56 PM   Result Value Ref Range    Lactic Acid,BG 3.6 (H) 0.5 - 2.0 mmol/L   TRIGLYCERIDE    Collection Time: 12/01/23  9:56 PM   Result Value Ref Range    Triglycerides 193 (H) <150 mg/dL   POC GLUCOSE    Collection Time: 12/01/23  9:56 PM   Result Value Ref Range    Glucose, POC 453 (H) 70 - 100 mg/dL   POC GLUCOSE    Collection Time: 12/01/23 10:41 PM   Result Value Ref Range    Glucose, POC 446 (H) 70 - 100 mg/dL   POC GLUCOSE    Collection Time: 12/01/23 10:58 PM   Result Value Ref Range    Glucose, POC 427 (H) 70 - 100 mg/dL

## 2023-12-02 NOTE — Consults
 Wound Ostomy Note    NAME:Kevin Obrien                                                                   MRN: [id number]                 DOB:March 30, 1951          AGE: 73 y.o.  ADMISSION DATE: 12/01/2023             DAYS ADMITTED: LOS: 1 day      Reason for Consult/Visit: pressure injury Stage II or greater    Assessment/Plan:    Principal Problem:    Acute respiratory failure (CMS-HCC)  Active Problems:    Essential hypertension    Dyslipidemia    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)    Ischemic stroke (CMS-HCC)    Acute encephalopathy    On mechanically assisted ventilation (CMS-HCC)    Aspiration pneumonia (CMS-HCC)    NSTEMI (non-ST elevated myocardial infarction) (CMS-HCC)    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 (CMS-HCC)    Anemia    History of gastrointestinal diverticular hemorrhage    Acute kidney injury    Elevated liver enzymes    Coronary artery disease    History of coronary artery stent placement    Diarrhea    Elevated creatine kinase level    Lactic acidosis    Sepsis, unspecified organism (CMS-HCC)    Elevated brain natriuretic peptide (BNP) level    Prolonged QT interval    Wound consult ordered by primary team for: sacrum/coccyx, L heel    Per primary team note 6/22:   PMH of recent Acute R pontomedullary infarct, CAD s/p PCI, GI bleed, BPH, DM2, shingles, HTN, and HLD, who presented to [place] ED from SNF on 6/22 after found to be obtunded by his daughters. Since DC from [facility] to [facility] 6/20, he was felt to have continuously declining mental status w/ new O2 requirements of 3LNC, difficulty managing glucose and worsening UOP. Of note, recent admits 5/8-5/17 (s/p fall)-> IP rehab 5/19-6/12, readmitted from rehab for hematochezia 6/12-6/20, again dc'd to [facility] 6/20.    On arrival to [place], his GCS 3, intubated for airway protection/respiratory failure. Initial labs w/ BG 557, Trop 2778, BNP 7292, Cr 4.37, lactate 2.4, AG 15, Hgb 7.7, UA w/ UTI. CT head w/ new large bilateral subacute occipitotemporal and moderate R basal ganglial infarcts; CT chest w/ bilateral lower lobe PNAs. Insulin  gtt started. 1u PRBC for goal Hgb >8 in setting of NSTEMI. Given 2L IVF, Vanc, Zosyn , for UTI/PNA coverage. Admitted to MICU for management of acute respiratory failure, new subacute CVAs, sepsis, hyperglycemia, and NSTEMI.      RISK FACTORS: incontinence, limited mobility, and altered mental status    Assessment:   Pt assessed with primary RN. Pt is intubated and sedated. RN reports pt has been incontinent of stool; has foley catheter for urine mgmt.   Sacrum, left ischium and left heel each with Deep Tissue (pressure) Injury presenting with non-blanching purple discoloration. Sacrum and left ischium with blistering also present.     Plan of Care: orders placed per skin integrity protocol  Sacrum + Left ischium -   - Apply thin layer of barrier ointment to affected  areas TID, and more often as needed, to protect skin from stool/urine incontinence.    Additional recommendations:  - If pt is on a [employer] bed, add air pump to upgrade mattress to a low air loss support surface and to optimize microclimate for skin.  - Implement/continue turning schedule with turn support via bed mechanism (if available) or using foam wedge support.   - Avoid briefs when possible. Use only one underpad at a time underneath patient (do not stack/layer pads) to prevent heat/moisture trapping against skin.  - Position HOB no greater than 30 degrees, unless positioning is unsafe for pt's condition, to reduce friction/shear over sacrum/coccyx.  - Place foam egg crate cushion in seat prior to pt spending time out of bed in chair.  - Recommend q15 min weight shifts (lean to left/right) to alleviate pressure when seated.  - Consider dietician consult (f not already ordered) to address wound healing needs.  - Assess and reposition medical devices to alleviate pressure and tension on skin.    Left heel -   - Ok to cover with foam for protection. Change q48 hrs.   - Place foam offloading boots to lower extremities to alleviate pressure on heels.    Wounds Pressure injury Sacrum (Active)   12/01/23 2030   Wound Type: Pressure injury   Orientation:    Location: Sacrum   Wound Location Comments: DTI   Initial Wound Site Closure:    Initial Dressing Placed:    Initial Cycle:    Initial Suction Setting (mmHg):    Pressure Injury Stages: Deep tissue pressure injury   Pressure Injury Present Within 24 Hours of Hospital Admission: Yes   If This Pressure Injury Is Suspected to Be Device Related, Please Select the Device::    Is the Wound Open or Closed:    Image   12/02/23 1330   Wound Assessment Purple;Non-blanchable;Other (Comment) 12/02/23 1330   Peri-wound Assessment Dry;Intact 12/02/23 1330   Wound Drainage Amount None 12/02/23 1330   Wound Dressing and/or Treatment A & D ointment 12/02/23 1330   Wound Status (Wound Team Only) Being Treated 12/02/23 1330   Wound Length (cm) (Wound Team Only) 7.5 cm 12/02/23 1330   Wound Width (cm) (Wound Team Only) 6 cm 12/02/23 1330   Wound Depth (cm) (Wound Team Only) 0.1 cm 12/02/23 1330   Wound Surface Area (cm^2) (Wound Team Only) 35.34 cm^2 12/02/23 1330   Wound Volume (cm^3) (Wound Team Only) 2.356 cm^3 12/02/23 1330   Number of days: 1       Wounds Pressure injury Left Heel (Active)   12/01/23 2030   Wound Type: Pressure injury   Orientation: Left   Location: Heel   Wound Location Comments: DTI   Initial Wound Site Closure:    Initial Dressing Placed:    Initial Cycle:    Initial Suction Setting (mmHg):    Pressure Injury Stages: Deep tissue pressure injury   Pressure Injury Present Within 24 Hours of Hospital Admission: Yes   If This Pressure Injury Is Suspected to Be Device Related, Please Select the Device::    Is the Wound Open or Closed:    Image   12/02/23 1330   Wound Assessment Purple;Red;Non-blanchable 12/02/23 1330   Peri-wound Assessment Dry;Intact 12/02/23 1330   Wound Drainage Amount None 12/02/23 1330   Wound Dressing Status Intact 12/02/23 1330   Wound Care Dressing reinforced 12/02/23 1330   Wound Dressing and/or Treatment Foam 12/02/23 1330   Wound Status (Wound Team Only)  Being Treated 12/02/23 1330   Wound Length (cm) (Wound Team Only) 4 cm 12/02/23 1330   Wound Width (cm) (Wound Team Only) 5 cm 12/02/23 1330   Wound Depth (cm) (Wound Team Only) 0.1 cm 12/02/23 1330   Wound Surface Area (cm^2) (Wound Team Only) 15.71 cm^2 12/02/23 1330   Wound Volume (cm^3) (Wound Team Only) 1.047 cm^3 12/02/23 1330   Number of days: 1       Wounds Pressure injury Left Ischium (Active)   12/02/23 1330   Wound Type: Pressure injury   Orientation: Left   Location: Ischium   Wound Location Comments:    Initial Wound Site Closure:    Initial Dressing Placed:    Initial Cycle:    Initial Suction Setting (mmHg):    Pressure Injury Stages: Deep tissue pressure injury   Pressure Injury Present Within 24 Hours of Hospital Admission: Yes   If This Pressure Injury Is Suspected to Be Device Related, Please Select the Device::    Is the Wound Open or Closed:    Image   12/02/23 1330   Wound Assessment Red;Non-blanchable;Other (Comment) 12/02/23 1330   Peri-wound Assessment Intact 12/02/23 1330   Wound Drainage Amount None 12/02/23 1330   Wound Dressing and/or Treatment A & D ointment 12/02/23 1330   Wound Status (Wound Team Only) Being Treated 12/02/23 1330   Wound Length (cm) (Wound Team Only) 5.5 cm 12/02/23 1330   Wound Width (cm) (Wound Team Only) 5 cm 12/02/23 1330   Wound Depth (cm) (Wound Team Only) 0.1 cm 12/02/23 1330   Wound Surface Area (cm^2) (Wound Team Only) 21.6 cm^2 12/02/23 1330   Wound Volume (cm^3) (Wound Team Only) 1.44 cm^3 12/02/23 1330   Number of days: 0     Bernarda Hutchinson, RN, BSN, [employer]  Wound/Ostomy Nursing Consult Service  Available via [employer] text when in house  For questions after 4:00pm M-F/weekends/holidays, voalte ?First Contact?

## 2023-12-02 NOTE — Progress Notes
 Critical Care Progress Note    Kevin Obrien  Today's Date:  12/02/2023  Admission Date: 12/01/2023  LOS: 1 day    Principal Problem:    Acute respiratory failure (CMS-HCC)  Active Problems:    Essential hypertension    Dyslipidemia    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)    Ischemic stroke (CMS-HCC)    Acute encephalopathy    On mechanically assisted ventilation (CMS-HCC)    Aspiration pneumonia (CMS-HCC)    NSTEMI (non-ST elevated myocardial infarction) (CMS-HCC)    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 (CMS-HCC)    Anemia    History of gastrointestinal diverticular hemorrhage    Acute kidney injury    Elevated liver enzymes    Coronary artery disease    History of coronary artery stent placement    Diarrhea    Elevated creatine kinase level    Lactic acidosis    Sepsis, unspecified organism (CMS-HCC)    Elevated brain natriuretic peptide (BNP) level    Prolonged QT interval    Brief Hospital Course:      Kevin Obrien is a 73 y.o. male w/ PMH of recent pontomedullary infarct (10/2023) w/ extensive intracranial atherosclerotic disease, CAD s/p PCI, HTN, HLD, GIB, BPH, & DM2. Several recent admissions since May 2025; most recently admitted 6/12 - 6/20 for GIB; dc'd to SNF. Presented to ED for AMS. GCS 3 so intubated for airway protection. Initial labs significant for elevated troponin, AKI, & hyperglycemia. CT head revealed new large bilateral subacute occipitotemporal & moderate R basal ganglia infarcts. CT chest c/f pneumonia. Given IVF & abx. Admit to MICU.    Neurology following & assisting w/ stroke management; plan for serial imaging. Neuro exam poor off sedation.  Hs troponin now downtrending but does have e/o new WMA & acutely reduced EF on echo; managing medically for now.   Infectious work-up in process; remains on abx.    Assessment/Plan:      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  Cerebral Atherosclerosis  - Likely multifactorial d/t new strokes, metabolic derangements in the setting of AKI, & infection  - Suspect new strokes likely d/t ICAD w/ cerebral hypoperfusion etiology  - PTA Plavix  75mg  daily, ASA 81mg  daily, melatonin 5mg , Remeron  15mg  daily HS, Modafinil  200mg  daily AM then 100mg   - Admit May 2025 w/ acute R pontomedullary brainstem infarct; planned for 3 months DAPT followed by aspirin  monotherapy  - Baseline non-verbal post CVA 5/9 w/ L side deficits (decreased ROM/attempting fine motor), assisted ambulation; more recently required 1-2 assist and predominantly bed bound in last week   - GCS 3 on ED arrival -- intubated for airway protection  - UDS & alcohol negative 6/22  - CT head 6/22: Development of large subacute appearing bilateral occipitotemporal & moderate sized right corona radiata/capsular/basal ganglia infarcts. No e/o hemorrhage conversion, midline shift, or herniation. Stable chronic pontomedullary infarcts, basal ganglia capsular infarct, lacunar infarcts.  - MRI brain 6/23: Development of large bilateral acute-early subacute infarcts involving the right occipitotemporal region & left temporoparietal occipital region. Large areas of acute/early subacute infarct involving the bilateral corona radiata & centrum semiovale w/ scattered puntatate cortical infarcts. No herniation or hydrocephalus. Findings c/w superimposed microhemorrhage within b/l cerebral hemispheres & withiin areas of infarct. Additional acute-early subacute infarcts within the bilateral cerebral hemispheres, cerebral peduncles, & brainstem.  - MRA  head 6/23: Persistent occlusion of the proximal right intracranial vertebral arterial. Persistent occlusion of the proximal right intracranial vertebral artery. Multifocal intracranial arterial luminal stenoses, including the right paraclinoid internal carotid artery, right middle cerebral, and bilateral   posterior cerebral arteries, without large vessel occlusion.   - MRA neck 6/23: Severe stenosis of the right V1 segment. Mild multifocal luminal   irregularity the left cervical vertebral artery. Moderate stenosis of the mid left ICA with otherwise mild multifocal irregularity of the bilateral CCAs and cervical ICAs.   PLAN  - Neurology following  - Q1 neurochecks  - SBP goal 120 - 200  - Corrected Na goal 140   - Plan for CT head at 1200 for stability -- pending findings may start 3%; anticipate CT head daily x ~2d thereafter  - EEG  - Hgb goal > 8 d/t hypoperfusion etiology of strokes  - Continue PTA Plavix  & ASA w/ new infarcts  - Hold remaining PTA medications  - Check P2Y12  Sedation for Mechanical Ventilation   - Propofol  off since ~0400  PLAN  - Low dose propofol  if needed  - Fentanyl  12.5mcg q2hrs    PULM  Acute Hypoxic Respiratory Failure  Aspiration PNA  Mechanically Ventilated  - C/f atelectasis vs aspiration PNA noted in previous admission 5/8-5/17  - CXR 6/20 prior to discharge to SNF w/ RLL opacities w/ c/f PNA  - New oxygen requirement of 3L at SNF on 6/21  - Intubated for airway protection on ED arrival   - CXR 6/22: Patchy right basilar opacities, likely pneumonia vs aspiration. Low lung volumes w/ streaky left basilar opacities, likely atelectasis.atelectasis, RLL consolidation c/f PNA  - CT chest 6/22: Bilateral lower lobe consolidations with additional scattered patchy nodular opacities consistent with bilateral lower lobe pneumonia.     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 Segmental WMA w/ hypokinesis of apex & anterior lateral wall. Grade II LV diastolic dysfunction. RV size & function normal. No valvular issues.   - Currently NE 0.03 mcg/kg/min  - HR 80s  PLAN  - Titrate NE for SBP goal 120 - 200 as above  - NICOM w/ NS  - Trend hs troponin  - S/p ASA load, continuing PTA ASA 81mg  daily as above  - Hold on Cardiology consult for now w/ downtrending troponin; feel likely not a candidate for invasive work-up  - Hold PTA medications    GI  Dysphagia s/p PEG  Recent GIB  Mild Transaminitis  - Admit 6/12 - 6/20 w/ hematochezia; EGD unremarkable, colonoscopy w/ no active bleed. Bleeding ultimately resolved & resumed DAPT.  - CT a/p 6/22: Mild thickening of distal ileum suggesting nonspecific infectious or  inflammatory enteritis. No significant ascites. Moderate diverticulosis coli, most prominent in the proximal sigmoid.   - AST 100, ALT 93, alk phos 71  PLAN  - NPO  - Last BM PTA  - Miralax  daily, Senokot BID  - Trend CMP  - Protonix  for ppx    RENAL  AKI  AGMA  Lactic Acidosis  Hypochloremia  - Ddx pre-renal in the setting of reported diarrhea PTA vs ATN d/t sepsis  - Baseline Cr 0.8; on admit Cr 4.33  - FENa 1%, indeterminate   - CT a/p w/ no hydro or renal stones  - LA 4.5 - > 2.4  - Cr 4.67, BUN 75, CO2 18, AG 19, Na 131 (corrected 134)   - I/O: net + 3.5  - UOP 56mL  PLAN  - BMP Q6 w/ above Na goal -- may start 3% saline as above pending repeat CT head & neuro recs  - Strict I/O  Elevated CPK   - CPK 2,533 -> 3,129  - S/p 2L IVF in ED  PLAN  - Trend CPK BID  - NICOM w/ NS as above    ENDO  DMT2  Hyperglycemia  - PTA Aspart SSI, Lanus 20u daily   - Most recent A1c 5/8 - 11.7  - BHB 0.2  - Glucose > 500 on admit  PLAN  - Insulin  gtt via endotool    ID  Pneumonia  Sepsis  UTI  Diarrhea  Hx E Coli UTI   - Prior E Coli UTI resistant to ampicillin & trimethsulfa; intermediate to Unasyn  - Imaging as above  - Reported diarrhea PTA  - Blood cx 6/22: in process  - Blood cx 6/23: in process  - UA 6/22 w/ negative nitrites, trace leuks, 20-50 WBCs; cx in process  - Fecal cx 6/22 in process  - Giardia negative  - Cryptosporidium negative  - C Diff 6/22 - negative  - MRSA pneumonia PCR & RVP 6/22 - negative  - Procal 6/22 - 51  - WBC 11.8, afebrile  PLAN  - Continue Zosyn   - Repeat sputum cx (previous contaminated)    HEME  Chronic anemia  - Hgb 9.4, plt 173  - Heparin  for VTE ppx    MSK/SKIN  DTI, sacrum/coccyx and L heel  - Wound team consult placed    PPX:  Lines: PIVs, art line  Drains/Tubes: ETT, PEG, foley   DVT: SCDs; heparin   GI: PPI    Code Status: Full  Disposition: Continue ICU care     Pt critically ill with above diagnoses. I spent 60 minutes providing critical care services including:  performing a physical examination  serially reviewing laboratory, telemetry,  hemodynamic, oximetry, and respiratory data  reviewing radiographic images  reviewing medications  managing fluids/electrolytes, antibiotics, ICU prophylaxis, and mechanical ventilation   developing the overall plan of care.    Kreg Fruits, APRN, [facility]  Pulm/Critical Care  Available on Voalte  M2 Pager [phone]  12/02/2023 1151 time  __________________________________________________________________________________    Subjective:     Kevin Obrien is a 73 y.o. male who is on mechanical ventilation. No family at bedside.    ROS - unable to obtain d/t pt factors.     Objective:     Medications:  Scheduled Meds:albuterol -ipratropium (DUONEB) nebulizer solution 3 mL, 3 mL, Inhalation, Q4H & PRN  aspirin  chewable tablet 81 mg, 81 mg, Oral, QDAY  clopiDOGreL  (PLAVIX ) tablet 75 mg, 75 mg, Oral, QDAY  heparin  (porcine) PF syringe 5,000  Units, 5,000 Units, Subcutaneous, Q12H  linezolid   (ZYVOX )  600 mg/D5W 300 mL IVPB, 600 mg, Intravenous, Q12H*  LORAZEPAM  2 MG/ML IJ SOLN GROUP (Cabinet Override), , , NOW  norepinephrine  (LEVOPHED ) 4 mg in dextrose  5% (D5W) 250 mL IV drip (std conc) (premade) (Cabinet Override), , , NOW  pantoprazole  (PROTONIX ) injection 40 mg, 40 mg, Intravenous, QDAY  piperacillin /tazobactam (ZOSYN ) 4.5 g in sodium chloride  0.9% (NS) 100 mL IVPB (MB+), 4.5 g, Intravenous, Q12H*  polyethylene glycol 3350  (MIRALAX ) packet 17 g, 1 packet, Oral, QDAY  senna (SENOKOT) oral syrup 8.8 mg, 8.8 mg, Oral, BID  sennosides-docusate sodium  (SENOKOT-S) tablet 1 tablet, 1 tablet, Per G Tube, QDAY  SODIUM CHLORIDE  0.9% IV SOLP (Cabinet Override), , , NOW  SODIUM CHLORIDE  0.9% IV SOLP (Cabinet Override), , , NOW  thiamine  mononitrate (vit B1) tablet 100 mg, 100 mg, Per NG tube, QDAY    Continuous Infusions:   insulin  regular 100 units/NS 100 mL IV drip (premade) 10.5 Units/hr (12/02/23 0507)    norepinephrine  (LEVOPHED ) 4 mg in dextrose  5% (D5W) 250 mL IV drip (std conc) 0.03 mcg/kg/min (12/02/23 0501)    propofoL  (DIPRIVAN ) 10 mg/mL IV drip Stopped (12/02/23 0414)     PRN and Respiratory Meds:acetaminophen  Q4H PRN, fentaNYL  citrate PF Q2H PRN, pancrelipase  20,880 Units/sodium bicarbonate  650 mg ([employer] CLOG DESTROYER) PRN (On Call from Rx)                       Vital Signs: Last Filed                  Vital Signs: 24 Hour Range   BP: 106/53 (06/23 0314)  Temp: 37.6 ?C (99.7 ?F) (06/23 0600)  Pulse: 89 (06/23 0600)  Respirations: 21 PER MINUTE (06/23 0600)  SpO2: 98 % (06/23 0600)  O2%: 30 % (06/23 0600)  O2 Device: Ventilator (06/23 0600)  O2 Liter Flow: 15 Lpm (06/22 1720)  Height: 172.7 cm (5' 8) (06/22 2022) BP: (91-128)/(45-92)   ABP: (115-172)/(39-54)   Temp:  [36.5 ?C (97.7 ?F)-37.8 ?C (100 ?F)]   Pulse:  [78-109]   Respirations:  [18 PER MINUTE-29 PER MINUTE]   SpO2:  [92 %-100 %]   O2%:  [30 %-60 %]   O2 Device: Ventilator  O2 Liter Flow: 15 Lpm      Vitals:    12/01/23 1720 12/01/23 2022 12/02/23 0500   Weight: 63.5 kg (139 lb 15.9 oz) 64.7 kg (142 lb 10.2 oz) 65.8 kg (145 lb 1 oz)           Intake/Output Summary:  (Last 24 hours)    Intake/Output Summary (Last 24 hours) at 12/02/2023 9395  Last data filed at 12/02/2023 0600  Gross per 24 hour   Intake 3861.03 ml   Output 256 ml   Net 3605.03 ml         Physical Exam:      General: ill-appearing  HEENT: normocephalic, missing teeth, ETT in place  Lungs: coarse bilaterally w/ diminished bases, non-labored  Heart: S1, S2; RRR  Abdomen: soft, hypoactive bowel sounds, PEG in place  Extremities: no peripheral edema  Skin: warm, dry; no rashes or lesions to exposed skin  Neurologic: obtunded, triple flex to painful stimuli BLD, flicker to pain BUE, + cough, - corneal, no eye opening    Artificial airway:  Endotracheal Tube  Ventilator/ Respiratory Therapy:  Yes: Invasive Mode: V/AC+  Set VT (mL):  [400 milliliters]   Expired VT Spontaneous (mL):  [0 milliliters]   Expiratory VT (mL):  [371 mL-537 mL]   Set RR:  [18 breaths/minutes]   Total Respiratory Rate:  [22 breaths/minutes-24 breaths/minutes]   Minute Volume (Lpm):  [11.4 liters/minutes-11.7 liters/minutes]   %MVspontaneous:  [0 %]   PIP Actual (cmH2O):  [5.4 cm H20-6 cm H20]   PEEP/CPAP (cmH2O):  [5 cm H2O]   Mean Airway Pressure (cmH2O):  [5 cm H2O]      Vent weaning trial:  Per protocol      Laboratory:  LABS:  Recent Labs     12/01/23  1721 12/01/23  1729 12/01/23  2041 12/02/23  0239   NA  --  128* 129* 131*   K  --  4.3 4.6 5.0   CL  --  92* 93* 94*   CO2  --  21 20* 18*   GAP  --  15* 16* 19*   BUN  --  80* 74* 75*   CR 4.6* 4.37* 4.33* 4.67*   GLU  --  557* 502* 308*   CA  --  7.9* 7.7* 7.5*   ALBUMIN  --  2.9*  --  2.9*   MG  --  2.0  --  2.0   PO4  --   --   --  4.7*        Recent Labs     11/29/23  0633 12/01/23  1729 12/01/23  2121 12/02/23  0239   WBC 12.40* 11.00  --  11.80*   HGB 9.5* 7.7*  --  9.4*   HCT 27.3* 21.5*  --  26.6*   PLTCT 235 165  --  173 PT  --   --  13.4  --    INR  --   --  1.2  --    PTT  --   --  28.3  --    AST  --  77*  --  100*   ALT  --  102*  --  93*   ALKPHOS  --  69  --  71      Estimated Creatinine Clearance: 12.7 mL/min (A) (based on SCr of 4.67 mg/dL (H)).  Vitals:    12/01/23 1720 12/01/23 2022 12/02/23 0500   Weight: 63.5 kg (139 lb 15.9 oz) 64.7 kg (142 lb 10.2 oz) 65.8 kg (145 lb 1 oz)      Recent Labs     12/01/23  2125 12/02/23  0252   PHART 7.38 7.41   PO2ART 87 102*                 Radiology and Other Diagnostic Procedures Review:    Reviewed pertinent studies.

## 2023-12-02 NOTE — Progress Notes
 A routine ?65 ?minute inpatient EEG was completed without complications.      The quality and integrity of the electrode connection has been checked and is within the designated range of below 5kOhms. Activations were performed per protocol.    After the test, the scalp was inspected for any skin breakdown and was cleaned with warm water  and a washcloth. Education was given to the Patient's family about proper hair care after the EEG.

## 2023-12-02 NOTE — Consults
 CLINICAL NUTRITION                                                        Clinical Nutrition Initial Assessment    Name: Kevin Obrien   MRN: [id number]     DOB: Feb 25, 1951      Age: 73 y.o.  Admission Date: 12/01/2023     LOS: 1 day     Date of Service: 12/02/2023        Recommendation:  REC volume based critical care feeding of Vital AF 1.2 with goal rate of 60 ml/hr to provide at goal rate 1728 calories, 108 grams protein, and 1166 ml free H2O.    24-hr goal volume 1440 ml and 4-hr goal volume 240 ml.   Additional fluids per primary team with minimum of 30 ml H2O q4hrs for tube patency.    The nutrition-related order modifications are made in communication with the primary service, who remains responsible for the orders and overall care of the patient.        Comments:  73 y.o. male with PMH of recent Acute R pontomedullary infarct, CAD s/p PCI, GI bleed, BPH, DM2, shingles, HTN, and HLD, who presented to [facility] ED from SNF on 6/22 after found to be obtunded by his daughters. Since DC from [place] to [facility] 6/20, he was felt to have continuously declining mental status w/ new O2 requirements of 3LNC, difficulty managing glucose and worsening UOP. Pt intubated on arrival for airway protection. CT head w/ new large bilateral subacute occipitotemporal and moderate R basal ganglial infarcts. On insulin  gtt, norepinephrine  gtt, propofol  (currently off).         RD consult received for TF recommendations. Pt has PEG tube; last d/c'd on home regimen of 5 cartons of Jevity 1.5 per day. Will plan for volume based critical care feeding at this time.    Nutrition Assessment of Patient:  Admit Weight: 65.8 kg;  ;    BMI (Calculated): 22.05;      Pertinent Allergies/Intolerances: NKFA     Oral Diet Order: NPO;    Current EN Order: None ordered              Weight Used for Calculation: 65.8 kg  Estimated Calorie Needs: 1612-1791 kcal (PSU x 0.9-1.0  Estimated Protein Needs: 79-132 g PRO (1.2-2.0 g/kg)    Malnutrition Assessment: Malnutrition present on admission  ICD-10 code E44: Acute illness/Moderate non-severe malnutrition  Mild loss of body fat, Mild loss of muscle mass              Malnutrition Interventions: Resume TF    Nutrition Focused Physical Exam:  Loss of Subcutaneous Fat: Yes; Severity: Mild; Location: Buccal  Muscle Wasting: Yes; Severity: Mild; Location: Clavicle    Physical Assessment:        Pressure Injury: None noted     Comment: + BM today    Nutrition Diagnosis:  Inadequate oral intake  Etiology: current condition  Signs & Symptoms: intubated, need for TF to meet est needs                      Intervention / Plan:  Monitor TF tolerance/provision, I/Os, labs, weight trends, skin integrity          Kevin Obrien, RD, LD, CNSC  Available  on Voalte

## 2023-12-02 NOTE — Progress Notes
 OCCUPATIONAL THERAPY  NOTE   Name: Kevin Obrien   MRN: [id number]     DOB: 12/08/1950      Age: 73 y.o.  Admission Date: 12/01/2023     LOS: 1 day     Date of Service: 12/02/2023      OT/PT orders received and appreciated. Based on chart review and discussion with bedside RN, pt is not appropriate for skilled OT/PT intervention at this time. Pt intubated and sedated and unable to appropriately participate in functional mobility. OT/PT will continue to follow and provide intervention as pt able/appropriate.     Therapist: Lauraine Blas, OTR/L  Date: 12/02/2023

## 2023-12-03 ENCOUNTER — Inpatient Hospital Stay: Admit: 2023-12-03 | Discharge: 2023-12-03 | Payer: MEDICARE

## 2023-12-03 ENCOUNTER — Encounter: Admit: 2023-12-03 | Discharge: 2023-12-03 | Payer: MEDICARE

## 2023-12-03 LAB — POC GLUCOSE
~~LOC~~ BKR POC GLUCOSE: 113 mg/dL — ABNORMAL HIGH (ref 70–100)
~~LOC~~ BKR POC GLUCOSE: 129 mg/dL — ABNORMAL HIGH (ref 70–100)
~~LOC~~ BKR POC GLUCOSE: 153 mg/dL — ABNORMAL HIGH (ref 70–100)
~~LOC~~ BKR POC GLUCOSE: 157 mg/dL — ABNORMAL HIGH (ref 70–100)
~~LOC~~ BKR POC GLUCOSE: 167 mg/dL — ABNORMAL HIGH (ref 70–100)
~~LOC~~ BKR POC GLUCOSE: 175 mg/dL — ABNORMAL HIGH (ref 70–100)
~~LOC~~ BKR POC GLUCOSE: 180 mg/dL — ABNORMAL HIGH (ref 70–100)
~~LOC~~ BKR POC GLUCOSE: 183 mg/dL — ABNORMAL HIGH (ref 70–100)
~~LOC~~ BKR POC GLUCOSE: 188 mg/dL — ABNORMAL HIGH (ref 70–100)
~~LOC~~ BKR POC GLUCOSE: 190 mg/dL — ABNORMAL HIGH (ref 70–100)
~~LOC~~ BKR POC GLUCOSE: 195 mg/dL — ABNORMAL HIGH (ref 70–100)
~~LOC~~ BKR POC GLUCOSE: 203 mg/dL — ABNORMAL HIGH (ref 70–100)
~~LOC~~ BKR POC GLUCOSE: 205 mg/dL — ABNORMAL HIGH (ref 70–100)
~~LOC~~ BKR POC GLUCOSE: 206 mg/dL — ABNORMAL HIGH (ref 70–100)
~~LOC~~ BKR POC GLUCOSE: 211 mg/dL — ABNORMAL HIGH (ref 70–100)
~~LOC~~ BKR POC GLUCOSE: 213 mg/dL — ABNORMAL HIGH (ref 70–100)
~~LOC~~ BKR POC GLUCOSE: 218 mg/dL — ABNORMAL HIGH (ref 70–100)
~~LOC~~ BKR POC GLUCOSE: 226 mg/dL — ABNORMAL HIGH (ref 70–100)

## 2023-12-03 LAB — PHOSPHORUS  CELLULAR THERAPEUTICS: ~~LOC~~ BKR PHOSPHORUS: 3.5 mg/dL — ABNORMAL HIGH (ref 2.0–4.5)

## 2023-12-03 LAB — BASIC METABOLIC PANEL
~~LOC~~ BKR ANION GAP: 17 — ABNORMAL HIGH (ref 3–12)
~~LOC~~ BKR BLD UREA NITROGEN: 77 mg/dL — ABNORMAL HIGH (ref 7–25)
~~LOC~~ BKR CALCIUM: 7.9 mg/dL — ABNORMAL LOW (ref 8.5–10.6)
~~LOC~~ BKR CHLORIDE: 100 mmol/L (ref 98–110)
~~LOC~~ BKR CHLORIDE: 103 mmol/L — ABNORMAL HIGH (ref 98–110)
~~LOC~~ BKR CO2: 17 mmol/L — ABNORMAL LOW (ref 21–30)
~~LOC~~ BKR CREATININE: 5.3 mg/dL — ABNORMAL HIGH (ref 0.40–1.24)
~~LOC~~ BKR GLOMERULAR FILTRATION RATE (GFR): 11 mL/min — ABNORMAL LOW (ref >60)
~~LOC~~ BKR GLUCOSE, RANDOM: 140 mg/dL — ABNORMAL HIGH (ref 70–100)
~~LOC~~ BKR GLUCOSE, RANDOM: 221 mg/dL — ABNORMAL HIGH (ref 70–100)
~~LOC~~ BKR POTASSIUM: 3.4 mmol/L — ABNORMAL LOW (ref 3.5–5.1)
~~LOC~~ BKR SODIUM, SERUM: 134 mmol/L — ABNORMAL LOW (ref 137–147)
~~LOC~~ BKR SODIUM, SERUM: 135 mmol/L — ABNORMAL LOW (ref 137–147)

## 2023-12-03 LAB — COMPREHENSIVE METABOLIC PANEL
~~LOC~~ BKR POTASSIUM: 3.3 mmol/L — ABNORMAL LOW (ref 3.5–5.1)
~~LOC~~ BKR SODIUM, SERUM: 134 mmol/L — ABNORMAL LOW (ref 137–147)

## 2023-12-03 LAB — BLOOD GASES, ARTERIAL
~~LOC~~ BKR BASE DEFICIT-ART: 6.4 mmol/L — ABNORMAL LOW (ref 6.0–8.0)
~~LOC~~ BKR O2 SAT-ART: 99 % — ABNORMAL HIGH (ref 95.0–99.0)
~~LOC~~ BKR PCO2-ART: 24 mmHg — ABNORMAL LOW (ref 35–45)
~~LOC~~ BKR PH-ART: 7.4 mg/dL — ABNORMAL HIGH (ref 7.35–7.45)
~~LOC~~ BKR PO2-ART: 126 mmHg — ABNORMAL HIGH (ref 80–100)

## 2023-12-03 LAB — MAGNESIUM  CELLULAR THERAPEUTICS: ~~LOC~~ BKR MAGNESIUM: 1.9 mg/dL (ref 1.6–2.6)

## 2023-12-03 LAB — HIGH SENSITIVITY TROPONIN I, RANDOM: ~~LOC~~ BKR HIGH SENSITIVITY TROPONIN I: 310 ng/L — ABNORMAL HIGH (ref 3.5–<20)

## 2023-12-03 LAB — SHIGA TOXINS EIA

## 2023-12-03 MED ORDER — POTASSIUM CHLORIDE 20 MEQ/15 ML PO LIQD
40 meq | Freq: Once | ORAL | 0 refills | Status: CP
Start: 2023-12-03 — End: ?
  Administered 2023-12-03: 11:00:00 40 meq via ORAL

## 2023-12-03 NOTE — Progress Notes
 Principal Problem:    Acute respiratory failure (CMS-HCC)  Active Problems:    Essential hypertension    Dyslipidemia    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)    Ischemic stroke (CMS-HCC)    Acute encephalopathy    On mechanically assisted ventilation (CMS-HCC)    Aspiration pneumonia (CMS-HCC)    NSTEMI (non-ST elevated myocardial infarction) (CMS-HCC)    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 (CMS-HCC)    Anemia    History of gastrointestinal diverticular hemorrhage    Acute kidney injury    Elevated liver enzymes    Coronary artery disease    History of coronary artery stent placement    Diarrhea    Elevated creatine kinase level    Lactic acidosis    Sepsis, unspecified organism (CMS-HCC)    Elevated brain natriuretic peptide (BNP) level    Prolonged QT interval     Gillie Crisci is a 73 y.o. male with PMH of recent Acute R pontomedullary infarct, CAD s/p PCI, hx GI bleed, DM2 admitted from SNF on 6/22 with acute/subacute large right occipitotemporal and left temporoparietal occipital stroke, aspiration pneumonia, AKI, and septic shock.  Remains on vent support with acute encephalopathy needing airway protection.   Continue ASA and plavix  in setting of stroke and concern for NSTEMI. Appreciate neurology team input.   Continue zosyn  for pneumonia.   BG control with insulin  drip.   Continue vasopressor for BP targets as per neuro team. Oliguric renal failure likely secondary to sepsis.   Overall prognosis appears poor which I discussed with the family. Discussions regarding goals of care ongoing. Plan for palliative care consult.    The patient is critically ill with problems listed above.   I spent 40 mins time in addition to time spent by the NP performing critical care services including medication and record review, abx review, vasopressor mngt, fluid and electrolyte mngt, vent mngt, serial assessment and coordination of care.     [name], MD

## 2023-12-03 NOTE — Progress Notes
 Critical Care   Progress Note     Kevin Obrien  Today's Date:  12/03/2023  Admission Date: 12/01/2023  LOS: 2 days    Principal Problem:    Acute respiratory failure (CMS-HCC)  Active Problems:    Essential hypertension    Dyslipidemia    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)    Ischemic stroke (CMS-HCC)    Acute encephalopathy    On mechanically assisted ventilation (CMS-HCC)    Aspiration pneumonia (CMS-HCC)    NSTEMI (non-ST elevated myocardial infarction) (CMS-HCC)    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 (CMS-HCC)    Anemia    History of gastrointestinal diverticular hemorrhage    Acute kidney injury    Elevated liver enzymes    Coronary artery disease    History of coronary artery stent placement    Diarrhea    Elevated creatine kinase level    Lactic acidosis    Sepsis, unspecified organism (CMS-HCC)    Elevated brain natriuretic peptide (BNP) level    Prolonged QT interval        Assessment/Plan:      Hospital Course: Kevin Obrien is a 72 y.o. 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 Several recent admissions since May 2025; most recently admitted 6/12 - 6/20 for GIB; dc'd to SNF. Presented to ED for AMS. GCS 3 so intubated for airway protection. Initial labs significant for elevated troponin, AKI, & hyperglycemia. CT head revealed new large bilateral subacute occipitotemporal & moderate R basal ganglia infarcts. CT chest c/f pneumonia. Given IVF & abx. Admit to MICU.     Neurology following & assisting w/ stroke management; plan for serial imaging. Neuro exam poor off sedation.  Troponin now downtrending but does have e/o new WMA & acutely reduced EF on echo; managing medically for now. Infectious work-up in process; remains on abx.      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  Cerebral Atherosclerosis  - Likely multifactorial d/t new strokes, metabolic derangements in the setting of AKI, & infection  - Suspect new strokes likely d/t ICAD w/ cerebral hypoperfusion etiology  - PTA Plavix  75mg  daily, ASA 81mg  daily, melatonin 5mg , Remeron  15mg  daily HS, Modafinil  200mg  daily AM then 100mg   - Admit May 2025 w/ acute R pontomedullary brainstem infarct; planned for 3 months DAPT followed by aspirin  monotherapy  - Baseline non-verbal post CVA 5/9 w/ L side deficits (decreased ROM/attempting fine motor), assisted ambulation; more recently required 1-2 assist and predominantly bed bound in last week   - GCS 3 on ED arrival -- intubated for airway protection  - UDS & alcohol negative 6/22  - CT head 6/22: Development of large subacute appearing bilateral occipitotemporal & moderate sized right corona radiata/capsular/basal ganglia infarcts. No e/o hemorrhage conversion, midline shift, or herniation. Stable chronic pontomedullary infarcts, basal ganglia capsular infarct, lacunar infarcts.  - MRI brain 6/23: Development of large bilateral acute-early subacute infarcts involving the right occipitotemporal region & left temporoparietal occipital region. Large areas of acute/early subacute infarct involving the bilateral corona radiata & centrum semiovale w/ scattered puntatate cortical infarcts. No herniation or hydrocephalus. Findings c/w superimposed microhemorrhage within b/l cerebral hemispheres & withiin areas of infarct. Additional acute-early subacute infarcts within the bilateral cerebral hemispheres, cerebral peduncles, & brainstem.  -  MRA head 6/23: Persistent occlusion of the proximal right intracranial vertebral arterial. Persistent occlusion of the proximal right intracranial vertebral artery. Multifocal intracranial arterial luminal stenoses, including the right paraclinoid internal carotid artery, right middle cerebral, and bilateral   posterior cerebral arteries, without large vessel occlusion.   - MRA neck 6/23: Severe stenosis of the right V1 segment. Mild multifocal luminal   irregularity the left cervical vertebral artery. Moderate stenosis of the mid left ICA with otherwise mild multifocal irregularity of the bilateral CCAs and cervical ICAs  - CT head 6/23 redemonstrates multifocal evolving recent cerebral infarcts   - EEG 6/23 c/w moderate-severe encephalopathy   - On exam obtunded, not following commands, small degree triple flexion in LEs    PLAN  - Neurology following  - Q1 neurochecks  - SBP goal 120 - 200  - Corrected Na goal 140   - Continue PTA plavix  & ASA w/ new infarcts  - Hold remaining PTA medications  Sedation for Mechanical Ventilation   - Propofol  off since 6/23  PLAN  - Fentanyl  12.13mcg Q2 PRN      PULM  Acute Hypoxic Respiratory Failure  Aspiration PNA  Mechanically Ventilated  - C/f atelectasis vs aspiration PNA noted in previous admission 5/8-5/17  - CXR 6/20 prior to discharge to SNF w/ RLL opacities w/ c/f PNA  - New oxygen requirement of 3L at SNF on 6/21  - Intubated for airway protection on ED arrival   - CXR 6/22: Patchy right basilar opacities, likely pneumonia vs aspiration. Low lung volumes w/ streaky left basilar opacities, likely atelectasis.atelectasis, RLL consolidation c/f PNA  - CT chest 6/22: Bilateral lower lobe consolidations with additional scattered patchy nodular opacities consistent with bilateral lower lobe pneumonia.     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 Segmental WMA w/ hypokinesis of apex & anterior lateral wall. Grade II LV diastolic dysfunction. RV size & function normal. No valvular issues.   - Currently NE (0.1)  - HR 80s  PLAN  - NE; SBP >120  - DC troponins w/ continued downtrend   - Continue PTA ASA 81mg  daily as above  - Hold PTA medications     GI  Dysphagia s/p PEG  Recent GIB  Mild Transaminitis  - Admit 6/12-6/20 w/ hematochezia; EGD unremarkable, colonoscopy w/ no active bleed. Bleeding ultimately resolved & resumed DAPT.  - CT abd/plvs 6/22: mild thickening of distal ileum suggesting nonspecific infectious or inflammatory enteritis. No significant ascites. Moderate diverticulosis coli, most prominent in the proximal sigmoid.   - Last BM 6/23  PLAN  - NPO; continue TFs   - Miralax  daily & senna BID   - PPI     RENAL  AKI  HAGMA  Lactic Acidosis (resolved)   Hypochloremia (resolved)   - Ddx pre-renal  in the setting of reported diarrhea PTA vs ATN d/t sepsis  - Baseline Cr 0.8; on admit Cr 4.33  - FENa 1%, indeterminate   - CT a/p w/ no hydro or renal stones  - Cr 5.53, AG 17, CO2 17   - I/O: +1.4L & UO   PLAN  - BMP BID  - Strict I/Os  Elevated CPK   - CPK 2,533 --> 3,129 --> 971   - S/p 2L IVF in ED    ENDO  DMT2  Hyperglycemia  - PTA Aspart SSI, Lanus 20u daily   - Most recent A1c 5/8 - 11.7  - BHB 0.2  - Glucose > 500 on admit  PLAN  - Insulin  gtt (337 units/24hrs)      ID  PNA  Sepsis  UTI  Diarrhea  Hx E Coli UTI   - Prior E Coli UTI resistant to ampicillin & trimethsulfa; intermediate to Unasyn  - Reported diarrhea PTA  - BC 6/22 NGTD   - BC 6/23 NGTD   - UA 6/22 w/ negative nitrites, trace leuks, 20-50 WBCs; cx NGTD   - Fecal cx 6/22 pending  - Giardia negative  - Cryptosporidium negative  - C Diff 6/22 negative  - MRSA & RVP 6/22 negative  - Sputum cx 6/24 pending  - Procal 6/22: 51  - WBC 12.1, febrile (tmax 100.42F)  PLAN  - Continue zosyn      HEME  Chronic anemia  - Hgb 9, plt 195  - Heparin  for VTE ppx     MSK/SKIN  DTI, sacrum/coccyx and L heel  - Wound team consult placed    Prophylaxis Review:  Lines: Yes PIV & ART line   Tubes: ETT/Flexi/G tube   Insulin : Yes  Urinary Catheter: Yes  VTE ppx: Heparin ; SCDs  GI ppx: PPI    Code status: FULL   Disposition: ICU    Pt discussed with Dr. Nada     - Pt critically ill with the above diagnoses. I spent 60 minutes providing critical care services including:  reviewing outside records and obtaining history from the patient/family members  performing a physical examination  serially reviewing laboratory, telemetry, hemodynamic, oximetry, and respiratory data  reviewing radiographic images  reviewing medications  managing fluids/electrolytes, antibiotics, ICU prophylaxis, and mechanical ventilation   developing the overall plan of care.    ROEL Solian, APRN   Pulmonary Critical Care   Pager [phone]   12/03/2023    M2 team pager (2nd call/nights) [phone]      __________________________________________________________________________________    Subjective:        Kevin Obrien is a 73 y.o. male who unable to obtain d/t above     Objective:        Medications:  Scheduled Meds:albuterol -ipratropium (DUONEB) nebulizer solution 3 mL, 3 mL, Inhalation, Q4H & PRN  aspirin  chewable tablet 81 mg, 81 mg, Per G Tube, QDAY  clopiDOGreL  (PLAVIX ) tablet 75 mg, 75 mg, Per G Tube, QDAY  heparin  (porcine) PF syringe 5,000 Units, 5,000 Units, Subcutaneous, Q8H*  pantoprazole  (PROTONIX ) injection 40 mg, 40 mg, Intravenous, QDAY  piperacillin /tazobactam (ZOSYN ) 4.5 g in sodium chloride  0.9% (NS) 100 mL IVPB (MB+), 4.5 g, Intravenous, Q12H*  polyethylene glycol 3350  (MIRALAX ) packet 17 g, 1 packet, Per G Tube, QDAY  senna (SENOKOT) oral syrup 8.8 mg, 8.8 mg, Per G Tube, BID  thiamine  mononitrate (vit B1) tablet 100 mg, 100 mg, Per G Tube, QDAY    Continuous Infusions:   Diet  Critical Care Enteral Feeding Volume Based Infusion 40 mL/hr at 12/03/23 0259    insulin  regular 100 units/NS 100 mL IV drip (premade) 16 Units/hr (12/03/23 0400)    norepinephrine  (LEVOPHED ) 4 mg in dextrose  5% (D5W) 250 mL IV drip (std conc) 0.1 mcg/kg/min (12/03/23 0518)    propofoL  (DIPRIVAN ) 10 mg/mL IV drip Stopped (12/02/23 0414)     PRN and Respiratory Meds:acetaminophen  Q4H PRN, fentaNYL  citrate PF Q2H PRN, pancrelipase  20,880 Units/sodium bicarbonate  650 mg ([place] CLOG DESTROYER) PRN (On Call from Rx)                       Vital Signs: Last Filed                  Vital Signs: 24 Hour Range   BP: 121/54 (06/23 1815)  Temp: 38.1 ?C (100.6 ?F) (06/24 0300)  Pulse: 91 (06/24 0411)  Respirations: 25 PER MINUTE (06/24 0411)  SpO2: 98 % (06/24 0411)  O2%: 30 % (06/24 0411)  O2 Device: Ventilator (06/24 0411) BP: (121)/(54)   ABP: (108-164)/(34-52)   Temp:  [36.8 ?C (98.2 ?F)-38.1 ?C (100.6 ?F)]   Pulse:  [78-100]   Respirations:  [20 PER MINUTE-28 PER MINUTE]   SpO2:  [97 %-99 %]   O2%:  [30 %]   O2 Device: Ventilator     Vitals:    12/01/23 2022 12/02/23 0314 12/02/23 0500   Weight: 64.7 kg (142 lb 10.2 oz) 65.8 kg (145 lb) 65.8 kg (145 lb 1 oz)           Intake/Output Summary:  (Last 24 hours)    Intake/Output Summary (Last 24 hours) at 12/03/2023 9471  Last data filed at 12/03/2023 0500  Gross per 24 hour   Intake 1995.78 ml   Output 426 ml   Net 1569.78 ml         Physical Exam:         General: No distress, appears stated age  Lungs:  Clear upper & diminished lower bilaterally; no wheeze  Heart:  Regular rate and rhythm, S1, S2 normal, no murmur appreciated  Abdomen:  Soft, non-tender.  Bowel sounds normal  Neurologic:  Obtunded      Artificial airway:  Endotracheal Tube                                                                                       Ventilator/ Respiratory Therapy:  Yes: Invasive Mode: MMV  Set VT (mL):  [400 milliliters]   Expired VT Spontaneous (mL):  [449 milliliters]   Expiratory VT (mL):  [449 mL]   Set RR:  [18 breaths/minutes]   Total Respiratory Rate:  [25 breaths/minutes]   Minute Volume (Lpm):  [12.5 liters/minutes]   %MVspontaneous:  [100 %]   PIP Actual (cmH2O):  [11 cm H20]   PEEP/CPAP (cmH2O):  [5 cm H2O]   PS (cmH2O):  [5 cm H20]   Mean Airway Pressure (cmH2O):  [7 cm H2O]      Vent weaning trial:  Not applicable    Laboratory:  LABS:  Recent Labs     12/01/23  1721 12/01/23  1729 12/01/23  2041 12/02/23  0239 12/02/23  0933 12/02/23  1548 12/02/23  2000 12/03/23  0355   NA  --  128* 129* 131* 131* 134* 134* 134*   K  --  4.3 4.6 5.0 3.7 3.5 3.4* 3.3*   CL  --  92* 93* 94* 98 100 100 100   CO2  --  21 20* 18* 17* 18* 17* 17*   GAP  --  15* 16* 19* 16* 16* 17* 17*   BUN  --  80* 74* 75* 75* 76* 77* 77*   CR 4.6* 4.37* 4.33* 4.67* 4.81* 5.04* 5.34* 5.53*   GLU  --  557* 502* 308* 273* 163* 140* 170*   CA  --  7.9* 7.7* 7.5* 7.4* 7.3* 7.9* 7.6*   ALBUMIN  --  2.9*  --  2.9*  --   --   --  2.6*   MG  --  2.0  --  2.0  --   --   --  1.9   PO4  --   --   --  4.7*  --   --   --  3.5   HGBA1C  --   --   --  8.9*  --   --   --   --        Recent Labs     12/01/23  1729 12/01/23  2121 12/02/23  0239 12/03/23  0355   WBC 11.00  --  11.80* 12.10*   HGB 7.7*  --  9.4* 9.0*   HCT 21.5*  --  26.6* 24.7*   PLTCT 165  --  173 195   PT  --  13.4  --   --    INR  --  1.2  --   --    PTT  --  28.3  --   --    AST 77*  --  100* 70*   ALT 102*  --  93* 92*   ALKPHOS 69  --  71 82      Estimated Creatinine Clearance: 10.7 mL/min (A) (based on SCr of 5.53 mg/dL (H)).  Vitals:    12/01/23 2022 12/02/23 0314 12/02/23 0500   Weight: 64.7 kg (142 lb 10.2 oz) 65.8 kg (145 lb) 65.8 kg (145 lb 1 oz)      Recent Labs     12/02/23  0252 12/03/23  0355   PHART 7.41 7.44   PO2ART 102* 126*           Radiology and Other Diagnostic Procedures Review:    Reviewed     Malnutrition   Malnutrition present on admission  ICD-10 code E44: Acute illness/Moderate non-severe malnutrition  Mild loss of body fat, Mild loss of muscle mass            Loss of Subcutaneous Fat: Yes Mild Buccal  Muscle Wasting: Yes Mild Clavicle               Malnutrition Interventions: Resume TF

## 2023-12-03 NOTE — Consults
 PALLIATIVE CARE INPATIENT NOTE     Name: Kevin Obrien            MRN: [id number]                DOB: 05-08-1951          Age: 73 y.o.  Admission Date: 12/01/2023             LOS: 2 days    ASSESSMENT/PLAN     Kevin Obrien is a 73 y.o. 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 Additionally he was found to have aspiration pna, AKI and septic shock. Palliative medicine is consulted to assist with goals of care.     #Acute/subacute R occipitotemporal and L temporoparietal occipital stroke  #Recent R pontomedullary stroke  #Recent GI bleed  #AKI  #Septic shock  #Aspiration pna  #Requiring mechanical ventilation for airway protection  #Goals of care    Discussion:    Palliative team (myself, Pam APRN) met with patient and family at bedside. Pt's daughter Kevin Obrien and Kevin Obrien and granddaughter Kevin Obrien are present. Kevin Obrien is intubated and unresponsive, unable to participate in discussion.     We introduced our team and our role patient's care including symptom management, goals of care.  Family has had recent discussions with ICU and neurology teams regarding findings clinical status.  They spoke with neurology team regarding this case, worst-case, most likely scenario.  They have recall potential for large deficits in functional status, likely be dependent on others for care and may not be able to swallow, speak or recognize family. His family is gaining more information about what care options are available moving forward. They did talk with ICU team today regarding what comfort care would look like and they have more questions about this during our visit.     Family reports prior to his initial stroke last month, he is very active, able to take care of himself independently, went to the store with family members, very social.  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  When he was in rehab following his stroke in May, he was very motivated to get better and worked very hard with therapy as being dependent on others was very difficult for him. Kevin Obrien and Kevin Obrien share they think if he could talk to us  right now, he would be upset at the state his body is in and how dependent he is on others.  Discussed her hope is to provide medical information, get to know a lot better through his family figure out what unacceptable quality of life means to him.  Our hope is that we would be providing medical care that would ultimately align with his wishes.    Family asked for more information about comfort plan.  We discussed initial transition would happen here in the hospital.  Discussed comfort medications and liberation from life support.  Time after liberation from life support can sometimes be unpredictable.  If he was stable and prognosis was thought to be longer after transition to comfort plan we could look at hospice support. Level of hospice care ie inpatient vs routine would depend on symptom needs. Discussed this in more detail with family. They think his preference would be to pass at home but they also mention pt lives  with 75yo family member and may be difficult for her to handle. Discussed hospice setting would really depend on how his body does off life support as time could be short.     Family is appreciative of the information and is considering options. They report plan to repeat head imaging the next two days and keep taking things one day at a time.     RECOMMENDATIONS:   DNR-FI - noted change by ICU team prior to our visit   Discussed comfort approach - they are considering options   Family able to state pt lives a very active life and did not like being dependent on others  Appreciate ICU and neurology teams sharing prognostic information with family   We will continue to follow for ongoing goals conversations and support    PALLIATIVE CARE PLANNING     Advance Care Planning:   Identified Health Care Decision Maker:  Kevin Obrien Kevin Obrien are listed as emergency contact  DPOA - Would benefit from completion of DPOA - unable to obtain  TPOPP - Not discussed    PC Clinic - NA    Medication safety - NA    Disposition planning: TBD       SUBJECTIVE     CC/Reason for Visit:goals of care    Additional history from: ICU APRN, chart review, bedside RN, family    History of Present Illness: Kevin Obrien is a 73 y.o. 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 Additionally he was found to have aspiration pna, AKI and septic shock. Palliative medicine is consulted to assist with goals of care.     Per chart review, patient had a recent stroke in May and was discharged to rehab.  He then went back to the hospital for GI bleed and was discharged to skilled nursing facility on 6/20.  He then returned to [place] on 6/22 after being found obtunded by his daughters.  On arrival to [place], his GCS was 3 and he was intubated for airway protection and respiratory failure.  Head imaging showed subacute large infarcts and he was admitted to the ICU.    Past Medical History:    Accidental fall    Angina pectoris    Arthritis    BPH (benign prostatic hyperplasia)    Cataract    Constipation    Coronary artery disease    DM (diabetes mellitus) (CMS-HCC)    ED (erectile dysfunction)    Heart attack (CMS-HCC)    Hyperlipidemia    Hypertension    Joint pain    Neuropathy    Shingles     Surgical History:   Procedure Laterality Date    CORONARY STENT PLACEMENT  2016    HX CHOLECYSTECTOMY  09/2015    COLONOSCOPY N/A 04/20/2016    Performed by Kevin Kipper, MD at [facility] ENDO    ESOPHAGOGASTRODUODENOSCOPY N/A 04/20/2016    Performed by Kevin Kipper, MD at [facility] ENDO ESOPHAGOGASTRODUODENOSCOPY BIOPSY  04/20/2016    Performed by Kevin Kipper, MD at [facility] ENDO    UREA BREATH TEST N/A 11/13/2016    Performed by Ledora Catalina, MD at [facility] ENDO    ESOPHAGOGASTRODUODENOSCOPY WITH BIOPSY - FLEXIBLE N/A 11/22/2023    Performed by Bonnetta Camellia BROCKS, MD at [facility] ENDO    COLONOSCOPY WITH BIOPSY - FLEXIBLE N/A 11/22/2023    Performed by Bonnetta Camellia  C, MD at [facility] ENDO     Social History     Tobacco Use    Smoking status: Former     Current packs/day: 0.00     Average packs/day: 2.0 packs/day for 10.0 years (20.0 ttl pk-yrs)     Types: Cigarettes     Start date: 04/11/1961     Quit date: 04/12/1971     Years since quitting: 52.6    Smokeless tobacco: Never    Tobacco comments:     Former smoker/quit years ago   Psychologist, educational Use    Vaping status: Never Used   Substance Use Topics    Alcohol use: Not Currently    Drug use: Never     Social History     Social History Narrative    Not on file     Family History:   Family History   Problem Relation Name Age of Onset    Diabetes Mother AMPARO CUBIAS     Diabetes Father JOSE Mednick     Cancer-Lung Father JOSE Fennewald     Melanoma Neg Hx      Cataract Neg Hx      Glaucoma Neg Hx      Macular Degen Neg Hx       Family Status   Relation Name Status    Mother AMPARO CUBIAS Deceased    Father JOSE Bhandari Deceased    Neg Hx  (Not Specified)   No partnership data on file         OBJECTIVE     Blood pressure 125/51, pulse 92, temperature (!) 38 ?C (100.4 ?F), height 172.7 cm (5' 8), weight 65.8 kg (145 lb 1 oz), SpO2 98%.  Physical Exam  General: critically ill, unresponsive  HEENT: Eyes closed, ETT in place  CV: Regular rate  Pulm: Non-labored respirations, mechanically ventilated  GI: Abd non-distended, non-tender to palpation, bowel sounds active, G tube in place  Neuro: unresponsive, not on sedation      Lab Results:  CBC   Lab Results   Component Value Date/Time    WBC 12.10 (H) 12/03/2023 03:55 AM    HGB 9.0 (L) 12/03/2023 03:55 AM    PLTCT 195 12/03/2023 03:55 AM Lab Results   Component Value Date/Time    NEUT 85.1 (H) 12/03/2023 03:55 AM    ANC 10.30 (H) 12/03/2023 03:55 AM      Chemistries   Lab Results   Component Value Date/Time    NA 134 (L) 12/03/2023 03:55 AM    K 3.3 (L) 12/03/2023 03:55 AM    BUN 77 (H) 12/03/2023 03:55 AM    CR 5.53 (H) 12/03/2023 03:55 AM    GLU 170 (H) 12/03/2023 03:55 AM     Lab Results   Component Value Date/Time    CA 7.6 (L) 12/03/2023 03:55 AM    PO4 3.5 12/03/2023 03:55 AM    ALBUMIN 2.6 (L) 12/03/2023 03:55 AM    TOTPROT 5.4 (L) 12/03/2023 03:55 AM    ALKPHOS 82 12/03/2023 03:55 AM    AST 70 (H) 12/03/2023 03:55 AM    ALT 92 (H) 12/03/2023 03:55 AM    TOTBILI 0.4 12/03/2023 03:55 AM    GFR 10 (L) 12/03/2023 03:55 AM    GFRAA >60 02/22/2019 06:28 PM        Other Pertinent Diagnostic Results:   CT Head 06/23: Redemonstration of multifocal evolving recent cerebral infarcts without gross hemorrhagic conversion. Posterior fossa and brainstem infarcts are seen to better advantage  on MRI modality. Stable mild generalized cerebral volume loss and moderate nonspecific white matter disease, most likely due to chronic macrovascular ischemia, with several small superimposed chronic infarcts.      CT head 06/22: Development of large subacute appearing bilateral occipitotemporal and moderate sized right corona radiata/capsular/basal ganglia infarcts. No evidence of hemorrhagic conversion. No midline shift or herniation.  Stable chronic pontomedullary infarcts, left basal gallbladder-capsular infarct, and old lacunar infarcts, better demonstrated on prior MRI.     MR brain:   1.  Development of large bilateral acute-early subacute infarcts involving   the right occipitotemporal region and left temporoparietal occipital   region. Additional large areas of acute/early subacute infarct involving   the bilateral corona radiata and centrum semiovale with scattered punctate   cortical infarcts. No herniation or hydrocephalus.   2.  Several punctate foci of susceptibility within the bilateral cerebral   hemispheres and within areas of infarct compatible with superimposed   microhemorrhage. No parenchymal hematoma.   3.  Development of additional acute-early subacute infarcts within the   bilateral cerebral hemispheres, cerebral peduncles, and brainstem.     MRA head:   1.  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   2. Multifocal intracranial arterial luminal stenoses, including the right   paraclinoid internal carotid artery, right middle cerebral, and bilateral   posterior cerebral arteries, without large vessel occlusion.      MRA neck:   1.  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 Mild multifocal luminal   irregularity the left cervical vertebral artery.   2.  Moderate stenosis of the mid left ICA with otherwise mild multifocal   irregularity of the bilateral CCAs and cervical ICAs.     Palliative Care Data  Patient Location at Time of Consultation: Hospital - ICU (includes MICU, SICU, TICU, CICU, Neuro ICU, PICU)  Primary Diagnosis: Neurology (includes Neuromuscular or non-dementia Neurodegenerative): Stroke - Ischemic/Embolic    High medical decision making due to the following:  1 or more chronic illness with severe exacerbation  Review of notes outside of my specialty, Review of each unique test, and Assessment requiring an independent historian and discussion of management or test interpretation with JP Cleatus APRN (physician(s) or other qualified health care professional outside of my specialty)

## 2023-12-03 NOTE — Progress Notes
 Weaning Readiness Screen Met (RT Only):: No, patient failed SAT and RASS is >1 or <-2

## 2023-12-03 NOTE — Progress Notes
 Neurology Consult Note  Name: Kevin Obrien   MRN: [id number]     DOB: 1950/07/17      Age: 73 y.o.  Admission Date: 12/01/2023     LOS: 2 days     Date of Service: 12/03/2023       Reason for Consult:      New bilateral subacute CVAs, recent pontomedullary CVA     Consult type: Co-Management w/Signed Orders    Assessment: Kevin Obrien is a 73 y.o.  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    Assessment & Plan  Acute respiratory failure (CMS-HCC)    Essential hypertension    Dyslipidemia    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)    Ischemic stroke (CMS-HCC)    Acute encephalopathy    On mechanically assisted ventilation (CMS-HCC)    Aspiration pneumonia (CMS-HCC)    NSTEMI (non-ST elevated myocardial infarction) (CMS-HCC)    Anemia    Acute kidney injury    Elevated liver enzymes    Sepsis, unspecified organism (CMS-HCC)    Elevated brain natriuretic peptide (BNP) level    Prolonged QT interval      Present on Admission:   Acute respiratory failure (CMS-HCC)   Ischemic stroke (CMS-HCC)   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)   Essential hypertension   Dyslipidemia    Neuroexam: GCS 3, intubated pinpoint pupils sluggishly reactive, gag reflex intact, corneal reflex absent.  No spontaneous movements or grimace to noxious stimulation.  Diffuse hyperreflexia with triple flexion bilaterally.    Neuroimaging:    CT Head 06/23: Redemonstration of multifocal evolving recent cerebral infarcts without gross hemorrhagic conversion. Posterior fossa and brainstem infarcts are seen to better advantage on MRI modality. Stable mild generalized cerebral volume loss and moderate nonspecific white matter disease, most likely due to chronic macrovascular ischemia, with several small superimposed chronic infarcts.     CT head 06/22: Development of large subacute appearing bilateral occipitotemporal and moderate sized right corona radiata/capsular/basal ganglia infarcts. No evidence of hemorrhagic conversion. No midline shift or herniation.  Stable chronic pontomedullary infarcts, left basal gallbladder-capsular infarct, and old lacunar infarcts, better demonstrated on prior MRI.       Impression: Patient with a recent pontomedullary stroke and extensive ICAD who presents with stepwise, slowly progressive encephalopathy over the last week in the context of acute blood loss anemia and SIRS/sepsis.  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    Recommendations  - Please repeat CT head on 06/24 and 06/25; if upcoming CT head does not show signs of cerebral edema, no need to continue daily head imaging   - keep head-of-bed elevated 30, keep head/neck midline to improve venous return via jugular veins, avoid C-collars or jugular central lines if possible  - Rest of care per ICU  - Continue Plavix  75 mg daily for now even though P2Y12 is normal (history of poor compliance)  - Family interested in talking with Palliative, please consult them      Thank you for allowing us  to participate in the care of your patient. Please contact Neurology team 1 on [facility] with  any questions or concerns.    Aidan Alemifar MS3      ATTESTATION    I personally performed or re-performed the history, physical exam and treatment plan for the E/M. I discussed the case with the Medical Student, and concur with the Medical Student documentation of history, physical exam and treatment plan unless otherwise noted.    Resident name:  Marge Arlington, MD Date:  12/03/2023   ______________________________________________________________________________________________  Subjective    Patient's status remained unchanged today. He remained unresponsive to stimuli with little spontaneous movement (GSC = 3). Patient's status was discussed with family at bedside. They were informed of the potential for large functional deficits post recovery even in optimistic scenario. The family were open to talking with the palliative care team to understand options and make an informed decision for future care.     Medical/Surgical/Social/Family History{  Past Medical History:    Accidental fall    Angina pectoris    Arthritis    BPH (benign prostatic hyperplasia)    Cataract    Constipation    Coronary artery disease    DM (diabetes mellitus) (CMS-HCC)    ED (erectile dysfunction)    Heart attack (CMS-HCC)    Hyperlipidemia    Hypertension    Joint pain    Neuropathy    Shingles     Surgical History:   Procedure Laterality Date    CORONARY STENT PLACEMENT  2016    HX CHOLECYSTECTOMY  09/2015    COLONOSCOPY N/A 04/20/2016    Performed by Rogers Kipper, MD at [facility] ENDO    ESOPHAGOGASTRODUODENOSCOPY N/A 04/20/2016    Performed by Rogers Kipper, MD at [facility] ENDO    ESOPHAGOGASTRODUODENOSCOPY BIOPSY  04/20/2016    Performed by Rogers Kipper, MD at [facility] END[facility]    UREA BREATH TEST N/A 11/13/2016    Performed by Ledora Catalina, MD at [facility] ENDO    ESOPHAGOGASTRODUODENOSCOPY WITH BIOPSY - FLEXIBLE N/A 11/22/2023    Performed by Bonnetta Camellia BROCKS, MD at [facility] ENDO    COLONOSCOPY WITH BIOPSY - FLEXIBLE N/A 11/22/2023    Performed by Bonnetta Camellia BROCKS, MD at [facility] ENDO     Social History     Tobacco Use    Smoking status: Former     Current packs/day: 0.00     Average packs/day: 2.0 packs/day for 10.0 years (20.0 ttl pk-yrs)     Types: Cigarettes     Start date: 04/11/1961     Quit date: 04/12/1971     Years since quitting: 52.6    Smokeless tobacco: Never    Tobacco comments:     Former smoker/quit years ago   Vaping Use    Vaping status: Never Used   Substance and Sexual Activity    Alcohol use: Not Currently    Drug use: Never    Sexual activity: Not Currently     Partners: Female     Birth control/protection: None           Family History   Problem Relation Name Age of Onset    Diabetes Mother AMPARO CUBIAS     Diabetes Father JOSE Nhem     Cancer-Lung Father JOSE Biscardi     Melanoma Neg Hx      Cataract Neg Hx      Glaucoma Neg Hx      Macular Degen Neg Hx       Allergies: Seasonal allergies  Current Medications    Medication Directions   acetaminophen  (TYLENOL )  325 mg tablet Take two tablets via feeding tube every 6 hours as needed for Pain. Indications: pain  Patient taking differently: two tablets by Per G Tube route every 6 hours as needed for Pain. Indications: pain   amLODIPine  (NORVASC ) 5 mg tablet one tablet by Per NG tube route daily. Indications: high blood pressure  Patient taking differently: one tablet by Per G Tube route daily. Indications: high blood pressure   amoxicillin /K clavulanate (AUGMENTIN ) 400 mg/5 mL oral suspension 10.9 mL by Per NG tube route twice daily for 7 days. Take with food.  Indications: a lower respiratory infection  Patient taking differently: 875 mg by Per G Tube route twice daily. Take with food.  Indications: a lower respiratory infection   artificial tears (PF) single dose ophthalmic solution Apply two drops to both eyes three times daily. Indications: dry eye   aspirin  81 mg chewable tablet one tablet by Per NG tube route daily. Indications: stroke prevention  Patient taking differently: one tablet by Per G Tube route daily. Indications: stroke prevention   clopiDOGreL  (PLAVIX ) 75 mg tablet one tablet by Per NG tube route daily. Indications: prevention for a blood clot going to the brain  Patient taking differently: one tablet by Per G Tube route daily. Indications: prevention for a blood clot going to the brain   dextrose  50% (D50) syringe Administer 25-50 mL through vein as Needed (=< 70 mg/dL: See admin instructions). Indications: hypoglycemia   diclofenac  sodium (VOLTAREN ) 1 % topical gel Apply two g topically to affected area four times daily. Indications: pain   doxazosin  (CARDURA ) 4 mg tablet Take one tablet via feeding tube at bedtime daily. Indications: enlarged prostate with urination problem  Patient taking differently: one tablet by Per G Tube route at bedtime daily. Indications: enlarged prostate with urination problem   insulin  aspart (U-100) (NOVOLOG  FLEXPEN U-100 INSULIN ) 100 unit/mL (3 mL) PEN Inject twelve Units under the skin four times daily. PLUS Correction Factor if: BS 181-220=2 units, BS 221-260=4 units, BS 261-300=6 units, BS 301-350=8 units, BS 351-400=10 units, BS >400=12 units  Indications: type 2 diabetes mellitus   insulin  glargine (LANTUS  SOLOSTAR U-100 INSULIN ) 100 unit/mL (3 mL) subcutaneous PEN Inject twenty Units under the skin daily. Indications: type 2 diabetes mellitus  Patient taking differently: Inject twenty Units under the skin at bedtime daily. Indications: type 2 diabetes mellitus   Lactose-Free Food with Fiber (JEVITY 1.5 CAL) 0.06 gram-1.5 kcal/mL liqd Take 445 mL by mouth four times daily.   lidocaine  (LIDODERM ) 5 % topical patch Apply one patch topically to affected area daily. Apply patch for 12 hours, then remove for 12 hours before repeating.  Indications: neuropathic pain   lidocaine  hcl (XYLOCAINE ) 2 % mucosal jelly Apply  topically to affected area as Needed. Indications: local anesthesia for urethral pain  Patient taking differently: Apply  topically to affected area every 24 hours as needed. Indications: local anesthesia for urethral pain   melatonin 5 mg tablet Take one tablet via feeding tube at bedtime daily. Indications: difficulty sleeping  Patient taking differently: one tablet by Per G Tube route at bedtime daily. Indications: difficulty sleeping   mirtazapine  (REMERON ) 15 mg tablet Take one tablet via feeding tube at bedtime daily. Indications: major depressive disorder  Patient taking differently: one tablet by Per G Tube route at bedtime daily. Indications: major depressive disorder   modafiniL  (PROVIGIL ) 100 mg tablet Take two tablets via feeding tube daily AND one tablet daily. Indications: post-stroke sleepiness   petrolatum  (  STYE) 57.7-31.9 % oint ophthalmic ointment Apply one-quarter inch to both eyes three times daily. Indications: dry eye   phenoL (CHLORASEPTIC) 1.4 % mouth spray Take two sprays by mouth or throat as directed as Needed. Indications: irritation of the mouth  Patient taking differently: Take two sprays by mouth or throat as directed every 4 hours as needed. Indications: irritation of the mouth   propranoloL  (INDERAL ) 10 mg tablet one tablet by Per NG tube route twice daily. Indications: high blood pressure  Patient taking differently: one tablet by Per G Tube route twice daily. Indications: high blood pressure   rosuvastatin  (CRESTOR ) 40 mg tablet Take one tablet via feeding tube daily. Indications: excessive fat in the blood  Patient taking differently: one tablet by Per G Tube route at bedtime daily. Indications: excessive fat in the blood   sennosides-docusate sodium  (SENOKOT-S) 8.6/50 mg tablet one tablet by Per G Tube route daily as needed (Constipation - First Line). Indications: constipation   thiamine  mononitrate (vit B1) 100 mg tablet one tablet by Per NG tube route daily. Indications: deficiency in thiamine  or vitamin B1  Patient taking differently: one tablet by Per G Tube route daily. Indications: deficiency in thiamine  or vitamin B1   trazodone (#) (DESYREL ) 10 mg/mL Take 1.25ml (12.5 mg) twice daily as needed and 2.5ml (25 mg) nightly at bed time  Indications: PRN for agitation       Objective                        Vital Signs: Last Filed                 Vital Signs: 24 Hour Range   BP: 121/54 (06/23 1815)  Temp: 37.9 ?C (100.2 ?F) (06/24 1200)  Pulse: 95 (06/24 1222)  Respirations: 26 PER MINUTE (06/24 1222)  SpO2: 99 % (06/24 1222)  O2%: 30 % (06/24 1222)  O2 Device: Ventilator (06/24 1222) BP: (121)/(54)   ABP: (108-164)/(34-49)   Temp:  [37.1 ?C (98.8 ?F)-38.1 ?C (100.6 ?F)]   Pulse:  [78-100]   Respirations:  [20 PER MINUTE-30 PER MINUTE]   SpO2:  [97 %-99 %]   O2%:  [30 %]   O2 Device: Ventilator   CPOT Score Total: 0 (12/03/23 1200) Vitals:    12/01/23 2022 12/02/23 0314 12/02/23 0500   Weight: 64.7 kg (142 lb 10.2 oz) 65.8 kg (145 lb) 65.8 kg (145 lb 1 oz)         Intake/Output Summary:  (Last 24 hours)    Intake/Output Summary (Last 24 hours) at 12/03/2023 1246  Last data filed at 12/03/2023 1200  Gross per 24 hour   Intake 1923.34 ml   Output 783 ml   Net 1140.34 ml     Stool Occurrence: 1        Physical Exam  General physical exam:  HEENT: normocephalic  Chest: Intubated      Physical Exam:    Blood pressure 121/54, pulse 95, temperature 37.9 ?C (100.2 ?F), height 172.7 cm (5' 8), weight 65.8 kg (145 lb 1 oz), SpO2 99%.      Neuro:   Mental Status: GCS 3, not moving to noxious stimuli   Cranial Nerves:       - Pupil exam: Size: Pinpoint, sluggishly reactive    - EOM: No doll's eye                 - Corneal reflex: Absent bilaterally    -  Grimace/facial movement: absent     - Gag reflex: Present    Motor:         RUE: Strength: 0/5                     RLE: Strength: 0/5            LUE: Strength: 0/5         LLE: Strength: 0/5   Sensory: No grimacing/withdrawal to noxious stimulation   Coordination: NA   DTRs: +3 all over, triple flexion bilaterally   Gait: NA      Lab/Radiology/Other Diagnostic Tests:  Results for orders placed or performed during the hospital encounter of 12/01/23 (from the past 24 hours)   POC GLUCOSE    Collection Time: 12/02/23 12:56 PM   Result Value Ref Range    Glucose, POC 202 (H) 70 - 100 mg/dL   LACTIC ACID (BG - RAPID LACTATE)    Collection Time: 12/02/23  1:53 PM   Result Value Ref Range    Lactic Acid,BG 2.8 (H) 0.5 - 2.0 mmol/L   POC GLUCOSE    Collection Time: 12/02/23  1:54 PM   Result Value Ref Range    Glucose, POC 184 (H) 70 - 100 mg/dL   POC GLUCOSE    Collection Time: 12/02/23  2:49 PM   Result Value Ref Range    Glucose, POC 173 (H) 70 - 100 mg/dL   POC GLUCOSE    Collection Time: 12/02/23  3:47 PM   Result Value Ref Range    Glucose, POC 162 (H) 70 - 100 mg/dL   BASIC METABOLIC PANEL    Collection Time: 12/02/23  3:48 PM   Result Value Ref Range    Sodium 134 (L) 137 - 147 mmol/L    Potassium 3.5 3.5 - 5.1 mmol/L    Chloride 100 98 - 110 mmol/L    Glucose 163 (H) 70 - 100 mg/dL    Blood Urea Nitrogen 76 (H) 7 - 25 mg/dL    Creatinine 4.95 (H) 0.40 - 1.24 mg/dL    Calcium 7.3 (L) 8.5 - 10.6 mg/dL    CO2 18 (L) 21 - 30 mmol/L    Anion Gap 16 (H) 3 - 12    Glomerular Filtration Rate (GFR) 11 (L) >60 mL/min   CREATINE KINASE-CPK    Collection Time: 12/02/23  3:48 PM   Result Value Ref Range    Creatine Kinase 1,784 (H) 35 - 232 U/L   HIGH SENSITIVITY TROPONIN I, RANDOM    Collection Time: 12/02/23  3:48 PM   Result Value Ref Range    hs Troponin I, Random 2,262 (H) <20 ng/L   POC GLUCOSE    Collection Time: 12/02/23  4:54 PM   Result Value Ref Range    Glucose, POC 147 (H) 70 - 100 mg/dL   POC GLUCOSE    Collection Time: 12/02/23  5:55 PM   Result Value Ref Range    Glucose, POC 136 (H) 70 - 100 mg/dL   BASIC METABOLIC PANEL    Collection Time: 12/02/23  8:00 PM   Result Value Ref Range    Sodium 134 (L) 137 - 147 mmol/L    Potassium 3.4 (L) 3.5 - 5.1 mmol/L    Chloride 100 98 - 110 mmol/L    Glucose 140 (H) 70 - 100 mg/dL    Blood Urea Nitrogen 77 (H) 7 - 25 mg/dL    Creatinine 4.65 (H) 0.40 -  1.24 mg/dL    Calcium 7.9 (L) 8.5 - 10.6 mg/dL    CO2 17 (L) 21 - 30 mmol/L    Anion Gap 17 (H) 3 - 12    Glomerular Filtration Rate (GFR) 11 (L) >60 mL/min   HIGH SENSITIVITY TROPONIN I, RANDOM    Collection Time: 12/02/23  8:00 PM   Result Value Ref Range    hs Troponin I, Random 3,104 (H) <20 ng/L   POC GLUCOSE    Collection Time: 12/02/23  8:02 PM   Result Value Ref Range Glucose, POC 129 (H) 70 - 100 mg/dL   POC GLUCOSE    Collection Time: 12/02/23  9:03 PM   Result Value Ref Range    Glucose, POC 113 (H) 70 - 100 mg/dL   POC GLUCOSE    Collection Time: 12/02/23 11:03 PM   Result Value Ref Range    Glucose, POC 153 (H) 70 - 100 mg/dL   POC GLUCOSE    Collection Time: 12/03/23 12:56 AM   Result Value Ref Range    Glucose, POC 159 (H) 70 - 100 mg/dL   POC GLUCOSE    Collection Time: 12/03/23  2:53 AM   Result Value Ref Range    Glucose, POC 175 (H) 70 - 100 mg/dL   CBC AND DIFF    Collection Time: 12/03/23  3:55 AM   Result Value Ref Range    White Blood Cells 12.10 (H) 4.50 - 11.00 10*3/uL    Red Blood Cells 2.94 (L) 4.40 - 5.50 10*6/uL    Hemoglobin 9.0 (L) 13.5 - 16.5 g/dL    Hematocrit 75.2 (L) 40.0 - 50.0 %    MCV 83.8 80.0 - 100.0 fL    MCH 30.6 26.0 - 34.0 pg    MCHC 36.4 (H) 32.0 - 36.0 g/dL    RDW 86.5 88.9 - 84.9 %    Platelet Count 195 150 - 400 10*3/uL    MPV 10.0 7.0 - 11.0 fL    Neutrophils 85.1 (H) 41.0 - 77.0 %    Lymphocytes 7.9 (L) 24.0 - 44.0 %    Monocytes 6.8 4.0 - 12.0 %    Eosinophils 0.1 0.0 - 5.0 %    Basophils 0.1 0.0 - 2.0 %    Absolute Neutrophil Count 10.30 (H) 1.80 - 7.00 10*3/uL    Absolute Lymph Count 1.00 1.00 - 4.80 10*3/uL    Absolute Monocyte Count 0.80 0.00 - 0.80 10*3/uL    Absolute Eosinophil Count 0.00 0.00 - 0.45 10*3/uL    Absolute Basophil Count 0.00 0.00 - 0.20 10*3/uL    MDW (Monocyte Distribution Width) 24.8 (H) <=20.6   COMPREHENSIVE METABOLIC PANEL    Collection Time: 12/03/23  3:55 AM   Result Value Ref Range    Sodium 134 (L) 137 - 147 mmol/L    Potassium 3.3 (L) 3.5 - 5.1 mmol/L    Chloride 100 98 - 110 mmol/L    Glucose 170 (H) 70 - 100 mg/dL    Blood Urea Nitrogen 77 (H) 7 - 25 mg/dL    Creatinine 4.46 (H) 0.40 - 1.24 mg/dL    Calcium 7.6 (L) 8.5 - 10.6 mg/dL    Total Protein 5.4 (L) 6.0 - 8.0 g/dL    Total Bilirubin 0.4 0.2 - 1.3 mg/dL    Albumin 2.6 (L) 3.5 - 5.0 g/dL    Alk Phosphatase 82 25 - 110 U/L    AST 70 (H) 7 - 40 U/L ALT 92 (H) 7 -  56 U/L    CO2 17 (L) 21 - 30 mmol/L    Anion Gap 17 (H) 3 - 12    Glomerular Filtration Rate (GFR) 10 (L) >60 mL/min   MAGNESIUM   CELLULAR THERAPEUTICS    Collection Time: 12/03/23  3:55 AM   Result Value Ref Range    Magnesium  1.9 1.6 - 2.6 mg/dL   PHOSPHORUS  CELLULAR THERAPEUTICS    Collection Time: 12/03/23  3:55 AM   Result Value Ref Range    Phosphorus 3.5 2.0 - 4.5 mg/dL   BLOOD GASES, ARTERIAL    Collection Time: 12/03/23  3:55 AM   Result Value Ref Range    pH 7.44 7.35 - 7.45    pCO2-Arterial 24 (L) 35 - 45 mmHg    pO2-Arterial 126 (H) 80 - 100 mmHg    Base Deficit-Arterial 6.4 mmol/L    O2 SAT-Arterial 99.5 (H) 95.0 - 99.0 %    Bicarbonate-ART-Cal 19.1 (L) 21.0 - 28.0 mmol/L    FiO2 Value Arterial     HIGH SENSITIVITY TROPONIN I, RANDOM    Collection Time: 12/03/23  3:55 AM   Result Value Ref Range    hs Troponin I, Random 2,216 (H) <20 ng/L   CREATINE KINASE-CPK    Collection Time: 12/03/23  3:55 AM   Result Value Ref Range    Creatine Kinase 971 (H) 35 - 232 U/L   POC GLUCOSE    Collection Time: 12/03/23  3:59 AM   Result Value Ref Range    Glucose, POC 167 (H) 70 - 100 mg/dL   POC GLUCOSE    Collection Time: 12/03/23  5:16 AM   Result Value Ref Range    Glucose, POC 163 (H) 70 - 100 mg/dL   POC GLUCOSE    Collection Time: 12/03/23  6:06 AM   Result Value Ref Range    Glucose, POC 157 (H) 70 - 100 mg/dL   POC GLUCOSE    Collection Time: 12/03/23  8:00 AM   Result Value Ref Range    Glucose, POC 190 (H) 70 - 100 mg/dL   CULTURE - RESPIRATORY    Collection Time: 12/03/23  8:18 AM    Specimen: Trachea; Sputum   Result Value Ref Range    Gram Stain Less than 10/LPF Neutrophils     Gram Stain Less than 10/LPF Squamous Epithelial Cells     Gram Stain No Organisms seen    POC GLUCOSE    Collection Time: 12/03/23  9:18 AM   Result Value Ref Range    Glucose, POC 205 (H) 70 - 100 mg/dL   POC GLUCOSE    Collection Time: 12/03/23 10:21 AM   Result Value Ref Range    Glucose, POC 218 (H) 70 - 100 mg/dL   POC GLUCOSE    Collection Time: 12/03/23 11:06 AM   Result Value Ref Range    Glucose, POC 226 (H) 70 - 100 mg/dL   POC GLUCOSE    Collection Time: 12/03/23 12:14 PM   Result Value Ref Range    Glucose, POC 213 (H) 70 - 100 mg/dL           I personally reviewed the following MRI, CT   24-hour labs:    Results for orders placed or performed during the hospital encounter of 12/01/23 (from the past 24 hours)   POC GLUCOSE    Collection Time: 12/02/23 12:56 PM   Result Value Ref Range    Glucose, POC 202 (H)  70 - 100 mg/dL   LACTIC ACID (BG - RAPID LACTATE)    Collection Time: 12/02/23  1:53 PM   Result Value Ref Range    Lactic Acid,BG 2.8 (H) 0.5 - 2.0 mmol/L   POC GLUCOSE    Collection Time: 12/02/23  1:54 PM   Result Value Ref Range    Glucose, POC 184 (H) 70 - 100 mg/dL   POC GLUCOSE    Collection Time: 12/02/23  2:49 PM   Result Value Ref Range    Glucose, POC 173 (H) 70 - 100 mg/dL   POC GLUCOSE    Collection Time: 12/02/23  3:47 PM   Result Value Ref Range    Glucose, POC 162 (H) 70 - 100 mg/dL   BASIC METABOLIC PANEL    Collection Time: 12/02/23  3:48 PM   Result Value Ref Range    Sodium 134 (L) 137 - 147 mmol/L    Potassium 3.5 3.5 - 5.1 mmol/L    Chloride 100 98 - 110 mmol/L    Glucose 163 (H) 70 - 100 mg/dL    Blood Urea Nitrogen 76 (H) 7 - 25 mg/dL    Creatinine 4.95 (H) 0.40 - 1.24 mg/dL    Calcium 7.3 (L) 8.5 - 10.6 mg/dL    CO2 18 (L) 21 - 30 mmol/L    Anion Gap 16 (H) 3 - 12    Glomerular Filtration Rate (GFR) 11 (L) >60 mL/min   CREATINE KINASE-CPK    Collection Time: 12/02/23  3:48 PM   Result Value Ref Range    Creatine Kinase 1,784 (H) 35 - 232 U/L   HIGH SENSITIVITY TROPONIN I, RANDOM    Collection Time: 12/02/23  3:48 PM   Result Value Ref Range    hs Troponin I, Random 2,262 (H) <20 ng/L   POC GLUCOSE    Collection Time: 12/02/23  4:54 PM   Result Value Ref Range    Glucose, POC 147 (H) 70 - 100 mg/dL   POC GLUCOSE    Collection Time: 12/02/23  5:55 PM   Result Value Ref Range Glucose, POC 136 (H) 70 - 100 mg/dL   BASIC METABOLIC PANEL    Collection Time: 12/02/23  8:00 PM   Result Value Ref Range    Sodium 134 (L) 137 - 147 mmol/L    Potassium 3.4 (L) 3.5 - 5.1 mmol/L    Chloride 100 98 - 110 mmol/L    Glucose 140 (H) 70 - 100 mg/dL    Blood Urea Nitrogen 77 (H) 7 - 25 mg/dL    Creatinine 4.65 (H) 0.40 - 1.24 mg/dL    Calcium 7.9 (L) 8.5 - 10.6 mg/dL    CO2 17 (L) 21 - 30 mmol/L    Anion Gap 17 (H) 3 - 12    Glomerular Filtration Rate (GFR) 11 (L) >60 mL/min   HIGH SENSITIVITY TROPONIN I, RANDOM    Collection Time: 12/02/23  8:00 PM   Result Value Ref Range    hs Troponin I, Random 3,104 (H) <20 ng/L   POC GLUCOSE    Collection Time: 12/02/23  8:02 PM   Result Value Ref Range    Glucose, POC 129 (H) 70 - 100 mg/dL   POC GLUCOSE    Collection Time: 12/02/23  9:03 PM   Result Value Ref Range    Glucose, POC 113 (H) 70 - 100 mg/dL   POC GLUCOSE    Collection Time: 12/02/23 11:03 PM   Result Value  Ref Range    Glucose, POC 153 (H) 70 - 100 mg/dL   POC GLUCOSE    Collection Time: 12/03/23 12:56 AM   Result Value Ref Range    Glucose, POC 159 (H) 70 - 100 mg/dL   POC GLUCOSE    Collection Time: 12/03/23  2:53 AM   Result Value Ref Range    Glucose, POC 175 (H) 70 - 100 mg/dL   CBC AND DIFF    Collection Time: 12/03/23  3:55 AM   Result Value Ref Range    White Blood Cells 12.10 (H) 4.50 - 11.00 10*3/uL    Red Blood Cells 2.94 (L) 4.40 - 5.50 10*6/uL    Hemoglobin 9.0 (L) 13.5 - 16.5 g/dL    Hematocrit 75.2 (L) 40.0 - 50.0 %    MCV 83.8 80.0 - 100.0 fL    MCH 30.6 26.0 - 34.0 pg    MCHC 36.4 (H) 32.0 - 36.0 g/dL    RDW 86.5 88.9 - 84.9 %    Platelet Count 195 150 - 400 10*3/uL    MPV 10.0 7.0 - 11.0 fL    Neutrophils 85.1 (H) 41.0 - 77.0 %    Lymphocytes 7.9 (L) 24.0 - 44.0 %    Monocytes 6.8 4.0 - 12.0 %    Eosinophils 0.1 0.0 - 5.0 %    Basophils 0.1 0.0 - 2.0 %    Absolute Neutrophil Count 10.30 (H) 1.80 - 7.00 10*3/uL    Absolute Lymph Count 1.00 1.00 - 4.80 10*3/uL    Absolute Monocyte Count 0.80 0.00 - 0.80 10*3/uL    Absolute Eosinophil Count 0.00 0.00 - 0.45 10*3/uL    Absolute Basophil Count 0.00 0.00 - 0.20 10*3/uL    MDW (Monocyte Distribution Width) 24.8 (H) <=20.6   COMPREHENSIVE METABOLIC PANEL    Collection Time: 12/03/23  3:55 AM   Result Value Ref Range    Sodium 134 (L) 137 - 147 mmol/L    Potassium 3.3 (L) 3.5 - 5.1 mmol/L    Chloride 100 98 - 110 mmol/L    Glucose 170 (H) 70 - 100 mg/dL    Blood Urea Nitrogen 77 (H) 7 - 25 mg/dL    Creatinine 4.46 (H) 0.40 - 1.24 mg/dL    Calcium 7.6 (L) 8.5 - 10.6 mg/dL    Total Protein 5.4 (L) 6.0 - 8.0 g/dL    Total Bilirubin 0.4 0.2 - 1.3 mg/dL    Albumin 2.6 (L) 3.5 - 5.0 g/dL    Alk Phosphatase 82 25 - 110 U/L    AST 70 (H) 7 - 40 U/L    ALT 92 (H) 7 - 56 U/L    CO2 17 (L) 21 - 30 mmol/L    Anion Gap 17 (H) 3 - 12    Glomerular Filtration Rate (GFR) 10 (L) >60 mL/min   MAGNESIUM   CELLULAR THERAPEUTICS    Collection Time: 12/03/23  3:55 AM   Result Value Ref Range    Magnesium  1.9 1.6 - 2.6 mg/dL   PHOSPHORUS  CELLULAR THERAPEUTICS    Collection Time: 12/03/23  3:55 AM   Result Value Ref Range    Phosphorus 3.5 2.0 - 4.5 mg/dL   BLOOD GASES, ARTERIAL    Collection Time: 12/03/23  3:55 AM   Result Value Ref Range    pH 7.44 7.35 - 7.45    pCO2-Arterial 24 (L) 35 - 45 mmHg    pO2-Arterial 126 (H) 80 - 100 mmHg    Base  Deficit-Arterial 6.4 mmol/L    O2 SAT-Arterial 99.5 (H) 95.0 - 99.0 %    Bicarbonate-ART-Cal 19.1 (L) 21.0 - 28.0 mmol/L    FiO2 Value Arterial     HIGH SENSITIVITY TROPONIN I, RANDOM    Collection Time: 12/03/23  3:55 AM   Result Value Ref Range    hs Troponin I, Random 2,216 (H) <20 ng/L   CREATINE KINASE-CPK    Collection Time: 12/03/23  3:55 AM   Result Value Ref Range    Creatine Kinase 971 (H) 35 - 232 U/L   POC GLUCOSE    Collection Time: 12/03/23  3:59 AM   Result Value Ref Range    Glucose, POC 167 (H) 70 - 100 mg/dL   POC GLUCOSE    Collection Time: 12/03/23  5:16 AM   Result Value Ref Range    Glucose, POC 163 (H) 70 - 100 mg/dL   POC GLUCOSE    Collection Time: 12/03/23  6:06 AM   Result Value Ref Range    Glucose, POC 157 (H) 70 - 100 mg/dL   POC GLUCOSE    Collection Time: 12/03/23  8:00 AM   Result Value Ref Range    Glucose, POC 190 (H) 70 - 100 mg/dL   CULTURE - RESPIRATORY    Collection Time: 12/03/23  8:18 AM    Specimen: Trachea; Sputum   Result Value Ref Range    Gram Stain Less than 10/LPF Neutrophils     Gram Stain Less than 10/LPF Squamous Epithelial Cells     Gram Stain No Organisms seen    POC GLUCOSE    Collection Time: 12/03/23  9:18 AM   Result Value Ref Range    Glucose, POC 205 (H) 70 - 100 mg/dL   POC GLUCOSE    Collection Time: 12/03/23 10:21 AM   Result Value Ref Range    Glucose, POC 218 (H) 70 - 100 mg/dL   POC GLUCOSE    Collection Time: 12/03/23 11:06 AM   Result Value Ref Range    Glucose, POC 226 (H) 70 - 100 mg/dL   POC GLUCOSE    Collection Time: 12/03/23 12:14 PM   Result Value Ref Range    Glucose, POC 213 (H) 70 - 100 mg/dL

## 2023-12-04 ENCOUNTER — Inpatient Hospital Stay: Admit: 2023-12-04 | Discharge: 2023-12-04 | Payer: MEDICARE

## 2023-12-04 ENCOUNTER — Encounter: Admit: 2023-12-04 | Discharge: 2023-12-04 | Payer: MEDICARE

## 2023-12-04 LAB — POC GLUCOSE
~~LOC~~ BKR POC GLUCOSE: 102 mg/dL — ABNORMAL HIGH (ref 70–100)
~~LOC~~ BKR POC GLUCOSE: 102 mg/dL — ABNORMAL HIGH (ref 70–100)
~~LOC~~ BKR POC GLUCOSE: 105 mg/dL — ABNORMAL HIGH (ref 70–100)
~~LOC~~ BKR POC GLUCOSE: 105 mg/dL — ABNORMAL HIGH (ref 70–100)
~~LOC~~ BKR POC GLUCOSE: 106 mg/dL — ABNORMAL HIGH (ref 70–100)
~~LOC~~ BKR POC GLUCOSE: 106 mg/dL — ABNORMAL HIGH (ref 70–100)
~~LOC~~ BKR POC GLUCOSE: 115 mg/dL — ABNORMAL HIGH (ref 70–100)
~~LOC~~ BKR POC GLUCOSE: 115 mg/dL — ABNORMAL HIGH (ref 70–100)
~~LOC~~ BKR POC GLUCOSE: 133 mg/dL — ABNORMAL HIGH (ref 70–100)
~~LOC~~ BKR POC GLUCOSE: 142 mg/dL — ABNORMAL HIGH (ref 70–100)
~~LOC~~ BKR POC GLUCOSE: 151 mg/dL — ABNORMAL HIGH (ref 70–100)
~~LOC~~ BKR POC GLUCOSE: 163 mg/dL — ABNORMAL HIGH (ref 70–100)
~~LOC~~ BKR POC GLUCOSE: 165 mg/dL — ABNORMAL HIGH (ref 70–100)
~~LOC~~ BKR POC GLUCOSE: 166 mg/dL — ABNORMAL HIGH (ref 70–100)
~~LOC~~ BKR POC GLUCOSE: 167 mg/dL — ABNORMAL HIGH (ref 70–100)
~~LOC~~ BKR POC GLUCOSE: 169 mg/dL — ABNORMAL HIGH (ref 70–100)
~~LOC~~ BKR POC GLUCOSE: 178 mg/dL — ABNORMAL HIGH (ref 70–100)
~~LOC~~ BKR POC GLUCOSE: 85 mg/dL (ref 70–100)
~~LOC~~ BKR POC GLUCOSE: 91 mg/dL (ref 70–100)
~~LOC~~ BKR POC GLUCOSE: 93 mg/dL (ref 70–100)
~~LOC~~ BKR POC GLUCOSE: 94 mg/dL (ref 70–100)
~~LOC~~ BKR POC GLUCOSE: 99 mg/dL (ref 70–100)

## 2023-12-04 NOTE — Progress Notes
 OCCUPATIONAL THERAPY  NOTE   Name: Kevin Obrien   MRN: [id number]     DOB: 1950-10-06      Age: 73 y.o.  Admission Date: 12/01/2023     LOS: 1 day     Date of Service: 12/02/2023      Based on chart review and discussion with bedside RN, pt remains medically inappropriate for skilled OT/PT intervention at this time. Pt intubated and sedated and unable to appropriately participate in functional mobility. OT/PT will continue to follow and provide intervention as pt able/appropriate.     Therapist: Lauraine Blas, OTR/L  Date: 12/04/2023

## 2023-12-04 NOTE — Consults
 PALLIATIVE CARE INPATIENT NOTE     Name: Kevin Obrien            MRN: [id number]                DOB: May 18, 1951          Age: 73 y.o.  Admission Date: 12/01/2023             LOS: 3 days    ASSESSMENT/PLAN     Neilson Oehlert Franko is a 73 y.o. 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 Additionally he was found to have aspiration pna, AKI and septic shock. Palliative medicine is consulted to assist with goals of care.     #Acute/subacute R occipitotemporal and L temporoparietal occipital stroke  #Recent R pontomedullary stroke  #Recent GI bleed  #AKI  #Septic shock  #Aspiration pna  #Requiring mechanical ventilation for airway protection  #Goals of care    Discussion:    Palliative team (myself, Pam APRN) met with patient and family at bedside. Pt's daughter [name] and granddaughter [name] are at bedside. They heard he is stable on rounds today. They have been having discussions as a family to determine how to proceed with Daevion's care. They feel well informed of options aggressive care vs comfort care and have no additional questions. They are taking time to process and discuss as a family. Patient has 4 daughters [name] Bail, Lesli, and Kyra who have been involved in discussions about his care.     RECOMMENDATIONS:   DNR-FI - continue supportive care as family considers aggressive vs comfort path  Family able to state pt lives a very active life and did not like being dependent on others  We will continue to follow for ongoing goals conversations and support    PALLIATIVE CARE PLANNING     Advance Care Planning:   Identified Health Care Decision Maker:  [name] Bail Lesli are listed as emergency contact  DPOA - Would benefit from completion of DPOA - unable to obtain  TPOPP - Not discussed    PC Clinic - NA    Medication safety - NA    Disposition planning: TBD       SUBJECTIVE     CC/Reason for Visit:goals of care    Additional history from: ICU APRN, chart review, bedside RN, family    Interval Hx: pt is unresponsive in ICU on mechanical ventilation. On pressor. No overnight events charted.     OBJECTIVE     Blood pressure 125/51, pulse 92, temperature 37.8 ?C (100 ?F), height 172.7 cm (5' 8), weight 65.8 kg (145 lb 1 oz), SpO2 98%.  Physical Exam  General: critically ill, unresponsive  HEENT: Eyes closed, ETT in place  CV: Regular rate  Pulm: Non-labored respirations, mechanically ventilated  Neuro: unresponsive, not on sedation      Lab Results:  CBC   Lab Results   Component Value Date/Time    WBC 9.60 12/04/2023 03:01 AM    HGB 7.8 (L) 12/04/2023 03:01 AM    PLTCT 185 12/04/2023 03:01 AM     Lab Results   Component Value Date/Time    NEUT 80.2 (H) 12/04/2023 03:01 AM    ANC 7.70 (H) 12/04/2023 03:01 AM      Chemistries   Lab Results   Component Value Date/Time    NA 138 12/04/2023 03:01  AM    K 3.4 (L) 12/04/2023 03:01 AM    BUN 83 (H) 12/04/2023 03:01 AM    CR 6.22 (H) 12/04/2023 03:01 AM    GLU 135 (H) 12/04/2023 03:01 AM     Lab Results   Component Value Date/Time    CA 7.9 (L) 12/04/2023 03:01 AM    PO4 3.2 12/04/2023 03:01 AM    ALBUMIN 2.3 (L) 12/04/2023 03:01 AM    TOTPROT 5.4 (L) 12/04/2023 03:01 AM    ALKPHOS 85 12/04/2023 03:01 AM    AST 61 (H) 12/04/2023 03:01 AM    ALT 90 (H) 12/04/2023 03:01 AM    TOTBILI 0.4 12/04/2023 03:01 AM    GFR 9 (L) 12/04/2023 03:01 AM    GFRAA >60 02/22/2019 06:28 PM        Other Pertinent Diagnostic Results:   CT Head 06/25  1.  Redemonstration of multifocal evolving recent cerebral infarcts   without gross hemorrhagic conversion, midline shift, or herniation. The   smaller cerebral, brainstem, and cerebellar infarcts are better   demonstrated on comparison MRI.   2.  Stable mild generalized cerebral volume loss and moderate nonspecific   white matter disease, likely due to chronic macrovascular ischemia, with several small superimposed chronic infarcts.     Palliative Care Data  Patient Location at Time of Consultation: Hospital - ICU (includes MICU, SICU, TICU, CICU, Neuro ICU, PICU)  Primary Diagnosis: Neurology (includes Neuromuscular or non-dementia Neurodegenerative): Stroke - Ischemic/Embolic    Moderate medical decision making due to the following:  1 or more chronic illnesses with exacerbation, progression, or side effects of treatment  Review of notes outside of my specialty, Review of each unique test, and Assessment requiring an independent historian and discussion of management or test interpretation with JP Cleatus APRN (physician(s) or other qualified health care professional outside of my specialty)

## 2023-12-04 NOTE — Consults
 Chaplain on call COC with primary nurse Reche and phoned on-call priest at [facility] to request anointing of the sick per family request. Kevin Obrien states I'll be up this afternoon.      Chaplain Chyrl Delaine, MDIV  Desk: [phone]     The On-Call [facility] is available on Voalte or can be paged via the switchboard [phone]) for urgent and emergent needs.   The Spiritual Care team responds to other requests within 24-hours when submitted as a Chaplain Consult in O2.

## 2023-12-04 NOTE — Progress Notes
 Critical Care   Progress Note     Kevin Obrien  Today's Date:  12/04/2023  Admission Date: 12/01/2023  LOS: 3 days    Principal Problem:    Acute respiratory failure (CMS-HCC)  Active Problems:    Essential hypertension    Dyslipidemia    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)    Ischemic stroke (CMS-HCC)    Acute encephalopathy    On mechanically assisted ventilation (CMS-HCC)    Aspiration pneumonia (CMS-HCC)    NSTEMI (non-ST elevated myocardial infarction) (CMS-HCC)    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 (CMS-HCC)    Anemia    History of gastrointestinal diverticular hemorrhage    Acute kidney injury    Elevated liver enzymes    Coronary artery disease    History of coronary artery stent placement    Diarrhea    Elevated creatine kinase level    Lactic acidosis    Sepsis, unspecified organism (CMS-HCC)    Elevated brain natriuretic peptide (BNP) level    Prolonged QT interval        Assessment/Plan:      Hospital Course: Kevin Obrien is a 73 y.o. 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 Several recent admissions since May 2025; most recently admitted 6/12 - 6/20 for GIB; dc'd to SNF. Presented to ED for AMS. GCS 3 so intubated for airway protection. Initial labs significant for elevated troponin, AKI, & hyperglycemia. CT head revealed new large bilateral subacute occipitotemporal & moderate R basal ganglia infarcts. CT chest c/f pneumonia. Given IVF & abx. Admit to MICU.     Neurology following & assisting w/ stroke management; plan for serial imaging. Neuro exam poor off sedation.  Troponin now downtrending but does have e/o new WMA & acutely reduced EF on echo; managing medically for now. Infectious work-up in process; remains on abx.      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  Cerebral Atherosclerosis  - Likely multifactorial d/t new strokes, metabolic derangements in the setting of AKI, & infection  - Suspect new strokes likely d/t ICAD w/ cerebral hypoperfusion etiology  - PTA Plavix  75mg  daily, ASA 81mg  daily, melatonin 5mg , Remeron  15mg  daily HS, Modafinil  200mg  daily AM then 100mg   - Admit May 2025 w/ acute R pontomedullary brainstem infarct; planned for 3 months DAPT followed by aspirin  monotherapy  - Baseline non-verbal post CVA 5/9 w/ L side deficits (decreased ROM/attempting fine motor), assisted ambulation; more recently required 1-2 assist and predominantly bed bound in last week   - GCS 3 on ED arrival -- intubated for airway protection  - UDS & alcohol negative 6/22  - CT head 6/22: Development of large subacute appearing bilateral occipitotemporal & moderate sized right corona radiata/capsular/basal ganglia infarcts. No e/o hemorrhage conversion, midline shift, or herniation. Stable chronic pontomedullary infarcts, basal ganglia capsular infarct, lacunar infarcts.  - MRI brain 6/23: Development of large bilateral acute-early subacute infarcts involving the right occipitotemporal region & left temporoparietal occipital region. Large areas of acute/early subacute infarct involving the bilateral corona radiata & centrum semiovale w/ scattered puntatate cortical infarcts. No herniation or hydrocephalus. Findings c/w superimposed microhemorrhage within b/l cerebral hemispheres & withiin areas of infarct. Additional acute-early subacute infarcts within the bilateral cerebral hemispheres, cerebral peduncles, & brainstem.  -  MRA head 6/23: Persistent occlusion of the proximal right intracranial vertebral arterial. Persistent occlusion of the proximal right intracranial vertebral artery. Multifocal intracranial arterial luminal stenoses, including the right paraclinoid internal carotid artery, right middle cerebral, and bilateral   posterior cerebral arteries, without large vessel occlusion.   - MRA neck 6/23: Severe stenosis of the right V1 segment. Mild multifocal luminal   irregularity the left cervical vertebral artery. Moderate stenosis of the mid left ICA with otherwise mild multifocal irregularity of the bilateral CCAs and cervical ICAs  - CT head 6/23 redemonstrates multifocal evolving recent cerebral infarcts   - EEG 6/23 c/w moderate-severe encephalopathy   - CT head 6/24 unchanged    - On exam obtunded & not following commands  PLAN  - Neurology following  - Q1 neurochecks  - Continue PTA plavix  & ASA w/ new infarcts  - Hold remaining PTA medications  Sedation for Mechanical Ventilation   - Propofol  off since 6/23  PLAN  - Fentanyl  12.28mcg Q2 PRN      PULM  Acute Hypoxic Respiratory Failure  Aspiration PNA  Mechanically Ventilated  - C/f atelectasis vs aspiration PNA noted in previous admission 5/8-5/17  - CXR 6/20 prior to discharge to SNF w/ RLL opacities w/ c/f PNA  - New oxygen requirement of 3L at SNF on 6/21  - Intubated for airway protection on ED arrival   - CXR 6/22: Patchy right basilar opacities, likely pneumonia vs aspiration. Low lung volumes w/ streaky left basilar opacities, likely atelectasis.atelectasis, RLL consolidation c/f PNA  - CT chest 6/22: Bilateral lower lobe consolidations with additional scattered patchy nodular opacities consistent with bilateral lower lobe pneumonia.     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 Segmental WMA w/ hypokinesis of apex & anterior lateral wall. Grade II LV diastolic dysfunction. RV size & function normal. No valvular issues.   - Currently NE (0.06)  - HR 80s  PLAN  - NE; SBP >120  - Continue PTA ASA 81mg  daily as above  - Hold PTA medications     GI  Dysphagia s/p PEG  Recent GIB  Mild Transaminitis (improving)  - Admit 6/12-6/20 w/ hematochezia; EGD unremarkable, colonoscopy w/ no active bleed. Bleeding ultimately resolved & resumed DAPT.  - CT abd/plvs 6/22: mild thickening of distal ileum suggesting nonspecific infectious or inflammatory enteritis. No significant ascites. Moderate diverticulosis coli, most prominent in the proximal sigmoid.   - Last BM 6/23  PLAN  - NPO; continue TFs   - Miralax  daily & senna BID   - PPI     RENAL  AKI  HAGMA  Lactic Acidosis (resolved)   Hypochloremia (resolved)   - Ddx pre-renal in the setting of reported diarrhea PTA vs ATN d/t sepsis  - Baseline Cr 0.8; on admit Cr 4.33  -  FENa 1%, indeterminate   - CT a/p w/ no hydro or renal stones  - Cr 6.22, CO2 16, AG 17   - UO 1L   PLAN  - BMP BID  - Strict I/Os  Elevated CPK   - CPK 2,533 --> 3,129 --> 971   - S/p 2L IVF in ED    ENDO  DMT2  Hyperglycemia  - PTA Aspart SSI, Lanus 20u daily   - Most recent A1c 5/8 - 11.7  - BHB 0.2  - Glucose > 500 on admit  PLAN  - Insulin  gtt (815 units/24hrs)      ID  PNA  Sepsis  UTI  Diarrhea  Hx E Coli UTI   - Prior E Coli UTI resistant to ampicillin & trimethsulfa; intermediate to Unasyn  - Reported diarrhea PTA  - BC 6/22 NGTD   - BC 6/23 NGTD   - UA 6/22 w/ negative nitrites, trace leuks, 20-50 WBCs; cx NGTD   - Fecal cx 6/22 pending  - Giardia 6/22 negative  - Shiga toxins 6/22 negative    - Cryptosporidium negative  - C Diff 6/22 negative  - MRSA & RVP 6/22 negative  - Sputum cx 6/24 NGTD   - Procal 6/22: 51  - WBC 9.6, febrile (tmax 100.18F)  PLAN  - Continue zosyn      HEME  Chronic Anemia  - Hgb 7.8, plt 185  - Heparin  for VTE ppx     MSK/SKIN  DTI, sacrum/coccyx and L heel  - Wound team consult placed    Prophylaxis Review:  Lines: Yes PIV & ART line   Tubes: ETT/Flexi/G tube   Insulin : Yes  Urinary Catheter: Yes  VTE ppx: Heparin ; SCDs  GI ppx: PPI    Code status: FULL   Disposition: ICU    Pt discussed with Dr. Nada     - Pt critically ill with the above diagnoses. I spent 60 minutes providing critical care services including:  reviewing outside records and obtaining history from the patient/family members  performing a physical examination  serially reviewing laboratory, telemetry, hemodynamic, oximetry, and respiratory data  reviewing radiographic images  reviewing medications  managing fluids/electrolytes, antibiotics, ICU prophylaxis, and mechanical ventilation   developing the overall plan of care.    ROEL Solian, APRN   Pulmonary Critical Care   Pager [phone]   12/04/2023    M2 team pager (2nd call/nights) [phone]      __________________________________________________________________________________    Subjective:        Kevin Obrien is a 73 y.o. male who unable to obtain d/t above     Objective:        Medications:  Scheduled Meds:albuterol -ipratropium (DUONEB) nebulizer solution 3 mL, 3 mL, Inhalation, Q4H & PRN  aspirin  chewable tablet 81 mg, 81 mg, Per G Tube, QDAY  clopiDOGreL  (PLAVIX ) tablet 75 mg, 75 mg, Per G Tube, QDAY  heparin  (porcine) PF syringe 5,000 Units, 5,000 Units, Subcutaneous, Q8H*  pantoprazole  (PROTONIX ) injection 40 mg, 40 mg, Intravenous, QDAY  piperacillin /tazobactam (ZOSYN ) 4.5 g in sodium chloride  0.9% (NS) 100 mL IVPB (MB+), 4.5 g, Intravenous, Q12H*  polyethylene glycol 3350  (MIRALAX ) packet 17 g, 1 packet, Per G Tube, QDAY  senna (SENOKOT) oral syrup 8.8 mg, 8.8 mg, Per G Tube, BID  thiamine  mononitrate (vit B1) tablet 100 mg, 100 mg, Per G Tube, QDAY    Continuous Infusions:   Diet Critical Care Enteral Feeding Volume Based Infusion 60 mL/hr at 12/03/23 1600  insulin  regular 100 units/NS 100 mL IV drip (premade) 19 Units/hr (12/04/23 0455)    norepinephrine  (LEVOPHED ) 4 mg in dextrose  5% (D5W) 250 mL IV drip (std conc) 0.06 mcg/kg/min (12/04/23 0456)     PRN and Respiratory Meds:acetaminophen  Q4H PRN, fentaNYL  citrate PF Q2H PRN, pancrelipase  20,880 Units/sodium bicarbonate  650 mg ([place] CLOG DESTROYER) PRN (On Call from Rx)                       Vital Signs: Last Filed                  Vital Signs: 24 Hour Range   BP: 125/51 (06/24 1400)  Temp: 38.1 ?C (100.6 ?F) (06/25 0400)  Pulse: 97 (06/25 0433)  Respirations: 24 PER MINUTE (06/25 0433)  SpO2: 100 % (06/25 0433)  O2%: 30 % (06/25 0433)  O2 Device: Ventilator (06/25 0433) BP: (125)/(51)   ABP: (104-156)/(33-46)   Temp:  [37.2 ?C (99 ?F)-38.3 ?C (100.9 ?F)]   Pulse:  [86-101]   Respirations:  [17 PER MINUTE-29 PER MINUTE]   SpO2:  [98 %-100 %]   O2%:  [30 %]   O2 Device: Ventilator     Vitals:    12/01/23 2022 12/02/23 0314 12/02/23 0500   Weight: 64.7 kg (142 lb 10.2 oz) 65.8 kg (145 lb) 65.8 kg (145 lb 1 oz)           Intake/Output Summary:  (Last 24 hours)    Intake/Output Summary (Last 24 hours) at 12/04/2023 0549  Last data filed at 12/04/2023 0500  Gross per 24 hour   Intake 2493.72 ml   Output 1738 ml   Net 755.72 ml         Physical Exam:         General: No distress, appears stated age  Lungs:  Clear upper & diminished lower bilaterally; no wheeze  Heart:  Regular rate and rhythm, S1, S2 normal, no murmur appreciated  Abdomen:  Soft, non-tender.  Bowel sounds normal  Neurologic:  Obtunded      Artificial airway:  Endotracheal Tube                                                                                       Ventilator/ Respiratory Therapy:  Yes: Invasive Mode: MMV  Set VT (mL):  [400 milliliters]   Expired VT Spontaneous (mL):  [511 milliliters-542 milliliters]   Expiratory VT (mL):  [511 mL-542 mL]   Set RR:  [18 breaths/minutes]   Total Respiratory Rate:  [23 breaths/minutes-25 breaths/minutes]   Minute Volume (Lpm):  [12.7 liters/minutes-14.3 liters/minutes]   %MVspontaneous:  [100 %]   PIP Actual (cmH2O):  [10 cm H20-11 cm H20]   PEEP/CPAP (cmH2O):  [5 cm H2O]   PS (cmH2O):  [5 cm H20]   Mean Airway Pressure (cmH2O):  [6 cm H2O-7 cm H2O]      Vent weaning trial:  Not applicable    Laboratory:  LABS:  Recent Labs     12/01/23  1721 12/01/23  1729 12/01/23  2041 12/02/23  0239 12/02/23  0933 12/02/23  1548 12/02/23  2000 12/03/23  0355  12/03/23  1531 12/04/23  0301   NA  --  128* 129* 131* 131* 134* 134* 134* 135* 138   K  --  4.3 4.6 5.0 3.7 3.5 3.4* 3.3* 3.4* 3.4*   CL  --  92* 93* 94* 98 100 100 100 103 105   CO2  --  21 20* 18* 17* 18* 17* 17* 15* 16*   GAP  --  15* 16* 19* 16* 16* 17* 17* 17* 17*   BUN  --  80* 74* 75* 75* 76* 77* 77* 79* 83*   CR 4.6* 4.37* 4.33* 4.67* 4.81* 5.04* 5.34* 5.53* 6.14* 6.22*   GLU  --  557* 502* 308* 273* 163* 140* 170* 221* 135*   CA  --  7.9* 7.7* 7.5* 7.4* 7.3* 7.9* 7.6* 7.9* 7.9*   ALBUMIN  --  2.9*  --  2.9*  --   --   --  2.6*  --  2.3*   MG  --  2.0  --  2.0  --   --   --  1.9  --  2.0   PO4  --   --   --  4.7*  --   --   --  3.5  --  3.2   HGBA1C  --   --   --  8.9*  --   --   --   --   --   --        Recent Labs     12/01/23  1729 12/01/23  2121 12/02/23  0239 12/03/23  0355 12/04/23  0301   WBC 11.00  --  11.80* 12.10* 9.60   HGB 7.7*  --  9.4* 9.0* 7.8*   HCT 21.5*  --  26.6* 24.7* 21.9*   PLTCT 165  --  173 195 185   PT  --  13.4  --   --   --    INR  --  1.2  --   --   --    PTT  --  28.3  --   --   --    AST 77*  --  100* 70* 61*   ALT 102*  --  93* 92* 90*   ALKPHOS 69  --  71 82 85      Estimated Creatinine Clearance: 9.6 mL/min (A) (by C-G formula based on SCr of 6.22 mg/dL (H)).  Vitals:    12/01/23 2022 12/02/23 0314 12/02/23 0500   Weight: 64.7 kg (142 lb 10.2 oz) 65.8 kg (145 lb) 65.8 kg (145 lb 1 oz)      Recent Labs     12/03/23  0355 12/04/23  0301   PHART 7.44 7.43   PO2ART 126* 152*           Radiology and Other Diagnostic Procedures Review:    Reviewed     Malnutrition Malnutrition present on admission  ICD-10 code E44: Acute illness/Moderate non-severe malnutrition  Mild loss of body fat, Mild loss of muscle mass            Loss of Subcutaneous Fat: Yes Mild Buccal  Muscle Wasting: Yes Mild Clavicle               Malnutrition Interventions: Resume TF

## 2023-12-04 NOTE — Progress Notes
 Principal Problem:    Acute respiratory failure (CMS-HCC)  Active Problems:    Essential hypertension    Dyslipidemia    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)    Ischemic stroke (CMS-HCC)    Acute encephalopathy    On mechanically assisted ventilation (CMS-HCC)    Aspiration pneumonia (CMS-HCC)    NSTEMI (non-ST elevated myocardial infarction) (CMS-HCC)    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 (CMS-HCC)    Anemia    History of gastrointestinal diverticular hemorrhage    Acute kidney injury    Elevated liver enzymes    Coronary artery disease    History of coronary artery stent placement    Diarrhea    Elevated creatine kinase level    Lactic acidosis    Sepsis, unspecified organism (CMS-HCC)    Elevated brain natriuretic peptide (BNP) level    Prolonged QT interval     Kevin Obrien is a 73 y.o. male with PMH of recent Acute R pontomedullary infarct, CAD s/p PCI, hx GI bleed, DM2 admitted from [facility] on 6/22 with acute/subacute large right occipitotemporal and left temporoparietal occipital stroke, aspiration pneumonia, AKI, and septic shock.  Remains on vent support with acute encephalopathy needing airway protection.   Continue ASA and plavix  in setting of stroke and concern for NSTEMI.   CT head yesterday stable. Remains unresponsive off sedation. Myoclonus noted right lower extremity on exam today. Appreciate neurology team input.   Continue zosyn  for pneumonia.   BG control with insulin  drip.   Continue norepi for BP targets as per neuro team.   Oliguric renal failure likely secondary to sepsis. Renal function stable today. No urgent indication for dialysis.   Overall prognosis appears poor which I discussed with the patients daughter at bedside today. Discussions regarding goals of care ongoing. Appreciate palliative care team support.     The patient is critically ill with problems listed above. I spent 45 mins time in addition to time spent by the NP performing critical care services including medication and record review, abx review, vasopressor mngt, fluid and electrolyte mngt, vent mngt, serial assessment and coordination of care.     [name], MD

## 2023-12-04 NOTE — Progress Notes
 Neurology Consult Note  Name: Kevin Obrien   MRN: [id number]     DOB: 1951-02-05      Age: 73 y.o.  Admission Date: 12/01/2023     LOS: 3 days     Date of Service: 12/04/2023       Reason for Consult:      New bilateral subacute CVAs, recent pontomedullary CVA     Consult type: Co-Management w/Signed Orders    Assessment: Kevin Obrien is a 73 y.o.  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    Assessment & Plan  Acute respiratory failure (CMS-HCC)    Essential hypertension    Dyslipidemia    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)    Ischemic stroke (CMS-HCC)    Acute encephalopathy    On mechanically assisted ventilation (CMS-HCC)    Aspiration pneumonia (CMS-HCC)    NSTEMI (non-ST elevated myocardial infarction) (CMS-HCC)    Anemia    Acute kidney injury    Elevated liver enzymes    Sepsis, unspecified organism (CMS-HCC)    Elevated brain natriuretic peptide (BNP) level    Prolonged QT interval      Present on Admission:   Acute respiratory failure (CMS-HCC)   Ischemic stroke (CMS-HCC)   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)   Essential hypertension   Dyslipidemia    Neuro exam: GCS 3, intubated pinpoint pupils sluggishly reactive, gag reflex intact, corneal reflex absent.  No spontaneous movements or grimace to noxious stimulation.  Diffuse hyperreflexia with triple flexion bilaterally.    Neuroimaging:    CT Head 06/25: Redemonstration of multifocal evolving recent cerebral infarcts without gross hemorrhagic conversion, midline shift, or herniation. The smaller cerebral, brainstem, and cerebellar infarcts are better demonstrated on comparison MRI. Stable mild generalized cerebral volume loss and moderate nonspecific white matter disease, likely due to chronic macrovascular ischemia, with several small superimposed chronic infarcts.    CT head 06/24: Redemonstration of multifocal evolving recent cerebral infarcts without gross hemorrhagic conversion. The smaller cerebral, brainstem, and cerebellar infarcts are better demonstrated on comparison MRI. Stable mild generalized cerebral volume loss and moderate nonspecific white matter disease, likely due to chronic macrovascular ischemia, with several small superimposed chronic infarcts.    CT Head 06/23: Redemonstration of multifocal evolving recent cerebral infarcts without gross hemorrhagic conversion. Posterior fossa and brainstem infarcts are seen to better advantage on MRI modality. Stable mild generalized cerebral volume loss and moderate nonspecific white matter disease, most likely due to chronic macrovascular ischemia, with several small superimposed chronic infarcts.     CT head 06/22: Development of large subacute appearing bilateral occipitotemporal and moderate sized right corona radiata/capsular/basal ganglia infarcts. No evidence of hemorrhagic conversion. No midline shift or herniation.  Stable chronic pontomedullary infarcts, left basal gallbladder-capsular infarct, and old lacunar infarcts, better demonstrated on prior MRI.       Impression: Patient with a recent pontomedullary stroke and extensive ICAD who presents with stepwise, slowly progressive encephalopathy over the last week in the context of acute blood loss anemia and SIRS/sepsis.  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    Recommendations  -  No need to repeat CT head since all of them have been stable so far  - Rest of care per ICU  - Continue Plavix  75 mg daily for now even though P2Y12 is normal (history of poor compliance)  - Family working with Palliative to determine goals of care      Thank you for allowing us  to participate in the care of your patient. Please contact Neurology team 1 on [facility] with any questions or concerns.    Aidan Alemifar MS3      ATTESTATION    I personally performed or re-performed the history, physical exam and treatment plan for the E/M. I discussed the case with the Medical Student, and concur with the Medical Student documentation of history, physical exam and treatment plan unless otherwise noted.    Resident name:  Belvie Mccune Date:  12/04/2023   ______________________________________________________________________________________________  Subjective    Patient's showed subtle signs of improvement today. He exhibited some spontaneous head movement, oriented towards some stimuli, and exhibited a slight corneal reflex, which had been absent to this point. The gag reflex remained intact. Patient's family was informed that these changes do not necessarily change expectations for recovery, especially regarding future functional deficits/care needs.     Medical/Surgical/Social/Family History{  Past Medical History:    Accidental fall    Angina pectoris    Arthritis    BPH (benign prostatic hyperplasia)    Cataract    Constipation    Coronary artery disease    DM (diabetes mellitus) (CMS-HCC)    ED (erectile dysfunction)    Heart attack (CMS-HCC)    Hyperlipidemia    Hypertension    Joint pain    Neuropathy    Shingles     Surgical History:   Procedure Laterality Date    CORONARY STENT PLACEMENT  2016    HX CHOLECYSTECTOMY  09/2015    COLONOSCOPY N/A 04/20/2016    Performed by Rogers Kipper, MD at [facility] ENDO    ESOPHAGOGASTRODUODENOSCOPY N/A 04/20/2016    Performed by Rogers Kipper, MD at [facility] ENDO    ESOPHAGOGASTRODUODENOSCOPY BIOPSY  04/20/2016    Performed by Rogers Kipper, MD at [facility] ENDO    UREA BREATH TEST N/A 11/13/2016    Performed by Ledora Catalina, MD at [facility] ENDO    ESOPHAGOGASTRODUODENOSCOPY WITH BIOPSY - FLEXIBLE N/A 11/22/2023    Performed by Bonnetta Camellia BROCKS, MD at [facility] ENDO    COLONOSCOPY WITH BIOPSY - FLEXIBLE N/A 11/22/2023    Performed by Bonnetta Camellia BROCKS, MD at [facility] ENDO     Social History     Tobacco Use    Smoking status: Former     Current packs/day: 0.00     Average packs/day: 2.0 packs/day for 10.0 years (20.0 ttl pk-yrs)     Types: Cigarettes     Start date: 04/11/1961     Quit date: 04/12/1971     Years since quitting: 52.6    Smokeless tobacco: Never    Tobacco comments:     Former smoker/quit years ago   Vaping Use    Vaping status: Never Used   Substance and Sexual Activity    Alcohol use: Not Currently    Drug use: Never    Sexual activity: Not Currently     Partners: Female     Birth control/protection: None           Family History   Problem Relation Name Age of Onset    Diabetes Mother AMPARO CUBIAS  Diabetes Father JOSE Mullarkey     Cancer-Lung Father JOSE Fontanilla     Melanoma Neg Hx      Cataract Neg Hx      Glaucoma Neg Hx      Macular Degen Neg Hx       Allergies: Seasonal allergies  Current Medications    Medication Directions   acetaminophen  (TYLENOL ) 325 mg tablet Take two tablets via feeding tube every 6 hours as needed for Pain. Indications: pain  Patient taking differently: two tablets by Per G Tube route every 6 hours as needed for Pain. Indications: pain   amLODIPine  (NORVASC ) 5 mg tablet one tablet by Per NG tube route daily. Indications: high blood pressure  Patient taking differently: one tablet by Per G Tube route daily. Indications: high blood pressure   amoxicillin /K clavulanate (AUGMENTIN ) 400 mg/5 mL oral suspension 10.9 mL by Per NG tube route twice daily for 7 days. Take with food.  Indications: a lower respiratory infection  Patient taking differently: 875 mg by Per G Tube route twice daily. Take with food.  Indications: a lower respiratory infection   artificial tears (PF) single dose ophthalmic solution Apply two drops to both eyes three times daily. Indications: dry eye   aspirin  81 mg chewable tablet one tablet by Per NG tube route daily. Indications: stroke prevention  Patient taking differently: one tablet by Per G Tube route daily. Indications: stroke prevention   clopiDOGreL  (PLAVIX ) 75 mg tablet one tablet by Per NG tube route daily. Indications: prevention for a blood clot going to the brain  Patient taking differently: one tablet by Per G Tube route daily. Indications: prevention for a blood clot going to the brain   dextrose  50% (D50) syringe Administer 25-50 mL through vein as Needed (=< 70 mg/dL: See admin instructions). Indications: hypoglycemia   diclofenac  sodium (VOLTAREN ) 1 % topical gel Apply two g topically to affected area four times daily. Indications: pain   doxazosin  (CARDURA ) 4 mg tablet Take one tablet via feeding tube at bedtime daily. Indications: enlarged prostate with urination problem  Patient taking differently: one tablet by Per G Tube route at bedtime daily. Indications: enlarged prostate with urination problem   insulin  aspart (U-100) (NOVOLOG  FLEXPEN U-100 INSULIN ) 100 unit/mL (3 mL) PEN Inject twelve Units under the skin four times daily. PLUS Correction Factor if: BS 181-220=2 units, BS 221-260=4 units, BS 261-300=6 units, BS 301-350=8 units, BS 351-400=10 units, BS >400=12 units  Indications: type 2 diabetes mellitus   insulin  glargine (LANTUS  SOLOSTAR U-100 INSULIN ) 100 unit/mL (3 mL) subcutaneous PEN Inject twenty Units under the skin daily. Indications: type 2 diabetes mellitus  Patient taking differently: Inject twenty Units under the skin at bedtime daily. Indications: type 2 diabetes mellitus   Lactose-Free Food with Fiber (JEVITY 1.5 CAL) 0.06 gram-1.5 kcal/mL liqd Take 445 mL by mouth four times daily.   lidocaine  (LIDODERM ) 5 % topical patch Apply one patch topically to affected area daily. Apply patch for 12 hours, then remove for 12 hours before repeating.  Indications: neuropathic pain   lidocaine  hcl (XYLOCAINE ) 2 % mucosal jelly Apply  topically to affected area as Needed. Indications: local anesthesia for urethral pain  Patient taking differently: Apply  topically to affected area every 24 hours as needed. Indications: local anesthesia for urethral pain   melatonin 5 mg tablet Take one tablet via feeding tube at bedtime daily. Indications: difficulty sleeping  Patient taking differently: one tablet by Per G Tube route at bedtime daily.  Indications: difficulty sleeping   mirtazapine  (REMERON ) 15 mg tablet Take one tablet via feeding tube at bedtime daily. Indications: major depressive disorder  Patient taking differently: one tablet by Per G Tube route at bedtime daily. Indications: major depressive disorder   modafiniL  (PROVIGIL ) 100 mg tablet Take two tablets via feeding tube daily AND one tablet daily. Indications: post-stroke sleepiness   petrolatum  (STYE) 57.7-31.9 % oint ophthalmic ointment Apply one-quarter inch to both eyes three times daily. Indications: dry eye   phenoL (CHLORASEPTIC) 1.4 % mouth spray Take two sprays by mouth or throat as directed as Needed. Indications: irritation of the mouth  Patient taking differently: Take two sprays by mouth or throat as directed every 4 hours as needed. Indications: irritation of the mouth   propranoloL  (INDERAL ) 10 mg tablet one tablet by Per NG tube route twice daily. Indications: high blood pressure  Patient taking differently: one tablet by Per G Tube route twice daily. Indications: high blood pressure   rosuvastatin  (CRESTOR ) 40 mg tablet Take one tablet via feeding tube daily. Indications: excessive fat in the blood  Patient taking differently: one tablet by Per G Tube route at bedtime daily. Indications: excessive fat in the blood   sennosides-docusate sodium  (SENOKOT-S) 8.6/50 mg tablet one tablet by Per G Tube route daily as needed (Constipation - First Line). Indications: constipation   thiamine  mononitrate (vit B1) 100 mg tablet one tablet by Per NG tube route daily. Indications: deficiency in thiamine  or vitamin B1  Patient taking differently: one tablet by Per G Tube route daily. Indications: deficiency in thiamine  or vitamin B1   trazodone (#) (DESYREL ) 10 mg/mL Take 1.25ml (12.5 mg) twice daily as needed and 2.5ml (25 mg) nightly at bed time  Indications: PRN for agitation       Objective                        Vital Signs: Last Filed                 Vital Signs: 24 Hour Range   BP: 125/51 (06/24 1400)  Temp: 37.9 ?C (100.2 ?F) (06/25 1100)  Pulse: 98 (06/25 1100)  Respirations: 25 PER MINUTE (06/25 1100)  SpO2: 98 % (06/25 1100)  O2%: 30 % (06/25 1100)  O2 Device: Ventilator (06/25 1100) BP: (125)/(51)   ABP: (104-156)/(33-49)   Temp:  [37.2 ?C (99 ?F)-38.3 ?C (100.9 ?F)]   Pulse:  [83-101]   Respirations:  [17 PER MINUTE-29 PER MINUTE]   SpO2:  [98 %-100 %]   O2%:  [30 %]   O2 Device: Ventilator   CPOT Score Total: 0 (12/04/23 0800) Vitals:    12/01/23 2022 12/02/23 0314 12/02/23 0500   Weight: 64.7 kg (142 lb 10.2 oz) 65.8 kg (145 lb) 65.8 kg (145 lb 1 oz)         Intake/Output Summary:  (Last 24 hours)    Intake/Output Summary (Last 24 hours) at 12/04/2023 1213  Last data filed at 12/04/2023 1200  Gross per 24 hour   Intake 2265.8 ml   Output 1845 ml   Net 420.8 ml     Stool Occurrence: 1        Physical Exam  General physical exam:  HEENT: normocephalic  Chest: Intubated      Physical Exam:    Blood pressure 125/51, pulse 98, temperature 37.9 ?C (100.2 ?F), height 172.7 cm (5' 8), weight 65.8 kg (145 lb 1 oz), SpO2 98%.  Neuro:   Mental Status: GCS 3, not open eyes to noxious stimuli   Cranial Nerves:       - Pupil exam: Size: Pinpoint, sluggishly reactive    - EOM: No doll's eye                 - Corneal reflex: Minimally present    - Grimace/facial movement: absent     - Gag reflex: Present    Motor:         RUE: Strength: 0/5                     RLE: Strength: 0/5            LUE: Strength: 0/5         LLE: Strength: 0/5   Sensory: No grimacing/withdrawal to noxious stimulation   Coordination: NA   DTRs: +3 all over, triple flexion bilaterally   Gait: NA      Lab/Radiology/Other Diagnostic Tests:  Results for orders placed or performed during the hospital encounter of 12/01/23 (from the past 24 hours)   POC GLUCOSE    Collection Time: 12/03/23 12:14 PM   Result Value Ref Range    Glucose, POC 213 (H) 70 - 100 mg/dL   POC GLUCOSE    Collection Time: 12/03/23  1:23 PM   Result Value Ref Range    Glucose, POC 211 (H) 70 - 100 mg/dL   POC GLUCOSE    Collection Time: 12/03/23  2:25 PM   Result Value Ref Range    Glucose, POC 206 (H) 70 - 100 mg/dL   POC GLUCOSE    Collection Time: 12/03/23  3:25 PM   Result Value Ref Range    Glucose, POC 203 (H) 70 - 100 mg/dL   BASIC METABOLIC PANEL    Collection Time: 12/03/23  3:31 PM   Result Value Ref Range    Sodium 135 (L) 137 - 147 mmol/L    Potassium 3.4 (L) 3.5 - 5.1 mmol/L    Chloride 103 98 - 110 mmol/L    Glucose 221 (H) 70 - 100 mg/dL    Blood Urea Nitrogen 79 (H) 7 - 25 mg/dL    Creatinine 3.85 (H) 0.40 - 1.24 mg/dL    Calcium 7.9 (L) 8.5 - 10.6 mg/dL    CO2 15 (L) 21 - 30 mmol/L    Anion Gap 17 (H) 3 - 12    Glomerular Filtration Rate (GFR) 9 (L) >60 mL/min   POC GLUCOSE    Collection Time: 12/03/23  4:24 PM   Result Value Ref Range    Glucose, POC 195 (H) 70 - 100 mg/dL   POC GLUCOSE    Collection Time: 12/03/23  5:30 PM   Result Value Ref Range    Glucose, POC 188 (H) 70 - 100 mg/dL   POC GLUCOSE    Collection Time: 12/03/23  6:12 PM   Result Value Ref Range    Glucose, POC 183 (H) 70 - 100 mg/dL   POC GLUCOSE    Collection Time: 12/03/23  6:44 PM   Result Value Ref Range    Glucose, POC 180 (H) 70 - 100 mg/dL   POC GLUCOSE    Collection Time: 12/03/23  7:05 PM   Result Value Ref Range    Glucose, POC 178 (H) 70 - 100 mg/dL   POC GLUCOSE    Collection Time: 12/03/23  7:38 PM   Result Value Ref Range  Glucose, POC 170 (H) 70 - 100 mg/dL   POC GLUCOSE    Collection Time: 12/03/23 8:11 PM   Result Value Ref Range    Glucose, POC 172 (H) 70 - 100 mg/dL   POC GLUCOSE    Collection Time: 12/03/23  8:35 PM   Result Value Ref Range    Glucose, POC 163 (H) 70 - 100 mg/dL   POC GLUCOSE    Collection Time: 12/03/23  8:58 PM   Result Value Ref Range    Glucose, POC 169 (H) 70 - 100 mg/dL   POC GLUCOSE    Collection Time: 12/03/23  9:36 PM   Result Value Ref Range    Glucose, POC 167 (H) 70 - 100 mg/dL   POC GLUCOSE    Collection Time: 12/03/23 10:03 PM   Result Value Ref Range    Glucose, POC 165 (H) 70 - 100 mg/dL   POC GLUCOSE    Collection Time: 12/03/23 10:32 PM   Result Value Ref Range    Glucose, POC 166 (H) 70 - 100 mg/dL   POC GLUCOSE    Collection Time: 12/03/23 11:06 PM   Result Value Ref Range    Glucose, POC 157 (H) 70 - 100 mg/dL   POC GLUCOSE    Collection Time: 12/03/23 11:30 PM   Result Value Ref Range    Glucose, POC 151 (H) 70 - 100 mg/dL   POC GLUCOSE    Collection Time: 12/04/23 12:37 AM   Result Value Ref Range    Glucose, POC 153 (H) 70 - 100 mg/dL   POC GLUCOSE    Collection Time: 12/04/23  1:05 AM   Result Value Ref Range    Glucose, POC 142 (H) 70 - 100 mg/dL   POC GLUCOSE    Collection Time: 12/04/23  1:51 AM   Result Value Ref Range    Glucose, POC 133 (H) 70 - 100 mg/dL   CBC AND DIFF    Collection Time: 12/04/23  3:01 AM   Result Value Ref Range    White Blood Cells 9.60 4.50 - 11.00 10*3/uL    Red Blood Cells 2.60 (L) 4.40 - 5.50 10*6/uL    Hemoglobin 7.8 (L) 13.5 - 16.5 g/dL    Hematocrit 78.0 (L) 40.0 - 50.0 %    MCV 84.3 80.0 - 100.0 fL    MCH 30.0 26.0 - 34.0 pg    MCHC 35.6 32.0 - 36.0 g/dL    RDW 86.0 88.9 - 84.9 %    Platelet Count 185 150 - 400 10*3/uL    MPV 9.9 7.0 - 11.0 fL    Neutrophils 80.2 (H) 41.0 - 77.0 %    Lymphocytes 10.1 (L) 24.0 - 44.0 %    Monocytes 8.5 4.0 - 12.0 %    Eosinophils 0.8 0.0 - 5.0 %    Basophils 0.4 0.0 - 2.0 %    Absolute Neutrophil Count 7.70 (H) 1.80 - 7.00 10*3/uL    Absolute Lymph Count 1.00 1.00 - 4.80 10*3/uL    Absolute Monocyte Count 0.80 0.00 - 0.80 10*3/uL    Absolute Eosinophil Count 0.10 0.00 - 0.45 10*3/uL    Absolute Basophil Count 0.00 0.00 - 0.20 10*3/uL    MDW (Monocyte Distribution Width) 23.9 (H) <=20.6   COMPREHENSIVE METABOLIC PANEL    Collection Time: 12/04/23  3:01 AM   Result Value Ref Range    Sodium 138 137 - 147 mmol/L    Potassium 3.4 (L) 3.5 -  5.1 mmol/L    Chloride 105 98 - 110 mmol/L    Glucose 135 (H) 70 - 100 mg/dL    Blood Urea Nitrogen 83 (H) 7 - 25 mg/dL    Creatinine 3.77 (H) 0.40 - 1.24 mg/dL    Calcium 7.9 (L) 8.5 - 10.6 mg/dL    Total Protein 5.4 (L) 6.0 - 8.0 g/dL    Total Bilirubin 0.4 0.2 - 1.3 mg/dL    Albumin 2.3 (L) 3.5 - 5.0 g/dL    Alk Phosphatase 85 25 - 110 U/L    AST 61 (H) 7 - 40 U/L    ALT 90 (H) 7 - 56 U/L    CO2 16 (L) 21 - 30 mmol/L    Anion Gap 17 (H) 3 - 12    Glomerular Filtration Rate (GFR) 9 (L) >60 mL/min   MAGNESIUM   CELLULAR THERAPEUTICS    Collection Time: 12/04/23  3:01 AM   Result Value Ref Range    Magnesium  2.0 1.6 - 2.6 mg/dL   PHOSPHORUS  CELLULAR THERAPEUTICS    Collection Time: 12/04/23  3:01 AM   Result Value Ref Range    Phosphorus 3.2 2.0 - 4.5 mg/dL   BLOOD GASES, ARTERIAL    Collection Time: 12/04/23  3:01 AM   Result Value Ref Range    pH 7.43 7.35 - 7.45    pCO2-Arterial 25 (L) 35 - 45 mmHg    pO2-Arterial 152 (H) 80 - 100 mmHg    Base Deficit-Arterial 7.1 mmol/L    O2 SAT-Arterial 99.9 (H) 95.0 - 99.0 %    Bicarbonate-ART-Cal 18.6 (L) 21.0 - 28.0 mmol/L    FiO2 Value Arterial     POC GLUCOSE    Collection Time: 12/04/23  3:03 AM   Result Value Ref Range    Glucose, POC 116 (H) 70 - 100 mg/dL   POC GLUCOSE    Collection Time: 12/04/23  4:54 AM   Result Value Ref Range    Glucose, POC 115 (H) 70 - 100 mg/dL   POC GLUCOSE    Collection Time: 12/04/23  6:49 AM   Result Value Ref Range    Glucose, POC 106 (H) 70 - 100 mg/dL   POC GLUCOSE    Collection Time: 12/04/23  8:34 AM   Result Value Ref Range    Glucose, POC 93 70 - 100 mg/dL   POC GLUCOSE    Collection Time: 12/04/23  9:36 AM   Result Value Ref Range    Glucose, POC 94 70 - 100 mg/dL   POC GLUCOSE    Collection Time: 12/04/23 10:32 AM   Result Value Ref Range    Glucose, POC 102 (H) 70 - 100 mg/dL   POC GLUCOSE    Collection Time: 12/04/23 11:30 AM   Result Value Ref Range    Glucose, POC 99 70 - 100 mg/dL     Radiology  (Last 24 hours)                 12/03/23 1756  CT HEAD WO CONTRAST Final result    Impression:          1.  Redemonstration of multifocal evolving recent cerebral infarcts without gross hemorrhagic conversion. The smaller cerebral, brainstem, and cerebellar infarcts are better demonstrated on comparison MRI.   2.  Stable mild generalized cerebral volume loss and moderate nonspecific white matter disease, likely due to chronic macrovascular ischemia, with several small superimposed chronic infarcts.  Finalized by Alm Schwab, M.D. on 12/03/2023 6:04 PM. Dictated by Alm Schwab, M.D. on 12/03/2023 5:59 PM.                 I personally reviewed the following MRI, CT   24-hour labs:    Results for orders placed or performed during the hospital encounter of 12/01/23 (from the past 24 hours)   POC GLUCOSE    Collection Time: 12/03/23 12:14 PM   Result Value Ref Range    Glucose, POC 213 (H) 70 - 100 mg/dL   POC GLUCOSE    Collection Time: 12/03/23  1:23 PM   Result Value Ref Range    Glucose, POC 211 (H) 70 - 100 mg/dL   POC GLUCOSE    Collection Time: 12/03/23  2:25 PM   Result Value Ref Range    Glucose, POC 206 (H) 70 - 100 mg/dL   POC GLUCOSE    Collection Time: 12/03/23  3:25 PM   Result Value Ref Range    Glucose, POC 203 (H) 70 - 100 mg/dL   BASIC METABOLIC PANEL    Collection Time: 12/03/23  3:31 PM   Result Value Ref Range    Sodium 135 (L) 137 - 147 mmol/L    Potassium 3.4 (L) 3.5 - 5.1 mmol/L    Chloride 103 98 - 110 mmol/L    Glucose 221 (H) 70 - 100 mg/dL    Blood Urea Nitrogen 79 (H) 7 - 25 mg/dL    Creatinine 3.85 (H) 0.40 - 1.24 mg/dL    Calcium 7.9 (L) 8.5 - 10.6 mg/dL    CO2 15 (L) 21 - 30 mmol/L    Anion Gap 17 (H) 3 - 12    Glomerular Filtration Rate (GFR) 9 (L) >60 mL/min   POC GLUCOSE    Collection Time: 12/03/23  4:24 PM   Result Value Ref Range    Glucose, POC 195 (H) 70 - 100 mg/dL   POC GLUCOSE    Collection Time: 12/03/23  5:30 PM   Result Value Ref Range    Glucose, POC 188 (H) 70 - 100 mg/dL   POC GLUCOSE    Collection Time: 12/03/23  6:12 PM   Result Value Ref Range    Glucose, POC 183 (H) 70 - 100 mg/dL   POC GLUCOSE    Collection Time: 12/03/23  6:44 PM   Result Value Ref Range    Glucose, POC 180 (H) 70 - 100 mg/dL   POC GLUCOSE    Collection Time: 12/03/23  7:05 PM   Result Value Ref Range    Glucose, POC 178 (H) 70 - 100 mg/dL   POC GLUCOSE    Collection Time: 12/03/23  7:38 PM   Result Value Ref Range    Glucose, POC 170 (H) 70 - 100 mg/dL   POC GLUCOSE    Collection Time: 12/03/23  8:11 PM   Result Value Ref Range    Glucose, POC 172 (H) 70 - 100 mg/dL   POC GLUCOSE    Collection Time: 12/03/23  8:35 PM   Result Value Ref Range    Glucose, POC 163 (H) 70 - 100 mg/dL   POC GLUCOSE    Collection Time: 12/03/23  8:58 PM   Result Value Ref Range    Glucose, POC 169 (H) 70 - 100 mg/dL   POC GLUCOSE    Collection Time: 12/03/23  9:36 PM   Result Value Ref Range    Glucose, POC  167 (H) 70 - 100 mg/dL   POC GLUCOSE    Collection Time: 12/03/23 10:03 PM   Result Value Ref Range    Glucose, POC 165 (H) 70 - 100 mg/dL   POC GLUCOSE    Collection Time: 12/03/23 10:32 PM   Result Value Ref Range    Glucose, POC 166 (H) 70 - 100 mg/dL   POC GLUCOSE    Collection Time: 12/03/23 11:06 PM   Result Value Ref Range    Glucose, POC 157 (H) 70 - 100 mg/dL   POC GLUCOSE    Collection Time: 12/03/23 11:30 PM   Result Value Ref Range    Glucose, POC 151 (H) 70 - 100 mg/dL   POC GLUCOSE    Collection Time: 12/04/23 12:37 AM   Result Value Ref Range    Glucose, POC 153 (H) 70 - 100 mg/dL   POC GLUCOSE    Collection Time: 12/04/23  1:05 AM   Result Value Ref Range    Glucose, POC 142 (H) 70 - 100 mg/dL   POC GLUCOSE    Collection Time: 12/04/23  1:51 AM   Result Value Ref Range    Glucose, POC 133 (H) 70 - 100 mg/dL   CBC AND DIFF    Collection Time: 12/04/23  3:01 AM   Result Value Ref Range    White Blood Cells 9.60 4.50 - 11.00 10*3/uL    Red Blood Cells 2.60 (L) 4.40 - 5.50 10*6/uL    Hemoglobin 7.8 (L) 13.5 - 16.5 g/dL    Hematocrit 78.0 (L) 40.0 - 50.0 %    MCV 84.3 80.0 - 100.0 fL    MCH 30.0 26.0 - 34.0 pg    MCHC 35.6 32.0 - 36.0 g/dL    RDW 86.0 88.9 - 84.9 %    Platelet Count 185 150 - 400 10*3/uL    MPV 9.9 7.0 - 11.0 fL    Neutrophils 80.2 (H) 41.0 - 77.0 %    Lymphocytes 10.1 (L) 24.0 - 44.0 %    Monocytes 8.5 4.0 - 12.0 %    Eosinophils 0.8 0.0 - 5.0 %    Basophils 0.4 0.0 - 2.0 %    Absolute Neutrophil Count 7.70 (H) 1.80 - 7.00 10*3/uL    Absolute Lymph Count 1.00 1.00 - 4.80 10*3/uL    Absolute Monocyte Count 0.80 0.00 - 0.80 10*3/uL    Absolute Eosinophil Count 0.10 0.00 - 0.45 10*3/uL    Absolute Basophil Count 0.00 0.00 - 0.20 10*3/uL    MDW (Monocyte Distribution Width) 23.9 (H) <=20.6   COMPREHENSIVE METABOLIC PANEL    Collection Time: 12/04/23  3:01 AM   Result Value Ref Range    Sodium 138 137 - 147 mmol/L    Potassium 3.4 (L) 3.5 - 5.1 mmol/L    Chloride 105 98 - 110 mmol/L    Glucose 135 (H) 70 - 100 mg/dL    Blood Urea Nitrogen 83 (H) 7 - 25 mg/dL    Creatinine 3.77 (H) 0.40 - 1.24 mg/dL    Calcium 7.9 (L) 8.5 - 10.6 mg/dL    Total Protein 5.4 (L) 6.0 - 8.0 g/dL    Total Bilirubin 0.4 0.2 - 1.3 mg/dL    Albumin 2.3 (L) 3.5 - 5.0 g/dL    Alk Phosphatase 85 25 - 110 U/L    AST 61 (H) 7 - 40 U/L    ALT 90 (H) 7 - 56 U/L    CO2 16 (L) 21 -  30 mmol/L    Anion Gap 17 (H) 3 - 12    Glomerular Filtration Rate (GFR) 9 (L) >60 mL/min   MAGNESIUM   CELLULAR THERAPEUTICS    Collection Time: 12/04/23  3:01 AM   Result Value Ref Range    Magnesium  2.0 1.6 - 2.6 mg/dL   PHOSPHORUS  CELLULAR THERAPEUTICS    Collection Time: 12/04/23  3:01 AM   Result Value Ref Range    Phosphorus 3.2 2.0 - 4.5 mg/dL   BLOOD GASES, ARTERIAL    Collection Time: 12/04/23  3:01 AM   Result Value Ref Range    pH 7.43 7.35 - 7.45    pCO2-Arterial 25 (L) 35 - 45 mmHg    pO2-Arterial 152 (H) 80 - 100 mmHg    Base Deficit-Arterial 7.1 mmol/L    O2 SAT-Arterial 99.9 (H) 95.0 - 99.0 %    Bicarbonate-ART-Cal 18.6 (L) 21.0 - 28.0 mmol/L    FiO2 Value Arterial     POC GLUCOSE    Collection Time: 12/04/23  3:03 AM   Result Value Ref Range    Glucose, POC 116 (H) 70 - 100 mg/dL   POC GLUCOSE    Collection Time: 12/04/23  4:54 AM   Result Value Ref Range    Glucose, POC 115 (H) 70 - 100 mg/dL   POC GLUCOSE    Collection Time: 12/04/23  6:49 AM   Result Value Ref Range    Glucose, POC 106 (H) 70 - 100 mg/dL   POC GLUCOSE    Collection Time: 12/04/23  8:34 AM   Result Value Ref Range    Glucose, POC 93 70 - 100 mg/dL   POC GLUCOSE    Collection Time: 12/04/23  9:36 AM   Result Value Ref Range    Glucose, POC 94 70 - 100 mg/dL   POC GLUCOSE    Collection Time: 12/04/23 10:32 AM   Result Value Ref Range    Glucose, POC 102 (H) 70 - 100 mg/dL   POC GLUCOSE    Collection Time: 12/04/23 11:30 AM   Result Value Ref Range    Glucose, POC 99 70 - 100 mg/dL

## 2023-12-04 NOTE — Progress Notes
 Patient taken to CT scan via bed on monitor and vent with Resource RN, RT and tech.  Patient tolerated travel well.  Returned to room and placed back on bedside monitor.

## 2023-12-04 NOTE — Progress Notes
 Weaning Readiness Screen Met (RT Only):: No, no evidence of improvement of reversal of cause of respiratory failure (e.g. fluid overload, pneumonia, ARDS, etc.);No, patient failed SAT and RASS is >1 or <-2

## 2023-12-04 NOTE — Case Management (ED)
 Case Management Progress Note    NAME:Kevin Obrien                          MRN: [id number]              DOB:[birth year]          AGE: 73 y.o.  ADMISSION DATE: 12/01/2023             DAYS ADMITTED: LOS: 3 days      Today's Date: 12/04/2023    PLAN:  Dc planning ongoing, pt remains ICU status with ongoing medical work up.     Expected Discharge Date: 12/06/2023   Is Patient Medically Stable: No  Are there Barriers to Discharge? NO    INTERVENTION/DISPOSITION:  Discharge Planning               SW reviewed pt's EMR and participated in MICU Huddle where the following was discussed for pt's plan of care;   Pt is ICU status  Pt remains intubated, palliative care has been consulted with ongoing goals of care conversation.   CM team continues to follow along and help with dc planning when appropriate. Pt will need to work with PT/OT when appropriate to help with dc plans.   Transportation              Does the Patient Need Case Management to Arrange Discharge Transport? (ex: facility, ambulance, wheelchair/stretcher, Medicaid, cab, other): Yes  Will the Patient Use Family Transport?: No  Support                 Info or Referral                 Positive SDOH Domains and Potential Barriers                   Medication Needs                                                                                                                                                         Financial                 Legal                 Other                 Discharge Disposition  [name]    On [employer]

## 2023-12-05 ENCOUNTER — Encounter: Admit: 2023-12-05 | Discharge: 2023-12-05 | Payer: MEDICARE

## 2023-12-05 LAB — POC GLUCOSE
~~LOC~~ BKR POC GLUCOSE: 109 mg/dL — ABNORMAL HIGH (ref 70–100)
~~LOC~~ BKR POC GLUCOSE: 98 mg/dL (ref 70–100)
~~LOC~~ BKR POC GLUCOSE: 100 mg/dL (ref 70–100)
~~LOC~~ BKR POC GLUCOSE: 102 mg/dL — ABNORMAL HIGH (ref 70–100)
~~LOC~~ BKR POC GLUCOSE: 107 mg/dL — ABNORMAL HIGH (ref 70–100)
~~LOC~~ BKR POC GLUCOSE: 115 mg/dL — ABNORMAL HIGH (ref 70–100)
~~LOC~~ BKR POC GLUCOSE: 119 mg/dL — ABNORMAL HIGH (ref 70–100)
~~LOC~~ BKR POC GLUCOSE: 126 mg/dL — ABNORMAL HIGH (ref 70–100)
~~LOC~~ BKR POC GLUCOSE: 115 mg/dL — ABNORMAL HIGH (ref 70–100)
~~LOC~~ BKR POC GLUCOSE: 118 mg/dL — ABNORMAL HIGH (ref 70–100)
~~LOC~~ BKR POC GLUCOSE: 127 mg/dL — ABNORMAL HIGH (ref 70–100)
~~LOC~~ BKR POC GLUCOSE: 116 mg/dL — ABNORMAL HIGH (ref 70–100)
~~LOC~~ BKR POC GLUCOSE: 105 mg/dL — ABNORMAL HIGH (ref 70–100)
~~LOC~~ BKR POC GLUCOSE: 116 mg/dL — ABNORMAL HIGH (ref 70–100)
~~LOC~~ BKR POC GLUCOSE: 125 mg/dL — ABNORMAL HIGH (ref 70–100)

## 2023-12-05 LAB — CULTURE - RESPIRATORY

## 2023-12-05 LAB — CULTURE-FECES W/SENSITIVITY

## 2023-12-05 MED ORDER — TUBE FEED (ENAR)
0 refills | Status: DC
Start: 2023-12-05 — End: 2023-12-07

## 2023-12-05 NOTE — Progress Notes
 CLINICAL NUTRITION                                                        Clinical Nutrition Follow-Up Assessment     Name: Kevin Obrien   MRN: [id number]     DOB: [birth year]      Age: 73 y.o.  Admission Date: 12/01/2023     LOS: 4 days     Date of Service: 12/05/2023        Recommendation:  Trial renal formula Novasource, goal rate 53ml/h with volume-based feeding. Provides 1680kcal, 78g protein, free H2O. H2O boluses per team.    Comments:  Patient remains intubated (day 4) with weaning pressor requirements. Vital AF has been running as ordered; no s/s intolerances. AKI- Improving UOP, but Cr remains high; no plans for RRT; but ongoing monitoring/GOC discussions.                           Nutrition Assessment of Patient:  Admit Weight: 65.8 kg;  ;    BMI (Calculated): 22.05;    Pertinent Allergies/Intolerances: none pertinent noted  Current EN Order: Vital AF at 68ml/hr with volume-based feeding. Provides 1728kcal, 108g protein, free H2O.  Intake Comment: 2-day EN intake average:  Intake (calories) Daily Average : 1605 kilocalories  Intake (protein) Daily Average : 100 grams  Weight Used for Calculation: 65.8 kg  Estimated Calorie Needs: 1665-1850kcal (90-100% calculated RMR or 26-28kcal/kg admit wt)  Estimated Protein Needs: 76-95g (1.2-1.5g/kg admit wt)    Malnutrition Assessment:  Malnutrition present on admission  ICD-10 code E44: Acute illness/Moderate non-severe malnutrition  Mild loss of body fat, Mild loss of muscle mass         Malnutrition Interventions: Resume TF    Physical Assessment (per RN):  RUE Edema: Pitting 1+  LUE Edema: Pitting 1+  Pressure Injury: None noted  Comment: 400-729ml stool via flexi    Nutrition Diagnosis:  Inadequate oral intake  Etiology: current condition  Signs & Symptoms: intubated, need for TF to meet est needs    Intervention / Plan:  Updated kcal/pro goals.  Modified EN regimen in conversation with primary team.  Will monitor EN intakes for tolerance, adequacy, appropriateness & will adjust nutrition recs prn.    Burnard Sharps, RD, LD, CNSC   Available on [place] - Office: [phone]

## 2023-12-05 NOTE — Progress Notes
 Critical Care   Progress Note     Kevin Obrien  Today's Date:  12/05/2023  Admission Date: 12/01/2023  LOS: 4 days    Principal Problem:    Acute respiratory failure (CMS-HCC)  Active Problems:    Essential hypertension    Dyslipidemia    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)    Ischemic stroke (CMS-HCC)    Acute encephalopathy    On mechanically assisted ventilation (CMS-HCC)    Aspiration pneumonia (CMS-HCC)    NSTEMI (non-ST elevated myocardial infarction) (CMS-HCC)    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 (CMS-HCC)    Anemia    History of gastrointestinal diverticular hemorrhage    Acute kidney injury    Elevated liver enzymes    Coronary artery disease    History of coronary artery stent placement    Diarrhea    Elevated creatine kinase level    Lactic acidosis    Sepsis, unspecified organism (CMS-HCC)    Elevated brain natriuretic peptide (BNP) level    Prolonged QT interval        Assessment/Plan:      Hospital Course: Kevin Obrien is a 73 y.o. 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 Several recent admissions since May 2025; most recently admitted 6/12 - 6/20 for GIB; dc'd to SNF. Presented to ED for AMS. GCS 3 so intubated for airway protection. Initial labs significant for elevated troponin, AKI, & hyperglycemia. CT head revealed new large bilateral subacute occipitotemporal & moderate R basal ganglia infarcts. CT chest c/f pneumonia. Given IVF & abx. Admit to MICU.     Neurology following & assisting w/ stroke management. All sedation off since 6/23 w/ no significant clinical improvement. Infectious work-up thus far unremarkable; remains on abx.      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  Cerebral Atherosclerosis  - Likely multifactorial d/t new strokes, metabolic derangements in the setting of AKI, & infection  - Suspect new strokes likely d/t ICAD w/ cerebral hypoperfusion etiology  - PTA Plavix  75mg  daily, ASA 81mg  daily, melatonin 5mg , Remeron  15mg  daily HS, Modafinil  200mg  daily AM then 100mg   - Admit May 2025 w/ acute R pontomedullary brainstem infarct; planned for 3 months DAPT followed by aspirin  monotherapy  - Baseline non-verbal post CVA 5/9 w/ L side deficits (decreased ROM/attempting fine motor), assisted ambulation; more recently required 1-2 assist and predominantly bed bound in last week   - GCS 3 on ED arrival -- intubated for airway protection  - UDS & alcohol negative 6/22  - CT head 6/22: Development of large subacute appearing bilateral occipitotemporal & moderate sized right corona radiata/capsular/basal ganglia infarcts. No e/o hemorrhage conversion, midline shift, or herniation. Stable chronic pontomedullary infarcts, basal ganglia capsular infarct, lacunar infarcts.  - MRI brain 6/23: Development of large bilateral acute-early subacute infarcts involving the right occipitotemporal region & left temporoparietal occipital region. Large areas of acute/early subacute infarct involving the bilateral corona radiata & centrum semiovale w/ scattered puntatate cortical infarcts. No herniation or hydrocephalus. Findings c/w superimposed microhemorrhage within b/l cerebral hemispheres & withiin areas of infarct. Additional acute-early subacute infarcts within the bilateral cerebral hemispheres, cerebral peduncles, & brainstem.  - MRA head 6/23: Persistent occlusion of the proximal right intracranial vertebral arterial. Persistent occlusion of the proximal  right intracranial vertebral artery. Multifocal intracranial arterial luminal stenoses, including the right paraclinoid internal carotid artery, right middle cerebral, and bilateral   posterior cerebral arteries, without large vessel occlusion.   - MRA neck 6/23: Severe stenosis of the right V1 segment. Mild multifocal luminal   irregularity the left cervical vertebral artery. Moderate stenosis of the mid left ICA with otherwise mild multifocal irregularity of the bilateral CCAs and cervical ICAs  - CT head 6/23 redemonstrates multifocal evolving recent cerebral infarcts   - EEG 6/23 c/w moderate-severe encephalopathy   - CT head 6/24 unchanged    - CT head 6/25 unchanged   - On exam obtunded & not following commands   PLAN  - Neurology following  - Q1 neurochecks  - Continue PTA plavix  & ASA w/ new infarcts  - Hold remaining PTA medications  Sedation for Mechanical Ventilation   - Propofol  off since 6/23     PULM  Acute Hypoxic Respiratory Failure  Aspiration PNA  Mechanically Ventilated  - C/f atelectasis vs aspiration PNA noted in previous admission 5/8-5/17  - CXR 6/20 prior to discharge to SNF w/ RLL opacities w/ c/f PNA  - New oxygen requirement of 3L at SNF on 6/21  - Intubated for airway protection on ED arrival   - CXR 6/22: Patchy right basilar opacities, likely pneumonia vs aspiration. Low lung volumes w/ streaky left basilar opacities, likely atelectasis.atelectasis, RLL consolidation c/f PNA  - CT chest 6/22: Bilateral lower lobe consolidations with additional scattered patchy nodular opacities consistent with bilateral lower lobe pneumonia.     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 Segmental WMA w/ hypokinesis of apex & anterior lateral wall. Grade II LV diastolic dysfunction. RV size & function normal. No valvular issues.   - Currently NE (0.02)  - HR 90s  PLAN  - NE; SBP >120  - Continue PTA ASA 81mg  daily as above  - Hold PTA medications     GI  Dysphagia s/p PEG  Recent GIB  Mild Transaminitis (improving)  - Admit 6/12-6/20 w/ hematochezia; EGD unremarkable, colonoscopy w/ no active bleed. Bleeding ultimately resolved & resumed DAPT.  - CT abd/plvs 6/22: mild thickening of distal ileum suggesting nonspecific infectious or inflammatory enteritis. No significant ascites. Moderate diverticulosis coli, most prominent in the proximal sigmoid.   - Last BM 6/23  PLAN  - NPO; continue TFs   - Miralax  daily & senna BID   - PPI     RENAL  AKI  HAGMA  Lactic Acidosis (resolved)   Hypochloremia (resolved)   - Ddx pre-renal in the setting of reported diarrhea PTA vs ATN d/t sepsis  - Baseline Cr 0.8; on admit Cr 4.33  - FENa 1%, indeterminate   - CT a/p w/ no hydro or renal stones  - Cr  6.46, CO2 15, AG 17   - UO 1.7L   PLAN  - Strict I/Os  Elevated CPK   - CPK 2,533 --> 3,129 --> 971   - S/p 2L IVF in ED    ENDO  DMT2  Hyperglycemia  - PTA Aspart SSI, Lanus 20u daily   - Most recent A1c 5/8 - 11.7  - BHB 0.2  - Glucose > 500 on admit  PLAN  - Insulin  gtt (150 units/24hrs)      ID  PNA  Sepsis  UTI  Diarrhea  Hx E Coli UTI   - Prior E Coli UTI resistant to ampicillin & trimethsulfa; intermediate to Unasyn  - Reported diarrhea PTA  - BC 6/22 NGTD   - BC 6/23 NGTD   - UA 6/22 w/ negative nitrites, trace leuks, 20-50 WBCs; cx NGTD   - Fecal cx 6/22 pending  - Giardia 6/22 negative  - Shiga toxins 6/22 negative    - Cryptosporidium negative  - C Diff 6/22 negative  - MRSA & RVP 6/22 negative  - Sputum cx 6/24 NGTD   - Procal 6/22: 51  - WBC 12.4, afebrile  PLAN  - Continue zosyn  for PNA; 6/22-6/29     HEME  Chronic Anemia  - Hgb 7.6, plt 190  - Heparin  for VTE ppx     MSK/SKIN  DTI, sacrum/coccyx and L heel  - Wound team consult placed    Prophylaxis Review:  Lines: Yes PIV & ART line   Tubes: ETT/Flexi/G tube   Insulin : Yes  Urinary Catheter: Yes  VTE ppx: Heparin ; SCDs  GI ppx: PPI    Code status: FULL   Disposition: ICU    Pt discussed with Dr. Nada     - Pt critically ill with the above diagnoses. I spent 45 minutes providing critical care services including:  reviewing outside records and obtaining history from the patient/family members  performing a physical examination  serially reviewing laboratory, telemetry, hemodynamic, oximetry, and respiratory data  reviewing radiographic images  reviewing medications  managing fluids/electrolytes, antibiotics, ICU prophylaxis, and mechanical ventilation   developing the overall plan of care.    ROEL Solian, APRN   Pulmonary Critical Care   Pager [phone]   12/05/2023    M2 team pager (2nd call/nights) [phone]      __________________________________________________________________________________    Subjective:        Kevin Obrien is a 73 y.o. male who unable to obtain d/t above     Objective:        Medications:  Scheduled Meds:albuterol -ipratropium (DUONEB) nebulizer solution 3 mL, 3 mL, Inhalation, Q4H & PRN  aspirin  chewable tablet 81 mg, 81 mg, Per G Tube, QDAY  clopiDOGreL  (PLAVIX ) tablet 75 mg, 75 mg, Per G Tube, QDAY  EPINEPHrine (ADRENALIN) 4 mg in sodium chloride  0.9% (NS) 250 mL IV drip (std conc) (premade) (Cabinet Override), , , NOW  heparin  (porcine) PF syringe 5,000 Units, 5,000 Units, Subcutaneous, Q8H*  pantoprazole  (PROTONIX ) injection 40 mg, 40 mg, Intravenous, QDAY  phenylephrine  (NEO-SYNEPHRINE) 20 mg in sodium chloride  0.9% (NS) 250 mL IV drip (std conc) (premade) (Cabinet Override), , , NOW  piperacillin /tazobactam (ZOSYN ) 4.5 g in sodium chloride  0.9% (NS) 100 mL IVPB (MB+), 4.5 g, Intravenous, Q12H*  polyethylene glycol 3350  (MIRALAX ) packet 17 g, 1 packet, Per G Tube, QDAY  senna (SENOKOT) oral syrup 8.8 mg, 8.8 mg, Per G Tube, BID  thiamine  mononitrate (vit B1) tablet 100 mg, 100 mg, Per G Tube,  QDAY  VASOPRESSIN IN 0.9 % SOD CHLOR 20 UNIT/100 ML (0.2 UNIT/ML) IV SOLN (Cabinet Override), , , NOW    Continuous Infusions:   Diet Critical Care Enteral Feeding Volume Based Infusion 53 mL/hr at 12/05/23 0400    insulin  regular 100 units/NS 100 mL IV drip (premade) 7 Units/hr (12/05/23 0456)    norepinephrine  (LEVOPHED ) 4 mg in dextrose  5% (D5W) 250 mL IV drip (std conc) 0.02 mcg/kg/min (12/05/23 0158)     PRN and Respiratory Meds:acetaminophen  Q4H PRN, fentaNYL  citrate PF Q2H PRN, pancrelipase  20,880 Units/sodium bicarbonate  650 mg ([place] CLOG DESTROYER) PRN (On Call from Rx)                       Vital Signs: Last Filed                  Vital Signs: 24 Hour Range   Temp: 37.3 ?C (99.1 ?F) (06/26 0500)  Pulse: 90 (06/26 0500)  Respirations: 21 PER MINUTE (06/26 0500)  SpO2: 99 % (06/26 0500)  O2%: 30 % (06/26 0500)  O2 Device: Ventilator (06/26 0500) ABP: (109-151)/(36-51)   Temp:  [37.1 ?C (98.8 ?F)-38.3 ?C (100.9 ?F)]   Pulse:  [83-99]   Respirations:  [18 PER MINUTE-27 PER MINUTE]   SpO2:  [94 %-99 %]   O2%:  [30 %]   O2 Device: Ventilator     Vitals:    12/02/23 0314 12/02/23 0500 12/05/23 0206   Weight: 65.8 kg (145 lb) 65.8 kg (145 lb 1 oz) 68.5 kg (151 lb 0.2 oz)           Intake/Output Summary:  (Last 24 hours)    Intake/Output Summary (Last 24 hours) at 12/05/2023 0544  Last data filed at 12/05/2023 0500  Gross per 24 hour   Intake 2545.71 ml   Output 2180 ml   Net 365.71 ml         Physical Exam:         General: No distress, appears stated age  Lungs:  Clear upper & diminished lower bilaterally; no wheeze  Heart:  Regular rate and rhythm, S1, S2 normal, no murmur appreciated  Abdomen:  Soft, non-tender.  Bowel sounds normal  Neurologic: Obtunded      Artificial airway:  Endotracheal Tube                                                                                       Ventilator/ Respiratory Therapy:  Yes: Invasive Mode: MMV  Set VT (mL):  [400 milliliters]   Expired VT Spontaneous (mL): [553 milliliters-579 milliliters]   Expiratory VT (mL):  [553 mL-579 mL]   Set RR:  [18 breaths/minutes]   Total Respiratory Rate:  [19 breaths/minutes-20 breaths/minutes]   Minute Volume (Lpm):  [10.8 liters/minutes-11 liters/minutes]   %MVspontaneous:  [100 %]   PIP Actual (cmH2O):  [11 cm H20]   PEEP/CPAP (cmH2O):  [5 cm H2O]   PS (cmH2O):  [5 cm H20]   Mean Airway Pressure (cmH2O):  [6 cm H2O-7 cm H2O]      Vent weaning trial:  Not applicable    Laboratory:  LABS:  Recent Labs     12/02/23  0933 12/02/23  1548 12/02/23  2000 12/03/23  0355 12/03/23  1531 12/04/23  0301 12/04/23  1546 12/05/23  0211   NA 131* 134* 134* 134* 135* 138 136* 140   K 3.7 3.5 3.4* 3.3* 3.4* 3.4* 3.7 3.7   CL 98 100 100 100 103 105 105 108   CO2 17* 18* 17* 17* 15* 16* 16* 15*   GAP 16* 16* 17* 17* 17* 17* 15* 17*   BUN 75* 76* 77* 77* 79* 83* 90* 97*   CR 4.81* 5.04* 5.34* 5.53* 6.14* 6.22* 6.43* 6.46*   GLU 273* 163* 140* 170* 221* 135* 105* 135*   CA 7.4* 7.3* 7.9* 7.6* 7.9* 7.9* 8.1* 8.2*   ALBUMIN  --   --   --  2.6*  --  2.3*  --  2.6*   MG  --   --   --  1.9  --  2.0  --  2.3   PO4  --   --   --  3.5  --  3.2  --  5.1*       Recent Labs     12/03/23  0355 12/04/23  0301 12/05/23  0211   WBC 12.10* 9.60 12.40*   HGB 9.0* 7.8* 7.6*   HCT 24.7* 21.9* 22.1*   PLTCT 195 185 190   AST 70* 61* 71*   ALT 92* 90* 98*   ALKPHOS 82 85 108      Estimated Creatinine Clearance: 9.6 mL/min (A) (by C-G formula based on SCr of 6.46 mg/dL (H)).  Vitals:    12/02/23 0314 12/02/23 0500 12/05/23 0206   Weight: 65.8 kg (145 lb) 65.8 kg (145 lb 1 oz) 68.5 kg (151 lb 0.2 oz)      Recent Labs     12/04/23  0301 12/05/23  0211   PHART 7.43 7.39   PO2ART 152* 112*           Radiology and Other Diagnostic Procedures Review:    Reviewed     Malnutrition   Malnutrition present on admission  ICD-10 code E44: Acute illness/Moderate non-severe malnutrition  Mild loss of body fat, Mild loss of muscle mass            Loss of Subcutaneous Fat: Yes Mild Buccal  Muscle Wasting: Yes Mild Clavicle               Malnutrition Interventions: Resume TF

## 2023-12-05 NOTE — Progress Notes
 Adult Mechanical Ventilator Liberation    Name: Kevin Obrien   MRN: [id number]     DOB: 1951-02-19      Age: 73 y.o.  Admission Date: 12/01/2023     LOS: 4 days     Date of Service: 12/05/2023        Adult Mechanical Ventilator Liberation: Twice daily     Post Weaning Minute Volume (Lpm): 11.4 L/min  Post Weaning Respiratory Rate: 22 breaths/min  Post Weaning VT (mL)(calc): 518 mL  NIF Ventilated (cmH2O): -21 cm H2O  $$ Vital Capacity (mL): 0 ml  Post RSBI (calc): 42    PT remains on MMV mode of ventilation with PS 5/5. 100% Spontaneous breaths. Tolerating well. Pt did not follow commands.

## 2023-12-05 NOTE — Progress Notes
 Principal Problem:    Acute respiratory failure (CMS-HCC)  Active Problems:    Essential hypertension    Dyslipidemia    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)    Ischemic stroke (CMS-HCC)    Acute encephalopathy    On mechanically assisted ventilation (CMS-HCC)    Aspiration pneumonia (CMS-HCC)    NSTEMI (non-ST elevated myocardial infarction) (CMS-HCC)    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 (CMS-HCC)    Anemia    History of gastrointestinal diverticular hemorrhage    Acute kidney injury    Elevated liver enzymes    Coronary artery disease    History of coronary artery stent placement    Diarrhea    Elevated creatine kinase level    Lactic acidosis    Sepsis, unspecified organism (CMS-HCC)    Elevated brain natriuretic peptide (BNP) level    Prolonged QT interval     Kevin Obrien is a 73 y.o. male with PMH of recent Acute R pontomedullary infarct, CAD s/p PCI, hx GI bleed, DM2 admitted from SNF on 6/22 with acute/subacute large right occipitotemporal and left temporoparietal occipital stroke, aspiration pneumonia, AKI, and septic shock.  Remains on vent support needing airway protection.   Continue ASA and plavix .  Follow up CT head yesterday remains stable. Remains unresponsive off sedation.   Continue zosyn  for pneumonia.   BG control with insulin  drip.   Wean norepi as able today.   Renal failure likely secondary to sepsis. Renal function stable today.  Decent urine output. No urgent indication for dialysis.   Discussions regarding goals of care ongoing. Appreciate palliative care and neurology team support.     The patient is critically ill with problems listed above. I spent 40 mins time in addition to time spent by the NP performing critical care services including medication and record review, abx review, vasopressor mngt, fluid and electrolyte mngt, vent mngt, serial assessment and coordination of care.     [name], MD

## 2023-12-05 NOTE — Progress Notes
 Neurology Consult Note  Name: Catalino Plascencia   MRN: [id number]     DOB: January 16, 1951      Age: 73 y.o.  Admission Date: 12/01/2023     LOS: 4 days     Date of Service: 12/05/2023       Reason for Consult:      New bilateral subacute CVAs, recent pontomedullary CVA     Consult type: Co-Management w/Signed Orders    Assessment: Kevin Obrien is a 73 y.o.  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    Assessment & Plan  Acute respiratory failure (CMS-HCC)    Essential hypertension    Dyslipidemia    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)    Ischemic stroke (CMS-HCC)    Acute encephalopathy    On mechanically assisted ventilation (CMS-HCC)    Aspiration pneumonia (CMS-HCC)    NSTEMI (non-ST elevated myocardial infarction) (CMS-HCC)    Anemia    Acute kidney injury    Elevated liver enzymes    Sepsis, unspecified organism (CMS-HCC)    Elevated brain natriuretic peptide (BNP) level    Prolonged QT interval      Present on Admission:   Acute respiratory failure (CMS-HCC)   Ischemic stroke (CMS-HCC)   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)   Essential hypertension   Dyslipidemia    Neuro exam: GCS 3, intubated pinpoint pupils sluggishly reactive, gag reflex intact, corneal reflex absent.  No spontaneous movements or grimace to noxious stimulation.  Diffuse hyperreflexia with triple flexion bilaterally.    Neuroimaging:    CT Head 06/25: Redemonstration of multifocal evolving recent cerebral infarcts without gross hemorrhagic conversion, midline shift, or herniation. The smaller cerebral, brainstem, and cerebellar infarcts are better demonstrated on comparison MRI. Stable mild generalized cerebral volume loss and moderate nonspecific white matter disease, likely due to chronic macrovascular ischemia, with several small superimposed chronic infarcts.    CT head 06/24: Redemonstration of multifocal evolving recent cerebral infarcts without gross hemorrhagic conversion. The smaller cerebral, brainstem, and cerebellar infarcts are better demonstrated on comparison MRI. Stable mild generalized cerebral volume loss and moderate nonspecific white matter disease, likely due to chronic macrovascular ischemia, with several small superimposed chronic infarcts.    CT Head 06/23: Redemonstration of multifocal evolving recent cerebral infarcts without gross hemorrhagic conversion. Posterior fossa and brainstem infarcts are seen to better advantage on MRI modality. Stable mild generalized cerebral volume loss and moderate nonspecific white matter disease, most likely due to chronic macrovascular ischemia, with several small superimposed chronic infarcts.     CT head 06/22: Development of large subacute appearing bilateral occipitotemporal and moderate sized right corona radiata/capsular/basal ganglia infarcts. No evidence of hemorrhagic conversion. No midline shift or herniation.  Stable chronic pontomedullary infarcts, left basal gallbladder-capsular infarct, and old lacunar infarcts, better demonstrated on prior MRI.       Impression: Patient with a recent pontomedullary stroke and extensive ICAD who presents with stepwise, slowly progressive encephalopathy over the last week in the context of acute blood loss anemia and SIRS/sepsis.  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 Repeated CT head are stable without  any hemorrhagic conversion nor malignant edema. Palliative team has been involved for GOC discussion.    Recommendations  - No need to repeat CT head since all of them have been stable so far  - Can lower SBP goal to 100  - Can change neurochecks to q4h  - Rest of care per ICU  - Continue DAPT with Aspirin  and Clopidogrel  for a total of 90 days if patient was noncompliant with them in the past; if he was compliant, would continue indefinitely  - Family working with Palliative to determine goals of care      Thank you for allowing us  to participate in the care of your patient. Please contact Neurology team 1 on [facility] with any questions or concerns.    Aidan Alemifar MS3      ATTESTATION    I personally performed or re-performed the history, physical exam and treatment plan for the E/M. I discussed the case with the Medical Student, and concur with the Medical Student documentation of history, physical exam and treatment plan unless otherwise noted.    Resident name:  Marge Arlington, MD Date:  12/05/2023      ______________________________________________________________________________________________  Subjective    Patient's exam was overall comparable to yesterday's.    Medical/Surgical/Social/Family History{  Past Medical History:    Accidental fall    Angina pectoris    Arthritis    BPH (benign prostatic hyperplasia)    Cataract    Constipation    Coronary artery disease    DM (diabetes mellitus) (CMS-HCC)    ED (erectile dysfunction)    Heart attack (CMS-HCC)    Hyperlipidemia    Hypertension    Joint pain    Neuropathy    Shingles     Surgical History:   Procedure Laterality Date    CORONARY STENT PLACEMENT  2016    HX CHOLECYSTECTOMY  09/2015    COLONOSCOPY N/A 04/20/2016    Performed by Rogers Kipper, MD at [facility] ENDO    ESOPHAGOGASTRODUODENOSCOPY N/A 04/20/2016    Performed by Rogers Kipper, MD at [facility] END[facility]    ESOPHAGOGASTRODUODENOSCOPY BIOPSY  04/20/2016    Performed by Rogers Kipper, MD at [facility] ENDO    UREA BREATH TEST N/A 11/13/2016    Performed by Ledora Catalina, MD at [facility] ENDO    ESOPHAGOGASTRODUODENOSCOPY WITH BIOPSY - FLEXIBLE N/A 11/22/2023    Performed by Bonnetta Camellia BROCKS, MD at [facility] ENDO    COLONOSCOPY WITH BIOPSY - FLEXIBLE N/A 11/22/2023    Performed by Bonnetta Camellia BROCKS, MD at [facility] ENDO     Social History     Tobacco Use    Smoking status: Former     Current packs/day: 0.00     Average packs/day: 2.0 packs/day for 10.0 years (20.0 ttl pk-yrs)     Types: Cigarettes     Start date: 04/11/1961     Quit date: 04/12/1971     Years since quitting: 52.6    Smokeless tobacco: Never    Tobacco comments:     Former smoker/quit years ago   Vaping Use    Vaping status: Never Used   Substance and Sexual Activity    Alcohol use: Not Currently    Drug use: Never    Sexual activity: Not Currently     Partners: Female     Birth control/protection: None           Family History   Problem Relation Name Age of Onset    Diabetes Mother [facility]  CUBIAS     Diabetes Father JOSE Dupree     Cancer-Lung Father JOSE Madero     Melanoma Neg Hx      Cataract Neg Hx      Glaucoma Neg Hx      Macular Degen Neg Hx       Allergies: Seasonal allergies  Current Medications    Medication Directions   acetaminophen  (TYLENOL ) 325 mg tablet Take two tablets via feeding tube every 6 hours as needed for Pain. Indications: pain  Patient taking differently: two tablets by Per G Tube route every 6 hours as needed for Pain. Indications: pain   amLODIPine  (NORVASC ) 5 mg tablet one tablet by Per NG tube route daily. Indications: high blood pressure  Patient taking differently: one tablet by Per G Tube route daily. Indications: high blood pressure   amoxicillin /K clavulanate (AUGMENTIN ) 400 mg/5 mL oral suspension 10.9 mL by Per NG tube route twice daily for 7 days. Take with food.  Indications: a lower respiratory infection  Patient taking differently: 875 mg by Per G Tube route twice daily. Take with food.  Indications: a lower respiratory infection   artificial tears (PF) single dose ophthalmic solution Apply two drops to both eyes three times daily. Indications: dry eye aspirin  81 mg chewable tablet one tablet by Per NG tube route daily. Indications: stroke prevention  Patient taking differently: one tablet by Per G Tube route daily. Indications: stroke prevention   clopiDOGreL  (PLAVIX ) 75 mg tablet one tablet by Per NG tube route daily. Indications: prevention for a blood clot going to the brain  Patient taking differently: one tablet by Per G Tube route daily. Indications: prevention for a blood clot going to the brain   dextrose  50% (D50) syringe Administer 25-50 mL through vein as Needed (=< 70 mg/dL: See admin instructions). Indications: hypoglycemia   diclofenac  sodium (VOLTAREN ) 1 % topical gel Apply two g topically to affected area four times daily. Indications: pain   doxazosin  (CARDURA ) 4 mg tablet Take one tablet via feeding tube at bedtime daily. Indications: enlarged prostate with urination problem  Patient taking differently: one tablet by Per G Tube route at bedtime daily. Indications: enlarged prostate with urination problem   insulin  aspart (U-100) (NOVOLOG  FLEXPEN U-100 INSULIN ) 100 unit/mL (3 mL) PEN Inject twelve Units under the skin four times daily. PLUS Correction Factor if: BS 181-220=2 units, BS 221-260=4 units, BS 261-300=6 units, BS 301-350=8 units, BS 351-400=10 units, BS >400=12 units  Indications: type 2 diabetes mellitus   insulin  glargine (LANTUS  SOLOSTAR U-100 INSULIN ) 100 unit/mL (3 mL) subcutaneous PEN Inject twenty Units under the skin daily. Indications: type 2 diabetes mellitus  Patient taking differently: Inject twenty Units under the skin at bedtime daily. Indications: type 2 diabetes mellitus   Lactose-Free Food with Fiber (JEVITY 1.5 CAL) 0.06 gram-1.5 kcal/mL liqd Take 445 mL by mouth four times daily.   lidocaine  (LIDODERM ) 5 % topical patch Apply one patch topically to affected area daily. Apply patch for 12 hours, then remove for 12 hours before repeating.  Indications: neuropathic pain   lidocaine  hcl (XYLOCAINE ) 2 % mucosal jelly Apply  topically to affected area as Needed. Indications: local anesthesia for urethral pain  Patient taking differently: Apply  topically to affected area every 24 hours as needed. Indications: local anesthesia for urethral pain   melatonin 5 mg tablet Take one tablet via feeding tube at bedtime daily. Indications: difficulty sleeping  Patient taking differently: one tablet by Per G Tube route  at bedtime daily. Indications: difficulty sleeping   mirtazapine  (REMERON ) 15 mg tablet Take one tablet via feeding tube at bedtime daily. Indications: major depressive disorder  Patient taking differently: one tablet by Per G Tube route at bedtime daily. Indications: major depressive disorder   modafiniL  (PROVIGIL ) 100 mg tablet Take two tablets via feeding tube daily AND one tablet daily. Indications: post-stroke sleepiness   petrolatum  (STYE) 57.7-31.9 % oint ophthalmic ointment Apply one-quarter inch to both eyes three times daily. Indications: dry eye   phenoL (CHLORASEPTIC) 1.4 % mouth spray Take two sprays by mouth or throat as directed as Needed. Indications: irritation of the mouth  Patient taking differently: Take two sprays by mouth or throat as directed every 4 hours as needed. Indications: irritation of the mouth   propranoloL  (INDERAL ) 10 mg tablet one tablet by Per NG tube route twice daily. Indications: high blood pressure  Patient taking differently: one tablet by Per G Tube route twice daily. Indications: high blood pressure   rosuvastatin  (CRESTOR ) 40 mg tablet Take one tablet via feeding tube daily. Indications: excessive fat in the blood  Patient taking differently: one tablet by Per G Tube route at bedtime daily. Indications: excessive fat in the blood   sennosides-docusate sodium  (SENOKOT-S) 8.6/50 mg tablet one tablet by Per G Tube route daily as needed (Constipation - First Line). Indications: constipation   thiamine  mononitrate (vit B1) 100 mg tablet one tablet by Per NG tube route daily. Indications: deficiency in thiamine  or vitamin B1  Patient taking differently: one tablet by Per G Tube route daily. Indications: deficiency in thiamine  or vitamin B1   trazodone (#) (DESYREL ) 10 mg/mL Take 1.25ml (12.5 mg) twice daily as needed and 2.5ml (25 mg) nightly at bed time  Indications: PRN for agitation       Objective                        Vital Signs: Last Filed                 Vital Signs: 24 Hour Range   Temp: 37.4 ?C (99.3 ?F) (06/26 1200)  Pulse: 92 (06/26 1231)  Respirations: 24 PER MINUTE (06/26 1231)  SpO2: 97 % (06/26 1231)  O2%: 30 % (06/26 1231)  O2 Device: Ventilator (06/26 1200) ABP: (105-144)/(37-47)   Temp:  [37.1 ?C (98.8 ?F)-38.3 ?C (100.9 ?F)]   Pulse:  [88-99]   Respirations:  [18 PER MINUTE-25 PER MINUTE]   SpO2:  [94 %-99 %]   O2%:  [30 %]   O2 Device: Ventilator   CPOT Score Total: 0 (12/05/23 0800) Vitals:    12/02/23 0314 12/02/23 0500 12/05/23 0206   Weight: 65.8 kg (145 lb) 65.8 kg (145 lb 1 oz) 68.5 kg (151 lb 0.2 oz)         Intake/Output Summary:  (Last 24 hours)    Intake/Output Summary (Last 24 hours) at 12/05/2023 1302  Last data filed at 12/05/2023 1200  Gross per 24 hour   Intake 1841.96 ml   Output 2040 ml   Net -198.04 ml     Stool Occurrence: 1        Physical Exam  General physical exam:  HEENT: normocephalic  Chest: Intubated      Physical Exam:    Blood pressure 125/51, pulse 92, temperature 37.4 ?C (99.3 ?F), height 172.7 cm (5' 8), weight 68.5 kg (151 lb 0.2 oz), SpO2 97%.      Neuro:  Mental Status: GCS 3, not open eyes to noxious stimuli   Cranial Nerves:       - Pupil exam: Size: Pinpoint, sluggishly reactive    - EOM: No doll's eye                 - Corneal reflex: Minimally present    - Grimace/facial movement: absent     - Gag reflex: Absent    Motor:         RUE: Strength: 0/5                     RLE: Strength: 0/5            LUE: Strength: 0/5         LLE: Strength: 0/5   Sensory: No grimacing/withdrawal to noxious stimulation   Coordination: NA   DTRs: +3 all over, triple flexion bilaterally   Gait: NA      Lab/Radiology/Other Diagnostic Tests:  Results for orders placed or performed during the hospital encounter of 12/01/23 (from the past 24 hours)   POC GLUCOSE    Collection Time: 12/04/23  1:31 PM   Result Value Ref Range    Glucose, POC 84 70 - 100 mg/dL   POC GLUCOSE    Collection Time: 12/04/23  2:03 PM   Result Value Ref Range    Glucose, POC 85 70 - 100 mg/dL   POC GLUCOSE    Collection Time: 12/04/23  2:36 PM   Result Value Ref Range    Glucose, POC 91 70 - 100 mg/dL   POC GLUCOSE    Collection Time: 12/04/23  3:35 PM   Result Value Ref Range    Glucose, POC 105 (H) 70 - 100 mg/dL   BASIC METABOLIC PANEL    Collection Time: 12/04/23  3:46 PM   Result Value Ref Range    Sodium 136 (L) 137 - 147 mmol/L    Potassium 3.7 3.5 - 5.1 mmol/L    Chloride 105 98 - 110 mmol/L    Glucose 105 (H) 70 - 100 mg/dL    Blood Urea Nitrogen 90 (H) 7 - 25 mg/dL    Creatinine 3.56 (H) 0.40 - 1.24 mg/dL    Calcium 8.1 (L) 8.5 - 10.6 mg/dL    CO2 16 (L) 21 - 30 mmol/L    Anion Gap 15 (H) 3 - 12    Glomerular Filtration Rate (GFR) 8 (L) >60 mL/min   POC GLUCOSE    Collection Time: 12/04/23  4:40 PM   Result Value Ref Range    Glucose, POC 106 (H) 70 - 100 mg/dL   POC GLUCOSE    Collection Time: 12/04/23  5:43 PM   Result Value Ref Range    Glucose, POC 115 (H) 70 - 100 mg/dL   POC GLUCOSE    Collection Time: 12/04/23  6:41 PM   Result Value Ref Range    Glucose, POC 102 (H) 70 - 100 mg/dL   POC GLUCOSE    Collection Time: 12/04/23  8:02 PM   Result Value Ref Range    Glucose, POC 109 (H) 70 - 100 mg/dL   POC GLUCOSE    Collection Time: 12/04/23  8:54 PM   Result Value Ref Range    Glucose, POC 98 70 - 100 mg/dL   POC GLUCOSE    Collection Time: 12/04/23  9:59 PM   Result Value Ref Range    Glucose, POC 100 70 - 100  mg/dL   POC GLUCOSE    Collection Time: 12/04/23 11:00 PM   Result Value Ref Range    Glucose, POC 102 (H) 70 - 100 mg/dL   POC GLUCOSE    Collection Time: 12/04/23 11:58 PM   Result Value Ref Range    Glucose, POC 107 (H) 70 - 100 mg/dL   POC GLUCOSE    Collection Time: 12/05/23 12:58 AM   Result Value Ref Range    Glucose, POC 115 (H) 70 - 100 mg/dL   POC GLUCOSE    Collection Time: 12/05/23  2:04 AM   Result Value Ref Range    Glucose, POC 119 (H) 70 - 100 mg/dL   CBC AND DIFF    Collection Time: 12/05/23  2:11 AM   Result Value Ref Range    White Blood Cells 12.40 (H) 4.50 - 11.00 10*3/uL    Red Blood Cells 2.59 (L) 4.40 - 5.50 10*6/uL    Hemoglobin 7.6 (L) 13.5 - 16.5 g/dL    Hematocrit 77.8 (L) 40.0 - 50.0 %    MCV 85.4 80.0 - 100.0 fL    MCH 29.4 26.0 - 34.0 pg    MCHC 34.4 32.0 - 36.0 g/dL    RDW 86.0 88.9 - 84.9 %    Platelet Count 190 150 - 400 10*3/uL    MPV 9.0 7.0 - 11.0 fL    Neutrophils 80.8 (H) 41.0 - 77.0 %    Lymphocytes 8.6 (L) 24.0 - 44.0 %    Monocytes 9.0 4.0 - 12.0 %    Eosinophils 1.2 0.0 - 5.0 %    Basophils 0.4 0.0 - 2.0 %    Absolute Neutrophil Count 10.00 (H) 1.80 - 7.00 10*3/uL    Absolute Lymph Count 1.10 1.00 - 4.80 10*3/uL    Absolute Monocyte Count 1.10 (H) 0.00 - 0.80 10*3/uL    Absolute Eosinophil Count 0.10 0.00 - 0.45 10*3/uL    Absolute Basophil Count 0.10 0.00 - 0.20 10*3/uL    MDW (Monocyte Distribution Width) 23.8 (H) <=20.6   COMPREHENSIVE METABOLIC PANEL    Collection Time: 12/05/23  2:11 AM   Result Value Ref Range    Sodium 140 137 - 147 mmol/L    Potassium 3.7 3.5 - 5.1 mmol/L    Chloride 108 98 - 110 mmol/L    Glucose 135 (H) 70 - 100 mg/dL    Blood Urea Nitrogen 97 (H) 7 - 25 mg/dL    Creatinine 3.53 (H) 0.40 - 1.24 mg/dL    Calcium 8.2 (L) 8.5 - 10.6 mg/dL    Total Protein 5.6 (L) 6.0 - 8.0 g/dL    Total Bilirubin 0.3 0.2 - 1.3 mg/dL    Albumin 2.6 (L) 3.5 - 5.0 g/dL    Alk Phosphatase 891 25 - 110 U/L    AST 71 (H) 7 - 40 U/L    ALT 98 (H) 7 - 56 U/L    CO2 15 (L) 21 - 30 mmol/L    Anion Gap 17 (H) 3 - 12    Glomerular Filtration Rate (GFR) 8 (L) >60 mL/min   MAGNESIUM   CELLULAR THERAPEUTICS    Collection Time: 12/05/23  2:11 AM Result Value Ref Range    Magnesium  2.3 1.6 - 2.6 mg/dL   PHOSPHORUS  CELLULAR THERAPEUTICS    Collection Time: 12/05/23  2:11 AM   Result Value Ref Range    Phosphorus 5.1 (H) 2.0 - 4.5 mg/dL   BLOOD GASES, ARTERIAL    Collection  Time: 12/05/23  2:11 AM   Result Value Ref Range    pH 7.39 7.35 - 7.45    pCO2-Arterial 27 (L) 35 - 45 mmHg    pO2-Arterial 112 (H) 80 - 100 mmHg    Base Deficit-Arterial 8.1 mmol/L    O2 SAT-Arterial 98.6 95.0 - 99.0 %    Bicarbonate-ART-Cal 17.8 (L) 21.0 - 28.0 mmol/L    FiO2 Value Arterial 30 %   POC GLUCOSE    Collection Time: 12/05/23  2:50 AM   Result Value Ref Range    Glucose, POC 129 (H) 70 - 100 mg/dL   POC GLUCOSE    Collection Time: 12/05/23  4:52 AM   Result Value Ref Range    Glucose, POC 126 (H) 70 - 100 mg/dL   POC GLUCOSE    Collection Time: 12/05/23  6:50 AM   Result Value Ref Range    Glucose, POC 115 (H) 70 - 100 mg/dL   POC GLUCOSE    Collection Time: 12/05/23  8:36 AM   Result Value Ref Range    Glucose, POC 118 (H) 70 - 100 mg/dL   POC GLUCOSE    Collection Time: 12/05/23  9:19 AM   Result Value Ref Range    Glucose, POC 127 (H) 70 - 100 mg/dL   POC GLUCOSE    Collection Time: 12/05/23 12:30 PM   Result Value Ref Range    Glucose, POC 116 (H) 70 - 100 mg/dL             I personally reviewed the following MRI, CT   24-hour labs:    Results for orders placed or performed during the hospital encounter of 12/01/23 (from the past 24 hours)   POC GLUCOSE    Collection Time: 12/04/23  1:31 PM   Result Value Ref Range    Glucose, POC 84 70 - 100 mg/dL   POC GLUCOSE    Collection Time: 12/04/23  2:03 PM   Result Value Ref Range    Glucose, POC 85 70 - 100 mg/dL   POC GLUCOSE    Collection Time: 12/04/23  2:36 PM   Result Value Ref Range    Glucose, POC 91 70 - 100 mg/dL   POC GLUCOSE    Collection Time: 12/04/23  3:35 PM   Result Value Ref Range    Glucose, POC 105 (H) 70 - 100 mg/dL   BASIC METABOLIC PANEL    Collection Time: 12/04/23  3:46 PM   Result Value Ref Range Sodium 136 (L) 137 - 147 mmol/L    Potassium 3.7 3.5 - 5.1 mmol/L    Chloride 105 98 - 110 mmol/L    Glucose 105 (H) 70 - 100 mg/dL    Blood Urea Nitrogen 90 (H) 7 - 25 mg/dL    Creatinine 3.56 (H) 0.40 - 1.24 mg/dL    Calcium 8.1 (L) 8.5 - 10.6 mg/dL    CO2 16 (L) 21 - 30 mmol/L    Anion Gap 15 (H) 3 - 12    Glomerular Filtration Rate (GFR) 8 (L) >60 mL/min   POC GLUCOSE    Collection Time: 12/04/23  4:40 PM   Result Value Ref Range    Glucose, POC 106 (H) 70 - 100 mg/dL   POC GLUCOSE    Collection Time: 12/04/23  5:43 PM   Result Value Ref Range    Glucose, POC 115 (H) 70 - 100 mg/dL   POC GLUCOSE    Collection Time:  12/04/23  6:41 PM   Result Value Ref Range    Glucose, POC 102 (H) 70 - 100 mg/dL   POC GLUCOSE    Collection Time: 12/04/23  8:02 PM   Result Value Ref Range    Glucose, POC 109 (H) 70 - 100 mg/dL   POC GLUCOSE    Collection Time: 12/04/23  8:54 PM   Result Value Ref Range    Glucose, POC 98 70 - 100 mg/dL   POC GLUCOSE    Collection Time: 12/04/23  9:59 PM   Result Value Ref Range    Glucose, POC 100 70 - 100 mg/dL   POC GLUCOSE    Collection Time: 12/04/23 11:00 PM   Result Value Ref Range    Glucose, POC 102 (H) 70 - 100 mg/dL   POC GLUCOSE    Collection Time: 12/04/23 11:58 PM   Result Value Ref Range    Glucose, POC 107 (H) 70 - 100 mg/dL   POC GLUCOSE    Collection Time: 12/05/23 12:58 AM   Result Value Ref Range    Glucose, POC 115 (H) 70 - 100 mg/dL   POC GLUCOSE    Collection Time: 12/05/23  2:04 AM   Result Value Ref Range    Glucose, POC 119 (H) 70 - 100 mg/dL   CBC AND DIFF    Collection Time: 12/05/23  2:11 AM   Result Value Ref Range    White Blood Cells 12.40 (H) 4.50 - 11.00 10*3/uL    Red Blood Cells 2.59 (L) 4.40 - 5.50 10*6/uL    Hemoglobin 7.6 (L) 13.5 - 16.5 g/dL    Hematocrit 77.8 (L) 40.0 - 50.0 %    MCV 85.4 80.0 - 100.0 fL    MCH 29.4 26.0 - 34.0 pg    MCHC 34.4 32.0 - 36.0 g/dL    RDW 86.0 88.9 - 84.9 %    Platelet Count 190 150 - 400 10*3/uL    MPV 9.0 7.0 - 11.0 fL Neutrophils 80.8 (H) 41.0 - 77.0 %    Lymphocytes 8.6 (L) 24.0 - 44.0 %    Monocytes 9.0 4.0 - 12.0 %    Eosinophils 1.2 0.0 - 5.0 %    Basophils 0.4 0.0 - 2.0 %    Absolute Neutrophil Count 10.00 (H) 1.80 - 7.00 10*3/uL    Absolute Lymph Count 1.10 1.00 - 4.80 10*3/uL    Absolute Monocyte Count 1.10 (H) 0.00 - 0.80 10*3/uL    Absolute Eosinophil Count 0.10 0.00 - 0.45 10*3/uL    Absolute Basophil Count 0.10 0.00 - 0.20 10*3/uL    MDW (Monocyte Distribution Width) 23.8 (H) <=20.6   COMPREHENSIVE METABOLIC PANEL    Collection Time: 12/05/23  2:11 AM   Result Value Ref Range    Sodium 140 137 - 147 mmol/L    Potassium 3.7 3.5 - 5.1 mmol/L    Chloride 108 98 - 110 mmol/L    Glucose 135 (H) 70 - 100 mg/dL    Blood Urea Nitrogen 97 (H) 7 - 25 mg/dL    Creatinine 3.53 (H) 0.40 - 1.24 mg/dL    Calcium 8.2 (L) 8.5 - 10.6 mg/dL    Total Protein 5.6 (L) 6.0 - 8.0 g/dL    Total Bilirubin 0.3 0.2 - 1.3 mg/dL    Albumin 2.6 (L) 3.5 - 5.0 g/dL    Alk Phosphatase 891 25 - 110 U/L    AST 71 (H) 7 - 40 U/L    ALT  98 (H) 7 - 56 U/L    CO2 15 (L) 21 - 30 mmol/L    Anion Gap 17 (H) 3 - 12    Glomerular Filtration Rate (GFR) 8 (L) >60 mL/min   MAGNESIUM   CELLULAR THERAPEUTICS    Collection Time: 12/05/23  2:11 AM   Result Value Ref Range    Magnesium  2.3 1.6 - 2.6 mg/dL   PHOSPHORUS  CELLULAR THERAPEUTICS    Collection Time: 12/05/23  2:11 AM   Result Value Ref Range    Phosphorus 5.1 (H) 2.0 - 4.5 mg/dL   BLOOD GASES, ARTERIAL    Collection Time: 12/05/23  2:11 AM   Result Value Ref Range    pH 7.39 7.35 - 7.45    pCO2-Arterial 27 (L) 35 - 45 mmHg    pO2-Arterial 112 (H) 80 - 100 mmHg    Base Deficit-Arterial 8.1 mmol/L    O2 SAT-Arterial 98.6 95.0 - 99.0 %    Bicarbonate-ART-Cal 17.8 (L) 21.0 - 28.0 mmol/L    FiO2 Value Arterial 30 %   POC GLUCOSE    Collection Time: 12/05/23  2:50 AM   Result Value Ref Range    Glucose, POC 129 (H) 70 - 100 mg/dL   POC GLUCOSE    Collection Time: 12/05/23  4:52 AM   Result Value Ref Range    Glucose, POC 126 (H) 70 - 100 mg/dL   POC GLUCOSE    Collection Time: 12/05/23  6:50 AM   Result Value Ref Range    Glucose, POC 115 (H) 70 - 100 mg/dL   POC GLUCOSE    Collection Time: 12/05/23  8:36 AM   Result Value Ref Range    Glucose, POC 118 (H) 70 - 100 mg/dL   POC GLUCOSE    Collection Time: 12/05/23  9:19 AM   Result Value Ref Range    Glucose, POC 127 (H) 70 - 100 mg/dL   POC GLUCOSE    Collection Time: 12/05/23 12:30 PM   Result Value Ref Range    Glucose, POC 116 (H) 70 - 100 mg/dL

## 2023-12-05 NOTE — Progress Notes
 Chaplain Note:    Admit Date: 12/01/2023     Team confirmed that priest from [place]. Rumalda came and anointed. Chart updated to highlight sacramental care performed for [place]. Spiritual care remains available for Max, loved ones, and medical team.    The On-Call Chaplain is available on Voalte or can be paged via the switchboard [phone]) for urgent and emergent needs.   The Spiritual Care team responds to other requests within 24-hours when submitted as a Chaplain Consult in [facility].         Date/Time:                      User:                                      12/05/2023 6:01 AM Arleene Perch

## 2023-12-06 LAB — CULTURE-BLOOD W/SENSITIVITY

## 2023-12-06 LAB — POC GLUCOSE
~~LOC~~ BKR POC GLUCOSE: 118 mg/dL — ABNORMAL HIGH (ref 70–100)
~~LOC~~ BKR POC GLUCOSE: 126 mg/dL — ABNORMAL HIGH (ref 70–100)
~~LOC~~ BKR POC GLUCOSE: 115 mg/dL — ABNORMAL HIGH (ref 70–100)
~~LOC~~ BKR POC GLUCOSE: 116 mg/dL — ABNORMAL HIGH (ref 70–100)
~~LOC~~ BKR POC GLUCOSE: 110 mg/dL — ABNORMAL HIGH (ref 70–100)
~~LOC~~ BKR POC GLUCOSE: 105 mg/dL — ABNORMAL HIGH (ref 70–100)
~~LOC~~ BKR POC GLUCOSE: 207 mg/dL — ABNORMAL HIGH (ref 70–100)
~~LOC~~ BKR POC GLUCOSE: 280 mg/dL — ABNORMAL HIGH (ref 70–100)

## 2023-12-06 LAB — BLOOD GASES, ARTERIAL
~~LOC~~ BKR PCO2-ART: 25 mmHg — ABNORMAL LOW (ref 35–45)
~~LOC~~ BKR PH-ART: 7.4 10*3/uL — ABNORMAL LOW (ref 7.35–7.45)
~~LOC~~ BKR PO2-ART: 95 mmHg — ABNORMAL LOW (ref 80–100)

## 2023-12-06 LAB — COMPREHENSIVE METABOLIC PANEL: ~~LOC~~ BKR CHLORIDE: 108 mmol/L — ABNORMAL HIGH (ref 98–110)

## 2023-12-06 MED ORDER — DIAZEPAM 5 MG/ML IJ SYRG
2.5 mg | INTRAVENOUS | 0 refills | Status: DC | PRN
Start: 2023-12-06 — End: 2023-12-07
  Administered 2023-12-07 (×2): 2.5 mg via INTRAVENOUS

## 2023-12-06 MED ORDER — NPH INSULIN HUMAN RECOMB 100 UNIT/ML (3 ML) SC PEN
20 [IU] | SUBCUTANEOUS | 0 refills | Status: DC
Start: 2023-12-06 — End: 2023-12-07
  Administered 2023-12-06: 11:00:00 20 [IU] via SUBCUTANEOUS

## 2023-12-06 MED ORDER — HYDROMORPHONE (PF) 2 MG/ML IJ SYRG
.5 mg | INTRAVENOUS | 0 refills | Status: DC | PRN
Start: 2023-12-06 — End: 2023-12-07
  Administered 2023-12-07 (×4): 0.5 mg via INTRAVENOUS

## 2023-12-06 MED ORDER — INSULIN ASPART 100 UNIT/ML SC FLEXPEN
0-12 [IU] | SUBCUTANEOUS | 0 refills | Status: DC
Start: 2023-12-06 — End: 2023-12-07
  Administered 2023-12-06: 15:00:00 2 [IU] via SUBCUTANEOUS

## 2023-12-06 MED ORDER — GLYCOPYRROLATE 0.2 MG/ML IJ SOLN
.4 mg | INTRAVENOUS | 0 refills | Status: DC | PRN
Start: 2023-12-06 — End: 2023-12-07
  Administered 2023-12-07 (×3): 0.4 mg via INTRAVENOUS

## 2023-12-06 MED ORDER — HYDROMORPHONE (PF) 2 MG/ML IJ SYRG
.5 mg | Freq: Once | INTRAVENOUS | 0 refills | Status: CP
Start: 2023-12-06 — End: ?
  Administered 2023-12-07: 02:00:00 0.5 mg via INTRAVENOUS

## 2023-12-06 MED ORDER — LORAZEPAM 2 MG/ML IJ SOLN GROUP
2 mg | Freq: Once | INTRAVENOUS | 0 refills | Status: CP
Start: 2023-12-06 — End: ?
  Administered 2023-12-07: 02:00:00 2 mg via INTRAVENOUS

## 2023-12-06 MED ORDER — HALOPERIDOL LACTATE 5 MG/ML IJ SOLN
2 mg | INTRAVENOUS | 0 refills | Status: DC | PRN
Start: 2023-12-06 — End: 2023-12-07

## 2023-12-06 MED ORDER — DEXTROSE 50 % IN WATER (D50W) IV SYRG
12.5-25 g | INTRAVENOUS | 0 refills | Status: DC | PRN
Start: 2023-12-06 — End: 2023-12-07

## 2023-12-06 MED ORDER — LANSOPRAZOLE 30 MG PO TBLD
30 mg | Freq: Every day | GASTROSTOMY | 0 refills | Status: DC
Start: 2023-12-06 — End: 2023-12-07
  Administered 2023-12-06: 14:00:00 30 mg via GASTROSTOMY

## 2023-12-06 MED ORDER — GLYCOPYRROLATE 0.2 MG/ML IJ SOLN
.4 mg | Freq: Once | INTRAVENOUS | 0 refills | Status: CP
Start: 2023-12-06 — End: ?
  Administered 2023-12-07: 02:00:00 0.4 mg via INTRAVENOUS

## 2023-12-06 NOTE — Case Management (ED)
 Case Management Progress Note    NAME:Kevin Obrien                          MRN: [id number]              DOB:June 21, 1950          AGE: 73 y.o.  ADMISSION DATE: 12/01/2023             DAYS ADMITTED: LOS: 5 days      Today's Date: 12/06/2023    PLAN: Discharge planning ongoing    Expected Discharge Date: 12/09/2023   Is Patient Medically Stable: No, Please explain: continue ICU care  Are there Barriers to Discharge? no    INTERVENTION/DISPOSITION:  Discharge Planning   - Patient remains intubated, sedation discontinued on 6/23. Weaned off pressor on 6/26.   - Neuro following for new CVAs. Patient remains unresponsive while off sedation.    - Palliative care following to assist with GOC. Per primary team, patient's family to possibly make decisions over the weekend.   - CM to continue to follow for support and discharge planning.  Transportation              Does the Patient Need Case Management to Arrange Discharge Transport? (ex: facility, ambulance, wheelchair/stretcher, Medicaid, cab, other): Yes  Will the Patient Use Family Transport?: No  Support                 Info or Referral                 Positive SDOH Domains and Potential Barriers                   Medication Needs                 Estate manager/land agent                 Other                 Discharge Disposition                                                                                                                                                          Sharlett Silence, BSN, RN  Nurse Case Manager  Available on Voalte

## 2023-12-06 NOTE — Progress Notes
 Adult Mechanical Ventilator Liberation    Name: Kevin Obrien   MRN: [id number]     DOB: [birth year]      Age: 73 y.o.  Admission Date: 12/01/2023     LOS: 5 days     Date of Service: 12/06/2023        Adult Mechanical Ventilator Liberation: Twice daily     SAT Safety Screen (Nursing Only): Patient not receiving continuous sedation-Not applicable  Post Weaning Minute Volume (Lpm): 9.6 L/min  Post Weaning Respiratory Rate: 24 breaths/min  Post Weaning VT (mL)(calc): 400 mL  Post RSBI (calc): 60    Pt remains oon MMV with PS 5/5 . Tolerating well. Continous to have 100% spontaneous breaths.  Pt not following commands.

## 2023-12-06 NOTE — Progress Notes
 Neurology Consult Note  Name: Kevin Obrien   MRN: [id number]     DOB: [birth year]      Age: 73 y.o.  Admission Date: 12/01/2023     LOS: 5 days     Date of Service: 12/06/2023       Reason for Consult:      New bilateral subacute CVAs, recent pontomedullary CVA     Consult type: Co-Management w/Signed Orders    Assessment: Kevin Obrien is a 73 y.o.  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    Assessment & Plan  Acute respiratory failure (CMS-HCC)    Essential hypertension    Dyslipidemia    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)    Ischemic stroke (CMS-HCC)    Acute encephalopathy    On mechanically assisted ventilation (CMS-HCC)    Aspiration pneumonia (CMS-HCC)    NSTEMI (non-ST elevated myocardial infarction) (CMS-HCC)    Anemia    Acute kidney injury    Elevated liver enzymes    Sepsis, unspecified organism (CMS-HCC)    Elevated brain natriuretic peptide (BNP) level    Prolonged QT interval      Present on Admission:   Acute respiratory failure (CMS-HCC)   Ischemic stroke (CMS-HCC)   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)   Essential hypertension   Dyslipidemia    Neuro exam: GCS 3, intubated pinpoint pupils sluggishly reactive, gag reflex intact, corneal reflex absent.  No spontaneous movements or grimace to noxious stimulation.  Diffuse hyperreflexia with triple flexion bilaterally.    Neuroimaging:    CT Head 06/25: Redemonstration of multifocal evolving recent cerebral infarcts without gross hemorrhagic conversion, midline shift, or herniation. The smaller cerebral, brainstem, and cerebellar infarcts are better demonstrated on comparison MRI. Stable mild generalized cerebral volume loss and moderate nonspecific white matter disease, likely due to chronic macrovascular ischemia, with several small superimposed chronic infarcts.    CT head 06/24: Redemonstration of multifocal evolving recent cerebral infarcts without gross hemorrhagic conversion. The smaller cerebral, brainstem, and cerebellar infarcts are better demonstrated on comparison MRI. Stable mild generalized cerebral volume loss and moderate nonspecific white matter disease, likely due to chronic macrovascular ischemia, with several small superimposed chronic infarcts.    CT Head 06/23: Redemonstration of multifocal evolving recent cerebral infarcts without gross hemorrhagic conversion. Posterior fossa and brainstem infarcts are seen to better advantage on MRI modality. Stable mild generalized cerebral volume loss and moderate nonspecific white matter disease, most likely due to chronic macrovascular ischemia, with several small superimposed chronic infarcts.     CT head 06/22: Development of large subacute appearing bilateral occipitotemporal and moderate sized right corona radiata/capsular/basal ganglia infarcts. No evidence of hemorrhagic conversion. No midline shift or herniation.  Stable chronic pontomedullary infarcts, left basal gallbladder-capsular infarct, and old lacunar infarcts, better demonstrated on prior MRI.       Impression: Patient with a recent pontomedullary stroke and extensive ICAD who presents with stepwise, slowly progressive encephalopathy over the last week in the context of acute blood loss anemia and SIRS/sepsis.  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 Repeated CT head are stable without  any hemorrhagic conversion nor malignant edema. Palliative team has been involved for GOC discussion.    Recommendations  - No need to repeat CT head since all of them have been stable so far  - Neurochecks to q4h  - Rest of care per ICU  - Continue DAPT with Aspirin  and Clopidogrel  for a total of 90 days if patient was noncompliant with them in the past; if he was compliant, would continue indefinitely  - Family working with Palliative to determine goals of care      Neurology will continue to follow peripherally. If primary team and/or family wants to talk with us , we will be happy to see Kevin Obrien again.      Marge Arlington, M.D.  Neurology PGY-3 resident  Available on Voalte    Patient seen and discussed with Dr. Alto.   ______________________________________________________________________________________________  Subjective    Patient's exam was overall comparable to yesterday's.    Medical/Surgical/Social/Family History{  Past Medical History:    Accidental fall    Angina pectoris    Arthritis    BPH (benign prostatic hyperplasia)    Cataract    Constipation    Coronary artery disease    DM (diabetes mellitus) (CMS-HCC)    ED (erectile dysfunction)    Heart attack (CMS-HCC)    Hyperlipidemia    Hypertension    Joint pain    Neuropathy    Shingles     Surgical History:   Procedure Laterality Date    CORONARY STENT PLACEMENT  2016    HX CHOLECYSTECTOMY  09/2015    COLONOSCOPY N/A 04/20/2016    Performed by Rogers Kipper, MD at [facility] ENDO    ESOPHAGOGASTRODUODENOSCOPY N/A 04/20/2016    Performed by Rogers Kipper, MD at [facility] ENDO    ESOPHAGOGASTRODUODENOSCOPY BIOPSY  04/20/2016    Performed by Rogers Kipper, MD at [facility] ENDO    UREA BREATH TEST N/A 11/13/2016    Performed by Ledora Catalina, MD at [facility] ENDO    ESOPHAGOGASTRODUODENOSCOPY WITH BIOPSY - FLEXIBLE N/A 11/22/2023    Performed by Bonnetta Camellia BROCKS, MD at [facility] ENDO    COLONOSCOPY WITH BIOPSY - FLEXIBLE N/A 11/22/2023    Performed by Bonnetta Camellia BROCKS, MD at [facility] ENDO     Social History     Tobacco Use    Smoking status: Former     Current packs/day: 0.00     Average packs/day: 2.0 packs/day for 10.0 years (20.0 ttl pk-yrs)     Types: Cigarettes     Start date: 04/11/1961     Quit date: 04/12/1971     Years since quitting: 52.6    Smokeless tobacco: Never    Tobacco comments:     Former smoker/quit years ago   Vaping Use    Vaping status: Never Used   Substance and Sexual Activity    Alcohol use: Not Currently    Drug use: Never    Sexual activity: Not Currently     Partners: Female     Birth control/protection: None           Family History   Problem Relation Name Age of Onset    Diabetes Mother AMPARO CUBIAS     Diabetes Father JOSE Wivell     Cancer-Lung Father JOSE Carter     Melanoma Neg Hx      Cataract Neg Hx      Glaucoma Neg Hx      Macular Degen Neg Hx  Allergies: Seasonal allergies  Current Medications    Medication Directions   acetaminophen  (TYLENOL ) 325 mg tablet Take two tablets via feeding tube every 6 hours as needed for Pain. Indications: pain  Patient taking differently: two tablets by Per G Tube route every 6 hours as needed for Pain. Indications: pain   amLODIPine  (NORVASC ) 5 mg tablet one tablet by Per NG tube route daily. Indications: high blood pressure  Patient taking differently: one tablet by Per G Tube route daily. Indications: high blood pressure   amoxicillin /K clavulanate (AUGMENTIN ) 400 mg/5 mL oral suspension 10.9 mL by Per NG tube route twice daily for 7 days. Take with food.  Indications: a lower respiratory infection  Patient taking differently: 875 mg by Per G Tube route twice daily. Take with food.  Indications: a lower respiratory infection   artificial tears (PF) single dose ophthalmic solution Apply two drops to both eyes three times daily. Indications: dry eye   aspirin  81 mg chewable tablet one tablet by Per NG tube route daily. Indications: stroke prevention  Patient taking differently: one tablet by Per G Tube route daily. Indications: stroke prevention   clopiDOGreL  (PLAVIX ) 75 mg tablet one tablet by Per NG tube route daily. Indications: prevention for a blood clot going to the brain  Patient taking differently: one tablet by Per G Tube route daily. Indications: prevention for a blood clot going to the brain   dextrose  50% (D50) syringe Administer 25-50 mL through vein as Needed (=< 70 mg/dL: See admin instructions). Indications: hypoglycemia   diclofenac  sodium (VOLTAREN ) 1 % topical gel Apply two g topically to affected area four times daily. Indications: pain   doxazosin  (CARDURA ) 4 mg tablet Take one tablet via feeding tube at bedtime daily. Indications: enlarged prostate with urination problem  Patient taking differently: one tablet by Per G Tube route at bedtime daily. Indications: enlarged prostate with urination problem   insulin  aspart (U-100) (NOVOLOG  FLEXPEN U-100 INSULIN ) 100 unit/mL (3 mL) PEN Inject twelve Units under the skin four times daily. PLUS Correction Factor if: BS 181-220=2 units, BS 221-260=4 units, BS 261-300=6 units, BS 301-350=8 units, BS 351-400=10 units, BS >400=12 units  Indications: type 2 diabetes mellitus   insulin  glargine (LANTUS  SOLOSTAR U-100 INSULIN ) 100 unit/mL (3 mL) subcutaneous PEN Inject twenty Units under the skin daily. Indications: type 2 diabetes mellitus  Patient taking differently: Inject twenty Units under the skin at bedtime daily. Indications: type 2 diabetes mellitus   Lactose-Free Food with Fiber (JEVITY 1.5 CAL) 0.06 gram-1.5 kcal/mL liqd Take 445 mL by mouth four times daily.   lidocaine  (LIDODERM ) 5 % topical patch Apply one patch topically to affected area daily. Apply patch for 12 hours, then remove for 12 hours before repeating.  Indications: neuropathic pain   lidocaine  hcl (XYLOCAINE ) 2 % mucosal jelly Apply  topically to affected area as Needed. Indications: local anesthesia for urethral pain  Patient taking differently: Apply  topically to affected area every 24 hours as needed. Indications: local anesthesia for urethral pain   melatonin 5 mg tablet Take one tablet via feeding tube at bedtime daily. Indications: difficulty sleeping  Patient taking differently: one tablet by Per G Tube route at bedtime daily. Indications: difficulty sleeping   mirtazapine  (REMERON ) 15 mg tablet Take one tablet via feeding tube at bedtime daily. Indications: major depressive disorder  Patient taking differently: one tablet by Per G Tube route at bedtime daily. Indications: major depressive disorder   modafiniL  (PROVIGIL ) 100 mg tablet Take two  tablets via feeding tube daily AND one tablet daily. Indications: post-stroke sleepiness   petrolatum  (STYE) 57.7-31.9 % oint ophthalmic ointment Apply one-quarter inch to both eyes three times daily. Indications: dry eye   phenoL (CHLORASEPTIC) 1.4 % mouth spray Take two sprays by mouth or throat as directed as Needed. Indications: irritation of the mouth  Patient taking differently: Take two sprays by mouth or throat as directed every 4 hours as needed. Indications: irritation of the mouth   propranoloL  (INDERAL ) 10 mg tablet one tablet by Per NG tube route twice daily. Indications: high blood pressure  Patient taking differently: one tablet by Per G Tube route twice daily. Indications: high blood pressure   rosuvastatin  (CRESTOR ) 40 mg tablet Take one tablet via feeding tube daily. Indications: excessive fat in the blood  Patient taking differently: one tablet by Per G Tube route at bedtime daily. Indications: excessive fat in the blood   sennosides-docusate sodium  (SENOKOT-S) 8.6/50 mg tablet one tablet by Per G Tube route daily as needed (Constipation - First Line). Indications: constipation   thiamine  mononitrate (vit B1) 100 mg tablet one tablet by Per NG tube route daily. Indications: deficiency in thiamine  or vitamin B1  Patient taking differently: one tablet by Per G Tube route daily. Indications: deficiency in thiamine  or vitamin B1   trazodone (#) (DESYREL ) 10 mg/mL Take 1.25ml (12.5 mg) twice daily as needed and 2.5ml (25 mg) nightly at bed time  Indications: PRN for agitation       Objective                        Vital Signs: Last Filed                 Vital Signs: 24 Hour Range   Temp: 38.1 ?C (100.6 ?F) (06/27 1300)  Pulse: 109 (06/27 1300)  Respirations: 25 PER MINUTE (06/27 1300)  SpO2: 96 % (06/27 1300)  O2%: 30 % (06/27 1300)  O2 Device: Ventilator (06/27 1300) ABP: (107-156)/(40-63)   Temp:  [37 ?C (98.6 ?F)-38.1 ?C (100.6 ?F)]   Pulse:  [89-109]   Respirations:  [19 PER MINUTE-30 PER MINUTE]   SpO2:  [94 %-98 %]   O2%:  [30 %]   O2 Device: Ventilator   CPOT Score Total: 0 (12/06/23 1400) Vitals:    12/02/23 0500 12/05/23 0206 12/06/23 0500   Weight: 65.8 kg (145 lb 1 oz) 68.5 kg (151 lb 0.2 oz) 69.6 kg (153 lb 7 oz)         Intake/Output Summary:  (Last 24 hours)    Intake/Output Summary (Last 24 hours) at 12/06/2023 1324  Last data filed at 12/06/2023 1301  Gross per 24 hour   Intake 1131.19 ml   Output 1885 ml   Net -753.81 ml     Stool Occurrence: 1        Physical Exam  General physical exam:  HEENT: normocephalic  Chest: Intubated      Physical Exam:    Blood pressure 125/51, pulse 109, temperature (!) 38.1 ?C (100.6 ?F), height 172.7 cm (5' 8), weight 69.6 kg (153 lb 7 oz), SpO2 96%.      Neuro:   Mental Status: GCS 3, not open eyes to noxious stimuli   Cranial Nerves:       - Pupil exam: Size: Pinpoint, sluggishly reactive    - EOM: No doll's eye                 -  Corneal reflex: Minimally present    - Grimace/facial movement: absent     - Gag reflex: Absent    Motor:         RUE: Strength: 0/5                     RLE: Strength: 0/5            LUE: Strength: 0/5         LLE: Strength: 0/5   Sensory: No grimacing/withdrawal to noxious stimulation   Coordination: NA   DTRs: +3 all over, triple flexion bilaterally   Gait: NA      Lab/Radiology/Other Diagnostic Tests:  Results for orders placed or performed during the hospital encounter of 12/01/23 (from the past 24 hours)   POC GLUCOSE    Collection Time: 12/05/23  2:45 PM   Result Value Ref Range    Glucose, POC 105 (H) 70 - 100 mg/dL   POC GLUCOSE    Collection Time: 12/05/23  4:16 PM   Result Value Ref Range    Glucose, POC 116 (H) 70 - 100 mg/dL   POC GLUCOSE    Collection Time: 12/05/23  5:27 PM   Result Value Ref Range    Glucose, POC 105 (H) 70 - 100 mg/dL   POC GLUCOSE    Collection Time: 12/05/23  6:24 PM   Result Value Ref Range    Glucose, POC 125 (H) 70 - 100 mg/dL   POC GLUCOSE    Collection Time: 12/05/23  7:30 PM   Result Value Ref Range    Glucose, POC 118 (H) 70 - 100 mg/dL   POC GLUCOSE    Collection Time: 12/05/23  8:31 PM   Result Value Ref Range    Glucose, POC 126 (H) 70 - 100 mg/dL   POC GLUCOSE    Collection Time: 12/05/23  9:27 PM   Result Value Ref Range    Glucose, POC 112 (H) 70 - 100 mg/dL   POC GLUCOSE    Collection Time: 12/05/23 11:46 PM   Result Value Ref Range    Glucose, POC 115 (H) 70 - 100 mg/dL   MAGNESIUM   CELLULAR THERAPEUTICS    Collection Time: 12/06/23  2:06 AM   Result Value Ref Range    Magnesium  2.4 1.6 - 2.6 mg/dL   PHOSPHORUS  CELLULAR THERAPEUTICS    Collection Time: 12/06/23  2:06 AM   Result Value Ref Range    Phosphorus 5.9 (H) 2.0 - 4.5 mg/dL   BLOOD GASES, ARTERIAL    Collection Time: 12/06/23  2:06 AM   Result Value Ref Range    pH 7.40 7.35 - 7.45    pCO2-Arterial 25 (L) 35 - 45 mmHg    pO2-Arterial 95 80 - 100 mmHg    Base Deficit-Arterial 8.4 mmol/L    O2 SAT-Arterial 97.8 95.0 - 99.0 %    Bicarbonate-ART-Cal 17.7 (L) 21.0 - 28.0 mmol/L    FiO2 Value Arterial     COMPREHENSIVE METABOLIC PANEL    Collection Time: 12/06/23  2:06 AM   Result Value Ref Range    Sodium 143 137 - 147 mmol/L    Potassium 3.8 3.5 - 5.1 mmol/L    Chloride 108 98 - 110 mmol/L    Glucose 115 (H) 70 - 100 mg/dL    Blood Urea Nitrogen 112 (H) 7 - 25 mg/dL    Creatinine 3.12 (H) 0.40 - 1.24 mg/dL    Calcium 8.7 8.5 -  10.6 mg/dL    Total Protein 6.0 6.0 - 8.0 g/dL    Total Bilirubin 0.3 0.2 - 1.3 mg/dL    Albumin 2.7 (L) 3.5 - 5.0 g/dL    Alk Phosphatase 784 (H) 25 - 110 U/L    AST 48 (H) 7 - 40 U/L    ALT 75 (H) 7 - 56 U/L    CO2 15 (L) 21 - 30 mmol/L    Anion Gap 20 (H) 3 - 12    Glomerular Filtration Rate (GFR) 8 (L) >60 mL/min   CBC AND DIFF    Collection Time: 12/06/23  2:06 AM   Result Value Ref Range    White Blood Cells 12.70 (H) 4.50 - 11.00 10*3/uL    Red Blood Cells 2.67 (L) 4.40 - 5.50 10*6/uL    Hemoglobin 7.8 (L) 13.5 - 16.5 g/dL    Hematocrit 76.8 (L) 40.0 - 50.0 %    MCV 86.3 80.0 - 100.0 fL    MCH 29.3 26.0 - 34.0 pg    MCHC 33.9 32.0 - 36.0 g/dL    RDW 85.8 88.9 - 84.9 %    Platelet Count 258 150 - 400 10*3/uL    MPV 8.7 7.0 - 11.0 fL    Neutrophils 82.3 (H) 41.0 - 77.0 %    Lymphocytes 7.3 (L) 24.0 - 44.0 %    Monocytes 8.0 4.0 - 12.0 %    Eosinophils 1.9 0.0 - 5.0 %    Basophils 0.5 0.0 - 2.0 %    Absolute Neutrophil Count 10.50 (H) 1.80 - 7.00 10*3/uL    Absolute Lymph Count 0.90 (L) 1.00 - 4.80 10*3/uL    Absolute Monocyte Count 1.00 (H) 0.00 - 0.80 10*3/uL    Absolute Eosinophil Count 0.20 0.00 - 0.45 10*3/uL    Absolute Basophil Count 0.10 0.00 - 0.20 10*3/uL    MDW (Monocyte Distribution Width) 20.7 (H) <=20.6   POC GLUCOSE    Collection Time: 12/06/23  2:09 AM   Result Value Ref Range    Glucose, POC 116 (H) 70 - 100 mg/dL   POC GLUCOSE    Collection Time: 12/06/23  4:09 AM   Result Value Ref Range    Glucose, POC 110 (H) 70 - 100 mg/dL   POC GLUCOSE    Collection Time: 12/06/23  6:02 AM   Result Value Ref Range    Glucose, POC 105 (H) 70 - 100 mg/dL   POC GLUCOSE    Collection Time: 12/06/23  9:50 AM   Result Value Ref Range    Glucose, POC 207 (H) 70 - 100 mg/dL             I personally reviewed the following MRI, CT   24-hour labs:    Results for orders placed or performed during the hospital encounter of 12/01/23 (from the past 24 hours)   POC GLUCOSE    Collection Time: 12/05/23  2:45 PM   Result Value Ref Range    Glucose, POC 105 (H) 70 - 100 mg/dL   POC GLUCOSE    Collection Time: 12/05/23  4:16 PM   Result Value Ref Range    Glucose, POC 116 (H) 70 - 100 mg/dL   POC GLUCOSE    Collection Time: 12/05/23  5:27 PM   Result Value Ref Range    Glucose, POC 105 (H) 70 - 100 mg/dL   POC GLUCOSE    Collection Time: 12/05/23  6:24 PM   Result Value Ref Range  Glucose, POC 125 (H) 70 - 100 mg/dL   POC GLUCOSE    Collection Time: 12/05/23  7:30 PM   Result Value Ref Range    Glucose, POC 118 (H) 70 - 100 mg/dL   POC GLUCOSE    Collection Time: 12/05/23  8:31 PM   Result Value Ref Range    Glucose, POC 126 (H) 70 - 100 mg/dL   POC GLUCOSE    Collection Time: 12/05/23  9:27 PM   Result Value Ref Range    Glucose, POC 112 (H) 70 - 100 mg/dL   POC GLUCOSE    Collection Time: 12/05/23 11:46 PM   Result Value Ref Range    Glucose, POC 115 (H) 70 - 100 mg/dL   MAGNESIUM   CELLULAR THERAPEUTICS    Collection Time: 12/06/23  2:06 AM   Result Value Ref Range    Magnesium  2.4 1.6 - 2.6 mg/dL   PHOSPHORUS  CELLULAR THERAPEUTICS    Collection Time: 12/06/23  2:06 AM   Result Value Ref Range    Phosphorus 5.9 (H) 2.0 - 4.5 mg/dL   BLOOD GASES, ARTERIAL    Collection Time: 12/06/23  2:06 AM   Result Value Ref Range    pH 7.40 7.35 - 7.45    pCO2-Arterial 25 (L) 35 - 45 mmHg    pO2-Arterial 95 80 - 100 mmHg    Base Deficit-Arterial 8.4 mmol/L    O2 SAT-Arterial 97.8 95.0 - 99.0 %    Bicarbonate-ART-Cal 17.7 (L) 21.0 - 28.0 mmol/L    FiO2 Value Arterial     COMPREHENSIVE METABOLIC PANEL    Collection Time: 12/06/23  2:06 AM   Result Value Ref Range    Sodium 143 137 - 147 mmol/L    Potassium 3.8 3.5 - 5.1 mmol/L    Chloride 108 98 - 110 mmol/L    Glucose 115 (H) 70 - 100 mg/dL    Blood Urea Nitrogen 112 (H) 7 - 25 mg/dL    Creatinine 3.12 (H) 0.40 - 1.24 mg/dL    Calcium 8.7 8.5 - 89.3 mg/dL    Total Protein 6.0 6.0 - 8.0 g/dL    Total Bilirubin 0.3 0.2 - 1.3 mg/dL    Albumin 2.7 (L) 3.5 - 5.0 g/dL    Alk Phosphatase 784 (H) 25 - 110 U/L    AST 48 (H) 7 - 40 U/L    ALT 75 (H) 7 - 56 U/L    CO2 15 (L) 21 - 30 mmol/L    Anion Gap 20 (H) 3 - 12    Glomerular Filtration Rate (GFR) 8 (L) >60 mL/min   CBC AND DIFF    Collection Time: 12/06/23  2:06 AM   Result Value Ref Range    White Blood Cells 12.70 (H) 4.50 - 11.00 10*3/uL    Red Blood Cells 2.67 (L) 4.40 - 5.50 10*6/uL    Hemoglobin 7.8 (L) 13.5 - 16.5 g/dL    Hematocrit 76.8 (L) 40.0 - 50.0 %    MCV 86.3 80.0 - 100.0 fL    MCH 29.3 26.0 - 34.0 pg    MCHC 33.9 32.0 - 36.0 g/dL    RDW 85.8 88.9 - 84.9 %    Platelet Count 258 150 - 400 10*3/uL    MPV 8.7 7.0 - 11.0 fL    Neutrophils 82.3 (H) 41.0 - 77.0 %    Lymphocytes 7.3 (L) 24.0 - 44.0 %    Monocytes 8.0 4.0 - 12.0 %  Eosinophils 1.9 0.0 - 5.0 %    Basophils 0.5 0.0 - 2.0 %    Absolute Neutrophil Count 10.50 (H) 1.80 - 7.00 10*3/uL    Absolute Lymph Count 0.90 (L) 1.00 - 4.80 10*3/uL    Absolute Monocyte Count 1.00 (H) 0.00 - 0.80 10*3/uL    Absolute Eosinophil Count 0.20 0.00 - 0.45 10*3/uL    Absolute Basophil Count 0.10 0.00 - 0.20 10*3/uL    MDW (Monocyte Distribution Width) 20.7 (H) <=20.6   POC GLUCOSE    Collection Time: 12/06/23  2:09 AM   Result Value Ref Range    Glucose, POC 116 (H) 70 - 100 mg/dL   POC GLUCOSE    Collection Time: 12/06/23  4:09 AM   Result Value Ref Range    Glucose, POC 110 (H) 70 - 100 mg/dL   POC GLUCOSE    Collection Time: 12/06/23  6:02 AM   Result Value Ref Range    Glucose, POC 105 (H) 70 - 100 mg/dL   POC GLUCOSE    Collection Time: 12/06/23  9:50 AM   Result Value Ref Range    Glucose, POC 207 (H) 70 - 100 mg/dL

## 2023-12-06 NOTE — Progress Notes
 OCCUPATIONAL THERAPY  NOTE   Name: Kevin Obrien   MRN: [id number]     DOB: August 17, 1950      Age: 73 y.o.  Admission Date: 12/01/2023     LOS: 1 day     Date of Service: 12/02/2023      Based on chart review and discussion with bedside RN, pt remains medically inappropriate for skilled OT/PT intervention at this time. Pt intubated and sedated and unable to appropriately participate in functional mobility. OT/PT will continue to follow and provide intervention as pt able/appropriate.     Therapist: Lauraine Blas, OTR/L  Date: 12/06/2023

## 2023-12-06 NOTE — Progress Notes
 Patient palliatively extubated patient to RA at this time per provider order.

## 2023-12-06 NOTE — Progress Notes
 Principal Problem:    Acute respiratory failure (CMS-HCC)  Active Problems:    Essential hypertension    Dyslipidemia    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)    Ischemic stroke (CMS-HCC)    Acute encephalopathy    On mechanically assisted ventilation (CMS-HCC)    Aspiration pneumonia (CMS-HCC)    NSTEMI (non-ST elevated myocardial infarction) (CMS-HCC)    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 (CMS-HCC)    Anemia    History of gastrointestinal diverticular hemorrhage    Acute kidney injury    Elevated liver enzymes    Coronary artery disease    History of coronary artery stent placement    Diarrhea    Elevated creatine kinase level    Lactic acidosis    Sepsis, unspecified organism (CMS-HCC)    Elevated brain natriuretic peptide (BNP) level    Prolonged QT interval     Kevin Obrien is a 73 y.o. male with PMH of recent Acute R pontomedullary infarct, CAD s/p PCI, hx GI bleed, DM2 admitted from SNF on 6/22 with acute/subacute large right occipitotemporal and left temporoparietal occipital stroke, aspiration pneumonia, AKI, and septic shock.  Remains on vent support needing airway protection.   Continue ASA and plavix .  Remains unresponsive off sedation. Neuro exam unchanged.  Continue zosyn  for pneumonia.   BG control with insulin  drip.   Off norepi  Renal failure likely secondary to sepsis. Renal function stable today.  Decent urine output. No urgent indication for dialysis.   Discussions regarding goals of care ongoing. Anticipate family decision on goals in next few days. Appreciate palliative care and neurology team support.     The patient is critically ill with problems listed above. I spent 37 mins time in addition to time spent by the NP performing critical care services including medication and record review, abx review, vasopressor mngt, fluid and electrolyte mngt, vent mngt, serial assessment and coordination of care.     [name], MD

## 2023-12-06 NOTE — Progress Notes
 PALLIATIVE CARE INPATIENT NOTE     Name: Kevin Obrien            MRN: [id number]                DOB: 20-Jan-1951          Age: 73 y.o.  Admission Date: 12/01/2023             LOS: 5 days    ASSESSMENT/PLAN     Kevin Obrien is a 73 y.o. 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 Additionally he was found to have aspiration pna, AKI and septic shock. Palliative medicine is consulted to assist with goals of care.     #Acute/subacute R occipitotemporal and L temporoparietal occipital stroke  #Recent R pontomedullary stroke  #Recent GI bleed  #AKI  #Septic shock  #Aspiration pna  #Requiring mechanical ventilation for airway protection  #Goals of care    Discussion:    Palliative team (myself, Jana SW) met with patient family Santa, [name] Stain, [name]) and patient's close friend at bedside. Family states they are still in discussions about next steps in his care. They ask about comfort plan again so I shared details including comfort medications, palliative extubation process, and taking next day or so to see what his symptom needs are before deciding on setting for hospice care.     RECOMMENDATIONS:   DNR-FI - continue supportive care as family considers aggressive vs comfort path  Family able to state pt lives a very active life and did not like being dependent on others  We will continue to follow for ongoing goals conversations and support  If family elects to transition to CMO plan of care, recommend the following  Document discussion in ACP note  Utilize comfort order set  Stop tube feeds  Premedicate extubation with dilaudid  0.2mg  IV, diazepam  2.5mg  IV, glyco 0.4mg    Start dilaudid  0.2mg  q29m prn air hunger/pain - okay to titrate per comfort  Start diazepam  2.5mg  IV q2h prn anxiety  Start haldol  1mg  IV q2h prn restlessness/agitation  Start glyco prn secretions  Oral care  Once transition to CMO, we will eval for hospice support    Dr. Deatra is covering for us  over the weekend and we will ask her to touch base with primary team to see if there are needs. Please reach out to her if you have questions/concerns. Page [phone] with acute/urgent concerns. I will resume care on Monday       PALLIATIVE CARE PLANNING     Advance Care Planning:   Identified Health Care Decision Maker:  [name] are listed as emergency contact  DPOA - Would benefit from completion of DPOA - unable to obtain  TPOPP - Not discussed    PC Clinic - NA    Medication safety - NA    Disposition planning: TBD       SUBJECTIVE     CC/Reason for Visit:goals of care    Additional history from: ICU APRN, chart review, bedside RN, family    Interval Hx: pt is unresponsive in ICU on mechanical ventilation. Not on pressor. No overnight events charted    OBJECTIVE     Blood pressure 125/51, pulse 92, temperature 37.6 ?C (99.7 ?F), height 172.7 cm (5' 8), weight 69.6 kg (153 lb 7 oz), SpO2 97%.  Physical Exam  General: critically ill, unresponsive  HEENT: Eyes closed, ETT in place  CV: Regular rate  Pulm: Non-labored respirations, mechanically ventilated  Neuro: unresponsive, not on sedation      Lab Results:  CBC   Lab Results   Component Value Date/Time    WBC 12.70 (H) 12/06/2023 02:06 AM    HGB 7.8 (L) 12/06/2023 02:06 AM    PLTCT 258 12/06/2023 02:06 AM     Lab Results   Component Value Date/Time    NEUT 82.3 (H) 12/06/2023 02:06 AM    ANC 10.50 (H) 12/06/2023 02:06 AM      Chemistries   Lab Results   Component Value Date/Time    NA 143 12/06/2023 02:06 AM    K 3.8 12/06/2023 02:06 AM    BUN 112 (H) 12/06/2023 02:06 AM    CR 6.87 (H) 12/06/2023 02:06 AM    GLU 115 (H) 12/06/2023 02:06 AM     Lab Results   Component Value Date/Time    CA 8.7 12/06/2023 02:06 AM    PO4 5.9 (H) 12/06/2023 02:06 AM    ALBUMIN 2.7 (L) 12/06/2023 02:06 AM    TOTPROT 6.0 12/06/2023 02:06 AM    ALKPHOS 215 (H) 12/06/2023 02:06 AM    AST 48 (H) 12/06/2023 02:06 AM    ALT 75 (H) 12/06/2023 02:06 AM    TOTBILI 0.3 12/06/2023 02:06 AM    GFR 8 (L) 12/06/2023 02:06 AM    GFRAA >60 02/22/2019 06:28 PM        Other Pertinent Diagnostic Results:   CT Head 06/25  1.  Redemonstration of multifocal evolving recent cerebral infarcts   without gross hemorrhagic conversion, midline shift, or herniation. The   smaller cerebral, brainstem, and cerebellar infarcts are better   demonstrated on comparison MRI.   2.  Stable mild generalized cerebral volume loss and moderate nonspecific   white matter disease, likely due to chronic macrovascular ischemia, with   several small superimposed chronic infarcts.     Palliative Care Data  Patient Location at Time of Consultation: Hospital - ICU (includes MICU, SICU, TICU, CICU, Neuro ICU, PICU)  Primary Diagnosis: Neurology (includes Neuromuscular or non-dementia Neurodegenerative): Stroke - Ischemic/Embolic    High medical decision making due to the following:  1 acute or chronic illness that poses a threat to life or bodily function  Review of notes outside of my specialty, Review of each unique test, and Assessment requiring an independent historian and discussion of management or test interpretation with ICU APRN (physician(s) or other qualified health care professional outside of my specialty)  IV controlled substances

## 2023-12-06 NOTE — Progress Notes
 Critical Care   Progress Note     Kevin Obrien  Today's Date:  12/06/2023  Admission Date: 12/01/2023  LOS: 5 days    Principal Problem:    Acute respiratory failure (CMS-HCC)  Active Problems:    Essential hypertension    Dyslipidemia    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)    Ischemic stroke (CMS-HCC)    Acute encephalopathy    On mechanically assisted ventilation (CMS-HCC)    Aspiration pneumonia (CMS-HCC)    NSTEMI (non-ST elevated myocardial infarction) (CMS-HCC)    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 (CMS-HCC)    Anemia    History of gastrointestinal diverticular hemorrhage    Acute kidney injury    Elevated liver enzymes    Coronary artery disease    History of coronary artery stent placement    Diarrhea    Elevated creatine kinase level    Lactic acidosis    Sepsis, unspecified organism (CMS-HCC)    Elevated brain natriuretic peptide (BNP) level    Prolonged QT interval        Assessment/Plan:      Hospital Course: Kevin Obrien is a 73 y.o. 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 Several recent admissions since May 2025; most recently admitted 6/12 - 6/20 for GIB; dc'd to SNF. Presented to ED for AMS. GCS 3 so intubated for airway protection. Initial labs significant for elevated troponin, AKI, & hyperglycemia. CT head revealed new large bilateral subacute occipitotemporal & moderate R basal ganglia infarcts. CT chest c/f pneumonia. Given IVF & abx. Admit to MICU.     Neurology following & assisting w/ stroke management. All sedation off since 6/23 w/ no significant clinical improvement. Infectious work-up thus far unremarkable; remains on abx for PNA.      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  Cerebral Atherosclerosis  - Likely multifactorial d/t new strokes, metabolic derangements in the setting of AKI, & infection  - Suspect new strokes likely d/t ICAD w/ cerebral hypoperfusion etiology  - PTA Plavix  75mg  daily, ASA 81mg  daily, melatonin 5mg , Remeron  15mg  daily HS, Modafinil  200mg  daily AM then 100mg   - Admit May 2025 w/ acute R pontomedullary brainstem infarct; planned for 3 months DAPT followed by aspirin  monotherapy  - Baseline non-verbal post CVA 5/9 w/ L side deficits (decreased ROM/attempting fine motor), assisted ambulation; more recently required 1-2 assist and predominantly bed bound in last week   - GCS 3 on ED arrival -- intubated for airway protection  - UDS & alcohol negative 6/22  - CT head 6/22: Development of large subacute appearing bilateral occipitotemporal & moderate sized right corona radiata/capsular/basal ganglia infarcts. No e/o hemorrhage conversion, midline shift, or herniation. Stable chronic pontomedullary infarcts, basal ganglia capsular infarct, lacunar infarcts.  - MRI brain 6/23: Development of large bilateral acute-early subacute infarcts involving the right occipitotemporal region & left temporoparietal occipital region. Large areas of acute/early subacute infarct involving the bilateral corona radiata & centrum semiovale w/ scattered puntatate cortical infarcts. No herniation or hydrocephalus. Findings c/w superimposed microhemorrhage within b/l cerebral hemispheres & withiin areas of infarct. Additional acute-early subacute infarcts within the bilateral cerebral hemispheres, cerebral peduncles, & brainstem.  - MRA head 6/23: Persistent occlusion of the proximal right intracranial vertebral arterial. Persistent occlusion of  the proximal right intracranial vertebral artery. Multifocal intracranial arterial luminal stenoses, including the right paraclinoid internal carotid artery, right middle cerebral, and bilateral   posterior cerebral arteries, without large vessel occlusion.   - MRA neck 6/23: Severe stenosis of the right V1 segment. Mild multifocal luminal   irregularity the left cervical vertebral artery. Moderate stenosis of the mid left ICA with otherwise mild multifocal irregularity of the bilateral CCAs and cervical ICAs  - CT head 6/23 redemonstrates multifocal evolving recent cerebral infarcts   - EEG 6/23 c/w moderate-severe encephalopathy   - CT head 6/24 unchanged    - CT head 6/25 unchanged   - On exam obtunded & not following commands   PLAN  - Neurology following  - Q4 neurochecks  - Continue PTA plavix  & ASA w/ new infarcts  - Hold remaining PTA medications  Sedation for Mechanical Ventilation (resolved)  - Propofol  & fentanyl  off since 6/23     PULM  Acute Hypoxic Respiratory Failure  Aspiration PNA  Mechanically Ventilated  - C/f atelectasis vs aspiration PNA noted in previous admission 5/8-5/17  - CXR 6/20 prior to discharge to SNF w/ RLL opacities w/ c/f PNA  - New oxygen requirement of 3L at SNF on 6/21  - Intubated for airway protection on ED arrival   - CXR 6/22: Patchy right basilar opacities, likely pneumonia vs aspiration. Low lung volumes w/ streaky left basilar opacities, likely atelectasis.atelectasis, RLL consolidation c/f PNA  - CT chest 6/22: Bilateral lower lobe consolidations with additional scattered patchy nodular opacities consistent with bilateral lower lobe pneumonia.     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 Segmental WMA w/ hypokinesis of apex & anterior lateral wall. Grade II LV diastolic dysfunction. RV size & function normal. No valvular issues.   - NE off 6/26   - HR 100s & SBP 120s   PLAN  - SBP >100  - Continue PTA ASA as above   - Hold PTA medications     GI  Dysphagia s/p PEG  Recent GIB  Mild Transaminitis (improving)  - Admit 6/12-6/20 w/ hematochezia; EGD unremarkable, colonoscopy w/ no active bleed. Bleeding ultimately resolved & resumed DAPT.  - CT abd/plvs 6/22: mild thickening of distal ileum suggesting nonspecific infectious or inflammatory enteritis. No significant ascites. Moderate diverticulosis coli, most prominent in the proximal sigmoid.   - Flexi 325mls/24hr   PLAN  - NPO; continue TFs   - Miralax  daily & senna BID   - PPI     RENAL  AKI   HAGMA  Lactic Acidosis (resolved)   Hypochloremia (resolved)   - Ddx pre-renal in the setting of reported diarrhea PTA vs ATN d/t sepsis  - Baseline Cr 0.8; on admit Cr 4.33  - FENa 1%, indeterminate   - CT a/p w/ no hydro or renal stones  -  Cr 6.87, AG 20, CO2 15  - UO 1.7L   PLAN  - Strict I/Os  Elevated CPK (resolved)   - CPK peak 3,129     ENDO  DMT2  Hyperglycemia  - PTA Aspart SSI, Lanus 20u daily   - A1c 12/02/23: 8.7%  - BHB 0.2  - Glucose > 500 on admit s/p insulin  gtt   - Insulin  gtt off 6/27   PLAN  - NPH 20 units Q8 & MDCF Q4   - DC insulin  gtt (101 units/24hrs)      ID  PNA  Sepsis  UTI  Diarrhea  Hx E Coli UTI   - Prior E Coli UTI resistant to ampicillin & trimethsulfa; intermediate to Unasyn  - Reported diarrhea PTA  - BC 6/22 NGTD   - BC 6/23 NGTD   - UA 6/22 w/ negative nitrites, trace leuks, 20-50 WBCs; cx NGTD   - Fecal cx 6/22 negative   - Giardia 6/22 negative  - Shiga toxins 6/22 negative    - Cryptosporidium negative  - C Diff 6/22 negative  - MRSA & RVP 6/22 negative  - Sputum cx 6/24 NGTD   - Procal 6/22: 51  - WBC 12.7, afebrile  PLAN  - Continue zosyn  for PNA; 6/22-6/29     HEME  Chronic Anemia  - Hgb 7.8, plt 258  - Heparin  for VTE ppx     MSK/SKIN  DTI on Sacrum/coccyx & L heel  - Wound team consult placed    Prophylaxis Review:  Lines: Yes PIV & ART line   Tubes: ETT/Flexi/G tube   Insulin : Yes  Urinary Catheter: Yes  VTE ppx: Heparin ; SCDs  GI ppx: PPI    Code status: FULL   Disposition: ICU    Pt discussed with Dr. Nada     - Pt critically ill with the above diagnoses. I spent 45 minutes providing critical care services including:  reviewing outside records and obtaining history from the patient/family members  performing a physical examination  serially reviewing laboratory, telemetry, hemodynamic, oximetry, and respiratory data  reviewing radiographic images  reviewing medications  managing fluids/electrolytes, antibiotics, ICU prophylaxis, and mechanical ventilation   developing the overall plan of care.    ROEL Solian, APRN   Pulmonary Critical Care   Pager [phone]   12/06/2023    M2 team pager (2nd call/nights) [phone]      __________________________________________________________________________________    Subjective:        Kevin Obrien is a 73 y.o. male who unable to obtain d/t above     Objective:        Medications:  Scheduled Meds:albuterol -ipratropium (DUONEB) nebulizer solution 3 mL, 3 mL, Inhalation, Q4H & PRN  aspirin  chewable tablet 81 mg, 81 mg, Per G Tube, QDAY  clopiDOGreL  (PLAVIX ) tablet 75 mg, 75 mg, Per G Tube, QDAY  heparin  (porcine) PF syringe 5,000 Units, 5,000 Units, Subcutaneous, Q8H*  pantoprazole  (PROTONIX ) injection 40 mg, 40 mg, Intravenous, QDAY  piperacillin /tazobactam (ZOSYN ) 4.5 g in sodium chloride  0.9% (NS) 100 mL IVPB (MB+), 4.5 g, Intravenous, Q12H*  polyethylene glycol 3350  (MIRALAX ) packet 17 g, 1 packet, Per G Tube, QDAY  senna (SENOKOT) oral syrup 8.8 mg, 8.8 mg, Per G Tube, BID  thiamine  mononitrate (vit B1) tablet 100 mg, 100 mg, Per G Tube, QDAY    Continuous Infusions:   Diet Critical Care Enteral Feeding Volume Based Infusion 35 mL/hr at 12/05/23 1600    insulin  regular 100 units/NS 100 mL IV drip (  premade) 3.4 Units/hr (12/06/23 0411)    norepinephrine  (LEVOPHED ) 4 mg in dextrose  5% (D5W) 250 mL IV drip (std conc) Stopped (12/05/23 1215)     PRN and Respiratory Meds:acetaminophen  Q4H PRN, pancrelipase  20,880 Units/sodium bicarbonate  650 mg ([place] CLOG DESTROYER) PRN (On Call from Rx)                       Vital Signs: Last Filed                  Vital Signs: 24 Hour Range   Temp: 37.9 ?C (100.2 ?F) (06/27 0500)  Pulse: 105 (06/27 0502)  Respirations: 25 PER MINUTE (06/27 0502)  SpO2: 96 % (06/27 0502)  O2%: 30 % (06/27 0502)  O2 Device: Ventilator (06/27 0502) ABP: (105-156)/(37-53)   Temp:  [37 ?C (98.6 ?F)-37.9 ?C (100.2 ?F)]   Pulse:  [89-107]   Respirations:  [19 PER MINUTE-27 PER MINUTE]   SpO2:  [94 %-99 %]   O2%:  [30 %]   O2 Device: Ventilator     Vitals:    12/02/23 0314 12/02/23 0500 12/05/23 0206   Weight: 65.8 kg (145 lb) 65.8 kg (145 lb 1 oz) 68.5 kg (151 lb 0.2 oz)           Intake/Output Summary:  (Last 24 hours)    Intake/Output Summary (Last 24 hours) at 12/06/2023 0540  Last data filed at 12/06/2023 0400  Gross per 24 hour   Intake 1216.81 ml   Output 2140 ml   Net -923.19 ml         Physical Exam:         General: No distress, appears stated age  Lungs:  Clear upper & diminished lower bilaterally; no wheeze  Heart:  Regular rate and rhythm, S1, S2 normal, no murmur appreciated  Abdomen:  Soft, non-tender.  Bowel sounds normal  Neurologic: Obtunded      Artificial airway:  Endotracheal Tube                                                                                       Ventilator/ Respiratory Therapy:  Yes: Invasive Mode: MMV  Set VT (mL):  [400 milliliters]   Expired VT Spontaneous (mL):  [523 milliliters]   Expiratory VT (mL):  [523 mL]   Set RR:  [18 breaths/minutes]   Total Respiratory Rate:  [24 breaths/minutes]   Minute Volume (Lpm):  [11.6 liters/minutes]   %MVspontaneous:  [100 %]   PIP Actual (cmH2O):  [11 cm H20]   PEEP/CPAP (cmH2O):  [5 cm H2O]   PS (cmH2O):  [5 cm H20]   Mean Airway Pressure (cmH2O): [7 cm H2O]      Vent weaning trial:  Not applicable    Laboratory:  LABS:  Recent Labs     12/03/23  1531 12/04/23  0301 12/04/23  1546 12/05/23  0211 12/06/23  0206   NA 135* 138 136* 140 143   K 3.4* 3.4* 3.7 3.7 3.8   CL 103 105 105 108 108   CO2 15* 16* 16* 15* 15*   GAP 17* 17* 15* 17* 20*  BUN 79* 83* 90* 97* 112*   CR 6.14* 6.22* 6.43* 6.46* 6.87*   GLU 221* 135* 105* 135* 115*   CA 7.9* 7.9* 8.1* 8.2* 8.7   ALBUMIN  --  2.3*  --  2.6* 2.7*   MG  --  2.0  --  2.3 2.4   PO4  --  3.2  --  5.1* 5.9*       Recent Labs     12/04/23  0301 12/05/23  0211 12/06/23  0206   WBC 9.60 12.40* 12.70*   HGB 7.8* 7.6* 7.8*   HCT 21.9* 22.1* 23.1*   PLTCT 185 190 258   AST 61* 71* 48*   ALT 90* 98* 75*   ALKPHOS 85 108 215*      Estimated Creatinine Clearance: 9 mL/min (A) (by C-G formula based on SCr of 6.87 mg/dL (H)).  Vitals:    12/02/23 0314 12/02/23 0500 12/05/23 0206   Weight: 65.8 kg (145 lb) 65.8 kg (145 lb 1 oz) 68.5 kg (151 lb 0.2 oz)      Recent Labs     12/05/23  0211 12/06/23  0206   PHART 7.39 7.40   PO2ART 112* 95           Radiology and Other Diagnostic Procedures Review:    Reviewed     Malnutrition   Malnutrition present on admission  ICD-10 code E44: Acute illness/Moderate non-severe malnutrition  Mild loss of body fat, Mild loss of muscle mass            Loss of Subcutaneous Fat: Yes Mild Buccal  Muscle Wasting: Yes Mild Clavicle               Malnutrition Interventions: Resume TF

## 2023-12-07 ENCOUNTER — Inpatient Hospital Stay: Admit: 2023-12-01 | Discharge: 2023-12-10 | Disposition: E | Payer: MEDICARE

## 2023-12-07 DIAGNOSIS — E872 Acidosis, unspecified: Secondary | ICD-10-CM

## 2023-12-07 DIAGNOSIS — Z955 Presence of coronary angioplasty implant and graft: Secondary | ICD-10-CM

## 2023-12-07 DIAGNOSIS — D649 Anemia, unspecified: Secondary | ICD-10-CM

## 2023-12-07 DIAGNOSIS — R6521 Severe sepsis with septic shock: Secondary | ICD-10-CM

## 2023-12-07 DIAGNOSIS — Z79899 Other long term (current) drug therapy: Secondary | ICD-10-CM

## 2023-12-07 DIAGNOSIS — N400 Enlarged prostate without lower urinary tract symptoms: Secondary | ICD-10-CM

## 2023-12-07 DIAGNOSIS — R34 Anuria and oliguria: Secondary | ICD-10-CM

## 2023-12-07 DIAGNOSIS — I6501 Occlusion and stenosis of right vertebral artery: Secondary | ICD-10-CM

## 2023-12-07 DIAGNOSIS — Z9049 Acquired absence of other specified parts of digestive tract: Secondary | ICD-10-CM

## 2023-12-07 DIAGNOSIS — Z7902 Long term (current) use of antithrombotics/antiplatelets: Secondary | ICD-10-CM

## 2023-12-07 DIAGNOSIS — Z91199 Patient's noncompliance with other medical treatment and regimen due to unspecified reason: Secondary | ICD-10-CM

## 2023-12-07 DIAGNOSIS — R402432 Glasgow coma scale score 3-8, at arrival to emergency department: Secondary | ICD-10-CM

## 2023-12-07 DIAGNOSIS — Z833 Family history of diabetes mellitus: Secondary | ICD-10-CM

## 2023-12-07 DIAGNOSIS — Z794 Long term (current) use of insulin: Secondary | ICD-10-CM

## 2023-12-07 DIAGNOSIS — R9401 Abnormal electroencephalogram [EEG]: Secondary | ICD-10-CM

## 2023-12-07 DIAGNOSIS — E1165 Type 2 diabetes mellitus with hyperglycemia: Secondary | ICD-10-CM

## 2023-12-07 DIAGNOSIS — Q2112 Patent foramen ovale: Secondary | ICD-10-CM

## 2023-12-07 DIAGNOSIS — K529 Noninfective gastroenteritis and colitis, unspecified: Secondary | ICD-10-CM

## 2023-12-07 DIAGNOSIS — I110 Hypertensive heart disease with heart failure: Secondary | ICD-10-CM

## 2023-12-07 DIAGNOSIS — K5730 Diverticulosis of large intestine without perforation or abscess without bleeding: Secondary | ICD-10-CM

## 2023-12-07 DIAGNOSIS — G253 Myoclonus: Secondary | ICD-10-CM

## 2023-12-07 DIAGNOSIS — R1310 Dysphagia, unspecified: Secondary | ICD-10-CM

## 2023-12-07 DIAGNOSIS — E440 Moderate protein-calorie malnutrition: Secondary | ICD-10-CM

## 2023-12-07 DIAGNOSIS — Z931 Gastrostomy status: Secondary | ICD-10-CM

## 2023-12-07 DIAGNOSIS — N390 Urinary tract infection, site not specified: Secondary | ICD-10-CM

## 2023-12-07 DIAGNOSIS — E785 Hyperlipidemia, unspecified: Secondary | ICD-10-CM

## 2023-12-07 DIAGNOSIS — I6389 Other cerebral infarction: Secondary | ICD-10-CM

## 2023-12-07 DIAGNOSIS — J159 Unspecified bacterial pneumonia: Secondary | ICD-10-CM

## 2023-12-07 DIAGNOSIS — Z87440 Personal history of urinary (tract) infections: Secondary | ICD-10-CM

## 2023-12-07 DIAGNOSIS — I5041 Acute combined systolic (congestive) and diastolic (congestive) heart failure: Secondary | ICD-10-CM

## 2023-12-07 DIAGNOSIS — I21A1 Myocardial infarction type 2: Secondary | ICD-10-CM

## 2023-12-07 DIAGNOSIS — I252 Old myocardial infarction: Secondary | ICD-10-CM

## 2023-12-07 DIAGNOSIS — G936 Cerebral edema: Secondary | ICD-10-CM

## 2023-12-07 DIAGNOSIS — Z66 Do not resuscitate: Secondary | ICD-10-CM

## 2023-12-07 DIAGNOSIS — E878 Other disorders of electrolyte and fluid balance, not elsewhere classified: Secondary | ICD-10-CM

## 2023-12-07 DIAGNOSIS — J690 Pneumonitis due to inhalation of food and vomit: Secondary | ICD-10-CM

## 2023-12-07 DIAGNOSIS — B029 Zoster without complications: Secondary | ICD-10-CM

## 2023-12-07 DIAGNOSIS — I452 Bifascicular block: Secondary | ICD-10-CM

## 2023-12-07 DIAGNOSIS — Z515 Encounter for palliative care: Secondary | ICD-10-CM

## 2023-12-07 DIAGNOSIS — R7401 Elevation of levels of liver transaminase levels: Secondary | ICD-10-CM

## 2023-12-07 DIAGNOSIS — I2510 Atherosclerotic heart disease of native coronary artery without angina pectoris: Secondary | ICD-10-CM

## 2023-12-07 DIAGNOSIS — Z7982 Long term (current) use of aspirin: Secondary | ICD-10-CM

## 2023-12-07 DIAGNOSIS — Z87891 Personal history of nicotine dependence: Secondary | ICD-10-CM

## 2023-12-07 DIAGNOSIS — I6381 Other cerebral infarction due to occlusion or stenosis of small artery: Secondary | ICD-10-CM

## 2023-12-07 DIAGNOSIS — I672 Cerebral atherosclerosis: Secondary | ICD-10-CM

## 2023-12-07 DIAGNOSIS — Z7401 Bed confinement status: Secondary | ICD-10-CM

## 2023-12-07 LAB — CULTURE-BLOOD W/SENSITIVITY

## 2023-12-10 NOTE — Death Pronouncement
 Name: Damire Remedios   MRN: [id number]     DOB: April 19, 1951      Age: 73 y.o.  Admission Date: 12/01/2023     LOS: 6 days     Date of Service: Jan 03, 2024         This note communicates necessary documentation for date and time of patient's death and for physician completion of death certificate.     Date death confirmed: 2024/01/03  Time death confirmed: 0144     Assessment:  Patient noted by family/nurse to be not breathing/lacking vital signs.  Chauncey Alderman, APRN has done the following assessment and confirmed the patient to have no responsiveness, no heart tones detected after 60 seconds of auscultation, no apical pulse, no respirations, and absence of pupillary reflex.      Actions:  Service Attending Dr. Mark Hamblin was notified and order placed for ?discharge as deceased?      The following family or contacts: Jayme Cham, [name] Hanly, Dyan Creelman were at bedside at the time of patient's death.      Autopsy Questions:  Does the family wish an autopsy to be performed? No     Comments: NA     Attending physician responsible for completing/signing death certificate: Dr. Mark Hamblin

## 2023-12-10 NOTE — Deceased Discharge Summary
 Death Discharge Summary      Name: Kevin Obrien  Medical Record Number: [id number]        Account Number:  [id number]  Date Of Birth:  11-Dec-1950                         Age:  73 y.o.   Admit date:  12/01/2023                     Discharge date:  December 31, 2023      Discharge Attending:  Dr. 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)    Ischemic stroke (CMS-HCC)    Acute encephalopathy    On mechanically assisted ventilation (CMS-HCC)    Aspiration pneumonia (CMS-HCC)    NSTEMI (non-ST elevated myocardial infarction) (CMS-HCC)    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 (CMS-HCC)    Anemia    History of gastrointestinal diverticular hemorrhage    Acute kidney injury    Elevated liver enzymes    Coronary artery disease    History of coronary artery stent placement    Diarrhea    Elevated creatine kinase level    Lactic acidosis    Sepsis, unspecified organism (CMS-HCC)    Elevated brain natriuretic peptide (BNP) level    Prolonged QT interval     Present on Admission:   Acute respiratory failure (CMS-HCC)   Ischemic stroke (CMS-HCC)   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)   Essential hypertension   Dyslipidemia        Significant Past Medical History        Accidental fall  Angina pectoris  Arthritis  BPH (benign prostatic hyperplasia)  Cataract  Constipation  Coronary artery disease  DM (diabetes mellitus) (CMS-HCC)  ED (erectile dysfunction)  Heart attack (CMS-HCC)  Hyperlipidemia  Hypertension  Joint pain  Neuropathy  Shingles    Allergies   Seasonal allergies    Brief Hospital Course   The patient was admitted and the following issues were addressed during this hospitalization and a summary of events leading to death (with pertinent details including admission exam/imaging/labs):  [facility] is a 73 y.o. 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 Several recent admissions since May 2025; most recently admitted 6/12 - 6/20 for GIB; dc'd to SNF. Presented to ED for AMS. GCS 3 so intubated for airway protection. Initial labs significant for elevated troponin, AKI, & hyperglycemia. CT head revealed new large bilateral subacute occipitotemporal & moderate R basal ganglia infarcts. CT chest c/f pneumonia. Given IVF & abx.     Wound team consulted for DTIs. Neurology following & assisting w/ stroke management. All sedation off since 6/23 w/ no significant clinical improvement. Infectious work-up thus far unremarkable; remains on abx for PNA. Palliative Care consulted to assist w/ GOC discussion. Given lack of improvement of neuro exam, family discussed plans to transition to CMO and  focus on Kevin Obrien's comfort at the end of his life. Family at bedside at time of passing.    Date Death Pronounced: 12-26-2023  Time Death Pronounced: 1:44 AM    Consults, Procedures, Diagnostics, Micro, Pathology   Consults: noted in brief hospital course  Surgical Procedures & Dates: noted in brief hospital course  Significant Diagnostic Studies, Micro and Procedures: noted in brief hospital course  Significant Pathology: noted in brief hospital course  Nutrition:  Malnutrition present on admission  ICD-10 code E44: Acute illness/Moderate non-severe malnutrition  Mild loss of body fat, Mild loss of muscle mass            Loss of Subcutaneous Fat: Yes Mild Buccal  Muscle Wasting: Yes Mild Clavicle               Malnutrition Interventions: Resume TFWound:      Wounds Pressure injury Sacrum (Active) 12/01/23 2030   Wound Type: Pressure injury   Orientation:    Location: Sacrum   Wound Location Comments: DTI   Initial Wound Site Closure:    Initial Dressing Placed:    Initial Cycle:    Initial Suction Setting (mmHg):    Pressure Injury Stages: Deep tissue pressure injury   Pressure Injury Present Within 24 Hours of Hospital Admission: Yes   If This Pressure Injury Is Suspected to Be Device Related, Please Select the Device::    Is the Wound Open or Closed:    Image   12/02/23 1330   Wound Assessment Purple;Red;Moist 12/06/23 0800   Peri-wound Assessment Dry;Bruised 12/06/23 0800   Wound Drainage Amount None 12/06/23 0800   Wound Dressing Status None/open to air 12/06/23 0800   Wound Care Treatment or ointment applied 12/06/23 0800   Wound Dressing and/or Treatment A & D ointment 12/06/23 0800   Wound Status (Wound Team Only) Being Treated 12/02/23 1330   Wound Length (cm) (Wound Team Only) 7.5 cm 12/02/23 1330   Wound Width (cm) (Wound Team Only) 6 cm 12/02/23 1330   Wound Depth (cm) (Wound Team Only) 0.1 cm 12/02/23 1330   Wound Surface Area (cm^2) (Wound Team Only) 35.34 cm^2 12/02/23 1330   Wound Volume (cm^3) (Wound Team Only) 2.356 cm^3 12/02/23 1330   Number of days: 6       Wounds Pressure injury Left Heel (Active)   12/01/23 2030   Wound Type: Pressure injury   Orientation: Left   Location: Heel   Wound Location Comments: DTI   Initial Wound Site Closure:    Initial Dressing Placed:    Initial Cycle:    Initial Suction Setting (mmHg):    Pressure Injury Stages: Deep tissue pressure injury   Pressure Injury Present Within 24 Hours of Hospital Admission: Yes   If This Pressure Injury Is Suspected to Be Device Related, Please Select the Device::    Is the Wound Open or Closed:    Image   12/02/23 1330   Wound Assessment Dressing not removed for assessment 12/06/23 0800   Peri-wound Assessment Dry;Intact 12/06/23 0800   Wound Drainage Amount None 12/06/23 0800   Wound Dressing Status Intact 12/06/23 0800 Wound Care Dressing changed or new application 12/05/23 2000   Wound Dressing and/or Treatment Foam 12/06/23 0800   Dressing Change Due 2023-12-26 12/06/23 0800   Wound Status (Wound Team Only) Being Treated 12/02/23 1330   Wound Length (cm) (Wound Team Only) 4 cm 12/02/23 1330   Wound Width (cm) (Wound Team Only) 5 cm 12/02/23 1330   Wound  Depth (cm) (Wound Team Only) 0.1 cm 12/02/23 1330   Wound Surface Area (cm^2) (Wound Team Only) 15.71 cm^2 12/02/23 1330   Wound Volume (cm^3) (Wound Team Only) 1.047 cm^3 12/02/23 1330   Number of days: 6       Wounds Pressure injury Left Ischium (Active)   12/02/23 1330   Wound Type: Pressure injury   Orientation: Left   Location: Ischium   Wound Location Comments:    Initial Wound Site Closure:    Initial Dressing Placed:    Initial Cycle:    Initial Suction Setting (mmHg):    Pressure Injury Stages: Deep tissue pressure injury   Pressure Injury Present Within 24 Hours of Hospital Admission: Yes   If This Pressure Injury Is Suspected to Be Device Related, Please Select the Device::    Is the Wound Open or Closed:    Image   12/02/23 1330   Wound Assessment Red;Non-blanchable 12/06/23 0800   Peri-wound Assessment Intact 12/06/23 0800   Wound Drainage Amount None 12/06/23 0800   Wound Dressing Status None/open to air 12/06/23 0800   Wound Care Treatment or ointment applied 12/06/23 0800   Wound Dressing and/or Treatment A & D ointment 12/06/23 0800   Wound Status (Wound Team Only) Being Treated 12/02/23 1330   Wound Length (cm) (Wound Team Only) 5.5 cm 12/02/23 1330   Wound Width (cm) (Wound Team Only) 5 cm 12/02/23 1330   Wound Depth (cm) (Wound Team Only) 0.1 cm 12/02/23 1330   Wound Surface Area (cm^2) (Wound Team Only) 21.6 cm^2 12/02/23 1330   Wound Volume (cm^3) (Wound Team Only) 1.44 cm^3 12/02/23 1330   Number of days: 5                        Discharge Disposition, Condition   Patient Disposition: Morgue/Funeral Home      Condition at Discharge: expired    Signed:  Chauncey GORMAN Alderman, APRN-NP  12/27/2023      cc:  Primary Care Physician:  Bernardino Velma SAUNDERS  Verified    Referring physicians:  No ref. provider found   Additional provider(s):        Did we miss something? If additional records are needed, please fax a request on office letterhead to [phone]. Please include the patient's name, date of birth, fax number and type of information needed. Additional request can be made by email at [email or web address]. For general questions of information about electronic records sharing, call [phone].

## 2023-12-10 DEATH — deceased

## 2023-12-25 ENCOUNTER — Encounter: Admit: 2023-12-25 | Discharge: 2023-12-25 | Payer: MEDICARE
